# Patient Record
Sex: Female | Born: 1935 | Hispanic: No | Smoking: Never smoker
Health system: Southern US, Community
[De-identification: ages and names within clinical notes are randomized; demographics above are authoritative.]

## PROBLEM LIST (undated history)

## (undated) DIAGNOSIS — G250 Essential tremor: Secondary | ICD-10-CM

## (undated) DIAGNOSIS — E119 Type 2 diabetes mellitus without complications: Secondary | ICD-10-CM

## (undated) DIAGNOSIS — G609 Hereditary and idiopathic neuropathy, unspecified: Secondary | ICD-10-CM

## (undated) DIAGNOSIS — I639 Cerebral infarction, unspecified: Secondary | ICD-10-CM

## (undated) DIAGNOSIS — I10 Essential (primary) hypertension: Secondary | ICD-10-CM

## (undated) DIAGNOSIS — E7800 Pure hypercholesterolemia, unspecified: Secondary | ICD-10-CM

## (undated) DIAGNOSIS — I82409 Acute embolism and thrombosis of unspecified deep veins of unspecified lower extremity: Secondary | ICD-10-CM

## (undated) DIAGNOSIS — E669 Obesity, unspecified: Secondary | ICD-10-CM

## (undated) DIAGNOSIS — F419 Anxiety disorder, unspecified: Secondary | ICD-10-CM

## (undated) DIAGNOSIS — J189 Pneumonia, unspecified organism: Secondary | ICD-10-CM

## (undated) HISTORY — PX: CATARACT EXTRACTION: SUR2

## (undated) HISTORY — DX: Cerebral infarction, unspecified: I63.9

## (undated) HISTORY — DX: Hereditary and idiopathic neuropathy, unspecified: G60.9

## (undated) HISTORY — DX: Obesity, unspecified: E66.9

## (undated) HISTORY — DX: Anxiety disorder, unspecified: F41.9

## (undated) HISTORY — PX: FOOT SURGERY: SHX648

## (undated) HISTORY — DX: Essential tremor: G25.0

## (undated) HISTORY — DX: Type 2 diabetes mellitus without complications: E11.9

---

## 1997-11-25 DIAGNOSIS — E7800 Pure hypercholesterolemia, unspecified: Secondary | ICD-10-CM

## 1997-11-25 HISTORY — DX: Pure hypercholesterolemia, unspecified: E78.00

## 2000-09-24 ENCOUNTER — Encounter: Payer: Self-pay | Admitting: Internal Medicine

## 2000-09-24 ENCOUNTER — Encounter: Admission: RE | Admit: 2000-09-24 | Discharge: 2000-09-24 | Payer: Self-pay | Admitting: Internal Medicine

## 2000-10-02 ENCOUNTER — Other Ambulatory Visit: Admission: RE | Admit: 2000-10-02 | Discharge: 2000-10-02 | Payer: Self-pay | Admitting: Internal Medicine

## 2002-07-02 ENCOUNTER — Encounter: Payer: Self-pay | Admitting: Cardiovascular Disease

## 2002-07-02 ENCOUNTER — Ambulatory Visit (HOSPITAL_COMMUNITY): Admission: RE | Admit: 2002-07-02 | Discharge: 2002-07-02 | Payer: Self-pay | Admitting: Cardiovascular Disease

## 2002-07-05 ENCOUNTER — Encounter: Payer: Self-pay | Admitting: Internal Medicine

## 2002-07-05 ENCOUNTER — Encounter: Admission: RE | Admit: 2002-07-05 | Discharge: 2002-07-05 | Payer: Self-pay | Admitting: Internal Medicine

## 2002-07-14 ENCOUNTER — Encounter: Admission: RE | Admit: 2002-07-14 | Discharge: 2002-07-14 | Payer: Self-pay | Admitting: Internal Medicine

## 2002-07-14 ENCOUNTER — Encounter: Payer: Self-pay | Admitting: Internal Medicine

## 2002-07-16 ENCOUNTER — Ambulatory Visit (HOSPITAL_COMMUNITY): Admission: RE | Admit: 2002-07-16 | Discharge: 2002-07-16 | Payer: Self-pay | Admitting: Cardiovascular Disease

## 2004-03-14 ENCOUNTER — Encounter: Admission: RE | Admit: 2004-03-14 | Discharge: 2004-03-14 | Payer: Self-pay | Admitting: Internal Medicine

## 2004-03-28 ENCOUNTER — Encounter: Admission: RE | Admit: 2004-03-28 | Discharge: 2004-03-28 | Payer: Self-pay | Admitting: Cardiovascular Disease

## 2004-04-18 ENCOUNTER — Inpatient Hospital Stay (HOSPITAL_COMMUNITY): Admission: AD | Admit: 2004-04-18 | Discharge: 2004-04-19 | Payer: Self-pay | Admitting: Cardiovascular Disease

## 2004-05-09 ENCOUNTER — Encounter: Admission: RE | Admit: 2004-05-09 | Discharge: 2004-05-09 | Payer: Self-pay | Admitting: Internal Medicine

## 2005-10-07 ENCOUNTER — Encounter: Admission: RE | Admit: 2005-10-07 | Discharge: 2005-10-07 | Payer: Self-pay | Admitting: Internal Medicine

## 2005-12-01 ENCOUNTER — Emergency Department (HOSPITAL_COMMUNITY): Admission: EM | Admit: 2005-12-01 | Discharge: 2005-12-02 | Payer: Self-pay | Admitting: Emergency Medicine

## 2007-08-26 ENCOUNTER — Emergency Department (HOSPITAL_COMMUNITY): Admission: EM | Admit: 2007-08-26 | Discharge: 2007-08-27 | Payer: Self-pay | Admitting: Emergency Medicine

## 2007-10-06 ENCOUNTER — Encounter: Admission: RE | Admit: 2007-10-06 | Discharge: 2007-10-06 | Payer: Self-pay | Admitting: Internal Medicine

## 2008-07-05 ENCOUNTER — Encounter: Admission: RE | Admit: 2008-07-05 | Discharge: 2008-07-05 | Payer: Self-pay | Admitting: Cardiovascular Disease

## 2008-07-06 ENCOUNTER — Emergency Department (HOSPITAL_COMMUNITY): Admission: EM | Admit: 2008-07-06 | Discharge: 2008-07-06 | Payer: Self-pay | Admitting: Emergency Medicine

## 2008-10-11 ENCOUNTER — Encounter: Admission: RE | Admit: 2008-10-11 | Discharge: 2008-10-11 | Payer: Self-pay | Admitting: Internal Medicine

## 2010-12-08 ENCOUNTER — Encounter (INDEPENDENT_AMBULATORY_CARE_PROVIDER_SITE_OTHER): Payer: Self-pay | Admitting: Emergency Medicine

## 2010-12-08 ENCOUNTER — Inpatient Hospital Stay (HOSPITAL_COMMUNITY)
Admission: EM | Admit: 2010-12-08 | Discharge: 2010-12-10 | Payer: Self-pay | Source: Home / Self Care | Attending: Internal Medicine | Admitting: Internal Medicine

## 2010-12-10 LAB — CBC
HCT: 45.1 % (ref 36.0–46.0)
HCT: 43 % (ref 36.0–46.0)
Hemoglobin: 15.2 g/dL — ABNORMAL HIGH (ref 12.0–15.0)
Hemoglobin: 14.3 g/dL (ref 12.0–15.0)
MCH: 30.8 pg (ref 26.0–34.0)
MCH: 30.2 pg (ref 26.0–34.0)
MCHC: 33.7 g/dL (ref 30.0–36.0)
MCHC: 33.3 g/dL (ref 30.0–36.0)
MCV: 91.5 fL (ref 78.0–100.0)
MCV: 90.9 fL (ref 78.0–100.0)
Platelets: 127 10*3/uL — ABNORMAL LOW (ref 150–400)
Platelets: 123 10*3/uL — ABNORMAL LOW (ref 150–400)
RBC: 4.93 MIL/uL (ref 3.87–5.11)
RBC: 4.73 MIL/uL (ref 3.87–5.11)
RDW: 13.9 % (ref 11.5–15.5)
RDW: 13.8 % (ref 11.5–15.5)
WBC: 7.8 10*3/uL (ref 4.0–10.5)
WBC: 7.2 10*3/uL (ref 4.0–10.5)

## 2010-12-10 LAB — BASIC METABOLIC PANEL
BUN: 23 mg/dL (ref 6–23)
BUN: 20 mg/dL (ref 6–23)
CO2: 29 mEq/L (ref 19–32)
CO2: 29 mEq/L (ref 19–32)
Calcium: 9.3 mg/dL (ref 8.4–10.5)
Calcium: 8.8 mg/dL (ref 8.4–10.5)
Chloride: 103 mEq/L (ref 96–112)
Chloride: 104 mEq/L (ref 96–112)
Creatinine, Ser: 0.86 mg/dL (ref 0.4–1.2)
Creatinine, Ser: 0.88 mg/dL (ref 0.4–1.2)
GFR calc Af Amer: >60 mL/min (ref >60)
GFR calc Af Amer: >60 mL/min (ref >60)
GFR calc non Af Amer: >60 mL/min (ref >60)
GFR calc non Af Amer: >60 mL/min (ref >60)
Glucose, Bld: 107 mg/dL — ABNORMAL HIGH (ref 70–99)
Glucose, Bld: 98 mg/dL (ref 70–99)
Potassium: 4.3 mEq/L (ref 3.5–5.1)
Potassium: 4 mEq/L (ref 3.5–5.1)
Sodium: 140 mEq/L (ref 135–145)
Sodium: 140 mEq/L (ref 135–145)

## 2010-12-10 LAB — POCT I-STAT, CHEM 8
BUN: 26 mg/dL — ABNORMAL HIGH (ref 6–23)
Calcium, Ion: 1.14 mmol/L (ref 1.12–1.32)
Chloride: 104 mEq/L (ref 96–112)
Creatinine, Ser: 1.1 mg/dL (ref 0.4–1.2)
Glucose, Bld: 103 mg/dL — ABNORMAL HIGH (ref 70–99)
HCT: 46 % (ref 36.0–46.0)
Hemoglobin: 15.6 g/dL — ABNORMAL HIGH (ref 12.0–15.0)
Potassium: 4.2 mEq/L (ref 3.5–5.1)
Sodium: 140 mEq/L (ref 135–145)
TCO2: 32 mmol/L (ref 0–100)

## 2010-12-10 LAB — APTT: aPTT: 28 seconds (ref 24–37)

## 2010-12-10 LAB — PROTIME-INR
INR: 1.05 (ref 0.00–1.49)
INR: 1.12 (ref 0.00–1.49)
Prothrombin Time: 13.9 seconds (ref 11.6–15.2)
Prothrombin Time: 14.6 seconds (ref 11.6–15.2)

## 2010-12-10 LAB — DIFFERENTIAL
Basophils Absolute: 0.1 10*3/uL (ref 0.0–0.1)
Basophils Relative: 1 % (ref 0–1)
Eosinophils Absolute: 0.1 10*3/uL (ref 0.0–0.7)
Eosinophils Relative: 2 % (ref 0–5)
Lymphocytes Relative: 37 % (ref 12–46)
Lymphs Abs: 2.8 10*3/uL (ref 0.7–4.0)
Monocytes Absolute: 0.6 10*3/uL (ref 0.1–1.0)
Monocytes Relative: 8 % (ref 3–12)
Neutro Abs: 4.1 10*3/uL (ref 1.7–7.7)
Neutrophils Relative %: 53 % (ref 43–77)

## 2010-12-10 LAB — HOMOCYSTEINE: Homocysteine: 11.8 umol/L (ref 4.0–15.4)

## 2010-12-12 LAB — PROTEIN C ACTIVITY: Protein C Activity: 136 % — ABNORMAL HIGH (ref 75–133)

## 2010-12-12 LAB — LUPUS ANTICOAGULANT PANEL
DRVVT: 40.1 secs (ref 36.2–44.3)
Lupus Anticoagulant: NOT DETECTED
PTT Lupus Anticoagulant: 33.3 secs (ref 30.0–45.6)

## 2010-12-12 LAB — BETA-2-GLYCOPROTEIN I ABS, IGG/M/A
Beta-2 Glyco I IgG: 0 G Units (ref <20)
Beta-2-Glycoprotein I IgA: 9 A Units (ref <20)
Beta-2-Glycoprotein I IgM: 6 M Units (ref <20)

## 2010-12-12 LAB — CARDIOLIPIN ANTIBODIES, IGG, IGM, IGA
Anticardiolipin IgA: 3 APL U/mL — ABNORMAL LOW (ref <22)
Anticardiolipin IgG: 2 GPL U/mL — ABNORMAL LOW (ref <23)
Anticardiolipin IgM: 4 MPL U/mL — ABNORMAL LOW (ref <11)

## 2010-12-12 LAB — PROTIME-INR
INR: 1.17 (ref 0.00–1.49)
Prothrombin Time: 15.1 seconds (ref 11.6–15.2)

## 2010-12-12 LAB — ANTITHROMBIN III: AntiThromb III Func: 93 % (ref 76–126)

## 2010-12-12 LAB — PROTEIN S ACTIVITY: Protein S Activity: 72 % (ref 69–129)

## 2010-12-14 ENCOUNTER — Encounter
Admission: RE | Admit: 2010-12-14 | Discharge: 2010-12-14 | Payer: Self-pay | Source: Home / Self Care | Attending: Family Medicine | Admitting: Family Medicine

## 2010-12-17 LAB — FACTOR 5 LEIDEN

## 2011-03-08 ENCOUNTER — Encounter (HOSPITAL_COMMUNITY): Payer: Medicare Other | Attending: Rheumatology

## 2011-03-08 DIAGNOSIS — M810 Age-related osteoporosis without current pathological fracture: Secondary | ICD-10-CM | POA: Insufficient documentation

## 2011-04-12 NOTE — Cardiovascular Report (Signed)
   [name] [name], [name] [name]                        ACCOUNT NO.:  [id number]   MEDICAL RECORD NO.:  [id number]                   PATIENT TYPE:  OIB   LOCATION:  2899                                 FACILITY:  [facility]   PHYSICIAN:  Ricki Rodriguez, M.D.               DATE OF BIRTH:  1930/02/24   DATE OF PROCEDURE:  07/16/2002  DATE OF DISCHARGE:  07/16/2002                              CARDIAC CATHETERIZATION   PROCEDURE PERFORMED:  1. Left heart catheterization.  2. Selective coronary angiography.  3. Left ventriculography.  4. Descending renal aortography.   INDICATIONS FOR PROCEDURE:  A 77 year old middle [place] female with chest  pain and an abnormal nuclear stress test.   APPROACH:  Right femoral artery with a 6 French femoral diagnostic  catheters.   COMPLICATIONS:  None; however, SMART needle was used.   HEMODYNAMIC DATA:  1. The left ventricular pressure was 144/13 mmHg.  2. The aortic pressure 142 /59 mmHg.   CORONARY ANGIOGRAPHY:  1. The main coronary artery was short and unremarkable.  2. The left anterior descending coronary artery had limited irregularities     and had a small diagonal vessel with ostial 50% disease.  3. The left circumflex coronary artery was dominant and had luminal     irregularities.  The obtuse marginal branch one had ostial 60% stenosis;     however, it was less than 1 mm in diameter.  The obtuse marginal branch     two had ostial and proximal 20-30% stenosis.  4. The posterior descending coronary artery was unremarkable.  5. The right coronary artery was small with luminal irregularities.   LEFT VENTRICULOGRAPHY:  The left ventriculogram showed normal left  ventricular systolic function.  The ascending aorta looked somewhat ectatic.   RENAL AORTOGRAPHY:  The aortogram showed a normal renal arteries with  minimal distal aortic disease and proximal common iliac with 30% narrowing.   IMPRESSION:  1. Multivessel coronary artery disease  (mild).  2.     Normal left ventricular systolic function.  3. Normal renal arteries.   RECOMMENDATIONS:  This patient will be treated medically with Ricki Rodriguez, M.D.                                                Ricki Rodriguez, M.D.    ASK/MEDQ  D:  07/16/2002  T:  07/19/2002  Job:  [id number]   cc:   Cardiac Cath Lab

## 2011-04-12 NOTE — Discharge Summary (Signed)
[name] [name], [name] [name]                        ACCOUNT NO.:  [id number]   MEDICAL RECORD NO.:  [id number]                   PATIENT TYPE:  INP   LOCATION:  3706                                 FACILITY:  [facility]   PHYSICIAN:  Ricki Rodriguez, M.D.               DATE OF BIRTH:  Oct 26, 1930   DATE OF ADMISSION:  04/18/2004  DATE OF DISCHARGE:  04/19/2004                                 DISCHARGE SUMMARY   PRINCIPAL/FINAL DIAGNOSES:  1. Coronary atherosclerosis of native vessel.  2. Angina pectoris.  3. Hypertension.  4. Obesity.   PRINCIPAL PROCEDURE:  Left heart catheterization, selective coronary  angiography, left ventricular __________ done by  Dr. Orpah Cobb on Apr 19, 2004.   DISCHARGE MEDICATIONS:  The patient is to continue:  1. Aspirin 81 mg one p.o. daily.  2. Actonel 35 mg one p.o. daily.  3. Xanax 0.25 mg one twice daily.  4. Sonata 10 mg one daily.  5. Lipitor 20 mg one p.o. daily.  6. Tenormin 50 mg in the morning and 25 mg in the evening.  7. Vicodin 5/500 one twice daily.  8. Heating pad to lower extremity.   PAIN MANAGEMENT:  Use Vicodin as directed.   ACTIVITY:  As tolerated but sedentary for 2 days, then increase activity  gradually.   DIET:  Low fat, low salt diet.   WOUND CARE:  The patient to notify for right groin pain, swelling, or  discharge.   FOLLOW UP:  Follow up by Dr. Orpah Cobb in 2 weeks. The patient is to call  [phone] for appointment.   HISTORY:  This 77 year old [place] female had an episode chest pain  radiating to left arm along with shortness of breath and sweating spell.  Chest x-ray lasted for a few minutes. She had a past medical history of  hypertension, elevated cholesterol.   PHYSICAL EXAMINATION:  VITAL SIGNS:  Temperature 98.6, pulse 86,  respirations 20, blood pressure 130/65, height 5 feet 1 inch, weight 184  pounds.  GENERAL:  The patient was alert and oriented x3.  HEENT:  Head:  Normocephalic, atraumatic. Eyes:   Manson Passey. Pupils are equal,  round, and reactive to light. Extraocular movements intact. Conjunctivae  pink. Sclerae white. Ears, nose, and throat:  Mucous membranes are pink and  moist.  NECK:  No JVD. No carotid bruit.  LUNGS:  Clear bilaterally.  HEART:  Normal S1 and S2.  ABDOMEN:  Soft and nontender.  EXTREMITIES:  No edema, cyanosis, or clubbing.  CENTRAL NERVOUS SYSTEM:  Bilateral equal grips. The patient was right handed  and moved all four extremities.   LABORATORY DATA:  Hemoglobin 15.5, hematocrit 45.5, normal white blood cell  count and platelet count. Normal PT/PTT. Normal electrolytes, BUN and  creatinine. Sugar elevated at 151. EKG:  Normal sinus rhythm. Cardiac  catheterization showed mild multivessel coronary artery disease with a  normal left systolic function.  HOSPITAL COURSE:  The patient was admitted to telemetry unit. Myocardial  infarction was ruled out. She underwent cardiac catheterization that showed  mild multivessel coronary artery disease with normal left systolic function.  Hence, she was discharged home on Apr 19, 2004 in satisfactory condition  with follow up by me in 2 weeks.                                                Ricki Rodriguez, M.D.    ASK/MEDQ  D:  05/31/2004  T:  06/01/2004  Job:  [id number]

## 2011-04-12 NOTE — Cardiovascular Report (Signed)
[name], [name]                        ACCOUNT NO.:  [id number]   MEDICAL RECORD NO.:  [id number]                   PATIENT TYPE:  INP   LOCATION:  3706                                 FACILITY:  [facility]   PHYSICIAN:  Ricki Rodriguez, M.D.               DATE OF BIRTH:  17-Jan-1930   DATE OF PROCEDURE:  04/19/2004  DATE OF DISCHARGE:                              CARDIAC CATHETERIZATION   PROCEDURES PERFORMED:  1. Left heart catheterization.  2. Selective coronary angiography.  3. Left ventricular function study.   CARDIOLOGIST:  Ricki Rodriguez, M.D.   ACCESS:  Approach; right femoral artery using a 5 French sheath with no  complications and a Smart needle was used for arterial access.  No hematoma  postprocedure.   CONTRAST MATERIAL:  Less than 70 mL of dye used.   INDICATIONS:  This is a 77 year old [place] female with known coronary  artery disease who had recurrent chest pain.   HEMODYNAMIC DATA:  The left ventricular pressure was 130/50 and the aortic  pressure was 133/70.   VENTRICULOGRAPHIC DATA:  Left Ventriculogram:  The left ventriculogram  showed normal left ventricular systolic function with an ejection fraction  of 65%.   ANGIOGRAPHIC DATA:  Coronary Anatomy  Left Main Coronary Artery:  The left main coronary artery was short and  unremarkable.   Left Anterior Descending Coronary Artery:  The left anterior descending  coronary artery had mild proximal-to-mid 20-30% disease.  The rest of the  vessel was unremarkable.  It wrapped around the physical examination of the  heart.  The diagonal vessels were small vessels.   Left Circumflex Coronary Artery:  The left circumflex coronary artery was  also essentially unremarkable. Its ramus and obtuse marginal branch-1 and 3  were small vessels; and, the obtuse marginal branch-2 was unremarkable.  The  posterior descending coronary artery was relatively short and unremarkable.   Right Coronary Artery:  The right  coronary artery was nondominant and had  minimal diffuse disease in the right coronary artery as well as the marginal  branch.   IMPRESSION:  1. Mild multivessel native vessel coronary artery disease.  2. Normal left ventricular systolic function.   RECOMMENDATIONS:  The patient will be treated medically for noncardiac chest  pain.                                               Ricki Rodriguez, M.D.    ASK/MEDQ  D:  04/19/2004  T:  04/20/2004  Job:  [id number]

## 2011-08-23 LAB — POCT I-STAT, CHEM 8
Creatinine, Ser: 1
Glucose, Bld: 96
Hemoglobin: 15.6 — ABNORMAL HIGH
TCO2: 26

## 2011-08-23 LAB — DIFFERENTIAL
Eosinophils Absolute: 0.1
Eosinophils Relative: 1
Lymphs Abs: 2.6
Monocytes Absolute: 0.6
Monocytes Relative: 8
Neutrophils Relative %: 52

## 2011-08-23 LAB — URINALYSIS, ROUTINE W REFLEX MICROSCOPIC
Specific Gravity, Urine: 1.025
Urobilinogen, UA: 0.2
pH: 5.5

## 2011-08-23 LAB — CBC
Hemoglobin: 14.9
Platelets: 124 — ABNORMAL LOW
RBC: 5.04
RDW: 14.3

## 2011-08-23 LAB — URINE CULTURE: Colony Count: 45000

## 2011-09-05 LAB — CULTURE, BLOOD (ROUTINE X 2): Culture: NO GROWTH

## 2011-09-05 LAB — URINE MICROSCOPIC-ADD ON

## 2011-09-05 LAB — DIFFERENTIAL
Basophils Absolute: 0
Basophils Relative: 1
Lymphocytes Relative: 31
Neutro Abs: 4.8
Neutrophils Relative %: 60

## 2011-09-05 LAB — BASIC METABOLIC PANEL
BUN: 11
Calcium: 8.8
Creatinine, Ser: 0.74
GFR calc non Af Amer: >60
Glucose, Bld: 88
Sodium: 135

## 2011-09-05 LAB — URINALYSIS, ROUTINE W REFLEX MICROSCOPIC: Nitrite: NEGATIVE

## 2011-09-05 LAB — CBC
Platelets: 180
RDW: 12.8

## 2012-03-14 ENCOUNTER — Emergency Department (HOSPITAL_COMMUNITY)
Admission: EM | Admit: 2012-03-14 | Discharge: 2012-03-14 | Disposition: A | Discharge status: Home / Self Care | Payer: Medicare Other | Attending: Emergency Medicine | Admitting: Emergency Medicine

## 2012-03-14 DIAGNOSIS — F410 Panic disorder [episodic paroxysmal anxiety] without agoraphobia: Secondary | ICD-10-CM | POA: Insufficient documentation

## 2012-03-14 MED ORDER — LORAZEPAM 2 MG/ML IJ SOLN
1.0000 mg | Freq: Once | INTRAMUSCULAR | Status: AC
  Administered 2012-03-14: 1 mg via INTRAVENOUS
  Filled 2012-03-14: qty 1

## 2012-03-14 MED ORDER — LORAZEPAM 0.5 MG PO TABS: 0.5000 mg | ORAL_TABLET | Freq: Three times a day (TID) | ORAL | Status: AC | PRN

## 2012-03-14 NOTE — ED Notes (Signed)
Patient brought in via [facility] she is brought in with complaints of anxiety and a panic attack after seeing her son bleeding this morning. EMS personal advises that her son was drug by a dog this morning and was bleeding from abrasion and when she saw her son she had a panic attack. Alert and oriented she is currently having tremors.

## 2012-03-14 NOTE — ED Notes (Signed)
Discharge with her son with instructions discussed. NAD noted at the time of discharge. VS stable.

## 2012-03-14 NOTE — Discharge Instructions (Signed)
Anxiety and Panic Attacks Your caregiver has informed you that you are having an anxiety or panic attack. There may be many forms of this. Most of the time these attacks come suddenly and without warning. They come at any time of day, including periods of sleep, and at any time of life. They may be strong and unexplained. Although panic attacks are very scary, they are physically harmless. Sometimes the cause of your anxiety is not known. Anxiety is a protective mechanism of the body in its fight or flight mechanism. Most of these perceived danger situations are actually nonphysical situations (such as anxiety over losing a job). CAUSES  The causes of an anxiety or panic attack are many. Panic attacks may occur in otherwise healthy people given a certain set of circumstances. There may be a genetic cause for panic attacks. Some medications may also have anxiety as a side effect. SYMPTOMS  Some of the most common feelings are:  Intense terror.   Dizziness, feeling faint.   Hot and cold flashes.   Fear of going crazy.   Feelings that nothing is real.   Sweating.   Shaking.   Chest pain or a fast heartbeat (palpitations).   Smothering, choking sensations.   Feelings of impending doom and that death is near.   Tingling of extremities, this may be from over-breathing.   Altered reality (derealization).   Being detached from yourself (depersonalization).  Several symptoms can be present to make up anxiety or panic attacks. DIAGNOSIS  The evaluation by your caregiver will depend on the type of symptoms you are experiencing. The diagnosis of anxiety or panic attack is made when no physical illness can be determined to be a cause of the symptoms. TREATMENT  Treatment to prevent anxiety and panic attacks may include:  Avoidance of circumstances that cause anxiety.   Reassurance and relaxation.   Regular exercise.   Relaxation therapies, such as yoga.   Psychotherapy with a  psychiatrist or therapist.   Avoidance of caffeine, alcohol and illegal drugs.   Prescribed medication.  SEEK IMMEDIATE MEDICAL CARE IF:   You experience panic attack symptoms that are different than your usual symptoms.   You have any worsening or concerning symptoms.  Document Released: 11/11/2005 Document Revised: 10/31/2011 Document Reviewed: 03/15/2010 [employer] Patient Information 2012 [employer]  Lorazepam tablets What is this medicine? LORAZEPAM (lor A ze pam) is a benzodiazepine. It is used to treat anxiety. This medicine may be used for other purposes; ask your health care provider or pharmacist if you have questions. What should I tell my health care provider before I take this medicine? They need to know if you have any of these conditions: -alcohol or drug abuse problem -bipolar disorder, depression, psychosis or other mental health condition -glaucoma -kidney or liver disease -lung disease or breathing difficulties -myasthenia gravis -Parkinson's disease -seizures or a history of seizures -suicidal thoughts -an unusual or allergic reaction to lorazepam, other benzodiazepines, foods, dyes, or preservatives -pregnant or trying to get pregnant -breast-feeding How should I use this medicine? Take this medicine by mouth with a glass of water. Follow the directions on the prescription label. If it upsets your stomach, take it with food or milk. Take your medicine at regular intervals. Do not take it more often than directed. Do not stop taking except on the advice of your doctor or health care professional. Talk to your pediatrician regarding the use of this medicine in children. Special care may be needed. Overdosage: If you   think you have taken too much of this medicine contact a poison control center or emergency room at once. NOTE: This medicine is only for you. Do not share this medicine with others. What if I miss a dose? If you miss a dose, take it as soon as  you can. If it is almost time for your next dose, take only that dose. Do not take double or extra doses. What may interact with this medicine? -barbiturate medicines for inducing sleep or treating seizures, like phenobarbital -clozapine -medicines for depression, mental problems or psychiatric disturbances -medicines for sleep -phenytoin -probenecid -theophylline -valproic acid This list may not describe all possible interactions. Give your health care provider a list of all the medicines, herbs, non-prescription drugs, or dietary supplements you use. Also tell them if you smoke, drink alcohol, or use illegal drugs. Some items may interact with your medicine. What should I watch for while using this medicine? Visit your doctor or health care professional for regular checks on your progress. Your body may become dependent on this medicine, ask your doctor or health care professional if you still need to take it. However, if you have been taking this medicine regularly for some time, do not suddenly stop taking it. You must gradually reduce the dose or you may get severe side effects. Ask your doctor or health care professional for advice before increasing or decreasing the dose. Even after you stop taking this medicine it can still affect your body for several days. You may get drowsy or dizzy. Do not drive, use machinery, or do anything that needs mental alertness until you know how this medicine affects you. To reduce the risk of dizzy and fainting spells, do not stand or sit up quickly, especially if you are an older patient. Alcohol may increase dizziness and drowsiness. Avoid alcoholic drinks. Do not treat yourself for coughs, colds or allergies without asking your doctor or health care professional for advice. Some ingredients can increase possible side effects. What side effects may I notice from receiving this medicine? Side effects that you should report to your doctor or health care  professional as soon as possible: -changes in vision -confusion -depression -mood changes, excitability or aggressive behavior -movement difficulty, staggering or jerky movements -muscle cramps -restlessness -weakness or tiredness Side effects that usually do not require medical attention (report to your doctor or health care professional if they continue or are bothersome): -constipation or diarrhea -difficulty sleeping, nightmares -dizziness, drowsiness -headache -nausea, vomiting This list may not describe all possible side effects. Call your doctor for medical advice about side effects. You may report side effects to FDA at 1-800-FDA-1088. Where should I keep my medicine? Keep out of the reach of children. This medicine can be abused. Keep your medicine in a safe place to protect it from theft. Do not share this medicine with anyone. Selling or giving away this medicine is dangerous and against the law. Store at room temperature between 20 and 25 degrees C (68 and 77 degrees F). Protect from light. Keep container tightly closed. Throw away any unused medicine after the expiration date. NOTE: This sheet is a summary. It may not cover all possible information. If you have questions about this medicine, talk to your doctor, pharmacist, or health care provider.  2012, [employer]. (05/13/2008 2:58:20 PM) 

## 2012-03-14 NOTE — ED Provider Notes (Signed)
History     CSN: [id number]  Arrival date & time 03/14/12  0820   First MD Initiated Contact with Patient 03/14/12 [phone]      Chief Complaint  Patient presents with  . Panic Attack    (Consider location/radiation/quality/duration/timing/severity/associated sxs/prior treatment) The history is provided by the patient and the EMS personnel. The history is limited by the condition of the patient (anxiety).   78 year old female was brought in by ambulance after becoming very upset when she saw her son and drug by a dog and bleeding from abrasions. She is only able to speak in one or 2 words sentences. She is having difficulty answering even simple questions making further history very difficult to obtain. She denies chest pain and dyspnea.  No past medical history on file.  No past surgical history on file.  No family history on file.  History  Substance Use Topics  . Smoking status: Not on file  . Smokeless tobacco: Not on file  . Alcohol Use: Not on file    OB History    No data available      Review of Systems  Unable to perform ROS: Other    Allergies  Review of patient's allergies indicates no known allergies.  Home Medications   Current Outpatient Rx  Name Route Sig Dispense Refill  . ALPRAZOLAM 0.25 MG PO TABS Oral Take 0.25 mg by mouth 2 (two) times daily.    . ASPIRIN 81 MG PO TABS Oral Take 81 mg by mouth daily.    . ATENOLOL 50 MG PO TABS Oral Take 25 mg by mouth 2 (two) times daily.    . ATORVASTATIN CALCIUM 20 MG PO TABS Oral Take 20 mg by mouth daily.    [name] DILTIAZEM HCL 30 MG PO TABS Oral Take 30 mg by mouth 2 (two) times daily.    . FUROSEMIDE 20 MG PO TABS Oral Take 20 mg by mouth daily.    [name] GABAPENTIN 100 MG PO CAPS Oral Take 100 mg by mouth every evening.    [name] POTASSIUM CHLORIDE CRYS ER 10 MEQ PO TBCR Oral Take 10 mEq by mouth daily.    [name] PRIMIDONE 250 MG PO TABS Oral Take 125 mg by mouth at bedtime.    . TRAMADOL HCL 50 MG PO TABS Oral Take 50 mg  by mouth at bedtime.    . WARFARIN SODIUM 5 MG PO TABS Oral Take 5 mg by mouth daily.      BP 148/61  Pulse 73  Temp(Src) 97.8 F (36.6 C) (Oral)  Resp 18  SpO2 100%  Physical Exam  Nursing note and vitals reviewed.  78 year old female who appears for anxious but in no acute distress. She is having intermittent tremors which are worse on the right side. Vital signs are normal except for mild hypertension with blood pressure 148/61. Oxygen saturation is 98% which is normal. Head is normocephalic and atraumatic. PERRLA, EOMI. Oropharynx is clear. Neck is nontender and supple. Back is nontender. Lungs are clear without rales, wheezes, or rhonchi. Heart has regular rate and rhythm without murmur. Abdomen is soft, flat, nontender without masses or hepatosplenomegaly. Extremities have no cyanosis or edema, full range of motion is present. Skin is warm and dry without rash. Neurologic: Cranial nerves are intact, there no focal motor or sensory deficits. Intermittent tremor is present which is worse on the right. Psychiatric is significant for anxiety.  ED Course  Procedures (including critical care time)  She was given a  dose of lorazepam and felt much better although she noted that she was sleepy. He'll be discharged with a prescription for lorazepam but at a lower dose than what she was given in the emergency department.  1. Panic attack       MDM  Anxiety/panic attack. She will be given dose of Ativan and observed.        Dione Booze, MD 03/14/12 1031

## 2012-03-18 ENCOUNTER — Other Ambulatory Visit (HOSPITAL_COMMUNITY): Payer: Self-pay | Admitting: *Deleted

## 2012-03-19 ENCOUNTER — Encounter (HOSPITAL_COMMUNITY)
Admission: RE | Admit: 2012-03-19 | Discharge: 2012-03-19 | Disposition: A | Discharge status: Home / Self Care | Payer: Medicare Other | Source: Ambulatory Visit | Attending: Rheumatology | Admitting: Rheumatology

## 2012-03-19 DIAGNOSIS — M810 Age-related osteoporosis without current pathological fracture: Secondary | ICD-10-CM | POA: Insufficient documentation

## 2012-03-19 MED ORDER — ZOLEDRONIC ACID 5 MG/100ML IV SOLN
5.0000 mg | Freq: Once | INTRAVENOUS | Status: AC
  Administered 2012-03-19: 5 mg via INTRAVENOUS
  Filled 2012-03-19: qty 100

## 2012-03-20 ENCOUNTER — Encounter (HOSPITAL_COMMUNITY): Payer: Medicare Other

## 2012-03-23 ENCOUNTER — Emergency Department (HOSPITAL_COMMUNITY)
Admission: EM | Admit: 2012-03-23 | Discharge: 2012-03-24 | Disposition: A | Discharge status: Home / Self Care | Payer: Medicare Other | Attending: Emergency Medicine | Admitting: Emergency Medicine

## 2012-03-23 ENCOUNTER — Encounter (HOSPITAL_COMMUNITY): Payer: Self-pay | Admitting: Emergency Medicine

## 2012-03-23 DIAGNOSIS — M546 Pain in thoracic spine: Secondary | ICD-10-CM | POA: Insufficient documentation

## 2012-03-23 DIAGNOSIS — Z7982 Long term (current) use of aspirin: Secondary | ICD-10-CM | POA: Insufficient documentation

## 2012-03-23 DIAGNOSIS — J189 Pneumonia, unspecified organism: Secondary | ICD-10-CM | POA: Insufficient documentation

## 2012-03-23 DIAGNOSIS — M545 Low back pain, unspecified: Secondary | ICD-10-CM | POA: Insufficient documentation

## 2012-03-23 DIAGNOSIS — R0602 Shortness of breath: Secondary | ICD-10-CM | POA: Insufficient documentation

## 2012-03-23 DIAGNOSIS — R05 Cough: Secondary | ICD-10-CM | POA: Insufficient documentation

## 2012-03-23 DIAGNOSIS — I10 Essential (primary) hypertension: Secondary | ICD-10-CM | POA: Insufficient documentation

## 2012-03-23 DIAGNOSIS — R059 Cough, unspecified: Secondary | ICD-10-CM | POA: Insufficient documentation

## 2012-03-23 DIAGNOSIS — R6883 Chills (without fever): Secondary | ICD-10-CM | POA: Insufficient documentation

## 2012-03-23 DIAGNOSIS — E789 Disorder of lipoprotein metabolism, unspecified: Secondary | ICD-10-CM | POA: Insufficient documentation

## 2012-03-23 DIAGNOSIS — M549 Dorsalgia, unspecified: Secondary | ICD-10-CM

## 2012-03-23 DIAGNOSIS — Z86718 Personal history of other venous thrombosis and embolism: Secondary | ICD-10-CM | POA: Insufficient documentation

## 2012-03-23 DIAGNOSIS — R079 Chest pain, unspecified: Secondary | ICD-10-CM | POA: Insufficient documentation

## 2012-03-23 DIAGNOSIS — Z79899 Other long term (current) drug therapy: Secondary | ICD-10-CM | POA: Insufficient documentation

## 2012-03-23 DIAGNOSIS — Z7901 Long term (current) use of anticoagulants: Secondary | ICD-10-CM | POA: Insufficient documentation

## 2012-03-23 DIAGNOSIS — R109 Unspecified abdominal pain: Secondary | ICD-10-CM | POA: Insufficient documentation

## 2012-03-23 HISTORY — DX: Pure hypercholesterolemia, unspecified: E78.00

## 2012-03-23 HISTORY — DX: Pneumonia, unspecified organism: J18.9

## 2012-03-23 HISTORY — DX: Essential (primary) hypertension: I10

## 2012-03-23 HISTORY — DX: Acute embolism and thrombosis of unspecified deep veins of unspecified lower extremity: I82.409

## 2012-03-23 NOTE — ED Notes (Signed)
Family states for the past couple days she has been shaky like she has a cold or something and she has been c/o pain in her left flank area  Pt states when she wakes up the pain is sharp in nature  Pt states nothing makes the pain better or worse

## 2012-03-24 ENCOUNTER — Emergency Department (HOSPITAL_COMMUNITY): Payer: Medicare Other

## 2012-03-24 LAB — PROTIME-INR: INR: 2.35 — ABNORMAL HIGH (ref 0.00–1.49)

## 2012-03-24 LAB — CBC
HCT: 45.9 % (ref 36.0–46.0)
Hemoglobin: 15.3 g/dL — ABNORMAL HIGH (ref 12.0–15.0)
MCH: 29.9 pg (ref 26.0–34.0)
MCHC: 33.3 g/dL (ref 30.0–36.0)
RBC: 5.12 MIL/uL — ABNORMAL HIGH (ref 3.87–5.11)

## 2012-03-24 LAB — BASIC METABOLIC PANEL
BUN: 13 mg/dL (ref 6–23)
CO2: 28 mEq/L (ref 19–32)
Calcium: 8.6 mg/dL (ref 8.4–10.5)
GFR calc non Af Amer: 64 mL/min — ABNORMAL LOW (ref >90)
Glucose, Bld: 183 mg/dL — ABNORMAL HIGH (ref 70–99)
Potassium: 3.9 mEq/L (ref 3.5–5.1)

## 2012-03-24 LAB — TROPONIN I: Troponin I: <0.3 ng/mL (ref <0.30)

## 2012-03-24 MED ORDER — MOXIFLOXACIN HCL 400 MG PO TABS
400.0000 mg | ORAL_TABLET | Freq: Once | ORAL | Status: AC
  Administered 2012-03-24: 400 mg via ORAL
  Filled 2012-03-24: qty 1

## 2012-03-24 MED ORDER — OXYCODONE-ACETAMINOPHEN 5-325 MG PO TABS: 1.0000 | ORAL_TABLET | ORAL | Status: AC | PRN

## 2012-03-24 MED ORDER — MORPHINE SULFATE 4 MG/ML IJ SOLN
6.0000 mg | Freq: Once | INTRAMUSCULAR | Status: AC
  Administered 2012-03-24: 6 mg via INTRAVENOUS
  Filled 2012-03-24: qty 2

## 2012-03-24 MED ORDER — MOXIFLOXACIN HCL 400 MG PO TABS: 400.0000 mg | ORAL_TABLET | Freq: Every day | ORAL | Status: AC

## 2012-03-24 MED ORDER — OXYCODONE-ACETAMINOPHEN 5-325 MG PO TABS
1.0000 | ORAL_TABLET | Freq: Once | ORAL | Status: AC
  Administered 2012-03-24: 1 via ORAL
  Filled 2012-03-24: qty 1

## 2012-03-24 NOTE — ED Provider Notes (Signed)
History     CSN: [id number]  Arrival date & time 03/23/12  2158   First MD Initiated Contact with Patient 03/24/12 0028      Chief Complaint  Patient presents with  . Flank Pain    (Consider location/radiation/quality/duration/timing/severity/associated sxs/prior treatment) The history is provided by the patient.   the patient reports pain and discomfort in her left upper back.  She also reports cough and chills over the past several days.  She has a shortness of breath at this time.  She denies new rash.  She denies unilateral leg swelling.  She has a history of DVT and is currently on Coumadin.  There is no pleuritic component to her pain.  Her symptoms are worsened by movement and palpation.  Nothing improves her pain.  She has not tried any pain medication at home as she is afraid to try medications given her Coumadin use.  Her pain is mild to moderate at this time  Past Medical History  Diagnosis Date  . Hypertension   . High cholesterol   . DVT (deep venous thrombosis)   . Pneumonia     Past Surgical History  Procedure Date  . Foot surgery     Family History  Problem Relation Age of Onset  . Coronary artery disease Other     History  Substance Use Topics  . Smoking status: Never Smoker   . Smokeless tobacco: Not on file  . Alcohol Use: No    OB History    Grav Para Term Preterm Abortions TAB SAB Ect Mult Living                  Review of Systems  All other systems reviewed and are negative.    Allergies  Review of patient's allergies indicates no known allergies.  Home Medications   Current Outpatient Rx  Name Route Sig Dispense Refill  . ALPRAZOLAM 0.25 MG PO TABS Oral Take 0.25 mg by mouth 2 (two) times daily.    . ASPIRIN 81 MG PO TABS Oral Take 81 mg by mouth daily.    . ATENOLOL 50 MG PO TABS Oral Take 25 mg by mouth 2 (two) times daily.    . ATORVASTATIN CALCIUM 20 MG PO TABS Oral Take 20 mg by mouth daily.    [name] DILTIAZEM HCL 30 MG PO TABS  Oral Take 30 mg by mouth 2 (two) times daily.    . FUROSEMIDE 20 MG PO TABS Oral Take 20 mg by mouth daily.    [name] GABAPENTIN 100 MG PO CAPS Oral Take 100 mg by mouth every evening.    [name] LORAZEPAM 0.5 MG PO TABS Oral Take 1 tablet (0.5 mg total) by mouth 3 (three) times daily as needed for anxiety. 15 tablet 0  . POTASSIUM CHLORIDE CRYS ER 10 MEQ PO TBCR Oral Take 10 mEq by mouth daily.    [name] PRIMIDONE 250 MG PO TABS Oral Take 125 mg by mouth at bedtime.    . TRAMADOL HCL 50 MG PO TABS Oral Take 50 mg by mouth at bedtime.    . WARFARIN SODIUM 5 MG PO TABS Oral Take 5 mg by mouth daily.      BP 125/52  Pulse 73  Temp(Src) 97.6 F (36.4 C) (Oral)  Resp 16  SpO2 97%  Physical Exam  Nursing note and vitals reviewed. Constitutional: She is oriented to person, place, and time. She appears well-developed and well-nourished. No distress.  HENT:  Head: Normocephalic and  atraumatic.  Eyes: EOM are normal.  Neck: Normal range of motion.  Cardiovascular: Normal rate, regular rhythm and normal heart sounds.   Pulmonary/Chest: Effort normal and breath sounds normal.  Abdominal: Soft. She exhibits no distension. There is no tenderness.  Musculoskeletal: Normal range of motion.       Mild tenderness of left parathoracic and paralumbar regions.  No evidence of zoster.  No erythema.  No ecchymosis.  Neurological: She is alert and oriented to person, place, and time.  Skin: Skin is warm and dry.  Psychiatric: She has a normal mood and affect. Judgment normal.    ED Course  Procedures (including critical care time)  Labs Reviewed  CBC - Abnormal; Notable for the following:    RBC 5.12 (*)    Hemoglobin 15.3 (*)    Platelets 132 (*)    All other components within normal limits  BASIC METABOLIC PANEL - Abnormal; Notable for the following:    Glucose, Bld 183 (*)    GFR calc non Af Amer 64 (*)    GFR calc Af Amer 75 (*)    All other components within normal limits  PROTIME-INR - Abnormal;  Notable for the following:    Prothrombin Time 26.1 (*)    INR 2.35 (*)    All other components within normal limits  TROPONIN I  URINALYSIS, WITH MICROSCOPIC   Dg Chest 2 View  03/24/2012  *RADIOLOGY REPORT*  Clinical Data: Upper back pain radiating to the chest.  CHEST - 2 VIEW  Comparison: 12/14/2010  Findings: Cardiomegaly.  Central vascular congestion.  Aortic arch atherosclerosis.  Mild retrocardiac opacities.  Mild interstitial prominence.  No pneumothorax.  No pleural effusion.  Multilevel degenerative changes.  No acute osseous abnormality identified.  IMPRESSION: Cardiomegaly with mild interstitial prominence.  Mild retrocardiac opacity; atelectasis versus infiltrate.  Original Report Authenticated By: Waneta Martins, M.D.   Dg Lumbar Spine Complete  03/24/2012  *RADIOLOGY REPORT*  Clinical Data: Upper back pain, radiating to the chest.  LUMBAR SPINE - COMPLETE 4+ VIEW  Comparison: 08/12 1009 CT  Findings: Multilevel degenerative changes.  Most pronounced at T11- 12 and L5-S1.  There is mild height loss at T11 and T12, similar to prior.  Multilevel vacuum disc phenomenon.  Advanced atherosclerotic calcification.  No acute fracture or dislocation. No aggressive osseous abnormality. SI joints intact.  IMPRESSION: Multilevel degenerative changes.  No acute osseous abnormality identified.  Original Report Authenticated By: Waneta Martins, M.D.     1. CAP (community acquired pneumonia)   2. Back pain       MDM  The patient's symptoms include chills cough and left-sided back pain with evidence of retrocardiac opacity on chest x-ray.  I suspect this is pneumonia with associated pleurisy.  Avelox was given.  The patient at this time is not hypoxic.  She is well appearing.  I think she is safe for discharge home from the emergency department.  She will follow up closely with her doctor.  She and her husband have been advised to keep a close eye on her Coumadin INR as she will be  started on Avelox.  They were told the importance of following up at the Coumadin clinic.  They understand to return the emergency department for new or worsening symptoms        Lyanne Co, MD 03/24/12 [phone]

## 2012-03-24 NOTE — Discharge Instructions (Signed)
All medications can alter your Coumadin level.  Please follow up closely with your Coumadin clinic as you will be on antibiotics.

## 2012-03-24 NOTE — Progress Notes (Signed)
ANTICOAGULATION CONSULT NOTE - Initial Consult  Pharmacy Consult for Warfarin Indication: Hx of  DVT/PE  No Known Allergies  Patient Measurements:  Ht:  152 cm Wt:  73 kg   Vital Signs: Temp: 97.6 F (36.4 C) (04/29 2219) Temp src: Oral (04/29 2219) BP: 125/52 mmHg (04/29 2219) Pulse Rate: 73  (04/29 2219)  Labs:  Basename 03/24/12 0130 03/23/12 2327  HGB -- 15.3*  HCT -- 45.9  PLT -- 132*  APTT -- --  LABPROT 26.1* --  INR 2.35* --  HEPARINUNFRC -- --  CREATININE -- 0.84  CKTOTAL -- --  CKMB -- --  TROPONINI <0.30 --   The CrCl is unknown because both a height and weight (above a minimum accepted value) are required for this calculation.  Medical History: Past Medical History  Diagnosis Date  . Hypertension   . High cholesterol   . DVT (deep venous thrombosis)   . Pneumonia     Medications:  Scheduled:    .  morphine injection  6 mg Intravenous Once  . moxifloxacin  400 mg Oral Once   Infusions:    Assessment: 78 yo admitted with pain in left flank area, on chronic coumadin for hx of DVT and PE  (1/12)  Goal of Therapy:  INR 2-3   Plan:   Daily PT/INR.  Education.  Susanne Greenhouse R 03/24/2012,2:38 AM

## 2012-03-24 NOTE — ED Notes (Signed)
Pt states that she cannot urinate

## 2012-04-09 ENCOUNTER — Other Ambulatory Visit: Payer: Self-pay | Admitting: Nurse Practitioner

## 2012-04-09 ENCOUNTER — Ambulatory Visit
Admission: RE | Admit: 2012-04-09 | Discharge: 2012-04-09 | Disposition: A | Discharge status: Home / Self Care | Payer: Medicare Other | Source: Ambulatory Visit | Attending: Nurse Practitioner | Admitting: Nurse Practitioner

## 2012-04-09 DIAGNOSIS — R768 Other specified abnormal immunological findings in serum: Secondary | ICD-10-CM

## 2012-04-21 ENCOUNTER — Other Ambulatory Visit: Payer: Self-pay | Admitting: Nurse Practitioner

## 2012-04-21 DIAGNOSIS — M79605 Pain in left leg: Secondary | ICD-10-CM

## 2012-04-30 ENCOUNTER — Ambulatory Visit
Admission: RE | Admit: 2012-04-30 | Discharge: 2012-04-30 | Disposition: A | Discharge status: Home / Self Care | Payer: Medicare Other | Source: Ambulatory Visit | Attending: Nurse Practitioner | Admitting: Nurse Practitioner

## 2012-04-30 DIAGNOSIS — M79605 Pain in left leg: Secondary | ICD-10-CM

## 2012-05-22 ENCOUNTER — Emergency Department (HOSPITAL_COMMUNITY)
Admission: EM | Admit: 2012-05-22 | Discharge: 2012-05-23 | Disposition: A | Discharge status: Home / Self Care | Payer: Medicare Other | Attending: Emergency Medicine | Admitting: Emergency Medicine

## 2012-05-22 ENCOUNTER — Encounter (HOSPITAL_COMMUNITY): Payer: Self-pay | Admitting: Family Medicine

## 2012-05-22 DIAGNOSIS — Z8701 Personal history of pneumonia (recurrent): Secondary | ICD-10-CM | POA: Insufficient documentation

## 2012-05-22 DIAGNOSIS — M549 Dorsalgia, unspecified: Secondary | ICD-10-CM

## 2012-05-22 DIAGNOSIS — E7800 Pure hypercholesterolemia, unspecified: Secondary | ICD-10-CM | POA: Insufficient documentation

## 2012-05-22 DIAGNOSIS — Z7901 Long term (current) use of anticoagulants: Secondary | ICD-10-CM | POA: Insufficient documentation

## 2012-05-22 DIAGNOSIS — Z8249 Family history of ischemic heart disease and other diseases of the circulatory system: Secondary | ICD-10-CM | POA: Insufficient documentation

## 2012-05-22 DIAGNOSIS — M545 Low back pain, unspecified: Secondary | ICD-10-CM | POA: Insufficient documentation

## 2012-05-22 DIAGNOSIS — I10 Essential (primary) hypertension: Secondary | ICD-10-CM | POA: Insufficient documentation

## 2012-05-22 DIAGNOSIS — Z86718 Personal history of other venous thrombosis and embolism: Secondary | ICD-10-CM | POA: Insufficient documentation

## 2012-05-22 MED ORDER — DIAZEPAM 5 MG PO TABS
2.5000 mg | ORAL_TABLET | Freq: Once | ORAL | Status: AC
  Administered 2012-05-22: 2.5 mg via ORAL
  Filled 2012-05-22: qty 1

## 2012-05-22 MED ORDER — HYDROMORPHONE HCL PF 1 MG/ML IJ SOLN
1.0000 mg | Freq: Once | INTRAMUSCULAR | Status: AC
  Administered 2012-05-22: 1 mg via INTRAMUSCULAR
  Filled 2012-05-22: qty 1

## 2012-05-22 MED ORDER — IBUPROFEN 200 MG PO TABS
400.0000 mg | ORAL_TABLET | Freq: Once | ORAL | Status: AC
  Administered 2012-05-22: 400 mg via ORAL
  Filled 2012-05-22: qty 2

## 2012-05-22 MED ORDER — DEXAMETHASONE 4 MG PO TABS
10.0000 mg | ORAL_TABLET | Freq: Once | ORAL | Status: AC
  Administered 2012-05-22: 10 mg via ORAL
  Filled 2012-05-22: qty 2.5

## 2012-05-22 NOTE — ED Notes (Signed)
Reports lower right back pain and right leg pain x1 week. Denies injury; denies urinary problems. Reports pain is worse with sitting and lying down.

## 2012-05-22 NOTE — ED Provider Notes (Signed)
History    81yF with back pain. Gradual onset almost a week ago and progressively worse. Ache at rest and worse with movement, particularly when sitting and bending forward. Denies trauma. No hx of similar. No numbness, tingling or loss of strength. No urinary complaints. No fever or chills. Denies hx of back surgery, malignancy or drug use. Is on coumadin for hx of dvt.   CSN: [id number]  Arrival date & time 05/22/12  1805   First MD Initiated Contact with Patient 05/22/12 2137      Chief Complaint  Patient presents with  . Back Pain    (Consider location/radiation/quality/duration/timing/severity/associated sxs/prior treatment) HPI  Past Medical History  Diagnosis Date  . Hypertension   . High cholesterol   . DVT (deep venous thrombosis)   . Pneumonia     Past Surgical History  Procedure Date  . Foot surgery     Family History  Problem Relation Age of Onset  . Coronary artery disease Other     History  Substance Use Topics  . Smoking status: Never Smoker   . Smokeless tobacco: Not on file  . Alcohol Use: No    OB History    Grav Para Term Preterm Abortions TAB SAB Ect Mult Living                  Review of Systems   Review of symptoms negative unless otherwise noted in HPI.   Allergies  Review of patient's allergies indicates no known allergies.  Home Medications   Current Outpatient Rx  Name Route Sig Dispense Refill  . ALPRAZOLAM 0.25 MG PO TABS Oral Take 0.25 mg by mouth 2 (two) times daily.    . ASPIRIN 81 MG PO TABS Oral Take 81 mg by mouth daily.    . ATENOLOL 50 MG PO TABS Oral Take 25 mg by mouth 2 (two) times daily.    . ATORVASTATIN CALCIUM 20 MG PO TABS Oral Take 20 mg by mouth daily.    [name] DILTIAZEM HCL 30 MG PO TABS Oral Take 30 mg by mouth 2 (two) times daily.    . FUROSEMIDE 20 MG PO TABS Oral Take 20 mg by mouth daily.    [name] GABAPENTIN 100 MG PO CAPS Oral Take 100 mg by mouth every evening.    [name] POTASSIUM CHLORIDE CRYS ER 10 MEQ PO  TBCR Oral Take 10 mEq by mouth daily.    [name] PRIMIDONE 250 MG PO TABS Oral Take 125 mg by mouth at bedtime.    . TRAMADOL HCL 50 MG PO TABS Oral Take 50 mg by mouth at bedtime.    . WARFARIN SODIUM 5 MG PO TABS Oral Take 5-7.5 mg by mouth daily.       BP 131/46  Pulse 53  Temp 98.5 F (36.9 C) (Oral)  Resp 16  SpO2 95%  Physical Exam  Nursing note and vitals reviewed. Constitutional: She appears well-developed and well-nourished.       Laying in bed. Mildly uncomfortable appearing but not toxic.  HENT:  Head: Normocephalic and atraumatic.  Eyes: Conjunctivae are normal. Right eye exhibits no discharge. Left eye exhibits no discharge.  Neck: Neck supple.  Cardiovascular: Normal rate, regular rhythm and normal heart sounds.  Exam reveals no gallop and no friction rub.   No murmur heard. Pulmonary/Chest: Effort normal and breath sounds normal. No respiratory distress.  Abdominal: Soft. She exhibits no distension. There is no tenderness.  Genitourinary:       No  cva tenderness.  Musculoskeletal: She exhibits no edema and no tenderness.       Back:       Arms:      Legs:      Tenderness R lower back on buttock. No overlying skin changes. Positive straight leg test. NO tenderness on L as depicted in graphic, but cannot figure out how to change.  Neurological: She is alert.       Strength 5/5 b/l le. Sensation intact to light touch. Patellar reflexes 1+.  Skin: Skin is warm and dry.  Psychiatric: She has a normal mood and affect. Her behavior is normal. Thought content normal.    ED Course  Procedures (including critical care time)  Labs Reviewed - No data to display No results found.   1. Back pain       MDM  81yF w/ back pain. Suspect sciatica. Gradual onset. There is no history of acute trauma. Pt denies hx of CA to suggest possible mets or pathologic fracture. Patient is afebrile. Denies hx of IV drug use. Doubt spinal epidural abscess or vertebral  osteomyelitis/discitis. There is no clinical evidence of spinal cord impingement. She has no acute neurological complaints and has a nonfocal neurological examination. No urinary symptoms to suggest UTI. Doubt ureteral colic. NO hx of back surgery. Pt is on coumadin for a history of dvt. Consider spinal epidural hematoma but doubt.  Doubt AAA or dissection. No overlying skin changes to suggest shingles. Do not feel imaging is indicated at this time. Pt feeling better after meds and no new complaints prior to discharge. Plan continued symptomatic tx. Return precautions discussed. Outpt fu.             Raeford Razor, MD 05/23/12 [phone]

## 2012-05-23 MED ORDER — DEXAMETHASONE 4 MG PO TABS: 4.0000 mg | ORAL_TABLET | Freq: Two times a day (BID) | ORAL | Status: AC

## 2012-05-23 MED ORDER — DIAZEPAM 5 MG PO TABS: 5.0000 mg | ORAL_TABLET | Freq: Three times a day (TID) | ORAL | Status: AC | PRN

## 2012-05-23 MED ORDER — OXYCODONE-ACETAMINOPHEN 5-325 MG PO TABS: 1.0000 | ORAL_TABLET | ORAL | Status: AC | PRN

## 2012-05-23 MED ORDER — HYDROMORPHONE HCL PF 1 MG/ML IJ SOLN
0.5000 mg | Freq: Once | INTRAMUSCULAR | Status: AC
  Administered 2012-05-23: 0.5 mg via INTRAMUSCULAR
  Filled 2012-05-23: qty 1

## 2012-05-27 ENCOUNTER — Other Ambulatory Visit: Payer: Self-pay | Admitting: Internal Medicine

## 2012-05-27 DIAGNOSIS — Z86711 Personal history of pulmonary embolism: Secondary | ICD-10-CM

## 2012-06-01 ENCOUNTER — Ambulatory Visit
Admission: RE | Admit: 2012-06-01 | Discharge: 2012-06-01 | Disposition: A | Discharge status: Home / Self Care | Payer: Medicare Other | Source: Ambulatory Visit | Attending: Internal Medicine | Admitting: Internal Medicine

## 2012-06-01 DIAGNOSIS — Z86711 Personal history of pulmonary embolism: Secondary | ICD-10-CM

## 2012-06-01 MED ORDER — IOHEXOL 350 MG/ML SOLN: 125.0000 mL | Freq: Once | INTRAVENOUS | Status: AC | PRN

## 2013-01-05 ENCOUNTER — Encounter (HOSPITAL_COMMUNITY)
Admission: RE | Admit: 2013-01-05 | Discharge: 2013-01-05 | Disposition: A | Discharge status: Home / Self Care | Payer: Medicare Other | Source: Ambulatory Visit | Attending: Rheumatology | Admitting: Rheumatology

## 2013-01-05 DIAGNOSIS — M810 Age-related osteoporosis without current pathological fracture: Secondary | ICD-10-CM | POA: Insufficient documentation

## 2013-01-05 MED ORDER — ZOLEDRONIC ACID 5 MG/100ML IV SOLN
INTRAVENOUS | Status: AC
  Administered 2013-01-05: 5 mg via INTRAVENOUS
  Filled 2013-01-05: qty 100

## 2013-01-05 MED ORDER — ZOLEDRONIC ACID 5 MG/100ML IV SOLN
5.0000 mg | Freq: Once | INTRAVENOUS | Status: AC
  Administered 2013-01-05: 5 mg via INTRAVENOUS

## 2013-02-02 ENCOUNTER — Other Ambulatory Visit: Payer: Self-pay | Admitting: Cardiovascular Disease

## 2013-02-02 ENCOUNTER — Ambulatory Visit
Admission: RE | Admit: 2013-02-02 | Discharge: 2013-02-02 | Disposition: A | Discharge status: Home / Self Care | Payer: Medicare Other | Source: Ambulatory Visit | Attending: Cardiovascular Disease | Admitting: Cardiovascular Disease

## 2013-02-02 DIAGNOSIS — R200 Anesthesia of skin: Secondary | ICD-10-CM

## 2013-03-09 ENCOUNTER — Other Ambulatory Visit: Payer: Self-pay | Admitting: Cardiovascular Disease

## 2013-03-09 DIAGNOSIS — M5090 Cervical disc disorder, unspecified, unspecified cervical region: Secondary | ICD-10-CM

## 2013-03-15 ENCOUNTER — Other Ambulatory Visit: Payer: Medicare Other

## 2013-03-16 ENCOUNTER — Ambulatory Visit
Admission: RE | Admit: 2013-03-16 | Discharge: 2013-03-16 | Disposition: A | Discharge status: Home / Self Care | Payer: Medicare Other | Source: Ambulatory Visit | Attending: Cardiovascular Disease | Admitting: Cardiovascular Disease

## 2013-03-16 DIAGNOSIS — M5090 Cervical disc disorder, unspecified, unspecified cervical region: Secondary | ICD-10-CM

## 2013-03-17 ENCOUNTER — Emergency Department (HOSPITAL_COMMUNITY)
Admission: EM | Admit: 2013-03-17 | Discharge: 2013-03-17 | Disposition: A | Discharge status: Home / Self Care | Payer: Medicare Other | Attending: Emergency Medicine | Admitting: Emergency Medicine

## 2013-03-17 ENCOUNTER — Emergency Department (HOSPITAL_COMMUNITY): Payer: Medicare Other

## 2013-03-17 ENCOUNTER — Encounter (HOSPITAL_COMMUNITY): Payer: Self-pay

## 2013-03-17 DIAGNOSIS — Z7982 Long term (current) use of aspirin: Secondary | ICD-10-CM | POA: Insufficient documentation

## 2013-03-17 DIAGNOSIS — Z8701 Personal history of pneumonia (recurrent): Secondary | ICD-10-CM | POA: Insufficient documentation

## 2013-03-17 DIAGNOSIS — Z86718 Personal history of other venous thrombosis and embolism: Secondary | ICD-10-CM | POA: Insufficient documentation

## 2013-03-17 DIAGNOSIS — N390 Urinary tract infection, site not specified: Secondary | ICD-10-CM

## 2013-03-17 DIAGNOSIS — R109 Unspecified abdominal pain: Secondary | ICD-10-CM | POA: Insufficient documentation

## 2013-03-17 DIAGNOSIS — I10 Essential (primary) hypertension: Secondary | ICD-10-CM | POA: Insufficient documentation

## 2013-03-17 DIAGNOSIS — K5900 Constipation, unspecified: Secondary | ICD-10-CM

## 2013-03-17 DIAGNOSIS — E7800 Pure hypercholesterolemia, unspecified: Secondary | ICD-10-CM | POA: Insufficient documentation

## 2013-03-17 DIAGNOSIS — R6883 Chills (without fever): Secondary | ICD-10-CM | POA: Insufficient documentation

## 2013-03-17 DIAGNOSIS — Z79899 Other long term (current) drug therapy: Secondary | ICD-10-CM | POA: Insufficient documentation

## 2013-03-17 LAB — URINALYSIS, ROUTINE W REFLEX MICROSCOPIC
Bilirubin Urine: NEGATIVE
Nitrite: NEGATIVE
Specific Gravity, Urine: 1.024 (ref 1.005–1.030)
Urobilinogen, UA: 1 mg/dL (ref 0.0–1.0)
pH: 6.5 (ref 5.0–8.0)

## 2013-03-17 LAB — URINE MICROSCOPIC-ADD ON

## 2013-03-17 LAB — CBC WITH DIFFERENTIAL/PLATELET
Basophils Absolute: 0 10*3/uL (ref 0.0–0.1)
Basophils Relative: 1 % (ref 0–1)
Eosinophils Absolute: 0.1 10*3/uL (ref 0.0–0.7)
HCT: 42.9 % (ref 36.0–46.0)
Hemoglobin: 14.6 g/dL (ref 12.0–15.0)
MCH: 30.1 pg (ref 26.0–34.0)
MCHC: 34 g/dL (ref 30.0–36.0)
Monocytes Absolute: 0.7 10*3/uL (ref 0.1–1.0)
Monocytes Relative: 11 % (ref 3–12)
Neutro Abs: 2.9 10*3/uL (ref 1.7–7.7)
RDW: 13.7 % (ref 11.5–15.5)

## 2013-03-17 LAB — COMPREHENSIVE METABOLIC PANEL
AST: 28 U/L (ref 0–37)
Albumin: 3.3 g/dL — ABNORMAL LOW (ref 3.5–5.2)
BUN: 19 mg/dL (ref 6–23)
Calcium: 9.2 mg/dL (ref 8.4–10.5)
Creatinine, Ser: 0.9 mg/dL (ref 0.50–1.10)
Total Protein: 7.2 g/dL (ref 6.0–8.3)

## 2013-03-17 MED ORDER — DOCUSATE SODIUM 100 MG PO CAPS: 100.0000 mg | ORAL_CAPSULE | Freq: Two times a day (BID) | ORAL | Status: DC

## 2013-03-17 MED ORDER — MORPHINE SULFATE 4 MG/ML IJ SOLN
1.0000 mg | Freq: Once | INTRAMUSCULAR | Status: AC
  Administered 2013-03-17: 1 mg via INTRAVENOUS
  Filled 2013-03-17: qty 1

## 2013-03-17 MED ORDER — SULFAMETHOXAZOLE-TRIMETHOPRIM 800-160 MG PO TABS: 1.0000 | ORAL_TABLET | Freq: Two times a day (BID) | ORAL | Status: DC

## 2013-03-17 NOTE — ED Provider Notes (Signed)
Medical screening examination/treatment/procedure(s) were conducted as a shared visit with non-physician practitioner(s) and myself.  I personally evaluated the patient during the encounter.  The patient presents with pain in the left flank for the past 5 days.  She denies any injury or trauma.  No urinary complaints.  She had pneumonia in the past with similar symptoms.    On exam, the vitals are stable and the patient is afebrile.  The heart is regular rate and rhythm and the lungs are clear and equal.  There is mild left sided cva ttp noted.  There is no abdominal ttp.    Due to the nature of the symptoms, a ct scan was obtained to rule out renal calculus and other intra-abdominal pathology.  This was negative with the exception of apparent constipation and the ua shows a uti.  Will treat with laxatives, antibiotics and follow up prn.  Geoffery Lyons, MD 03/17/13 [phone]

## 2013-03-17 NOTE — ED Notes (Signed)
Pt to xray

## 2013-03-17 NOTE — ED Notes (Signed)
Pt alert and oriented x4. Respirations even and unlabored, bilateral symmetrical rise and fall of chest. Skin warm and dry. In no acute distress. Denies needs.   

## 2013-03-17 NOTE — ED Notes (Signed)
Pt back from xray and CT

## 2013-03-17 NOTE — ED Provider Notes (Signed)
History     CSN: [id number]  Arrival date & time 03/17/13  1721   First MD Initiated Contact with Patient 03/17/13 1724      Chief Complaint  Patient presents with  . Flank Pain    (Consider location/radiation/quality/duration/timing/severity/associated sxs/prior treatment) HPI  Patient is a 79 year old female past medical history significant for DVT presenting to ED for sharp constant left flank pain with radiation to the abdomen that began 5 days ago. Pain is 10 out of 10. No alleviating factors. Pain is worse with movement. Has some associated chills Patient states she's had similar symptoms of pain of this last year and was diagnosed with pneumonia. Patient denies any urinary symptoms such as frequency, urgency, dysuria, hematuria. Denies any fevers, nausea, vomiting, diarrhea.   Past Medical History  Diagnosis Date  . Hypertension   . High cholesterol   . DVT (deep venous thrombosis)   . Pneumonia     Past Surgical History  Procedure Laterality Date  . Foot surgery      Family History  Problem Relation Age of Onset  . Coronary artery disease Other     History  Substance Use Topics  . Smoking status: Never Smoker   . Smokeless tobacco: Not on file  . Alcohol Use: No    OB History   Grav Para Term Preterm Abortions TAB SAB Ect Mult Living                  Review of Systems  Constitutional: Positive for chills. Negative for fever.  HENT: Negative.   Eyes: Negative.   Respiratory: Negative for shortness of breath.   Cardiovascular: Negative for chest pain.  Gastrointestinal: Positive for abdominal pain. Negative for nausea, vomiting and diarrhea.  Genitourinary: Positive for flank pain. Negative for dysuria, hematuria, vaginal bleeding, vaginal discharge and vaginal pain.  Musculoskeletal: Negative.   Skin: Negative.   Neurological: Negative.     Allergies  Review of patient's allergies indicates no known allergies.  Home Medications   Current  Outpatient Rx  Name  Route  Sig  Dispense  Refill  . ALPRAZolam (XANAX) 0.25 MG tablet   Oral   Take 0.25 mg by mouth 2 (two) times daily.         [name] aspirin 81 MG tablet   Oral   Take 81 mg by mouth daily.         [name] atenolol (TENORMIN) 50 MG tablet   Oral   Take 25 mg by mouth 2 (two) times daily.         [name] atorvastatin (LIPITOR) 20 MG tablet   Oral   Take 20 mg by mouth daily.         [name] diltiazem (CARDIZEM) 30 MG tablet   Oral   Take 30 mg by mouth 2 (two) times daily.         . furosemide (LASIX) 20 MG tablet   Oral   Take 20 mg by mouth daily.         [name] gabapentin (NEURONTIN) 100 MG capsule   Oral   Take 100 mg by mouth every evening.         . potassium chloride (K-DUR,KLOR-CON) 10 MEQ tablet   Oral   Take 10 mEq by mouth daily.         . primidone (MYSOLINE) 250 MG tablet   Oral   Take 125 mg by mouth at bedtime.         . traMADol Janean Sark)  50 MG tablet   Oral   Take 50 mg by mouth at bedtime.         . docusate sodium (COLACE) 100 MG capsule   Oral   Take 1 capsule (100 mg total) by mouth 2 (two) times daily.   10 capsule   0   . sulfamethoxazole-trimethoprim (SEPTRA DS) 800-160 MG per tablet   Oral   Take 1 tablet by mouth 2 (two) times daily.   6 tablet   0     BP 111/54  Pulse 98  Temp(Src) 98.1 F (36.7 C) (Oral)  Resp 18  SpO2 94%  Physical Exam  Constitutional: She is oriented to person, place, and time. She appears well-developed and well-nourished. No distress.  HENT:  Head: Normocephalic and atraumatic.  Mouth/Throat: Oropharynx is clear and moist. No oropharyngeal exudate.  Eyes: EOM are normal. Pupils are equal, round, and reactive to light.  Neck: Neck supple.  Cardiovascular: Normal rate, regular rhythm and normal heart sounds.   Pulmonary/Chest: Effort normal and breath sounds normal. No respiratory distress.  Abdominal: Soft. Bowel sounds are normal. There is tenderness.  Musculoskeletal: Normal range of  motion.  Neurological: She is alert and oriented to person, place, and time.  Skin: Skin is warm and dry. No rash noted. She is not diaphoretic.  Psychiatric: She has a normal mood and affect.    ED Course  Procedures (including critical care time)  Medications  morphine 4 MG/ML injection 1 mg (1 mg Intravenous Given 03/17/13 1811)     Labs Reviewed  URINALYSIS, ROUTINE W REFLEX MICROSCOPIC - Abnormal; Notable for the following:    APPearance CLOUDY (*)    Hgb urine dipstick TRACE (*)    Leukocytes, UA LARGE (*)    All other components within normal limits  CBC WITH DIFFERENTIAL - Abnormal; Notable for the following:    Platelets 128 (*)    All other components within normal limits  COMPREHENSIVE METABOLIC PANEL - Abnormal; Notable for the following:    Glucose, Bld 115 (*)    Albumin 3.3 (*)    Total Bilirubin 0.2 (*)    GFR calc non Af Amer 59 (*)    GFR calc Af Amer 68 (*)    All other components within normal limits  URINE MICROSCOPIC-ADD ON - Abnormal; Notable for the following:    Bacteria, UA FEW (*)    All other components within normal limits  URINE CULTURE   Ct Abdomen Pelvis Wo Contrast  03/17/2013  *RADIOLOGY REPORT*  Clinical Data:  Left-sided flank pain for 5 days.  CT ABDOMEN AND PELVIS WITHOUT CONTRAST (CT UROGRAM)  Technique: Contiguous axial images of the abdomen and pelvis without oral or intravenous contrast were obtained.  Comparison: 07/06/2008  Findings:  Exam is limited for evaluation of entities other than urinary tract calculi due to lack of oral or intravenous contrast.   Lung bases:  Minimal motion degradation.  Mild cardiomegaly with coronary artery atherosclerosis.  Abdomen/pelvis:  Mild motion degradation continuing into the upper abdomen.  Grossly normal uninfused appearance of the liver, spleen, stomach, pancreas, gallbladder, biliary tract, adrenal glands.  Bilateral renal atrophy, mild. No renal calculi or hydronephrosis. Sub centimeter low  density lower pole left renal lesion which is likely a cyst.  Renal vascular calcifications are identified bilaterally.  No hydroureter.  No convincing evidence of ureteric stone.  Extensive vascular calcifications are identified throughout the pelvis as well.  No retroperitoneal or retrocrural adenopathy.  Normal colon and  terminal ileum.  Appendix not visualized.  There is suggestion of solid formed stool like material within the pelvic small bowel loops.  Example image 60/series 2.  There is no dilatation to suggest obstruction.  This could represent a component of stasis or constipation.  No pelvic adenopathy.  Normal urinary bladder and uterus, without adnexal mass or significant free pelvic fluid. Fat containing ventral abdominal wall hernia.  Bones/Musculoskeletal:  No acute osseous abnormality.  IMPRESSION:  1. No urinary tract calculi or hydronephrosis. 2.  Solid formed like material in the distal small bowel.  This suggests a component of constipation and/or bowel stasis. 3.  Fat containing ventral abdominal wall hernia, without acute complication. 4.  Mild motion degradation.   Original Report Authenticated By: Jeronimo Greaves, M.D.    Dg Chest 2 View  03/17/2013  *RADIOLOGY REPORT*  Clinical Data: Shortness of breath.  Weakness.  CHEST - 2 VIEW  Comparison: CT chest with contrast 06/01/2012.  Two-view chest x- ray 04/09/2012.  Findings: Low lung volumes exaggerate the heart size.  There is no significant edema or effusions to suggest failure.  Mild bibasilar airspace disease likely reflects atelectasis.  Endplate degenerative changes and exaggerated kyphosis are stable.  IMPRESSION:  1.  Borderline cardiomegaly without failure. 2.  Decreased lung volumes with mild bibasilar airspace disease likely reflecting atelectasis.   Original Report Authenticated By: Marin Roberts, M.D.    Mr Cervical Spine Wo Contrast  03/16/2013  *RADIOLOGY REPORT*  Clinical Data: 79 year old female with cervical disc  disease. Right upper extremity numbness and tingling.  MRI CERVICAL SPINE WITHOUT CONTRAST  Technique:  Multiplanar and multiecho pulse sequences of the cervical spine, to include the craniocervical junction and cervicothoracic junction, were obtained according to standard protocol without intravenous contrast.  Comparison: Cervical radiographs 02/02/2013.  Findings: Mild anterolisthesis of C4 on C5 measures 2 mm.  Stable cervical vertebral height and alignment. No marrow edema or evidence of acute osseous abnormality.  Cervicomedullary junction is within normal limits.  No spinal cord signal abnormality identified. Visualized paraspinal soft tissues are within normal limits.  C2-C3:  Small central disc protrusion.  Mild ligament flavum hypertrophy.  Mild facet hypertrophy on the left.  No significant stenosis.  C3-C4:  Mild disc bulge.  Mild to moderate bilateral facet hypertrophy.  Mild bilateral C4 foraminal stenosis.  C4-C5:  Moderate ligament flavum hypertrophy greater on the left. Moderate to severe facet hypertrophy greater on the left. Uncovertebral hypertrophy also appears greater on the left.  Mild disc/pseudo disc broad-based bulge.  Mild spinal stenosis without spinal cord mass effect.  Severe left and mild to moderate right C5 foraminal stenosis.  C5-C6:  Severe disc space loss.  Right eccentric circumferential disc osteophyte complex.  Mild facet hypertrophy.  Mild spinal stenosis.  Minimal if any spinal cord mass effect.  Moderate bilateral C6 foraminal stenosis.  C6-C7:  Moderate ligament flavum hypertrophy.  Mild disc bulge.  No significant stenosis.  C7-T1:  Negative.  Visible upper thoracic levels remarkable for mild disc bulges/small central protrusion.  No upper thoracic spinal stenosis.  IMPRESSION: 1.  Degenerative mild spondylolisthesis at C4-C5 associated with disc, ligamentous and facet degeneration.  Mild subsequent spinal stenosis and severe left and mild to moderate right C5 foraminal  stenosis. 2. Chronic C5-C6 disc and endplate degeneration with mild spinal stenosis and moderate bilateral C6 foraminal stenosis. 3.  Age congruent cervical findings elsewhere.   Original Report Authenticated By: Erskine Speed, M.D.      1. UTI (urinary  tract infection)   2. Flank pain   3. Constipation       MDM  Pt has been diagnosed with a UTI. Pt is afebrile, no CVA tenderness, normotensive, and denies N/V. Pt to be dc home with antibiotics and instructions to follow up with PCP if symptoms persist. Patient is nontoxic, nonseptic appearing, in no apparent distress.  Patient's pain and other symptoms adequately managed in emergency department.  Fluid bolus given.  Labs, imaging and vitals reviewed.  Patient does not meet the SIRS or Sepsis criteria.  On repeat exam patient does not have a surgical abdomen and there are nor peritoneal signs.  No indication of appendicitis, bowel obstruction, bowel perforation, cholecystitis, diverticulitis, PID or ectopic pregnancy.  Patient discharged home with symptomatic treatment and given strict instructions for follow-up with their primary care physician.  I have also discussed reasons to return immediately to the ER.  Patient expresses understanding and agrees with plan. Patient d/w with Dr. Judd Lien, agrees with plan. Patient is stable at time of discharge              Jeannetta Ellis, PA-C 03/17/13 2023

## 2013-03-17 NOTE — ED Notes (Signed)
Pt c/o L flank pain radiating to L abdomen x 5 days.  Pain score 10/10.  Denies pain or buring with urination.

## 2013-03-18 LAB — URINE CULTURE

## 2013-04-20 ENCOUNTER — Ambulatory Visit: Payer: Self-pay | Admitting: Neurology

## 2013-07-08 ENCOUNTER — Ambulatory Visit
Admission: RE | Admit: 2013-07-08 | Discharge: 2013-07-08 | Disposition: A | Discharge status: Home / Self Care | Payer: Medicare Other | Source: Ambulatory Visit | Attending: Cardiovascular Disease | Admitting: Cardiovascular Disease

## 2013-07-08 ENCOUNTER — Other Ambulatory Visit: Payer: Self-pay | Admitting: Cardiovascular Disease

## 2013-07-08 DIAGNOSIS — M25571 Pain in right ankle and joints of right foot: Secondary | ICD-10-CM

## 2013-07-10 ENCOUNTER — Other Ambulatory Visit: Payer: Self-pay | Admitting: Neurology

## 2013-08-05 ENCOUNTER — Ambulatory Visit: Payer: Self-pay | Admitting: Neurology

## 2013-08-11 ENCOUNTER — Other Ambulatory Visit: Payer: Self-pay | Admitting: Internal Medicine

## 2013-08-11 DIAGNOSIS — R209 Unspecified disturbances of skin sensation: Secondary | ICD-10-CM

## 2013-08-12 ENCOUNTER — Encounter: Payer: Self-pay | Admitting: Neurology

## 2013-08-16 ENCOUNTER — Other Ambulatory Visit: Payer: Self-pay | Admitting: Neurology

## 2013-08-17 ENCOUNTER — Other Ambulatory Visit: Payer: Medicare Other

## 2013-08-19 ENCOUNTER — Ambulatory Visit
Admission: RE | Admit: 2013-08-19 | Discharge: 2013-08-19 | Disposition: A | Discharge status: Home / Self Care | Payer: Medicare Other | Source: Ambulatory Visit | Attending: Internal Medicine | Admitting: Internal Medicine

## 2013-08-19 DIAGNOSIS — R209 Unspecified disturbances of skin sensation: Secondary | ICD-10-CM

## 2013-09-27 ENCOUNTER — Ambulatory Visit: Payer: Self-pay | Admitting: Neurology

## 2013-10-14 ENCOUNTER — Ambulatory Visit: Payer: Self-pay | Admitting: Neurology

## 2013-10-27 ENCOUNTER — Encounter: Payer: Self-pay | Admitting: Neurology

## 2013-10-27 ENCOUNTER — Ambulatory Visit (INDEPENDENT_AMBULATORY_CARE_PROVIDER_SITE_OTHER): Payer: Medicare Other | Admitting: Neurology

## 2013-10-27 VITALS — BP 133/56 | HR 48 | Temp 97.2°F | Ht <= 58 in | Wt 160.0 lb

## 2013-10-27 DIAGNOSIS — E119 Type 2 diabetes mellitus without complications: Secondary | ICD-10-CM | POA: Insufficient documentation

## 2013-10-27 DIAGNOSIS — G609 Hereditary and idiopathic neuropathy, unspecified: Secondary | ICD-10-CM

## 2013-10-27 DIAGNOSIS — Z8669 Personal history of other diseases of the nervous system and sense organs: Secondary | ICD-10-CM | POA: Insufficient documentation

## 2013-10-27 DIAGNOSIS — E669 Obesity, unspecified: Secondary | ICD-10-CM

## 2013-10-27 DIAGNOSIS — E663 Overweight: Secondary | ICD-10-CM | POA: Insufficient documentation

## 2013-10-27 DIAGNOSIS — G250 Essential tremor: Secondary | ICD-10-CM

## 2013-10-27 HISTORY — DX: Type 2 diabetes mellitus without complications: E11.9

## 2013-10-27 HISTORY — DX: Hereditary and idiopathic neuropathy, unspecified: G60.9

## 2013-10-27 HISTORY — DX: Obesity, unspecified: E66.9

## 2013-10-27 HISTORY — DX: Essential tremor: G25.0

## 2013-10-27 MED ORDER — GABAPENTIN 100 MG PO CAPS: 300.0000 mg | ORAL_CAPSULE | Freq: Every day | ORAL | Status: DC

## 2013-10-27 MED ORDER — PRIMIDONE 250 MG PO TABS: 250.0000 mg | ORAL_TABLET | Freq: Two times a day (BID) | ORAL | Status: DC

## 2013-10-27 NOTE — Progress Notes (Signed)
Subjective:    Patient ID: [name] [name] is a 79 y.o. female.  HPI   Interim history:   [name] [name] is a very pleasant 79 year old [place] lady, who presents for followup consultation of her tremors. She is accompanied by her son, again today as well as an interpreter, [name] [name]. I first met her on 01/19/2013, at which time I increased her Mysoline 250 mg strength 1-1/2 pills to 2 pills daily. I did advise her of the potential increase risk of side effects. She previously followed with Dr. Avie Echevaria. She has a billateral UE and head tremor for over 25 years. She was tried on primidone while she was in [place] with improvement in tremor. It was discontinued when she came to the [place] because she was on Coumadin. Her tremor affects her ability to feed herself, write, and put on her makeup and is worse with anxiety. Her daughter has a tremor as well. She was restarted on primidone by Dr. Sandria Manly in 3/12. She is no longer on coumadin. She was on coumadin for a history of DVT in her left leg following left foot injury. She has had numbness in her legs without pain. EMG/NCV 02/27/11 was normal except for some denervation in the left lumbosacral paraspinal muscles. B12 level was normal. She is on gabapentin 100 mg 2 at night with relief of symptoms. Her major complaint is that of ongoing tremor, which is worse in the morning. She has not had side effects such as grogginess or difficulty walking with the primidone. She no longer cooks. Her tremor involves her writing and abilities to feed herself. She also continues to take gabapentin 100mg  2 qHS for her neuropathy. She has not fallen per son. She was supposed to come back in 3 months but missed several appointments. She has been diagnosed with DM and has been started on metforim. She feels, her numbness and tingling is worse in her feet and she has now has started noticing burning sensation in her feet.   Her Past Medical History Is Significant For: Past  Medical History  Diagnosis Date  . Hypertension   . High cholesterol   . DVT (deep venous thrombosis)   . Pneumonia   . Anxiety   . Essential tremor 10/27/2013  . Obesity, unspecified 10/27/2013  . Newly diagnosed diabetes 10/27/2013  . Unspecified hereditary and idiopathic peripheral neuropathy 10/27/2013    Her Past Surgical History Is Significant For: Past Surgical History  Procedure Laterality Date  . Foot surgery    . Cataract extraction Bilateral     Her Family History Is Significant For: Family History  Problem Relation Age of Onset  . Coronary artery disease Other     Her Social History Is Significant For: History   Social History  . Marital Status: Widowed    Spouse Name: N/A    Number of Children: N/A  . Years of Education: N/A   Social History Main Topics  . Smoking status: Never Smoker   . Smokeless tobacco: None  . Alcohol Use: No  . Drug Use: No  . Sexual Activity: No   Other Topics Concern  . None   Social History Narrative  . None    Her Allergies Are:  No Known Allergies:   Her Current Medications Are:  Outpatient Encounter Prescriptions as of 10/27/2013  Medication Sig  . ALPRAZolam (XANAX) 0.25 MG tablet Take 0.25 mg by mouth 2 (two) times daily.  [name] aspirin 81 MG tablet Take 81 mg  by mouth daily.  [name] atenolol (TENORMIN) 50 MG tablet Take 25 mg by mouth 2 (two) times daily.  [name] atorvastatin (LIPITOR) 20 MG tablet Take 20 mg by mouth daily.  [name] diltiazem (CARDIZEM) 30 MG tablet Take 30 mg by mouth 2 (two) times daily.  [name] docusate sodium (COLACE) 100 MG capsule Take 1 capsule (100 mg total) by mouth 2 (two) times daily.  . furosemide (LASIX) 20 MG tablet Take 20 mg by mouth daily.  [name] gabapentin (NEURONTIN) 100 MG capsule Take 3 capsules (300 mg total) by mouth at bedtime.  . metFORMIN (GLUCOPHAGE) 500 MG tablet Take 250 mg by mouth 2 (two) times daily with a meal.  . potassium chloride (K-DUR,KLOR-CON) 10 MEQ tablet Take 10 mEq by mouth daily.   . primidone (MYSOLINE) 250 MG tablet Take 1 tablet (250 mg total) by mouth 2 (two) times daily.  [name] sulfamethoxazole-trimethoprim (SEPTRA DS) 800-160 MG per tablet Take 1 tablet by mouth 2 (two) times daily.  . traMADol (ULTRAM) 50 MG tablet Take 50 mg by mouth at bedtime.  . [DISCONTINUED] gabapentin (NEURONTIN) 100 MG capsule TAKE 2 CAPSULES AT BEDTIME  . [DISCONTINUED] primidone (MYSOLINE) 250 MG tablet TAKE 1 TABLET TWICE A DAY  :  Review of Systems:  Out of a complete 14 point review of systems, all are reviewed and negative with the exception of these symptoms as listed below:   Review of Systems  Constitutional: Negative.   HENT: Negative.   Eyes: Negative.   Respiratory: Negative.   Cardiovascular: Negative.   Gastrointestinal: Negative.   Genitourinary: Negative.   Musculoskeletal: Negative.   Skin: Negative.   Allergic/Immunologic: Negative.   Neurological: Positive for tremors.       Restless leg  Hematological: Negative.   Psychiatric/Behavioral: Negative.     Objective:  Neurologic Exam  Physical Exam Physical Examination:   Filed Vitals:   10/27/13 1157  BP: 133/56  Pulse: 48  Temp: 97.2 F (36.2 C)    General Examination: The patient is a very pleasant 79 y.o. female in no acute distress. She appears well-developed and well-nourished and well groomed. She is obese.   HEENT: Normocephalic, atraumatic, pupils are equal, round and reactive to light and accommodation. Hearing is intact. Extraocular tracking is good without nystagmus. She has a moderate degree of head tremor, no-no type. She has a slight degree of voice tremor. She is not dysarthric. Oropharynx is clear. She has mild airway crowding. Neck is otherwise supple with full range of motion.   Chest: Clear to auscultation without wheezing, rhonchi or crackles noted.  Heart: S1+S2+0, regular and normal without murmurs, rubs or gallops noted.   Abdomen: Soft, non-tender and non-distended with normal  bowel sounds appreciated on auscultation.  Extremities: There is no pitting edema in the distal lower extremities bilaterally. Pedal pulses are intact.  Skin: Warm and dry without trophic changes noted. There are no varicose veins.  Musculoskeletal: exam reveals no obvious joint deformities, tenderness or joint swelling or erythema.   Neurologically:  Mental status: The patient is awake, alert and oriented in all 4 spheres. His memory, attention, language and knowledge are appropriate. There is no aphasia, agnosia, apraxia or anomia. Speech is clear with normal prosody and enunciation. Thought process is linear. Mood is congruent and affect is normal.  Cranial nerves are as described above under HEENT exam. In addition, shoulder shrug is normal with equal shoulder height noted. Motor exam: Normal bulk, strength and tone is noted. There is no drift,  no resting tremor. She has a moderate degree of bilateral upper extremity postural and action tremor. There is no rebound. Romberg is negative. Reflexes are 2+ throughout. Toes are downgoing bilaterally. Fine motor skills are mildly impaired bilaterally in the upper and lower extremities. Cerebellar testing shows no dysmetria or intention tremor on finger to nose testing. Heel to shin is unremarkable bilaterally. There is no truncal or gait ataxia.  Sensory exam: intact to light touch but decreased to pinprick, temperature and vibration sense in the distal lower extremities bilaterally up to mid calf.   is intact to light touch, pinprick, vibration, temperature sense and proprioception in the upper and lower extremities.  Gait, station and balance: She stands up slowly and has a slightly wide-based stance. She walks cautiously but has preserved balance and preserved arm swing. She does walk a little slowly. Her initial steps appear to be painful but overall she does not have an antalgic gait or a limp.   Assessment and Plan:   In summary, [name] [name] is a very pleasant 79 y.o.-year old female with a history of obesity, previous DVT, recent diagnosis of diabetes, who presents for followup consultation of her essential tremor which affects her both upper extremities and her head. She has remained fairly stable in that regard. Her neuropathy is most likely diabetic in etiology. While her sensor stable she does report more pins and needles sensation and burning in her feet. She is advised to continue with good blood sugar control and weight management. For her tremors I have asked her to continue with Mysoline at the same strength. I would worry about side effects and given her advanced age I would not want to increase the dose I explained that to them. They were in agreement. As far as the gabapentin is concerned I suggested that we increase it to 3 pills at night which will be a total dose of 300 mg each bedtime. This can help her paresthesias and burning sensation in her feet. I did explain to them that neuropathy is usually not reversible and prevention is key. I would like to see her back in 6 months from now, sooner if the need arises and encouraged them to call with any interim questions, concerns, problems or update. They were in agreement. I renewed both prescriptions, Mysoline and gabapentin for 90 day prescriptions with 3 refills.  Most of my 25 minute visit today was spent in counseling and coordination of care, reviewing of prior test results and reviewing medications.

## 2013-10-27 NOTE — Patient Instructions (Signed)
I think overall you are doing fairly well and are stable at this point.   I do have some generic suggestions for you today:   Please make sure that you drink plenty of fluids. I would like for you to exercise daily for example in the form of walking 20-30 minutes every day, if you can. Please keep a regular sleep-wake schedule, keep regular meal times, do not skip any meals, eat  healthy snacks in between meals, such as fruit or nuts. Try to eat protein with every meal.   As far as your medications are concerned, I would like to suggest: increasing gabapentin 100 mg strength: 3 capsules at night. Continue with primidone 250 mg twice daily.    As far as diagnostic testing, I recommend: no new test needed.   I think you're stable enough that I can see you back in 6 months, sooner if we need to. Please call us if you have any interim questions, concerns, or problems or updates to need to discuss.  Please also call us for any test results so we can go over those with you on the phone. Brett Canales is my clinical assistant and will answer any of your questions and relay your messages to me and will give you my messages.   Our phone number is [phone]. We also have an after hours call service for urgent matters and there is a physician on-call for urgent questions. For any emergencies you know to call 911 or go to the nearest emergency room.

## 2014-01-13 ENCOUNTER — Emergency Department (INDEPENDENT_AMBULATORY_CARE_PROVIDER_SITE_OTHER)
Admission: EM | Admit: 2014-01-13 | Discharge: 2014-01-13 | Disposition: A | Discharge status: Home / Self Care | Payer: Medicare Other | Source: Home / Self Care | Attending: Emergency Medicine | Admitting: Emergency Medicine

## 2014-01-13 ENCOUNTER — Encounter (HOSPITAL_COMMUNITY): Payer: Self-pay | Admitting: Emergency Medicine

## 2014-01-13 DIAGNOSIS — A088 Other specified intestinal infections: Secondary | ICD-10-CM

## 2014-01-13 DIAGNOSIS — A084 Viral intestinal infection, unspecified: Secondary | ICD-10-CM

## 2014-01-13 NOTE — ED Provider Notes (Signed)
CSN: [id number]     Arrival date & time 01/13/14  1410 History   First MD Initiated Contact with Patient 01/13/14 1422     Chief Complaint  Patient presents with  . Generalized Body Aches     (Consider location/radiation/quality/duration/timing/severity/associated sxs/prior Treatment) HPI Comments: Per interpreter this 80 year old female presents with the onset of diarrhea this morning. She has had 2 loose stools. She has an occasional to rare cough as well. Denies vomiting or nausea. She also states that her bones hurt all over. Denies upper respiratory congestion, nasal congestion, sore throat, shortness of breath or chest pain.   Past Medical History  Diagnosis Date  . Hypertension   . High cholesterol   . DVT (deep venous thrombosis)   . Pneumonia   . Anxiety   . Essential tremor 10/27/2013  . Obesity, unspecified 10/27/2013  . Newly diagnosed diabetes 10/27/2013  . Unspecified hereditary and idiopathic peripheral neuropathy 10/27/2013   Past Surgical History  Procedure Laterality Date  . Foot surgery    . Cataract extraction Bilateral    Family History  Problem Relation Age of Onset  . Coronary artery disease Other    History  Substance Use Topics  . Smoking status: Never Smoker   . Smokeless tobacco: Not on file  . Alcohol Use: No   OB History   Grav Para Term Preterm Abortions TAB SAB Ect Mult Living                 Review of Systems  Constitutional: Negative for fever, diaphoresis, activity change and fatigue.  HENT: Negative.   Respiratory: Positive for cough. Negative for shortness of breath.   Cardiovascular: Negative.   Gastrointestinal: Positive for diarrhea. Negative for vomiting.       Mild, occasional generalized abdominal discomfort but no pain. No pain currently.  Genitourinary: Negative.   Musculoskeletal: Positive for myalgias.  Skin: Negative.   Neurological: Negative.       Allergies  Review of patient's allergies indicates no known  allergies.  Home Medications   Current Outpatient Rx  Name  Route  Sig  Dispense  Refill  . ALPRAZolam (XANAX) 0.25 MG tablet   Oral   Take 0.25 mg by mouth 2 (two) times daily.         [name] aspirin 81 MG tablet   Oral   Take 81 mg by mouth daily.         [name] atenolol (TENORMIN) 50 MG tablet   Oral   Take 25 mg by mouth 2 (two) times daily.         [name] atorvastatin (LIPITOR) 20 MG tablet   Oral   Take 20 mg by mouth daily.         [name] diltiazem (CARDIZEM) 30 MG tablet   Oral   Take 30 mg by mouth 2 (two) times daily.         . furosemide (LASIX) 20 MG tablet   Oral   Take 20 mg by mouth daily.         [name] gabapentin (NEURONTIN) 100 MG capsule   Oral   Take 3 capsules (300 mg total) by mouth at bedtime.   270 capsule   3   . metFORMIN (GLUCOPHAGE) 500 MG tablet   Oral   Take 250 mg by mouth 2 (two) times daily with a meal.         . potassium chloride (K-DUR,KLOR-CON) 10 MEQ tablet   Oral   Take 10 mEq  by mouth daily.         [name]. docusate sodium (COLACE) 100 MG capsule   Oral   Take 1 capsule (100 mg total) by mouth 2 (two) times daily.   10 capsule   0   . primidone (MYSOLINE) 250 MG tablet   Oral   Take 1 tablet (250 mg total) by mouth 2 (two) times daily.   180 tablet   3   . sulfamethoxazole-trimethoprim (SEPTRA DS) 800-160 MG per tablet   Oral   Take 1 tablet by mouth 2 (two) times daily.   6 tablet   0   . traMADol (ULTRAM) 50 MG tablet   Oral   Take 50 mg by mouth at bedtime.          BP 127/59  Pulse 61  Temp(Src) 97.9 F (36.6 C) (Oral)  Resp 16  SpO2 99% Physical Exam  Nursing note and vitals reviewed. Constitutional: She is oriented to person, place, and time. She appears well-developed and well-nourished. No distress.  Alert, awake, aware, cooperative, smiling, does not appear sick, lasting and conversing well although with very little [place].  HENT:  Mouth/Throat: Oropharynx is clear and moist. No oropharyngeal exudate.   Eyes: Conjunctivae and EOM are normal.  Neck: Normal range of motion. Neck supple.  Cardiovascular: Normal rate, regular rhythm and normal heart sounds.   Pulmonary/Chest: Effort normal and breath sounds normal. No respiratory distress. She has no wheezes.  Abdominal: Soft. She exhibits no distension. There is no tenderness.  Musculoskeletal: Normal range of motion. She exhibits no edema and no tenderness.  Neurological: She is alert and oriented to person, place, and time. She exhibits normal muscle tone.  Skin: Skin is warm and dry. No rash noted.    ED Course  Procedures (including critical care time) Labs Review Labs Reviewed - No data to display Imaging Review No results found.    MDM   Final diagnoses:  Viral gastroenteritis      Probably a mild case of viral gastroenteritis. Drink plenty of clear liquids and stay well hydrated. Has had 2 diarrhea stools to be a will light and and and is 1 and a fall and a half and a chief and day he may take Imodium right ear. For muscle aches or pains ibuprofen or Tylenol. Return if worsening symptoms or problems D/C in good condition, stable. No Immodium AD unless having >3 watery stools q d Plenty of liquids, water.  Hayden Rasmussenavid Shakeila Pfarr, NP 01/13/14 [phone]

## 2014-01-13 NOTE — ED Notes (Signed)
Pt c/o generalized BA onset 2 days Sxs also include diarrhea, cough Denies f/v... Taking advil w/temp relief Alert w/no sign of acute distress.

## 2014-01-13 NOTE — Discharge Instructions (Signed)
Diet for Diarrhea, Adult °Frequent, runny stools (diarrhea) may be caused or worsened by food or drink. Diarrhea may be relieved by changing your diet. Since diarrhea can last up to 7 days, it is easy for you to lose too much fluid from the body and become dehydrated. Fluids that are lost need to be replaced. Along with a modified diet, make sure you drink enough fluids to keep your urine clear or pale yellow. °DIET INSTRUCTIONS °· Ensure adequate fluid intake (hydration): have 1 cup (8 oz) of fluid for each diarrhea episode. Avoid fluids that contain simple sugars or sports drinks, fruit juices, whole milk products, and sodas. Your urine should be clear or pale yellow if you are drinking enough fluids. Hydrate with an oral rehydration solution that you can purchase at pharmacies, retail stores, and online. You can prepare an oral rehydration solution at home by mixing the following ingredients together: °·   tsp table salt. °· ¾ tsp baking soda. °·  tsp salt substitute containing potassium chloride. °· 1  tablespoons sugar. °· 1 L (34 oz) of water. °· Certain foods and beverages may increase the speed at which food moves through the gastrointestinal (GI) tract. These foods and beverages should be avoided and include: °· Caffeinated and alcoholic beverages. °· High-fiber foods, such as raw fruits and vegetables, nuts, seeds, and whole grain breads and cereals. °· Foods and beverages sweetened with sugar alcohols, such as xylitol, sorbitol, and mannitol. °· Some foods may be well tolerated and may help thicken stool including: °· Starchy foods, such as rice, toast, pasta, low-sugar cereal, oatmeal, grits, baked potatoes, crackers, and bagels.   °· Bananas.   °· Applesauce. °· Add probiotic-rich foods to help increase healthy bacteria in the GI tract, such as yogurt and fermented milk products. °RECOMMENDED FOODS AND BEVERAGES °Starches °Choose foods with less than 2 g of fiber per serving. °· Recommended:  Raimer,  French, and pita breads, plain rolls, buns, bagels. Plain muffins, matzo. Soda, saltine, or graham crackers. Pretzels, melba toast, zwieback. Cooked cereals made with water: cornmeal, farina, cream cereals. Dry cereals: refined corn, wheat, rice. Potatoes prepared any way without skins, refined macaroni, spaghetti, noodles, refined rice. °· Avoid:  Bread, rolls, or crackers made with whole wheat, multi-grains, rye, bran seeds, nuts, or coconut. Corn tortillas or taco shells. Cereals containing whole grains, multi-grains, bran, coconut, nuts, raisins. Cooked or dry oatmeal. Coarse wheat cereals, granola. Cereals advertised as "high-fiber." Potato skins. Whole grain pasta, wild or brown rice. Popcorn. Sweet potatoes, yams. Sweet rolls, doughnuts, waffles, pancakes, sweet breads. °Vegetables °· Recommended: Strained tomato and vegetable juices. Most well-cooked and canned vegetables without seeds. Fresh: Tender lettuce, cucumber without the skin, cabbage, spinach, bean sprouts. °· Avoid: Fresh, cooked, or canned: Artichokes, baked beans, beet greens, broccoli, Brussels sprouts, corn, kale, legumes, peas, sweet potatoes. Cooked: Green or red cabbage, spinach. Avoid large servings of any vegetables because vegetables shrink when cooked, and they contain more fiber per serving than fresh vegetables. °Fruit °· Recommended: Cooked or canned: Apricots, applesauce, cantaloupe, cherries, fruit cocktail, grapefruit, grapes, kiwi, mandarin oranges, peaches, pears, plums, watermelon. Fresh: Apples without skin, ripe banana, grapes, cantaloupe, cherries, grapefruit, peaches, oranges, plums. Keep servings limited to ½ cup or 1 piece. °· Avoid: Fresh: Apples with skin, apricots, mangoes, pears, raspberries, strawberries. Prune juice, stewed or dried prunes. Dried fruits, raisins, dates. Large servings of all fresh fruits. °Protein °· Recommended: Ground or well-cooked tender beef, ham, veal, lamb, pork, or poultry. Eggs. Fish,  oysters, shrimp,   lobster, other seafoods. Liver, organ meats. °· Avoid: Tough, fibrous meats with gristle. Peanut butter, smooth or chunky. Cheese, nuts, seeds, legumes, dried peas, beans, lentils. °Dairy °· Recommended: Yogurt, lactose-free milk, kefir, drinkable yogurt, buttermilk, soy milk, or plain hard cheese. °· Avoid: Milk, chocolate milk, beverages made with milk, such as milkshakes. °Soups °· Recommended: Bouillon, broth, or soups made from allowed foods. Any strained soup. °· Avoid: Soups made from vegetables that are not allowed, cream or milk-based soups. °Desserts and Sweets °· Recommended: Sugar-free gelatin, sugar-free frozen ice pops made without sugar alcohol. °· Avoid: Plain cakes and cookies, pie made with fruit, pudding, custard, cream pie. Gelatin, fruit, ice, sherbet, frozen ice pops. Ice cream, ice milk without nuts. Plain hard candy, honey, jelly, molasses, syrup, sugar, chocolate syrup, gumdrops, marshmallows. °Fats and Oils °· Recommended: Limit fats to less than 8 tsp per day. °· Avoid: Seeds, nuts, olives, avocados. Margarine, butter, cream, mayonnaise, salad oils, plain salad dressings. Plain gravy, crisp bacon without rind. °Beverages °· Recommended: Water, decaffeinated teas, oral rehydration solutions, sugar-free beverages not sweetened with sugar alcohols. °· Avoid: Fruit juices, caffeinated beverages (coffee, tea, soda), alcohol, sports drinks, or lemon-lime soda. °Condiments °· Recommended: Ketchup, mustard, horseradish, vinegar, cocoa powder. Spices in moderation: allspice, basil, bay leaves, celery powder or leaves, cinnamon, cumin powder, curry powder, ginger, mace, marjoram, onion or garlic powder, oregano, paprika, parsley flakes, ground pepper, rosemary, sage, savory, tarragon, thyme, turmeric. °· Avoid: Coconut, honey. °Document Released: 02/01/2004 Document Revised: 08/05/2012 Document Reviewed: 03/27/2012 °ExitCare® Patient Information ©2014 [employer]. ° °Viral  Gastroenteritis °Viral gastroenteritis is also known as stomach flu. This condition affects the stomach and intestinal tract. It can cause sudden diarrhea and vomiting. The illness typically lasts 3 to 8 days. Most people develop an immune response that eventually gets rid of the virus. While this natural response develops, the virus can make you quite ill. °CAUSES  °Many different viruses can cause gastroenteritis, such as rotavirus or noroviruses. You can catch one of these viruses by consuming contaminated food or water. You may also catch a virus by sharing utensils or other personal items with an infected person or by touching a contaminated surface. °SYMPTOMS  °The most common symptoms are diarrhea and vomiting. These problems can cause a severe loss of body fluids (dehydration) and a body salt (electrolyte) imbalance. Other symptoms may include: °· Fever. °· Headache. °· Fatigue. °· Abdominal pain. °DIAGNOSIS  °Your caregiver can usually diagnose viral gastroenteritis based on your symptoms and a physical exam. A stool sample may also be taken to test for the presence of viruses or other infections. °TREATMENT  °This illness typically goes away on its own. Treatments are aimed at rehydration. The most serious cases of viral gastroenteritis involve vomiting so severely that you are not able to keep fluids down. In these cases, fluids must be given through an intravenous line (IV). °HOME CARE INSTRUCTIONS  °· Drink enough fluids to keep your urine clear or pale yellow. Drink small amounts of fluids frequently and increase the amounts as tolerated. °· Ask your caregiver for specific rehydration instructions. °· Avoid: °· Foods high in sugar. °· Alcohol. °· Carbonated drinks. °· Tobacco. °· Juice. °· Caffeine drinks. °· Extremely hot or cold fluids. °· Fatty, greasy foods. °· Too much intake of anything at one time. °· Dairy products until 24 to 48 hours after diarrhea stops. °· You may consume probiotics.  Probiotics are active cultures of beneficial bacteria. They may lessen the amount and number   of diarrheal stools in adults. Probiotics can be found in yogurt with active cultures and in supplements.  Wash your hands well to avoid spreading the virus.  Only take over-the-counter or prescription medicines for pain, discomfort, or fever as directed by your caregiver. Do not give aspirin to children. Antidiarrheal medicines are not recommended.  Ask your caregiver if you should continue to take your regular prescribed and over-the-counter medicines.  Keep all follow-up appointments as directed by your caregiver. SEEK IMMEDIATE MEDICAL CARE IF:   You are unable to keep fluids down.  You do not urinate at least once every 6 to 8 hours.  You develop shortness of breath.  You notice blood in your stool or vomit. This may look like coffee grounds.  You have abdominal pain that increases or is concentrated in one small area (localized).  You have persistent vomiting or diarrhea.  You have a fever.  The patient is a child younger than 3 months, and he or she has a fever.  The patient is a child older than 3 months, and he or she has a fever and persistent symptoms.  The patient is a child older than 3 months, and he or she has a fever and symptoms suddenly get worse.  The patient is a baby, and he or she has no tears when crying. MAKE SURE YOU:   Understand these instructions.  Will watch your condition.  Will get help right away if you are not doing well or get worse. Document Released: 11/11/2005 Document Revised: 02/03/2012 Document Reviewed: 08/28/2011 [facility] Patient Information 2014 [place]ExitCare, [place].  Viral Infections A virus is a type of germ. Viruses can cause:  Minor sore throats.  Aches and pains.  Headaches.  Runny nose.  Rashes.  Watery eyes.  Tiredness.  Coughs.  Loss of appetite.  Feeling sick to your stomach (nausea).  Throwing up  (vomiting).  Watery poop (diarrhea). HOME CARE   Only take medicines as told by your doctor.  Drink enough water and fluids to keep your pee (urine) clear or pale yellow. Sports drinks are a good choice.  Get plenty of rest and eat healthy. Soups and broths with crackers or rice are fine. GET HELP RIGHT AWAY IF:   You have a very bad headache.  You have shortness of breath.  You have chest pain or neck pain.  You have an unusual rash.  You cannot stop throwing up.  You have watery poop that does not stop.  You cannot keep fluids down.  You or your child has a temperature by mouth above 102 F (38.9 C), not controlled by medicine.  Your baby is older than 3 months with a rectal temperature of 102 F (38.9 C) or higher.  Your baby is 733 months old or younger with a rectal temperature of 100.4 F (38 C) or higher. MAKE SURE YOU:   Understand these instructions.  Will watch this condition.  Will get help right away if you are not doing well or get worse. Document Released: 10/24/2008 Document Revised: 02/03/2012 Document Reviewed: 03/19/2011 [facility] Patient Information 2014 [place]Care, [place]LL[place].

## 2014-01-15 NOTE — ED Provider Notes (Signed)
Medical screening examination/treatment/procedure(s) were performed by non-physician practitioner and as supervising physician I was immediately available for consultation/collaboration.  Tiasia Weberg, M.D.  Rhealyn Cullen C Json Koelzer, MD 01/15/14 0900 

## 2014-02-28 ENCOUNTER — Other Ambulatory Visit (HOSPITAL_COMMUNITY): Payer: Self-pay | Admitting: *Deleted

## 2014-03-01 ENCOUNTER — Encounter (HOSPITAL_COMMUNITY)
Admission: RE | Admit: 2014-03-01 | Discharge: 2014-03-01 | Disposition: A | Discharge status: Home / Self Care | Payer: Medicare Other | Source: Ambulatory Visit | Attending: Rheumatology | Admitting: Rheumatology

## 2014-03-01 DIAGNOSIS — E119 Type 2 diabetes mellitus without complications: Secondary | ICD-10-CM | POA: Insufficient documentation

## 2014-03-01 DIAGNOSIS — G252 Other specified forms of tremor: Secondary | ICD-10-CM

## 2014-03-01 DIAGNOSIS — Z5181 Encounter for therapeutic drug level monitoring: Secondary | ICD-10-CM | POA: Diagnosis present

## 2014-03-01 DIAGNOSIS — G250 Essential tremor: Secondary | ICD-10-CM | POA: Diagnosis not present

## 2014-03-01 MED ORDER — ZOLEDRONIC ACID 5 MG/100ML IV SOLN
5.0000 mg | Freq: Once | INTRAVENOUS | Status: AC
  Administered 2014-03-01: 5 mg via INTRAVENOUS

## 2014-03-01 MED ORDER — ZOLEDRONIC ACID 5 MG/100ML IV SOLN
INTRAVENOUS | Status: AC
  Filled 2014-03-01: qty 100

## 2014-03-02 ENCOUNTER — Encounter (HOSPITAL_COMMUNITY): Payer: Medicare Other

## 2014-04-08 ENCOUNTER — Encounter: Payer: Self-pay | Admitting: Neurology

## 2014-04-28 ENCOUNTER — Ambulatory Visit: Payer: Medicare Other | Admitting: Neurology

## 2014-07-18 ENCOUNTER — Inpatient Hospital Stay (HOSPITAL_COMMUNITY)
Admission: RE | Admit: 2014-07-18 | Discharge: 2014-07-18 | Disposition: A | Discharge status: Home / Self Care | Payer: Medicare Other | Source: Other Acute Inpatient Hospital | Attending: Internal Medicine | Admitting: Internal Medicine

## 2014-07-18 ENCOUNTER — Inpatient Hospital Stay (HOSPITAL_COMMUNITY): Payer: Medicare Other

## 2014-07-18 ENCOUNTER — Inpatient Hospital Stay (HOSPITAL_COMMUNITY)
Admission: AD | Admit: 2014-07-18 | Discharge: 2014-07-21 | DRG: 065 | Disposition: A | Discharge status: Rehabilitation Facility | Payer: Medicare Other | Source: Other Acute Inpatient Hospital | Attending: Internal Medicine | Admitting: Internal Medicine

## 2014-07-18 ENCOUNTER — Encounter (HOSPITAL_COMMUNITY): Payer: Self-pay | Admitting: *Deleted

## 2014-07-18 DIAGNOSIS — G250 Essential tremor: Secondary | ICD-10-CM | POA: Diagnosis present

## 2014-07-18 DIAGNOSIS — E1149 Type 2 diabetes mellitus with other diabetic neurological complication: Secondary | ICD-10-CM | POA: Diagnosis present

## 2014-07-18 DIAGNOSIS — Z6832 Body mass index (BMI) 32.0-32.9, adult: Secondary | ICD-10-CM | POA: Diagnosis not present

## 2014-07-18 DIAGNOSIS — R0989 Other specified symptoms and signs involving the circulatory and respiratory systems: Secondary | ICD-10-CM | POA: Diagnosis not present

## 2014-07-18 DIAGNOSIS — E7800 Pure hypercholesterolemia, unspecified: Secondary | ICD-10-CM | POA: Diagnosis present

## 2014-07-18 DIAGNOSIS — R0609 Other forms of dyspnea: Secondary | ICD-10-CM | POA: Diagnosis not present

## 2014-07-18 DIAGNOSIS — Z7982 Long term (current) use of aspirin: Secondary | ICD-10-CM | POA: Diagnosis not present

## 2014-07-18 DIAGNOSIS — G8190 Hemiplegia, unspecified affecting unspecified side: Secondary | ICD-10-CM | POA: Diagnosis present

## 2014-07-18 DIAGNOSIS — E669 Obesity, unspecified: Secondary | ICD-10-CM | POA: Diagnosis present

## 2014-07-18 DIAGNOSIS — F411 Generalized anxiety disorder: Secondary | ICD-10-CM | POA: Diagnosis present

## 2014-07-18 DIAGNOSIS — Z8249 Family history of ischemic heart disease and other diseases of the circulatory system: Secondary | ICD-10-CM | POA: Diagnosis not present

## 2014-07-18 DIAGNOSIS — R471 Dysarthria and anarthria: Secondary | ICD-10-CM | POA: Diagnosis present

## 2014-07-18 DIAGNOSIS — E1142 Type 2 diabetes mellitus with diabetic polyneuropathy: Secondary | ICD-10-CM | POA: Diagnosis present

## 2014-07-18 DIAGNOSIS — I6350 Cerebral infarction due to unspecified occlusion or stenosis of unspecified cerebral artery: Principal | ICD-10-CM | POA: Diagnosis present

## 2014-07-18 DIAGNOSIS — D696 Thrombocytopenia, unspecified: Secondary | ICD-10-CM | POA: Diagnosis present

## 2014-07-18 DIAGNOSIS — E1140 Type 2 diabetes mellitus with diabetic neuropathy, unspecified: Secondary | ICD-10-CM

## 2014-07-18 DIAGNOSIS — E119 Type 2 diabetes mellitus without complications: Secondary | ICD-10-CM | POA: Diagnosis present

## 2014-07-18 DIAGNOSIS — Z86718 Personal history of other venous thrombosis and embolism: Secondary | ICD-10-CM | POA: Diagnosis not present

## 2014-07-18 DIAGNOSIS — I359 Nonrheumatic aortic valve disorder, unspecified: Secondary | ICD-10-CM

## 2014-07-18 DIAGNOSIS — M79605 Pain in left leg: Secondary | ICD-10-CM | POA: Diagnosis not present

## 2014-07-18 DIAGNOSIS — I10 Essential (primary) hypertension: Secondary | ICD-10-CM | POA: Diagnosis present

## 2014-07-18 DIAGNOSIS — I639 Cerebral infarction, unspecified: Secondary | ICD-10-CM

## 2014-07-18 DIAGNOSIS — Z8669 Personal history of other diseases of the nervous system and sense organs: Secondary | ICD-10-CM | POA: Diagnosis present

## 2014-07-18 DIAGNOSIS — E785 Hyperlipidemia, unspecified: Secondary | ICD-10-CM | POA: Diagnosis present

## 2014-07-18 DIAGNOSIS — G252 Other specified forms of tremor: Secondary | ICD-10-CM

## 2014-07-18 DIAGNOSIS — M79609 Pain in unspecified limb: Secondary | ICD-10-CM | POA: Diagnosis not present

## 2014-07-18 DIAGNOSIS — I6330 Cerebral infarction due to thrombosis of unspecified cerebral artery: Secondary | ICD-10-CM | POA: Diagnosis not present

## 2014-07-18 HISTORY — DX: Cerebral infarction, unspecified: I63.9

## 2014-07-18 LAB — GLUCOSE, CAPILLARY
GLUCOSE-CAPILLARY: 105 mg/dL — AB (ref 70–99)
Glucose-Capillary: 92 mg/dL (ref 70–99)
Glucose-Capillary: 99 mg/dL (ref 70–99)

## 2014-07-18 LAB — CBC
HCT: 41.7 % (ref 36.0–46.0)
Hemoglobin: 13.9 g/dL (ref 12.0–15.0)
MCH: 30.1 pg (ref 26.0–34.0)
MCHC: 33.3 g/dL (ref 30.0–36.0)
MCV: 90.3 fL (ref 78.0–100.0)
PLATELETS: 104 10*3/uL — AB (ref 150–400)
RBC: 4.62 MIL/uL (ref 3.87–5.11)
RDW: 13.7 % (ref 11.5–15.5)
WBC: 6 10*3/uL (ref 4.0–10.5)

## 2014-07-18 LAB — LIPID PANEL
CHOL/HDL RATIO: 2 ratio
Cholesterol: 153 mg/dL (ref 0–200)
HDL: 75 mg/dL (ref >39)
LDL CALC: 62 mg/dL (ref 0–99)
Triglycerides: 78 mg/dL (ref <150)
VLDL: 16 mg/dL (ref 0–40)

## 2014-07-18 LAB — VITAMIN B12: VITAMIN B 12: 1082 pg/mL — AB (ref 211–911)

## 2014-07-18 LAB — HEMOGLOBIN A1C
Hgb A1c MFr Bld: 6 % — ABNORMAL HIGH (ref <5.7)
Mean Plasma Glucose: 126 mg/dL — ABNORMAL HIGH (ref <117)

## 2014-07-18 MED ORDER — STROKE: EARLY STAGES OF RECOVERY BOOK
Freq: Once | Status: AC
  Administered 2014-07-18: 1
  Filled 2014-07-18: qty 1

## 2014-07-18 MED ORDER — ASPIRIN EC 325 MG PO TBEC
325.0000 mg | DELAYED_RELEASE_TABLET | Freq: Every day | ORAL | Status: DC
  Administered 2014-07-19: 325 mg via ORAL
  Filled 2014-07-18 (×3): qty 1

## 2014-07-18 MED ORDER — ASPIRIN 325 MG PO TABS
ORAL_TABLET | ORAL | Status: AC
  Filled 2014-07-18: qty 1

## 2014-07-18 MED ORDER — ATORVASTATIN CALCIUM 10 MG PO TABS
20.0000 mg | ORAL_TABLET | Freq: Every day | ORAL | Status: DC
  Administered 2014-07-19 – 2014-07-20 (×2): 20 mg via ORAL
  Filled 2014-07-18 (×2): qty 2

## 2014-07-18 MED ORDER — INSULIN ASPART 100 UNIT/ML ~~LOC~~ SOLN
0.0000 [IU] | Freq: Three times a day (TID) | SUBCUTANEOUS | Status: DC
Start: 2014-07-18 — End: 2014-07-21
  Administered 2014-07-20: 1 [IU] via SUBCUTANEOUS

## 2014-07-18 MED ORDER — DOCUSATE SODIUM 100 MG PO CAPS
100.0000 mg | ORAL_CAPSULE | Freq: Two times a day (BID) | ORAL | Status: DC
  Administered 2014-07-18 – 2014-07-21 (×8): 100 mg via ORAL
  Filled 2014-07-18 (×7): qty 1

## 2014-07-18 MED ORDER — PRIMIDONE 250 MG PO TABS
250.0000 mg | ORAL_TABLET | Freq: Two times a day (BID) | ORAL | Status: DC
  Administered 2014-07-18 – 2014-07-21 (×6): 250 mg via ORAL
  Filled 2014-07-18 (×13): qty 1

## 2014-07-18 MED ORDER — ALPRAZOLAM 0.25 MG PO TABS
0.2500 mg | ORAL_TABLET | Freq: Two times a day (BID) | ORAL | Status: DC
  Administered 2014-07-18 – 2014-07-21 (×8): 0.25 mg via ORAL
  Filled 2014-07-18 (×8): qty 1

## 2014-07-18 MED ORDER — INSULIN ASPART 100 UNIT/ML ~~LOC~~ SOLN: 0.0000 [IU] | Freq: Every day | SUBCUTANEOUS | Status: DC

## 2014-07-18 MED ORDER — GABAPENTIN 300 MG PO CAPS
300.0000 mg | ORAL_CAPSULE | Freq: Every day | ORAL | Status: DC
  Administered 2014-07-18 – 2014-07-20 (×3): 300 mg via ORAL
  Filled 2014-07-18 (×3): qty 1

## 2014-07-18 MED ORDER — METFORMIN HCL 500 MG PO TABS
250.0000 mg | ORAL_TABLET | Freq: Two times a day (BID) | ORAL | Status: DC
  Filled 2014-07-18: qty 1

## 2014-07-18 MED ORDER — TRAMADOL HCL 50 MG PO TABS
50.0000 mg | ORAL_TABLET | Freq: Every day | ORAL | Status: DC
  Administered 2014-07-18 – 2014-07-20 (×3): 50 mg via ORAL
  Filled 2014-07-18 (×3): qty 1

## 2014-07-18 NOTE — Progress Notes (Signed)
Late entry: Report received via phone from [facility] [place] at 10:30 pm  Concerning pending admission of patient. Patint arrived at [facility] room 4N 23 at aproximatly 02:00:am  Md paged and admiting paged to make aware. Writer spoke to patients son who was en ropute to Hospital. Patient speaks very little english but follows commands oriented to room and safety measures put in place.

## 2014-07-18 NOTE — Consult Note (Signed)
Neurology Consultation Reason for Consult: Right-sided weakness Referring Physician: Laban Emperor.  CC: Right-sided weakness  History is obtained from: Patient, son  HPI: [name] [name] is a 80 y.o. female who was in [place], [place] visiting some family when she developed sudden onset right-sided weakness. [facility] There have been planned to give her IV TPA, however her platelets returned at 90,000 and therefore she did not receive IV TPA.  She did have some improvement with NIH improving from 7 to 3. Family she requests, she was transferred here.  Of note, the patient only speaks a little English and translation was done predominantly by the son  LKW:  08/23 4 p.m. tpa given?: no, improving symptoms, thrombocytopenia    ROS: A 14 point ROS was performed and is negative except as noted in the HPI.   Past Medical History  Diagnosis Date  . Hypertension   . High cholesterol   . DVT (deep venous thrombosis)   . Pneumonia   . Anxiety   . Essential tremor 10/27/2013  . Obesity, unspecified 10/27/2013  . Newly diagnosed diabetes 10/27/2013  . Unspecified hereditary and idiopathic peripheral neuropathy 10/27/2013    Family History: Heart disease  Social History: Tob: Denies  Exam: Current vital signs: There were no vitals filed for this visit. Vital signs in last 24 hours:    General: In bed, NAD CV: Regular rate and rhythm Mental Status: Patient is awake, alert, oriented to person, place, month, year, and situation. Immediate and remote memory are intact. Patient is able to give a clear and coherent history. No signs of neglect.  Cranial Nerves: II: Visual Fields are full. Pupils are equal, round, and reactive to light.  Discs are difficult to visualize. III,IV, VI: EOMI without ptosis or diploplia.  V: Facial sensation is symmetric to temperature VII: Facial movement is symmetric.  VIII: hearing is intact to voice X: Uvula  elevates symmetrically XI: Shoulder shrug is symmetric. XII: tongue is midline without atrophy or fasciculations.  Motor: Tone is normal. Bulk is normal. 5/5 strength was present on the left side, on the right she has 4/5 which is worse distally than proximally in the upper extremity and 4+/5 strength in the right lower extremity. She has marked impairment of fine motor movements of the right hand Sensory: Sensation is decreased in the right arm greater than leg Deep Tendon Reflexes: 2+ and symmetric in the biceps and patellae.  Plantars: Toes are downgoing bilaterally.  Cerebellar: She has a marked postural and intentional tremor on her left arm, her right arm is difficult to test due to weakness Gait: Not tested due to patient safety concerns        Labs:  Platelets 90,000  I have reviewed the images obtained: CT head-unremarkable  Impression: 80 year old female with signs and symptoms of acute ischemic infarct. She is being admitted for workup and therapy.  Recommendations: 1. HgbA1c, fasting lipid panel 2. MRI, MRA  of the brain without contrast 3. Frequent neuro checks 4. Echocardiogram 5. Carotid dopplers 6. Prophylactic therapy-Antiplatelet med: Aspirin - dose  PO or  PR 7. Risk factor modification 8. Telemetry monitoring 9. PT consult, OT consult, Speech consult 10. Continue home primidone   Ritta Slot, MD [facility] [phone]  If 7pm- 7am, please page neurology on call as listed in AMION.

## 2014-07-18 NOTE — Progress Notes (Signed)
PROGRESS NOTE  NYA BALAM BMW:[id number] DOB: 08/27/1930 DOA: 07/18/2014 PCP: Gwynneth Aliment, MD  Interim summary 80 year old female with a history of hypertension, essential tremor, diabetes mellitus, hyperlipidemia, DVT presented to the hospital in [place], [place] she experienced sudden onset of right-sided weakness, facial droop, slurred speech. The patient was not given IV TPA due to her thrombocytopenia and improving symptoms. She wanted to be transferred to [facility] for further workup. Neurology was consulted to see the patient. Assessment/Plan: Right hemiparesis/dysarthria -Concerned about stroke -Appreciate neurology consult -MRI brain/MRA brain -Echocardiogram -Carotid duplex -Hemoglobin A1c -LDL 62 -continue aspirin 325 mg daily -PT/OT Thrombocytopenia -This appears to be chronic dating back to 2009 -serum B12, RBC folate -No active bleeding presently Diabetes mellitus type 2 -Discontinue metformin -I do not plan to restart metformin as the patient's estimated creatinine clearance is approx 50 -NovoLog sliding scale Dyspnea -Chest x-ray -07/17/2014 EKG shows sinus rhythm without any ST changes Hyperlipidemia -Continue statin Hypertension -Atenolol and diltiazem have been held -blood pressure remains stable off of antihypertensive medications -Continue to monitor    Family Communication:   Pt at beside Disposition Plan:   Home when medically stable     Procedures/Studies:  No results found.      Subjective: Patient complains of some shortness of breath but denies any fevers, chills, chest pain, nausea, vomiting, abdominal pain.  Objective: Filed Vitals:   07/18/14 0400 07/18/14 0600 07/18/14 0807 07/18/14 0954  BP: 117/53 125/54 128/60 128/52  Pulse: 59 62 62 64  Temp: 98.4 F (36.9 C) 98.1 F (36.7 C) 98.1 F (36.7 C) 98.2 F (36.8 C)  TempSrc: Oral Oral Oral Oral  Resp: 16 18 20 18   Height:      Weight:      SpO2:  98% 98% 97% 93%    Intake/Output Summary (Last 24 hours) at 07/18/14 1212 Last data filed at 07/18/14 0843  Gross per 24 hour  Intake    240 ml  Output      0 ml  Net    240 ml   Weight change:  Exam:   General:  Pt is alert, follows commands appropriately, not in acute distress  HEENT: No icterus, No thrush, [place]/AT  Cardiovascular: RRR, S1/S2, no rubs, no gallops  Respiratory: CTA bilaterally, no wheezing, no crackles, no rhonchi  Abdomen: Soft/+BS, non tender, non distended, no guarding  Extremities: No edema, No lymphangitis, No petechiae, No rashes, no synovitis  Data Reviewed: Basic Metabolic Panel: No results found for this basename: NA, K, CL, CO2, GLUCOSE, BUN, CREATININE, CALCIUM, MG, PHOS,  in the last 168 hours Liver Function Tests: No results found for this basename: AST, ALT, ALKPHOS, BILITOT, PROT, ALBUMIN,  in the last 168 hours No results found for this basename: LIPASE, AMYLASE,  in the last 168 hours No results found for this basename: AMMONIA,  in the last 168 hours CBC:  Recent Labs Lab 07/18/14 0422  WBC 6.0  HGB 13.9  HCT 41.7  MCV 90.3  PLT 104*   Cardiac Enzymes: No results found for this basename: CKTOTAL, CKMB, CKMBINDEX, TROPONINI,  in the last 168 hours BNP: No components found with this basename: POCBNP,  CBG:  Recent Labs Lab 07/18/14 0650  GLUCAP 92    No results found for this or any previous visit (from the past 240 hour(s)).   Scheduled Meds: . ALPRAZolam  0.25 mg Oral BID  . aspirin EC  325 mg  Oral Daily  . atorvastatin  20 mg Oral q1800  . docusate sodium  100 mg Oral BID  . gabapentin  300 mg Oral QHS  . primidone  250 mg Oral BID WC  . traMADol  50 mg Oral QHS   Continuous Infusions:    Rannie Craney, DO  [facility] Pager [phone]  If 7PM-7AM, please contact night-coverage [email or web address] Password [email or web address] 07/18/2014, 12:12 PM   LOS: 0 days

## 2014-07-18 NOTE — H&P (Signed)
[facility] History and Physical  KALLE BERNATH WGN:[id number] DOB: 05-31-1930 DOA: 07/18/2014  Referring physician: EDP PCP: Gwynneth Aliment, MD   Chief Complaint: Stroke   HPI: [name] [name] is a 80 y.o. female who was in [place]  today visiting family when at ~6pm or so she suffered onset of R sided weakness, facial droop, slurred speech.  Family immediately recognized signs and symptoms of stroke and called 911.  Patient brought to hospital in [place] where she was a code stroke.  CT head negative for bleed, but she was not given TPA secondary to a platelet count of only 90 on her lab work.  Her slurred speech and R sided weakness has improved somewhat since her initial presentation according to her son, but she has persistent deficits.  Review of Systems: Systems reviewed.  As above, otherwise negative  Past Medical History  Diagnosis Date  . Hypertension   . High cholesterol   . DVT (deep venous thrombosis)   . Pneumonia   . Anxiety   . Essential tremor 10/27/2013  . Obesity, unspecified 10/27/2013  . Newly diagnosed diabetes 10/27/2013  . Unspecified hereditary and idiopathic peripheral neuropathy 10/27/2013   Past Surgical History  Procedure Laterality Date  . Foot surgery    . Cataract extraction Bilateral    Social History:  reports that she has never smoked. She does not have any smokeless tobacco history on file. She reports that she does not drink alcohol or use illicit drugs.  No Known Allergies  Family History  Problem Relation Age of Onset  . Coronary artery disease Other      Prior to Admission medications   Medication Sig Start Date End Date Taking? Authorizing Provider  ALPRAZolam (XANAX) 0.25 MG tablet Take 0.25 mg by mouth 2 (two) times daily.    Historical Provider, MD  aspirin 81 MG tablet Take 81 mg by mouth daily.    Historical Provider, MD  atenolol (TENORMIN) 50 MG tablet Take 25 mg by mouth 2 (two) times daily.    Historical  Provider, MD  atorvastatin (LIPITOR) 20 MG tablet Take 20 mg by mouth daily.    Historical Provider, MD  diltiazem (CARDIZEM) 30 MG tablet Take 30 mg by mouth 2 (two) times daily.    Historical Provider, MD  docusate sodium (COLACE) 100 MG capsule Take 1 capsule (100 mg total) by mouth 2 (two) times daily. 03/17/13   Jennifer L Piepenbrink, PA-C  furosemide (LASIX) 20 MG tablet Take 20 mg by mouth daily.    Historical Provider, MD  gabapentin (NEURONTIN) 100 MG capsule Take 3 capsules (300 mg total) by mouth at bedtime. 10/27/13   Huston Foley, MD  metFORMIN (GLUCOPHAGE) 500 MG tablet Take 250 mg by mouth 2 (two) times daily with a meal.    Historical Provider, MD  potassium chloride (K-DUR,KLOR-CON) 10 MEQ tablet Take 10 mEq by mouth daily.    Historical Provider, MD  primidone (MYSOLINE) 250 MG tablet Take 1 tablet (250 mg total) by mouth 2 (two) times daily. 10/27/13   Huston Foley, MD  sulfamethoxazole-trimethoprim (SEPTRA DS) 800-160 MG per tablet Take 1 tablet by mouth 2 (two) times daily. 03/17/13   Jennifer L Piepenbrink, PA-C  traMADol (ULTRAM) 50 MG tablet Take 50 mg by mouth at bedtime.    Historical Provider, MD   Physical Exam: There were no vitals filed for this visit.  There were no vitals taken for this visit.  General Appearance:    Alert, oriented, no  distress, appears stated age  Head:    Normocephalic, atraumatic  Eyes:    PERRL, EOMI, sclera non-icteric        Nose:   Nares without drainage or epistaxis. Mucosa, turbinates normal  Throat:   Moist mucous membranes. Oropharynx without erythema or exudate.  Neck:   Supple. No carotid bruits.  No thyromegaly.  No lymphadenopathy.   Back:     No CVA tenderness, no spinal tenderness  Lungs:     Clear to auscultation bilaterally, without wheezes, rhonchi or rales  Chest wall:    No tenderness to palpitation  Heart:    Regular rate and rhythm without murmurs, gallops, rubs  Abdomen:     Soft, non-tender, nondistended, normal bowel  sounds, no organomegaly  Genitalia:    deferred  Rectal:    deferred  Extremities:   No clubbing, cyanosis or edema.  Pulses:   2+ and symmetric all extremities  Skin:   Skin color, texture, turgor normal, no rashes or lesions  Lymph nodes:   Cervical, supraclavicular, and axillary nodes normal  Neurologic:   CNII-XII intact. Speech with some slurring, distal > proximal RUE and RLE weakness with loss of coordination in the hand persists.    Labs on Admission:  Basic Metabolic Panel: No results found for this basename: NA, K, CL, CO2, GLUCOSE, BUN, CREATININE, CALCIUM, MG, PHOS,  in the last 168 hours Liver Function Tests: No results found for this basename: AST, ALT, ALKPHOS, BILITOT, PROT, ALBUMIN,  in the last 168 hours No results found for this basename: LIPASE, AMYLASE,  in the last 168 hours No results found for this basename: AMMONIA,  in the last 168 hours CBC: No results found for this basename: WBC, NEUTROABS, HGB, HCT, MCV, PLT,  in the last 168 hours Cardiac Enzymes: No results found for this basename: CKTOTAL, CKMB, CKMBINDEX, TROPONINI,  in the last 168 hours  BNP (last 3 results) No results found for this basename: PROBNP,  in the last 8760 hours CBG: No results found for this basename: GLUCAP,  in the last 168 hours  Radiological Exams on Admission: No results found.  EKG: Independently reviewed.  Assessment/Plan Principal Problem:   Acute ischemic stroke Active Problems:   Essential tremor   DM2 (diabetes mellitus, type 2)   Unspecified hereditary and idiopathic peripheral neuropathy   HTN (hypertension)   HLD (hyperlipidemia)   Thrombocytopenia, unspecified   1. Acute ischemic stroke -  1. Stroke pathway 2. MRI/MRA brain, carotid dopplers, and 2d echo 3. A1C and lipid panel 4. ASA 325 daily in acute stroke setting (do this despite mild thrombocytopenia) 5. PT/OT/SLP 6. Permissive HTN 2. Thrombocytopenia - very mild with platelet count of 90, recheck  CBC in AM, but review of prior labs suggest she has a chronic mild thrombocytopenia. 3. DM2 - continue home low dose metformin, CBG checks AC/HS 4. HTN - holding beta blocker, CCB, and lasix and allowing permissive HTN in setting of acute ischemic stroke 5. HLD - continue statin 6. Essential tremor - continue mysoline 7. Diabetic neuropathy - continue neurontin  Dr. Amada Jupiter has seen patient in consult  Code Status: Full  Family Communication: Son at bedside Disposition Plan: Admit to inpatient   Time spent: 70 min  GARDNER, JARED M. [facility] Pager [phone]  If 7AM-7PM, please contact the day team taking care of the patient [email or web address] Password [email or web address] 07/18/2014, 2:34 AM

## 2014-07-18 NOTE — Progress Notes (Signed)
Echo Lab  2D Echocardiogram completed.  Jaidyn Usery L Monta Police, RDCS 07/18/2014 12:01 PM

## 2014-07-18 NOTE — Progress Notes (Signed)
VASCULAR LAB PRELIMINARY  PRELIMINARY  PRELIMINARY  PRELIMINARY  Carotid Dopplers completed.    Preliminary report:  1-39% ICA stenosis.  Vertebral artery flow is antegrade.  Jeancarlo Leffler, RVT 07/18/2014, 2:46 PM

## 2014-07-19 ENCOUNTER — Inpatient Hospital Stay (HOSPITAL_COMMUNITY): Payer: Medicare Other

## 2014-07-19 DIAGNOSIS — I639 Cerebral infarction, unspecified: Secondary | ICD-10-CM | POA: Diagnosis present

## 2014-07-19 LAB — BASIC METABOLIC PANEL
ANION GAP: 11 (ref 5–15)
BUN: 14 mg/dL (ref 6–23)
CALCIUM: 8.6 mg/dL (ref 8.4–10.5)
CO2: 25 mEq/L (ref 19–32)
CREATININE: 0.72 mg/dL (ref 0.50–1.10)
Chloride: 104 mEq/L (ref 96–112)
GFR calc non Af Amer: 78 mL/min — ABNORMAL LOW (ref >90)
Glucose, Bld: 86 mg/dL (ref 70–99)
Potassium: 3.8 mEq/L (ref 3.7–5.3)
Sodium: 140 mEq/L (ref 137–147)

## 2014-07-19 LAB — FOLATE RBC: RBC Folate: 716 ng/mL — ABNORMAL HIGH (ref >280)

## 2014-07-19 LAB — GLUCOSE, CAPILLARY
GLUCOSE-CAPILLARY: 96 mg/dL (ref 70–99)
Glucose-Capillary: 90 mg/dL (ref 70–99)
Glucose-Capillary: 106 mg/dL — ABNORMAL HIGH (ref 70–99)
Glucose-Capillary: 90 mg/dL (ref 70–99)
Glucose-Capillary: 86 mg/dL (ref 70–99)

## 2014-07-19 LAB — CBC
HCT: 41.8 % (ref 36.0–46.0)
Hemoglobin: 14 g/dL (ref 12.0–15.0)
MCH: 30.2 pg (ref 26.0–34.0)
MCHC: 33.5 g/dL (ref 30.0–36.0)
MCV: 90.3 fL (ref 78.0–100.0)
Platelets: 98 10*3/uL — ABNORMAL LOW (ref 150–400)
RBC: 4.63 MIL/uL (ref 3.87–5.11)
RDW: 13.8 % (ref 11.5–15.5)
WBC: 4.1 10*3/uL (ref 4.0–10.5)

## 2014-07-19 LAB — HEPATITIS C ANTIBODY: HCV AB: NEGATIVE

## 2014-07-19 LAB — HEPATITIS B SURFACE ANTIGEN: Hepatitis B Surface Ag: NEGATIVE

## 2014-07-19 NOTE — Progress Notes (Signed)
STROKE TEAM PROGRESS NOTE   HISTORY [name] [name] is a 80 y.o. female who was in [place], [place] visiting some family when she developed sudden onset right-sided weakness. She presented to [facility]. They planned to give her IV TPA, however her platelets were 90,000 and therefore she did not receive IV TPA. She did have some improvement with NIH improving from 7 to 3. At family's request, she was transferred to [facility]. Of note, the patient only speaks a little English and translation was done predominantly by the son.    SUBJECTIVE (INTERVAL HISTORY) No family is at the bedside.  Overall she feels her condition is unchanged. She still have some left sided weakness.   OBJECTIVE Temp:  [97.7 F (36.5 C)-98.9 F (37.2 C)] 97.7 F (36.5 C) (08/25 0641) Pulse Rate:  [61-69] 61 (08/25 0641) Cardiac Rhythm:  [-] Normal sinus rhythm (08/24 2100) Resp:  [16-18] 18 (08/25 0641) BP: (120-138)/(47-61) 131/61 mmHg (08/25 0641) SpO2:  [95 %-99 %] 97 % (08/25 0641)   Recent Labs Lab 07/18/14 0650 07/18/14 1212 07/18/14 1710 07/18/14 1955 07/19/14 0642  GLUCAP 92 99 105* 96 90    Recent Labs Lab 07/19/14 0545  NA 140  K 3.8  CL 104  CO2 25  GLUCOSE 86  BUN 14  CREATININE 0.72  CALCIUM 8.6    Recent Labs Lab 07/18/14 0422 07/19/14 0545  WBC 6.0 4.1  HGB 13.9 14.0  HCT 41.7 41.8  MCV 90.3 90.3  PLT 104* 98*       Component Value Date/Time   CHOL 153 07/18/2014 0422   TRIG 78 07/18/2014 0422   HDL 75 07/18/2014 0422   CHOLHDL 2.0 07/18/2014 0422   VLDL 16 07/18/2014 0422   LDLCALC 62 07/18/2014 0422   Lab Results  Component Value Date   HGBA1C 6.0* 07/18/2014     Dg Chest 2 View  07/18/2014   CLINICAL DATA:  Dyspnea.  EXAM: CHEST  2 VIEW  COMPARISON:  March 17, 2013.  FINDINGS: Stable cardiomediastinal silhouette. No pneumothorax or pleural effusion is noted. Degenerative disc disease is noted in the lower thoracic spine. No acute pulmonary disease  is noted.  IMPRESSION: No acute cardiopulmonary abnormality seen.   Electronically Signed   By: Roque Lias M.D.   On: 07/18/2014 14:10   Mr Brain Wo Contrast  07/19/2014   ADDENDUM REPORT: 07/19/2014 13:19  ADDENDUM: Study discussed by telephone with Dr. Onalee Hua Tat on 07/19/2014 at 1254 hrs.   Electronically Signed   By: Augusto Gamble M.D.   On: 07/19/2014 13:19   07/19/2014   CLINICAL DATA:  80 year old female with acute onset right side weakness facial droop and slurred speech. Code stroke. Improved symptoms, patient was not a candidate for tPA. Initial encounter.  EXAM: MRI HEAD WITHOUT CONTRAST  MRA HEAD WITHOUT CONTRAST  TECHNIQUE: Multiplanar, multiecho pulse sequences of the brain and surrounding structures were obtained without intravenous contrast. Angiographic images of the head were obtained using MRA technique without contrast.  COMPARISON:  Cervical spine MRI 03/16/2013.  FINDINGS: MRI HEAD FINDINGS  Oval 14 mm area of restricted diffusion in the lateral left thalamus abutting the posterior limb of the left internal capsule. T2 and FLAIR hyperintensity. No hemorrhage or mass effect. No other restricted diffusion identified. Major intracranial vascular flow voids are preserved. No midline shift, mass effect, evidence of mass lesion, ventriculomegaly, extra-axial collection or acute intracranial hemorrhage. Cervicomedullary junction and pituitary are within normal limits. Negative for age visualized  cervical spine. Outside of the acute findings, normal for age gray and white matter signal throughout the brain. Visible internal auditory structures appear normal. Mastoids are clear. Minor paranasal sinus mucosal thickening. Postoperative changes to the globes. Visualized scalp soft tissues are within normal limits. Normal bone marrow signal.  MRA HEAD FINDINGS  Antegrade flow in the posterior circulation with codominant distal vertebral arteries. Normal vertebrobasilar junction. Normal right PICA origin.  Left AICA may be dominant. Mildly tortuous basilar artery, no stenosis. SCA and PCA origins are within normal limits.  The left PCAs occluded in the P2 segment, with no distal left PCA flow (series 605, image 18). Posterior communicating arteries are diminutive or absent. Right PCA branches are within normal limits.  Antegrade flow in both ICA siphons. Mild tortuosity but no siphon stenosis identified. Ophthalmic artery origins are within normal limits. Carotid termini are patent. MCA and ACA origins are within normal limits. Anterior communicating artery is normal. Median artery of the corpus callosum is present. Visualized bilateral ACA branches are within normal limits. Visualized bilateral MCA branches are within normal limits.  IMPRESSION: 1. Acute lacunar infarct (14 mm) in the lateral left thalamus near the posterior limb of the left internal capsule. No mass effect or hemorrhage. 2. The left PCA is occluded in the P2 segment, near the expected location of the left thalamostriate artery origins. 3. Otherwise negative intracranial MRA, and otherwise normal for age non contrast MRI appearance of the brain.  Electronically Signed: By: Augusto Gamble M.D. On: 07/19/2014 12:22   Mr Maxine Glenn Head/brain Wo Cm  07/19/2014   ADDENDUM REPORT: 07/19/2014 13:19  ADDENDUM: Study discussed by telephone with Dr. Onalee Hua Tat on 07/19/2014 at 1254 hrs.   Electronically Signed   By: Augusto Gamble M.D.   On: 07/19/2014 13:19   07/19/2014   CLINICAL DATA:  80 year old female with acute onset right side weakness facial droop and slurred speech. Code stroke. Improved symptoms, patient was not a candidate for tPA. Initial encounter.  EXAM: MRI HEAD WITHOUT CONTRAST  MRA HEAD WITHOUT CONTRAST  TECHNIQUE: Multiplanar, multiecho pulse sequences of the brain and surrounding structures were obtained without intravenous contrast. Angiographic images of the head were obtained using MRA technique without contrast.  COMPARISON:  Cervical spine MRI  03/16/2013.  FINDINGS: MRI HEAD FINDINGS  Oval 14 mm area of restricted diffusion in the lateral left thalamus abutting the posterior limb of the left internal capsule. T2 and FLAIR hyperintensity. No hemorrhage or mass effect. No other restricted diffusion identified. Major intracranial vascular flow voids are preserved. No midline shift, mass effect, evidence of mass lesion, ventriculomegaly, extra-axial collection or acute intracranial hemorrhage. Cervicomedullary junction and pituitary are within normal limits. Negative for age visualized cervical spine. Outside of the acute findings, normal for age gray and white matter signal throughout the brain. Visible internal auditory structures appear normal. Mastoids are clear. Minor paranasal sinus mucosal thickening. Postoperative changes to the globes. Visualized scalp soft tissues are within normal limits. Normal bone marrow signal.  MRA HEAD FINDINGS  Antegrade flow in the posterior circulation with codominant distal vertebral arteries. Normal vertebrobasilar junction. Normal right PICA origin. Left AICA may be dominant. Mildly tortuous basilar artery, no stenosis. SCA and PCA origins are within normal limits.  The left PCAs occluded in the P2 segment, with no distal left PCA flow (series 605, image 18). Posterior communicating arteries are diminutive or absent. Right PCA branches are within normal limits.  Antegrade flow in both ICA siphons. Mild tortuosity but  no siphon stenosis identified. Ophthalmic artery origins are within normal limits. Carotid termini are patent. MCA and ACA origins are within normal limits. Anterior communicating artery is normal. Median artery of the corpus callosum is present. Visualized bilateral ACA branches are within normal limits. Visualized bilateral MCA branches are within normal limits.  IMPRESSION: 1. Acute lacunar infarct (14 mm) in the lateral left thalamus near the posterior limb of the left internal capsule. No mass effect  or hemorrhage. 2. The left PCA is occluded in the P2 segment, near the expected location of the left thalamostriate artery origins. 3. Otherwise negative intracranial MRA, and otherwise normal for age non contrast MRI appearance of the brain.  Electronically Signed: By: Augusto Gamble M.D. On: 07/19/2014 12:22    PHYSICAL EXAM Elderly [place] origin lady mildly orthopnoic in bed. Has mild tremor and asterixis right greater than left hand.Awake alert. Afebrile. Head is nontraumatic. Neck is supple without bruit. Hearing is normal. Cardiac exam no murmur or gallop. Lungs are clear to auscultation. Distal pulses are well felt. Neurological Exam : Awake alert oriented x3. Slightly diminished attention registration and recall. Extraocular movements are full range without nystagmus. Speech is normal without dysarthria or aphasia. Fundi were not visualized. Vision acuity and fields appear normal. Face is symmetric without weakness. Tongue is midline. Motor system exam revealed no drift but mild weakness of the right grip and intrinsic hand muscles and right hip flexors and ankle dorsiflexors 4/5. Orbits left-to-right upper extremity. Diminished touch pinprick sensation on the right hemibody and lower face. Position sense and vibration sense preserved. Slightly impaired finger-to-nose coordination on the right. Gait was not tested. ASSESSMENT/PLAN  Ms. AALIYAN BRINKMEIER is a 80 y.o. female transferred to [facility] from [facility] where she presented with right hemiparesis. She did not receive IV t-PA there due to low platelets. Imaging confirms a left lateral thalamus/PLIC infarct etiology likely small vessel disease. Stroke work up underway.  Stroke:  left lateral thalamus/PLIC infarct   aspirin 81 mg orally every day prior to admission, now on aspirin 325 mg orally every day  MRI left lateral thalamus/PLIC infarct  MRA left PCA occlusion P2 segment  2D Echo  No source of embolus   Carotid no  significant stensosis  SCDs for VTE prophylaxis    heart healthy/carb modified liquids.   OOB with assistance  Therapy needs:  CIR  Risk factor management/education  Disposition:  CIR consult in place  Hypertension   Home meds:  Tenormin, cardizem, lasix. Not resumed in hospital  BP 120-138/47-59 past 24h  Stable off meds  Hyperlipidemia  LDL 62   Patient on lipitor 20 at home, resumed in hospital  At LDL goal <70 for diabetics  Diabetes  HgbA1c 6.0   Controlled  At Goal < 7.0  Other Stroke Risk Factors Advanced age   Obesity, Body mass index is 32.9 kg/(m^2).   Other Active Problems  Dyspnea, cxr neg, ekg ok  Other Pertinent History  Thrombocytopenia, chronic dating back to 2009  Hospital day # 1  Annie Main, MSN, RN, ANVP-BC, ANP-BC, [facility] Pager: [phone] 07/19/2014 5:38 PM  I have personally examined this patient, reviewed notes, independently viewed imaging studies, participated in medical decision making and plan of care. I have made any additions or clarifications directly to the above note. Agree with note above.  She is at risk of neurological worsening and recurrent stroke and needs  Risk stratification workup.  Delia Heady, MD Medical Director  [facility] Pager: [phone] 07/19/2014 6:16 PM    To contact Stroke Continuity provider, please refer to [email or web address]. After hours, contact General Neurology

## 2014-07-19 NOTE — Evaluation (Signed)
Physical Therapy Evaluation Patient Details Name: [name] [name] MRN: [id number] DOB: July 19, 1930 Today's Date: 07/19/2014   History of Present Illness  80 y.o. female admitted to [facility] on 07/18/14 with R sided weakness, facial droop.  She was transported from [facility] to [facility] per pt and family request.  Stroke workup in progress.  MRI revealed, "Acute lacunar infarct (14 mm) in the lateral left thalamus near the posterior limb of the left internal capsule.The left PCA is occluded."  Pt with significant PMHx of HTN, DVT, anxiety, essential tremor, DM, peripheral neuropathy, and foot surgery.   Clinical Impression  Pt is mobilizing with a buckling/weak leg on the right.  Pt would benefit from extensive inpatient rehab before returning home (with family's assist?).  Try RW next session.  PT to follow acutely for deficits listed below.       Follow Up Recommendations CIR    Equipment Recommendations  Rolling walker with 5" wheels    Recommendations for Other Services Rehab consult     Precautions / Restrictions Precautions Precautions: Fall Precaution Comments: pt very unsteady on her feet.  Restrictions Weight Bearing Restrictions: No      Mobility  Bed Mobility Overal bed mobility: Needs Assistance Bed Mobility: Supine to Sit     Supine to sit: Min assist     General bed mobility comments: Min assist to support her trunk when transitioning to sitting.  Pt is using her bil arms to pull up against the bed rail on her bed for the transitio.   Transfers Overall transfer level: Needs assistance Equipment used: 1 person hand held assist;2 person hand held assist Transfers: Sit to/from Stand Sit to Stand: Min assist         General transfer comment: Min assist to stand from both the bed and the toilet.  Assist needed to help pt power up over her legs and for balance as pt has a tendancy for posterior LOB.   Ambulation/Gait Ambulation/Gait assistance: +2 physical  assistance;Mod assist Ambulation Distance (Feet): 15 Feet Assistive device: 2 person hand held assist Gait Pattern/deviations: Decreased step length - right;Decreased stance time - right Gait velocity: decreased   General Gait Details: Pt with buckling when WB on right leg during gait requiring two person mod assist for gait to the bathroom and back to her chair.  Pt reaching, when able, with bil upper extremities to support surfaces for balance and stability.       Modified Rankin (Stroke Patients Only) Modified Rankin (Stroke Patients Only) Pre-Morbid Rankin Score:  (unknown) Modified Rankin: Moderately severe disability     Balance Overall balance assessment: Needs assistance Sitting-balance support: Feet supported;No upper extremity supported Sitting balance-Leahy Scale: Good     Standing balance support: Bilateral upper extremity supported;Single extremity supported Standing balance-Leahy Scale: Poor                               Pertinent Vitals/Pain Pain Assessment: No/denies pain    Home Living Family/patient expects to be discharged to:: Private residence                 Additional Comments: Pt does not speak English well.  No family present to confirm living situation.     Prior Function           Comments: No family available to report.         Extremity/Trunk Assessment   Upper Extremity Assessment: Defer to  OT evaluation           Lower Extremity Assessment: RLE deficits/detail (difficult to assess due to language barrier.  ) RLE Deficits / Details: right leg is generally weak and gives away during gait.      Cervical / Trunk Assessment: Normal  Communication   Communication: Prefers language other than English  Cognition Arousal/Alertness: Awake/alert Behavior During Therapy: WFL for tasks assessed/performed Overall Cognitive Status: No family/caregiver present to determine baseline cognitive functioning                                Assessment/Plan    PT Assessment Patient needs continued PT services  PT Diagnosis Difficulty walking;Abnormality of gait;Generalized weakness;Hemiplegia dominant side   PT Problem List Decreased strength;Decreased activity tolerance;Decreased balance;Decreased mobility;Decreased knowledge of use of DME  PT Treatment Interventions DME instruction;Gait training;Stair training;Functional mobility training;Therapeutic activities;Therapeutic exercise;Neuromuscular re-education;Balance training;Patient/family education   PT Goals (Current goals can be found in the Care Plan section) Acute Rehab PT Goals Patient Stated Goal: none stated PT Goal Formulation: Patient unable to participate in goal setting Time For Goal Achievement: 08/02/14 Potential to Achieve Goals: Good    Frequency Min 4X/week    End of Session Equipment Utilized During Treatment: Gait belt Activity Tolerance: Patient tolerated treatment well Patient left: in chair;with call bell/phone within reach;with chair alarm set           Time: 1135-1153 PT Time Calculation (min): 18 min   Charges:   PT Evaluation $Initial PT Evaluation Tier I: 1 Procedure PT Treatments $Gait Training: 8-22 mins        Atira Borello B. Ana Woodroof, PT, DPT [phone]   07/19/2014, 2:51 PM

## 2014-07-19 NOTE — Progress Notes (Signed)
Rehab Admissions Coordinator Note:  Patient was screened by [name] for appropriateness for an Inpatient Acute Rehab Consult per PT recommendation.  At this time, we are recommending Inpatient Rehab consult. I will contact Dr. Arbutus Leas for order.  [name] 07/19/2014, 3:53 PM  I can be reached at [phone].

## 2014-07-19 NOTE — Progress Notes (Signed)
Utilization Review Completed.[name] T8/25/2015  

## 2014-07-19 NOTE — Progress Notes (Addendum)
PROGRESS NOTE  [name] [name] ZOX:[id number] DOB: 1930-09-14 DOA: 07/18/2014 PCP: Gwynneth Aliment, MD  Interim summary  80 year old female with a history of hypertension, essential tremor, diabetes mellitus, hyperlipidemia, DVT presented to the hospital in [place], [place] she experienced sudden onset of right-sided weakness, facial droop, slurred speech. The patient was not given IV TPA due to her thrombocytopenia and improving symptoms. She wanted to be transferred to [facility] for further workup. Neurology was consulted to see the patient.  MRI of brain reveals a left thalamic stroke consistent with the patient's symptoms Assessment/Plan:  Acute thalamic stroke -Appreciate neurology followup -MRI brain-acute left thalamic lacunar infarct -MRA brain--left PCA occlusion, out of proportion with small size of infarct -Echocardiogram--EF 60-65%, grade 1 diastolic dysfunction, no WMA  -Carotid duplex--negative for hemodynamically significant stenosis -Hemoglobin A1c--6.0  -LDL 62  -continue aspirin 811 mg daily  -PT/OT  Thrombocytopenia  -This appears to be chronic dating back to 2009  -serum B12--1082 -RBC folate--pending  -No active bleeding presently  -hep B and C serology neg Diabetes mellitus type 2  -Discontinue metformin  -I do not plan to restart metformin as the patient's estimated creatinine clearance is approx 50  -in addition, given the patient's age, hemoglobin A1c 6.0-I would allow for more liberal glycemic control -NovoLog sliding scale  Dyspnea  -Chest x-ray--neg  -07/17/2014 EKG shows sinus rhythm without any ST changes  Hyperlipidemia  -Continue statin  Hypertension  -Atenolol and diltiazem have been held  -blood pressure remains stable off of antihypertensive medications  -Continue to monitor   Family Communication:   Son updated at beside Disposition Plan:   Home when medically stable       Procedures/Studies: Dg Chest 2  View  07/18/2014   CLINICAL DATA:  Dyspnea.  EXAM: CHEST  2 VIEW  COMPARISON:  March 17, 2013.  FINDINGS: Stable cardiomediastinal silhouette. No pneumothorax or pleural effusion is noted. Degenerative disc disease is noted in the lower thoracic spine. No acute pulmonary disease is noted.  IMPRESSION: No acute cardiopulmonary abnormality seen.   Electronically Signed   By: Roque Lias M.D.   On: 07/18/2014 14:10   Mr Brain Wo Contrast  07/19/2014   CLINICAL DATA:  80 year old female with acute onset right side weakness facial droop and slurred speech. Code stroke. Improved symptoms, patient was not a candidate for tPA. Initial encounter.  EXAM: MRI HEAD WITHOUT CONTRAST  MRA HEAD WITHOUT CONTRAST  TECHNIQUE: Multiplanar, multiecho pulse sequences of the brain and surrounding structures were obtained without intravenous contrast. Angiographic images of the head were obtained using MRA technique without contrast.  COMPARISON:  Cervical spine MRI 03/16/2013.  FINDINGS: MRI HEAD FINDINGS  Oval 14 mm area of restricted diffusion in the lateral left thalamus abutting the posterior limb of the left internal capsule. T2 and FLAIR hyperintensity. No hemorrhage or mass effect. No other restricted diffusion identified. Major intracranial vascular flow voids are preserved. No midline shift, mass effect, evidence of mass lesion, ventriculomegaly, extra-axial collection or acute intracranial hemorrhage. Cervicomedullary junction and pituitary are within normal limits. Negative for age visualized cervical spine. Outside of the acute findings, normal for age gray and white matter signal throughout the brain. Visible internal auditory structures appear normal. Mastoids are clear. Minor paranasal sinus mucosal thickening. Postoperative changes to the globes. Visualized scalp soft tissues are within normal limits. Normal bone marrow signal.  MRA HEAD FINDINGS  Antegrade flow in the posterior circulation with codominant distal  vertebral arteries. Normal vertebrobasilar junction. Normal right PICA origin. Left AICA may be dominant. Mildly tortuous basilar artery, no stenosis. SCA and PCA origins are within normal limits.  The left PCAs occluded in the P2 segment, with no distal left PCA flow (series 605, image 18). Posterior communicating arteries are diminutive or absent. Right PCA branches are within normal limits.  Antegrade flow in both ICA siphons. Mild tortuosity but no siphon stenosis identified. Ophthalmic artery origins are within normal limits. Carotid termini are patent. MCA and ACA origins are within normal limits. Anterior communicating artery is normal. Median artery of the corpus callosum is present. Visualized bilateral ACA branches are within normal limits. Visualized bilateral MCA branches are within normal limits.  IMPRESSION: 1. Acute lacunar infarct (14 mm) in the lateral left thalamus near the posterior limb of the left internal capsule. No mass effect or hemorrhage. 2. The left PCA is occluded in the P2 segment, near the expected location of the left thalamostriate artery origins. 3. Otherwise negative intracranial MRA, and otherwise normal for age non contrast MRI appearance of the brain.   Electronically Signed   By: Augusto Gamble M.D.   On: 07/19/2014 12:22   Mr Maxine Glenn Head/brain Wo Cm  07/19/2014   CLINICAL DATA:  80 year old female with acute onset right side weakness facial droop and slurred speech. Code stroke. Improved symptoms, patient was not a candidate for tPA. Initial encounter.  EXAM: MRI HEAD WITHOUT CONTRAST  MRA HEAD WITHOUT CONTRAST  TECHNIQUE: Multiplanar, multiecho pulse sequences of the brain and surrounding structures were obtained without intravenous contrast. Angiographic images of the head were obtained using MRA technique without contrast.  COMPARISON:  Cervical spine MRI 03/16/2013.  FINDINGS: MRI HEAD FINDINGS  Oval 14 mm area of restricted diffusion in the lateral left thalamus abutting the  posterior limb of the left internal capsule. T2 and FLAIR hyperintensity. No hemorrhage or mass effect. No other restricted diffusion identified. Major intracranial vascular flow voids are preserved. No midline shift, mass effect, evidence of mass lesion, ventriculomegaly, extra-axial collection or acute intracranial hemorrhage. Cervicomedullary junction and pituitary are within normal limits. Negative for age visualized cervical spine. Outside of the acute findings, normal for age gray and white matter signal throughout the brain. Visible internal auditory structures appear normal. Mastoids are clear. Minor paranasal sinus mucosal thickening. Postoperative changes to the globes. Visualized scalp soft tissues are within normal limits. Normal bone marrow signal.  MRA HEAD FINDINGS  Antegrade flow in the posterior circulation with codominant distal vertebral arteries. Normal vertebrobasilar junction. Normal right PICA origin. Left AICA may be dominant. Mildly tortuous basilar artery, no stenosis. SCA and PCA origins are within normal limits.  The left PCAs occluded in the P2 segment, with no distal left PCA flow (series 605, image 18). Posterior communicating arteries are diminutive or absent. Right PCA branches are within normal limits.  Antegrade flow in both ICA siphons. Mild tortuosity but no siphon stenosis identified. Ophthalmic artery origins are within normal limits. Carotid termini are patent. MCA and ACA origins are within normal limits. Anterior communicating artery is normal. Median artery of the corpus callosum is present. Visualized bilateral ACA branches are within normal limits. Visualized bilateral MCA branches are within normal limits.  IMPRESSION: 1. Acute lacunar infarct (14 mm) in the lateral left thalamus near the posterior limb of the left internal capsule. No mass effect or hemorrhage. 2. The left PCA is occluded in the P2 segment, near the expected location of the left thalamostriate artery  origins. 3. Otherwise negative intracranial MRA, and otherwise normal for age non contrast MRI appearance of the brain.   Electronically Signed   By: Augusto Gamble M.D.   On: 07/19/2014 12:22         Subjective: Patient is feeling well. There is no further dysarthria. Her right arm and right leg will feel stronger but not back to baseline. She is wondering when physical therapy is to come around.denies fevers, chills, chest discomfort, shortness breath, nausea, vomiting, abdominal pain, diarrhea     Objective: Filed Vitals:   07/18/14 1836 07/18/14 2154 07/19/14 0234 07/19/14 0641  BP: 128/56 122/59 127/61 131/61  Pulse: 68 68 64 61  Temp: 98.9 F (37.2 C) 98.1 F (36.7 C) 98.4 F (36.9 C) 97.7 F (36.5 C)  TempSrc: Oral Oral Oral Oral  Resp: 18 18 16 18   Height:      Weight:      SpO2: 96% 95% 99% 97%   No intake or output data in the 24 hours ending 07/19/14 1304 Weight change:  Exam:   General:  Pt is alert, follows commands appropriately, not in acute distress  HEENT: No icterus, No thrush, [place]/AT  Cardiovascular: RRR, S1/S2, no rubs, no gallops  Respiratory: CTA bilaterally, no wheezing, no crackles, no rhonchi  Abdomen: Soft/+BS, non tender, non distended, no guarding  Extremities: No edema, No lymphangitis, No petechiae, No rashes, no synovitis  Data Reviewed: Basic Metabolic Panel:  Recent Labs Lab 07/19/14 0545  NA 140  K 3.8  CL 104  CO2 25  GLUCOSE 86  BUN 14  CREATININE 0.72  CALCIUM 8.6   Liver Function Tests: No results found for this basename: AST, ALT, ALKPHOS, BILITOT, PROT, ALBUMIN,  in the last 168 hours No results found for this basename: LIPASE, AMYLASE,  in the last 168 hours No results found for this basename: AMMONIA,  in the last 168 hours CBC:  Recent Labs Lab 07/18/14 0422 07/19/14 0545  WBC 6.0 4.1  HGB 13.9 14.0  HCT 41.7 41.8  MCV 90.3 90.3  PLT 104* 98*   Cardiac Enzymes: No results found for this basename: CKTOTAL,  CKMB, CKMBINDEX, TROPONINI,  in the last 168 hours BNP: No components found with this basename: POCBNP,  CBG:  Recent Labs Lab 07/18/14 1212 07/18/14 1710 07/18/14 1955 07/19/14 0642 07/19/14 1127  GLUCAP 99 105* 96 90 106*    No results found for this or any previous visit (from the past 240 hour(s)).   Scheduled Meds: . ALPRAZolam  0.25 mg Oral BID  . aspirin EC  325 mg Oral Daily  . atorvastatin  20 mg Oral q1800  . docusate sodium  100 mg Oral BID  . gabapentin  300 mg Oral QHS  . insulin aspart  0-5 Units Subcutaneous QHS  . insulin aspart  0-9 Units Subcutaneous TID WC  . primidone  250 mg Oral BID WC  . traMADol  50 mg Oral QHS   Continuous Infusions:    Azlan Hanway, DO  [facility] Pager [phone]  If 7PM-7AM, please contact night-coverage [email or web address] Password [email or web address] 07/19/2014, 1:04 PM   LOS: 1 day

## 2014-07-20 ENCOUNTER — Inpatient Hospital Stay (HOSPITAL_COMMUNITY): Payer: Medicare Other

## 2014-07-20 DIAGNOSIS — M79605 Pain in left leg: Secondary | ICD-10-CM | POA: Diagnosis not present

## 2014-07-20 DIAGNOSIS — M79609 Pain in unspecified limb: Secondary | ICD-10-CM

## 2014-07-20 DIAGNOSIS — I6330 Cerebral infarction due to thrombosis of unspecified cerebral artery: Secondary | ICD-10-CM

## 2014-07-20 LAB — GLUCOSE, CAPILLARY
GLUCOSE-CAPILLARY: 93 mg/dL (ref 70–99)
GLUCOSE-CAPILLARY: 106 mg/dL — AB (ref 70–99)
GLUCOSE-CAPILLARY: 129 mg/dL — AB (ref 70–99)
Glucose-Capillary: 130 mg/dL — ABNORMAL HIGH (ref 70–99)

## 2014-07-20 MED ORDER — POLYETHYLENE GLYCOL 3350 17 G PO PACK
17.0000 g | PACK | Freq: Two times a day (BID) | ORAL | Status: DC
  Administered 2014-07-20 – 2014-07-21 (×3): 17 g via ORAL
  Filled 2014-07-20 (×3): qty 1

## 2014-07-20 MED ORDER — ASPIRIN EC 81 MG PO TBEC
81.0000 mg | DELAYED_RELEASE_TABLET | Freq: Every day | ORAL | Status: DC
  Administered 2014-07-21: 81 mg via ORAL
  Filled 2014-07-20: qty 1

## 2014-07-20 MED ORDER — ASPIRIN-DIPYRIDAMOLE ER 25-200 MG PO CP12
1.0000 | ORAL_CAPSULE | Freq: Every day | ORAL | Status: DC
  Administered 2014-07-20: 1 via ORAL
  Filled 2014-07-20: qty 1

## 2014-07-20 MED ORDER — IBUPROFEN 400 MG PO TABS
400.0000 mg | ORAL_TABLET | Freq: Four times a day (QID) | ORAL | Status: DC | PRN
  Administered 2014-07-21: 400 mg via ORAL
  Filled 2014-07-20 (×2): qty 1

## 2014-07-20 MED ORDER — ACETAMINOPHEN 325 MG PO TABS
650.0000 mg | ORAL_TABLET | Freq: Every day | ORAL | Status: DC
  Administered 2014-07-20: 650 mg via ORAL
  Filled 2014-07-20: qty 2

## 2014-07-20 MED ORDER — ASPIRIN-DIPYRIDAMOLE ER 25-200 MG PO CP12: 1.0000 | ORAL_CAPSULE | Freq: Two times a day (BID) | ORAL | Status: DC

## 2014-07-20 NOTE — Progress Notes (Signed)
*  PRELIMINARY RESULTS* Vascular Ultrasound Lower extremity venous duplex has been completed.  Preliminary findings: no evidence of DVT  Farrel Demark, RDMS, RVT  07/20/2014, 4:52 PM

## 2014-07-20 NOTE — Evaluation (Signed)
Speech Language Pathology Evaluation Patient Details Name: [name] [name] MRN: [id number] DOB: [birth year] Today's Date: 07/20/2014 Time: 0940-1000 SLP Time Calculation (min): 20 min  Problem List:  Patient Active Problem List   Diagnosis Date Noted  . Left leg pain 07/20/2014  . Stroke 07/19/2014  . HTN (hypertension) 07/18/2014  . HLD (hyperlipidemia) 07/18/2014  . Acute ischemic stroke 07/18/2014  . Thrombocytopenia, unspecified 07/18/2014  . Essential tremor 10/27/2013  . Obesity, unspecified 10/27/2013  . DM2 (diabetes mellitus, type 2) 10/27/2013  . Unspecified hereditary and idiopathic peripheral neuropathy 10/27/2013   Past Medical History:  Past Medical History  Diagnosis Date  . Hypertension   . High cholesterol   . DVT (deep venous thrombosis)   . Pneumonia   . Anxiety   . Essential tremor 10/27/2013  . Obesity, unspecified 10/27/2013  . Newly diagnosed diabetes 10/27/2013  . Unspecified hereditary and idiopathic peripheral neuropathy 10/27/2013   Past Surgical History:  Past Surgical History  Procedure Laterality Date  . Foot surgery    . Cataract extraction Bilateral    HPI:  Ms. [name] [name] is a 80 y.o. female transferred to [facility] from [facility] where she presented with right hemiparesis. She did not receive IV t-PA there due to low platelets. Imaging confirms a left lateral thalamus/PLIC infarct etiology likely small vessel disease.    Assessment / Plan / Recommendation Clinical Impression  Pt evaluated with phone line interpreter and then with son present. Pt able to utilize Farsi accurately and fluently without noticable error per son and interpreter. Pt able to name items and participate in conversation without error (though she does struggle with words in English due to limited capacity in non native language). No dysarthria apparent. Son reports that she is 99% recovered from 1-2 days of language impairment. Suspect pt may have some  residual cognitive impairment that could impact level of independence at home (medications etc). Recommend pt f/u with SLP at next level of care for in depth evalaution of functional capacity to determine need for services at that venue.     SLP Assessment  All further Speech Lanaguage Pathology  needs can be addressed in the next venue of care    Follow Up Recommendations    CIR   Frequency and Duration        Pertinent Vitals/Pain Pain Assessment: No/denies pain   SLP Goals     SLP Evaluation Prior Functioning  Cognitive/Linguistic Baseline: Baseline deficits Baseline deficit details: mild memory deficits related to peoples names Type of Home: House  Lives With: Family Available Help at Discharge: Available PRN/intermittently   Cognition  Overall Cognitive Status: Within Functional Limits for tasks assessed    Comprehension  Auditory Comprehension Overall Auditory Comprehension: Appears within functional limits for tasks assessed    Expression Verbal Expression Overall Verbal Expression: Appears within functional limits for tasks assessed   Oral / Motor Oral Motor/Sensory Function Overall Oral Motor/Sensory Function: Appears within functional limits for tasks assessed Motor Speech Overall Motor Speech: Appears within functional limits for tasks assessed   GO    Harlon Ditty, MA CCC-SLP [phone]  Claudine Mouton 07/20/2014, 11:28 AM

## 2014-07-20 NOTE — Progress Notes (Signed)
STROKE TEAM PROGRESS NOTE   HISTORY [name] [name] is a 80 y.o. female who was in [place], [place] visiting some family when she developed sudden onset right-sided weakness. She presented to [facility]. They planned to give her IV TPA, however her platelets were 90,000 and therefore she did not receive IV TPA. She did have some improvement with NIH improving from 7 to 3. At family's request, she was transferred to [facility]. Of note, the patient only speaks a little English and translation was done predominantly by the son.    SUBJECTIVE (INTERVAL HISTORY) Husband at bedside. Daughter is a MD and son in law in a Careers adviser. When she was admitted to [facility], they recommended Aggrenox. Husband wants to know if that is better for her.   OBJECTIVE Temp:  [97.6 F (36.4 C)-98.5 F (36.9 C)] 97.7 F (36.5 C) (08/26 1008) Pulse Rate:  [62-81] 81 (08/26 1008) Cardiac Rhythm:  [-] Normal sinus rhythm;Heart block (08/25 2000) Resp:  [16-18] 18 (08/26 1008) BP: (112-130)/(36-63) 125/47 mmHg (08/26 1008) SpO2:  [96 %-99 %] 97 % (08/26 1008)   Recent Labs Lab 07/19/14 1127 07/19/14 1638 07/19/14 2244 07/20/14 0643 07/20/14 1134  GLUCAP 106* 90 86 93 106*    Recent Labs Lab 07/19/14 0545  NA 140  K 3.8  CL 104  CO2 25  GLUCOSE 86  BUN 14  CREATININE 0.72  CALCIUM 8.6    Recent Labs Lab 07/18/14 0422 07/19/14 0545  WBC 6.0 4.1  HGB 13.9 14.0  HCT 41.7 41.8  MCV 90.3 90.3  PLT 104* 98*       Component Value Date/Time   CHOL 153 07/18/2014 0422   TRIG 78 07/18/2014 0422   HDL 75 07/18/2014 0422   CHOLHDL 2.0 07/18/2014 0422   VLDL 16 07/18/2014 0422   LDLCALC 62 07/18/2014 0422   Lab Results  Component Value Date   HGBA1C 6.0* 07/18/2014     Dg Chest 2 View  07/18/2014   CLINICAL DATA:  Dyspnea.  EXAM: CHEST  2 VIEW  COMPARISON:  March 17, 2013.  FINDINGS: Stable cardiomediastinal silhouette. No pneumothorax or pleural effusion is noted. Degenerative disc  disease is noted in the lower thoracic spine. No acute pulmonary disease is noted.  IMPRESSION: No acute cardiopulmonary abnormality seen.   Electronically Signed   By: Roque Lias M.D.   On: 07/18/2014 14:10   Mr Brain Wo Contrast  07/19/2014   ADDENDUM REPORT: 07/19/2014 13:19  ADDENDUM: Study discussed by telephone with Dr. Onalee Hua Tat on 07/19/2014 at 1254 hrs.   Electronically Signed   By: Augusto Gamble M.D.   On: 07/19/2014 13:19   07/19/2014   CLINICAL DATA:  80 year old female with acute onset right side weakness facial droop and slurred speech. Code stroke. Improved symptoms, patient was not a candidate for tPA. Initial encounter.  EXAM: MRI HEAD WITHOUT CONTRAST  MRA HEAD WITHOUT CONTRAST  TECHNIQUE: Multiplanar, multiecho pulse sequences of the brain and surrounding structures were obtained without intravenous contrast. Angiographic images of the head were obtained using MRA technique without contrast.  COMPARISON:  Cervical spine MRI 03/16/2013.  FINDINGS: MRI HEAD FINDINGS  Oval 14 mm area of restricted diffusion in the lateral left thalamus abutting the posterior limb of the left internal capsule. T2 and FLAIR hyperintensity. No hemorrhage or mass effect. No other restricted diffusion identified. Major intracranial vascular flow voids are preserved. No midline shift, mass effect, evidence of mass lesion, ventriculomegaly, extra-axial collection or acute intracranial  hemorrhage. Cervicomedullary junction and pituitary are within normal limits. Negative for age visualized cervical spine. Outside of the acute findings, normal for age gray and white matter signal throughout the brain. Visible internal auditory structures appear normal. Mastoids are clear. Minor paranasal sinus mucosal thickening. Postoperative changes to the globes. Visualized scalp soft tissues are within normal limits. Normal bone marrow signal.  MRA HEAD FINDINGS  Antegrade flow in the posterior circulation with codominant distal  vertebral arteries. Normal vertebrobasilar junction. Normal right PICA origin. Left AICA may be dominant. Mildly tortuous basilar artery, no stenosis. SCA and PCA origins are within normal limits.  The left PCAs occluded in the P2 segment, with no distal left PCA flow (series 605, image 18). Posterior communicating arteries are diminutive or absent. Right PCA branches are within normal limits.  Antegrade flow in both ICA siphons. Mild tortuosity but no siphon stenosis identified. Ophthalmic artery origins are within normal limits. Carotid termini are patent. MCA and ACA origins are within normal limits. Anterior communicating artery is normal. Median artery of the corpus callosum is present. Visualized bilateral ACA branches are within normal limits. Visualized bilateral MCA branches are within normal limits.  IMPRESSION: 1. Acute lacunar infarct (14 mm) in the lateral left thalamus near the posterior limb of the left internal capsule. No mass effect or hemorrhage. 2. The left PCA is occluded in the P2 segment, near the expected location of the left thalamostriate artery origins. 3. Otherwise negative intracranial MRA, and otherwise normal for age non contrast MRI appearance of the brain.  Electronically Signed: By: Augusto Gamble M.D. On: 07/19/2014 12:22   Mr Maxine Glenn Head/brain Wo Cm  07/19/2014   ADDENDUM REPORT: 07/19/2014 13:19  ADDENDUM: Study discussed by telephone with Dr. Onalee Hua Tat on 07/19/2014 at 1254 hrs.   Electronically Signed   By: Augusto Gamble M.D.   On: 07/19/2014 13:19   07/19/2014   CLINICAL DATA:  80 year old female with acute onset right side weakness facial droop and slurred speech. Code stroke. Improved symptoms, patient was not a candidate for tPA. Initial encounter.  EXAM: MRI HEAD WITHOUT CONTRAST  MRA HEAD WITHOUT CONTRAST  TECHNIQUE: Multiplanar, multiecho pulse sequences of the brain and surrounding structures were obtained without intravenous contrast. Angiographic images of the head were  obtained using MRA technique without contrast.  COMPARISON:  Cervical spine MRI 03/16/2013.  FINDINGS: MRI HEAD FINDINGS  Oval 14 mm area of restricted diffusion in the lateral left thalamus abutting the posterior limb of the left internal capsule. T2 and FLAIR hyperintensity. No hemorrhage or mass effect. No other restricted diffusion identified. Major intracranial vascular flow voids are preserved. No midline shift, mass effect, evidence of mass lesion, ventriculomegaly, extra-axial collection or acute intracranial hemorrhage. Cervicomedullary junction and pituitary are within normal limits. Negative for age visualized cervical spine. Outside of the acute findings, normal for age gray and white matter signal throughout the brain. Visible internal auditory structures appear normal. Mastoids are clear. Minor paranasal sinus mucosal thickening. Postoperative changes to the globes. Visualized scalp soft tissues are within normal limits. Normal bone marrow signal.  MRA HEAD FINDINGS  Antegrade flow in the posterior circulation with codominant distal vertebral arteries. Normal vertebrobasilar junction. Normal right PICA origin. Left AICA may be dominant. Mildly tortuous basilar artery, no stenosis. SCA and PCA origins are within normal limits.  The left PCAs occluded in the P2 segment, with no distal left PCA flow (series 605, image 18). Posterior communicating arteries are diminutive or absent. Right PCA branches are  within normal limits.  Antegrade flow in both ICA siphons. Mild tortuosity but no siphon stenosis identified. Ophthalmic artery origins are within normal limits. Carotid termini are patent. MCA and ACA origins are within normal limits. Anterior communicating artery is normal. Median artery of the corpus callosum is present. Visualized bilateral ACA branches are within normal limits. Visualized bilateral MCA branches are within normal limits.  IMPRESSION: 1. Acute lacunar infarct (14 mm) in the lateral left  thalamus near the posterior limb of the left internal capsule. No mass effect or hemorrhage. 2. The left PCA is occluded in the P2 segment, near the expected location of the left thalamostriate artery origins. 3. Otherwise negative intracranial MRA, and otherwise normal for age non contrast MRI appearance of the brain.  Electronically Signed: By: Augusto Gamble M.D. On: 07/19/2014 12:22    PHYSICAL EXAM Elderly [place] origin lady mildly orthopnoic in bed. Has mild tremor and asterixis right greater than left hand.Awake alert. Afebrile. Head is nontraumatic. Neck is supple without bruit. Hearing is normal. Cardiac exam no murmur or gallop. Lungs are clear to auscultation. Distal pulses are well felt. Neurological Exam : Awake alert oriented x3. Slightly diminished attention registration and recall. Extraocular movements are full range without nystagmus. Speech is normal without dysarthria or aphasia. Fundi were not visualized. Vision acuity and fields appear normal. Face is symmetric without weakness. Tongue is midline. Motor system exam revealed no drift but mild weakness of the right grip and intrinsic hand muscles and right hip flexors and ankle dorsiflexors 4/5. Orbits left-to-right upper extremity. Diminished touch pinprick sensation on the right hemibody and lower face. Position sense and vibration sense preserved. Slightly impaired finger-to-nose coordination on the right. Gait was not tested.   ASSESSMENT/PLAN Ms. CHAYNA SURRATT is a 80 y.o. female transferred to [facility] from [facility] where she presented with right hemiparesis. She did not receive IV t-PA there due to low platelets. Imaging confirms a left lateral thalamus/PLIC infarct etiology likely small vessel disease. Stroke work up completed.  Stroke:  left lateral thalamus/PLIC infarct   aspirin 81 mg orally every day prior to admission, now on aspirin 325 mg orally every day. After discussion with husband, will change aspirin to   dipyridamole SR 250 mg/aspirin 25 mg orally twice a day for secondary stroke prevention. To prevent headache, most common side effect of Aggrenox, will start Aggrenox q hs x 2 weeks then increase Aggrenox to bid.  Until then, aspirin 81 mg q am x 2 weeks, then discontinue. May take Tylenol 650 mg 1 hr prior to Aggrenox for the first week, then discontinue.   MRI left lateral thalamus/PLIC infarct  MRA left PCA occlusion P2 segment  2D Echo  No source of embolus   Carotid no significant stensosis  SCDs for VTE prophylaxis    heart healthy/carb modified liquids.   OOB with assistance  Therapy needs:  CIR  Risk factor management/education  Disposition:  CIR today  Hypertension   Home meds:  Tenormin, cardizem, lasix. Not resumed in hospital  Stable off meds  Hyperlipidemia  LDL 62   Patient on lipitor 20 at home, resumed in hospital  At LDL goal <70 for diabetics  Diabetes  HgbA1c 6.0   Controlled  At Goal < 7.0  Other Stroke Risk Factors Advanced age   Obesity, Body mass index is 32.9 kg/(m^2).   Other Active Problems  Dyspnea, cxr neg, ekg ok  Left leg pain. Seems muscular. No swelling. LE dopplers pending.  Other Pertinent History  Thrombocytopenia, chronic dating back to 2009. Stable at 98k  No further stroke workup indicated.  Patient has a 10-15% risk of having another stroke over the next year, the highest risk is within 2 weeks of the most recent stroke/TIA (risk of having a stroke following a stroke or TIA is the same).  Ongoing risk factor control by Primary Care Physician  Stroke Service will sign off. Please call should any needs arise.  Follow up with Dr. Frances Furbish, appt already scheduled in September  Hospital day # 2  Annie Main, MSN, RN, ANVP-BC, ANP-BC, [facility] Pager: [phone] 07/20/2014 11:44 AM  I have personally examined this patient, reviewed notes, independently viewed imaging studies, participated  in medical decision making and plan of care. I have made any additions or clarifications directly to the above note. Agree with note above.   Delia Heady, MD Medical Director [facility] Pager: [phone] 07/22/2014 11:37 PM   To contact Stroke Continuity provider, please refer to [email or web address]. After hours, contact General Neurology

## 2014-07-20 NOTE — PMR Pre-admission (Signed)
PMR Admission Coordinator Pre-Admission Assessment  Patient: JAMIN HUMPHRIES is an 80 y.o., female MRN: [id number] DOB: 06-Aug-1930 Height:  (144.8 cm) Weight: 68.992 kg (152 lb 1.6 oz)              Insurance Information HMO:     PPO:      PCP:      IPA:      80/20:      OTHER:  PRIMARY:  Medicare A and B      Policy#: [id number]      Subscriber:  self Benefits:  Phone #:      Name:  Eff. Date:  Part A 11/25/00; Part B 07/26/00     Deduct:  $1260      Out of Pocket Max:  no      Life Max:  no CIR:  100%      SNF:  100% firs 20 days Outpatient:  80%     Co-Pay:  20% Home Health:  100% per Medicare      Co-Pay:  DME:  80%      Co-Pay:  20% Providers:  Pt. choice SECONDARY:  Medicaid [place] Access      Policy#:  [id number]      Subscriber:  self   Emergency Contact Information Contact Information   Name Relation Home Work Mobile   [place] Daughter [phone]     Blake Divine [phone]      Current Medical History  Patient Admitting Diagnosis: left thalamus, PLIC infarct  History of Present Illness: ROYALE LENNARTZ is a 80 y.o. right-handed female limited English speaking who was visiting family in [place] and she experienced acute onset right-sided weakness with history of thrombocytopenia, hypertension and newly diagnosed diabetes mellitus. Patient independent prior to admission. She was transferred to [facility] at family's request.   MRI of the brain showed acute lacunar infarct in the lateral left thalamus near the posterior limb of the left internal capsule. MRA of the head showed left PCA to be occluded. Echocardiogram with ejection fraction of 65% grade 1 diastolic dysfunction. Carotid Dopplers with no ICA stenosis. Patient did not receive TPA due to low platelets.  Neurology services consulted placed on aspirin and Aggrenox for CVA prophylaxis and discontinue aspirin once Aggrenox increased to twice a day after 14  days. Complaints of left leg pain denying any recent trauma with x-ray series of left lower extremity negative for fracture. BLE dopplers negative also.  Tolerating a regular consistency diet.  Aspirin 81 mg orally every day prior to admission, now on aspirin 325 mg orally every day. After discussion with husband, will change aspirin to dipyridamole SR 250 mg/aspirin 25 mg orally twice a day for secondary stroke prevention. To prevent headache, most common side effect of Aggrenox, will start Aggrenox q hs x 2 weeks then increase Aggrenox to bid. Until then, aspirin 81 mg q am x 2 weeks, then discontinue. May take Tylenol 650 mg 1 hr prior to Aggrenox for the first week, then discontinue.    Total: 0 NIH    Past Medical History  Past Medical History  Diagnosis Date  . Hypertension   . High cholesterol   . DVT (deep venous thrombosis)   . Pneumonia   . Anxiety   . Essential tremor 10/27/2013  . Obesity, unspecified 10/27/2013  . Newly diagnosed diabetes 10/27/2013  . Unspecified hereditary and idiopathic peripheral neuropathy 10/27/2013    Family History  family history includes Coronary  artery disease in her other.  Prior Rehab/Hospitalizations: no IP Rehab history; OP PT for foot fracture in the past  Current Medications  Current facility-administered medications:acetaminophen (TYLENOL) tablet 650 mg, 650 mg, Oral, QHS, Layne Benton, NP, 650 mg at 07/20/14 2206;  ALPRAZolam Prudy Feeler) tablet 0.25 mg, 0.25 mg, Oral, BID, Jared M Gardner, DO, 0.25 mg at 07/21/14 1034;  aspirin EC tablet 81 mg, 81 mg, Oral, Daily, Layne Benton, NP, 81 mg at 07/21/14 1034;  atorvastatin (LIPITOR) tablet 20 mg, 20 mg, Oral, q1800, Hillary Bow, DO, 20 mg at 07/20/14 1735 dipyridamole-aspirin (AGGRENOX) 200-25 MG per 12 hr capsule 1 capsule, 1 capsule, Oral, QHS, Layne Benton, NP, 1 capsule at 07/20/14 2206;  [START ON 08/03/2014] dipyridamole-aspirin (AGGRENOX) 200-25 MG per 12 hr capsule 1 capsule, 1 capsule,  Oral, BID, Layne Benton, NP;  docusate sodium (COLACE) capsule 100 mg, 100 mg, Oral, BID, Jared M Gardner, DO, 100 mg at 07/21/14 1034 gabapentin (NEURONTIN) capsule 300 mg, 300 mg, Oral, QHS, Jared M Gardner, DO, 300 mg at 07/20/14 2206;  ibuprofen (ADVIL,MOTRIN) tablet 400 mg, 400 mg, Oral, Q6H PRN, Ramiro Harvest V, MD, 400 mg at 07/21/14 1034;  insulin aspart (novoLOG) injection 0-5 Units, 0-5 Units, Subcutaneous, QHS, David Tat, MD;  insulin aspart (novoLOG) injection 0-9 Units, 0-9 Units, Subcutaneous, TID WC, Catarina Hartshorn, MD, 1 Units at 07/20/14 1735 polyethylene glycol (MIRALAX / GLYCOLAX) packet 17 g, 17 g, Oral, BID, Rodolph Bong, MD, 17 g at 07/21/14 1034;  primidone (MYSOLINE) tablet 250 mg, 250 mg, Oral, BID WC, Hillary Bow, DO, 250 mg at 07/21/14 1308;  traMADol (ULTRAM) tablet 50 mg, 50 mg, Oral, QHS, Jared M Gardner, DO, 50 mg at 07/20/14 2205  Patients Current Diet: heart healthy/carb modified  Precautions / Restrictions Precautions Precautions: Fall Precaution Comments: pt very unsteady on her feet.  Restrictions Weight Bearing Restrictions: No   Prior Activity Level Community (5-7x/wk): Pt. goes out for dinner, out to the mall and [employer] with her son 3-4 times per week according to the son.  Home Assistive Devices / Equipment Home Assistive Devices/Equipment: None  Prior Functional Level Prior Function Level of Independence: Independent Comments: son reports pt recently traveled > 15 hours in airplane traveling `  Current Functional Level Cognition  Overall Cognitive Status: Within Functional Limits for tasks assessed Orientation Level: Oriented X4    Extremity Assessment (includes Sensation/Coordination)          ADLs  Overall ADL's : Needs assistance/impaired Grooming: Min guard;Wash/dry face;Sitting Lower Body Bathing: Maximal assistance;Sit to/from stand Toilet Transfer: Moderate assistance Toileting- Clothing Manipulation and Hygiene: Maximal  assistance Functional mobility during ADLs: +2 for physical assistance;Moderate assistance General ADL Comments: Pt required (A) to ambulate ~8-10 ft with hand held (A). pt demonstrates Lt LE deficits c/o pain. Pt limp and narrowed base of support. pt with scissored gait    Mobility  Overal bed mobility: Needs Assistance Bed Mobility: Supine to Sit Supine to sit: Min assist General bed mobility comments: Min assist to support trunk to get to sitting EOB.  Pt relying heavily on upper extremities to support her on the bed rail as she moves to sitting EOB.     Transfers  Overall transfer level: Needs assistance Equipment used: Rolling walker (2 wheeled) Transfers: Sit to/from Stand Sit to Stand: Min assist General transfer comment: Min assist from elevated bed and toilet.  Pt is so short that she is practically standing once she gets her feet  on the ground.  Pt relying, again, heavily on her arms for transitions to sit and stand.     Ambulation / Gait / Stairs / Wheelchair Mobility  Ambulation/Gait Ambulation/Gait assistance: Mod assist Ambulation Distance (Feet): 20 Feet Assistive device: Rolling walker (2 wheeled) Gait Pattern/deviations: Step-through pattern;Antalgic Gait velocity: decreased Gait velocity interpretation: Below normal speed for age/gender General Gait Details: Pt with very antalgic gait pattern favoring her left leg today.  She has improved gait pattern with increased gait distance (as if she is working out some stiffness), but the antalgic pattern never really goes away.  Pt needs max verbal, and tactile cues on how to safely use RW.  Mod assist to support trunk and help steer RW during gait.     Posture / Balance      Special needs/care consideration   Bowel mgmt: continent Bladder mgmt: continent with use of BSC Diabetic mgmt Hgb A1 C 6 DM type 2   Previous Home Environment Living Arrangements: Children;Other relatives  Lives With: Family Available Help at  Discharge: Available 24 hours/day;Other (Comment) (pt's daughter to come from [place] next week) Type of Home: House Home Layout: One level Home Access: Stairs to enter Entrance Stairs-Rails: None Entrance Stairs-Number of Steps: 2 Bathroom Shower/Tub: Engineer, manufacturing systems: Standard Bathroom Accessibility: Yes How Accessible: Accessible via walker Home Care Services: No Additional Comments: Pt does not speak English well.  No family present to confirm living situation.   Discharge Living Setting Plans for Discharge Living Setting: House;Lives with (comment) (son) Type of Home at Discharge: House Discharge Home Layout: One level Discharge Home Access: Stairs to enter Entrance Stairs-Rails: None Entrance Stairs-Number of Steps: 2 Discharge Bathroom Shower/Tub: Tub/shower unit Discharge Bathroom Toilet: Standard Discharge Bathroom Accessibility: Yes How Accessible: Accessible via walker Does the patient have any problems obtaining your medications?: No  Social/Family/Support Systems Patient Roles: Partner Anticipated Caregiver: son, Odis Hollingshead (teaches intermittently at [employer]); daughter who is coming from [place] next week to stay with pt. as long as need, Mandana Zargham Anticipated Caregiver's Contact Information: Odis Hollingshead [phone] Ability/Limitations of Caregiver: son teaches classes at [employer] intermittently, daughter wil be available 24 hours when she arrives next week from [place] Caregiver Availability: 24/7 Discharge Plan Discussed with Primary Caregiver: Yes Is Caregiver In Agreement with Plan?: Yes Does Caregiver/Family have Issues with Lodging/Transportation while Pt is in Rehab?: No  Goals/Additional Needs Patient/Family Goal for Rehab: mod (I) for PT and OT Expected length of stay: 10-12 days Cultural Considerations: Muslim, does not eat pork Equipment Needs: TBD Special Service Needs: Pt. speaks little [place], needs interpretive services;  speaks [place] [name] .   Pt/Family Agrees to Admission and willing to participate: Yes Program Orientation Provided & Reviewed with Pt/Caregiver Including Roles  & Responsibilities: Yes  Decrease burden of Care through IP rehab admission: no   Possible need for SNF placement upon discharge:  Not anticipated  Patient Condition: This patient's medical and functional status has changed since the consult dated: 07/19/2014 in which the Rehabilitation Physician determined and documented that the patient's condition is appropriate for intensive rehabilitative care in an inpatient rehabilitation facility. See "History of Present Illness" (above) for medical update. Functional changes are: overall min to mod assist. Patient's medical and functional status update has been discussed with the Rehabilitation physician and patient remains appropriate for inpatient rehabilitation. Will admit to inpatient rehab today.  Preadmission Screen Completed By:  [name], 07/21/2014 11:28 AM ______________________________________________________________________   Discussed status with Dr. Wynn Banker on  07/21/2014 at  1128 and received telephone approval for admission today.  Admission Coordinator:  [name], time 1610 Date 07/21/2014

## 2014-07-20 NOTE — Progress Notes (Signed)
Physical Therapy Treatment Patient Details Name: [name] [name] MRN: [id number] DOB: [birth year] Today's Date: 07/20/2014    History of Present Illness 80 y.o. female admitted to [facility] on 07/18/14 with R sided weakness, facial droop.  She was transported from [facility] to [facility] per pt and family request.  Stroke workup in progress.  MRI revealed, "Acute lacunar infarct (14 mm) in the lateral left thalamus near the posterior limb of the left internal capsule.The left PCA is occluded."  Pt with significant PMHx of HTN, DVT, anxiety, essential tremor, DM, peripheral neuropathy, and foot surgery.     PT Comments    Pt is progressing well with her mobility.  She does better with bil upper extremities supported on the RW, but it is a new device and she needs quite a bit of cueing to use it safely.  More of an antalgic gait pattern today (left leg hurts), but it improved with increased gait distance.  I continue to recommend CIR level therapies for her at discharge. PT to continue to follow acutely.   Follow Up Recommendations  CIR     Equipment Recommendations  Rolling walker with 5" wheels    Recommendations for Other Services Rehab consult     Precautions / Restrictions Precautions Precautions: Fall Precaution Comments: pt very unsteady on her feet.     Mobility  Bed Mobility Overal bed mobility: Needs Assistance Bed Mobility: Supine to Sit     Supine to sit: Min assist     General bed mobility comments: Min assist to support trunk to get to sitting EOB.  Pt relying heavily on upper extremities to support her on the bed rail as she moves to sitting EOB.   Transfers Overall transfer level: Needs assistance Equipment used: Rolling walker (2 wheeled) Transfers: Sit to/from Stand Sit to Stand: Min assist         General transfer comment: Min assist from elevated bed and toilet.  Pt is so short that she is practically standing once she gets her feet on the ground.  Pt relying,  again, heavily on her arms for transitions to sit and stand.   Ambulation/Gait Ambulation/Gait assistance: Mod assist Ambulation Distance (Feet): 20 Feet Assistive device: Rolling walker (2 wheeled) Gait Pattern/deviations: Step-through pattern;Antalgic Gait velocity: decreased Gait velocity interpretation: Below normal speed for age/gender General Gait Details: Pt with very antalgic gait pattern favoring her left leg today.  She has improved gait pattern with increased gait distance (as if she is working out some stiffness), but the antalgic pattern never really goes away.  Pt needs max verbal, and tactile cues on how to safely use RW.  Mod assist to support trunk and help steer RW during gait.           Balance Overall balance assessment: Needs assistance Sitting-balance support: Feet supported;Bilateral upper extremity supported Sitting balance-Leahy Scale: Fair     Standing balance support: Bilateral upper extremity supported Standing balance-Leahy Scale: Poor                      Cognition Arousal/Alertness: Awake/alert Behavior During Therapy: WFL for tasks assessed/performed Overall Cognitive Status: Within Functional Limits for tasks assessed                             Pertinent Vitals/Pain Pain Assessment: Faces Faces Pain Scale: Hurts even more Pain Location: left leg Pain Descriptors / Indicators: Sharp Pain Intervention(s): Limited activity within  patient's tolerance;Monitored during session;Repositioned    Home Living Family/patient expects to be discharged to:: Inpatient rehab Living Arrangements: Children;Other relatives Available Help at Discharge: Available 24 hours/day;Other (Comment) (pt's daughter to come from [place] next week) Type of Home: House Home Access: Stairs to enter Entrance Stairs-Rails: None Home Layout: One level        Prior Function Level of Independence: Independent      Comments: son reports pt recently  traveled > 15 hours in airplane traveling `   PT Goals (current goals can now be found in the care plan section) Acute Rehab PT Goals Patient Stated Goal: none stated Progress towards PT goals: Progressing toward goals    Frequency  Min 4X/week    PT Plan Current plan remains appropriate       End of Session Equipment Utilized During Treatment: Gait belt Activity Tolerance: Patient limited by pain Patient left: in chair;with call bell/phone within reach     Time: 1610-9604 PT Time Calculation (min): 18 min  Charges:  $Gait Training: 8-22 mins                     [name] [name], PT, DPT [phone]   07/20/2014, 4:34 PM

## 2014-07-20 NOTE — Progress Notes (Signed)
Inpatient Rehabilitation  I met with patient and her son at the bedside to discuss her post acute rehab options.  I answered their questions and  provided informational booklets.  They indicated they would like for pt. to come to IP rehab for her rehab recovery.  I have discussed with Dr. Grandville Silos and Burnetta Sabin and await complete medical clearance.  If cleared medically, I will arrange to admit today.  Please call if questions.  [facility] Admissions Coordinator Cell [phone] Office [phone]

## 2014-07-20 NOTE — Progress Notes (Signed)
Inpatient Rehabilitation  Pt. has had her xrays of L LE but dopplers are still pending. My co-worker Ottie Glazier [phone]) will follow up with patient tomorrow in my absence. Discussed  Plan with pt's RN Paulette.   Please call if questions.    Weldon Picking PT Inpatient Rehab Admissions Coordinator Cell [phone] Office [phone]

## 2014-07-20 NOTE — Evaluation (Signed)
Occupational Therapy Evaluation Patient Details Name: [name] [name] MRN: [id number] DOB: Sep 10, 1930 Today's Date: 07/20/2014    History of Present Illness 80 y.o. female admitted to [facility] on 07/18/14 with R sided weakness, facial droop.  She was transported from [facility] to [facility] per pt and family request.  Stroke workup in progress.  MRI revealed, "Acute lacunar infarct (14 mm) in the lateral left thalamus near the posterior limb of the left internal capsule.The left PCA is occluded."  Pt with significant PMHx of HTN, DVT, anxiety, essential tremor, DM, peripheral neuropathy, and foot surgery.    Clinical Impression   PT admitted with Rt side weakness with MRI (+) Lt thalamus infarct. Pt currently with functional limitiations due to the deficits listed below (see OT problem list). PTA independent Pt will benefit from skilled OT to increase their independence and safety with adls and balance to allow discharge CIR.     Follow Up Recommendations  CIR    Equipment Recommendations  Other (comment) (defer)    Recommendations for Other Services Rehab consult     Precautions / Restrictions Precautions Precautions: Fall Restrictions Weight Bearing Restrictions: No      Mobility Bed Mobility Overal bed mobility: Needs Assistance Bed Mobility: Supine to Sit     Supine to sit: Mod assist     General bed mobility comments: Pt pulling at therapist and needed hadn over hand to redirect to bed rail. Pt needed (A) to scoot to eob. pt with difficulty shifting weight  Transfers Overall transfer level: Needs assistance Equipment used: 2 person hand held assist Transfers: Sit to/from Stand Sit to Stand: Mod assist         General transfer comment: pt required incr (A) with attempts to ambulate/ take steps. pt sit<>stand initiallly only mod (A)    Balance Overall balance assessment: Needs assistance Sitting-balance support: Bilateral upper extremity supported;Feet  supported Sitting balance-Leahy Scale: Fair     Standing balance support: Bilateral upper extremity supported;During functional activity Standing balance-Leahy Scale: Poor                              ADL Overall ADL's : Needs assistance/impaired     Grooming: Min guard;Wash/dry face;Sitting       Lower Body Bathing: Maximal assistance;Sit to/from stand           Toilet Transfer: Moderate assistance   Toileting- Clothing Manipulation and Hygiene: Maximal assistance       Functional mobility during ADLs: +2 for physical assistance;Moderate assistance General ADL Comments: Pt required (A) to ambulate ~8-10 ft with hand held (A). pt demonstrates Lt LE deficits c/o pain. Pt limp and narrowed base of support. pt with scissored gait     Vision                     Perception     Praxis      Pertinent Vitals/Pain Pain Assessment: Faces Faces Pain Scale: Hurts even more Pain Location: Lt leg Pain Intervention(s): Repositioned     Hand Dominance Right   Extremity/Trunk Assessment Upper Extremity Assessment Upper Extremity Assessment: Generalized weakness   Lower Extremity Assessment Lower Extremity Assessment: Defer to PT evaluation   Cervical / Trunk Assessment Cervical / Trunk Assessment: Normal   Communication Communication Communication: Prefers language other than English   Cognition Arousal/Alertness: Awake/alert Behavior During Therapy: [facility] for tasks assessed/performed  General Comments       Exercises       Shoulder Instructions      Home Living Family/patient expects to be discharged to:: Inpatient rehab Living Arrangements: Children;Other relatives Available Help at Discharge: Available PRN/intermittently Type of Home: House                              Lives With: Family    Prior Functioning/Environment Level of Independence: Independent        Comments: son reports pt  recently traveled > 15 hours in airplane traveling `    OT Diagnosis: Generalized weakness;Acute pain   OT Problem List: Decreased strength;Decreased activity tolerance;Impaired balance (sitting and/or standing);Decreased safety awareness;Decreased knowledge of use of DME or AE;Decreased knowledge of precautions;Pain   OT Treatment/Interventions: Self-care/ADL training;Therapeutic exercise;DME and/or AE instruction;Therapeutic activities;Patient/family education;Balance training    OT Goals(Current goals can be found in the care plan section) Acute Rehab OT Goals Patient Stated Goal: none stated OT Goal Formulation: With patient Time For Goal Achievement: 08/03/14 Potential to Achieve Goals: Good  OT Frequency: Min 2X/week   Barriers to D/C:            Co-evaluation              End of Session Equipment Utilized During Treatment: Gait belt Nurse Communication: Mobility status;Precautions  Activity Tolerance: Patient tolerated treatment well Patient left: in chair;with chair alarm set;with family/visitor present   Time: 1610-9604 OT Time Calculation (min): 12 min Charges:  OT General Charges $OT Visit: 1 Procedure OT Evaluation $Initial OT Evaluation Tier I: 1 Procedure OT Treatments $Self Care/Home Management : 8-22 mins G-Codes:    Harolyn Rutherford 07-Aug-2014, 3:32 PM Pager: [phone]

## 2014-07-20 NOTE — Consult Note (Signed)
Physical Medicine and Rehabilitation Consult Reason for Consult: CVA Referring Physician: [facility]   HPI: [name] [name] is a 80 y.o. right-handed female from [place] who was visiting family in the area with history of thrombocytopenia, hypertension and newly diagnosed diabetes mellitus. Patient independent prior to admission. Presented 07/18/2014 with acute onset of right-sided weakness. MRI of the brain showed acute lacunar infarct in the lateral left thalamus near the posterior limb of the left internal capsule. MRA of the head showed left PCA to be occluded. Echocardiogram with ejection fraction of 65% grade 1 diastolic dysfunction. Carotid Dopplers with no ICA stenosis. Patient did not receive TPA. Neurology services consulted placed on aspirin for CVA prophylaxis. Tolerating a regular consistency diet. Physical therapy evaluation completed a 25 2015 with recommendations of physical medicine rehabilitation consult.  Review of Systems  Gastrointestinal: Positive for constipation.  Neurological:       Essential tremor  Psychiatric/Behavioral:       Anxiety  All other systems reviewed and are negative.  Past Medical History  Diagnosis Date  . Hypertension   . High cholesterol   . DVT (deep venous thrombosis)   . Pneumonia   . Anxiety   . Essential tremor 10/27/2013  . Obesity, unspecified 10/27/2013  . Newly diagnosed diabetes 10/27/2013  . Unspecified hereditary and idiopathic peripheral neuropathy 10/27/2013   Past Surgical History  Procedure Laterality Date  . Foot surgery    . Cataract extraction Bilateral    Family History  Problem Relation Age of Onset  . Coronary artery disease Other    Social History:  reports that she has never smoked. She does not have any smokeless tobacco history on file. She reports that she does not drink alcohol or use illicit drugs. Allergies: No Known Allergies Medications Prior to Admission  Medication Sig Dispense Refill  .  ALPRAZolam (XANAX) 0.25 MG tablet Take 0.25 mg by mouth 2 (two) times daily.      [name] aspirin 81 MG tablet Take 81 mg by mouth daily.      [name] atenolol (TENORMIN) 50 MG tablet Take 25 mg by mouth daily.       [name] atorvastatin (LIPITOR) 20 MG tablet Take 20 mg by mouth daily.      [name] diltiazem (CARDIZEM) 30 MG tablet Take 15 mg by mouth every morning.       . docusate sodium (COLACE) 100 MG capsule Take 1 capsule (100 mg total) by mouth 2 (two) times daily.  10 capsule  0  . furosemide (LASIX) 20 MG tablet Take 20 mg by mouth daily.      [name] gabapentin (NEURONTIN) 100 MG capsule Take 100 mg by mouth 2 (two) times daily.      . metFORMIN (GLUCOPHAGE) 500 MG tablet Take 250 mg by mouth daily with breakfast.       . potassium chloride (K-DUR,KLOR-CON) 10 MEQ tablet Take 10 mEq by mouth daily.      . primidone (MYSOLINE) 250 MG tablet Take 1 tablet (250 mg total) by mouth 2 (two) times daily.  180 tablet  3  . [DISCONTINUED] gabapentin (NEURONTIN) 100 MG capsule Take 3 capsules (300 mg total) by mouth at bedtime.  270 capsule  3  . traMADol (ULTRAM) 50 MG tablet Take 50 mg by mouth at bedtime.      . [DISCONTINUED] sulfamethoxazole-trimethoprim (SEPTRA DS) 800-160 MG per tablet Take 1 tablet by mouth 2 (two) times daily.  6 tablet  0  Home: Home Living Family/patient expects to be discharged to:: Private residence Living Arrangements: Children;Other relatives Additional Comments: Pt does not speak English well.  No family present to confirm living situation.   Functional History: Prior Function Comments: No family available to report.  Functional Status:  Mobility: Bed Mobility Overal bed mobility: Needs Assistance Bed Mobility: Supine to Sit Supine to sit: Min assist General bed mobility comments: Min assist to support her trunk when transitioning to sitting.  Pt is using her bil arms to pull up against the bed rail on her bed for the transitio.  Transfers Overall transfer level: Needs  assistance Equipment used: 1 person hand held assist;2 person hand held assist Transfers: Sit to/from Stand Sit to Stand: Min assist General transfer comment: Min assist to stand from both the bed and the toilet.  Assist needed to help pt power up over her legs and for balance as pt has a tendancy for posterior LOB.  Ambulation/Gait Ambulation/Gait assistance: +2 physical assistance;Mod assist Ambulation Distance (Feet): 15 Feet Assistive device: 2 person hand held assist Gait Pattern/deviations: Decreased step length - right;Decreased stance time - right Gait velocity: decreased General Gait Details: Pt with buckling when WB on right leg during gait requiring two person mod assist for gait to the bathroom and back to her chair.  Pt reaching, when able, with bil upper extremities to support surfaces for balance and stability.     ADL:    Cognition: Cognition Overall Cognitive Status: No family/caregiver present to determine baseline cognitive functioning Orientation Level: Oriented X4;Other (comment) (Pt doesn't speak English; answered questions appropriately) Cognition Arousal/Alertness: Awake/alert Behavior During Therapy: WFL for tasks assessed/performed Overall Cognitive Status: No family/caregiver present to determine baseline cognitive functioning  Blood pressure 120/63, pulse 67, temperature 98.2 F (36.8 C), temperature source Oral, resp. rate 16, height  (1.448 m), weight 68.992 kg (152 lb 1.6 oz), SpO2 99.00%. Physical Exam  Constitutional: She appears well-developed.  HENT:  Head: Normocephalic.  Eyes: EOM are normal.  Neck: Normal range of motion. Neck supple. No thyromegaly present.  Cardiovascular: Normal rate and regular rhythm.   Respiratory: Effort normal and breath sounds normal. No respiratory distress.  GI: Soft. Bowel sounds are normal. She exhibits no distension.  Neurological: She is alert.  Patient limited English speaking. She does make good eye  contact with examiner. She was able to communicate her name, age and follows simple commands. Mild right upper extremity weakness---4/5 prox to distal. RLE: HF 2/5, KE 3/5. ADF/APF 3+/5. Subtle difference in LT/PP on right (vs left). Seems to have fair insight and awareness (despite cultural/language barrier).   Skin: Skin is warm and dry.    Results for orders placed during the hospital encounter of 07/18/14 (from the past 24 hour(s))  GLUCOSE, CAPILLARY     Status: None   Collection Time    07/19/14  6:42 AM      Result Value Ref Range   Glucose-Capillary 90  70 - 99 mg/dL   Comment 1 Notify RN     Comment 2 Documented in Chart    GLUCOSE, CAPILLARY     Status: Abnormal   Collection Time    07/19/14 11:27 AM      Result Value Ref Range   Glucose-Capillary 106 (*) 70 - 99 mg/dL  GLUCOSE, CAPILLARY     Status: None   Collection Time    07/19/14  4:38 PM      Result Value Ref Range   Glucose-Capillary 90  70 - 99 mg/dL  GLUCOSE, CAPILLARY     Status: None   Collection Time    07/19/14 10:44 PM      Result Value Ref Range   Glucose-Capillary 86  70 - 99 mg/dL   Dg Chest 2 View  4/54/0981   CLINICAL DATA:  Dyspnea.  EXAM: CHEST  2 VIEW  COMPARISON:  March 17, 2013.  FINDINGS: Stable cardiomediastinal silhouette. No pneumothorax or pleural effusion is noted. Degenerative disc disease is noted in the lower thoracic spine. No acute pulmonary disease is noted.  IMPRESSION: No acute cardiopulmonary abnormality seen.   Electronically Signed   By: Roque Lias M.D.   On: 07/18/2014 14:10   Mr Brain Wo Contrast  07/19/2014   ADDENDUM REPORT: 07/19/2014 13:19  ADDENDUM: Study discussed by telephone with Dr. Onalee Hua Tat on 07/19/2014 at 1254 hrs.   Electronically Signed   By: Augusto Gamble M.D.   On: 07/19/2014 13:19   07/19/2014   CLINICAL DATA:  80 year old female with acute onset right side weakness facial droop and slurred speech. Code stroke. Improved symptoms, patient was not a candidate for tPA.  Initial encounter.  EXAM: MRI HEAD WITHOUT CONTRAST  MRA HEAD WITHOUT CONTRAST  TECHNIQUE: Multiplanar, multiecho pulse sequences of the brain and surrounding structures were obtained without intravenous contrast. Angiographic images of the head were obtained using MRA technique without contrast.  COMPARISON:  Cervical spine MRI 03/16/2013.  FINDINGS: MRI HEAD FINDINGS  Oval 14 mm area of restricted diffusion in the lateral left thalamus abutting the posterior limb of the left internal capsule. T2 and FLAIR hyperintensity. No hemorrhage or mass effect. No other restricted diffusion identified. Major intracranial vascular flow voids are preserved. No midline shift, mass effect, evidence of mass lesion, ventriculomegaly, extra-axial collection or acute intracranial hemorrhage. Cervicomedullary junction and pituitary are within normal limits. Negative for age visualized cervical spine. Outside of the acute findings, normal for age gray and white matter signal throughout the brain. Visible internal auditory structures appear normal. Mastoids are clear. Minor paranasal sinus mucosal thickening. Postoperative changes to the globes. Visualized scalp soft tissues are within normal limits. Normal bone marrow signal.  MRA HEAD FINDINGS  Antegrade flow in the posterior circulation with codominant distal vertebral arteries. Normal vertebrobasilar junction. Normal right PICA origin. Left AICA may be dominant. Mildly tortuous basilar artery, no stenosis. SCA and PCA origins are within normal limits.  The left PCAs occluded in the P2 segment, with no distal left PCA flow (series 605, image 18). Posterior communicating arteries are diminutive or absent. Right PCA branches are within normal limits.  Antegrade flow in both ICA siphons. Mild tortuosity but no siphon stenosis identified. Ophthalmic artery origins are within normal limits. Carotid termini are patent. MCA and ACA origins are within normal limits. Anterior communicating  artery is normal. Median artery of the corpus callosum is present. Visualized bilateral ACA branches are within normal limits. Visualized bilateral MCA branches are within normal limits.  IMPRESSION: 1. Acute lacunar infarct (14 mm) in the lateral left thalamus near the posterior limb of the left internal capsule. No mass effect or hemorrhage. 2. The left PCA is occluded in the P2 segment, near the expected location of the left thalamostriate artery origins. 3. Otherwise negative intracranial MRA, and otherwise normal for age non contrast MRI appearance of the brain.  Electronically Signed: By: Augusto Gamble M.D. On: 07/19/2014 12:22   Mr Maxine Glenn Head/brain Wo Cm  07/19/2014   ADDENDUM REPORT: 07/19/2014 13:19  ADDENDUM:  Study discussed by telephone with Dr. Onalee Hua Tat on 07/19/2014 at 1254 hrs.   Electronically Signed   By: Augusto Gamble M.D.   On: 07/19/2014 13:19   07/19/2014   CLINICAL DATA:  80 year old female with acute onset right side weakness facial droop and slurred speech. Code stroke. Improved symptoms, patient was not a candidate for tPA. Initial encounter.  EXAM: MRI HEAD WITHOUT CONTRAST  MRA HEAD WITHOUT CONTRAST  TECHNIQUE: Multiplanar, multiecho pulse sequences of the brain and surrounding structures were obtained without intravenous contrast. Angiographic images of the head were obtained using MRA technique without contrast.  COMPARISON:  Cervical spine MRI 03/16/2013.  FINDINGS: MRI HEAD FINDINGS  Oval 14 mm area of restricted diffusion in the lateral left thalamus abutting the posterior limb of the left internal capsule. T2 and FLAIR hyperintensity. No hemorrhage or mass effect. No other restricted diffusion identified. Major intracranial vascular flow voids are preserved. No midline shift, mass effect, evidence of mass lesion, ventriculomegaly, extra-axial collection or acute intracranial hemorrhage. Cervicomedullary junction and pituitary are within normal limits. Negative for age visualized cervical  spine. Outside of the acute findings, normal for age gray and white matter signal throughout the brain. Visible internal auditory structures appear normal. Mastoids are clear. Minor paranasal sinus mucosal thickening. Postoperative changes to the globes. Visualized scalp soft tissues are within normal limits. Normal bone marrow signal.  MRA HEAD FINDINGS  Antegrade flow in the posterior circulation with codominant distal vertebral arteries. Normal vertebrobasilar junction. Normal right PICA origin. Left AICA may be dominant. Mildly tortuous basilar artery, no stenosis. SCA and PCA origins are within normal limits.  The left PCAs occluded in the P2 segment, with no distal left PCA flow (series 605, image 18). Posterior communicating arteries are diminutive or absent. Right PCA branches are within normal limits.  Antegrade flow in both ICA siphons. Mild tortuosity but no siphon stenosis identified. Ophthalmic artery origins are within normal limits. Carotid termini are patent. MCA and ACA origins are within normal limits. Anterior communicating artery is normal. Median artery of the corpus callosum is present. Visualized bilateral ACA branches are within normal limits. Visualized bilateral MCA branches are within normal limits.  IMPRESSION: 1. Acute lacunar infarct (14 mm) in the lateral left thalamus near the posterior limb of the left internal capsule. No mass effect or hemorrhage. 2. The left PCA is occluded in the P2 segment, near the expected location of the left thalamostriate artery origins. 3. Otherwise negative intracranial MRA, and otherwise normal for age non contrast MRI appearance of the brain.  Electronically Signed: By: Augusto Gamble M.D. On: 07/19/2014 12:22    Assessment/Plan: Diagnosis: left thalamus, PLIC infarct 1. Does the need for close, 24 hr/day medical supervision in concert with the patient's rehab needs make it unreasonable for this patient to be served in a less intensive setting?  Yes 2. Co-Morbidities requiring supervision/potential complications: dm, htn,  3. Due to bladder management, bowel management, safety, skin/wound care, disease management, medication administration, pain management and patient education, does the patient require 24 hr/day rehab nursing? Yes 4. Does the patient require coordinated care of a physician, rehab nurse, PT (1-2 hrs/day, 5 days/week) and OT (1-2 hrs/day, 5 days/week) to address physical and functional deficits in the context of the above medical diagnosis(es)? Yes Addressing deficits in the following areas: balance, endurance, locomotion, strength, transferring, bowel/bladder control, bathing, dressing, feeding, grooming, toileting and psychosocial support 5. Can the patient actively participate in an intensive therapy program of at least 3  hrs of therapy per day at least 5 days per week? Yes 6. The potential for patient to make measurable gains while on inpatient rehab is excellent 7. Anticipated functional outcomes upon discharge from inpatient rehab are modified independent  with PT, modified independent and supervision with OT, n/a with SLP. 8. Estimated rehab length of stay to reach the above functional goals is: 10-12 days 9. Does the patient have adequate social supports to accommodate these discharge functional goals? Potentially 10. Anticipated D/C setting: Home 11. Anticipated post D/C treatments: HH therapy 12. Overall Rehab/Functional Prognosis: excellent  RECOMMENDATIONS: This patient's condition is appropriate for continued rehabilitative care in the following setting: CIR Patient has agreed to participate in recommended program. Yes Note that insurance prior authorization may be required for reimbursement for recommended care.  Comment: Need to follow up on social supports. Son apparently works, but I believe she may be able to reach Mod I goals.   Ranelle Oyster, MD, [facility] Physical Medicine &  Rehabilitation     07/20/2014

## 2014-07-20 NOTE — Progress Notes (Signed)
PROGRESS NOTE  [name] [name] ZOX:[id number] DOB: 04-16-1930 DOA: 07/18/2014 PCP: Gwynneth Aliment, MD  Interim summary  80 year old female with a history of hypertension, essential tremor, diabetes mellitus, hyperlipidemia, DVT presented to the hospital in [place], [place] she experienced sudden onset of right-sided weakness, facial droop, slurred speech. The patient was not given IV TPA due to her thrombocytopenia and improving symptoms. She wanted to be transferred to [facility] for further workup. Neurology was consulted to see the patient.  MRI of brain reveals a left thalamic stroke consistent with the patient's symptoms Assessment/Plan:  Acute thalamic stroke -Appreciate neurology followup -MRI brain-acute left thalamic lacunar infarct -MRA brain--left PCA occlusion, out of proportion with small size of infarct -Echocardiogram--EF 60-65%, grade 1 diastolic dysfunction, no WMA  -Carotid duplex--negative for hemodynamically significant stenosis -Hemoglobin A1c--6.0  -LDL 62  -continue aspirin 811 mg daily  -PT/OT  Thrombocytopenia  -This appears to be chronic dating back to 2009  -serum B12--1082 -RBC folate--pending  -No active bleeding presently  -hep B and C serology neg Diabetes mellitus type 2  -Discontinued metformin  -Would not restart metformin as the patient's estimated creatinine clearance is approx 50  -in addition, given the patient's age, hemoglobin A1c 6.0-I would allow for more liberal glycemic control -NovoLog sliding scale  Dyspnea  -Chest x-ray--neg  -07/17/2014 EKG shows sinus rhythm without any ST changes  Hyperlipidemia  -Continue statin  Hypertension  -Atenolol and diltiazem have been held  -blood pressure remains stable off of antihypertensive medications  -Continue to monitor Left leg pain Patient's son stating that patient has had recent long travel no prior history of DVT. Patient denies any falls or trauma. Will get plain films of  the left lower extremity. The Dopplers of the left lower extremity to rule out DVT. NSAIDS as needed.   Family Communication:   Son updated at beside Disposition Plan: CIR hopefully tomorrow       Procedures/Studies: Dg Chest 2 View  07/18/2014   CLINICAL DATA:  Dyspnea.  EXAM: CHEST  2 VIEW  COMPARISON:  March 17, 2013.  FINDINGS: Stable cardiomediastinal silhouette. No pneumothorax or pleural effusion is noted. Degenerative disc disease is noted in the lower thoracic spine. No acute pulmonary disease is noted.  IMPRESSION: No acute cardiopulmonary abnormality seen.   Electronically Signed   By: Roque Lias M.D.   On: 07/18/2014 14:10   Mr Brain Wo Contrast  07/19/2014   CLINICAL DATA:  80 year old female with acute onset right side weakness facial droop and slurred speech. Code stroke. Improved symptoms, patient was not a candidate for tPA. Initial encounter.  EXAM: MRI HEAD WITHOUT CONTRAST  MRA HEAD WITHOUT CONTRAST  TECHNIQUE: Multiplanar, multiecho pulse sequences of the brain and surrounding structures were obtained without intravenous contrast. Angiographic images of the head were obtained using MRA technique without contrast.  COMPARISON:  Cervical spine MRI 03/16/2013.  FINDINGS: MRI HEAD FINDINGS  Oval 14 mm area of restricted diffusion in the lateral left thalamus abutting the posterior limb of the left internal capsule. T2 and FLAIR hyperintensity. No hemorrhage or mass effect. No other restricted diffusion identified. Major intracranial vascular flow voids are preserved. No midline shift, mass effect, evidence of mass lesion, ventriculomegaly, extra-axial collection or acute intracranial hemorrhage. Cervicomedullary junction and pituitary are within normal limits. Negative for age visualized cervical spine. Outside of the acute findings, normal for age gray and white matter signal throughout the brain. Visible internal auditory structures  appear normal. Mastoids are clear. Minor  paranasal sinus mucosal thickening. Postoperative changes to the globes. Visualized scalp soft tissues are within normal limits. Normal bone marrow signal.  MRA HEAD FINDINGS  Antegrade flow in the posterior circulation with codominant distal vertebral arteries. Normal vertebrobasilar junction. Normal right PICA origin. Left AICA may be dominant. Mildly tortuous basilar artery, no stenosis. SCA and PCA origins are within normal limits.  The left PCAs occluded in the P2 segment, with no distal left PCA flow (series 605, image 18). Posterior communicating arteries are diminutive or absent. Right PCA branches are within normal limits.  Antegrade flow in both ICA siphons. Mild tortuosity but no siphon stenosis identified. Ophthalmic artery origins are within normal limits. Carotid termini are patent. MCA and ACA origins are within normal limits. Anterior communicating artery is normal. Median artery of the corpus callosum is present. Visualized bilateral ACA branches are within normal limits. Visualized bilateral MCA branches are within normal limits.  IMPRESSION: 1. Acute lacunar infarct (14 mm) in the lateral left thalamus near the posterior limb of the left internal capsule. No mass effect or hemorrhage. 2. The left PCA is occluded in the P2 segment, near the expected location of the left thalamostriate artery origins. 3. Otherwise negative intracranial MRA, and otherwise normal for age non contrast MRI appearance of the brain.   Electronically Signed   By: Augusto Gamble M.D.   On: 07/19/2014 12:22   Mr Maxine Glenn Head/brain Wo Cm  07/19/2014   CLINICAL DATA:  80 year old female with acute onset right side weakness facial droop and slurred speech. Code stroke. Improved symptoms, patient was not a candidate for tPA. Initial encounter.  EXAM: MRI HEAD WITHOUT CONTRAST  MRA HEAD WITHOUT CONTRAST  TECHNIQUE: Multiplanar, multiecho pulse sequences of the brain and surrounding structures were obtained without intravenous contrast.  Angiographic images of the head were obtained using MRA technique without contrast.  COMPARISON:  Cervical spine MRI 03/16/2013.  FINDINGS: MRI HEAD FINDINGS  Oval 14 mm area of restricted diffusion in the lateral left thalamus abutting the posterior limb of the left internal capsule. T2 and FLAIR hyperintensity. No hemorrhage or mass effect. No other restricted diffusion identified. Major intracranial vascular flow voids are preserved. No midline shift, mass effect, evidence of mass lesion, ventriculomegaly, extra-axial collection or acute intracranial hemorrhage. Cervicomedullary junction and pituitary are within normal limits. Negative for age visualized cervical spine. Outside of the acute findings, normal for age gray and white matter signal throughout the brain. Visible internal auditory structures appear normal. Mastoids are clear. Minor paranasal sinus mucosal thickening. Postoperative changes to the globes. Visualized scalp soft tissues are within normal limits. Normal bone marrow signal.  MRA HEAD FINDINGS  Antegrade flow in the posterior circulation with codominant distal vertebral arteries. Normal vertebrobasilar junction. Normal right PICA origin. Left AICA may be dominant. Mildly tortuous basilar artery, no stenosis. SCA and PCA origins are within normal limits.  The left PCAs occluded in the P2 segment, with no distal left PCA flow (series 605, image 18). Posterior communicating arteries are diminutive or absent. Right PCA branches are within normal limits.  Antegrade flow in both ICA siphons. Mild tortuosity but no siphon stenosis identified. Ophthalmic artery origins are within normal limits. Carotid termini are patent. MCA and ACA origins are within normal limits. Anterior communicating artery is normal. Median artery of the corpus callosum is present. Visualized bilateral ACA branches are within normal limits. Visualized bilateral MCA branches are within normal limits.  IMPRESSION: 1. Acute  lacunar infarct (14 mm) in the lateral left thalamus near the posterior limb of the left internal capsule. No mass effect or hemorrhage. 2. The left PCA is occluded in the P2 segment, near the expected location of the left thalamostriate artery origins. 3. Otherwise negative intracranial MRA, and otherwise normal for age non contrast MRI appearance of the brain.   Electronically Signed   By: Augusto Gamble M.D.   On: 07/19/2014 12:22         Subjective: Patient is feeling well. There is no further dysarthria. Her right arm and right leg will feel stronger but not back to baseline. Patient complaining of left leg pain.  Objective: Filed Vitals:   07/20/14 0600 07/20/14 1008 07/20/14 1345 07/20/14 1741  BP: 112/36 125/47 124/49 137/67  Pulse: 71 81 83 93  Temp: 97.9 F (36.6 C) 97.7 F (36.5 C) 98 F (36.7 C) 98.6 F (37 C)  TempSrc: Oral Oral Oral Oral  Resp:  Height:      Weight:      SpO2: 96% 97% 100% 97%    Intake/Output Summary (Last 24 hours) at 07/20/14 1915 Last data filed at 07/20/14 1700  Gross per 24 hour  Intake    720 ml  Output      0 ml  Net    720 ml   Weight change:  Exam:   General:  Pt is alert, follows commands appropriately, not in acute distress  HEENT: No icterus, No thrush, [place]/AT  Cardiovascular: RRR, S1/S2, no rubs, no gallops  Respiratory: CTA bilaterally, no wheezing, no crackles, no rhonchi  Abdomen: Soft/+BS, non tender, non distended, no guarding  Extremities: No edema, No lymphangitis, No petechiae, No rashes, no synovitis  Data Reviewed: Basic Metabolic Panel:  Recent Labs Lab 07/19/14 0545  NA 140  K 3.8  CL 104  CO2 25  GLUCOSE 86  BUN 14  CREATININE 0.72  CALCIUM 8.6   Liver Function Tests: No results found for this basename: AST, ALT, ALKPHOS, BILITOT, PROT, ALBUMIN,  in the last 168 hours No results found for this basename: LIPASE, AMYLASE,  in the last 168 hours No results found for this basename: AMMONIA,   in the last 168 hours CBC:  Recent Labs Lab 07/18/14 0422 07/19/14 0545  WBC 6.0 4.1  HGB 13.9 14.0  HCT 41.7 41.8  MCV 90.3 90.3  PLT 104* 98*   Cardiac Enzymes: No results found for this basename: CKTOTAL, CKMB, CKMBINDEX, TROPONINI,  in the last 168 hours BNP: No components found with this basename: POCBNP,  CBG:  Recent Labs Lab 07/19/14 1638 07/19/14 2244 07/20/14 0643 07/20/14 1134 07/20/14 1723  GLUCAP 90 86 93 106* 129*    No results found for this or any previous visit (from the past 240 hour(s)).   Scheduled Meds: . acetaminophen  650 mg Oral QHS  . ALPRAZolam  0.25 mg Oral BID  . [START ON 07/21/2014] aspirin EC  81 mg Oral Daily  . atorvastatin  20 mg Oral q1800  . dipyridamole-aspirin  1 capsule Oral QHS   Followed by  . [START ON 08/03/2014] dipyridamole-aspirin  1 capsule Oral BID  . docusate sodium  100 mg Oral BID  . gabapentin  300 mg Oral QHS  . insulin aspart  0-5 Units Subcutaneous QHS  . insulin aspart  0-9 Units Subcutaneous TID WC  . polyethylene glycol  17 g Oral BID  . primidone  250 mg Oral BID WC  .  traMADol  50 mg Oral QHS   Continuous Infusions:    THOMPSON,DANIEL, MD  [facility] Pager [phone]  If 7PM-7AM, please contact night-coverage [email or web address] Password [email or web address] 07/20/2014, 7:15 PM   LOS: 2 days

## 2014-07-21 ENCOUNTER — Inpatient Hospital Stay (HOSPITAL_COMMUNITY): Payer: Medicare Other | Admitting: Speech Pathology

## 2014-07-21 ENCOUNTER — Inpatient Hospital Stay (HOSPITAL_COMMUNITY): Payer: Medicare Other | Admitting: Occupational Therapy

## 2014-07-21 ENCOUNTER — Inpatient Hospital Stay (HOSPITAL_COMMUNITY): Payer: Medicare Other

## 2014-07-21 ENCOUNTER — Inpatient Hospital Stay (HOSPITAL_COMMUNITY)
Admission: RE | Admit: 2014-07-21 | Discharge: 2014-07-30 | DRG: 945 | Disposition: A | Discharge status: Home Health | Payer: Medicare Other | Source: Intra-hospital | Attending: Physical Medicine & Rehabilitation | Admitting: Physical Medicine & Rehabilitation

## 2014-07-21 DIAGNOSIS — I6350 Cerebral infarction due to unspecified occlusion or stenosis of unspecified cerebral artery: Secondary | ICD-10-CM | POA: Diagnosis not present

## 2014-07-21 DIAGNOSIS — E1149 Type 2 diabetes mellitus with other diabetic neurological complication: Secondary | ICD-10-CM

## 2014-07-21 DIAGNOSIS — I10 Essential (primary) hypertension: Secondary | ICD-10-CM | POA: Diagnosis not present

## 2014-07-21 DIAGNOSIS — G250 Essential tremor: Secondary | ICD-10-CM | POA: Diagnosis not present

## 2014-07-21 DIAGNOSIS — E1142 Type 2 diabetes mellitus with diabetic polyneuropathy: Secondary | ICD-10-CM | POA: Diagnosis not present

## 2014-07-21 DIAGNOSIS — E785 Hyperlipidemia, unspecified: Secondary | ICD-10-CM | POA: Diagnosis not present

## 2014-07-21 DIAGNOSIS — R29898 Other symptoms and signs involving the musculoskeletal system: Secondary | ICD-10-CM | POA: Diagnosis not present

## 2014-07-21 DIAGNOSIS — F411 Generalized anxiety disorder: Secondary | ICD-10-CM | POA: Diagnosis not present

## 2014-07-21 DIAGNOSIS — Z5189 Encounter for other specified aftercare: Principal | ICD-10-CM

## 2014-07-21 DIAGNOSIS — Z79899 Other long term (current) drug therapy: Secondary | ICD-10-CM

## 2014-07-21 DIAGNOSIS — E118 Type 2 diabetes mellitus with unspecified complications: Secondary | ICD-10-CM

## 2014-07-21 DIAGNOSIS — G252 Other specified forms of tremor: Secondary | ICD-10-CM

## 2014-07-21 DIAGNOSIS — D696 Thrombocytopenia, unspecified: Secondary | ICD-10-CM | POA: Diagnosis not present

## 2014-07-21 DIAGNOSIS — I69993 Ataxia following unspecified cerebrovascular disease: Secondary | ICD-10-CM

## 2014-07-21 DIAGNOSIS — I639 Cerebral infarction, unspecified: Secondary | ICD-10-CM | POA: Diagnosis present

## 2014-07-21 DIAGNOSIS — Z7982 Long term (current) use of aspirin: Secondary | ICD-10-CM

## 2014-07-21 DIAGNOSIS — E119 Type 2 diabetes mellitus without complications: Secondary | ICD-10-CM

## 2014-07-21 LAB — GLUCOSE, CAPILLARY
GLUCOSE-CAPILLARY: 73 mg/dL (ref 70–99)
Glucose-Capillary: 99 mg/dL (ref 70–99)
Glucose-Capillary: 119 mg/dL — ABNORMAL HIGH (ref 70–99)
Glucose-Capillary: 103 mg/dL — ABNORMAL HIGH (ref 70–99)

## 2014-07-21 MED ORDER — INSULIN ASPART 100 UNIT/ML ~~LOC~~ SOLN: 0.0000 [IU] | Freq: Every day | SUBCUTANEOUS | Status: DC

## 2014-07-21 MED ORDER — POLYETHYLENE GLYCOL 3350 17 G PO PACK
17.0000 g | PACK | Freq: Two times a day (BID) | ORAL | Status: DC
  Administered 2014-07-21 – 2014-07-30 (×12): 17 g via ORAL
  Filled 2014-07-21 (×20): qty 1

## 2014-07-21 MED ORDER — ALPRAZOLAM 0.25 MG PO TABS
0.2500 mg | ORAL_TABLET | Freq: Two times a day (BID) | ORAL | Status: DC
  Administered 2014-07-21 – 2014-07-29 (×16): 0.25 mg via ORAL
  Filled 2014-07-21 (×16): qty 1

## 2014-07-21 MED ORDER — GABAPENTIN 300 MG PO CAPS
300.0000 mg | ORAL_CAPSULE | Freq: Every day | ORAL | Status: DC
  Administered 2014-07-21 – 2014-07-29 (×9): 300 mg via ORAL
  Filled 2014-07-21 (×10): qty 1

## 2014-07-21 MED ORDER — DOCUSATE SODIUM 100 MG PO CAPS
100.0000 mg | ORAL_CAPSULE | Freq: Two times a day (BID) | ORAL | Status: DC
  Administered 2014-07-21 – 2014-07-30 (×14): 100 mg via ORAL
  Filled 2014-07-21 (×20): qty 1

## 2014-07-21 MED ORDER — ONDANSETRON HCL 4 MG PO TABS: 4.0000 mg | ORAL_TABLET | Freq: Four times a day (QID) | ORAL | Status: DC | PRN

## 2014-07-21 MED ORDER — PRIMIDONE 250 MG PO TABS
250.0000 mg | ORAL_TABLET | Freq: Two times a day (BID) | ORAL | Status: DC
  Administered 2014-07-21 – 2014-07-30 (×18): 250 mg via ORAL
  Filled 2014-07-21 (×20): qty 1

## 2014-07-21 MED ORDER — ASPIRIN EC 81 MG PO TBEC
81.0000 mg | DELAYED_RELEASE_TABLET | Freq: Every day | ORAL | Status: DC
  Administered 2014-07-22 – 2014-07-30 (×9): 81 mg via ORAL
  Filled 2014-07-21 (×10): qty 1

## 2014-07-21 MED ORDER — TRAMADOL HCL 50 MG PO TABS
50.0000 mg | ORAL_TABLET | Freq: Every day | ORAL | Status: DC
  Administered 2014-07-21 – 2014-07-29 (×9): 50 mg via ORAL
  Filled 2014-07-21 (×9): qty 1

## 2014-07-21 MED ORDER — SORBITOL 70 % SOLN: 30.0000 mL | Freq: Every day | Status: DC | PRN

## 2014-07-21 MED ORDER — ATORVASTATIN CALCIUM 20 MG PO TABS
20.0000 mg | ORAL_TABLET | Freq: Every day | ORAL | Status: DC
  Administered 2014-07-21 – 2014-07-29 (×9): 20 mg via ORAL
  Filled 2014-07-21 (×10): qty 1

## 2014-07-21 MED ORDER — ONDANSETRON HCL 4 MG/2ML IJ SOLN: 4.0000 mg | Freq: Four times a day (QID) | INTRAMUSCULAR | Status: DC | PRN

## 2014-07-21 MED ORDER — ASPIRIN-DIPYRIDAMOLE ER 25-200 MG PO CP12
1.0000 | ORAL_CAPSULE | Freq: Every day | ORAL | Status: DC
  Administered 2014-07-21 – 2014-07-29 (×9): 1 via ORAL
  Filled 2014-07-21 (×10): qty 1

## 2014-07-21 MED ORDER — ACETAMINOPHEN 325 MG PO TABS
325.0000 mg | ORAL_TABLET | ORAL | Status: DC | PRN
  Administered 2014-07-21 – 2014-07-29 (×4): 650 mg via ORAL
  Filled 2014-07-21 (×5): qty 2
  Filled 2014-07-21 (×2): qty 1

## 2014-07-21 MED ORDER — ASPIRIN-DIPYRIDAMOLE ER 25-200 MG PO CP12: 1.0000 | ORAL_CAPSULE | Freq: Two times a day (BID) | ORAL | Status: DC

## 2014-07-21 NOTE — Progress Notes (Signed)
Physical Medicine and Rehabilitation Consult Reason for Consult: CVA Referring Physician: [facility]     HPI: [name] [name] is a 80 y.o. right-handed female from [place] who was visiting family in the area with history of thrombocytopenia, hypertension and newly diagnosed diabetes mellitus. Patient independent prior to admission. Presented 07/18/2014 with acute onset of right-sided weakness. MRI of the brain showed acute lacunar infarct in the lateral left thalamus near the posterior limb of the left internal capsule. MRA of the head showed left PCA to be occluded. Echocardiogram with ejection fraction of 65% grade 1 diastolic dysfunction. Carotid Dopplers with no ICA stenosis. Patient did not receive TPA. Neurology services consulted placed on aspirin for CVA prophylaxis. Tolerating a regular consistency diet. Physical therapy evaluation completed a 25 2015 with recommendations of physical medicine rehabilitation consult.   Review of Systems  Gastrointestinal: Positive for constipation.  Neurological:        Essential tremor  Psychiatric/Behavioral:        Anxiety  All other systems reviewed and are negative. Past Medical History   Diagnosis  Date   .  Hypertension     .  High cholesterol     .  DVT (deep venous thrombosis)     .  Pneumonia     .  Anxiety     .  Essential tremor  10/27/2013   .  Obesity, unspecified  10/27/2013   .  Newly diagnosed diabetes  10/27/2013   .  Unspecified hereditary and idiopathic peripheral neuropathy  10/27/2013    Past Surgical History   Procedure  Laterality  Date   .  Foot surgery       .  Cataract extraction  Bilateral      Family History   Problem  Relation  Age of Onset   .  Coronary artery disease  Other      Social History: reports that she has never smoked. She does not have any smokeless tobacco history on file. She reports that she does not drink alcohol or use illicit drugs. Allergies: No Known Allergies Medications Prior to  Admission   Medication  Sig  Dispense  Refill   .  ALPRAZolam (XANAX) 0.25 MG tablet  Take 0.25 mg by mouth 2 (two) times daily.         [name]  aspirin 81 MG tablet  Take 81 mg by mouth daily.         [name]  atenolol (TENORMIN) 50 MG tablet  Take 25 mg by mouth daily.          [name]  atorvastatin (LIPITOR) 20 MG tablet  Take 20 mg by mouth daily.         [name]  diltiazem (CARDIZEM) 30 MG tablet  Take 15 mg by mouth every morning.          .  docusate sodium (COLACE) 100 MG capsule  Take 1 capsule (100 mg total) by mouth 2 (two) times daily.   10 capsule   0   .  furosemide (LASIX) 20 MG tablet  Take 20 mg by mouth daily.         [name]  gabapentin (NEURONTIN) 100 MG capsule  Take 100 mg by mouth 2 (two) times daily.         .  metFORMIN (GLUCOPHAGE) 500 MG tablet  Take 250 mg by mouth daily with breakfast.          .  potassium chloride (K-DUR,KLOR-CON) 10 MEQ tablet  Take  10 mEq by mouth daily.         .  primidone (MYSOLINE) 250 MG tablet  Take 1 tablet (250 mg total) by mouth 2 (two) times daily.   180 tablet   3   .  [DISCONTINUED] gabapentin (NEURONTIN) 100 MG capsule  Take 3 capsules (300 mg total) by mouth at bedtime.   270 capsule   3   .  traMADol (ULTRAM) 50 MG tablet  Take 50 mg by mouth at bedtime.         .  [DISCONTINUED] sulfamethoxazole-trimethoprim (SEPTRA DS) 800-160 MG per tablet  Take 1 tablet by mouth 2 (two) times daily.   6 tablet   0      Home: Home Living Family/patient expects to be discharged to:: Private residence Living Arrangements: Children;Other relatives Additional Comments: Pt does not speak English well.  No family present to confirm living situation.   Functional History: Prior Function Comments: No family available to report.  Functional Status:   Mobility: Bed Mobility Overal bed mobility: Needs Assistance Bed Mobility: Supine to Sit Supine to sit: Min assist General bed mobility comments: Min assist to support her trunk when transitioning to sitting.  Pt is using  her bil arms to pull up against the bed rail on her bed for the transitio.  Transfers Overall transfer level: Needs assistance Equipment used: 1 person hand held assist;2 person hand held assist Transfers: Sit to/from Stand Sit to Stand: Min assist General transfer comment: Min assist to stand from both the bed and the toilet.  Assist needed to help pt power up over her legs and for balance as pt has a tendancy for posterior LOB.  Ambulation/Gait Ambulation/Gait assistance: +2 physical assistance;Mod assist Ambulation Distance (Feet): 15 Feet Assistive device: 2 person hand held assist Gait Pattern/deviations: Decreased step length - right;Decreased stance time - right Gait velocity: decreased General Gait Details: Pt with buckling when WB on right leg during gait requiring two person mod assist for gait to the bathroom and back to her chair.  Pt reaching, when able, with bil upper extremities to support surfaces for balance and stability.    ADL:   Cognition: Cognition Overall Cognitive Status: No family/caregiver present to determine baseline cognitive functioning Orientation Level: Oriented X4;Other (comment) (Pt doesn't speak English; answered questions appropriately) Cognition Arousal/Alertness: Awake/alert Behavior During Therapy: WFL for tasks assessed/performed Overall Cognitive Status: No family/caregiver present to determine baseline cognitive functioning   Blood pressure 120/63, pulse 67, temperature 98.2 F (36.8 C), temperature source Oral, resp. rate 16, height  (1.448 m), weight 68.992 kg (152 lb 1.6 oz), SpO2 99.00%. Physical Exam  Constitutional: She appears well-developed.  HENT:   Head: Normocephalic.  Eyes: EOM are normal.  Neck: Normal range of motion. Neck supple. No thyromegaly present.  Cardiovascular: Normal rate and regular rhythm.   Respiratory: Effort normal and breath sounds normal. No respiratory distress.  GI: Soft. Bowel sounds are normal.  She exhibits no distension.  Neurological: She is alert.  Patient limited English speaking. She does make good eye contact with examiner. She was able to communicate her name, age and follows simple commands. Mild right upper extremity weakness---4/5 prox to distal. RLE: HF 2/5, KE 3/5. ADF/APF 3+/5. Subtle difference in LT/PP on right (vs left). Seems to have fair insight and awareness (despite cultural/language barrier).   Skin: Skin is warm and dry.     Results for orders placed during the hospital encounter of 07/18/14 (from the past 24  hour(s))   GLUCOSE, CAPILLARY     Status: None     Collection Time      07/19/14  6:42 AM       Result  Value  Ref Range     Glucose-Capillary  90   70 - 99 mg/dL     Comment 1  Notify RN        Comment 2  Documented in Chart      GLUCOSE, CAPILLARY     Status: Abnormal     Collection Time      07/19/14 11:27 AM       Result  Value  Ref Range     Glucose-Capillary  106 (*)  70 - 99 mg/dL   GLUCOSE, CAPILLARY     Status: None     Collection Time      07/19/14  4:38 PM       Result  Value  Ref Range     Glucose-Capillary  90   70 - 99 mg/dL   GLUCOSE, CAPILLARY     Status: None     Collection Time      07/19/14 10:44 PM       Result  Value  Ref Range     Glucose-Capillary  86   70 - 99 mg/dL    Dg Chest 2 View   0/86/5784   CLINICAL DATA:  Dyspnea.  EXAM: CHEST  2 VIEW  COMPARISON:  March 17, 2013.  FINDINGS: Stable cardiomediastinal silhouette. No pneumothorax or pleural effusion is noted. Degenerative disc disease is noted in the lower thoracic spine. No acute pulmonary disease is noted.  IMPRESSION: No acute cardiopulmonary abnormality seen.   Electronically Signed   By: Roque Lias M.D.   On: 07/18/2014 14:10    Mr Brain Wo Contrast   07/19/2014   ADDENDUM REPORT: 07/19/2014 13:19  ADDENDUM: Study discussed by telephone with Dr. Onalee Hua Tat on 07/19/2014 at 1254 hrs.   Electronically Signed   By: Augusto Gamble M.D.   On: 07/19/2014 13:19     07/19/2014   CLINICAL DATA:  80 year old female with acute onset right side weakness facial droop and slurred speech. Code stroke. Improved symptoms, patient was not a candidate for tPA. Initial encounter.  EXAM: MRI HEAD WITHOUT CONTRAST  MRA HEAD WITHOUT CONTRAST  TECHNIQUE: Multiplanar, multiecho pulse sequences of the brain and surrounding structures were obtained without intravenous contrast. Angiographic images of the head were obtained using MRA technique without contrast.  COMPARISON:  Cervical spine MRI 03/16/2013.  FINDINGS: MRI HEAD FINDINGS  Oval 14 mm area of restricted diffusion in the lateral left thalamus abutting the posterior limb of the left internal capsule. T2 and FLAIR hyperintensity. No hemorrhage or mass effect. No other restricted diffusion identified. Major intracranial vascular flow voids are preserved. No midline shift, mass effect, evidence of mass lesion, ventriculomegaly, extra-axial collection or acute intracranial hemorrhage. Cervicomedullary junction and pituitary are within normal limits. Negative for age visualized cervical spine. Outside of the acute findings, normal for age gray and white matter signal throughout the brain. Visible internal auditory structures appear normal. Mastoids are clear. Minor paranasal sinus mucosal thickening. Postoperative changes to the globes. Visualized scalp soft tissues are within normal limits. Normal bone marrow signal.  MRA HEAD FINDINGS  Antegrade flow in the posterior circulation with codominant distal vertebral arteries. Normal vertebrobasilar junction. Normal right PICA origin. Left AICA may be dominant. Mildly tortuous basilar artery, no stenosis. SCA and PCA origins are within normal limits.  The left PCAs occluded in the P2 segment, with no distal left PCA flow (series 605, image 18). Posterior communicating arteries are diminutive or absent. Right PCA branches are within normal limits.  Antegrade flow in both ICA siphons. Mild  tortuosity but no siphon stenosis identified. Ophthalmic artery origins are within normal limits. Carotid termini are patent. MCA and ACA origins are within normal limits. Anterior communicating artery is normal. Median artery of the corpus callosum is present. Visualized bilateral ACA branches are within normal limits. Visualized bilateral MCA branches are within normal limits.  IMPRESSION: 1. Acute lacunar infarct (14 mm) in the lateral left thalamus near the posterior limb of the left internal capsule. No mass effect or hemorrhage. 2. The left PCA is occluded in the P2 segment, near the expected location of the left thalamostriate artery origins. 3. Otherwise negative intracranial MRA, and otherwise normal for age non contrast MRI appearance of the brain.  Electronically Signed: By: Augusto Gamble M.D. On: 07/19/2014 12:22    Mr Maxine Glenn Head/brain Wo Cm   07/19/2014   ADDENDUM REPORT: 07/19/2014 13:19  ADDENDUM: Study discussed by telephone with Dr. Onalee Hua Tat on 07/19/2014 at 1254 hrs.   Electronically Signed   By: Augusto Gamble M.D.   On: 07/19/2014 13:19    07/19/2014   CLINICAL DATA:  80 year old female with acute onset right side weakness facial droop and slurred speech. Code stroke. Improved symptoms, patient was not a candidate for tPA. Initial encounter.  EXAM: MRI HEAD WITHOUT CONTRAST  MRA HEAD WITHOUT CONTRAST  TECHNIQUE: Multiplanar, multiecho pulse sequences of the brain and surrounding structures were obtained without intravenous contrast. Angiographic images of the head were obtained using MRA technique without contrast.  COMPARISON:  Cervical spine MRI 03/16/2013.  FINDINGS: MRI HEAD FINDINGS  Oval 14 mm area of restricted diffusion in the lateral left thalamus abutting the posterior limb of the left internal capsule. T2 and FLAIR hyperintensity. No hemorrhage or mass effect. No other restricted diffusion identified. Major intracranial vascular flow voids are preserved. No midline shift, mass effect,  evidence of mass lesion, ventriculomegaly, extra-axial collection or acute intracranial hemorrhage. Cervicomedullary junction and pituitary are within normal limits. Negative for age visualized cervical spine. Outside of the acute findings, normal for age gray and white matter signal throughout the brain. Visible internal auditory structures appear normal. Mastoids are clear. Minor paranasal sinus mucosal thickening. Postoperative changes to the globes. Visualized scalp soft tissues are within normal limits. Normal bone marrow signal.  MRA HEAD FINDINGS  Antegrade flow in the posterior circulation with codominant distal vertebral arteries. Normal vertebrobasilar junction. Normal right PICA origin. Left AICA may be dominant. Mildly tortuous basilar artery, no stenosis. SCA and PCA origins are within normal limits.  The left PCAs occluded in the P2 segment, with no distal left PCA flow (series 605, image 18). Posterior communicating arteries are diminutive or absent. Right PCA branches are within normal limits.  Antegrade flow in both ICA siphons. Mild tortuosity but no siphon stenosis identified. Ophthalmic artery origins are within normal limits. Carotid termini are patent. MCA and ACA origins are within normal limits. Anterior communicating artery is normal. Median artery of the corpus callosum is present. Visualized bilateral ACA branches are within normal limits. Visualized bilateral MCA branches are within normal limits.  IMPRESSION: 1. Acute lacunar infarct (14 mm) in the lateral left thalamus near the posterior limb of the left internal capsule. No mass effect or hemorrhage. 2. The left PCA is occluded in the P2 segment,  near the expected location of the left thalamostriate artery origins. 3. Otherwise negative intracranial MRA, and otherwise normal for age non contrast MRI appearance of the brain.  Electronically Signed: By: Augusto Gamble M.D. On: 07/19/2014 12:22     Assessment/Plan: Diagnosis: left  thalamus, PLIC infarct Does the need for close, 24 hr/day medical supervision in concert with the patient's rehab needs make it unreasonable for this patient to be served in a less intensive setting? Yes Co-Morbidities requiring supervision/potential complications: dm, htn,   Due to bladder management, bowel management, safety, skin/wound care, disease management, medication administration, pain management and patient education, does the patient require 24 hr/day rehab nursing? Yes Does the patient require coordinated care of a physician, rehab nurse, PT (1-2 hrs/day, 5 days/week) and OT (1-2 hrs/day, 5 days/week) to address physical and functional deficits in the context of the above medical diagnosis(es)? Yes Addressing deficits in the following areas: balance, endurance, locomotion, strength, transferring, bowel/bladder control, bathing, dressing, feeding, grooming, toileting and psychosocial support Can the patient actively participate in an intensive therapy program of at least 3 hrs of therapy per day at least 5 days per week? Yes The potential for patient to make measurable gains while on inpatient rehab is excellent Anticipated functional outcomes upon discharge from inpatient rehab are modified independent  with PT, modified independent and supervision with OT, n/a with SLP. Estimated rehab length of stay to reach the above functional goals is: 10-12 days Does the patient have adequate social supports to accommodate these discharge functional goals? Potentially Anticipated D/C setting: Home Anticipated post D/C treatments: HH therapy Overall Rehab/Functional Prognosis: excellent   RECOMMENDATIONS: This patient's condition is appropriate for continued rehabilitative care in the following setting: CIR Patient has agreed to participate in recommended program. Yes Note that insurance prior authorization may be required for reimbursement for recommended care.   Comment: Need to follow up on  social supports. Son apparently works, but I believe she may be able to reach Mod I goals.    Ranelle Oyster, MD, [facility] Physical Medicine & Rehabilitation         07/20/2014    Revision History...     Date/Time User Action   07/20/2014 9:25 AM Ranelle Oyster, MD Sign   07/20/2014 6:50 AM Charlton Amor, PA-C Pend  View Details Report   Routing History...     Date/Time From To Method   07/20/2014 9:25 AM Ranelle Oyster, MD Ranelle Oyster, MD In Basket   07/20/2014 9:25 AM Ranelle Oyster, MD Dorothyann Peng, MD Fax

## 2014-07-21 NOTE — Progress Notes (Signed)
Received pt. As a transfer,pt. Oriented to the unit.Safety video was showed to pt.Pt. Skin is intact.Keep monitoring pt. Closely and assessing her needs.

## 2014-07-21 NOTE — Discharge Summary (Signed)
Physician Discharge Summary  SYBEL KOSTIUK UJW:[id number] DOB: [birth year] DOA: 07/18/2014  PCP: Gwynneth Aliment, MD  Admit date: 07/18/2014 Discharge date: 07/21/2014  Time spent: 65 minutes  Recommendations for Outpatient Follow-up:  1. Patient be discharged to inpatient rehabilitation for further treatment. 2. Followup with PCP as outpatient post discharge from inpatient rehabilitation.  Discharge Diagnoses:  Principal Problem:   Acute ischemic stroke Active Problems:   Essential tremor   DM2 (diabetes mellitus, type 2)   Unspecified hereditary and idiopathic peripheral neuropathy   HTN (hypertension)   HLD (hyperlipidemia)   Thrombocytopenia, unspecified   Stroke   Left leg pain   Discharge Condition: Stable and improved  Diet recommendation: Heart healthy  Filed Weights   07/18/14 0130  Weight: 68.992 kg (152 lb 1.6 oz)    History of present illness:   [name] [name] is a 80 y.o. female who was in [place] on day of admission, visiting family when at ~6pm or so she suffered onset of R sided weakness, facial droop, slurred speech. Family immediately recognized signs and symptoms of stroke and called 911. Patient brought to hospital in [place] where she was a code stroke. CT head negative for bleed, but she was not given TPA secondary to a platelet count of only 90 on her lab work. Her slurred speech and R sided weakness improved somewhat since her initial presentation according to her son, but she had persistent deficits  Hospital Course:  #1 acute thalamic stroke Patient had presented with persistent symptoms of slurred speech and right sided weakness after being seen at outside hospital in [facility]. CT of the head which was done there was negative follow acute hemorrhage. Patient was not given TPA at that time secondary to thrombocytopenia with platelet count of 90. MRI/MRA of the head was done which was consistent with a left thalamic stroke.  Neurology was consulted and the stroke team followed the patient throughout the hospitalization. MRA which was done and showed a left PCA occlusion out of proportion with the small size of the infarct. 2-D echo which was done at the EF of 60-65% with no wall motion abnormalities and no source of emboli. Carotid Dopplers which were done were negative for any significant ICA stenosis. Hemoglobin A1c which was done came back at 6.0. Fasting lipid panel which was done at the LDL of 62. Patient was initially placed on aspirin for secondary stroke prevention. Stroke team had discussions with the patient's husband and was recommended to change patient's aspirin to Aggrenox orally twice a day for secondary stroke prevention. Noted to prevent headache patient was started on Aggrenox at bedtime with baby aspirin for 2 weeks and then to Aggrenox twice daily with baby aspirin been discontinued at that time. Patient improved clinically and patient was subsequently transferred to the inpatient rehabilitation team for further therapy. Patient was discharged in stable condition.  #2 thrombocytopenia It was noted on admission the patient did have a thrombocytopenia which appeared to be chronic dating back to 2009. B12 levels and to check him back at 1082. Patient had no active bleeding. Hepatitis B and hepatitis C serology which was done was negative. Patient's platelets remained stable. Outpatient followup.  #3 type 2 diabetes During the hospitalization patient's metformin was discontinued on was recommended not to be resumed at discharge secondary to it creatinine clearance of approximately 50. Patient's hemoglobin A1c was noted to be at 6. Patient was maintained on a sliding scale insulin throughout the hospitalization. Outpatient followup.  #  4 dyspnea Patient was noted to have some episodes of dyspnea. Chest x-ray which was done was negative. EKG which was done showed no significant ST-T wave abnormalities. 2-D echo  which was done at the EF of 60-65% with no wall motion abnormalities. Lower extremity Dopplers also done were negative.  #5 hyperlipidemia Fasting lipid panel which was done had LDL of 62. Patient was maintained on a home regimen of statin.  #6 hypertension On admission patient's BP meds were held. BP remained stable and maybe resumed as BP dictates.  #7 left leg pain Likely musculoskeletal in nature. Patient denied any falls or trauma. Plain x-rays of the lower extremity which were obtained were negative for any fracture. Lower extremity Dopplers which were done were negative for DVT. Patient was placed on NSAIDs as needed with improvement in the left leg pain.  Procedures:  Lower extremity Dopplers 07/20/2014  MRI/MRA head  2-D echo  Carotid Dopplers  Consultations:  Neurology: Dr. Amada Jupiter 07/18/2014  Physical medicine and rehabilitation: Dr. Riley Kill 07/20/2014  Discharge Exam: Filed Vitals:   07/21/14 0947  BP: 105/56  Pulse: 90  Temp: 97.7 F (36.5 C)  Resp: 18    General: NAD Cardiovascular: RRR Respiratory: CTAB  Discharge Instructions You were cared for by a hospitalist during your hospital stay. If you have any questions about your discharge medications or the care you received while you were in the hospital after you are discharged, you can call the unit and asked to speak with the hospitalist on call if the hospitalist that took care of you is not available. Once you are discharged, your primary care physician will handle any further medical issues. Please note that NO REFILLS for any discharge medications will be authorized once you are discharged, as it is imperative that you return to your primary care physician (or establish a relationship with a primary care physician if you do not have one) for your aftercare needs so that they can reassess your need for medications and monitor your lab values.     Medication List    STOP taking these medications        sulfamethoxazole-trimethoprim 800-160 MG per tablet  Commonly known as:  SEPTRA DS      TAKE these medications       ALPRAZolam 0.25 MG tablet  Commonly known as:  XANAX  Take 0.25 mg by mouth 2 (two) times daily.     aspirin 81 MG tablet  Take 81 mg by mouth daily.     atenolol 50 MG tablet  Commonly known as:  TENORMIN  Take 25 mg by mouth daily.     atorvastatin 20 MG tablet  Commonly known as:  LIPITOR  Take 20 mg by mouth daily.     diltiazem 30 MG tablet  Commonly known as:  CARDIZEM  Take 15 mg by mouth every morning.     docusate sodium 100 MG capsule  Commonly known as:  COLACE  Take 1 capsule (100 mg total) by mouth 2 (two) times daily.     furosemide 20 MG tablet  Commonly known as:  LASIX  Take 20 mg by mouth daily.     gabapentin 100 MG capsule  Commonly known as:  NEURONTIN  Take 100 mg by mouth 2 (two) times daily.     metFORMIN 500 MG tablet  Commonly known as:  GLUCOPHAGE  Take 250 mg by mouth daily with breakfast.     potassium chloride 10 MEQ tablet  Commonly known  as:  K-DUR,KLOR-CON  Take 10 mEq by mouth daily.     primidone 250 MG tablet  Commonly known as:  MYSOLINE  Take 1 tablet (250 mg total) by mouth 2 (two) times daily.     traMADol 50 MG tablet  Commonly known as:  ULTRAM  Take 50 mg by mouth at bedtime.       No Known Allergies     Follow-up Information   Follow up with Huston Foley, MD. (F/U AS SCHEDULED IN [place])    Specialties:  Neurology, Radiology   Contact information:   [address] [place] [address] [phone]       Follow up with Gwynneth Aliment, MD. Schedule an appointment as soon as possible for a visit in 1 week. (F/U 1 WEEK POST DISCHARGE FROM CIR)    Specialty:  Internal Medicine   Contact information:   [address] [place] [address] [phone]        The results of significant diagnostics from this hospitalization (including imaging,  microbiology, ancillary and laboratory) are listed below for reference.    Significant Diagnostic Studies: Dg Chest 2 View  07/18/2014   CLINICAL DATA:  Dyspnea.  EXAM: CHEST  2 VIEW  COMPARISON:  March 17, 2013.  FINDINGS: Stable cardiomediastinal silhouette. No pneumothorax or pleural effusion is noted. Degenerative disc disease is noted in the lower thoracic spine. No acute pulmonary disease is noted.  IMPRESSION: No acute cardiopulmonary abnormality seen.   Electronically Signed   By: Roque Lias M.D.   On: 07/18/2014 14:10   Dg Hip Complete Left  07/20/2014   CLINICAL DATA:  Left leg pain  EXAM: LEFT HIP - COMPLETE 2+ VIEW  COMPARISON:  None.  FINDINGS: There is no evidence of hip fracture or dislocation. There is mild narrow bilateral hip joint spaces. Osteitis pubis is noted.  IMPRESSION: No acute fracture or dislocation.   Electronically Signed   By: Sherian Rein M.D.   On: 07/20/2014 13:07   Dg Femur Left  07/20/2014   CLINICAL DATA:  Left leg pain, no known injury.  EXAM: LEFT FEMUR - 2 VIEW  COMPARISON:  None.  FINDINGS: There is no evidence of fracture or dislocation. Soft tissues are unremarkable.  IMPRESSION: No acute fracture or dislocation of the left femur.   Electronically Signed   By: Sherian Rein M.D.   On: 07/20/2014 13:08   Dg Tibia/fibula Left  07/20/2014   CLINICAL DATA:  Left leg pain, no known injury.  EXAM: LEFT TIBIA AND FIBULA - 2 VIEW  COMPARISON:  None.  FINDINGS: There is no evidence of fracture or dislocation. Soft tissues are unremarkable.  IMPRESSION: No acute fracture or dislocation.   Electronically Signed   By: Sherian Rein M.D.   On: 07/20/2014 13:09   Mr Brain Wo Contrast  07/19/2014   ADDENDUM REPORT: 07/19/2014 13:19  ADDENDUM: Study discussed by telephone with Dr. Onalee Hua Tat on 07/19/2014 at 1254 hrs.   Electronically Signed   By: Augusto Gamble M.D.   On: 07/19/2014 13:19   07/19/2014   CLINICAL DATA:  80 year old female with acute onset right side weakness  facial droop and slurred speech. Code stroke. Improved symptoms, patient was not a candidate for tPA. Initial encounter.  EXAM: MRI HEAD WITHOUT CONTRAST  MRA HEAD WITHOUT CONTRAST  TECHNIQUE: Multiplanar, multiecho pulse sequences of the brain and surrounding structures were obtained without intravenous contrast. Angiographic images of the head were obtained using MRA  technique without contrast.  COMPARISON:  Cervical spine MRI 03/16/2013.  FINDINGS: MRI HEAD FINDINGS  Oval 14 mm area of restricted diffusion in the lateral left thalamus abutting the posterior limb of the left internal capsule. T2 and FLAIR hyperintensity. No hemorrhage or mass effect. No other restricted diffusion identified. Major intracranial vascular flow voids are preserved. No midline shift, mass effect, evidence of mass lesion, ventriculomegaly, extra-axial collection or acute intracranial hemorrhage. Cervicomedullary junction and pituitary are within normal limits. Negative for age visualized cervical spine. Outside of the acute findings, normal for age gray and white matter signal throughout the brain. Visible internal auditory structures appear normal. Mastoids are clear. Minor paranasal sinus mucosal thickening. Postoperative changes to the globes. Visualized scalp soft tissues are within normal limits. Normal bone marrow signal.  MRA HEAD FINDINGS  Antegrade flow in the posterior circulation with codominant distal vertebral arteries. Normal vertebrobasilar junction. Normal right PICA origin. Left AICA may be dominant. Mildly tortuous basilar artery, no stenosis. SCA and PCA origins are within normal limits.  The left PCAs occluded in the P2 segment, with no distal left PCA flow (series 605, image 18). Posterior communicating arteries are diminutive or absent. Right PCA branches are within normal limits.  Antegrade flow in both ICA siphons. Mild tortuosity but no siphon stenosis identified. Ophthalmic artery origins are within normal  limits. Carotid termini are patent. MCA and ACA origins are within normal limits. Anterior communicating artery is normal. Median artery of the corpus callosum is present. Visualized bilateral ACA branches are within normal limits. Visualized bilateral MCA branches are within normal limits.  IMPRESSION: 1. Acute lacunar infarct (14 mm) in the lateral left thalamus near the posterior limb of the left internal capsule. No mass effect or hemorrhage. 2. The left PCA is occluded in the P2 segment, near the expected location of the left thalamostriate artery origins. 3. Otherwise negative intracranial MRA, and otherwise normal for age non contrast MRI appearance of the brain.  Electronically Signed: By: Augusto Gamble M.D. On: 07/19/2014 12:22   Dg Foot Complete Left  07/20/2014   CLINICAL DATA:  Left leg pain  EXAM: LEFT FOOT - COMPLETE 3+ VIEW  COMPARISON:  None.  FINDINGS: There is no evidence of fracture or dislocation. There is calcification at the insertion of Achilles tendon to the calcaneus. Soft tissues are unremarkable.  IMPRESSION: No acute fracture or dislocation.   Electronically Signed   By: Sherian Rein M.D.   On: 07/20/2014 13:11   Mr Maxine Glenn Head/brain Wo Cm  07/19/2014   ADDENDUM REPORT: 07/19/2014 13:19  ADDENDUM: Study discussed by telephone with Dr. Onalee Hua Tat on 07/19/2014 at 1254 hrs.   Electronically Signed   By: Augusto Gamble M.D.   On: 07/19/2014 13:19   07/19/2014   CLINICAL DATA:  80 year old female with acute onset right side weakness facial droop and slurred speech. Code stroke. Improved symptoms, patient was not a candidate for tPA. Initial encounter.  EXAM: MRI HEAD WITHOUT CONTRAST  MRA HEAD WITHOUT CONTRAST  TECHNIQUE: Multiplanar, multiecho pulse sequences of the brain and surrounding structures were obtained without intravenous contrast. Angiographic images of the head were obtained using MRA technique without contrast.  COMPARISON:  Cervical spine MRI 03/16/2013.  FINDINGS: MRI HEAD FINDINGS   Oval 14 mm area of restricted diffusion in the lateral left thalamus abutting the posterior limb of the left internal capsule. T2 and FLAIR hyperintensity. No hemorrhage or mass effect. No other restricted diffusion identified. Major intracranial vascular flow voids are  preserved. No midline shift, mass effect, evidence of mass lesion, ventriculomegaly, extra-axial collection or acute intracranial hemorrhage. Cervicomedullary junction and pituitary are within normal limits. Negative for age visualized cervical spine. Outside of the acute findings, normal for age gray and white matter signal throughout the brain. Visible internal auditory structures appear normal. Mastoids are clear. Minor paranasal sinus mucosal thickening. Postoperative changes to the globes. Visualized scalp soft tissues are within normal limits. Normal bone marrow signal.  MRA HEAD FINDINGS  Antegrade flow in the posterior circulation with codominant distal vertebral arteries. Normal vertebrobasilar junction. Normal right PICA origin. Left AICA may be dominant. Mildly tortuous basilar artery, no stenosis. SCA and PCA origins are within normal limits.  The left PCAs occluded in the P2 segment, with no distal left PCA flow (series 605, image 18). Posterior communicating arteries are diminutive or absent. Right PCA branches are within normal limits.  Antegrade flow in both ICA siphons. Mild tortuosity but no siphon stenosis identified. Ophthalmic artery origins are within normal limits. Carotid termini are patent. MCA and ACA origins are within normal limits. Anterior communicating artery is normal. Median artery of the corpus callosum is present. Visualized bilateral ACA branches are within normal limits. Visualized bilateral MCA branches are within normal limits.  IMPRESSION: 1. Acute lacunar infarct (14 mm) in the lateral left thalamus near the posterior limb of the left internal capsule. No mass effect or hemorrhage. 2. The left PCA is occluded  in the P2 segment, near the expected location of the left thalamostriate artery origins. 3. Otherwise negative intracranial MRA, and otherwise normal for age non contrast MRI appearance of the brain.  Electronically Signed: By: Augusto Gamble M.D. On: 07/19/2014 12:22    Microbiology: No results found for this or any previous visit (from the past 240 hour(s)).   Labs: Basic Metabolic Panel:  Recent Labs Lab 07/19/14 0545  NA 140  K 3.8  CL 104  CO2 25  GLUCOSE 86  BUN 14  CREATININE 0.72  CALCIUM 8.6   Liver Function Tests: No results found for this basename: AST, ALT, ALKPHOS, BILITOT, PROT, ALBUMIN,  in the last 168 hours No results found for this basename: LIPASE, AMYLASE,  in the last 168 hours No results found for this basename: AMMONIA,  in the last 168 hours CBC:  Recent Labs Lab 07/18/14 0422 07/19/14 0545  WBC 6.0 4.1  HGB 13.9 14.0  HCT 41.7 41.8  MCV 90.3 90.3  PLT 104* 98*   Cardiac Enzymes: No results found for this basename: CKTOTAL, CKMB, CKMBINDEX, TROPONINI,  in the last 168 hours BNP: BNP (last 3 results) No results found for this basename: PROBNP,  in the last 8760 hours CBG:  Recent Labs Lab 07/20/14 1134 07/20/14 1723 07/20/14 2105 07/21/14 0649 07/21/14 1143  GLUCAP 106* 129* 130* 99 73       Signed:  Giavonni Fonder MD [facility] 07/21/2014, 2:07 PM

## 2014-07-21 NOTE — Progress Notes (Signed)
PMR Admission Coordinator Pre-Admission Assessment   Patient: [name] [name] is an 80 y.o., female MRN: [id number] DOB: Jul 02, 1930 Height:  (144.8 cm) Weight: 68.992 kg (152 lb 1.6 oz)                                                                                                                                                  Insurance Information HMO:     PPO:      PCP:      IPA:      80/20:      OTHER:   PRIMARY:  Medicare A and B      Policy#: [id number]      Subscriber:  self Benefits:  Phone #:      Name:   Eff. Date:  Part A 11/25/00; Part B 07/26/00     Deduct:  $1260      Out of Pocket Max:  no     Life Max:  no CIR:  100%      SNF:  100% firs 20 days Outpatient:  80%     Co-Pay:  20% Home Health:  100% per Medicare      Co-Pay:   DME:  80%      Co-Pay:  20% Providers:  Pt. choice SECONDARY:  Medicaid [name] [name]      Policy#:  [id number]      Subscriber:  self     Emergency Contact Information Contact Information     Name  Relation  Home  Work  Mobile     [place]  Daughter  [phone]         [name] [name]  [phone]  [phone]         Current Medical History  Patient Admitting Diagnosis: left thalamus, PLIC infarct   History of Present Illness: [name] [name] is a 80 y.o. right-handed female limited English speaking who was visiting family in [place] and she experienced acute onset right-sided weakness with history of thrombocytopenia, hypertension and newly diagnosed diabetes mellitus. Patient independent prior to admission. She was transferred to [facility] at family's request.    MRI of the brain showed acute lacunar infarct in the lateral left thalamus near the posterior limb of the left internal capsule. MRA of the head showed left PCA to be occluded. Echocardiogram with ejection fraction of 65% grade 1 diastolic dysfunction. Carotid Dopplers with no ICA stenosis. Patient did not receive TPA due to low  platelets.  Neurology services consulted placed on aspirin and Aggrenox for CVA prophylaxis and discontinue aspirin once Aggrenox increased to twice a day after 14 days. Complaints of left leg pain denying any recent trauma with x-ray series of left lower extremity negative for fracture. BLE dopplers negative also.   Tolerating a regular consistency diet.   Aspirin 81 mg orally every day  prior to admission, now on aspirin 325 mg orally every day. After discussion with husband, will change aspirin to dipyridamole SR 250 mg/aspirin 25 mg orally twice a day for secondary stroke prevention. To prevent headache, most common side effect of Aggrenox, will start Aggrenox q hs x 2 weeks then increase Aggrenox to bid. Until then, aspirin 81 mg q am x 2 weeks, then discontinue. May take Tylenol 650 mg 1 hr prior to Aggrenox for the first week, then discontinue.     Total: 0 NIH   Past Medical History  Past Medical History   Diagnosis  Date   .  Hypertension     .  High cholesterol     .  DVT (deep venous thrombosis)     .  Pneumonia     .  Anxiety     .  Essential tremor  10/27/2013   .  Obesity, unspecified  10/27/2013   .  Newly diagnosed diabetes  10/27/2013   .  Unspecified hereditary and idiopathic peripheral neuropathy  10/27/2013      Family History  family history includes Coronary artery disease in her other.   Prior Rehab/Hospitalizations: no IP Rehab history; OP PT for foot fracture in the past   Current Medications  Current facility-administered medications:acetaminophen (TYLENOL) tablet 650 mg, 650 mg, Oral, QHS, Layne Benton, NP, 650 mg at 07/20/14 2206;  ALPRAZolam Prudy Feeler) tablet 0.25 mg, 0.25 mg, Oral, BID, Jared M Gardner, DO, 0.25 mg at 07/21/14 1034;  aspirin EC tablet 81 mg, 81 mg, Oral, Daily, Layne Benton, NP, 81 mg at 07/21/14 1034;  atorvastatin (LIPITOR) tablet 20 mg, 20 mg, Oral, q1800, Hillary Bow, DO, 20 mg at 07/20/14 1735 dipyridamole-aspirin (AGGRENOX) 200-25  MG per 12 hr capsule 1 capsule, 1 capsule, Oral, QHS, Layne Benton, NP, 1 capsule at 07/20/14 2206;  [START ON 08/03/2014] dipyridamole-aspirin (AGGRENOX) 200-25 MG per 12 hr capsule 1 capsule, 1 capsule, Oral, BID, Layne Benton, NP;  docusate sodium (COLACE) capsule 100 mg, 100 mg, Oral, BID, Jared M Gardner, DO, 100 mg at 07/21/14 1034 gabapentin (NEURONTIN) capsule 300 mg, 300 mg, Oral, QHS, Jared M Gardner, DO, 300 mg at 07/20/14 2206;  ibuprofen (ADVIL,MOTRIN) tablet 400 mg, 400 mg, Oral, Q6H PRN, Ramiro Harvest V, MD, 400 mg at 07/21/14 1034;  insulin aspart (novoLOG) injection 0-5 Units, 0-5 Units, Subcutaneous, QHS, David Tat, MD;  insulin aspart (novoLOG) injection 0-9 Units, 0-9 Units, Subcutaneous, TID WC, Catarina Hartshorn, MD, 1 Units at 07/20/14 1735 polyethylene glycol (MIRALAX / GLYCOLAX) packet 17 g, 17 g, Oral, BID, Rodolph Bong, MD, 17 g at 07/21/14 1034;  primidone (MYSOLINE) tablet 250 mg, 250 mg, Oral, BID WC, Hillary Bow, DO, 250 mg at 07/21/14 1191;  traMADol (ULTRAM) tablet 50 mg, 50 mg, Oral, QHS, Jared M Gardner, DO, 50 mg at 07/20/14 2205   Patients Current Diet: heart healthy/carb modified   Precautions / Restrictions Precautions Precautions: Fall Precaution Comments: pt very unsteady on her feet.   Restrictions Weight Bearing Restrictions: No    Prior Activity Level Community (5-7x/wk): Pt. goes out for dinner, out to the mall and [employer] with her son 3-4 times per week according to the son.   Home Assistive Devices / Equipment Home Assistive Devices/Equipment: None   Prior Functional Level Prior Function Level of Independence: Independent Comments: son reports pt recently traveled > 15 hours in airplane traveling `   Current Functional Level Cognition   Overall Cognitive Status:  Within Functional Limits for tasks assessed Orientation Level: Oriented X4     Extremity Assessment (includes Sensation/Coordination)          ADLs   Overall ADL's :  Needs assistance/impaired Grooming: Min guard;Wash/dry face;Sitting Lower Body Bathing: Maximal assistance;Sit to/from stand Toilet Transfer: Moderate assistance Toileting- Clothing Manipulation and Hygiene: Maximal assistance Functional mobility during ADLs: +2 for physical assistance;Moderate assistance General ADL Comments: Pt required (A) to ambulate ~8-10 ft with hand held (A). pt demonstrates Lt LE deficits c/o pain. Pt limp and narrowed base of support. pt with scissored gait      Mobility   Overal bed mobility: Needs Assistance Bed Mobility: Supine to Sit Supine to sit: Min assist General bed mobility comments: Min assist to support trunk to get to sitting EOB.  Pt relying heavily on upper extremities to support her on the bed rail as she moves to sitting EOB.      Transfers   Overall transfer level: Needs assistance Equipment used: Rolling walker (2 wheeled) Transfers: Sit to/from Stand Sit to Stand: Min assist General transfer comment: Min assist from elevated bed and toilet.  Pt is so short that she is practically standing once she gets her feet on the ground.  Pt relying, again, heavily on her arms for transitions to sit and stand.      Ambulation / Gait / Stairs / Wheelchair Mobility   Ambulation/Gait Ambulation/Gait assistance: Mod assist Ambulation Distance (Feet): 20 Feet Assistive device: Rolling walker (2 wheeled) Gait Pattern/deviations: Step-through pattern;Antalgic Gait velocity: decreased Gait velocity interpretation: Below normal speed for age/gender General Gait Details: Pt with very antalgic gait pattern favoring her left leg today.  She has improved gait pattern with increased gait distance (as if she is working out some stiffness), but the antalgic pattern never really goes away.  Pt needs max verbal, and tactile cues on how to safely use RW.  Mod assist to support trunk and help steer RW during gait.      Posture / Balance       Special needs/care  consideration      Bowel mgmt: continent Bladder mgmt: continent with use of BSC Diabetic mgmt Hgb A1 C 6 DM type 2      Previous Home Environment Living Arrangements: Children;Other relatives  Lives With: Family Available Help at Discharge: Available 24 hours/day;Other (Comment) (pt's daughter to come from [place] next week) Type of Home: House Home Layout: One level Home [name]: Stairs to enter Entrance Stairs-Rails: None Entrance Stairs-Number of Steps: 2 Bathroom Shower/Tub: Engineer, manufacturing systems: Standard Bathroom Accessibility: Yes How Accessible: Accessible via walker Home Care Services: No Additional Comments: Pt does not speak English well.  No family present to confirm living situation.    Discharge Living Setting Plans for Discharge Living Setting: House;Lives with (comment) (son) Type of Home at Discharge: House Discharge Home Layout: One level Discharge Home [name]: Stairs to enter Entrance Stairs-Rails: None Entrance Stairs-Number of Steps: 2 Discharge Bathroom Shower/Tub: Tub/shower unit Discharge Bathroom Toilet: Standard Discharge Bathroom Accessibility: Yes How Accessible: Accessible via walker Does the patient have any problems obtaining your medications?: No   Social/Family/Support Systems Patient Roles: Partner Anticipated Caregiver: son, [name] [name] (teaches intermittently at [employer]); daughter who is coming from [place] next week to stay with pt. as long as need, [name] [name] Anticipated Caregiver's Contact Information: [name] [name] [phone] Ability/Limitations of Caregiver: son teaches classes at [employer] intermittently, daughter wil be available 24 hours when she arrives next week from [place] Caregiver  Availability: 24/7 Discharge Plan Discussed with Primary Caregiver: Yes Is Caregiver In Agreement with Plan?: Yes Does Caregiver/Family have Issues with Lodging/Transportation while Pt is in Rehab?: No   Goals/Additional  Needs Patient/Family Goal for Rehab: mod (I) for PT and OT Expected length of stay: 10-12 days Cultural Considerations: Muslim, does not eat pork Equipment Needs: TBD Special Service Needs: Pt. speaks little [place], needs interpretive services; speaks [name] .    Pt/Family Agrees to Admission and willing to participate: Yes Program Orientation Provided & Reviewed with Pt/Caregiver Including Roles  & Responsibilities: Yes   Decrease burden of Care through IP rehab admission: no     Possible need for SNF placement upon discharge:  Not anticipated   Patient Condition: This patient's medical and functional status has changed since the consult dated: 07/19/2014 in which the Rehabilitation Physician determined and documented that the patient's condition is appropriate for intensive rehabilitative care in an inpatient rehabilitation facility. See "History of Present Illness" (above) for medical update. Functional changes are: overall min to mod assist. Patient's medical and functional status update has been discussed with the Rehabilitation physician and patient remains appropriate for inpatient rehabilitation. Will admit to inpatient rehab today.   Preadmission Screen Completed By:  [name], 07/21/2014 11:28 AM ______________________________________________________________________    Discussed status with Dr. Wynn Banker on 07/21/2014 at  1128 and received telephone approval for admission today.   Admission Coordinator:  [name], time 4540 Date 07/21/2014          Cosigned by: Erick Colace, MD    [07/21/2014 11:31 AM]  Revision History...     Date/Time User Action   07/21/2014 11:31 AM Erick Colace, MD Cosign   07/21/2014 11:29 AM [name], RN Sign   07/20/2014 4:07 PM Weldon Picking [facility]  View Details Report

## 2014-07-21 NOTE — Progress Notes (Signed)
Discussed with Dr. Janee Morn. I will arrange admission to inpt rehab for today. I have left a message for pt's son to contact me to admit. I will arrange for interpreter for evaluations for inpt rehab. [phone]

## 2014-07-21 NOTE — Progress Notes (Signed)
Physical Therapy Treatment Patient Details Name: [name] [name] MRN: [id number] DOB: [birth year] Today's Date: 07/21/2014    History of Present Illness 80 y.o. female admitted to [facility] on 07/18/14 with R sided weakness, facial droop.  She was transported from [place] to [facility] per pt and family request.  Stroke workup in progress.  MRI revealed, "Acute lacunar infarct (14 mm) in the lateral left thalamus near the posterior limb of the left internal capsule.The left PCA is occluded."  Pt with significant PMHx of HTN, DVT, anxiety, essential tremor, DM, peripheral neuropathy, and foot surgery.     PT Comments    Pt seems less painful in her left knee during gait today.  Still needs manual cues and assist to support trunk and use RW correctly. She continues to be appropriate for CIR level therapies before going home.  PT will follow acutely.    Follow Up Recommendations  CIR     Equipment Recommendations  Rolling walker with 5" wheels    Recommendations for Other Services   NA     Precautions / Restrictions Precautions Precautions: Fall Precaution Comments: pt very unsteady on her feet.     Mobility  Bed Mobility Overal bed mobility: Needs Assistance Bed Mobility: Supine to Sit     Supine to sit: Min assist     General bed mobility comments: Min assist to support trunk to get to sitting.  Pt pulling with bil upper extremities on the bed rail.   Transfers Overall transfer level: Needs assistance Equipment used: Rolling walker (2 wheeled) Transfers: Sit to/from Stand Sit to Stand: Min assist         General transfer comment: Min assist from both bed and toilet, which due to her short stature would both be considered elevated surfaces.  Pt relying heavily on hands during these transitions.   Ambulation/Gait Ambulation/Gait assistance: Mod assist Ambulation Distance (Feet): 25 Feet Assistive device: Rolling walker (2 wheeled) Gait Pattern/deviations: Step-to  pattern;Antalgic;Trunk flexed Gait velocity: decreased Gait velocity interpretation: Below normal speed for age/gender General Gait Details: Pt continues to have an antalgic gait pattern, but it is better today than yesterday.  Her left knee is swollen on the medial joint line, but is not warm.  Support needed at trunk.  Verbal and manual cues to stay inside of the RW and to stand up tall.     Modified Rankin (Stroke Patients Only) Pre-Morbid Rankin Score:  (unknown.) Modified Rankin: Moderately severe disability     Balance Overall balance assessment: Needs assistance Sitting-balance support: Feet supported;Bilateral upper extremity supported Sitting balance-Leahy Scale: Fair     Standing balance support: Bilateral upper extremity supported Standing balance-Leahy Scale: Poor                      Cognition Arousal/Alertness: Awake/alert Behavior During Therapy: WFL for tasks assessed/performed Overall Cognitive Status: Within Functional Limits for tasks assessed                      Exercises General Exercises - Lower Extremity Ankle Circles/Pumps: AROM;Both;10 reps;Seated Long Arc Quad: AROM;Both;10 reps;Seated Hip Flexion/Marching: AROM;Both;10 reps;Seated        Pertinent Vitals/Pain Pain Assessment: Faces Faces Pain Scale: Hurts little more Pain Location: left knee, painful in WB on left knee.  Pain Descriptors / Indicators: Sharp Pain Intervention(s): Limited activity within patient's tolerance;Monitored during session;Repositioned           PT Goals (current goals can now be found  in the care plan section) Acute Rehab PT Goals Patient Stated Goal: none stated Progress towards PT goals: Progressing toward goals       PT Plan Current plan remains appropriate       End of Session Equipment Utilized During Treatment: Gait belt Activity Tolerance: Patient limited by pain Patient left: in chair;with call bell/phone within reach     Time:  3818-2993 PT Time Calculation (min): 16 min  Charges:  $Gait Training: 8-22 mins            [name] [name] B. [name] [name], PT, DPT [phone]   07/21/2014, 4:15 PM

## 2014-07-21 NOTE — H&P (Signed)
Physical Medicine and Rehabilitation Admission H&P  No chief complaint on file.  :  Chief complaint: Weakness  HPI: [name] [name] is a 80 y.o. right-handed female ([name]) limited English speaking who was visiting family in [place] and she experienced acute onset right-sided weakness with history of thrombocytopenia, hypertension and newly diagnosed diabetes mellitus. Patient independent prior to admission. She was transferred to [facility] at family's request. MRI of the brain showed acute lacunar infarct in the lateral left thalamus near the posterior limb of the left internal capsule. MRA of the head showed left PCA to be occluded. Echocardiogram with ejection fraction of 67% grade 1 diastolic dysfunction. Carotid Dopplers with no ICA stenosis. Patient did not receive TPA. Neurology services consulted placed on aspirin and Aggrenox for CVA prophylaxis and discontinue aspirin once Aggrenox increased to twice a day after 14 days. Complaints of left leg pain denying any recent trauma with x-ray series of left lower extremity negative for fracture.  Tolerating a regular consistency diet. Physical therapy evaluation completed 07/19/2014 with recommendations of physical medicine rehabilitation consult. Patient was admitted for comprehensive rehabilitation program    ROS Review of Systems  Gastrointestinal: Positive for constipation.  Neurological:  Essential tremor  Psychiatric/Behavioral:  Anxiety  All other systems reviewed and are negative  Past Medical History   Diagnosis  Date   .  Hypertension    .  High cholesterol    .  DVT (deep venous thrombosis)    .  Pneumonia    .  Anxiety    .  Essential tremor  10/27/2013   .  Obesity, unspecified  10/27/2013   .  Newly diagnosed diabetes  10/27/2013   .  Unspecified hereditary and idiopathic peripheral neuropathy  10/27/2013    Past Surgical History   Procedure  Laterality  Date   .  Foot surgery     .  Cataract  extraction  Bilateral     Family History   Problem  Relation  Age of Onset   .  Coronary artery disease  Other     Social History: reports that she has never smoked. She does not have any smokeless tobacco history on file. She reports that she does not drink alcohol or use illicit drugs.  Allergies: No Known Allergies  Medications Prior to Admission   Medication  Sig  Dispense  Refill   .  ALPRAZolam (XANAX) 0.25 MG tablet  Take 0.25 mg by mouth 2 (two) times daily.     [name]  aspirin 81 MG tablet  Take 81 mg by mouth daily.     [name]  atenolol (TENORMIN) 50 MG tablet  Take 25 mg by mouth daily.     [name]  atorvastatin (LIPITOR) 20 MG tablet  Take 20 mg by mouth daily.     [name]  diltiazem (CARDIZEM) 30 MG tablet  Take 15 mg by mouth every morning.     .  docusate sodium (COLACE) 100 MG capsule  Take 1 capsule (100 mg total) by mouth 2 (two) times daily.  10 capsule  0   .  furosemide (LASIX) 20 MG tablet  Take 20 mg by mouth daily.     [name]  gabapentin (NEURONTIN) 100 MG capsule  Take 100 mg by mouth 2 (two) times daily.     .  metFORMIN (GLUCOPHAGE) 500 MG tablet  Take 250 mg by mouth daily with breakfast.     .  potassium chloride (K-DUR,KLOR-CON) 10 MEQ tablet  Take 10 mEq by mouth daily.     .  primidone (MYSOLINE) 250 MG tablet  Take 1 tablet (250 mg total) by mouth 2 (two) times daily.  180 tablet  3   .  [DISCONTINUED] gabapentin (NEURONTIN) 100 MG capsule  Take 3 capsules (300 mg total) by mouth at bedtime.  270 capsule  3   .  traMADol (ULTRAM) 50 MG tablet  Take 50 mg by mouth at bedtime.     .  [DISCONTINUED] sulfamethoxazole-trimethoprim (SEPTRA DS) 800-160 MG per tablet  Take 1 tablet by mouth 2 (two) times daily.  6 tablet  0    Home:  Home Living  Family/patient expects to be discharged to:: Private residence  Living Arrangements: Children;Other relatives  Additional Comments: Pt does not speak English well. No family present to confirm living situation.  Functional History:  Prior  Function  Comments: No family available to report.  Functional Status:  Mobility:  Bed Mobility  Overal bed mobility: Needs Assistance  Bed Mobility: Supine to Sit  Supine to sit: Min assist  General bed mobility comments: Min assist to support her trunk when transitioning to sitting. Pt is using her bil arms to pull up against the bed rail on her bed for the transitio.  Transfers  Overall transfer level: Needs assistance  Equipment used: 1 person hand held assist;2 person hand held assist  Transfers: Sit to/from Stand  Sit to Stand: Min assist  General transfer comment: Min assist to stand from both the bed and the toilet. Assist needed to help pt power up over her legs and for balance as pt has a tendancy for posterior LOB.  Ambulation/Gait  Ambulation/Gait assistance: +2 physical assistance;Mod assist  Ambulation Distance (Feet): 15 Feet  Assistive device: 2 person hand held assist  Gait Pattern/deviations: Decreased step length - right;Decreased stance time - right  Gait velocity: decreased  General Gait Details: Pt with buckling when WB on right leg during gait requiring two person mod assist for gait to the bathroom and back to her chair. Pt reaching, when able, with bil upper extremities to support surfaces for balance and stability.   ADL:   Cognition:  Cognition  Overall Cognitive Status: No family/caregiver present to determine baseline cognitive functioning  Orientation Level: Oriented X4;Other (comment) (Pt doesn't speak English; answered questions appropriately)  Cognition  Arousal/Alertness: Awake/alert  Behavior During Therapy: WFL for tasks assessed/performed  Overall Cognitive Status: No family/caregiver present to determine baseline cognitive functioning  Physical Exam:  Blood pressure 112/36, pulse 71, temperature 97.9 F (36.6 C), temperature source Oral, resp. rate 16, height 4' 9"  (1.448 m), weight 68.992 kg (152 lb 1.6 oz), SpO2 96.00%.  Physical Exam   Constitutional: She appears well-developed.  HENT:  Head: Normocephalic.  Eyes: EOM are normal.  Neck: Normal range of motion. Neck supple. No thyromegaly present.  Cardiovascular: Normal rate and regular rhythm.  Respiratory: Effort normal and breath sounds normal. No respiratory distress.  GI: Soft. Bowel sounds are normal. She exhibits no distension.  Neurological: She is alert.  Patient limited English speaking. She does make good eye contact with examiner. She was able to communicate her name, age and follows simple commands. Mild right upper extremity weakness---4/5 prox to distal. RLE: HF 3+/5, KE 3+/5. ADF/APF 3+/5. Sensory testing limited by communication. Normal strength on the left Musculoskeletal-left groin pain with range of motion, decreased left hip range of motion with flexion abduction and adduction Skin: Skin is warm and dry  Results  for orders placed during the hospital encounter of 07/18/14 (from the past 48 hour(s))   GLUCOSE, CAPILLARY Status: None    Collection Time    07/18/14 12:12 PM   Result  Value  Ref Range    Glucose-Capillary  99  70 - 99 mg/dL   VITAMIN B12 Status: Abnormal    Collection Time    07/18/14 1:37 PM   Result  Value  Ref Range    Vitamin B-12  1082 (*)  211 - 911 pg/mL    Comment:  Performed at [facility] RBC Status: Abnormal    Collection Time    07/18/14 1:37 PM   Result  Value  Ref Range    RBC Folate  716 (*)  >280 ng/mL    Comment:  Reference range not established for pediatric patients.     Performed at [facility] Status: None    Collection Time    07/18/14 1:37 PM   Result  Value  Ref Range    Hepatitis B Surface Ag  NEGATIVE  NEGATIVE    Comment:  Performed at [facility] Status: None    Collection Time    07/18/14 1:37 PM   Result  Value  Ref Range    HCV Ab  NEGATIVE  NEGATIVE    Comment:  Performed at [facility], CAPILLARY Status: Abnormal    Collection Time    07/18/14 5:10 PM   Result  Value  Ref Range    Glucose-Capillary  105 (*)  70 - 99 mg/dL   GLUCOSE, CAPILLARY Status: None    Collection Time    07/18/14 7:55 PM   Result  Value  Ref Range    Glucose-Capillary  96  70 - 99 mg/dL   BASIC METABOLIC PANEL Status: Abnormal    Collection Time    07/19/14 5:45 AM   Result  Value  Ref Range    Sodium  140  137 - 147 mEq/L    Potassium  3.8  3.7 - 5.3 mEq/L    Chloride  104  96 - 112 mEq/L    CO2  25  19 - 32 mEq/L    Glucose, Bld  86  70 - 99 mg/dL    BUN  14  6 - 23 mg/dL    Creatinine, Ser  0.72  0.50 - 1.10 mg/dL    Calcium  8.6  8.4 - 10.5 mg/dL    GFR calc non Af Amer  78 (*)  >90 mL/min    GFR calc Af Amer  >90  >90 mL/min    Comment:  (NOTE)     The eGFR has been calculated using the CKD EPI equation.     This calculation has not been validated in all clinical situations.     eGFR's persistently <90 mL/min signify possible Chronic Kidney     Disease.    Anion gap  11  5 - 15   CBC Status: Abnormal    Collection Time    07/19/14 5:45 AM   Result  Value  Ref Range    WBC  4.1  4.0 - 10.5 K/uL    Comment:  WHITE COUNT CONFIRMED ON SMEAR    RBC  4.63  3.87 - 5.11 MIL/uL    Hemoglobin  14.0  12.0 - 15.0 g/dL    HCT  41.8  36.0 - 46.0 %    MCV  90.3  78.0 - 100.0 fL    MCH  30.2  26.0 - 34.0 pg    MCHC  33.5  30.0 - 36.0 g/dL    RDW  13.8  11.5 - 15.5 %    Platelets  98 (*)  150 - 400 K/uL    Comment:  PLATELET COUNT CONFIRMED BY SMEAR   GLUCOSE, CAPILLARY Status: None    Collection Time    07/19/14 6:42 AM   Result  Value  Ref Range    Glucose-Capillary  90  70 - 99 mg/dL    Comment 1  Notify RN     Comment 2  Documented in Chart    GLUCOSE, CAPILLARY Status: Abnormal    Collection Time    07/19/14 11:27 AM   Result  Value  Ref Range    Glucose-Capillary  106 (*)  70 - 99 mg/dL   GLUCOSE, CAPILLARY Status: None    Collection Time    07/19/14 4:38 PM    Result  Value  Ref Range    Glucose-Capillary  90  70 - 99 mg/dL   GLUCOSE, CAPILLARY Status: None    Collection Time    07/19/14 10:44 PM   Result  Value  Ref Range    Glucose-Capillary  86  70 - 99 mg/dL   GLUCOSE, CAPILLARY Status: None    Collection Time    07/20/14 6:43 AM   Result  Value  Ref Range    Glucose-Capillary  93  70 - 99 mg/dL    Dg Chest 2 View  07/18/2014 CLINICAL DATA: Dyspnea. EXAM: CHEST 2 VIEW COMPARISON: March 17, 2013. FINDINGS: Stable cardiomediastinal silhouette. No pneumothorax or pleural effusion is noted. Degenerative disc disease is noted in the lower thoracic spine. No acute pulmonary disease is noted. IMPRESSION: No acute cardiopulmonary abnormality seen. Electronically Signed By: Sabino Dick M.D. On: 07/18/2014 14:10  Mr Brain Wo Contrast  07/19/2014 ADDENDUM REPORT: 07/19/2014 13:19 ADDENDUM: Study discussed by telephone with Dr. Shanon Brow Tat on 07/19/2014 at 1254 hrs. Electronically Signed By: Lars Pinks M.D. On: 07/19/2014 13:19  07/19/2014 CLINICAL DATA: 80 year old female with acute onset right side weakness facial droop and slurred speech. Code stroke. Improved symptoms, patient was not a candidate for tPA. Initial encounter. EXAM: MRI HEAD WITHOUT CONTRAST MRA HEAD WITHOUT CONTRAST TECHNIQUE: Multiplanar, multiecho pulse sequences of the brain and surrounding structures were obtained without intravenous contrast. Angiographic images of the head were obtained using MRA technique without contrast. COMPARISON: Cervical spine MRI 03/16/2013. FINDINGS: MRI HEAD FINDINGS Oval 14 mm area of restricted diffusion in the lateral left thalamus abutting the posterior limb of the left internal capsule. T2 and FLAIR hyperintensity. No hemorrhage or mass effect. No other restricted diffusion identified. Major intracranial vascular flow voids are preserved. No midline shift, mass effect, evidence of mass lesion, ventriculomegaly, extra-axial collection or acute intracranial  hemorrhage. Cervicomedullary junction and pituitary are within normal limits. Negative for age visualized cervical spine. Outside of the acute findings, normal for age gray and white matter signal throughout the brain. Visible internal auditory structures appear normal. Mastoids are clear. Minor paranasal sinus mucosal thickening. Postoperative changes to the globes. Visualized scalp soft tissues are within normal limits. Normal bone marrow signal. MRA HEAD FINDINGS Antegrade flow in the posterior circulation with codominant distal vertebral arteries. Normal vertebrobasilar junction. Normal right PICA origin. Left AICA may be dominant. Mildly tortuous basilar artery, no stenosis. SCA and PCA origins are  within normal limits. The left PCAs occluded in the P2 segment, with no distal left PCA flow (series 605, image 18). Posterior communicating arteries are diminutive or absent. Right PCA branches are within normal limits. Antegrade flow in both ICA siphons. Mild tortuosity but no siphon stenosis identified. Ophthalmic artery origins are within normal limits. Carotid termini are patent. MCA and ACA origins are within normal limits. Anterior communicating artery is normal. Median artery of the corpus callosum is present. Visualized bilateral ACA branches are within normal limits. Visualized bilateral MCA branches are within normal limits. IMPRESSION: 1. Acute lacunar infarct (14 mm) in the lateral left thalamus near the posterior limb of the left internal capsule. No mass effect or hemorrhage. 2. The left PCA is occluded in the P2 segment, near the expected location of the left thalamostriate artery origins. 3. Otherwise negative intracranial MRA, and otherwise normal for age non contrast MRI appearance of the brain. Electronically Signed: By: Lars Pinks M.D. On: 07/19/2014 12:22  Mr Jodene Nam Head/brain Wo Cm  07/19/2014 ADDENDUM REPORT: 07/19/2014 13:19 ADDENDUM: Study discussed by telephone with Dr. Shanon Brow Tat on 07/19/2014  at 1254 hrs. Electronically Signed By: Lars Pinks M.D. On: 07/19/2014 13:19  07/19/2014 CLINICAL DATA: 80 year old female with acute onset right side weakness facial droop and slurred speech. Code stroke. Improved symptoms, patient was not a candidate for tPA. Initial encounter. EXAM: MRI HEAD WITHOUT CONTRAST MRA HEAD WITHOUT CONTRAST TECHNIQUE: Multiplanar, multiecho pulse sequences of the brain and surrounding structures were obtained without intravenous contrast. Angiographic images of the head were obtained using MRA technique without contrast. COMPARISON: Cervical spine MRI 03/16/2013. FINDINGS: MRI HEAD FINDINGS Oval 14 mm area of restricted diffusion in the lateral left thalamus abutting the posterior limb of the left internal capsule. T2 and FLAIR hyperintensity. No hemorrhage or mass effect. No other restricted diffusion identified. Major intracranial vascular flow voids are preserved. No midline shift, mass effect, evidence of mass lesion, ventriculomegaly, extra-axial collection or acute intracranial hemorrhage. Cervicomedullary junction and pituitary are within normal limits. Negative for age visualized cervical spine. Outside of the acute findings, normal for age gray and white matter signal throughout the brain. Visible internal auditory structures appear normal. Mastoids are clear. Minor paranasal sinus mucosal thickening. Postoperative changes to the globes. Visualized scalp soft tissues are within normal limits. Normal bone marrow signal. MRA HEAD FINDINGS Antegrade flow in the posterior circulation with codominant distal vertebral arteries. Normal vertebrobasilar junction. Normal right PICA origin. Left AICA may be dominant. Mildly tortuous basilar artery, no stenosis. SCA and PCA origins are within normal limits. The left PCAs occluded in the P2 segment, with no distal left PCA flow (series 605, image 18). Posterior communicating arteries are diminutive or absent. Right PCA branches are within  normal limits. Antegrade flow in both ICA siphons. Mild tortuosity but no siphon stenosis identified. Ophthalmic artery origins are within normal limits. Carotid termini are patent. MCA and ACA origins are within normal limits. Anterior communicating artery is normal. Median artery of the corpus callosum is present. Visualized bilateral ACA branches are within normal limits. Visualized bilateral MCA branches are within normal limits. IMPRESSION: 1. Acute lacunar infarct (14 mm) in the lateral left thalamus near the posterior limb of the left internal capsule. No mass effect or hemorrhage. 2. The left PCA is occluded in the P2 segment, near the expected location of the left thalamostriate artery origins. 3. Otherwise negative intracranial MRA, and otherwise normal for age non contrast MRI appearance of the brain. Electronically Signed: By: Truman Hayward  [place] Crane M.D. On: 07/19/2014 12:22   Medical Problem List and Plan:  1. Functional deficits secondary to left thalamus, PLIC. infarct  2. DVT Prophylaxis/Anticoagulation: SCDs. Monitor for any signs of DVT. Venous Doppler studies 07/20/2014 negative  3. Pain Management: Neurontin 300 mg each bedtime, Ultram 50 mg each bedtime . Monitor with increased mobility  4. Mood/anxiety: Xanax 0.25 mg twice a day. Provide emotional support  5. Neuropsych: This patient is capable of making decisions on her own behalf (with trranslation).  6. Skin/Wound Care: Routine skin checks  7. History of thrombocytopenia. Latest platelet count 98,000. Monitor for any bleeding episodes  8. Diabetes mellitus with peripheral neuropathy. Hemoglobin A1c 6.0. Check blood sugars a.c. and at bedtime. Patient on Glucophage 250 mg daily prior to admission. Resume as tolerated  9. Hypertension. No current antihypertensive medication. Patient on atenolol 25 mg daily and Cardizem 15 mg daily, Lasix 20 mg daily prior to admission. Allowing permissive hypertension. Resume as needed monitor with increased  mobility  10. Hyperlipidemia. Lipitor  11. Benign essential tremor. Continue Mysoline 250 mg twice a day as prior to admission  Post Admission Physician Evaluation:  1. Functional deficits secondary to Left thalamic and PLIC infarcts. 2. Patient is admitted to receive collaborative, interdisciplinary care between the physiatrist, rehab nursing staff, and therapy team. 3. Patient's level of medical complexity and substantial therapy needs in context of that medical necessity cannot be provided at a lesser intensity of care such as a SNF. 4. Patient has experienced substantial functional loss from his/her baseline which was documented above under the "Functional History" and "Functional Status" headings. Judging by the patient's diagnosis, physical exam, and functional history, the patient has potential for functional progress which will result in measurable gains while on inpatient rehab. These gains will be of substantial and practical use upon discharge in facilitating mobility and self-care at the household level. 5. Physiatrist will provide 24 hour management of medical needs as well as oversight of the therapy plan/treatment and provide guidance as appropriate regarding the interaction of the two. 6. 24 hour rehab nursing will assist with bladder management, bowel management, safety, skin/wound care, disease management, medication administration, pain management and patient education and help integrate therapy concepts, techniques,education, etc. 7. PT will assess and treat for/with: pre gait, gait training, endurance , safety, equipment, neuromuscular re education, balance. Goals are: Sup. 8. OT will assess and treat for/with: ADLs, Cognitive perceptual skills, Neuromuscular re education, safety, endurance, equipment. Goals are: Sup. 9. SLP will assess and treat for/with: NA. Goals are: NA. 10. Case Management and Social Worker will assess and treat for psychological issues and discharge  planning. 11. Team conference will be held weekly to assess progress toward goals and to determine barriers to discharge. 12. Patient will receive at least 3 hours of therapy per day at least 5 days per week. 13. ELOS: 7-10d  14. Prognosis: excellent  Charlett Blake M.D. [facility] FAAPM&R (Sports Med, Neuromuscular Med) Diplomate Am Board of Electrodiagnostic Med   07/20/2014

## 2014-07-21 NOTE — Progress Notes (Signed)
Patient report given to CIR.  IV removed and discharge paperwork done.  Lance Bosch, RN

## 2014-07-22 ENCOUNTER — Inpatient Hospital Stay (HOSPITAL_COMMUNITY): Payer: Medicare Other | Admitting: Occupational Therapy

## 2014-07-22 ENCOUNTER — Inpatient Hospital Stay (HOSPITAL_COMMUNITY): Payer: Medicare Other | Admitting: Physical Therapy

## 2014-07-22 ENCOUNTER — Inpatient Hospital Stay (HOSPITAL_COMMUNITY): Payer: Medicare Other | Admitting: Speech Pathology

## 2014-07-22 LAB — COMPREHENSIVE METABOLIC PANEL
ALBUMIN: 3.1 g/dL — AB (ref 3.5–5.2)
ALT: 26 U/L (ref 0–35)
ANION GAP: 10 (ref 5–15)
AST: 24 U/L (ref 0–37)
Alkaline Phosphatase: 71 U/L (ref 39–117)
BILIRUBIN TOTAL: 0.3 mg/dL (ref 0.3–1.2)
BUN: 14 mg/dL (ref 6–23)
CHLORIDE: 102 meq/L (ref 96–112)
CO2: 27 mEq/L (ref 19–32)
Calcium: 8.6 mg/dL (ref 8.4–10.5)
Creatinine, Ser: 0.74 mg/dL (ref 0.50–1.10)
GFR calc non Af Amer: 78 mL/min — ABNORMAL LOW (ref >90)
GLUCOSE: 143 mg/dL — AB (ref 70–99)
Potassium: 4.1 mEq/L (ref 3.7–5.3)
Sodium: 139 mEq/L (ref 137–147)
Total Protein: 7 g/dL (ref 6.0–8.3)

## 2014-07-22 LAB — GLUCOSE, CAPILLARY
Glucose-Capillary: 93 mg/dL (ref 70–99)
Glucose-Capillary: 75 mg/dL (ref 70–99)
Glucose-Capillary: 117 mg/dL — ABNORMAL HIGH (ref 70–99)
Glucose-Capillary: 99 mg/dL (ref 70–99)

## 2014-07-22 LAB — CBC WITH DIFFERENTIAL/PLATELET
Basophils Absolute: 0 10*3/uL (ref 0.0–0.1)
Basophils Relative: 0 % (ref 0–1)
Eosinophils Absolute: 0.1 10*3/uL (ref 0.0–0.7)
Eosinophils Relative: 2 % (ref 0–5)
HEMATOCRIT: 44.5 % (ref 36.0–46.0)
HEMOGLOBIN: 14.8 g/dL (ref 12.0–15.0)
Lymphocytes Relative: 32 % (ref 12–46)
Lymphs Abs: 1.6 10*3/uL (ref 0.7–4.0)
MCH: 30.7 pg (ref 26.0–34.0)
MCHC: 33.3 g/dL (ref 30.0–36.0)
MCV: 92.3 fL (ref 78.0–100.0)
MONO ABS: 0.4 10*3/uL (ref 0.1–1.0)
MONOS PCT: 9 % (ref 3–12)
NEUTROS ABS: 2.8 10*3/uL (ref 1.7–7.7)
Neutrophils Relative %: 58 % (ref 43–77)
Platelets: 105 10*3/uL — ABNORMAL LOW (ref 150–400)
RBC: 4.82 MIL/uL (ref 3.87–5.11)
RDW: 13.9 % (ref 11.5–15.5)
WBC: 4.9 10*3/uL (ref 4.0–10.5)

## 2014-07-22 MED ORDER — METFORMIN HCL 500 MG PO TABS: 250.0000 mg | ORAL_TABLET | Freq: Every day | ORAL | Status: DC

## 2014-07-22 MED ORDER — ATENOLOL 25 MG PO TABS
25.0000 mg | ORAL_TABLET | Freq: Every day | ORAL | Status: DC
  Administered 2014-07-22 – 2014-07-25 (×4): 25 mg via ORAL
  Filled 2014-07-22 (×6): qty 1

## 2014-07-22 NOTE — Evaluation (Signed)
Speech Language Pathology Assessment and Plan  Patient Details  Name: [name] [name] MRN: [id number] Date of Birth: 03-27-1930  SLP Diagnosis: Other (comment) (n/a)   Today's Date: 07/22/2014 SLP Individual Time: 4098-1191 SLP Individual Time Calculation (min): 62 min   Problem List:  Patient Active Problem List   Diagnosis Date Noted  . CVA (cerebral infarction) 07/21/2014  . Left leg pain 07/20/2014  . Stroke 07/19/2014  . HTN (hypertension) 07/18/2014  . HLD (hyperlipidemia) 07/18/2014  . Acute ischemic stroke 07/18/2014  . Thrombocytopenia, unspecified 07/18/2014  . Essential tremor 10/27/2013  . Obesity, unspecified 10/27/2013  . DM2 (diabetes mellitus, type 2) 10/27/2013  . Unspecified hereditary and idiopathic peripheral neuropathy 10/27/2013   Past Medical History:  Past Medical History  Diagnosis Date  . Hypertension   . High cholesterol   . DVT (deep venous thrombosis)   . Pneumonia   . Anxiety   . Essential tremor 10/27/2013  . Obesity, unspecified 10/27/2013  . Newly diagnosed diabetes 10/27/2013  . Unspecified hereditary and idiopathic peripheral neuropathy 10/27/2013   Past Surgical History:  Past Surgical History  Procedure Laterality Date  . Foot surgery    . Cataract extraction Bilateral     Assessment / Plan / Recommendation Clinical Impression   [name] [name] is a 80 y.o. right-handed female [name] limited English speaking who was visiting family in [place] and she experienced acute onset right-sided weakness with history of thrombocytopenia, hypertension and newly diagnosed diabetes mellitus. Patient independent prior to admission. She was transferred to [facility] at family's request. MRI of the brain showed acute lacunar infarct in the lateral left thalamus near the posterior limb of the left internal capsule. MRA of the head showed left PCA to be occluded. Echocardiogram with ejection fraction of 65% grade 1  diastolic dysfunction. Carotid Dopplers with no ICA stenosis. Patient did not receive TPA. Neurology services consulted placed on aspirin and Aggrenox for CVA prophylaxis and discontinue aspirin once Aggrenox increased to twice a day after 14 days. Complaints of left leg pain denying any recent trauma with x-ray series of left lower extremity negative for fracture.  Tolerating a regular consistency diet. Physical therapy evaluation completed 07/19/2014 with recommendations of physical medicine rehabilitation consult. Patient was admitted for comprehensive rehabilitation program 07/21/2014.  SLP cognitive-linguistic evaluation completed 07/22/2014 with the following results: Pt presents with minimal-mild cognitive impairments for recall of new information.  Per pt and pt's family, pt has at this point returned to baseline level of cognitive functioning.  Pt was independent for medication management prior to admission and exhibited decreased recall of new medications; therefore, SLP recommended that family assist pt with medication management at discharge and demonstrated effective cuing strategies during a trial medication management task with list of pt's currently prescribed medications and pill box.   Pt's family was agreeable to provide recommended level assistance and will be available to provide 24/7 supervision at discharge if necessary.  No further SLP services are indicated at this time.     Skilled Therapeutic Interventions          Cognitive-linguistic evaluation completed with results and recommendations reviewed with family.     SLP Assessment  Patient does not need any further Speech Lanaguage Pathology Services             Pain Pain Assessment Pain Assessment: No/denies pain Prior Functioning Cognitive/Linguistic Baseline: Baseline deficits Baseline deficit details: mild memory deficits  Type of Home: House  Lives With: Son Available  Help at Discharge: Family;Available 24  hours/day Vocation: Retired    See FIM for current functional status  Recommendations for other services: None  Discharge Criteria: Patient will be discharged from SLP if patient refuses treatment 3 consecutive times without medical reason, if treatment goals not met, if there is a change in medical status, if patient makes no progress towards goals or if patient is discharged from hospital.  The above assessment, treatment plan, treatment alternatives and goals were discussed and mutually agreed upon: by patient and by family  Jackalyn Lombard, M.A. CCC-SLP  [name] [name], [name] 07/22/2014, 3:38 PM

## 2014-07-22 NOTE — Progress Notes (Signed)
Patient information reviewed and entered into eRehab system by Emory Leaver, RN, CRRN, PPS Coordinator.  Information including medical coding and functional independence measure will be reviewed and updated through discharge.     Per nursing patient was given "Data Collection Information Summary for Patients in Inpatient Rehabilitation Facilities with attached "Privacy Act Statement-Health Care Records" upon admission.  

## 2014-07-22 NOTE — Progress Notes (Signed)
Occupational Therapy Assessment and Plan  Patient Details  Name: [name] [name] MRN: [id number] Date of Birth: 1930/06/04  OT Diagnosis: hemiplegia affecting dominant side and muscle weakness (generalized) Rehab Potential: Rehab Potential: Excellent (for stated goals) ELOS: 7-9 days   Today's Date: 07/22/2014 OT Individual Time: 0900-1000       Problem List:  Patient Active Problem List   Diagnosis Date Noted  . CVA (cerebral infarction) 07/21/2014  . Left leg pain 07/20/2014  . Stroke 07/19/2014  . HTN (hypertension) 07/18/2014  . HLD (hyperlipidemia) 07/18/2014  . Acute ischemic stroke 07/18/2014  . Thrombocytopenia, unspecified 07/18/2014  . Essential tremor 10/27/2013  . Obesity, unspecified 10/27/2013  . DM2 (diabetes mellitus, type 2) 10/27/2013  . Unspecified hereditary and idiopathic peripheral neuropathy 10/27/2013    Past Medical History:  Past Medical History  Diagnosis Date  . Hypertension   . High cholesterol   . DVT (deep venous thrombosis)   . Pneumonia   . Anxiety   . Essential tremor 10/27/2013  . Obesity, unspecified 10/27/2013  . Newly diagnosed diabetes 10/27/2013  . Unspecified hereditary and idiopathic peripheral neuropathy 10/27/2013   Past Surgical History:  Past Surgical History  Procedure Laterality Date  . Foot surgery    . Cataract extraction Bilateral     Assessment & Plan Clinical Impression: Patient is a 80 y.o. year old female ([name]) limited English speaking who was visiting family in [place] and she experienced acute onset right-sided weakness with history of thrombocytopenia, hypertension and newly diagnosed diabetes mellitus. Patient independent prior to admission. She was transferred to [facility] at family's request. MRI of the brain showed acute lacunar infarct in the lateral left thalamus near the posterior limb of the left internal capsule. MRA of the head showed left PCA to be occluded.  Echocardiogram with ejection fraction of 39% grade 1 diastolic dysfunction. Complaints of left leg pain denying any recent trauma with x-ray series of left lower extremity negative for fracture.  Patient transferred to CIR on 07/21/2014 .    Patient currently requires min with basic self-care skills secondary to muscle weakness, decreased cardiorespiratoy endurance and decreased standing balance, decreased postural control, hemiplegia and decreased balance strategies.  Prior to hospitalization, patient could complete ADLs with independent .  Patient will benefit from skilled intervention to increase independence with basic self-care skills prior to discharge home with care partner.  Anticipate patient will require 24 hour supervision and possible outpatient OT follow up.      Skilled Therapeutic Intervention OT evaluation initiated and completed. Interpreter present this session. OT educated pt on OT purpose, POC, and goals with pt in agreement. Pt performed bathing seated in chair steady assist for dynamic standing balance while pulling up underwear and skirt. Pt with decreased [place] and strength noted in R UE and hand. OT provided green squeeze block. Pt demonstrated the ability to perform 5 snaps on shirt with increased time.   OT Evaluation Precautions/Restrictions  Precautions Precautions: Fall Restrictions Weight Bearing Restrictions: No General Chart Reviewed: Yes Pain Pain Assessment Pain Assessment: No/denies pain Pain Score: 0-No pain Home Living/Prior Functioning Home Living Family/patient expects to be discharged to:: Private residence Living Arrangements: Children;Other relatives Available Help at Discharge:  (Pt reports no one to provide supervision during day time hours) Type of Home: House Home Access: Stairs to enter Entrance Stairs-Number of Steps: Pt reports 5 Entrance Stairs-Rails: Can reach both Home Layout: One level Additional Comments: Pt speaks limited english.  Interpreter present for  evaluation.   Lives With: Son (lives with son who works during the day) IADL History Homemaking Responsibilities: Yes Meal Prep Responsibility:  (she shares responsibility with son) Development worker, international aid:  (shares responsibility with son) Research scientist (life sciences):  (shares responsibility with son) Vision/Perception  Vision- History Baseline Vision/History: Wears glasses Wears Glasses: Distance only (per pt report) Patient Visual Report: No change from baseline Vision- Assessment Vision Assessment?: No apparent visual deficits  Cognition Overall Cognitive Status: Within Functional Limits for tasks assessed Arousal/Alertness: Awake/alert Orientation Level: Oriented X4 Sensation Sensation Light Touch: Appears Intact Hot/Cold: Appears Intact Coordination Gross Motor Movements are Fluid and Coordinated: Yes Fine Motor Movements are Fluid and Coordinated: No (decreased speed for [facility] tasks with R hand. L hand appearing to have prior hx of tremor.) Motor  Motor Motor: Hemiplegia;Abnormal postural alignment and control Motor - Skilled Clinical Observations: Mild R UE Mobility  Bed Mobility Bed Mobility: Supine to Sit;Sit to Supine Supine to Sit: 5: Supervision Sit to Supine: 5: Supervision  Trunk/Postural Assessment  Cervical Assessment Cervical Assessment: Exceptions to [facility] (head slightly forward) Thoracic Assessment Thoracic Assessment: Exceptions to [facility] Lumbar Assessment Lumbar Assessment: Exceptions to [facility] (kyphotic posture) Postural Control Postural Control: Deficits on evaluation  Balance Dynamic Standing Balance Dynamic Standing - Balance Support: During functional activity;Left upper extremity supported (Min A while standing to pull up pants and underwear) Extremity/Trunk Assessment RUE Assessment RUE Assessment: Exceptions to [facility] RUE Strength RUE Overall Strength: Within Functional Limits for tasks performed RUE Overall Strength Comments:  AROM and strength WFLs. However, R UE strength and grip is 3+/5 and less than non dominant side.  LUE Assessment LUE Assessment: Within Functional Limits  FIM:  FIM - Eating Eating Activity: 6: Assistive device: dentures FIM - Grooming Grooming Steps: Wash, rinse, dry face;Wash, rinse, dry hands;Oral care, brush teeth, clean dentures;Brush, comb hair (standing at sink side) Grooming: 4: Steadying assist  or patient completes 3 of 4 or 4 of 5 steps FIM - Bathing Bathing Steps Patient Completed: Chest;Right Arm;Left Arm;Abdomen;Front perineal area;Buttocks;Right upper leg;Left upper leg;Right lower leg (including foot);Left lower leg (including foot) Bathing: 4: Steadying assist FIM - Upper Body Dressing/Undressing Upper body dressing/undressing steps patient completed: Thread/unthread right sleeve of pullover shirt/dresss;Thread/unthread left sleeve of pullover shirt/dress;Put head through opening of pull over shirt/dress;Pull shirt over trunk;Button/unbutton shirt Upper body dressing/undressing: 5: Set-up assist to: Obtain clothing/put away FIM - Lower Body Dressing/Undressing Lower body dressing/undressing steps patient completed: Thread/unthread right underwear leg;Thread/unthread left underwear leg;Pull underwear up/down;Thread/unthread right pants leg;Thread/unthread left pants leg;Pull pants up/down;Don/Doff right shoe;Don/Doff left shoe Lower body dressing/undressing: 4: Min-Patient completed 75 plus % of tasks FIM - Toileting Toileting: 0: Activity did not occur FIM - Air cabin crew Transfers: 0-Activity did not occur FIM - Camera operator Transfers: 0-Activity did not occur or was simulated   Refer to Care Plan for Long Term Goals  Recommendations for other services: None  Discharge Criteria: Patient will be discharged from OT if patient refuses treatment 3 consecutive times without medical reason, if treatment goals not met, if there is a change in  medical status, if patient makes no progress towards goals or if patient is discharged from hospital.  The above assessment, treatment plan, treatment alternatives and goals were discussed and mutually agreed upon: by patient  Phineas Semen 07/22/2014, 10:55 AM

## 2014-07-22 NOTE — IPOC Note (Signed)
Overall Plan of Care [facility]) Patient Details Name: [name] [name] MRN: [id number] DOB: [birth year]  Admitting Diagnosis: CVA  Hospital Problems: Active Problems:   CVA (cerebral infarction)     Functional Problem List: Nursing Bladder;Bowel;Pain;Safety;Skin Integrity  PT Balance;Endurance;Motor;Pain;Safety;Sensory;Skin Integrity  OT Balance;Endurance;Motor;Safety  SLP    TR         Basic ADL's: OT Grooming;Bathing;Dressing;Toileting     Advanced  ADL's: OT Simple Meal Preparation     Transfers: PT Bed Mobility;Bed to Chair;Car;Furniture  OT Toilet;Tub/Shower     Locomotion: PT Ambulation;Wheelchair Mobility;Stairs     Additional Impairments: OT    SLP        TR      Anticipated Outcomes Item Anticipated Outcome  Self Feeding    Swallowing      Basic self-care  Mod I - supervision  Toileting  Supervision   Bathroom Transfers supervision  Bowel/Bladder  Patient will continue continent of bladder and bowel.  Transfers  mod I  Locomotion  supervision  Communication     Cognition     Pain  pain level <3 on scale 1 to 10  Safety/Judgment  Pt. will be free from falls during her stay in rehab   Therapy Plan: PT Intensity: Minimum of 1-2 x/day ,45 to 90 minutes PT Frequency: 5 out of 7 days PT Duration Estimated Length of Stay: 5-7 days OT Intensity: Minimum of 1-2 x/day, 45 to 90 minutes OT Frequency: 5 out of 7 days OT Duration/Estimated Length of Stay: 7-9 days         Team Interventions: Nursing Interventions Patient/Family Education;Bladder Management;Bowel Management;Pain Management;Skin Care/Wound Management  PT interventions Ambulation/gait training;Balance/vestibular training;Discharge planning;DME/adaptive equipment instruction;Functional mobility training;Neuromuscular re-education;Pain management;Patient/family education;Stair training;Therapeutic Activities;Therapeutic Exercise;UE/LE Strength taining/ROM;UE/LE Coordination  activities;Wheelchair propulsion/positioning  OT Interventions Balance/vestibular training;Discharge planning;Functional mobility training;Pain management;Therapeutic Activities;UE/LE Coordination activities;Therapeutic Exercise;UE/LE Strength taining/ROM;Patient/family education;Self Care/advanced ADL retraining  SLP Interventions    TR Interventions    SW/CM Interventions Discharge Planning;Psychosocial Support;Patient/Family Education    Team Discharge Planning: Destination: PT-Home ,OT- Home , SLP-  Projected Follow-up: PT-Home health PT, OT-  24 hour supervision/assistance, SLP-  Projected Equipment Needs: PT-To be determined, OT- Tub/shower bench, SLP-  Equipment Details: PT- , OT-  Patient/family involved in discharge planning: PT- Patient;Family member/caregiver,  OT-Patient, SLP-Patient;Family member/caregiver  MD ELOS: 7 days Medical Rehab Prognosis:  Excellent Assessment: The patient has been admitted for CIR therapies with the diagnosis of CVA. The team will be addressing functional mobility, strength, stamina, balance, safety, adaptive techniques and equipment, self-care, bowel and bladder mgt, patient and caregiver education, NMR, egosupport, leisure awareness, community reintegration, stroke educatin. Goals have been set at supervision to mod I for most mobility and self-care tasks.    Ranelle Oyster, MD, FAAPMR      See Team Conference Notes for weekly updates to the plan of care

## 2014-07-22 NOTE — Plan of Care (Signed)
Problem: RH KNOWLEDGE DEFICIT Goal: RH STG INCREASE KNOWLEDGE OF DIABETES Patient/family will be able to identify the s/sx of hypo/hyperglycemia, its management and treatment, as well as demonstration of proper blood glucose monitoring and administration techniques.  Goal: RH STG INCREASE KNOWLEDGE OF HYPERTENSION Patient/family will verbalize understanding of the disease process; will be able to identify the s/sx of hypo/hypertension, its management and treatment.[name] KitchenMarland KitchenMarland [name]

## 2014-07-22 NOTE — Care Management Note (Signed)
Inpatient Rehabilitation Center Individual Statement of Services  Patient Name:  [name] [name]  Date:  07/22/2014  Welcome to the Inpatient Rehabilitation Center.  Our goal is to provide you with an individualized program based on your diagnosis and situation, designed to meet your specific needs.  With this comprehensive rehabilitation program, you will be expected to participate in at least 3 hours of rehabilitation therapies Monday-Friday, with modified therapy programming on the weekends.  Your rehabilitation program will include the following services:  Physical Therapy (PT), Occupational Therapy (OT), Speech Therapy (ST), 24 hour per day rehabilitation nursing, Therapeutic Recreaction (TR), Case Management (Social Worker), Rehabilitation Medicine, Nutrition Services and Pharmacy Services  Weekly team conferences will be held on Wednesday to discuss your progress.  Your Social Worker will talk with you frequently to get your input and to update you on team discussions.  Team conferences with you and your family in attendance may also be held.  Expected length of stay: 5-7 days  Overall anticipated outcome: mod/i-supervision level  Depending on your progress and recovery, your program may change. Your Social Worker will coordinate services and will keep you informed of any changes. Your Social Worker's name and contact numbers are listed  below.  The following services may also be recommended but are not provided by the Inpatient Rehabilitation Center:   Home Health Rehabiltiation Services  Outpatient Rehabilitation Services   Arrangements will be made to provide these services after discharge if needed.  Arrangements include referral to agencies that provide these services.  Your insurance has been verified to be:  Medicare & Medicaid Your primary doctor is:  Dorothyann Peng  Pertinent information will be shared with your doctor and your insurance company.  Social Worker:  Dossie Der, SW [phone] or ([phone]  Information discussed with and copy given to patient by: Lucy Chris, 07/22/2014, 1:18 PM

## 2014-07-22 NOTE — Evaluation (Signed)
Physical Therapy Assessment and Plan  Patient Details  Name: [name] [name] MRN: [id number] Date of Birth: 28-Jan-1930  PT Diagnosis: Abnormal posture, Abnormality of gait, Coordination disorder, Hemiplegia dominant, Impaired sensation and Muscle weakness Rehab Potential: Good ELOS: 5-7 days   Today's Date: 07/22/2014 PT Individual Time: 1107-1205 PT Individual Time Calculation (min): 58 min    Problem List:  Patient Active Problem List   Diagnosis Date Noted  . CVA (cerebral infarction) 07/21/2014  . Left leg pain 07/20/2014  . Stroke 07/19/2014  . HTN (hypertension) 07/18/2014  . HLD (hyperlipidemia) 07/18/2014  . Acute ischemic stroke 07/18/2014  . Thrombocytopenia, unspecified 07/18/2014  . Essential tremor 10/27/2013  . Obesity, unspecified 10/27/2013  . DM2 (diabetes mellitus, type 2) 10/27/2013  . Unspecified hereditary and idiopathic peripheral neuropathy 10/27/2013    Past Medical History:  Past Medical History  Diagnosis Date  . Hypertension   . High cholesterol   . DVT (deep venous thrombosis)   . Pneumonia   . Anxiety   . Essential tremor 10/27/2013  . Obesity, unspecified 10/27/2013  . Newly diagnosed diabetes 10/27/2013  . Unspecified hereditary and idiopathic peripheral neuropathy 10/27/2013   Past Surgical History:  Past Surgical History  Procedure Laterality Date  . Foot surgery    . Cataract extraction Bilateral     Assessment & Plan Clinical Impression: [name] [name] is a 80 y.o. right-handed female ([name]) limited English speaking who was visiting family in [place] and she experienced acute onset right-sided weakness with history of thrombocytopenia, hypertension and newly diagnosed diabetes mellitus. Patient independent prior to admission. She was transferred to [facility] at family's request. MRI of the brain showed acute lacunar infarct in the lateral left thalamus near the posterior limb of the left internal  capsule. MRA of the head showed left PCA to be occluded. Echocardiogram with ejection fraction of 94% grade 1 diastolic dysfunction. Carotid Dopplers with no ICA stenosis. Patient did not receive TPA. Neurology services consulted placed on aspirin and Aggrenox for CVA prophylaxis and discontinue aspirin once Aggrenox increased to twice a day after 14 days. Complaints of left leg pain denying any recent trauma with x-ray series of left lower extremity negative for fracture. Tolerating a regular consistency diet. Patient transferred to CIR on 07/21/2014 .   Patient currently requires min with mobility secondary to muscle weakness, decreased cardiorespiratoy endurance and impaired timing and sequencing, unbalanced muscle activation and decreased coordination.  Prior to hospitalization, patient was independent  with mobility and lived with Son (lives with son who works during the day) in a House home.  Home access is 1 in back, 5 in frontStairs to enter.  Patient will benefit from skilled PT intervention to maximize safe functional mobility, minimize fall risk and decrease caregiver burden for planned discharge home with 24 hour supervision.  Anticipate patient will benefit from follow up [place] at discharge.  PT - End of Session Activity Tolerance: Tolerates 30+ min activity with multiple rests;Decreased this session Endurance Deficit: Yes Endurance Deficit Description: SOB and prolonged seated rest breaks with activity PT Assessment Rehab Potential: Good Barriers to Discharge: Inaccessible home environment PT Patient demonstrates impairments in the following area(s): Balance;Endurance;Motor;Pain;Safety;Sensory;Skin Integrity PT Transfers Functional Problem(s): Bed Mobility;Bed to Chair;Car;Furniture PT Locomotion Functional Problem(s): Ambulation;Wheelchair Mobility;Stairs PT Plan PT Intensity: Minimum of 1-2 x/day ,45 to 90 minutes PT Frequency: 5 out of 7 days PT Duration Estimated Length of Stay: 5-7  days PT Treatment/Interventions: Ambulation/gait training;Balance/vestibular training;Discharge planning;DME/adaptive equipment instruction;Functional mobility  training;Neuromuscular re-education;Pain management;Patient/family education;Stair training;Therapeutic Activities;Therapeutic Exercise;UE/LE Strength taining/ROM;UE/LE Coordination activities;Wheelchair propulsion/positioning PT Transfers Anticipated Outcome(s): mod I PT Locomotion Anticipated Outcome(s): supervision PT Recommendation Follow Up Recommendations: Home health PT Patient destination: Home Equipment Recommended: To be determined  Skilled Therapeutic Intervention Skilled therapeutic intervention initiated after completion of evaluation. Discussed with patient falls and family risk, safety within room, and focus of therapy during stay. Discussed possible LOS, goals, and f/u therapy. Patient and family verbalized understanding.  PT Evaluation Precautions/Restrictions Precautions Precautions: Fall Restrictions Weight Bearing Restrictions: No General Chart Reviewed: Yes Family/Caregiver Present: Yes Vital Signs  Pain Pain Assessment Pain Assessment: No/denies pain Pain Score: 5  Pain Type: Acute pain Pain Location: Leg Pain Orientation: Right;Left Pain Descriptors / Indicators: Aching Pain Onset: On-going Home Living/Prior Functioning Home Living Available Help at Discharge: Family;Available 24 hours/day Type of Home: House Home Access: Stairs to enter [employer] of Steps: 1 in back, 5 in front Entrance Stairs-Rails: Can reach both Home Layout: One level Additional Comments: Pt speaks limited english. Interpreter present for evaluation.   Lives With: Son (lives with son who works during the day) Prior Function Level of Independence: Independent with gait;Independent with transfers  Able to Take Stairs?: Yes Driving: No Leisure: Hobbies-yes (Comment) Comments: active, likes to exercise, cook,  knitting Vision/Perception   No changes from baseline  Cognition Overall Cognitive Status: Within Functional Limits for tasks assessed Arousal/Alertness: Awake/alert Orientation Level: Oriented X4 Sensation Sensation Light Touch: Impaired Detail Light Touch Impaired Details: Impaired RUE;Impaired LLE Stereognosis: Appears Intact Hot/Cold: Appears Intact Proprioception: Appears Intact Additional Comments: reports numbness and decreased sensation to LT RUE/LLE Coordination Gross Motor Movements are Fluid and Coordinated: Yes Fine Motor Movements are Fluid and Coordinated: No Heel Shin Test: RLE impaired  Motor  Motor Motor: Hemiplegia;Abnormal postural alignment and control Motor - Skilled Clinical Observations: mild R UE > LE hemiplegia  Mobility Bed Mobility Bed Mobility: Supine to Sit;Sit to Supine Supine to Sit: 5: Supervision Sit to Supine: 5: Supervision Transfers Transfers: Yes Stand Pivot Transfers: 4: Min assist;With armrests Stand Pivot Transfer Details: Verbal cues for sequencing;Verbal cues for technique;Manual facilitation for weight shifting Stand Pivot Transfer Details (indicate cue type and reason): supervision sit <> stand, min A for transfer, cues for hand placement Locomotion  Ambulation Ambulation: Yes Ambulation/Gait Assistance: 3: Mod assist;4: Min assist Ambulation Distance (Feet): 60 Feet Assistive device: Rolling walker;1 person hand held assist (60 ft with RW min A, 60 ft with HHA and mod A) Gait Gait: Yes Gait Pattern: Impaired Gait Pattern: Step-to pattern;Step-through pattern;Decreased step length - left;Antalgic;Wide base of support;Lateral trunk lean to left;Lateral trunk lean to right;Decreased dorsiflexion - left Gait velocity: decreased Stairs / Additional Locomotion Stairs: Yes Stairs Assistance: 4: Min guard;4: Min assist Stair Management Technique: Two rails;Step to pattern;Forwards Number of Stairs: 5 Height of Stairs: 6 Engineer, manufacturing: Yes Wheelchair Assistance: 4: Advertising account executive Details: Verbal cues for Marketing executive: Both upper extremities Wheelchair Parts Management: Needs assistance Distance: 45  Trunk/Postural Assessment  Cervical Assessment Cervical Assessment: Exceptions to [facility] (forward head) Thoracic Assessment Thoracic Assessment: Exceptions to [facility] (kyphotic) Lumbar Assessment Lumbar Assessment: Exceptions to WFL (kyphotic) Postural Control Postural Control: Deficits on evaluation Protective Responses: impaired, reaches out with hands for objects instead of use of balance reaction strategies  Balance Balance Balance Assessed: Yes Static Standing Balance Static Standing - Balance Support: During functional activity;Left upper extremity supported Static Standing - Level of Assistance: 5: Stand by assistance Dynamic Standing  Balance Dynamic Standing - Balance Support: During functional activity;Left upper extremity supported (mod A) Extremity Assessment   RLE Assessment RLE Assessment: Within Functional Limits (grossly 4 to 4+/5 throughout) LLE Assessment LLE Assessment: Exceptions to [facility] LLE Strength LLE Overall Strength: Deficits;Due to pain LLE Overall Strength Comments: 3- to 4/5 throughout due to LLE pain of unknown cause  FIM:  FIM - Bed/Chair Transfer Bed/Chair Transfer Assistive Devices: Arm rests;Walker Bed/Chair Transfer: 5: Supine > Sit: Supervision (verbal cues/safety issues);5: Sit > Supine: Supervision (verbal cues/safety issues);4: Bed > Chair or W/C: Min A (steadying Pt. > 75%);4: Chair or W/C > Bed: Min A (steadying Pt. > 75%) FIM - Locomotion: Wheelchair Distance: 45 Locomotion: Wheelchair: 1: Travels less than 50 ft with minimal assistance (Pt.>75%) FIM - Locomotion: Ambulation Locomotion: Ambulation Assistive Devices: Walker - Rolling;Other (comment) (no AD) Ambulation/Gait Assistance: 3: Mod assist;4: Min  assist Locomotion: Ambulation: 2: Travels 50 - 149 ft with moderate assistance (Pt: 50 - 74%) FIM - Locomotion: Stairs Locomotion: Scientist, physiological: Hand rail - 2 Locomotion: Stairs: 2: Up and Down 4 - 11 stairs with minimal assistance (Pt.>75%)   Refer to Care Plan for Long Term Goals  Recommendations for other services: None  Discharge Criteria: Patient will be discharged from PT if patient refuses treatment 3 consecutive times without medical reason, if treatment goals not met, if there is a change in medical status, if patient makes no progress towards goals or if patient is discharged from hospital.  The above assessment, treatment plan, treatment alternatives and goals were discussed and mutually agreed upon: by patient and by family  Laretta Alstrom 07/22/2014, 12:38 PM

## 2014-07-22 NOTE — Progress Notes (Signed)
Social Work Assessment and Plan Social Work Assessment and Plan  Patient Details  Name: [name] [name] MRN: [id number] Date of Birth: 1930-01-28  Today's Date: 07/22/2014  Problem List:  Patient Active Problem List   Diagnosis Date Noted  . CVA (cerebral infarction) 07/21/2014  . Left leg pain 07/20/2014  . Stroke 07/19/2014  . HTN (hypertension) 07/18/2014  . HLD (hyperlipidemia) 07/18/2014  . Acute ischemic stroke 07/18/2014  . Thrombocytopenia, unspecified 07/18/2014  . Essential tremor 10/27/2013  . Obesity, unspecified 10/27/2013  . DM2 (diabetes mellitus, type 2) 10/27/2013  . Unspecified hereditary and idiopathic peripheral neuropathy 10/27/2013   Past Medical History:  Past Medical History  Diagnosis Date  . Hypertension   . High cholesterol   . DVT (deep venous thrombosis)   . Pneumonia   . Anxiety   . Essential tremor 10/27/2013  . Obesity, unspecified 10/27/2013  . Newly diagnosed diabetes 10/27/2013  . Unspecified hereditary and idiopathic peripheral neuropathy 10/27/2013   Past Surgical History:  Past Surgical History  Procedure Laterality Date  . Foot surgery    . Cataract extraction Bilateral    Social History:  reports that she has never smoked. She does not have any smokeless tobacco history on file. She reports that she does not drink alcohol or use illicit drugs.  Family / Support Systems Marital Status: Widow/Widower Patient Roles: Parent Children: [place]-son  [phone]  [phone] Other Supports: Public relations account executive Caregiver: Daughter here from [place] and plans to stay to assist with her care Ability/Limitations of Caregiver: Daughter to be here until not needed by pt. Caregiver Availability: 24/7 Family Dynamics: Very close knit family fours son's and two daughter's.  All are involved and supportive.  Son reports: " We will do whatever is needed."  Daughter's to provide assist and personal care, in their culture the daughter's are the  caregivers.  Social History Preferred language: Farsi; [name] Religion: Muslim Cultural Background: Persian Education: High School Read: Yes (Native language only) Write: Yes (native language only) Employment Status: Retired Fish farm manager Issues: No issues Guardian/Conservator: None-according to MD pt is capable of making her own decisions while here, but son plans to be kept abreast of all decisions while here.   Abuse/Neglect Physical Abuse: Denies Verbal Abuse: Denies Sexual Abuse: Denies Exploitation of patient/patient's resources: Denies Self-Neglect: Denies  Emotional Status Pt's affect, behavior adn adjustment status: Pt is bright and smiling and motivated to improve while here.  Son reports she is doing so much better than even on Tuesday it's like she had a turn around.  All very pleased with how well she is doing in her reocvery from this stroke. Recent Psychosocial Issues: Other medical issues-new diabetic Pyschiatric History: History of anxiety takes meds for and feels they help.  Deferred depression screen due to wanting to eat lunch and feels she is doing well, son agrees.  Will intervene if needed and will monitor through out her stay Substance Abuse History: No issues  Patient / Family Perceptions, Expectations & Goals Pt/Family understanding of illness & functional limitations: Pt and son have a good understanding of her condition.  They have several MD's in their fmaily and are keeping abreast of her medicines and treatment plan.  Son feels she is doing well and is hopeful with her daily progress. Premorbid pt/family roles/activities: Mother, grandmother, retiree, etc Anticipated changes in roles/activities/participation: resume Pt/family expectations/goals: Pt states: " I feel well."  Son states: " She had a turn around and is doing remarkably well in the  last few days, moving well."  [employer]: None Premorbid Home  Care/DME Agencies: Other (Comment) (Had OP in the past) Transportation available at discharge: family Resource referrals recommended: Support group (specify) (CVA SUpport group)  Discharge Planning Living Arrangements: Children Support Systems: Children;Other relatives;Friends/neighbors;Church/faith community Type of Residence: Private residence Financial Resources: Family Support Financial Screen Referred: No Living Expenses: Lives with family Money Management: Family Does the patient have any problems obtaining your medications?: No Home Management: Family does home management Patient/Family Preliminary Plans: Return to son's home with her daughter who is here from [place] to provide assistance as long as mother needs it.  A family member will be here at all times during her stay, this is the way their family is.  They can interpret but it is good to have an interpreter for pt since she does not speak [place], although she does understand more than she can speak, according to son.  Aware evaluations being completed and will discuss goals and length of stay when completed. Social Work Anticipated Follow Up Needs: HH/OP;Support Group  Clinical Impression Very pleasant patient with supportive involved family.  Having an interpreter is helpful since she speaks no [place].  Daughter here form [place] and will be involved in her care when discharged. Son is the spokesperson so will make sure to keep him updated regarding goals and ELOS.  Pt is doing well and will probably be a short length of stay.  Work on a safe discharge plan. Caregiver in place and participating in her care already.  Lucy Chris 07/22/2014, 1:59 PM

## 2014-07-22 NOTE — Progress Notes (Signed)
Subjective/Complaints: 80 y.o. right-handed female [name] limited English speaking who was visiting family in [place] and she experienced acute onset right-sided weakness with history of thrombocytopenia, hypertension and newly diagnosed diabetes mellitus. Patient independent prior to admission. She was transferred to [facility] at family's request. MRI of the brain showed acute lacunar infarct in the lateral left thalamus near the posterior limb of the left internal capsule. MRA of the head showed left PCA to be occluded. Echocardiogram with ejection fraction of 65% grade 1 diastolic dysfunction. Carotid Dopplers with no ICA stenosis. Patient did not receive TPA. Neurology services consulted placed on aspirin and Aggrenox for CVA prophylaxis and discontinue aspirin once Aggrenox increased to twice a day after 14 days. Complaints of left leg pain denying any recent trauma with x-ray series of left lower extremity negative for fracture.   Per RN had good night  No pain c/os this am With gestures denied breathing problems or belly pain  Ros:  Limited by language  Objective: Vital Signs: Blood pressure 125/62, pulse 72, temperature 98.6 F (37 C), temperature source Oral, resp. rate 18, weight 63.8 kg (140 lb 10.5 oz), SpO2 97.00%. Dg Hip Complete Left  07/20/2014   CLINICAL DATA:  Left leg pain  EXAM: LEFT HIP - COMPLETE 2+ VIEW  COMPARISON:  None.  FINDINGS: There is no evidence of hip fracture or dislocation. There is mild narrow bilateral hip joint spaces. Osteitis pubis is noted.  IMPRESSION: No acute fracture or dislocation.   Electronically Signed   By: Sherian Rein M.D.   On: 07/20/2014 13:07   Dg Femur Left  07/20/2014   CLINICAL DATA:  Left leg pain, no known injury.  EXAM: LEFT FEMUR - 2 VIEW  COMPARISON:  None.  FINDINGS: There is no evidence of fracture or dislocation. Soft tissues are unremarkable.  IMPRESSION: No acute fracture or dislocation of the left  femur.   Electronically Signed   By: Sherian Rein M.D.   On: 07/20/2014 13:08   Dg Tibia/fibula Left  07/20/2014   CLINICAL DATA:  Left leg pain, no known injury.  EXAM: LEFT TIBIA AND FIBULA - 2 VIEW  COMPARISON:  None.  FINDINGS: There is no evidence of fracture or dislocation. Soft tissues are unremarkable.  IMPRESSION: No acute fracture or dislocation.   Electronically Signed   By: Sherian Rein M.D.   On: 07/20/2014 13:09   Dg Foot Complete Left  07/20/2014   CLINICAL DATA:  Left leg pain  EXAM: LEFT FOOT - COMPLETE 3+ VIEW  COMPARISON:  None.  FINDINGS: There is no evidence of fracture or dislocation. There is calcification at the insertion of Achilles tendon to the calcaneus. Soft tissues are unremarkable.  IMPRESSION: No acute fracture or dislocation.   Electronically Signed   By: Sherian Rein M.D.   On: 07/20/2014 13:11   Results for orders placed during the hospital encounter of 07/21/14 (from the past 72 hour(s))  GLUCOSE, CAPILLARY     Status: Abnormal   Collection Time    07/21/14  4:33 PM      Result Value Ref Range   Glucose-Capillary 119 (*) 70 - 99 mg/dL   Comment 1 Notify RN    GLUCOSE, CAPILLARY     Status: Abnormal   Collection Time    07/21/14  8:25 PM      Result Value Ref Range   Glucose-Capillary 103 (*) 70 - 99 mg/dL      Physical Exam:  Physical Exam  Constitutional: She appears well-developed.  HENT:  Head: Normocephalic.  Eyes: EOM are normal.  Neck: Normal range of motion. Neck supple. No thyromegaly present.  Cardiovascular: Normal rate and regular rhythm.  Respiratory: Effort normal and breath sounds normal. No respiratory distress.  GI: Soft. Bowel sounds are normal. She exhibits no distension.  Neurological: She is alert.  Patient limited English speaking. She does make good eye contact with examiner. She was able to communicate her name, age and follows simple commands. Mild right upper extremity weakness---4/5 prox to distal. RLE: HF 3+/5,  KE4-/5. ADF/APF 4-/5. Sensory testing limited by communication. Normal strength on the left Musculoskeletal-left groin pain with range of motion, decreased left hip range of motion with flexion abduction and adduction no pain with active hip flexion Skin: Skin is warm and dry   Assessment/Plan: 1. Functional deficits secondary to Left thalamic and PLIC infarcts which require 3+ hours per day of interdisciplinary therapy in a comprehensive inpatient rehab setting. Physiatrist is providing close team supervision and 24 hour management of active medical problems listed below. Physiatrist and rehab team continue to assess barriers to discharge/monitor patient progress toward functional and medical goals. FIM:                                  Medical Problem List and Plan:  1. Functional deficits secondary to left thalamus, PLIC. infarct  2. DVT Prophylaxis/Anticoagulation: SCDs. Monitor for any signs of DVT. Venous Doppler studies 07/20/2014 negative  3. Pain Management: Neurontin 300 mg each bedtime, Ultram 50 mg each bedtime . Monitor with increased mobility  4. Mood/anxiety: Xanax 0.25 mg twice a day. Provide emotional support  5. Neuropsych: This patient is capable of making decisions on her own behalf (with trranslation).  6. Skin/Wound Care: Routine skin checks  7. History of thrombocytopenia. Latest platelet count 98,000. Monitor for any bleeding episodes  8. Diabetes mellitus with peripheral neuropathy. Hemoglobin A1c 6.0. Check blood sugars a.c. and at bedtime. Patient on Glucophage 250 mg daily prior to admission. Resume as tolerated  9. Hypertension. No current antihypertensive medication. Patient on atenolol 25 mg daily and Cardizem 15 mg daily, Lasix 20 mg daily prior to admission. Allowing permissive hypertension. Resume as needed monitor with increased mobility  10. Hyperlipidemia. Lipitor  11. Benign essential tremor. Continue Mysoline 250 mg twice a day as prior  to admission    LOS (Days) 1 A FACE TO FACE EVALUATION WAS PERFORMED  KIRSTEINS,ANDREW E 07/22/2014, 6:36 AM

## 2014-07-23 ENCOUNTER — Encounter (HOSPITAL_COMMUNITY): Payer: Self-pay | Admitting: Occupational Therapy

## 2014-07-23 ENCOUNTER — Inpatient Hospital Stay (HOSPITAL_COMMUNITY): Payer: Self-pay | Admitting: Occupational Therapy

## 2014-07-23 ENCOUNTER — Inpatient Hospital Stay (HOSPITAL_COMMUNITY): Payer: Medicare Other | Admitting: *Deleted

## 2014-07-23 DIAGNOSIS — I10 Essential (primary) hypertension: Secondary | ICD-10-CM

## 2014-07-23 DIAGNOSIS — I6350 Cerebral infarction due to unspecified occlusion or stenosis of unspecified cerebral artery: Secondary | ICD-10-CM

## 2014-07-23 DIAGNOSIS — E119 Type 2 diabetes mellitus without complications: Secondary | ICD-10-CM

## 2014-07-23 DIAGNOSIS — D696 Thrombocytopenia, unspecified: Secondary | ICD-10-CM

## 2014-07-23 LAB — GLUCOSE, CAPILLARY
GLUCOSE-CAPILLARY: 88 mg/dL (ref 70–99)
GLUCOSE-CAPILLARY: 98 mg/dL (ref 70–99)
GLUCOSE-CAPILLARY: 120 mg/dL — AB (ref 70–99)
Glucose-Capillary: 92 mg/dL (ref 70–99)

## 2014-07-23 NOTE — Progress Notes (Signed)
Physical Therapy Session Note  Patient Details  Name: [name] [name] MRN: [id number] Date of Birth: [birth year]  Today's Date: 07/23/2014 Skilled Therapeutic Interventions/Progress Updates:  Session I 1000-1100 (60 min) Patient in recliner, translator present.Patient agrees to therapy.  Gait traing 3 x 50 feet and 1 x 100 feet with min A and TC for posture, RW as AD,VC for sequencing and placing RW. Stairs 1 x 3 and 1 x 10 with min A and use of B Rails, decreased velocity and step to pattern.  Activities in standing to increase balance: Step ups, around cones rotations while standing on One LE with mod A, Standing on faoam 3 x 30 seconds with max A, Obstacle course with maneuvering around obstacles and stepping over canes, VC for AD position.  At the end of the session patient requested to use a bathroom-min A needed. Patient was able to stand by the sink to wash her hands and brush her teeth with min A.  Patient left in a room ,in a recliner all needs within reach, daughter present in room. No complains of pain during this session.  Session II 1300-1330 (30 min)  Patient in bed , Stand by assistance for bed mobility and transfer to sit.  Gait training to gym with RW with min A and 1 x LOB,patient able to recover on her own.  NuStep 2 x 5 min on level 3 to increase strength and facilitate reciprocal movement. Standing and performing reaching activities with crossing midline in order to increase balance and coordination as well as facilitate weight shifts with turning R and L.  Patient was able to perform gait back to the room with RW, use Bathroom with min A. At the end of the session patient was transferred for OT. No complains of pain.     Therapy Documentation Precautions:  Precautions Precautions: Fall Restrictions Weight Bearing Restrictions: No Vital Signs: Therapy Vitals Temp: 97.6 F (36.4 C) Temp src: Oral Pulse Rate: 64 Resp: 18 BP: 128/48 mmHg Patient Position  (if appropriate): Sitting Oxygen Therapy SpO2: 97 % O2 Device: None (Room air)  See FIM for current functional status  Therapy/Group: Individual Therapy  Dorna Mai 07/23/2014, 10:52 AM

## 2014-07-23 NOTE — Progress Notes (Signed)
[name] [name] is a 80 y.o. female 08-14-1930 [id number]  Subjective: No new complaints. No new problems. Slept well. Feeling OK.  Objective: Vital signs in last 24 hours: Temp:  [97.6 F (36.4 C)-98.2 F (36.8 C)] 97.6 F (36.4 C) (08/29 0700) Pulse Rate:  [63-82] 64 (08/29 0700) Resp:  [17-18] 18 (08/29 0700) BP: (123-130)/(48-76) 128/48 mmHg (08/29 0700) SpO2:  [97 %-98 %] 97 % (08/29 0700) Weight change:  Last BM Date: 07/21/14  Intake/Output from previous day: 08/28 0701 - 08/29 0700 In: 240 [P.O.:240] Out: -  Last cbgs: CBG (last 3)   Recent Labs  07/22/14 1635 07/22/14 2109 07/23/14 0703  GLUCAP 117* 99 88     Physical Exam General: No apparent distress   HEENT: not dry Lungs: Normal effort. Lungs clear to auscultation, no crackles or wheezes. Cardiovascular: Regular rate and rhythm, no edema Abdomen: S/NT/ND; BS(+) Musculoskeletal:  unchanged Neurological: No new neurological deficits Wounds: N/A    Skin: clear  Aging changes Mental state: Alert, oriented, cooperative    Lab Results: BMET    Component Value Date/Time   NA 139 07/22/2014 0801   K 4.1 07/22/2014 0801   CL 102 07/22/2014 0801   CO2 27 07/22/2014 0801   GLUCOSE 143* 07/22/2014 0801   BUN 14 07/22/2014 0801   CREATININE 0.74 07/22/2014 0801   CALCIUM 8.6 07/22/2014 0801   GFRNONAA 78* 07/22/2014 0801   GFRAA >90 07/22/2014 0801   CBC    Component Value Date/Time   WBC 4.9 07/22/2014 0801   RBC 4.82 07/22/2014 0801   HGB 14.8 07/22/2014 0801   HCT 44.5 07/22/2014 0801   PLT 105* 07/22/2014 0801   MCV 92.3 07/22/2014 0801   MCH 30.7 07/22/2014 0801   MCHC 33.3 07/22/2014 0801   RDW 13.9 07/22/2014 0801   LYMPHSABS 1.6 07/22/2014 0801   MONOABS 0.4 07/22/2014 0801   EOSABS 0.1 07/22/2014 0801   BASOSABS 0.0 07/22/2014 0801    Studies/Results: No results found.  Medications: I have reviewed the patient's current medications.  Assessment/Plan:   1. 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 infarct  2. DVT Prophylaxis/Anticoagulation: SCDs. Monitor for any signs of DVT. Venous Doppler studies 07/20/2014 negative  3. Pain Management: Neurontin 300 mg each bedtime, Ultram 50 mg each bedtime . Monitor with increased mobility  4. Mood/anxiety: Xanax 0.25 mg twice a day. Provide emotional support  5. Neuropsych: This patient is capable of making decisions on her own behalf (with trranslation).  6. Skin/Wound Care: Routine skin checks  7. History of thrombocytopenia. Latest platelet count 98,000. Monitor for any bleeding episodes  8. Diabetes mellitus with peripheral neuropathy. Hemoglobin A1c 6.0. Check blood sugars a.c. and at bedtime. Patient on Glucophage 250 mg daily prior to admission. Resume as tolerated  9. Hypertension. No current antihypertensive medication. Patient on atenolol 25 mg daily and Cardizem 15 mg daily, Lasix 20 mg daily prior to admission. Allowing permissive hypertension. Resume as needed monitor with increased mobility  10. Hyperlipidemia. Lipitor  11. Benign essential tremor. Continue Mysoline 250 mg twice a day as prior to admission  Cont Rx   Length of stay, days: 2  Sonda Primes , MD 07/23/2014, 8:41 AM

## 2014-07-23 NOTE — Progress Notes (Signed)
Occupational Therapy Session Note  Patient Details  Name: [name] [name] MRN: [id number] Date of Birth: [birth year]  Today's Date: 07/23/2014 OT Individual Time: [phone] and 1330 -1400 OT Individual Time Calculation (min): 60 min and 30   Short Term Goals: Week 1:  OT Short Term Goal 1 (Week 1): short term goals = LTG secondary to short ELOS  Skilled Therapeutic Interventions/Progress Updates:  Session 1 (4782-9562)- Pt with no c/o pain this session. Pt seated in wheelchair awaiting therapist with interpreter present in the room. Pt propelled wheelchair with B UEs to ADL apartment for ADL tasks. OT educated and demonstrated tub bench transfer within shower/tub combination. Pt returned demonstration with steady assist for safety. Pt performed bathing seated on bench with supervision for safety. Verbal cues given for lateral leans on bench to wash buttocks. Pt performed dressing seated on edge of tub bench with set up A for UB dressing and steady assist for dynamic standing balance during LB dressing. Pt encouraged to perform pursed lip breathing as pt began to fatigue. Pt ambulated to room with RW and steady assist for safety. Pt requiring rest break for fatigue. Patient's family present in room when entering and OT demonstrated and educated tub transfer and DME use for her daughter.   Session 2 (1330-1400)- Pt finishing session with PT and transitioning nicely to OT intervention. Pt continues to have no c/o pain this session. Her daughter present in room to assist with interpretations. Pt performed 3 sets of 10 lat pull downs, chest pulls, and bicep curls with green, level 3 theraband for  B UE strengthening. Pt performed 3 sets of 10 chair push ups in order to increase B UE strength for functional transfers. Upon exiting the room, pt supine with call bell and all needed items within reach. Daughter still present in room.   Therapy Documentation Precautions:  Precautions Precautions:  Fall Restrictions Weight Bearing Restrictions: No  See FIM for current functional status  Therapy/Group: Individual Therapy  Lowella Grip 07/23/2014, 12:37 PM

## 2014-07-24 ENCOUNTER — Encounter (HOSPITAL_COMMUNITY): Payer: Self-pay | Admitting: Occupational Therapy

## 2014-07-24 DIAGNOSIS — E118 Type 2 diabetes mellitus with unspecified complications: Secondary | ICD-10-CM

## 2014-07-24 LAB — GLUCOSE, CAPILLARY
GLUCOSE-CAPILLARY: 99 mg/dL (ref 70–99)
GLUCOSE-CAPILLARY: 97 mg/dL (ref 70–99)
Glucose-Capillary: 82 mg/dL (ref 70–99)
Glucose-Capillary: 91 mg/dL (ref 70–99)

## 2014-07-24 NOTE — Progress Notes (Signed)
Occupational Therapy Session Note  Patient Details  Name: [name] [name] MRN: [id number] Date of Birth: 1929/11/28  Today's Date: 07/24/2014 OT Individual Time: 4098-1191 OT Individual Time Calculation (min): 56 min    Short Term Goals: Week 1:  OT Short Term Goal 1 (Week 1): short term goals = LTG secondary to short ELOS  Skilled Therapeutic Interventions/Progress Updates:    Patient seen this am for OT intervention to address activity tolerance (endurance), balance, and coordination during basic self care activities.  Patient indicated need to void, and ambulated to bathroom with rolling walker and minimal assistance.  Patient able to transition sit to stand with close supervision, and cueing to push up (versus pull up) with UE's to transition to standing.  Patient able to walk with minimal assistance  Partly to/from gym with one minor loss of balance from which she needed assistance to recover.  Patient with tremors (long standing per patient) in left UE > right UE.  Tremors limit fine motor coordination in both hands.  9 Hole peg test:  RUE:  1 min 12 sec, Left UE 1 min 5 sec.  Worked on right UE coordination, pinch, grasp, release, in-hand manipulation of objects.  Patient attentive to right side throughout this session.    Therapy Documentation Precautions:  Precautions Precautions: Fall Restrictions Weight Bearing Restrictions: No   Pain: Pain Assessment Pain Score: 0-No pain   See FIM for current functional status  Therapy/Group: Individual Therapy  Collier Salina 07/24/2014, 10:00 AM

## 2014-07-24 NOTE — Progress Notes (Signed)
Called to room by family. Family reported, patient can't see as clear out of OD-like [name] "curtain" over eye. And complained of increased weakness to right hand & RUE. Spoke with rapid response nurse, paged Dr. Lesia Hausen. Instructed to monitor and call with changes. Vitals given and in computer. Family at bedside. [name] [name]

## 2014-07-24 NOTE — Progress Notes (Signed)
Coughing noted when drinking with a straw. Did some better after all straws removed. Family reports observing coughing with liquids. Refused miralax. Complained of right shoulder pain, managed with scheduled pain med. [name] [name] A

## 2014-07-24 NOTE — Progress Notes (Signed)
[name] [name] is a 80 y.o. female 1930/02/14 [id number]  Subjective: No new complaints. No new problems. Slept well. Feeling the same.  Objective: Vital signs in last 24 hours: Temp:  [97.7 F (36.5 C)-98 F (36.7 C)] 97.7 F (36.5 C) (08/30 0550) Pulse Rate:  [64-72] 64 (08/30 0728) Resp:  [18] 18 (08/30 0550) BP: (104-125)/(49-72) 125/56 mmHg (08/30 0728) SpO2:  [97 %-98 %] 98 % (08/30 0550) Weight change:  Last BM Date: 07/23/14  Intake/Output from previous day: 08/29 0701 - 08/30 0700 In: 1800 [P.O.:1800] Out: -  Last cbgs: CBG (last 3)   Recent Labs  07/23/14 1632 07/23/14 2108 07/24/14 0712  GLUCAP 98 120* 99     Physical Exam General: No apparent distress   HEENT: not dry Lungs: Normal effort. Lungs clear to auscultation, no crackles or wheezes. Cardiovascular: Regular rate and rhythm, no edema Abdomen: S/NT/ND; BS(+) Musculoskeletal:  unchanged Neurological: No new neurological deficits Wounds: N/A    Skin: clear  Aging changes Mental state: Alert, oriented, cooperative    Lab Results: BMET    Component Value Date/Time   NA 139 07/22/2014 0801   K 4.1 07/22/2014 0801   CL 102 07/22/2014 0801   CO2 27 07/22/2014 0801   GLUCOSE 143* 07/22/2014 0801   BUN 14 07/22/2014 0801   CREATININE 0.74 07/22/2014 0801   CALCIUM 8.6 07/22/2014 0801   GFRNONAA 78* 07/22/2014 0801   GFRAA >90 07/22/2014 0801   CBC    Component Value Date/Time   WBC 4.9 07/22/2014 0801   RBC 4.82 07/22/2014 0801   HGB 14.8 07/22/2014 0801   HCT 44.5 07/22/2014 0801   PLT 105* 07/22/2014 0801   MCV 92.3 07/22/2014 0801   MCH 30.7 07/22/2014 0801   MCHC 33.3 07/22/2014 0801   RDW 13.9 07/22/2014 0801   LYMPHSABS 1.6 07/22/2014 0801   MONOABS 0.4 07/22/2014 0801   EOSABS 0.1 07/22/2014 0801   BASOSABS 0.0 07/22/2014 0801    Studies/Results: No results found.  Medications: I have reviewed the patient's current medications.  Assessment/Plan:   1. 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 infarct  2. DVT Prophylaxis/Anticoagulation: SCDs. Monitor for any signs of DVT. Venous Doppler studies 07/20/2014 negative  3. Pain Management: Neurontin 300 mg each bedtime, Ultram 50 mg each bedtime . Monitor with increased mobility  4. Mood/anxiety: Xanax 0.25 mg twice a day. Provide emotional support  5. Neuropsych: This patient is capable of making decisions on her own behalf (with trranslation).  6. Skin/Wound Care: Routine skin checks  7. History of thrombocytopenia. Latest platelet count 98,000. Monitor for any bleeding episodes  8. Diabetes mellitus with peripheral neuropathy. Hemoglobin A1c 6.0. Check blood sugars a.c. and at bedtime. Patient on Glucophage 250 mg daily prior to admission. Resume as tolerated  9. Hypertension. No current antihypertensive medication. Patient on atenolol 25 mg daily and Cardizem 15 mg daily, Lasix 20 mg daily prior to admission. Allowing permissive hypertension. Resume as needed monitor with increased mobility  10. Hyperlipidemia. Lipitor  11. Benign essential tremor. Continue Mysoline 250 mg twice a day as prior to admission  Cont current Rx   Length of stay, days: 3  Sonda Primes , MD 07/24/2014, 8:03 AM

## 2014-07-25 ENCOUNTER — Inpatient Hospital Stay (HOSPITAL_COMMUNITY): Payer: Medicare Other | Admitting: Physical Therapy

## 2014-07-25 ENCOUNTER — Inpatient Hospital Stay (HOSPITAL_COMMUNITY): Payer: Self-pay | Admitting: Physical Therapy

## 2014-07-25 ENCOUNTER — Inpatient Hospital Stay (HOSPITAL_COMMUNITY): Payer: Medicare Other | Admitting: Occupational Therapy

## 2014-07-25 DIAGNOSIS — I69993 Ataxia following unspecified cerebrovascular disease: Secondary | ICD-10-CM

## 2014-07-25 DIAGNOSIS — Z5189 Encounter for other specified aftercare: Secondary | ICD-10-CM

## 2014-07-25 LAB — GLUCOSE, CAPILLARY
GLUCOSE-CAPILLARY: 101 mg/dL — AB (ref 70–99)
GLUCOSE-CAPILLARY: 101 mg/dL — AB (ref 70–99)
Glucose-Capillary: 97 mg/dL (ref 70–99)
Glucose-Capillary: 99 mg/dL (ref 70–99)

## 2014-07-25 NOTE — Plan of Care (Signed)
Problem: RH PAIN MANAGEMENT Goal: RH STG PAIN MANAGED AT OR BELOW PT'S PAIN GOAL <3 Outcome: Progressing No c/o pain     

## 2014-07-25 NOTE — Progress Notes (Signed)
Physical Therapy Session Note  Patient Details  Name: [name] [name] MRN: [id number] Date of Birth: 1929/12/24  Today's Date: 07/25/2014 PT Individual Time: [phone] and 3086-5784 PT Individual Time Calculation (min): 60 min and 60 min  Short Term Goals: Week 1:  PT Short Term Goal 1 (Week 1): = LTGs due to anticipated LOS  Skilled Therapeutic Interventions/Progress Updates:   AM Session: Focus on balance, activity tolerance, gait training with different ADs. Pt received semi reclined in bed, daughter present for session. Interpreter present for second half of session. Pt had episode of RUE weakness and new onset difficulty with R eye vision over weekend but reports improvement this date. Pt appears with very mild strength deficits in R versus L grip strength. With HOB flat, pt transferred supine > sit with supervision and ambulated to/from bathroom and sink using RW with min guard. Gait training using RW 2 x 150 ft, min guard progressed to close supervision. Pt demo decreased gait speed but improved R foot clearance, requires verbal cues for upright posture and forward gaze and keeping safe distance between self and RW (tends to stay too close to front of walker). Berg Balance Scale administered with score of 32/56 (details below). Pt and daughter educated score interpretation that patient remains at high risk of falls, verbalized understanding. Pt requires frequent seated rest breaks due to SOB with gait and BBS, verbal cues needed for pursed lip breathing. Pt ambulated with use of NBQC at baseline. Due to progression with use of RW to supervision, initiated gait training with use of NBQC and mod A, max verbal/visual cues for sequencing 3-step gait pattern. Pt with narrow BOS, increased L step length, and step-to pattern with use of quad cane. Continue gait training with RW at this time due to decreased safety and impaired balance with use of cane. Pt returned to room and left sitting in recliner  with daughter, interpreter, and RN present. Patient and family educated not to use straws due to episodes of coughing with straw over weekend and on evaluation, verbalized understanding.   PM Session: Focus on activity tolerance, standing balance, R NMR, gait, and transfers. Pt received supine in bed, daughter and son present for session. Pt requesting to use toilet and ambulated to/from bathroom and sink to wash hands with supervision. Gait training room > ortho gym and ortho gym > neuro gym using RW with supervision, cues for upright posture and forward gaze, noted decreased speed compared to this AM. Pt performed car transfer using RW with supervision and verbal cues for sequencing. Pt negotiated up/down 4 stairs using 2 rails with overall supervision, step-to pattern. Pt participated in reaching task using RUE to pick clothespins up from bucket on floor and place high on vertical rod, min guard. Pt participated in game of horseshoes with multidirectional reaching outside BOS and across midline using RUE to retrieve horseshoes before tossing. For overall strengthening, endurance, and R NMR, NuStep using BUE/LE level 5 x 10 min total with 1 rest break. Pt returned to room using RW x 150 ft, supervision and left seated in recliner with family present.   Therapy Documentation Precautions:  Precautions Precautions: Fall Restrictions Weight Bearing Restrictions: No Pain:  Denies pain Locomotion : Ambulation Ambulation/Gait Assistance: 4: Min guard;5: Supervision  Balance: Balance Balance Assessed: Yes Standardized Balance Assessment Standardized Balance Assessment: Berg Balance Test Berg Balance Test Sit to Stand: Able to stand  independently using hands Standing Unsupported: Able to stand 30 seconds unsupported Sitting with Back Unsupported  but Feet Supported on Floor or Stool: Able to sit safely and securely 2 minutes Stand to Sit: Controls descent by using hands Transfers: Able to transfer  safely, definite need of hands Standing Unsupported with Eyes Closed: Able to stand 10 seconds with supervision Standing Ubsupported with Feet Together: Able to place feet together independently and stand for 1 minute with supervision From Standing, Reach Forward with Outstretched Arm: Can reach forward >12 cm safely (5") From Standing Position, Pick up Object from Floor: Able to pick up shoe, needs supervision From Standing Position, Turn to Look Behind Over each Shoulder: Needs assist to keep from losing balance and falling Turn 360 Degrees: Needs close supervision or verbal cueing Standing Unsupported, Alternately Place Feet on Step/Stool: Able to complete 4 steps without aid or supervision Standing Unsupported, One Foot in Front: Needs help to step but can hold 15 seconds Standing on One Leg: Tries to lift leg/unable to hold 3 seconds but remains standing independently Total Score: 32/56  See FIM for current functional status  Therapy/Group: Individual Therapy  [name] [name] 07/25/2014, 9:39 AM

## 2014-07-25 NOTE — Progress Notes (Signed)
Occupational Therapy Session Note  Patient Details  Name: [name] [name] MRN: [id number] Date of Birth: 11/13/1930  Today's Date: 07/25/2014 OT Individual Time: 1003-1103 OT Individual Time Calculation (min): 60 min   Short Term Goals: Week 1:  OT Short Term Goal 1 (Week 1): short term goals = LTG secondary to short ELOS  Skilled Therapeutic Interventions/Progress Updates:  Patient resting in recliner with family and interpreter in room.  Engaged in self care retraining to include shower, dress and groom tasks.  Focused session on functional mobility, activity tolerance, standing tolerance, UE coordination and forced use of dominant RUE.  Patient ambulated with RW in room to gather clothes, to and from toilet and shower, stand at sink and RUE coordination exercises.  Patient's family members stepped out during most of the session and the interpreter stayed the entire session.  Interpreter often doing too much for patient and providing VCs that may not be necessary.  Provided feedback to interpreter regarding the kind of assistance she was providing and she verbalized understanding.  Therapy Documentation Precautions:  Precautions Precautions: Fall Restrictions Weight Bearing Restrictions: No Pain: RLE feeling numb yet no report of pain. ADL: See FIM for current functional status  Therapy/Group: Individual Therapy  Hemi Chacko 07/25/2014, 8:28 AM

## 2014-07-25 NOTE — Progress Notes (Signed)
Ambulated to bathroom, reporting previous symptoms have resolved. [name] [name]

## 2014-07-25 NOTE — Plan of Care (Signed)
Problem: RH SAFETY Goal: RH STG ADHERE TO SAFETY PRECAUTIONS W/ASSISTANCE/DEVICE STG Adhere to Safety Precautions With Assistance/Device. Supervision  Outcome: Progressing No unsafe behavior noted     

## 2014-07-25 NOTE — Progress Notes (Signed)
Updated Dr. Lesia Hausen about continued symptoms. And family member feels that OD drooping more and speech more slurred. No new orders at this time, monitor. No obvious change to RN in relation to increase in eye drooping. Bilateral hand grips checked with right slightly weaker than left. Left hand tremors, not new. Family member remains at bedside. [name]

## 2014-07-25 NOTE — Plan of Care (Signed)
Problem: RH SKIN INTEGRITY Goal: RH STG SKIN FREE OF INFECTION/BREAKDOWN Skin to remain free from infection/breakdown while on rehab With min assisst.  Outcome: Progressing No skin breakdown noted

## 2014-07-25 NOTE — Progress Notes (Signed)
Subjective/Complaints: 80 y.o. right-handed female ([name]) limited English speaking who was visiting family in [place] and she experienced acute onset right-sided weakness with history of thrombocytopenia, hypertension and newly diagnosed diabetes mellitus. Patient independent prior to admission. She was transferred to [facility] at family's request. MRI of the brain showed acute lacunar infarct in the lateral left thalamus near the posterior limb of the left internal capsule. MRA of the head showed left PCA to be occluded. Echocardiogram with ejection fraction of 27% grade 1 diastolic dysfunction. Carotid Dopplers with no ICA stenosis. Patient did not receive TPA. Neurology services consulted placed on aspirin and Aggrenox for CVA prophylaxis and discontinue aspirin once Aggrenox increased to twice a day after 14 days. Complaints of left leg pain denying any recent trauma with x-ray series of left lower extremity negative for fracture.   Right eye felt blurry, right hand felt weaker yesterday pm, was harder to feed herself, feels more normal today.  Felt tired yesterday  Daughter in room with her this am Ros:  Limited by language  Objective: Vital Signs: Blood pressure 106/50, pulse 62, temperature 98 F (36.7 C), temperature source Oral, resp. rate 18, weight 63.8 kg (140 lb 10.5 oz), SpO2 97.00%. No results found. Results for orders placed during the hospital encounter of 07/21/14 (from the past 72 hour(s))  CBC WITH DIFFERENTIAL     Status: Abnormal   Collection Time    07/22/14  8:01 AM      Result Value Ref Range   WBC 4.9  4.0 - 10.5 K/uL   RBC 4.82  3.87 - 5.11 MIL/uL   Hemoglobin 14.8  12.0 - 15.0 g/dL   HCT 44.5  36.0 - 46.0 %   MCV 92.3  78.0 - 100.0 fL   MCH 30.7  26.0 - 34.0 pg   MCHC 33.3  30.0 - 36.0 g/dL   RDW 13.9  11.5 - 15.5 %   Platelets 105 (*) 150 - 400 K/uL   Comment: CONSISTENT WITH PREVIOUS RESULT   Neutrophils Relative % 58  43 - 77  %   Neutro Abs 2.8  1.7 - 7.7 K/uL   Lymphocytes Relative 32  12 - 46 %   Lymphs Abs 1.6  0.7 - 4.0 K/uL   Monocytes Relative 9  3 - 12 %   Monocytes Absolute 0.4  0.1 - 1.0 K/uL   Eosinophils Relative 2  0 - 5 %   Eosinophils Absolute 0.1  0.0 - 0.7 K/uL   Basophils Relative 0  0 - 1 %   Basophils Absolute 0.0  0.0 - 0.1 K/uL  COMPREHENSIVE METABOLIC PANEL     Status: Abnormal   Collection Time    07/22/14  8:01 AM      Result Value Ref Range   Sodium 139  137 - 147 mEq/L   Potassium 4.1  3.7 - 5.3 mEq/L   Chloride 102  96 - 112 mEq/L   CO2 27  19 - 32 mEq/L   Glucose, Bld 143 (*) 70 - 99 mg/dL   BUN 14  6 - 23 mg/dL   Creatinine, Ser 0.74  0.50 - 1.10 mg/dL   Calcium 8.6  8.4 - 10.5 mg/dL   Total Protein 7.0  6.0 - 8.3 g/dL   Albumin 3.1 (*) 3.5 - 5.2 g/dL   AST 24  0 - 37 U/L   ALT 26  0 - 35 U/L   Alkaline Phosphatase 71  39 - 117  U/L   Total Bilirubin 0.3  0.3 - 1.2 mg/dL   GFR calc non Af Amer 78 (*) >90 mL/min   GFR calc Af Amer >90  >90 mL/min   Comment: (NOTE)     The eGFR has been calculated using the CKD EPI equation.     This calculation has not been validated in all clinical situations.     eGFR's persistently <90 mL/min signify possible Chronic Kidney     Disease.   Anion gap 10  5 - 15  GLUCOSE, CAPILLARY     Status: None   Collection Time    07/22/14 12:02 PM      Result Value Ref Range   Glucose-Capillary 75  70 - 99 mg/dL  GLUCOSE, CAPILLARY     Status: Abnormal   Collection Time    07/22/14  4:35 PM      Result Value Ref Range   Glucose-Capillary 117 (*) 70 - 99 mg/dL  GLUCOSE, CAPILLARY     Status: None   Collection Time    07/22/14  9:09 PM      Result Value Ref Range   Glucose-Capillary 99  70 - 99 mg/dL  GLUCOSE, CAPILLARY     Status: None   Collection Time    07/23/14  7:03 AM      Result Value Ref Range   Glucose-Capillary 88  70 - 99 mg/dL  GLUCOSE, CAPILLARY     Status: None   Collection Time    07/23/14 11:22 AM      Result Value  Ref Range   Glucose-Capillary 92  70 - 99 mg/dL  GLUCOSE, CAPILLARY     Status: None   Collection Time    07/23/14  4:32 PM      Result Value Ref Range   Glucose-Capillary 98  70 - 99 mg/dL   Comment 1 Notify RN    GLUCOSE, CAPILLARY     Status: Abnormal   Collection Time    07/23/14  9:08 PM      Result Value Ref Range   Glucose-Capillary 120 (*) 70 - 99 mg/dL  GLUCOSE, CAPILLARY     Status: None   Collection Time    07/24/14  7:12 AM      Result Value Ref Range   Glucose-Capillary 99  70 - 99 mg/dL  GLUCOSE, CAPILLARY     Status: None   Collection Time    07/24/14 11:34 AM      Result Value Ref Range   Glucose-Capillary 82  70 - 99 mg/dL   Comment 1 Notify RN    GLUCOSE, CAPILLARY     Status: None   Collection Time    07/24/14  4:25 PM      Result Value Ref Range   Glucose-Capillary 97  70 - 99 mg/dL  GLUCOSE, CAPILLARY     Status: None   Collection Time    07/24/14  8:57 PM      Result Value Ref Range   Glucose-Capillary 91  70 - 99 mg/dL  GLUCOSE, CAPILLARY     Status: None   Collection Time    07/25/14  6:48 AM      Result Value Ref Range   Glucose-Capillary 97  70 - 99 mg/dL      Physical Exam:    Physical Exam  Constitutional: She appears well-developed.  HENT:  Head: Normocephalic.  Eyes: EOM are normal. Not injected no drainage Neck: Normal range of motion. Neck supple. No thyromegaly  present.  Cardiovascular: Normal rate and regular rhythm.  Respiratory: Effort normal and breath sounds normal. No respiratory distress.  GI: Soft. Bowel sounds are normal. She exhibits no distension.  Neurological: She is alert.  Patient limited English speaking. She does make good eye contact with examiner. She was able to communicate her name, age and follows simple commands. Mild right upper extremity weakness---4/5 prox to distal. RLE: HF 3+/5, KE4-/5. ADF/APF 4-/5. Sensory testing limited by communication. Normal strength on the left , visual fields intact to  confrontation testing , EOMI, PERRLA, Musculoskeletal- Skin: Skin is warm and dry   Assessment/Plan: 1. Functional deficits secondary to Left thalamic and PLIC infarcts which require 3+ hours per day of interdisciplinary therapy in a comprehensive inpatient rehab setting. Physiatrist is providing close team supervision and 24 hour management of active medical problems listed below. Physiatrist and rehab team continue to assess barriers to discharge/monitor patient progress toward functional and medical goals. Episode of increased weakness RUE now resolved already on Aggrenox and ASA with plan for BID ASA in another week.  Monitor for recurrent symptoms, no ICA occlusion on MRA , did have L PCA occlsuion FIM: FIM - Bathing Bathing Steps Patient Completed: Chest;Right Arm;Left Arm;Abdomen;Front perineal area;Buttocks;Right upper leg;Left upper leg;Right lower leg (including foot);Left lower leg (including foot) Bathing: 5: Supervision: Safety issues/verbal cues  FIM - Upper Body Dressing/Undressing Upper body dressing/undressing steps patient completed: Thread/unthread right sleeve of pullover shirt/dresss;Thread/unthread left sleeve of pullover shirt/dress;Put head through opening of pull over shirt/dress;Pull shirt over trunk;Button/unbutton shirt Upper body dressing/undressing: 5: Set-up assist to: Obtain clothing/put away FIM - Lower Body Dressing/Undressing Lower body dressing/undressing steps patient completed: Thread/unthread right underwear leg;Thread/unthread left underwear leg;Pull underwear up/down;Pull pants up/down;Don/Doff right sock;Don/Doff left sock Lower body dressing/undressing: 4: Steadying Assist  FIM - Toileting Toileting steps completed by patient: Adjust clothing prior to toileting;Performs perineal hygiene;Adjust clothing after toileting Toileting Assistive Devices: Grab bar or rail for support Toileting: 5: Supervision: Safety issues/verbal cues  FIM - Glass blower/designer Devices: Insurance account manager Transfers: 4-To toilet/BSC: Min A (steadying Pt. > 75%);4-From toilet/BSC: Min A (steadying Pt. > 75%)  FIM - Bed/Chair Transfer Bed/Chair Transfer Assistive Devices: Arm rests;Walker Bed/Chair Transfer: 5: Supine > Sit: Supervision (verbal cues/safety issues);5: Sit > Supine: Supervision (verbal cues/safety issues);4: Bed > Chair or W/C: Min A (steadying Pt. > 75%);4: Chair or W/C > Bed: Min A (steadying Pt. > 75%)  FIM - Locomotion: Wheelchair Distance: 45 Locomotion: Wheelchair: 1: Travels less than 50 ft with minimal assistance (Pt.>75%) FIM - Locomotion: Ambulation Locomotion: Ambulation Assistive Devices: Walker - Rolling;Other (comment) (no AD) Ambulation/Gait Assistance: 3: Mod assist;4: Min assist Locomotion: Ambulation: 2: Travels 50 - 149 ft with moderate assistance (Pt: 50 - 74%)  Comprehension Comprehension Mode: Auditory Comprehension: 6-Follows complex conversation/direction: With extra time/assistive device  Expression Expression Mode: Verbal Expression: 6-Expresses complex ideas: With extra time/assistive device  Social Interaction Social Interaction: 6-Interacts appropriately with others with medication or extra time (anti-anxiety, antidepressant).  Problem Solving Problem Solving: 7-Solves complex problems: Recognizes & self-corrects  Memory Memory: 6-More than reasonable amt of time  Medical Problem List and Plan:  1. Functional deficits secondary to left thalamus, PLIC. infarct  2. DVT Prophylaxis/Anticoagulation: SCDs. Monitor for any signs of DVT. Venous Doppler studies 07/20/2014 negative  3. Pain Management: Neurontin 300 mg each bedtime, Ultram 50 mg each bedtime . Monitor with increased mobility  4. Mood/anxiety: Xanax 0.25 mg twice a day. Provide emotional support  5.  Neuropsych: This patient is capable of making decisions on her own behalf (with trranslation).  6. Skin/Wound Care: Routine  skin checks  7. History of thrombocytopenia. Latest platelet count 98,000. Monitor for any bleeding episodes  8. Diabetes mellitus with peripheral neuropathy. Hemoglobin A1c 6.0. Check blood sugars a.c. and at bedtime. Patient on Glucophage 250 mg daily prior to admission. Resume as tolerated  9. Hypertension. No current antihypertensive medication. Patient on atenolol 25 mg daily and Cardizem 15 mg daily, Lasix 20 mg daily prior to admission. Allowing permissive hypertension. Resume as needed monitor with increased mobility  10. Hyperlipidemia. Lipitor  11. Benign essential tremor. Continue Mysoline 250 mg twice a day as prior to admission    LOS (Days) 4 A FACE TO FACE EVALUATION WAS PERFORMED  Ian Cavey E 07/25/2014, 7:16 AM

## 2014-07-26 ENCOUNTER — Inpatient Hospital Stay (HOSPITAL_COMMUNITY): Payer: Medicare Other | Admitting: Occupational Therapy

## 2014-07-26 ENCOUNTER — Inpatient Hospital Stay (HOSPITAL_COMMUNITY): Payer: Self-pay | Admitting: Rehabilitation

## 2014-07-26 DIAGNOSIS — I69993 Ataxia following unspecified cerebrovascular disease: Secondary | ICD-10-CM

## 2014-07-26 DIAGNOSIS — Z5189 Encounter for other specified aftercare: Secondary | ICD-10-CM

## 2014-07-26 LAB — GLUCOSE, CAPILLARY
GLUCOSE-CAPILLARY: 89 mg/dL (ref 70–99)
GLUCOSE-CAPILLARY: 115 mg/dL — AB (ref 70–99)
Glucose-Capillary: 94 mg/dL (ref 70–99)
Glucose-Capillary: 104 mg/dL — ABNORMAL HIGH (ref 70–99)

## 2014-07-26 MED ORDER — ATENOLOL 12.5 MG HALF TABLET
12.5000 mg | ORAL_TABLET | Freq: Every day | ORAL | Status: DC
  Administered 2014-07-26 – 2014-07-28 (×3): 12.5 mg via ORAL
  Filled 2014-07-26 (×5): qty 1

## 2014-07-26 NOTE — Progress Notes (Signed)
Physical Therapy Session Note  Patient Details  Name: [name] [name] MRN: [id number] Date of Birth: [birth year]  Today's Date: 07/26/2014 PT Individual Time: 1000-1100 PT Individual Time Calculation (min): 60 min   Short Term Goals: Week 1:  PT Short Term Goal 1 (Week 1): = LTGs due to anticipated LOS  Skilled Therapeutic Interventions/Progress Updates:   Pt received sitting in arm chair in room, having just finished with OT session.  Pt working on coordination of UEs with rhythm of phone song.  Interpretor present during session to translate as needed.  Skilled session focused on activity tolerance with gait and exercise, dynamic standing balance and NMR through RUE/LE.  Pt ambulated to/from therapy gym, therapy gym>ortho gym>ADL apt with use of RW at min/guard level with cues for maintaining position inside of RW, upright posture and increased step length on RLE.  Note that more cues provided, more assist needed, therefore attempted to decrease verbal cues and provide light facilitation for weight shifting.  Worked on dynamic balance with ball toss and ball kick at min A level with cues for alternating LEs to work on RLE strength and increased weight shift and WB through RLE.  Transitioned to horse shoe toss with use of RUE to work on coordination and strength.  Also had pt retrieve horse shoes one at a time for increased quad and glute strength with balance.  Performed car transfer w/ min A and max verbal cues for keeping RW with her when turning.  Also performed 3, 6" steps in ortho gym with use of R handrail to simulate back entrance at min A level with therapist providing HHA on LUE for support.  Ended session with performing bed mobility in ADL apt to simulate home.  Pt with difficulty elevating RLE into bed and maintaining trunk flex and tends to end of diagonal on bed, but did well getting back to EOB.  Ambulated back to room without use of RW to further challenge balance at min/mod A level  with facilitation for increased weight shift, cues for upright posture and increased stride length, as well as increased glute/quad activation on RLE during R stance.  Pt left in recliner in room with all needs in reach and daughter now in room.    FYI:  Note family now stating that they cannot provide 24/7 S at time of D/C.  This PT let primary PT know.    Therapy Documentation Precautions:  Precautions Precautions: Fall Restrictions Weight Bearing Restrictions: No   Vital Signs: Therapy Vitals Pulse Rate: 66 BP: 121/48 mmHg Pain: Pt with pain in R shoulder, RN offered pain meds, however pt declined.    See FIM for current functional status  Therapy/Group: Individual Therapy  Vista Deck 07/26/2014, 10:30 AM

## 2014-07-26 NOTE — Progress Notes (Signed)
Physical Therapy Session Note  Patient Details  Name: [name] [name] MRN: [id number] Date of Birth: [birth year]  Today's Date: 07/26/2014 PT Individual Time: 1302-1402 PT Individual Time Calculation (min): 60 min   Short Term Goals: Week 1:  PT Short Term Goal 1 (Week 1): = LTGs due to anticipated LOS  Skilled Therapeutic Interventions/Progress Updates:   Pt received sitting in recliner with daughter present, agreeable to therapy. Focus on discharge planning, gait training, coordination, balance, and activity tolerance. Gait training x 150 ft using RW, supervision with vc's for forward gaze and increased R step length due to RLE shuffling, and 1 LOB requiring mod A to regain balance. Pt performed RLE and LLE taps to cone with initial HHA progressed to close supervision-min guard for greater balance challenge. Pt requires frequent seated rest breaks throughout session due to decreased endurance. Gait training throughout rehab unit in controlled and home environments with L HHA, mod A progressed to min A, multiple trials up to 150 ft. Initiated hands-on family training with patient and daughter, ambulating 90 ft using RW with min guard and 40 ft using HHA with mod A. Daughter declining to be cleared to assist patient to bathroom in room due to fear of patient falling despite demonstrating safety with assisting mother. Pt requesting to use bathroom upon returning to room. Pt ambulated to bathroom and sink with RW and supervision, then transferred supine > sit with supervision and cues for technique. Discussed d/c planning as daughter plans to leave this Friday and patient will no longer have 24/7 supervision at discharge due to son working during day. Pt left supine in bed with all needs within reach, daughter present.   Therapy Documentation Precautions:  Precautions Precautions: Fall Restrictions Weight Bearing Restrictions: No Pain: Pain Assessment Pain Assessment: No/denies pain  See FIM  for current functional status  Therapy/Group: Individual Therapy  Kerney Elbe 07/26/2014, 2:14 PM

## 2014-07-26 NOTE — Plan of Care (Signed)
Problem: RH PAIN MANAGEMENT Goal: RH STG PAIN MANAGED AT OR BELOW PT'S PAIN GOAL <3 Outcome: Progressing No c/o pain     

## 2014-07-26 NOTE — Progress Notes (Signed)
Subjective/Complaints: 80 y.o. right-handed female [name] limited English speaking who was visiting family in [place] and she experienced acute onset right-sided weakness with history of thrombocytopenia, hypertension and newly diagnosed diabetes mellitus. Patient independent prior to admission. She was transferred to [facility] at family's request. MRI of the brain showed acute lacunar infarct in the lateral left thalamus near the posterior limb of the left internal capsule. MRA of the head showed left PCA to be occluded. Echocardiogram with ejection fraction of 65% grade 1 diastolic dysfunction. Carotid Dopplers with no ICA stenosis. Patient did not receive TPA. Neurology services consulted placed on aspirin and Aggrenox for CVA prophylaxis and discontinue aspirin once Aggrenox increased to twice a day after 14 days. Complaints of left leg pain denying any recent trauma with x-ray series of left lower extremity negative for fracture.   Right eye felt blurry, right hand felt weaker yesterday pm, was harder to feed herself, feels more normal today.  Felt tired yesterday  Daughter in room with her this am Ros:  Limited by language  Objective: Vital Signs: Blood pressure 111/47, pulse 59, temperature 98.4 F (36.9 C), temperature source Oral, resp. rate 17, weight 63.8 kg (140 lb 10.5 oz), SpO2 98.00%. No results found. Results for orders placed during the hospital encounter of 07/21/14 (from the past 72 hour(s))  GLUCOSE, CAPILLARY     Status: None   Collection Time    07/23/14 11:22 AM      Result Value Ref Range   Glucose-Capillary 92  70 - 99 mg/dL  GLUCOSE, CAPILLARY     Status: None   Collection Time    07/23/14  4:32 PM      Result Value Ref Range   Glucose-Capillary 98  70 - 99 mg/dL   Comment 1 Notify RN    GLUCOSE, CAPILLARY     Status: Abnormal   Collection Time    07/23/14  9:08 PM      Result Value Ref Range   Glucose-Capillary 120 (*) 70 - 99  mg/dL  GLUCOSE, CAPILLARY     Status: None   Collection Time    07/24/14  7:12 AM      Result Value Ref Range   Glucose-Capillary 99  70 - 99 mg/dL  GLUCOSE, CAPILLARY     Status: None   Collection Time    07/24/14 11:34 AM      Result Value Ref Range   Glucose-Capillary 82  70 - 99 mg/dL   Comment 1 Notify RN    GLUCOSE, CAPILLARY     Status: None   Collection Time    07/24/14  4:25 PM      Result Value Ref Range   Glucose-Capillary 97  70 - 99 mg/dL  GLUCOSE, CAPILLARY     Status: None   Collection Time    07/24/14  8:57 PM      Result Value Ref Range   Glucose-Capillary 91  70 - 99 mg/dL  GLUCOSE, CAPILLARY     Status: None   Collection Time    07/25/14  6:48 AM      Result Value Ref Range   Glucose-Capillary 97  70 - 99 mg/dL  GLUCOSE, CAPILLARY     Status: Abnormal   Collection Time    07/25/14 11:39 AM      Result Value Ref Range   Glucose-Capillary 101 (*) 70 - 99 mg/dL  GLUCOSE, CAPILLARY     Status: Abnormal   Collection Time  07/25/14  5:01 PM      Result Value Ref Range   Glucose-Capillary 101 (*) 70 - 99 mg/dL  GLUCOSE, CAPILLARY     Status: None   Collection Time    07/25/14  9:12 PM      Result Value Ref Range   Glucose-Capillary 99  70 - 99 mg/dL  GLUCOSE, CAPILLARY     Status: None   Collection Time    07/26/14  6:56 AM      Result Value Ref Range   Glucose-Capillary 89  70 - 99 mg/dL      Physical Exam:    Physical Exam  Constitutional: She appears well-developed.  HENT:  Head: Normocephalic.  Eyes: EOM are normal. Not injected no drainage Neck: Normal range of motion. Neck supple. No thyromegaly present.  Cardiovascular: Normal rate and regular rhythm.  Respiratory: Effort normal and breath sounds normal. No respiratory distress.  GI: Soft. Bowel sounds are normal. She exhibits no distension.  Neurological: She is alert.  Patient limited English speaking. She does make good eye contact with examiner. She was able to communicate her  name, age and follows simple commands. Mild right upper extremity weakness---4/5 prox to distal. RLE: HF 3+/5, KE4-/5. ADF/APF 4-/5. Sensory testing reduced LT Right side . Normal strength on the left , visual fields intact to confrontation testing , EOMI, PERRLA, Musculoskeletal- Skin: Skin is warm and dry   Assessment/Plan: 1. Functional deficits secondary to Left thalamic and PLIC infarcts which require 3+ hours per day of interdisciplinary therapy in a comprehensive inpatient rehab setting. Physiatrist is providing close team supervision and 24 hour management of active medical problems listed below. Physiatrist and rehab team continue to assess barriers to discharge/monitor patient progress toward functional and medical goals. Episode of increased weakness RUE now resolved already on Aggrenox and ASA with plan for BID ASA in another week.  Monitor for recurrent symptoms, no ICA occlusion on MRA , did have L PCA occlsuion FIM: FIM - Bathing Bathing Steps Patient Completed: Chest;Right Arm;Left Arm;Abdomen;Front perineal area;Buttocks;Right upper leg;Left upper leg;Right lower leg (including foot);Left lower leg (including foot) Bathing: 5: Supervision: Safety issues/verbal cues  FIM - Upper Body Dressing/Undressing Upper body dressing/undressing steps patient completed: Thread/unthread right sleeve of pullover shirt/dresss;Thread/unthread left sleeve of pullover shirt/dress;Put head through opening of pull over shirt/dress;Pull shirt over trunk;Button/unbutton shirt Upper body dressing/undressing: 5: Set-up assist to: Obtain clothing/put away FIM - Lower Body Dressing/Undressing Lower body dressing/undressing steps patient completed: Thread/unthread right underwear leg;Thread/unthread left underwear leg;Pull underwear up/down;Don/Doff right sock;Don/Doff left sock;Thread/unthread right pants leg;Thread/unthread left pants leg Lower body dressing/undressing: 4: Min-Patient completed 75 plus % of  tasks  FIM - Toileting Toileting steps completed by patient: Adjust clothing prior to toileting;Performs perineal hygiene;Adjust clothing after toileting Toileting Assistive Devices: Grab bar or rail for support Toileting: 5: Supervision: Safety issues/verbal cues  FIM - Diplomatic Services operational officer Devices: Art gallery manager Transfers: 4-To toilet/BSC: Min A (steadying Pt. > 75%);4-From toilet/BSC: Min A (steadying Pt. > 75%)  FIM - Bed/Chair Transfer Bed/Chair Transfer Assistive Devices: Arm rests;Walker Bed/Chair Transfer: 4: Bed > Chair or W/C: Min A (steadying Pt. > 75%);4: Chair or W/C > Bed: Min A (steadying Pt. > 75%)  FIM - Locomotion: Wheelchair Distance: 45 Locomotion: Wheelchair: 0: Activity did not occur FIM - Locomotion: Ambulation Locomotion: Ambulation Assistive Devices: Designer, industrial/product Ambulation/Gait Assistance: 4: Min guard;5: Supervision Locomotion: Ambulation: 4: Travels 150 ft or more with minimal assistance (Pt.>75%)  Comprehension Comprehension Mode:  Auditory Comprehension: 5-Follows basic conversation/direction: With no assist  Expression Expression Mode: Verbal Expression: 5-Expresses basic needs/ideas: With no assist  Social Interaction Social Interaction: 6-Interacts appropriately with others with medication or extra time (anti-anxiety, antidepressant).  Problem Solving Problem Solving: 5-Solves basic problems: With no assist  Memory Memory: 6-More than reasonable amt of time  Medical Problem List and Plan:  1. Functional deficits secondary to left thalamus, PLIC. infarct  2. DVT Prophylaxis/Anticoagulation: SCDs. Monitor for any signs of DVT. Venous Doppler studies 07/20/2014 negative  3. Pain Management: Neurontin 300 mg each bedtime, Ultram 50 mg each bedtime . Monitor with increased mobility  4. Mood/anxiety: Xanax 0.25 mg twice a day. Provide emotional support  5. Neuropsych: This patient is capable of making decisions on her  own behalf (with trranslation).  6. Skin/Wound Care: Routine skin checks  7. History of thrombocytopenia. Latest platelet count 98,000. Monitor for any bleeding episodes  8. Diabetes mellitus with peripheral neuropathy. Hemoglobin A1c 6.0. Check blood sugars a.c. and at bedtime. Patient on Glucophage 250 mg daily prior to admission. Resume as tolerated  9. Hypertension. No current antihypertensive medication. Patient on atenolol 25 mg daily and Cardizem 15 mg daily, Lasix 20 mg daily prior to admission. BPs and HRs running low, reduce atenolol to 12.5mg . Resume as needed monitor with increased mobility  10. Hyperlipidemia. Lipitor  11. Benign essential tremor. Continue Mysoline 250 mg twice a day as prior to admission    LOS (Days) 5 A FACE TO FACE EVALUATION WAS PERFORMED  [name] [name] 07/26/2014, 7:15 AM

## 2014-07-26 NOTE — Progress Notes (Signed)
Occupational Therapy Session Note  Patient Details  Name: [name] [name] MRN: [id number] Date of Birth: 1930-09-06  Today's Date: 07/26/2014 OT Individual Time: 0902-1002 OT Individual Time Calculation (min): 60 min   Short Term Goals: Week 1:  OT Short Term Goal 1 (Week 1): short term goals = LTG secondary to short ELOS  Skilled Therapeutic Interventions/Progress Updates:  Patient resting in bed upon arrival with family and interpreter present.  Reviewed with family and patient that the therapy goals are set for overall supervision which means that initially patient will require someone to be with her 24/7.  Family continues to report that they do not have anyone who can provide that supervision.  Engaged in self care retraining to include bed mobility, gather clothes from drawers using RW, toilet transfer and toileting, shower, dress and groom.  Focused session on activity tolerance, RUE coordination, forced use of RUE, walker safety, standing tolerance, dynamic balance.  Therapy Documentation Precautions:  Precautions Precautions: Fall Restrictions Weight Bearing Restrictions: No Pain: Muscle soreness in right shoulder (upper trap area), no report of pain ADL: See FIM for current functional status  Therapy/Group: Individual Therapy  Zakery Normington 07/26/2014, 8:15 AM

## 2014-07-27 ENCOUNTER — Encounter (HOSPITAL_COMMUNITY): Payer: Self-pay | Admitting: Occupational Therapy

## 2014-07-27 ENCOUNTER — Inpatient Hospital Stay (HOSPITAL_COMMUNITY): Payer: Self-pay | Admitting: Occupational Therapy

## 2014-07-27 ENCOUNTER — Inpatient Hospital Stay (HOSPITAL_COMMUNITY): Payer: Self-pay | Admitting: Rehabilitation

## 2014-07-27 LAB — GLUCOSE, CAPILLARY
GLUCOSE-CAPILLARY: 131 mg/dL — AB (ref 70–99)
Glucose-Capillary: 97 mg/dL (ref 70–99)
Glucose-Capillary: 113 mg/dL — ABNORMAL HIGH (ref 70–99)
Glucose-Capillary: 99 mg/dL (ref 70–99)

## 2014-07-27 NOTE — Progress Notes (Signed)
Physical Therapy Session Note  Patient Details  Name: [name] [name] MRN: [id number] Date of Birth: [birth year]  Today's Date: 07/27/2014 PT Individual Time: 4098-1191 PT Individual Time Calculation (min): 31 min   Short Term Goals: Week 1:  PT Short Term Goal 1 (Week 1): = LTGs due to anticipated LOS  Skilled Therapeutic Interventions/Progress Updates:   Pt received sitting in recliner in room, daughter, son and interpretor present during session for family education and translation as needed.  Skilled session focused on gait, car transfer and stair negotiation to increase pts safety at time of D/C.  Pt ambulated to/from ortho gym at S level with RW with daughter providing close guarding throughout.  Pt still requires mod to max verbal cues from therapist and family to keep RW with her when sitting.  She had LOB when going back to room when turning to chair, therefore good use of "planned failure."  Performed car transfer at S level with cues to family and pt on safe technique.  Ended session with stair negotiation up/down 3, 6" steps without handrails to simulate home (they have 2 STE).  Educated pt and family on how to assist and with which LE to ascend/descend with.  Pt requires mod A without use of handrails, therefore feel she could do 6 steps in front of house with single handrail safer than the back of the house.  Family somewhat resistant to this idea, will continue to have them practice stairs with her this afternoon.  Pt ambulated back to room and left in recliner with all needs in reach.  Son voicing concerns about D/C date, her dizziness and adjusting BP medications.  Will discuss in conference.    Therapy Documentation Precautions:  Precautions Precautions: Fall Restrictions Weight Bearing Restrictions: No   Vital Signs: Therapy Vitals Pulse Rate: 72 BP: 111/53 mmHg Pain: Pt with no pain during session.   See FIM for current functional status  Therapy/Group: Individual  Therapy  Vista Deck 07/27/2014, 12:15 PM

## 2014-07-27 NOTE — Care Management Note (Addendum)
  Page 1 of 1   07/27/2014     10:18:17 AM CARE MANAGEMENT NOTE 07/27/2014  Patient:  [name] [name]   Account Number:  [id number]  Date Initiated:  07/21/2014  Documentation initiated by:  Adalida Garver  Subjective/Objective Assessment:   Patient was admitted with CVA.     Action/Plan:   Will follow for discharge needs pending PT/OT evals and physician orders.   Anticipated DC Date:  07/21/2014   Anticipated DC Plan:  IP REHAB FACILITY         Choice offered to / List presented to:             Status of service:  Completed, signed off Medicare Important Message given?  YES (If response is "NO", the following Medicare IM given date fields will be blank) Date Medicare IM given:  07/21/2014 Medicare IM given by:  [name] [name] Date Additional Medicare IM given:   Additional Medicare IM given by:    Discharge Disposition:  IP REHAB FACILITY  Per UR Regulation:  Reviewed for med. necessity/level of care/duration of stay  If discussed at Long Length of Stay Meetings, dates discussed:    Comments:  07/21/14 [name] Bales RN, MSN, CM- Medicare IM letter left at bedside.  Patient is non [place] speaking and no English speaking family is available at bedside.  Patient's son is currently unavailable via phone, as he teaches college courses.

## 2014-07-27 NOTE — Progress Notes (Signed)
Social Work Patient ID: [name] [name], female   DOB: 09/25/1930, 80 y.o.   MRN: [id number] Met with pt and daughter and spoke with son via telephone to inform of team conference goals-supervision level goals and discharge 9/5.  Son expressed concerns regarding her blood pressure and Medicines.  He voiced MD was to change her medicines and has not yet.  He wants to appeal this date and have more time here on rehab.  Daughter informed worker she is flying out on Friday and will be Gone a week then return.  Discussed hiring assistance for pt at home, but son does not feel she is ready to go home.  Will ask MD to speak with son and have left a note for him to contact son. Will work toward discharge and await MD input.

## 2014-07-27 NOTE — Patient Care Conference (Signed)
Inpatient RehabilitationTeam Conference and Plan of Care Update Date: 07/27/2014   Time: 11:30 AM    Patient Name: ARVILLA HOMSEY      Medical Record Number: [id number]  Date of Birth: 06-16-1930 Sex: Female         Room/Bed: 4M05C/4M05C-01 Payor Info: Payor: MEDICARE / Plan: MEDICARE PART A AND B / Product Type: *No Product type* /    Admitting Diagnosis: CVA  Admit Date/Time:  07/21/2014  3:31 PM Admission Comments: No comment available   Primary Diagnosis:  <principal problem not specified> Principal Problem: <principal problem not specified>  Patient Active Problem List   Diagnosis Date Noted  . CVA (cerebral infarction) 07/21/2014  . Left leg pain 07/20/2014  . Stroke 07/19/2014  . HTN (hypertension) 07/18/2014  . HLD (hyperlipidemia) 07/18/2014  . Acute ischemic stroke 07/18/2014  . Thrombocytopenia, unspecified 07/18/2014  . Essential tremor 10/27/2013  . Obesity, unspecified 10/27/2013  . DM2 (diabetes mellitus, type 2) 10/27/2013  . Unspecified hereditary and idiopathic peripheral neuropathy 10/27/2013    Expected Discharge Date: Expected Discharge Date: 07/30/14  Team Members Present: Physician leading conference: Dr. Claudette Laws Social Worker Present: Dossie Der, LCSW Nurse Present: Carmie End, RN PT Present: Edman Circle, PT;Emily Parcell, PT;Kazuto Sevey Evelina Bucy, PT OT Present: Perrin Maltese, Domenic Schwab, OT SLP Present: Jackalyn Lombard, SLP PPS Coordinator present : Tora Duck, RN, CRRN     Current Status/Progress Goal Weekly Team Focus  Medical   low BP  adjust meds  D/C planning   Bowel/Bladder   Continen tof bladder and bowel, LBM 07/24/14  To be remain continent of baldder and bowel  Offer toileting prn   Swallow/Nutrition/ Hydration     WFL        ADL's   close supervision with self care, Min A for tub transfers, S for grooming standing at sink side, decreased endurance  overall supervision with use of RW for occupational performance  family  education, functional transfers, R UE strengthening and FMC, and energy conservation education   Mobility   supervision with RW and min-mod A with HHA x1 for ambulation, min A stairs, supervision bed mobility and transfers  overall supervision with RW and mod I for bed mobility and transfers  activity tolerance, gait and transfer training, balance, R NMR, family education   Communication     communicates through interpreter and family        Safety/Cognition/ Behavioral Observations    no unsafe behaviors        Pain   No c/o pain  Pain <3  Assess for and treat prn with prn pain meds, Sch. Tramadol 50mg  at bedtime   Skin   CDI  No new skin breakdown   Assess skin q shift      *See Care Plan and progress notes for long and short-term goals.  Barriers to Discharge: family unrealistic with hospital stay    Possible Resolutions to Barriers:  family training    Discharge Planning/Teaching Needs:  Home to son's home with daughter who  is here to assist Mom once goes home.  She is here daily and participating in her care.      Team Discussion:  Pt making good progress in therapies but will still require 24 hr supervision.daughter flying back home on Friday unsure who will be there with pt while son working. BP issues from the weekend resolved.  Revisions to Treatment Plan:  Unsure of plan due to no caregiver after Friday   Continued Need for  Acute Rehabilitation Level of Care: The patient requires daily medical management by a physician with specialized training in physical medicine and rehabilitation for the following conditions: Daily direction of a multidisciplinary physical rehabilitation program to ensure safe treatment while eliciting the highest outcome that is of practical value to the patient.: Yes Daily medical management of patient stability for increased activity during participation in an intensive rehabilitation regime.: Yes Daily analysis of laboratory values and/or  radiology reports with any subsequent need for medication adjustment of medical intervention for : Neurological problems;Other  Luqman Perrelli, Lemar Livings 07/27/2014, 1:45 PM

## 2014-07-27 NOTE — Progress Notes (Signed)
Subjective/Complaints: 80 y.o. right-handed female ([name]) limited English speaking who was visiting family in [place] and she experienced acute onset right-sided weakness with history of thrombocytopenia, hypertension and newly diagnosed diabetes mellitus. Patient independent prior to admission. She was transferred to [facility] at family's request. MRI of the brain showed acute lacunar infarct in the lateral left thalamus near the posterior limb of the left internal capsule. MRA of the head showed left PCA to be occluded. Echocardiogram with ejection fraction of 32% grade 1 diastolic dysfunction. Carotid Dopplers with no ICA stenosis. Patient did not receive TPA. Neurology services consulted placed on aspirin and Aggrenox for CVA prophylaxis and discontinue aspirin once Aggrenox increased to twice a day after 14 days. Complaints of left leg pain denying any recent trauma with x-ray series of left lower extremity negative for fracture.   R leg "heavy" No pain c/os Ros:  Limited by language  Objective: Vital Signs: Blood pressure 113/48, pulse 64, temperature 98.6 F (37 C), temperature source Oral, resp. rate 18, weight 63.8 kg (140 lb 10.5 oz), SpO2 96.00%. No results found. Results for orders placed during the hospital encounter of 07/21/14 (from the past 72 hour(s))  GLUCOSE, CAPILLARY     Status: None   Collection Time    07/24/14  7:12 AM      Result Value Ref Range   Glucose-Capillary 99  70 - 99 mg/dL  GLUCOSE, CAPILLARY     Status: None   Collection Time    07/24/14 11:34 AM      Result Value Ref Range   Glucose-Capillary 82  70 - 99 mg/dL   Comment 1 Notify RN    GLUCOSE, CAPILLARY     Status: None   Collection Time    07/24/14  4:25 PM      Result Value Ref Range   Glucose-Capillary 97  70 - 99 mg/dL  GLUCOSE, CAPILLARY     Status: None   Collection Time    07/24/14  8:57 PM      Result Value Ref Range   Glucose-Capillary 91  70 - 99 mg/dL   GLUCOSE, CAPILLARY     Status: None   Collection Time    07/25/14  6:48 AM      Result Value Ref Range   Glucose-Capillary 97  70 - 99 mg/dL  GLUCOSE, CAPILLARY     Status: Abnormal   Collection Time    07/25/14 11:39 AM      Result Value Ref Range   Glucose-Capillary 101 (*) 70 - 99 mg/dL  GLUCOSE, CAPILLARY     Status: Abnormal   Collection Time    07/25/14  5:01 PM      Result Value Ref Range   Glucose-Capillary 101 (*) 70 - 99 mg/dL  GLUCOSE, CAPILLARY     Status: None   Collection Time    07/25/14  9:12 PM      Result Value Ref Range   Glucose-Capillary 99  70 - 99 mg/dL  GLUCOSE, CAPILLARY     Status: None   Collection Time    07/26/14  6:56 AM      Result Value Ref Range   Glucose-Capillary 89  70 - 99 mg/dL  GLUCOSE, CAPILLARY     Status: None   Collection Time    07/26/14 11:45 AM      Result Value Ref Range   Glucose-Capillary 94  70 - 99 mg/dL  GLUCOSE, CAPILLARY     Status: Abnormal  Collection Time    07/26/14  4:23 PM      Result Value Ref Range   Glucose-Capillary 115 (*) 70 - 99 mg/dL  GLUCOSE, CAPILLARY     Status: Abnormal   Collection Time    07/26/14  9:22 PM      Result Value Ref Range   Glucose-Capillary 104 (*) 70 - 99 mg/dL  GLUCOSE, CAPILLARY     Status: None   Collection Time    07/27/14  6:42 AM      Result Value Ref Range   Glucose-Capillary 97  70 - 99 mg/dL      Physical Exam:    Physical Exam  Constitutional: She appears well-developed.  HENT:  Head: Normocephalic.  Eyes: EOM are normal. Not injected no drainage Neck: Normal range of motion. Neck supple. No thyromegaly present.  Cardiovascular: Normal rate and regular rhythm.  Respiratory: Effort normal and breath sounds normal. No respiratory distress.  GI: Soft. Bowel sounds are normal. She exhibits no distension.  Neurological: She is alert.  Patient limited English speaking. She does make good eye contact with examiner. She was able to communicate her name, age and  follows simple commands. Mild right upper extremity weakness---4/5 prox to distal. RLE: HF 3+/5, KE4-/5. ADF/APF 4-/5. Sensory testing reduced LT Right side . Normal strength on the left , visual fields intact to confrontation testing , EOMI, PERRLA, Musculoskeletal- Skin: Skin is warm and dry   Assessment/Plan: 1. Functional deficits secondary to Left thalamic and PLIC infarcts which require 3+ hours per day of interdisciplinary therapy in a comprehensive inpatient rehab setting. Physiatrist is providing close team supervision and 24 hour management of active medical problems listed below. Physiatrist and rehab team continue to assess barriers to discharge/monitor patient progress toward functional and medical goals. Team conference today please see physician documentation under team conference tab, met with team face-to-face to discuss problems,progress, and goals. Formulized individual treatment plan based on medical history, underlying problem and comorbidities. FIM: FIM - Bathing Bathing Steps Patient Completed: Chest;Right Arm;Left Arm;Abdomen;Front perineal area;Buttocks;Right upper leg;Left upper leg;Right lower leg (including foot);Left lower leg (including foot) Bathing: 5: Supervision: Safety issues/verbal cues  FIM - Upper Body Dressing/Undressing Upper body dressing/undressing steps patient completed: Thread/unthread right sleeve of pullover shirt/dresss;Thread/unthread left sleeve of pullover shirt/dress;Put head through opening of pull over shirt/dress;Pull shirt over trunk;Button/unbutton shirt (pullover dress) Upper body dressing/undressing: 5: Set-up assist to: Obtain clothing/put away FIM - Lower Body Dressing/Undressing Lower body dressing/undressing steps patient completed: Thread/unthread right underwear leg;Thread/unthread left underwear leg;Pull underwear up/down;Don/Doff right sock;Don/Doff left sock;Don/Doff right shoe;Don/Doff left shoe;Fasten/unfasten right  shoe;Fasten/unfasten left shoe Lower body dressing/undressing: 5: Supervision: Safety issues/verbal cues  FIM - Toileting Toileting steps completed by patient: Adjust clothing prior to toileting;Performs perineal hygiene;Adjust clothing after toileting Toileting Assistive Devices: Grab bar or rail for support Toileting: 5: Supervision: Safety issues/verbal cues  FIM - Radio producer Devices: Insurance account manager Transfers: 5-To toilet/BSC: Supervision (verbal cues/safety issues);5-From toilet/BSC: Supervision (verbal cues/safety issues)  FIM - Engineer, site Assistive Devices: Arm rests;Walker Bed/Chair Transfer: 5: Chair or W/C > Bed: Supervision (verbal cues/safety issues);5: Sit > Supine: Supervision (verbal cues/safety issues);5: Bed > Chair or W/C: Supervision (verbal cues/safety issues)  FIM - Locomotion: Wheelchair Distance: 45 Locomotion: Wheelchair: 0: Activity did not occur FIM - Locomotion: Ambulation Locomotion: Ambulation Assistive Devices: Walker - Rolling;Other (comment) (L HHA) Ambulation/Gait Assistance: 5: Supervision;4: Min assist Locomotion: Ambulation: 4: Travels 150 ft or more with minimal assistance (Pt.>75%)  Comprehension Comprehension Mode: Auditory Comprehension: 5-Follows basic conversation/direction: With no assist  Expression Expression Mode: Verbal Expression: 5-Expresses basic needs/ideas: With extra time/assistive device  Social Interaction Social Interaction: 6-Interacts appropriately with others with medication or extra time (anti-anxiety, antidepressant).  Problem Solving Problem Solving: 5-Solves complex 90% of the time/cues < 10% of the time  Memory Memory: 6-More than reasonable amt of time  Medical Problem List and Plan:  1. Functional deficits secondary to left thalamus, PLIC. Infarct- start Aggrenox BID next wk 2. DVT Prophylaxis/Anticoagulation: SCDs. Monitor for any signs of DVT. Venous  Doppler studies 07/20/2014 negative  3. Pain Management: Neurontin 300 mg each bedtime, Ultram 50 mg each bedtime . Monitor with increased mobility  4. Mood/anxiety: Xanax 0.25 mg twice a day. Provide emotional support  5. Neuropsych: This patient is capable of making decisions on her own behalf (with trranslation).  6. Skin/Wound Care: Routine skin checks  7. History of thrombocytopenia. Latest platelet count 98,000. Monitor for any bleeding episodes  8. Diabetes mellitus with peripheral neuropathy. Hemoglobin A1c 6.0. Check blood sugars a.c. and at bedtime. Patient on Glucophage 250 mg daily prior to admission. Resume as tolerated  9. Hypertension. No current antihypertensive medication. Patient on atenolol 25 mg daily and Cardizem 15 mg daily, Lasix 20 mg daily prior to admission. BPs and HRs running low, reduce atenolol to 12.27m. Resume as needed monitor with increased mobility  10. Hyperlipidemia. Lipitor  11. Benign essential tremor. Continue Mysoline 250 mg twice a day as prior to admission    LOS (Days) 6 A FACE TO FACE EVALUATION WAS PERFORMED  Shalin Linders E 07/27/2014, 7:10 AM

## 2014-07-27 NOTE — Progress Notes (Signed)
Social Work Lucy Chris, LCSW Social Worker Signed  Patient Care Conference Service date: 07/27/2014 1:44 PM  Inpatient RehabilitationTeam Conference and Plan of Care Update Date: 07/27/2014   Time: 11:30 AM     Patient Name: [name] [name]       Medical Record Number: [id number]   Date of Birth: October 06, 1930 Sex: Female         Room/Bed: 4M05C/4M05C-01 Payor Info: Payor: MEDICARE / Plan: MEDICARE PART A AND B / Product Type: *No Product type* /   Admitting Diagnosis: CVA   Admit Date/Time:  07/21/2014  3:31 PM Admission Comments: No comment available   Primary Diagnosis:  <principal problem not specified> Principal Problem: <principal problem not specified>    Patient Active Problem List     Diagnosis  Date Noted   .  CVA (cerebral infarction)  07/21/2014   .  Left leg pain  07/20/2014   .  Stroke  07/19/2014   .  HTN (hypertension)  07/18/2014   .  HLD (hyperlipidemia)  07/18/2014   .  Acute ischemic stroke  07/18/2014   .  Thrombocytopenia, unspecified  07/18/2014   .  Essential tremor  10/27/2013   .  Obesity, unspecified  10/27/2013   .  DM2 (diabetes mellitus, type 2)  10/27/2013   .  Unspecified hereditary and idiopathic peripheral neuropathy  10/27/2013     Expected Discharge Date: Expected Discharge Date: 07/30/14  Team Members Present: Physician leading conference: Dr. Claudette Laws Social Worker Present: Dossie Der, LCSW Nurse Present: Carmie End, RN PT Present: Edman Circle, PT;Emily Parcell, PT;Kayd Launer Evelina Bucy, PT OT Present: Perrin Maltese, Domenic Schwab, OT SLP Present: Jackalyn Lombard, SLP PPS Coordinator present : Tora Duck, RN, CRRN        Current Status/Progress  Goal  Weekly Team Focus   Medical     low BP  adjust meds  D/C planning   Bowel/Bladder     Continen tof bladder and bowel, LBM 07/24/14  To be remain continent of baldder and bowel  Offer toileting prn   Swallow/Nutrition/ Hydration     WFL       ADL's     close supervision with  self care, Min A for tub transfers, S for grooming standing at sink side, decreased endurance  overall supervision with use of RW for occupational performance  family education, functional transfers, R UE strengthening and FMC, and energy conservation education   Mobility     supervision with RW and min-mod A with HHA x1 for ambulation, min A stairs, supervision bed mobility and transfers  overall supervision with RW and mod I for bed mobility and transfers  activity tolerance, gait and transfer training, balance, R NMR, family education   Communication     communicates through interpreter and family       Safety/Cognition/ Behavioral Observations    no unsafe behaviors       Pain     No c/o pain  Pain <3  Assess for and treat prn with prn pain meds, Sch. Tramadol 50mg  at bedtime   Skin     CDI  No new skin breakdown   Assess skin q shift     *See Care Plan and progress notes for long and short-term goals.    Barriers to Discharge:  family unrealistic with hospital stay      Possible Resolutions to Barriers:    family training      Discharge Planning/Teaching Needs:    Home to  son's home with daughter who  is here to assist Mom once goes home.  She is here daily and participating in her care.      Team Discussion:    Pt making good progress in therapies but will still require 24 hr supervision.daughter flying back home on Friday unsure who will be there with pt while son working. BP issues from the weekend resolved.   Revisions to Treatment Plan:    Unsure of plan due to no caregiver after Friday    Continued Need for Acute Rehabilitation Level of Care: The patient requires daily medical management by a physician with specialized training in physical medicine and rehabilitation for the following conditions: Daily direction of a multidisciplinary physical rehabilitation program to ensure safe treatment while eliciting the highest outcome that is of practical value to the patient.:  Yes Daily medical management of patient stability for increased activity during participation in an intensive rehabilitation regime.: Yes Daily analysis of laboratory values and/or radiology reports with any subsequent need for medication adjustment of medical intervention for : Neurological problems;Other  Lucy Chris 07/27/2014, 1:45 PM          Patient ID: [name] [name], female   DOB: [birth year], 80 y.o.   MRN: [id number]

## 2014-07-27 NOTE — Progress Notes (Signed)
Occupational Therapy Session Note  Patient Details  Name: [name] [name] MRN: [id number] Date of Birth: Dec 16, 1929  Today's Date: 07/27/2014 OT Individual Time: 7829-5621 and 1325-1430 OT Individual Time Calculation (min): 45 min and 65 min   Short Term Goals: Week 1:  OT Short Term Goal 1 (Week 1): short term goals = LTG secondary to short ELOS  Skilled Therapeutic Interventions/Progress Updates:    Session 1 [phone]): Pt with no c/o pain this session but pt does have increased fatigue and reports per interpreter, "My right arm and leg just feel heavier. I am tired." interpreter present during entire session as well as son and daughter present at times this session. Pt utilized RW to obtain clothing and needed items for ADL session with close supervision for safety. Pt performed bathing seated on shower chair with supervision. Pt requiring frequent rest breaks secondary to fatigue. Pt observed to be switching washcloth back and forth from R to L  hand secondary to fatigue. Pt performed toilet transfer with standard toilet and RW with close supervision. Pt requiring verbal cues for safety with ambulation such as ," Stay within the walker." Pt standing at sink for grooming task ~ 3 minutes to brush teeth and comb hair. OT encouraged pursed lip breathing and resting for fatigue. Pt seated in recliner chair with tray table in front of her, call bell , and all needed items with in reach. Patients son and daughter continue to be present in the room upon exiting.   Session 2 [phone]) : Pt with no c/o pain this session. Pt continued to report feeling tired this session. Daughter present during session. Pt engaged in buttoning/unbuttoning shirt with 8 buttons and increased time while utilizing B hands for tasks. Pt demonstrated the ability to tie/untie shoe laces with B hands in order to increase [facility] for functional tasks. Pt engaged in 2 sets of 10 [facility] exercises of rapid opposition, finger isolation,  fists, and finger abduction/adduction with R hand. OT encouraged pt to engage in crocheting, materials present in room, as leisure activity in order to increase [facility] on R hand. Pt seated in recliner with call bell and all needed items within reach upon exiting the room.   Therapy Documentation Precautions:  Precautions Precautions: Fall Restrictions Weight Bearing Restrictions: No  See FIM for current functional status  Therapy/Group: Individual Therapy  Lowella Grip 07/27/2014, 10:41 AM

## 2014-07-27 NOTE — Progress Notes (Signed)
Physical Therapy Session Note  Patient Details  Name: [name] [name] MRN: [id number] Date of Birth: 21-Dec-1929  Today's Date: 07/27/2014 PT Individual Time: 1430-1515 PT Individual Time Calculation (min): 45 min   Short Term Goals: Week 1:  PT Short Term Goal 1 (Week 1): = LTGs due to anticipated LOS  Skilled Therapeutic Interventions/Progress Updates:   Pt received sitting in recliner in room, daughter present for family training and observation.  Skilled session focused on gait with daughter, stair negotiation with daughter, HEP for strengthening and balance and discussion for safety at home.  Performed gait x 150' x 2 reps throughout session with RW at close S level with daughter guarding pt for safety.  Min cues for upright posture throughout.  Performed stairs x 2, 6" steps without use of rails to simulate back entrance at home.  Daughter performed with daughter at mod A level with continued cues for safe handling and communication between pt and daughter throughout.  Continue to feel that she would be safer doing stairs in front if they have ability to pull close to stairs.  Daughter verbalized understanding, however may need to go over again with interpreter.  Performed seated LAQ's x 10 reps, standing knee flex x 10 reps BLE, hip abd x 10 reps BLE, and heel raises x 10 reps with seated rest breaks in between.  During rest breaks discussed use of bedside commode for safety, removal of throw rugs and need to perform all transitional movements slow to adjust for BP.  Pt and daughter verbalized understanding.  Pt ambulated back to room and pt stating she needs to use restroom.  Had daughter assist pt into restroom at close S level.  Encouraged her to continue doing this and informed nurse tech of this.  Pt left in bed with all needs in reach.   Therapy Documentation Precautions:  Precautions Precautions: Fall Restrictions Weight Bearing Restrictions: No   Vital Signs: Therapy  Vitals Temp: 97.8 F (36.6 C) Temp src: Oral Pulse Rate: 67 Resp: 18 BP: 127/51 mmHg Patient Position (if appropriate): Sitting Oxygen Therapy SpO2: 96 % O2 Device: None (Room air) Pain: No stated pain or dizziness during session.      See FIM for current functional status  Therapy/Group: Individual Therapy  Vista Deck 07/27/2014, 3:15 PM

## 2014-07-27 NOTE — Progress Notes (Signed)
Social Work Patient ID: [name] [name], female   DOB: 08-Mar-1930, 80 y.o.   MRN: [id number] Spoke with son who wants to talk with MD and proceed from there for pt's discharge.  He feels there are some medical tests she needs before discharging. MD aware of son wanting to speak with him.

## 2014-07-28 ENCOUNTER — Inpatient Hospital Stay (HOSPITAL_COMMUNITY): Payer: Self-pay | Admitting: Rehabilitation

## 2014-07-28 ENCOUNTER — Encounter (HOSPITAL_COMMUNITY): Payer: Self-pay | Admitting: Occupational Therapy

## 2014-07-28 DIAGNOSIS — Z5189 Encounter for other specified aftercare: Secondary | ICD-10-CM

## 2014-07-28 DIAGNOSIS — I69993 Ataxia following unspecified cerebrovascular disease: Secondary | ICD-10-CM

## 2014-07-28 LAB — GLUCOSE, CAPILLARY
GLUCOSE-CAPILLARY: 114 mg/dL — AB (ref 70–99)
Glucose-Capillary: 93 mg/dL (ref 70–99)
Glucose-Capillary: 107 mg/dL — ABNORMAL HIGH (ref 70–99)
Glucose-Capillary: 110 mg/dL — ABNORMAL HIGH (ref 70–99)

## 2014-07-28 LAB — CBC
HEMATOCRIT: 44.1 % (ref 36.0–46.0)
HEMOGLOBIN: 14.5 g/dL (ref 12.0–15.0)
MCH: 30.6 pg (ref 26.0–34.0)
MCHC: 32.9 g/dL (ref 30.0–36.0)
MCV: 93 fL (ref 78.0–100.0)
Platelets: 161 10*3/uL (ref 150–400)
RBC: 4.74 MIL/uL (ref 3.87–5.11)
RDW: 13.6 % (ref 11.5–15.5)
WBC: 4.3 10*3/uL (ref 4.0–10.5)

## 2014-07-28 MED ORDER — TRIAMCINOLONE 0.1 % CREAM:EUCERIN CREAM 1:1
TOPICAL_CREAM | Freq: Three times a day (TID) | CUTANEOUS | Status: DC
  Administered 2014-07-28 – 2014-07-29 (×4): via TOPICAL
  Administered 2014-07-30: 1 via TOPICAL
  Filled 2014-07-28: qty 1

## 2014-07-28 NOTE — Progress Notes (Signed)
Physical Therapy Session Note  Patient Details  Name: [name] [name] MRN: [id number] Date of Birth: Jul 11, 1930  Today's Date: 07/28/2014 PT Individual Time: 1100-1200 PT Individual Time Calculation (min): 60 min  and 1330-1430 (60 mins)  Short Term Goals: Week 1:  PT Short Term Goal 1 (Week 1): = LTGs due to anticipated LOS  Skilled Therapeutic Interventions/Progress Updates:   AM session: Pt received sitting in recliner in room, daughter present during session for family education and also for observation of HEP. Pt ambulated to therapy gym with RW at S level with daughter providing guarding for safety.  Performed negotiation up/down 6, 6" steps with single handrail to simulate home environment.  Requires min A (HHA) when going up and had pt turn sideways to descend with min A at hips for steadying assist.  Pt prefers to go down sideways for increased support.  Performed once with therapist and then also with daughter.  Daughter return demo very well.  Performed seated nustep x 8 mins at level 4 resistance with BUE/LEs for increased overall strengthening and endurance.  Pt tolerated well with only one rest break.  Performed OTAGO HEP for fall prevention, see pts handouts for details.  Daughter able to read [place] and assist pt.  Educated to use countertop or table for UE support.  Ended session with high level gait/balance with forwards gait, backwards gait and side stepping.  Pt requires min/mod A for task and several rest breaks due to increased anxiety with task.  Pt ambulated forwards without AD back to room (min A HHA) and left with daughter to ambulate to/from restroom at S level. Left in recliner with all needs in reach.   PM session: Pt received sitting in recliner in room, daughter present during session.  Skilled session focused on community level ambulation to challenge gait quality, balance and attention in busy areas and tight spaces.  Prior to ambulation downstairs, pts daughter  wanting this therapist to speak with pts granddaughter in order to discuss concerns with D/C date.  Discussed that she is at goal level at this point, and that even with another week of therapy, would continue to recommend S and therefore insurance would not cover.  Also discussed that upon initial evaluation, family was told LOS would be one week and that goals would be S level, and that family agreed.  Performed all ambulation downstairs at S level. Also performed furniture transfers, ambulation in gift shop, around [place], etc, all at S level with min cues for wider steps and upright posture.  Returned to unit and performed seated nustep x 8 mins with BLEs/LEs to increase overall strengthening, endurance and NMR for RUE/LE.  Pt tolerated well without rest breaks this session.  Pt did require lengthy leg rest following task.  While resting, discussed removal of throw rugs at home.  Daughter states that she plans to do so tonight.  Ambulated back to room with HHA without device at min/guard A with cues for upright posture and increased stride length.  Pt ambulated to restroom, managed all clothing/peri care at S level.  Returned to bed and left with all needs in reach.    Therapy Documentation Precautions:  Precautions Precautions: Fall Restrictions Weight Bearing Restrictions: No   Pain: No reports of pain during session.    See FIM for current functional status  Therapy/Group: Individual Therapy  Vista Deck 07/28/2014, 10:55 AM

## 2014-07-28 NOTE — Progress Notes (Signed)
Spoke with the patient's son regarding family's concern about Saturday, 07/30/2014 discharge date. He communicated that family was wondering about TEE prior to discharge. Patient has had negative transthoracic echo. Discussed with neurology Dr Pearlean Brownie, states patient is not an anticoagulation candidate at the current time secondary to fall risk. Once mobility improves these can be reconsidered. He plans to see the patient as an outpatient in one month. I do not anticipate fall risk improving in the near future and it is possible that the patient will never regain normal balance.  Attempted to call family member who was in the medical field left 2 messages.

## 2014-07-28 NOTE — Progress Notes (Signed)
Occupational Therapy Session Note  Patient Details  Name: [name] [name] MRN: [id number] Date of Birth: [birth year]  Today's Date: 07/28/2014 OT Individual Time: 8295-6213 OT Individual Time Calculation (min): 60 min    Short Term Goals: Week 1:  OT Short Term Goal 1 (Week 1): short term goals = LTG secondary to short ELOS  Skilled Therapeutic Interventions/Progress Updates:  Pt with no c/o pain this session. Interpreter present as well as her daughter towards later part of session. Pt reports, "I am tired." OT session with focus on pt education, energy conservation, R UE use for functional tasks, and R FMC exercises. Pt performed bathing and dressing session with close supervision for safety. Pt demonstrating her ability to obtain clothing with use of RW and use of R hand to open dresser and obtain clothing from within. Pt observed to be switching wash clothing back and forth between R and L hand secondary to fatigue per pt report. Pt performed grooming while standing at sink side with supervision for safety. Once seated, OT continued to discuss functional/routine [facility] tasks. Pt demonstrated the ability to button/unbutton small shirt buttons and tie bilateral shoes. OT also continued to discuss her use of crotchet as [facility] tasks for R hand. OT demonstrated ability to engage in activity with increased time.   Therapy Documentation Precautions:  Precautions Precautions: Fall Restrictions Weight Bearing Restrictions: No  See FIM for current functional status  Therapy/Group: Individual Therapy  Lowella Grip 07/28/2014, 4:02 PM

## 2014-07-28 NOTE — Progress Notes (Signed)
Social Work Patient ID: [name] [name], female   DOB: [birth year], 80 y.o.   MRN: [id number] Son aware to contact medicare appeals if he feels this is warranted.  Received contact from [employer] appeals to inform son had contacted to set the appeal process in motion. Will fax medical records to [facility].  Pt set for discharge on Sat 9/5.  She will require 24 hr supervision.

## 2014-07-28 NOTE — Progress Notes (Signed)
Subjective/Complaints: 80 y.o. right-handed female [name] limited English speaking who was visiting family in [place] and she experienced acute onset right-sided weakness with history of thrombocytopenia, hypertension and newly diagnosed diabetes mellitus. Patient independent prior to admission. She was transferred to [facility] at family's request. MRI of the brain showed acute lacunar infarct in the lateral left thalamus near the posterior limb of the left internal capsule. MRA of the head showed left PCA to be occluded. Echocardiogram with ejection fraction of 65% grade 1 diastolic dysfunction. Carotid Dopplers with no ICA stenosis. Patient did not receive TPA. Neurology services consulted placed on aspirin and Aggrenox for CVA prophylaxis and discontinue aspirin once Aggrenox increased to twice a day after 14 days. Complaints of left leg pain denying any recent trauma with x-ray series of left lower extremity negative for fracture.   R leg weak No pain c/os Ros:  Limited by language  Objective: Vital Signs: Blood pressure 124/64, pulse 58, temperature 97.9 F (36.6 C), temperature source Oral, resp. rate 18, weight 69.4 kg (153 lb), SpO2 95.00%. No results found. Results for orders placed during the hospital encounter of 07/21/14 (from the past 72 hour(s))  GLUCOSE, CAPILLARY     Status: Abnormal   Collection Time    07/25/14 11:39 AM      Result Value Ref Range   Glucose-Capillary 101 (*) 70 - 99 mg/dL  GLUCOSE, CAPILLARY     Status: Abnormal   Collection Time    07/25/14  5:01 PM      Result Value Ref Range   Glucose-Capillary 101 (*) 70 - 99 mg/dL  GLUCOSE, CAPILLARY     Status: None   Collection Time    07/25/14  9:12 PM      Result Value Ref Range   Glucose-Capillary 99  70 - 99 mg/dL  GLUCOSE, CAPILLARY     Status: None   Collection Time    07/26/14  6:56 AM      Result Value Ref Range   Glucose-Capillary 89  70 - 99 mg/dL  GLUCOSE, CAPILLARY      Status: None   Collection Time    07/26/14 11:45 AM      Result Value Ref Range   Glucose-Capillary 94  70 - 99 mg/dL  GLUCOSE, CAPILLARY     Status: Abnormal   Collection Time    07/26/14  4:23 PM      Result Value Ref Range   Glucose-Capillary 115 (*) 70 - 99 mg/dL  GLUCOSE, CAPILLARY     Status: Abnormal   Collection Time    07/26/14  9:22 PM      Result Value Ref Range   Glucose-Capillary 104 (*) 70 - 99 mg/dL  GLUCOSE, CAPILLARY     Status: None   Collection Time    07/27/14  6:42 AM      Result Value Ref Range   Glucose-Capillary 97  70 - 99 mg/dL  GLUCOSE, CAPILLARY     Status: Abnormal   Collection Time    07/27/14 11:40 AM      Result Value Ref Range   Glucose-Capillary 131 (*) 70 - 99 mg/dL  GLUCOSE, CAPILLARY     Status: Abnormal   Collection Time    07/27/14  5:05 PM      Result Value Ref Range   Glucose-Capillary 113 (*) 70 - 99 mg/dL  GLUCOSE, CAPILLARY     Status: None   Collection Time    07/27/14 10:06 PM  Result Value Ref Range   Glucose-Capillary 99  70 - 99 mg/dL  GLUCOSE, CAPILLARY     Status: None   Collection Time    07/28/14  6:54 AM      Result Value Ref Range   Glucose-Capillary 93  70 - 99 mg/dL      Physical Exam:    Physical Exam  Constitutional: She appears well-developed.  HENT:  Head: Normocephalic.  Eyes: EOM are normal. Not injected no drainage Neck: Normal range of motion. Neck supple. No thyromegaly present.  Cardiovascular: Normal rate and regular rhythm.  Respiratory: Effort normal and breath sounds normal. No respiratory distress.  GI: Soft. Bowel sounds are normal. She exhibits no distension.  Neurological: She is alert.  Patient limited English speaking. She does make good eye contact with examiner. She was able to communicate her name, age and follows simple commands. Mild right upper extremity weakness---4/5 prox to distal. RLE: HF 3+/5, KE4-/5. ADF/APF 4-/5. Sensory testing reduced LT Right side . Normal  strength on the left ,  , EOMI, PERRLA, Musculoskeletal-no pain with AROM R hsoulder and RLE Skin: Skin is warm and dry   Assessment/Plan: 1. Functional deficits secondary to Left thalamic and PLIC infarcts which require 3+ hours per day of interdisciplinary therapy in a comprehensive inpatient rehab setting. Physiatrist is providing close team supervision and 24 hour management of active medical problems listed below. Physiatrist and rehab team continue to assess barriers to discharge/monitor patient progress toward functional and medical goals. Medically stable at sup level for therapy.  Family concerned about Saturday discharge, they feel it is "too early" FIM: FIM - Bathing Bathing Steps Patient Completed: Chest;Right Arm;Left Arm;Abdomen;Front perineal area;Buttocks;Right upper leg;Left upper leg;Right lower leg (including foot);Left lower leg (including foot) Bathing: 5: Supervision: Safety issues/verbal cues  FIM - Upper Body Dressing/Undressing Upper body dressing/undressing steps patient completed: Thread/unthread right sleeve of pullover shirt/dresss;Thread/unthread left sleeve of pullover shirt/dress;Put head through opening of pull over shirt/dress;Pull shirt over trunk;Button/unbutton shirt Upper body dressing/undressing: 5: Supervision: Safety issues/verbal cues FIM - Lower Body Dressing/Undressing Lower body dressing/undressing steps patient completed: Thread/unthread right underwear leg;Thread/unthread left underwear leg;Pull underwear up/down;Don/Doff right sock;Don/Doff left sock;Don/Doff right shoe;Don/Doff left shoe;Fasten/unfasten right shoe;Fasten/unfasten left shoe (wearing dress with shoes,socks, and underwear) Lower body dressing/undressing: 5: Supervision: Safety issues/verbal cues  FIM - Toileting Toileting steps completed by patient: Adjust clothing prior to toileting;Performs perineal hygiene;Adjust clothing after toileting Toileting Assistive Devices: Grab bar or  rail for support Toileting: 4: Steadying assist  FIM - Diplomatic Services operational officer Devices: Art gallery manager Transfers: 5-To toilet/BSC: Supervision (verbal cues/safety issues);5-From toilet/BSC: Supervision (verbal cues/safety issues)  FIM - Press photographer Assistive Devices: Arm rests;Walker Bed/Chair Transfer: 5: Chair or W/C > Bed: Supervision (verbal cues/safety issues);5: Sit > Supine: Supervision (verbal cues/safety issues);5: Bed > Chair or W/C: Supervision (verbal cues/safety issues)  FIM - Locomotion: Wheelchair Distance: 45 Locomotion: Wheelchair: 0: Activity did not occur FIM - Locomotion: Ambulation Locomotion: Ambulation Assistive Devices: Walker - Rolling;Other (comment) (L HHA) Ambulation/Gait Assistance: 5: Supervision;4: Min assist Locomotion: Ambulation: 4: Travels 150 ft or more with minimal assistance (Pt.>75%)  Comprehension Comprehension Mode: Auditory Comprehension: 5-Follows basic conversation/direction: With no assist  Expression Expression Mode: Verbal Expression: 5-Expresses basic needs/ideas: With extra time/assistive device  Social Interaction Social Interaction: 6-Interacts appropriately with others with medication or extra time (anti-anxiety, antidepressant).  Problem Solving Problem Solving: 5-Solves complex 90% of the time/cues < 10% of the time  Memory Memory: 6-More than reasonable  amt of time  Medical Problem List and Plan:  1. Functional deficits secondary to left thalamus, PLIC. Infarct- start Aggrenox BID next wk 2. DVT Prophylaxis/Anticoagulation: SCDs. Monitor for any signs of DVT. Venous Doppler studies 07/20/2014 negative  3. Pain Management: Neurontin 300 mg each bedtime, Ultram 50 mg each bedtime . Monitor with increased mobility  4. Mood/anxiety: Xanax 0.25 mg twice a day. Provide emotional support  5. Neuropsych: This patient is capable of making decisions on her own behalf (with trranslation).   6. Skin/Wound Care: Routine skin checks  7. History of thrombocytopenia. Latest platelet count 98,000. no bleeding episodes will recheck CBC 8. Diabetes mellitus with peripheral neuropathy. Hemoglobin A1c 6.0. Check blood sugars a.c. and at bedtime. Patient on Glucophage 250 mg daily prior to admission. Resume as tolerated  9. Hypertension. No current antihypertensive medication. Patient on atenolol 25 mg daily and Cardizem 15 mg daily, Lasix 20 mg daily prior to admission. BPs and HRs in acceptable range, reduce atenolol to 12.5mg . Resume as needed monitor with increased mobility  10. Hyperlipidemia. Lipitor  11. Benign essential tremor. Continue Mysoline 250 mg twice a day as prior to admission    LOS (Days) 7 A FACE TO FACE EVALUATION WAS PERFORMED  KIRSTEINS,ANDREW E 07/28/2014, 7:17 AM

## 2014-07-29 ENCOUNTER — Inpatient Hospital Stay (HOSPITAL_COMMUNITY): Payer: Self-pay | Admitting: Rehabilitation

## 2014-07-29 ENCOUNTER — Inpatient Hospital Stay (HOSPITAL_COMMUNITY): Payer: Self-pay | Admitting: Occupational Therapy

## 2014-07-29 ENCOUNTER — Encounter (HOSPITAL_COMMUNITY): Payer: Self-pay | Admitting: Occupational Therapy

## 2014-07-29 LAB — GLUCOSE, CAPILLARY
GLUCOSE-CAPILLARY: 100 mg/dL — AB (ref 70–99)
GLUCOSE-CAPILLARY: 93 mg/dL (ref 70–99)
Glucose-Capillary: 165 mg/dL — ABNORMAL HIGH (ref 70–99)
Glucose-Capillary: 99 mg/dL (ref 70–99)

## 2014-07-29 MED ORDER — ASPIRIN 81 MG PO TABS: 81.0000 mg | ORAL_TABLET | Freq: Every day | ORAL | Status: DC

## 2014-07-29 MED ORDER — ASPIRIN-DIPYRIDAMOLE ER 25-200 MG PO CP12: 1.0000 | ORAL_CAPSULE | Freq: Every day | ORAL | Status: DC

## 2014-07-29 MED ORDER — ALPRAZOLAM 0.25 MG PO TABS
0.2500 mg | ORAL_TABLET | Freq: Every day | ORAL | Status: DC
  Administered 2014-07-30: 0.25 mg via ORAL
  Filled 2014-07-29: qty 1

## 2014-07-29 MED ORDER — TRIAMCINOLONE 0.1 % CREAM:EUCERIN CREAM 1:1: 1.0000 "application " | TOPICAL_CREAM | Freq: Three times a day (TID) | CUTANEOUS | Status: DC | PRN

## 2014-07-29 MED ORDER — GABAPENTIN 100 MG PO CAPS: 300.0000 mg | ORAL_CAPSULE | Freq: Two times a day (BID) | ORAL | Status: DC

## 2014-07-29 MED ORDER — ALPRAZOLAM 0.25 MG PO TABS: 0.2500 mg | ORAL_TABLET | Freq: Every day | ORAL | Status: DC

## 2014-07-29 MED ORDER — POLYETHYLENE GLYCOL 3350 17 G PO PACK: 17.0000 g | PACK | Freq: Two times a day (BID) | ORAL | Status: DC

## 2014-07-29 MED ORDER — TRAMADOL HCL 50 MG PO TABS: 50.0000 mg | ORAL_TABLET | Freq: Every evening | ORAL | Status: DC | PRN

## 2014-07-29 NOTE — Plan of Care (Signed)
Problem: RH Stairs Goal: LTG Patient will ambulate up and down stairs w/assist (PT) LTG: Patient will ambulate up and down # of stairs with assistance (PT)  Outcome: Completed/Met Date Met:  07/29/14 Downgraded on 9/3 due to safety concerns when descending stairs.

## 2014-07-29 NOTE — Discharge Instructions (Signed)
Inpatient Rehab Discharge Instructions  ODETH BRY Discharge date and time:  07/30/14  Activities/Precautions/ Functional Status: Activity: activity as tolerated Diet: diabetic diet. Low fat Low cholesterol  Wound Care: none needed  Functional status:  ___ No restrictions     ___ Walk up steps independently _X__ 24/7 supervision/assistance   ___ Walk up steps with assistance ___ Intermittent supervision/assistance  ___ Bathe/dress independently ___ Walk with walker and supervision.   _X__ Bathe/dress with assistance ___ Walk Independently    ___ Shower independently __ Walk with assistance    ___ Shower with assistance __X_ No alcohol     ___ Return to work/school ________    STROKE/TIA DISCHARGE INSTRUCTIONS SMOKING Cigarette smoking nearly doubles your risk of having a stroke & is the single most alterable risk factor  If you smoke or have smoked in the last 12 months, you are advised to quit smoking for your health.  Most of the excess cardiovascular risk related to smoking disappears within a year of stopping.  Ask you doctor about anti-smoking medications  [place] Quit Line: 1-800-QUIT NOW  Free Smoking Cessation Classes [phone]  CHOLESTEROL Know your levels; limit fat & cholesterol in your diet  Lipid Panel     Component Value Date/Time   CHOL 153 07/18/2014 0422   TRIG 78 07/18/2014 0422   HDL 75 07/18/2014 0422   CHOLHDL 2.0 07/18/2014 0422   VLDL 16 07/18/2014 0422   LDLCALC 62 07/18/2014 0422      Many patients benefit from treatment even if their cholesterol is at goal.  Goal: Total Cholesterol (CHOL) less than 160  Goal:  Triglycerides (TRIG) less than 150  Goal:  HDL greater than 40  Goal:  LDL (LDLCALC) less than 100   BLOOD PRESSURE American Stroke Association blood pressure target is less that 120/80 mm/Hg  Your discharge blood pressure is:  BP: 125/62 mmHg  Monitor your blood pressure  Limit your salt and alcohol intake  Many individuals  will require more than one medication for high blood pressure  DIABETES (A1c is a blood sugar average for last 3 months) Goal HGBA1c is under 7% (HBGA1c is blood sugar average for last 3 months)  Diabetes:   Lab Results  Component Value Date   HGBA1C 6.0* 07/18/2014     Your HGBA1c can be lowered with medications, healthy diet, and exercise.  Check your blood sugar as directed by your physician  Call your physician if you experience unexplained or low blood sugars.  PHYSICAL ACTIVITY/REHABILITATION Goal is 30 minutes at least 4 days per week  Activity: Increase activity slowly, Therapies: Physical Therapy: Home Health Return to work: N/A  Activity decreases your risk of heart attack and stroke and makes your heart stronger.  It helps control your weight and blood pressure; helps you relax and can improve your mood.  Participate in a regular exercise program.  Talk with your doctor about the best form of exercise for you (dancing, walking, swimming, cycling).  DIET/WEIGHT Goal is to maintain a healthy weight  Your discharge diet is: Heart healthy  liquids Your height is:  4'9" Your current weight is: Weight: 63.8 kg (140 lb 10.5 oz) Your Body Mass Index (BMI) is:  33  Following the type of diet specifically designed for you will help prevent another stroke.  Your goal weight  is:  115 lbs  Your goal Body Mass Index (BMI) is 19-24.  Healthy food habits can help reduce 3 risk factors for stroke:  High cholesterol, hypertension, and excess weight.  RESOURCES Stroke/Support Group:  Call [phone]   STROKE EDUCATION PROVIDED/REVIEWED AND GIVEN TO PATIENT Stroke warning signs and symptoms How to activate emergency medical system (call 911). Medications prescribed at discharge. Need for follow-up after discharge. Personal risk factors for stroke. Pneumonia vaccine given:  Flu vaccine given:  My questions have been answered, the writing is legible, and I understand these  instructions.  I will adhere to these goals & educational materials that have been provided to me after my discharge from the hospital.   Special Instructions: 1. Note Aggrenox dose:  Take one pill daily at bedtime and a Baby aspirin in morning till 08/03/14. Starting 08/04/14--stop taking aspirin and increase Aggrenox to one pill twice a day.    COMMUNITY REFERRALS UPON DISCHARGE:    Home Health:   PT, OT, RN   Agency:[facility] Phone:[phone] Date of last service:  Medical Equipment/Items Ordered:YOUTH Levan [place]st & TUB BENCH  Agency/Supplier:[facility]    [phone] Other:PRIVATE DUTY LIST  GENERAL COMMUNITY RESOURCES FOR PATIENT/FAMILY: Support Groups:[facility] SUPPORT GROUP    My questions have been answered and I understand these instructions. I will adhere to these goals and the provided educational materials after my discharge from the hospital.  Patient/Caregiver Signature _______________________________ Date __________  Clinician Signature _______________________________________ Date __________  Please bring this form and your medication list with you to all your follow-up doctor's appointments.

## 2014-07-29 NOTE — Progress Notes (Signed)
Nutrition Brief Note  Patient identified on the Malnutrition Screening Tool (MST) Report  Wt Readings from Last 15 Encounters:  07/27/14 153 lb (69.4 kg)  07/18/14 152 lb 1.6 oz (68.992 kg)  07/18/14 152 lb 1.6 oz (68.992 kg)  03/01/14 160 lb (72.576 kg)  10/27/13 160 lb (72.576 kg)  01/19/13 163 lb (73.936 kg)  01/05/13 175 lb (79.379 kg)  03/19/12 161 lb (73.029 kg)    Body mass index is 33.1 kg/(m^2). Patient meets criteria for class I obesity based on current BMI. Pt reports her usual body weight of 150 lbs.  Current diet order is heart healthy/carbohydrate modified, patient is consuming approximately 100% of meals at this time. Labs and medications reviewed.   No nutrition interventions warranted at this time. If nutrition issues arise, please consult RD.   Marijean Niemann, MS, Provisional LDN Pager # [phone] After hours/ weekend pager # [phone]

## 2014-07-29 NOTE — Progress Notes (Signed)
Physical Therapy Discharge Summary  Patient Details  Name: [name] [name] MRN: [id number] Date of Birth: Jul 21, 1930  Today's Date: 07/29/2014 PT Individual Time: 1100-1203 PT Individual Time Calculation (min): 63 min    Patient has met 8 of 8 long term goals due to improved activity tolerance, improved balance, improved postural control, ability to compensate for deficits, improved awareness and improved coordination.  Patient to discharge at an ambulatory level Supervision.   Patient's care partner is independent to provide the necessary cognitive assistance at discharge.  Reasons goals not met: n/a, pt continues to require min A for stair negotiation, esp when descending for safety concerns and decreased balance.   Recommendation:  Patient will benefit from ongoing skilled PT services in home health setting to continue to advance safe functional mobility, address ongoing impairments in decreased balance, decreased endurance, decreased overall strength, and minimize fall risk.  Equipment: junior RW  Reasons for discharge: treatment goals met  Patient/family agrees with progress made and goals achieved: Yes  PT Treatment/Intervention:   Pt received sitting in recliner in room, agreeable to therapy session.  Daughter and son present during session to observe and perform stairs with pt.  Discussed that pt would be discharged from therapy today as she has met her goals from therapy standpoint.  Family verbalized understanding, but aware that Medicare may allow her to stay longer than Saturday, but she would not get therapy.  Performed >150' gait in controlled and ADL apt in order to simulate home environment at S level with RW.  Min cues for upright posture and safety when crossing thresholds.  Performed bed mobility and bed<>chair transfers at mod I level with use of RW.  Performed furniture transfers in ADL apt at S level.  Performed MMT, sensation, proprioception, etc testing, see full  details below.  Also performed BERG balance test with only slight improvement noted from 32 to now 35/56, still placing pt at 100% risk for having fall, therefore continue to encourage use of RW and continued HHPT to improve balance.  Performed 6, 6" steps with single handrail at S level when ascending and min/guard level when descending for safety concerns.  Pts daughter return demonstration with pt at same level.  Ambulated back to room and left pt in recliner with all needs in reach.   PT Discharge Precautions/Restrictions Precautions Precautions: Fall Precaution Comments: premorbid tremor in LUE Restrictions Weight Bearing Restrictions: No Vital Signs Therapy Vitals Pulse Rate: 67 BP: 119/45 mmHg Patient Position (if appropriate): Sitting (with legs and feet up) Pain Pain Assessment Pain Assessment: No/denies pain Pain Score: 0-No pain Pain Type: Acute pain Pain Location: Head Pain Orientation: Right Pain Descriptors / Indicators: Aching Pain Frequency: Intermittent Pain Onset: Gradual Pain Intervention(s): Medication (See eMAR) Vision/Perception   No vision deficits, see OT note for details.  Cognition Overall Cognitive Status: Within Functional Limits for tasks assessed Arousal/Alertness: Awake/alert Orientation Level: Oriented X4 Attention: Alternating Alternating Attention: Appears intact Memory: Impaired Memory Impairment: Decreased recall of new information Awareness: Impaired Awareness Impairment: Anticipatory impairment Safety/Judgment: Appears intact Comments: Pt seems very aware of her decreased balance during sessions.  Sensation Sensation Light Touch: Impaired Detail Light Touch Impaired Details: Impaired RLE Stereognosis: Not tested Hot/Cold: Not tested Proprioception: Appears Intact Coordination Gross Motor Movements are Fluid and Coordinated: Yes Fine Motor Movements are Fluid and Coordinated: No Heel Shin Test: Decreased fluidity in RLE Motor   Motor Motor: Hemiplegia;Abnormal postural alignment and control Motor - Discharge Observations: Continues to have mild weakness in  RLE vs LLE, however grossly WFL during functional tasks.   Mobility Bed Mobility Bed Mobility: Supine to Sit;Sit to Supine Supine to Sit: 6: Modified independent (Device/Increase time) Sit to Supine: 6: Modified independent (Device/Increase time) Transfers Transfers: Yes Sit to Stand: 6: Modified independent (Device/Increase time) Stand to Sit: 6: Modified independent (Device/Increase time) Stand Pivot Transfers: 6: Modified independent (Device/Increase time) Locomotion  Ambulation Ambulation: Yes Ambulation/Gait Assistance: 5: Supervision Ambulation Distance (Feet): 160 Feet Assistive device: Rolling walker Ambulation/Gait Assistance Details: Verbal cues for sequencing;Verbal cues for technique;Verbal cues for precautions/safety Gait Gait: Yes Gait Pattern: Impaired Gait Pattern: Step-through pattern;Decreased step length - left;Antalgic;Wide base of support;Decreased stride length;Trendelenburg Stairs / Additional Locomotion Stairs: Yes Stairs Assistance: 4: Min guard Stairs Assistance Details: Verbal cues for sequencing;Verbal cues for technique;Verbal cues for precautions/safety;Verbal cues for gait pattern Stair Management Technique: One rail Right;Step to pattern;Forwards;Sideways Number of Stairs: 6 Height of Stairs: 6 Wheelchair Mobility Wheelchair Mobility: No (pt ambulatory at D/C) Distance:  (pt ambulatory at D/C)  Trunk/Postural Assessment  Cervical Assessment Cervical Assessment: Exceptions to [facility] (forward head posture) Thoracic Assessment Thoracic Assessment: Exceptions to [facility] (forward/rounded shoulders) Lumbar Assessment Lumbar Assessment: Exceptions to [facility] (sits with posterior pelvic tilt) Postural Control Postural Control: Deficits on evaluation Protective Responses: Continues to compensate for decreased balance with use of  hands rather than balance strategies.  Did note good use of ankle strategy during BERG testing.   Balance Balance Balance Assessed: Yes Standardized Balance Assessment Standardized Balance Assessment: Berg Balance Test Berg Balance Test Sit to Stand: Able to stand  independently using hands Standing Unsupported: Able to stand 2 minutes with supervision Sitting with Back Unsupported but Feet Supported on Floor or Stool: Able to sit safely and securely 2 minutes Stand to Sit: Controls descent by using hands Transfers: Able to transfer safely, definite need of hands Standing Unsupported with Eyes Closed: Able to stand 10 seconds with supervision Standing Ubsupported with Feet Together: Able to place feet together independently and stand for 1 minute with supervision From Standing, Reach Forward with Outstretched Arm: Can reach forward >12 cm safely (5") From Standing Position, Pick up Object from Floor: Able to pick up shoe, needs supervision From Standing Position, Turn to Look Behind Over each Shoulder: Needs assist to keep from losing balance and falling Turn 360 Degrees: Needs close supervision or verbal cueing Standing Unsupported, Alternately Place Feet on Step/Stool: Able to complete 4 steps without aid or supervision Standing Unsupported, One Foot in Front: Able to plae foot ahead of the other independently and hold 30 seconds Standing on One Leg: Tries to lift leg/unable to hold 3 seconds but remains standing independently Total Score: 35 Static Standing Balance Static Standing - Balance Support: During functional activity;Left upper extremity supported Static Standing - Level of Assistance: 6: Modified independent (Device/Increase time) Dynamic Standing Balance Dynamic Standing - Balance Support: No upper extremity supported;During functional activity Dynamic Standing - Level of Assistance: 6: Modified independent (Device/Increase time) (able to stand at sink without UE support and  reach for soap, paper towels) Extremity Assessment      RLE Assessment RLE Assessment: Within Functional Limits (hip flex 3+/5, pain with knee flex/ext testing) LLE Assessment LLE Assessment: Within Functional Limits  See FIM for current functional status  Denice Bors 07/29/2014, 12:18 PM

## 2014-07-29 NOTE — Progress Notes (Signed)
Social Work Patient ID: [name] [name], female   DOB: 1930/10/16, 80 y.o.   MRN: [id number] Spoke with Jennye Moccasin regarding medicare appeal, they agree with Dr Wynn Banker and feel discharge is appropriate for tomorrow. Have left a message for son on his cell phone.  Discharge arrangements have been made, family will need to provide someone to stay With her at home.  Have updated team and MD.

## 2014-07-29 NOTE — Progress Notes (Signed)
Social Work Patient ID: [name] [name], female   DOB: 09-12-1930, 80 y.o.   MRN: [id number] Son in agreement with ordering youth rolling walker and tub bench along with follow up home health therapies.  Prepare for discharge once decision made.

## 2014-07-29 NOTE — Plan of Care (Signed)
Problem: RH PAIN MANAGEMENT Goal: RH STG PAIN MANAGED AT OR BELOW PT'S PAIN GOAL <3  Outcome: Progressing Occasional headache. Rates pain as 3

## 2014-07-29 NOTE — Progress Notes (Signed)
Social Work Discharge Note Discharge Note  The overall goal for the admission was met for:   Discharge location: [place] FOR 24 HR SUPERVISION  Length of Stay: Yes-9 DAYS  Discharge activity level: Yes-SUPERVISION LEVEL  Home/community participation: Yes  Services provided included: MD, RD, PT, OT, SLP, RN, CM, Pharmacy and SW  Financial Services: Medicare and Medicaid  Follow-up services arranged: Home Health: [facility]-PT,OT,RN, DME: [place] and Patient/Family has no preference for HH/DME agencies  Comments (or additional information):SON APPEALED MEDICARE DUE TO [place], BUT WAS DENIED AND THEY SIDED WITH MD REGARDING APPROPRIATE DISCHARGE ON SAT. AWARE OF 24 HR SUPERVISION RECOMMENDATION AND OPTIONS AVAILABLE TO THEM IE HIRED ASSIST OR NHP.  DECLINED BOTH OF THEM.  PLAN TO TAKE PT HOME.  Patient/Family verbalized understanding of follow-up arrangements: Yes  Individual responsible for coordination of the follow-up plan: SON-BARHAM  Confirmed correct DME delivered: Elease Hashimoto 07/29/2014    Elease Hashimoto

## 2014-07-29 NOTE — Progress Notes (Signed)
Social Work Patient ID: [name] [name], female   DOB: [birth year], 80 y.o.   MRN: [id number] Have spoken with granddaughter who wants to speak with Neurologist since she understands better than they.  Discussed appeal decision with her also.  She reports they have three days not covered.  Has private duty list to hire assist.  Made clear to her the recommendation is 24 hr supervision and if they leave her alone the liability falls on them. She plans to work on hiring assist for the days not covered by the family.

## 2014-07-29 NOTE — Progress Notes (Signed)
Subjective/Complaints: 80 y.o. right-handed female ([name] [name]) limited English speaking who was visiting family in [place] and she experienced acute onset right-sided weakness with history of thrombocytopenia, hypertension and newly diagnosed diabetes mellitus. Patient independent prior to admission. She was transferred to [facility] at family's request. MRI of the brain showed acute lacunar infarct in the lateral left thalamus near the posterior limb of the left internal capsule. MRA of the head showed left PCA to be occluded. Echocardiogram with ejection fraction of 65% grade 1 diastolic dysfunction. Carotid Dopplers with no ICA stenosis. Patient did not receive TPA. Neurology services consulted placed on aspirin and Aggrenox for CVA prophylaxis and discontinue aspirin once Aggrenox increased to twice a day after 14 days. Complaints of left leg pain denying any recent trauma with x-ray series of left lower extremity negative for fracture.   Pt states she gets tired when she walks [place]:  Limited by language  Objective: Vital Signs: Blood pressure 101/45, pulse 62, temperature 98.1 F (36.7 C), temperature source Oral, resp. rate 18, weight 69.4 kg (153 lb), SpO2 97.00%. No results found. Results for orders placed during the hospital encounter of 07/21/14 (from the past 72 hour(s))  GLUCOSE, CAPILLARY     Status: None   Collection Time    07/26/14 11:45 AM      Result Value Ref Range   Glucose-Capillary 94  70 - 99 mg/dL  GLUCOSE, CAPILLARY     Status: Abnormal   Collection Time    07/26/14  4:23 PM      Result Value Ref Range   Glucose-Capillary 115 (*) 70 - 99 mg/dL  GLUCOSE, CAPILLARY     Status: Abnormal   Collection Time    07/26/14  9:22 PM      Result Value Ref Range   Glucose-Capillary 104 (*) 70 - 99 mg/dL  GLUCOSE, CAPILLARY     Status: None   Collection Time    07/27/14  6:42 AM      Result Value Ref Range   Glucose-Capillary 97  70 - 99 mg/dL   GLUCOSE, CAPILLARY     Status: Abnormal   Collection Time    07/27/14 11:40 AM      Result Value Ref Range   Glucose-Capillary 131 (*) 70 - 99 mg/dL  GLUCOSE, CAPILLARY     Status: Abnormal   Collection Time    07/27/14  5:05 PM      Result Value Ref Range   Glucose-Capillary 113 (*) 70 - 99 mg/dL  GLUCOSE, CAPILLARY     Status: None   Collection Time    07/27/14 10:06 PM      Result Value Ref Range   Glucose-Capillary 99  70 - 99 mg/dL  GLUCOSE, CAPILLARY     Status: None   Collection Time    07/28/14  6:54 AM      Result Value Ref Range   Glucose-Capillary 93  70 - 99 mg/dL  CBC     Status: None   Collection Time    07/28/14  8:00 AM      Result Value Ref Range   WBC 4.3  4.0 - 10.5 K/uL   RBC 4.74  3.87 - 5.11 MIL/uL   Hemoglobin 14.5  12.0 - 15.0 g/dL   HCT 16.1  09.6 - 04.5 %   MCV 93.0  78.0 - 100.0 fL   MCH 30.6  26.0 - 34.0 pg   MCHC 32.9  30.0 - 36.0 g/dL  RDW 13.6  11.5 - 15.5 %   Platelets 161  150 - 400 K/uL  GLUCOSE, CAPILLARY     Status: Abnormal   Collection Time    07/28/14 12:00 PM      Result Value Ref Range   Glucose-Capillary 107 (*) 70 - 99 mg/dL  GLUCOSE, CAPILLARY     Status: Abnormal   Collection Time    07/28/14  4:32 PM      Result Value Ref Range   Glucose-Capillary 114 (*) 70 - 99 mg/dL  GLUCOSE, CAPILLARY     Status: Abnormal   Collection Time    07/28/14 10:24 PM      Result Value Ref Range   Glucose-Capillary 110 (*) 70 - 99 mg/dL  GLUCOSE, CAPILLARY     Status: Abnormal   Collection Time    07/29/14  6:31 AM      Result Value Ref Range   Glucose-Capillary 100 (*) 70 - 99 mg/dL      Physical Exam:    Physical Exam  Constitutional: She appears well-developed.  HENT:  Head: Normocephalic.  Eyes: EOM are normal. Not injected no drainage Neck: Normal range of motion. Neck supple. No thyromegaly present.  Cardiovascular: Normal rate and regular rhythm.  Respiratory: Effort normal and breath sounds normal. No respiratory  distress.  GI: Soft. Bowel sounds are normal. She exhibits no distension.  Neurological: She is alert.  Patient limited English speaking. She was able to communicate her name, age and follows simple commands. Mild right upper extremity weakness---4/5 prox to distal. RLE: HF 3+/5, KE4-/5. ADF/APF 4-/5. Sensory testing reduced LT Right side . Normal strength on the left ,  , EOMI, PERRLA, Musculoskeletal-no pain with AROM R hsoulder and RLE Skin: Skin is warm and dry   Assessment/Plan: 1. Functional deficits secondary to Left thalamic and PLIC infarcts which require 3+ hours per day of interdisciplinary therapy in a comprehensive inpatient rehab setting. Physiatrist is providing close team supervision and 24 hour management of active medical problems listed below. Physiatrist and rehab team continue to assess barriers to discharge/monitor patient progress toward functional and medical goals. Medically stable at sup level for therapy.  Family concerned about Saturday discharge, they feel it is "too early" As noted in note yesterday, not a warfarin candidate so no need for TEE at this time per neuro FIM: FIM - Bathing Bathing Steps Patient Completed: Chest;Right Arm;Left Arm;Abdomen;Front perineal area;Buttocks;Right upper leg;Left upper leg;Right lower leg (including foot);Left lower leg (including foot) Bathing: 5: Supervision: Safety issues/verbal cues  FIM - Upper Body Dressing/Undressing Upper body dressing/undressing steps patient completed: Thread/unthread right sleeve of pullover shirt/dresss;Thread/unthread left sleeve of pullover shirt/dress;Put head through opening of pull over shirt/dress;Pull shirt over trunk;Button/unbutton shirt Upper body dressing/undressing: 5: Supervision: Safety issues/verbal cues FIM - Lower Body Dressing/Undressing Lower body dressing/undressing steps patient completed: Thread/unthread right underwear leg;Thread/unthread left underwear leg;Pull underwear  up/down;Don/Doff right sock;Don/Doff left sock;Don/Doff right shoe;Don/Doff left shoe;Fasten/unfasten right shoe;Fasten/unfasten left shoe Lower body dressing/undressing: 5: Supervision: Safety issues/verbal cues  FIM - Toileting Toileting steps completed by patient: Adjust clothing prior to toileting;Performs perineal hygiene;Adjust clothing after toileting Toileting Assistive Devices: Grab bar or rail for support Toileting: 5: Supervision: Safety issues/verbal cues  FIM - Diplomatic Services operational officer Devices: Art gallery manager Transfers: 5-To toilet/BSC: Supervision (verbal cues/safety issues);5-From toilet/BSC: Supervision (verbal cues/safety issues)  FIM - Press photographer Assistive Devices: Arm rests;Walker Bed/Chair Transfer: 5: Bed > Chair or W/C: Supervision (verbal cues/safety issues);5: Chair or W/C >  Bed: Supervision (verbal cues/safety issues)  FIM - Locomotion: Wheelchair Distance: 45 Locomotion: Wheelchair: 0: Activity did not occur FIM - Locomotion: Ambulation Locomotion: Ambulation Assistive Devices: Designer, industrial/product Ambulation/Gait Assistance: 5: Supervision Locomotion: Ambulation: 5: Travels 150 ft or more with supervision/safety issues  Comprehension Comprehension Mode: Auditory Comprehension: 5-Follows basic conversation/direction: With no assist  Expression Expression Mode: Verbal Expression: 5-Expresses basic needs/ideas: With no assist  Social Interaction Social Interaction: 6-Interacts appropriately with others with medication or extra time (anti-anxiety, antidepressant).  Problem Solving Problem Solving: 5-Solves complex 90% of the time/cues < 10% of the time  Memory Memory: 6-More than reasonable amt of time  Medical Problem List and Plan:  1. Functional deficits secondary to left thalamus, PLIC. Infarct- start Aggrenox BID next wk 2. DVT Prophylaxis/Anticoagulation: SCDs. Monitor for any signs of DVT. Venous Doppler  studies 07/20/2014 negative  3. Pain Management: Neurontin 300 mg each bedtime, Ultram 50 mg each bedtime . Monitor with increased mobility  4. Mood/anxiety: Xanax 0.25 mg twice a day. Provide emotional support  5. Neuropsych: This patient is capable of making decisions on her own behalf (with trranslation).  6. Skin/Wound Care: Routine skin checks  7. History of thrombocytopenia. Latest platelet count normal at 161,000. no bleeding episodes 8. Diabetes mellitus with peripheral neuropathy. Hemoglobin A1c 6.0. Check blood sugars a.c. and at bedtime. Patient on Glucophage 250 mg daily prior to admission. Resume as tolerated  9. Hypertension. No current antihypertensive medication. Patient on atenolol 12.5 mg daily and Cardizem , Lasix 20 mg daily prior to admission. BPs and HRs in acceptable range but on low side, check orthostatics , hold tenormin Resume as needed monitor with increased mobility  10. Hyperlipidemia. Lipitor  11. Benign essential tremor. Continue Mysoline 250 mg twice a day as prior to admission    LOS (Days) 8 A FACE TO FACE EVALUATION WAS PERFORMED  [name] [name] 07/29/2014, 7:07 AM

## 2014-07-29 NOTE — Progress Notes (Signed)
Occupational Therapy Discharge Summary  Patient Details  Name: [name] [name] MRN: [id number] Date of Birth: 1930/07/01  Today's Date: 07/29/2014 OT Individual Time: 2542-7062 and 3762-8315 OT Individual Time Calculation (min): 60 min and 60 min   Patient has met 10 of 10 long term goals due to improved activity tolerance, improved balance and improved coordination.  Patient to discharge at overall Supervision level.  Patient's care partner is independent to provide the necessary supervision assistance at discharge.    Recommendation:  Patient will benefit from ongoing skilled OT services in home health setting to continue to advance functional skills in the area of BADL and iADL.  Equipment: Tub transfer bench   Reasons for discharge: treatment goals met  Patient/family agrees with progress made and goals achieved: Yes  OT intervention: Session 1 ([id number]): Pt with no c/o pain this session. Pt does continue to report dizziness with supine to sit and sit to stand but it subsides in less than 30 seconds. Interpreter present towards end of session for patient education regarding goals, discharge, and follow up therapy discussion. Pt verbalized understanding.  Pt obtained all items needed for bathing with supervision and use of RW. Pt ambulated to tub room and back this session ~ 300 feet with use of RW and supervision. Pt did require frequent rest breaks secondary to fatigue. Pt did not sit but would stop walking and stand in place for a moment. OT encouraged pursed lip breathing while resting. Pt performed tub transfer in shower/tub combination onto bench with use of RW and supervision this session. Bathing with supervision as well as dressing while seated in chair with supervision. Grooming performed in room while standing at sink side with supervision.   Session 2 [phone]): Pt with 5/10 pain described as headache. RN gave medication prior to session. Daughter present during session.  Pt ambulated to ADL kitchen with RW and supervision. Pt stood at [employer] and reached out of base of support to obtain items in kitchen cabinets with supervision. Pt performed hand hygiene independently prior to food prep. Pt made peanut butter sandwich and was able to open packaged with R hand. Pt ambulated to refrigerator and picked up a water pitcher , half full, with R hand and poured glass of water for self with supervision. Pt able to gather all dishes and wash in sink with supervision. Pt ambulated back to room with supervision and use of RW. OT discussed therapeutic exercises to continue at home for B UE strengthening and R FMC. Pt performed 3 sets of 10 chest pulls with green theraband exercises. Pt with increased fatigue with therapeutic exercises and required frequent rest breaks in between sets. Pt and family with no further concerns this session. Pt discharged from OT services with all goals met.   OT Discharge Precautions/Restrictions  Precautions Precautions: Fall Precaution Comments: prior medical hx of tremor in L UE Restrictions Weight Bearing Restrictions: No Pain Pain Assessment Pain Assessment: No/denies pain Pain Score: 5 No pain Pain Type: Acute pain Pain Location: Head Pain Orientation:  ("all over ") Pain Descriptors / Indicators: Headache Pain Frequency: Occasional Pain Onset: Gradual Patients Stated Pain Goal: 2 Pain Intervention(s): Medication (See eMAR) (tyleol 650 mg po) Vision/Perception  Vision- History Baseline Vision/History: Wears glasses Wears Glasses: Distance only Patient Visual Report: No change from baseline Vision- Assessment Vision Assessment?: No apparent visual deficits  Cognition Overall Cognitive Status: Within Functional Limits for tasks assessed Arousal/Alertness: Awake/alert Orientation Level: Oriented X4 Attention: Alternating Alternating Attention: Appears intact  Memory: Impaired Memory Impairment: Decreased recall of new  information Awareness: Impaired Awareness Impairment: Anticipatory impairment Safety/Judgment: Appears intact Comments: Pt appears to be aware of decreased strenght in R upper and lower extremity as well as decreased balance.  Sensation Sensation Light Touch: Impaired Detail Light Touch Impaired Details: Impaired RLE;Impaired RUE Stereognosis: Not tested Hot/Cold: Appears Intact Proprioception: Appears Intact Motor  Motor Motor: Hemiplegia;Abnormal postural alignment and control Motor - Discharge Observations: continues to have decreased weakness in R UE as well as decreased [place]. WFLs for tasks but tires quickly. Mobility  Bed Mobility Bed Mobility: Supine to Sit;Sit to Supine Supine to Sit: 6: Modified independent (Device/Increase time) Sit to Supine: 6: Modified independent (Device/Increase time) Transfers Sit to Stand: 6: Modified independent (Device/Increase time) Stand to Sit: 6: Modified independent (Device/Increase time)  Trunk/Postural Assessment  Cervical Assessment Cervical Assessment: Exceptions to [facility] (forward head) Thoracic Assessment Thoracic Assessment: Exceptions to [facility] (rounded shoulders) Lumbar Assessment Lumbar Assessment: Exceptions to [facility] (posterior pelvic tilt) Postural Control Postural Control: Deficits on evaluation  Balance Dynamic Standing Balance Dynamic Standing - Balance Support: No upper extremity supported;During functional activity Dynamic Standing - Level of Assistance: 5: Stand by assistance Extremity/Trunk Assessment RUE Assessment RUE Assessment: Exceptions to [facility] RUE Strength RUE Overall Strength: Within Functional Limits for tasks performed RUE Overall Strength Comments: mild weakness in R UE and FMC but WFLs for tasks. LUE Assessment LUE Assessment: Within Functional Limits  See FIM for current functional status  [name] [name] 07/29/2014, 9:59 AM

## 2014-07-29 NOTE — Progress Notes (Signed)
Social Work Patient ID: [name] [name], female   DOB: 02-08-1930, 80 y.o.   MRN: [id number] Contacted [employer] appeals they have received the fax this worker sent and are reviewing.  Will contact once decision is made.

## 2014-07-29 NOTE — Progress Notes (Signed)
Chart and note reviewed. Agree with note.  Adriana Lina, RD, LDN, CNSC Pager [phone] After Hours Pager [phone]   

## 2014-07-29 NOTE — Progress Notes (Signed)
Social Work Patient ID: [name] [name], female   DOB: [birth year], 80 y.o.   MRN: [id number] Met with pt, son, daughter and interpreter to discuss Medicare appeal and once decision is made contact will be made and pt will be discharged on that day, whether it is Sat or Sun. Son was hoping to get until Wed when another family member will be coming in to assist pt.  Discussed she has reached her goals of supervision level and according to MD is medically stable. Discussed options of hiring assistance or short term NHP. Son declined both of these.  Daughter is flying back home today.  Team to discharge pt from therapies after today due to reached her goals. Asked son what is the plan if appeal decision given over the weekend, he states: " Then there is no choice I will take her home."  Granddaughter is coming into town this weekend and going back Monday. Will await Medicare appeal decision.

## 2014-07-29 NOTE — Progress Notes (Signed)
Social Work Patient ID: [name] [name], female   DOB: 03-07-1930, 80 y.o.   MRN: [id number] Have spoken with son to discuss discharge plans now appeal decision made now for discharge tomorrow.  He reports he will take his Mom home tomorrow. He expressed no other concern, aware of the team's recommendation of 24 hr supervision.  Family training is complete with daughter and son.

## 2014-07-30 DIAGNOSIS — I69993 Ataxia following unspecified cerebrovascular disease: Secondary | ICD-10-CM

## 2014-07-30 DIAGNOSIS — Z5189 Encounter for other specified aftercare: Secondary | ICD-10-CM

## 2014-07-30 LAB — GLUCOSE, CAPILLARY: GLUCOSE-CAPILLARY: 91 mg/dL (ref 70–99)

## 2014-07-30 NOTE — Progress Notes (Signed)
Subjective/Complaints: 80 y.o. right-handed female [name] limited English speaking who was visiting family in [place] and she experienced acute onset right-sided weakness with history of thrombocytopenia, hypertension and newly diagnosed diabetes mellitus. Patient independent prior to admission. She was transferred to [facility] at family's request. MRI of the brain showed acute lacunar infarct in the lateral left thalamus near the posterior limb of the left internal capsule. MRA of the head showed left PCA to be occluded. Echocardiogram with ejection fraction of 65% grade 1 diastolic dysfunction. Carotid Dopplers with no ICA stenosis. Patient did not receive TPA. Neurology services consulted placed on aspirin and Aggrenox for CVA prophylaxis and discontinue aspirin once Aggrenox increased to twice a day after 14 days. Complaints of left leg pain denying any recent trauma with x-ray series of left lower extremity negative for fracture.   Right leg feels heavy. Still has headache. grandaughter in the room Ros:  Limited by language  Objective: Vital Signs: Blood pressure 121/57, pulse 71, temperature 98.4 F (36.9 C), temperature source Oral, resp. rate 19, weight 69.4 kg (153 lb), SpO2 98.00%. No results found. Results for orders placed during the hospital encounter of 07/21/14 (from the past 72 hour(s))  GLUCOSE, CAPILLARY     Status: Abnormal   Collection Time    07/27/14 11:40 AM      Result Value Ref Range   Glucose-Capillary 131 (*) 70 - 99 mg/dL  GLUCOSE, CAPILLARY     Status: Abnormal   Collection Time    07/27/14  5:05 PM      Result Value Ref Range   Glucose-Capillary 113 (*) 70 - 99 mg/dL  GLUCOSE, CAPILLARY     Status: None   Collection Time    07/27/14 10:06 PM      Result Value Ref Range   Glucose-Capillary 99  70 - 99 mg/dL  GLUCOSE, CAPILLARY     Status: None   Collection Time    07/28/14  6:54 AM      Result Value Ref Range    Glucose-Capillary 93  70 - 99 mg/dL  CBC     Status: None   Collection Time    07/28/14  8:00 AM      Result Value Ref Range   WBC 4.3  4.0 - 10.5 K/uL   RBC 4.74  3.87 - 5.11 MIL/uL   Hemoglobin 14.5  12.0 - 15.0 g/dL   HCT 16.1  09.6 - 04.5 %   MCV 93.0  78.0 - 100.0 fL   MCH 30.6  26.0 - 34.0 pg   MCHC 32.9  30.0 - 36.0 g/dL   RDW 40.9  81.1 - 91.4 %   Platelets 161  150 - 400 K/uL  GLUCOSE, CAPILLARY     Status: Abnormal   Collection Time    07/28/14 12:00 PM      Result Value Ref Range   Glucose-Capillary 107 (*) 70 - 99 mg/dL  GLUCOSE, CAPILLARY     Status: Abnormal   Collection Time    07/28/14  4:32 PM      Result Value Ref Range   Glucose-Capillary 114 (*) 70 - 99 mg/dL  GLUCOSE, CAPILLARY     Status: Abnormal   Collection Time    07/28/14 10:24 PM      Result Value Ref Range   Glucose-Capillary 110 (*) 70 - 99 mg/dL  GLUCOSE, CAPILLARY     Status: Abnormal   Collection Time    07/29/14  6:31 AM  Result Value Ref Range   Glucose-Capillary 100 (*) 70 - 99 mg/dL  GLUCOSE, CAPILLARY     Status: Abnormal   Collection Time    07/29/14 12:33 PM      Result Value Ref Range   Glucose-Capillary 165 (*) 70 - 99 mg/dL   Comment 1 Notify RN    GLUCOSE, CAPILLARY     Status: None   Collection Time    07/29/14  4:35 PM      Result Value Ref Range   Glucose-Capillary 99  70 - 99 mg/dL  GLUCOSE, CAPILLARY     Status: None   Collection Time    07/29/14  9:14 PM      Result Value Ref Range   Glucose-Capillary 93  70 - 99 mg/dL   Comment 1 Documented in Chart     Comment 2 Notify RN    GLUCOSE, CAPILLARY     Status: None   Collection Time    07/30/14  6:54 AM      Result Value Ref Range   Glucose-Capillary 91  70 - 99 mg/dL      Physical Exam:    Physical Exam  Constitutional: She appears well-developed.  HENT:  Head: Normocephalic.  Eyes: EOM are normal. Not injected no drainage Neck: Normal range of motion. Neck supple. No thyromegaly present.   Cardiovascular: Normal rate and regular rhythm.  Respiratory: Effort normal and breath sounds normal. No respiratory distress.  GI: Soft. Bowel sounds are normal. She exhibits no distension.  Neurological: She is alert.  Patient limited English speaking. She was able to communicate her name, age and follows simple commands. Mild right upper extremity weakness---4/5 prox to distal. RLE: HF 3+/5, KE4-/5. ADF/APF 4-/5. Sensory testing reduced LT Right side . Normal strength on the left ,  , EOMI, PERRLA, Musculoskeletal-no pain with AROM R hsoulder and RLE Skin: Skin is warm and dry   Assessment/Plan: 1. Functional deficits secondary to Left thalamic and PLIC infarcts which require 3+ hours per day of interdisciplinary therapy in a comprehensive inpatient rehab setting. Physiatrist is providing close team supervision and 24 hour management of active medical problems listed below. Physiatrist and rehab team continue to assess barriers to discharge/monitor patient progress toward functional and medical goals. Medically stable at sup level for therapy.  Family concerned about Saturday discharge, they feel it is "too early"  Spent extensive time with patient and grandaughter today regarding dx, prognosis, plan. Discussed TEE. Pt not a coumadin candidate and stroke appears to be clearly posterior circulation and/or lacunar in origin.    FIM: FIM - Bathing Bathing Steps Patient Completed: Chest;Right Arm;Left Arm;Abdomen;Front perineal area;Buttocks;Right upper leg;Left upper leg;Right lower leg (including foot);Left lower leg (including foot) Bathing: 5: Supervision: Safety issues/verbal cues  FIM - Upper Body Dressing/Undressing Upper body dressing/undressing steps patient completed: Thread/unthread right sleeve of pullover shirt/dresss;Thread/unthread left sleeve of pullover shirt/dress;Put head through opening of pull over shirt/dress;Pull shirt over trunk;Button/unbutton shirt Upper body  dressing/undressing: 5: Supervision: Safety issues/verbal cues FIM - Lower Body Dressing/Undressing Lower body dressing/undressing steps patient completed: Thread/unthread right underwear leg;Thread/unthread left underwear leg;Pull underwear up/down;Pull pants up/down;Fasten/unfasten pants;Don/Doff right sock;Don/Doff left sock;Don/Doff right shoe;Don/Doff left shoe;Fasten/unfasten right shoe;Fasten/unfasten left shoe (wearing skirt) Lower body dressing/undressing: 5: Supervision: Safety issues/verbal cues  FIM - Toileting Toileting steps completed by patient: Adjust clothing prior to toileting;Performs perineal hygiene;Adjust clothing after toileting Toileting Assistive Devices: Grab bar or rail for support Toileting: 5: Supervision: Safety issues/verbal cues  FIM - Toilet Transfers  Toilet Transfers Assistive Devices: Art gallery manager Transfers: 5-To toilet/BSC: Supervision (verbal cues/safety issues);5-From toilet/BSC: Supervision (verbal cues/safety issues)  FIM - Press photographer Assistive Devices: Arm rests;Walker Bed/Chair Transfer: 6: Supine > Sit: No assist;6: Sit > Supine: No assist;6: Bed > Chair or W/C: No assist;6: Chair or W/C > Bed: No assist  FIM - Locomotion: Wheelchair Distance:  (pt ambulatory at D/C) Locomotion: Wheelchair: 0: Activity did not occur FIM - Locomotion: Ambulation Locomotion: Ambulation Assistive Devices: Designer, industrial/product Ambulation/Gait Assistance: 5: Supervision Locomotion: Ambulation: 5: Travels 150 ft or more with supervision/safety issues  Comprehension Comprehension Mode: Auditory Comprehension: 5-Follows basic conversation/direction: With no assist  Expression Expression Mode: Verbal Expression: 5-Expresses basic needs/ideas: With no assist  Social Interaction Social Interaction: 6-Interacts appropriately with others with medication or extra time (anti-anxiety, antidepressant).  Problem Solving Problem Solving: 5-Solves  complex 90% of the time/cues < 10% of the time  Memory Memory: 6-More than reasonable amt of time  Medical Problem List and Plan:  1. Functional deficits secondary to left thalamus, PLIC. Infarct-  Aggrenox BID   -headaches should improve 2. DVT Prophylaxis/Anticoagulation: SCDs. Monitor for any signs of DVT. Venous Doppler studies 07/20/2014 negative  3. Pain Management: Neurontin 300 mg each bedtime, Ultram 50 mg each bedtime . Monitor with increased mobility  4. Mood/anxiety: Xanax 0.25 mg twice a day. Provide emotional support  5. Neuropsych: This patient is capable of making decisions on her own behalf (with trranslation).  6. Skin/Wound Care: Routine skin checks  7. History of thrombocytopenia. Latest platelet count normal at 161,000. no bleeding episodes 8. Diabetes mellitus with peripheral neuropathy. Hemoglobin A1c 6.0. Check blood sugars a.c. and at bedtime. Patient on Glucophage 250 mg daily prior to admission. Resume as tolerated  9. Hypertension. No current antihypertensive medication. Patient on atenolol 12.5 mg daily and Cardizem , Lasix 20 mg daily prior to admission. BPs and HRs in acceptable range but on low side, check orthostatics , hold tenormin Resume as needed monitor with increased mobility  10. Hyperlipidemia. Lipitor  11. Benign essential tremor. Continue Mysoline 250 mg twice a day as prior to admission    LOS (Days) 9 A FACE TO FACE EVALUATION WAS PERFORMED  Shreeya Recendiz T 07/30/2014, 8:12 AM

## 2014-07-30 NOTE — Progress Notes (Signed)
Patient and family received discharge instructions from Marissa Nestle, PA-C 07/29/14 with verbal understanding. Patient discharged to home with family and belongings.

## 2014-08-02 NOTE — Discharge Summary (Signed)
Discharge summary job [id number]

## 2014-08-02 NOTE — Discharge Summary (Signed)
[name], [name]              ACCOUNT NO.:  [id number]  MEDICAL RECORD NO.:  [id number]  LOCATION:  4M05C                        FACILITY:  [facility]  PHYSICIAN:  Mariam Dollar, P.A.  DATE OF BIRTH:  1930-09-07  DATE OF ADMISSION:  07/21/2014 DATE OF DISCHARGE:  07/30/2014                              DISCHARGE SUMMARY   DISCHARGE DIAGNOSES: 1. Functional deficits secondary to left thalamus posterior limb of     the internal capsule infarct. 2. Sequential compression devices for deep venous thrombosis     prophylaxis with negative venous Doppler studies. 3. Pain management. 4. Anxiety. 5. History of thrombocytopenia. 6. Diabetes mellitus with peripheral neuropathy. 7. Hypertension. 8. Hyperlipidemia. 9. Benign essential tremor.  HISTORY OF PRESENT ILLNESS:  This is an 80 year old right-handed female, limited English-speaking who was visiting family in [place], [place], experienced acute onset of right-sided weakness with history of thrombocytopenia, hypertension.  Independent prior to admission.  She was transferred to [facility] family's request.  MRI of the brain showed acute lacunar infarct in the left lateral thalamus near the posterior limb of the left internal capsule.  MRA of the head showed left PCA to be occluded.  Echocardiogram with ejection fraction 65% grade 1 diastolic dysfunction.  Carotid Dopplers no ICA stenosis.  The patient did not receive tPA.  Neurology Services consulted, placed on aspirin and later with the addition of Aggrenox therapy.  Discontinue aspirin once Aggrenox was increased to twice daily after 14 days. Complaints of left leg pain.  Denying any recent trauma.  X-ray series of left lower extremity negative for fracture.  Physical and occupational therapy ongoing.  The patient was admitted for comprehensive rehab program.  PAST MEDICAL HISTORY:  See discharge diagnoses.  SOCIAL HISTORY:  Lives alone independent prior to  admission.  Functional status upon admission to rehab services was +2 physical assist, moderate assist, ambulate 15 feet to person handheld assistance.  Min assist general transfers, min to mod assist, activities of daily living.  PHYSICAL EXAMINATION:  VITAL SIGNS:  Blood pressure 112/36, pulse 71, temperature 97.9, respirations 16. GENERAL:  This was an alert, non-English-speaking female.  She could provide some simple [place] words.  She made good eye contact with examiner.  She was able to communicate her name and age. HEENT:  Pupils round and reactive to light. LUNGS:  Clear to auscultation. CARDIAC:  Regular rate and rhythm. ABDOMEN:  Soft, nontender.  Good bowel sounds.  REHABILITATION HOSPITAL COURSE:  The patient was admitted to inpatient rehab services with therapies initiated on a 3-hour daily basis consisting of physical therapy, occupational therapy, and rehabilitation nursing.  The following issues were addressed during the patient's rehabilitation stay.  Pertaining to [name] functional deficits secondary to left thalamus PLIC infarct remained stable.  She was now on Aggrenox therapy.  She would follow up outpatient Neurology Services. There was some question of possible TEE as an outpatient which would not change her overall CVA prophylaxis course at this time.  She remained on sequential compression devices for DVT prophylaxis.  Venous Doppler studies negative.  Pain management with the use of Neurontin 300 mg b.i.d. as well as Ultram as needed.  She did  have a documented history of diabetes mellitus, peripheral neuropathy.  Hemoglobin A1c of 6. Blood sugars overall well controlled.  She would follow up with her primary MD.  Blood pressures monitored with no orthostatic changes.  She had been on atenolol, Cardizem in the past.  Again monitored closely and would follow PCP.  Lipitor ongoing for hyperlipidemia.  She had a history of thrombocytopenia.  No bleeding  episodes noted.  Latest platelet count of a 161,000.  The patient received weekly collaborative interdisciplinary team conferences to discuss estimated length of stay, family teaching, any barriers to discharge.  Activity tolerance focused on improved balance, improved postural control, ability to compensate for any deficits.  She was discharged ambulatory level of supervision. Activities of daily living.  Discharge again overall supervision level. There was ongoing talks with family of her progress while on [employer].  They did request further length of stay to be maintained. However, she was supervision level at the time of discharge.  This was discussed at length with family.  Overall, her strength and endurance continued to improve.  She was ambulating in excess of 300 feet with the use of rolling walker and supervision.  She did require some rest brace again for her fatigue factors.  Ongoing therapies would be dictated as per rehab services.  DISCHARGE MEDICATIONS:  Xanax 0.25 mg p.o. daily, aspirin 81 mg daily until August 03, 2014, Lipitor 20 mg p.o. daily, Aggrenox 1 capsule at bedtime increased to twice daily August 04, 2014, Colace 100 mg b.i.d., Neurontin 300 mg p.o. b.i.d., MiraLAX daily hold for loose stool, Mysoline 250 mg p.o. b.i.d., Ultram 1 tablet at bedtime as needed for pain, triamcinolone cream 3 times daily as needed.  DIET:  Regular.  SPECIAL INSTRUCTIONS:  She would follow Dr. Claudette Laws at the outpatient rehab center September 02, 2014.  Dr. Dorothyann Peng, medical management appointment to be made.  Dr. Frances Furbish, ongoing care September 05, 2014.     Mariam Dollar, P.A.     DA/MEDQ  D:  08/02/2014  T:  08/02/2014  Job:  [id number]  cc:   Erick Colace, M.D. Candyce Churn. Allyne Gee, M.D. Pramod P. Pearlean Brownie, MD Huston Foley, MD

## 2014-08-03 ENCOUNTER — Telehealth: Payer: Self-pay | Admitting: Neurology

## 2014-08-03 NOTE — Telephone Encounter (Signed)
Called granddaughter and scheduled patient for sooner appt/confirmed

## 2014-08-03 NOTE — Telephone Encounter (Signed)
Patient's granddaughter, Chaya Jan, calling for an earlier apt with Dr. Frances Furbish. Patient's first appointment was rescheduled from May to October.  She states patient had a stroke 2 weeks ago and she is not doing well and really needs to be seen. Best call back number is [phone]. It is okay to leave a detailed message and okay to reschedule with first available.

## 2014-08-04 ENCOUNTER — Encounter (HOSPITAL_COMMUNITY): Payer: Self-pay | Admitting: Emergency Medicine

## 2014-08-04 ENCOUNTER — Inpatient Hospital Stay (HOSPITAL_COMMUNITY)
Admission: EM | Admit: 2014-08-04 | Discharge: 2014-08-11 | DRG: 057 | Disposition: A | Discharge status: Home Health | Payer: Medicare Other | Attending: Internal Medicine | Admitting: Internal Medicine

## 2014-08-04 DIAGNOSIS — E669 Obesity, unspecified: Secondary | ICD-10-CM | POA: Diagnosis present

## 2014-08-04 DIAGNOSIS — M79605 Pain in left leg: Secondary | ICD-10-CM

## 2014-08-04 DIAGNOSIS — R259 Unspecified abnormal involuntary movements: Secondary | ICD-10-CM | POA: Diagnosis not present

## 2014-08-04 DIAGNOSIS — Z8673 Personal history of transient ischemic attack (TIA), and cerebral infarction without residual deficits: Secondary | ICD-10-CM | POA: Diagnosis present

## 2014-08-04 DIAGNOSIS — G609 Hereditary and idiopathic neuropathy, unspecified: Secondary | ICD-10-CM

## 2014-08-04 DIAGNOSIS — F411 Generalized anxiety disorder: Secondary | ICD-10-CM | POA: Diagnosis present

## 2014-08-04 DIAGNOSIS — Z9849 Cataract extraction status, unspecified eye: Secondary | ICD-10-CM

## 2014-08-04 DIAGNOSIS — I10 Essential (primary) hypertension: Secondary | ICD-10-CM | POA: Diagnosis present

## 2014-08-04 DIAGNOSIS — G250 Essential tremor: Secondary | ICD-10-CM | POA: Diagnosis present

## 2014-08-04 DIAGNOSIS — E119 Type 2 diabetes mellitus without complications: Secondary | ICD-10-CM | POA: Diagnosis present

## 2014-08-04 DIAGNOSIS — G8190 Hemiplegia, unspecified affecting unspecified side: Secondary | ICD-10-CM | POA: Diagnosis present

## 2014-08-04 DIAGNOSIS — I69998 Other sequelae following unspecified cerebrovascular disease: Secondary | ICD-10-CM | POA: Diagnosis not present

## 2014-08-04 DIAGNOSIS — Z8249 Family history of ischemic heart disease and other diseases of the circulatory system: Secondary | ICD-10-CM

## 2014-08-04 DIAGNOSIS — D696 Thrombocytopenia, unspecified: Secondary | ICD-10-CM

## 2014-08-04 DIAGNOSIS — G252 Other specified forms of tremor: Secondary | ICD-10-CM

## 2014-08-04 DIAGNOSIS — E1149 Type 2 diabetes mellitus with other diabetic neurological complication: Secondary | ICD-10-CM | POA: Diagnosis present

## 2014-08-04 DIAGNOSIS — R4182 Altered mental status, unspecified: Secondary | ICD-10-CM | POA: Diagnosis present

## 2014-08-04 DIAGNOSIS — I739 Peripheral vascular disease, unspecified: Secondary | ICD-10-CM | POA: Diagnosis present

## 2014-08-04 DIAGNOSIS — G608 Other hereditary and idiopathic neuropathies: Secondary | ICD-10-CM | POA: Diagnosis present

## 2014-08-04 DIAGNOSIS — I63312 Cerebral infarction due to thrombosis of left middle cerebral artery: Secondary | ICD-10-CM

## 2014-08-04 DIAGNOSIS — E785 Hyperlipidemia, unspecified: Secondary | ICD-10-CM | POA: Diagnosis present

## 2014-08-04 DIAGNOSIS — I639 Cerebral infarction, unspecified: Secondary | ICD-10-CM

## 2014-08-04 DIAGNOSIS — R569 Unspecified convulsions: Secondary | ICD-10-CM | POA: Diagnosis present

## 2014-08-04 DIAGNOSIS — Z86718 Personal history of other venous thrombosis and embolism: Secondary | ICD-10-CM

## 2014-08-04 DIAGNOSIS — R531 Weakness: Secondary | ICD-10-CM | POA: Diagnosis present

## 2014-08-04 DIAGNOSIS — E7800 Pure hypercholesterolemia, unspecified: Secondary | ICD-10-CM | POA: Diagnosis present

## 2014-08-04 NOTE — ED Notes (Signed)
Dr. Campos at bedside   

## 2014-08-04 NOTE — ED Notes (Addendum)
PER EMS: pt from home, reports new onset of tremors to arms bilaterally and head, hx of stroke 3 weeks ago, no other neuro deficits noted at this time. Language barrier present but pt oriented to name and age, does not know time, eyes closed but responds to pain and voice. Unknown baseline mentation at this time.

## 2014-08-05 ENCOUNTER — Ambulatory Visit: Payer: Self-pay | Admitting: Neurology

## 2014-08-05 ENCOUNTER — Telehealth: Payer: Self-pay | Admitting: Neurology

## 2014-08-05 ENCOUNTER — Inpatient Hospital Stay (HOSPITAL_COMMUNITY): Payer: Medicare Other

## 2014-08-05 ENCOUNTER — Encounter (HOSPITAL_COMMUNITY): Payer: Self-pay

## 2014-08-05 ENCOUNTER — Emergency Department (HOSPITAL_COMMUNITY): Payer: Medicare Other

## 2014-08-05 DIAGNOSIS — E785 Hyperlipidemia, unspecified: Secondary | ICD-10-CM

## 2014-08-05 DIAGNOSIS — G608 Other hereditary and idiopathic neuropathies: Secondary | ICD-10-CM | POA: Diagnosis present

## 2014-08-05 DIAGNOSIS — R531 Weakness: Secondary | ICD-10-CM | POA: Diagnosis present

## 2014-08-05 DIAGNOSIS — I6350 Cerebral infarction due to unspecified occlusion or stenosis of unspecified cerebral artery: Secondary | ICD-10-CM

## 2014-08-05 DIAGNOSIS — Z9849 Cataract extraction status, unspecified eye: Secondary | ICD-10-CM | POA: Diagnosis not present

## 2014-08-05 DIAGNOSIS — Z86718 Personal history of other venous thrombosis and embolism: Secondary | ICD-10-CM | POA: Diagnosis not present

## 2014-08-05 DIAGNOSIS — E7800 Pure hypercholesterolemia, unspecified: Secondary | ICD-10-CM | POA: Diagnosis present

## 2014-08-05 DIAGNOSIS — G252 Other specified forms of tremor: Secondary | ICD-10-CM | POA: Diagnosis present

## 2014-08-05 DIAGNOSIS — E1149 Type 2 diabetes mellitus with other diabetic neurological complication: Secondary | ICD-10-CM | POA: Diagnosis present

## 2014-08-05 DIAGNOSIS — G8190 Hemiplegia, unspecified affecting unspecified side: Secondary | ICD-10-CM | POA: Diagnosis present

## 2014-08-05 DIAGNOSIS — R569 Unspecified convulsions: Secondary | ICD-10-CM | POA: Diagnosis present

## 2014-08-05 DIAGNOSIS — I739 Peripheral vascular disease, unspecified: Secondary | ICD-10-CM | POA: Diagnosis present

## 2014-08-05 DIAGNOSIS — R4182 Altered mental status, unspecified: Secondary | ICD-10-CM | POA: Diagnosis present

## 2014-08-05 DIAGNOSIS — F411 Generalized anxiety disorder: Secondary | ICD-10-CM | POA: Diagnosis present

## 2014-08-05 DIAGNOSIS — R259 Unspecified abnormal involuntary movements: Secondary | ICD-10-CM | POA: Insufficient documentation

## 2014-08-05 DIAGNOSIS — E669 Obesity, unspecified: Secondary | ICD-10-CM | POA: Diagnosis present

## 2014-08-05 DIAGNOSIS — Z8249 Family history of ischemic heart disease and other diseases of the circulatory system: Secondary | ICD-10-CM | POA: Diagnosis not present

## 2014-08-05 DIAGNOSIS — I10 Essential (primary) hypertension: Secondary | ICD-10-CM | POA: Diagnosis present

## 2014-08-05 DIAGNOSIS — I69998 Other sequelae following unspecified cerebrovascular disease: Secondary | ICD-10-CM | POA: Diagnosis not present

## 2014-08-05 DIAGNOSIS — Z8673 Personal history of transient ischemic attack (TIA), and cerebral infarction without residual deficits: Secondary | ICD-10-CM | POA: Insufficient documentation

## 2014-08-05 DIAGNOSIS — G250 Essential tremor: Secondary | ICD-10-CM | POA: Diagnosis present

## 2014-08-05 LAB — DIFFERENTIAL
Basophils Absolute: 0 10*3/uL (ref 0.0–0.1)
Basophils Relative: 0 % (ref 0–1)
EOS PCT: 1 % (ref 0–5)
Eosinophils Absolute: 0.1 10*3/uL (ref 0.0–0.7)
Lymphocytes Relative: 47 % — ABNORMAL HIGH (ref 12–46)
Lymphs Abs: 2.8 10*3/uL (ref 0.7–4.0)
MONO ABS: 0.5 10*3/uL (ref 0.1–1.0)
Monocytes Relative: 8 % (ref 3–12)
Neutro Abs: 2.6 10*3/uL (ref 1.7–7.7)
Neutrophils Relative %: 44 % (ref 43–77)

## 2014-08-05 LAB — GLUCOSE, CAPILLARY
GLUCOSE-CAPILLARY: 97 mg/dL (ref 70–99)
Glucose-Capillary: 92 mg/dL (ref 70–99)
Glucose-Capillary: 101 mg/dL — ABNORMAL HIGH (ref 70–99)

## 2014-08-05 LAB — APTT: APTT: 27 s (ref 24–37)

## 2014-08-05 LAB — CBC
HCT: 42.6 % (ref 36.0–46.0)
HEMOGLOBIN: 14.2 g/dL (ref 12.0–15.0)
MCH: 30.6 pg (ref 26.0–34.0)
MCHC: 33.3 g/dL (ref 30.0–36.0)
MCV: 91.8 fL (ref 78.0–100.0)
Platelets: 145 10*3/uL — ABNORMAL LOW (ref 150–400)
RBC: 4.64 MIL/uL (ref 3.87–5.11)
RDW: 13.6 % (ref 11.5–15.5)
WBC: 6 10*3/uL (ref 4.0–10.5)

## 2014-08-05 LAB — COMPREHENSIVE METABOLIC PANEL
ALBUMIN: 3.3 g/dL — AB (ref 3.5–5.2)
ALK PHOS: 65 U/L (ref 39–117)
ALT: 35 U/L (ref 0–35)
ANION GAP: 12 (ref 5–15)
AST: 29 U/L (ref 0–37)
BUN: 21 mg/dL (ref 6–23)
CALCIUM: 8.8 mg/dL (ref 8.4–10.5)
CO2: 26 mEq/L (ref 19–32)
Chloride: 101 mEq/L (ref 96–112)
Creatinine, Ser: 0.77 mg/dL (ref 0.50–1.10)
GFR calc Af Amer: 89 mL/min — ABNORMAL LOW (ref >90)
GFR calc non Af Amer: 77 mL/min — ABNORMAL LOW (ref >90)
GLUCOSE: 94 mg/dL (ref 70–99)
POTASSIUM: 4.6 meq/L (ref 3.7–5.3)
SODIUM: 139 meq/L (ref 137–147)
TOTAL PROTEIN: 6.8 g/dL (ref 6.0–8.3)
Total Bilirubin: <0.2 mg/dL — ABNORMAL LOW (ref 0.3–1.2)

## 2014-08-05 LAB — ETHANOL: Alcohol, Ethyl (B): <11 mg/dL (ref 0–11)

## 2014-08-05 LAB — PROTIME-INR
INR: 1.1 (ref 0.00–1.49)
PROTHROMBIN TIME: 14.2 s (ref 11.6–15.2)

## 2014-08-05 MED ORDER — ASPIRIN-DIPYRIDAMOLE ER 25-200 MG PO CP12
1.0000 | ORAL_CAPSULE | Freq: Two times a day (BID) | ORAL | Status: DC
  Administered 2014-08-05 – 2014-08-06 (×3): 1 via ORAL
  Filled 2014-08-05 (×4): qty 1

## 2014-08-05 MED ORDER — ACETAMINOPHEN 325 MG PO TABS
650.0000 mg | ORAL_TABLET | ORAL | Status: DC | PRN
  Administered 2014-08-05 – 2014-08-08 (×5): 650 mg via ORAL
  Filled 2014-08-05 (×5): qty 2

## 2014-08-05 MED ORDER — ATORVASTATIN CALCIUM 20 MG PO TABS
20.0000 mg | ORAL_TABLET | Freq: Every evening | ORAL | Status: DC
  Administered 2014-08-05 – 2014-08-10 (×6): 20 mg via ORAL
  Filled 2014-08-05 (×7): qty 1

## 2014-08-05 MED ORDER — TRAMADOL HCL 50 MG PO TABS
50.0000 mg | ORAL_TABLET | Freq: Every evening | ORAL | Status: DC | PRN
  Administered 2014-08-05: 50 mg via ORAL
  Filled 2014-08-05 (×2): qty 1

## 2014-08-05 MED ORDER — DOCUSATE SODIUM 100 MG PO CAPS
100.0000 mg | ORAL_CAPSULE | Freq: Two times a day (BID) | ORAL | Status: DC
  Administered 2014-08-05 – 2014-08-11 (×13): 100 mg via ORAL
  Filled 2014-08-05 (×14): qty 1

## 2014-08-05 MED ORDER — HEPARIN SODIUM (PORCINE) 5000 UNIT/ML IJ SOLN
5000.0000 [IU] | Freq: Three times a day (TID) | INTRAMUSCULAR | Status: DC
  Administered 2014-08-05 – 2014-08-11 (×17): 5000 [IU] via SUBCUTANEOUS
  Filled 2014-08-05 (×21): qty 1

## 2014-08-05 MED ORDER — GABAPENTIN 300 MG PO CAPS
300.0000 mg | ORAL_CAPSULE | Freq: Two times a day (BID) | ORAL | Status: DC
  Administered 2014-08-05 – 2014-08-08 (×7): 300 mg via ORAL
  Filled 2014-08-05 (×8): qty 1

## 2014-08-05 MED ORDER — POLYETHYLENE GLYCOL 3350 17 G PO PACK
17.0000 g | PACK | Freq: Every day | ORAL | Status: DC | PRN
  Filled 2014-08-05: qty 1

## 2014-08-05 MED ORDER — SENNOSIDES-DOCUSATE SODIUM 8.6-50 MG PO TABS
1.0000 | ORAL_TABLET | Freq: Every evening | ORAL | Status: DC | PRN
Start: 2014-08-05 — End: 2014-08-11
  Filled 2014-08-05: qty 1

## 2014-08-05 MED ORDER — STROKE: EARLY STAGES OF RECOVERY BOOK
Freq: Once | Status: DC
Start: 2014-08-05 — End: 2014-08-11
  Filled 2014-08-05: qty 1

## 2014-08-05 MED ORDER — INSULIN ASPART 100 UNIT/ML ~~LOC~~ SOLN
0.0000 [IU] | Freq: Three times a day (TID) | SUBCUTANEOUS | Status: DC
  Administered 2014-08-07: 1 [IU] via SUBCUTANEOUS

## 2014-08-05 MED ORDER — TRIAMCINOLONE 0.1 % CREAM:EUCERIN CREAM 1:1: 1.0000 "application " | TOPICAL_CREAM | Freq: Three times a day (TID) | CUTANEOUS | Status: DC | PRN

## 2014-08-05 MED ORDER — INSULIN ASPART 100 UNIT/ML ~~LOC~~ SOLN: 0.0000 [IU] | Freq: Every day | SUBCUTANEOUS | Status: DC

## 2014-08-05 MED ORDER — ALPRAZOLAM 0.25 MG PO TABS: 0.2500 mg | ORAL_TABLET | Freq: Every day | ORAL | Status: DC | PRN

## 2014-08-05 MED ORDER — PRIMIDONE 250 MG PO TABS
250.0000 mg | ORAL_TABLET | Freq: Two times a day (BID) | ORAL | Status: DC
  Administered 2014-08-05 – 2014-08-11 (×13): 250 mg via ORAL
  Filled 2014-08-05 (×14): qty 1

## 2014-08-05 NOTE — ED Provider Notes (Signed)
CSN: [id number]     Arrival date & time 08/04/14  2314 History   First MD Initiated Contact with Patient 08/04/14 2330     Chief Complaint  Patient presents with  . Tremors   Level V caveat: Altered mental status  HPI Patient has recently discharged from the hospital after a left lacunar stroke.  Patient developed headache and altered mental status acutely this evening.  Patient feels that she is more weak on the right side currently.  She is able to speak.  She has her eyes closed.  She follows simple commands.  No reports of vomiting.  Patient is currently on Aggrenox per family.  No other history is obtainable at this time   Past Medical History  Diagnosis Date  . Hypertension   . High cholesterol   . DVT (deep venous thrombosis)   . Pneumonia   . Anxiety   . Essential tremor 10/27/2013  . Obesity, unspecified 10/27/2013  . Newly diagnosed diabetes 10/27/2013  . Unspecified hereditary and idiopathic peripheral neuropathy 10/27/2013   Past Surgical History  Procedure Laterality Date  . Foot surgery    . Cataract extraction Bilateral    Family History  Problem Relation Age of Onset  . Coronary artery disease Other    History  Substance Use Topics  . Smoking status: Never Smoker   . Smokeless tobacco: Not on file  . Alcohol Use: No   OB History   Grav Para Term Preterm Abortions TAB SAB Ect Mult Living                 Review of Systems  Unable to perform ROS: Mental status change      Allergies  Tape  Home Medications   Prior to Admission medications   Medication Sig Start Date End Date Taking? Authorizing Provider  ALPRAZolam Prudy Feeler) 0.25 MG tablet Take 0.25 mg by mouth daily as needed for anxiety.   Yes Historical Provider, MD  atorvastatin (LIPITOR) 20 MG tablet Take 20 mg by mouth every evening.    Yes Historical Provider, MD  dipyridamole-aspirin (AGGRENOX) 200-25 MG per 12 hr capsule Take 1 capsule by mouth 2 (two) times daily.   Yes Historical Provider, MD   docusate sodium (COLACE) 100 MG capsule Take 1 capsule (100 mg total) by mouth 2 (two) times daily. 03/17/13  Yes [name] L Piepenbrink, PA-C  gabapentin (NEURONTIN) 100 MG capsule Take 3 capsules (300 mg total) by mouth 2 (two) times daily. 07/29/14  Yes [name] Kanner Love, PA-C  polyethylene glycol (MIRALAX / GLYCOLAX) packet Take 17 g by mouth daily as needed for mild constipation.   Yes Historical Provider, MD  primidone (MYSOLINE) 250 MG tablet Take 1 tablet (250 mg total) by mouth 2 (two) times daily. 10/27/13  Yes [name] Foley, MD  traMADol (ULTRAM) 50 MG tablet Take 50 mg by mouth at bedtime as needed for moderate pain. 07/29/14  Yes [name] Kanner Love, PA-C  Triamcinolone Acetonide (TRIAMCINOLONE 0.1 % CREAM : EUCERIN) CREA Apply 1 application topically 3 (three) times daily as needed for rash. 07/29/14   [name] Kanner Love, PA-C   BP 119/49  Pulse 53  Temp(Src) 98.1 F (36.7 C) (Oral)  Resp 15  SpO2 99% Physical Exam  Nursing note and vitals reviewed. Constitutional: She appears well-developed and well-nourished. No distress.  HENT:  Head: Normocephalic and atraumatic.  Eyes: EOM are normal.  Neck: Normal range of motion.  Cardiovascular: Normal rate, regular rhythm and normal heart sounds.  Pulmonary/Chest: Effort normal and breath sounds normal.  Abdominal: Soft. She exhibits no distension. There is no tenderness.  Musculoskeletal: Normal range of motion.  Neurological:  Follow simple commands.  Is able to speak.  Unable to assess for true dysarthria given her limited speech.  Right-sided weakness of both right arm and right leg as compared to the left side.  Family reports baseline since her stroke  Skin: Skin is warm and dry.  Psychiatric: She has a normal mood and affect. Judgment normal.    ED Course  Procedures (including critical care time) Labs Review Labs Reviewed  CBC - Abnormal; Notable for the following:    Platelets 145 (*)    All other components within normal limits   COMPREHENSIVE METABOLIC PANEL - Abnormal; Notable for the following:    Albumin 3.3 (*)    Total Bilirubin <0.2 (*)    GFR calc non Af Amer 77 (*)    GFR calc Af Amer 89 (*)    All other components within normal limits  DIFFERENTIAL - Abnormal; Notable for the following:    Lymphocytes Relative 47 (*)    All other components within normal limits  ETHANOL  PROTIME-INR  APTT    Imaging Review Ct Head Wo Contrast  08/05/2014   CLINICAL DATA:  Altered mental status, decreased level of consciousness, recent stroke  EXAM: CT HEAD WITHOUT CONTRAST  TECHNIQUE: Contiguous axial images were obtained from the base of the skull through the vertex without intravenous contrast.  COMPARISON:  MRI brain dated 07/19/2014  FINDINGS: No evidence of parenchymal hemorrhage or extra-axial fluid collection. No mass lesion, mass effect, or midline shift.  No CT evidence of acute infarction. Old left thalamic lacunar infarct.  Subcortical white matter and periventricular small vessel ischemic changes. Intracranial atherosclerosis.  Mild age related atrophy.  No ventriculomegaly.  Visualized paranasal sinuses are essentially clear. Mastoid air cells are unopacified.  No evidence of calvarial fracture.  IMPRESSION: No evidence of acute intracranial abnormality.  Old left thalamic lacunar infarct.  Mild atrophy with small vessel ischemic changes.   Electronically Signed   By: Charline Bills M.D.   On: 08/05/2014 00:47     EKG Interpretation   Date/Time:  Thursday August 04 2014 23:24:07 EDT Ventricular Rate:  75 PR Interval:  173 QRS Duration: 83 QT Interval:  385 QTC Calculation: 430 R Axis:   6 Text Interpretation:  Sinus rhythm Baseline wander in lead(s) II III aVF  No significant change was found Confirmed by Zakyla Tonche  MD, Dolph Tavano ([id number]) on  08/05/2014 3:17:45 AM      MDM   Final diagnoses:  Altered mental status, unspecified altered mental status type    I suspect the patient has bled into her  recent stroke.  She will undergo stat head CT at this time. No labs pending.seizure activity noted.  3:18 AM Seen by neurology.  Neurology is concerned this could represent new stroke on the left as they state that compared to her prior examination she seems to be more weak on the right side of her body.  They're recommending admission to the hospital, MRI in the a.m., EEG.  Please see neuro consultation note for complete details.  Admit to [facility] hospitalist   Lyanne Co, MD 08/05/14 [phone]

## 2014-08-05 NOTE — H&P (Signed)
[facility] History and Physical  [name] [name]:[id number] DOB: [birth year] DOA: 08/04/2014  Referring physician: ED physician PCP: Gwynneth Aliment, MD  Specialists:   Chief Complaint: Left-sided weakness  HPI: [name] [name] is a 80 y.o. female  past medical history of hypertension, diabetes, recently treated thalamic stroke, who presents with weakness on the right side of her body.  Patient was recently admitted (07/18/2014) and treated for left thalamic infarct. She had negative workup for echocardiogram and carotid doppler. The patient was discharged home on Aggrenox.  She went home last Saturday. She completed physical therapy. Her condition has been improving per her son. Patient has very minimal weakness on the right side from the previous stroke. This morning patient was noticed to have worsening weakness on the right side of her body. She also has headache. No slurred speech or hearing loss. No loss of control of bladder or bowel moments. Patient does not have other symptoms, such as fever, chills, chest pain, shortness of breath, abdominal pain and diarrhea. Patient was also noticed to have intermittent shaking episodes, involving both arms and legs. Patient was brought to the emergency room by her son.   Review of Systems: As presented in the history of presenting illness, rest negative.  Where does patient live?  Lives with her son in [place].  Can patient participate in ADLs? barely  Allergy:  Allergies  Allergen Reactions  . Tape Rash    Please do not use Plastic Tape    Past Medical History  Diagnosis Date  . Hypertension   . High cholesterol   . DVT (deep venous thrombosis)   . Pneumonia   . Anxiety   . Essential tremor 10/27/2013  . Obesity, unspecified 10/27/2013  . Newly diagnosed diabetes 10/27/2013  . Unspecified hereditary and idiopathic peripheral neuropathy 10/27/2013    Past Surgical History  Procedure Laterality Date  . Foot surgery    .  Cataract extraction Bilateral     Social History:  reports that she has never smoked. She does not have any smokeless tobacco history on file. She reports that she does not drink alcohol or use illicit drugs.  Family History:  Family History  Problem Relation Age of Onset  . Coronary artery disease Other      Prior to Admission medications   Medication Sig Start Date End Date Taking? Authorizing Provider  ALPRAZolam Prudy Feeler) 0.25 MG tablet Take 0.25 mg by mouth daily as needed for anxiety.   Yes Historical Provider, MD  atorvastatin (LIPITOR) 20 MG tablet Take 20 mg by mouth every evening.    Yes Historical Provider, MD  dipyridamole-aspirin (AGGRENOX) 200-25 MG per 12 hr capsule Take 1 capsule by mouth 2 (two) times daily.   Yes Historical Provider, MD  docusate sodium (COLACE) 100 MG capsule Take 1 capsule (100 mg total) by mouth 2 (two) times daily. 03/17/13  Yes Jennifer L Piepenbrink, PA-C  gabapentin (NEURONTIN) 100 MG capsule Take 3 capsules (300 mg total) by mouth 2 (two) times daily. 07/29/14  Yes Evlyn Kanner Love, PA-C  polyethylene glycol (MIRALAX / GLYCOLAX) packet Take 17 g by mouth daily as needed for mild constipation.   Yes Historical Provider, MD  primidone (MYSOLINE) 250 MG tablet Take 1 tablet (250 mg total) by mouth 2 (two) times daily. 10/27/13  Yes Huston Foley, MD  traMADol (ULTRAM) 50 MG tablet Take 50 mg by mouth at bedtime as needed for moderate pain. 07/29/14  Yes Evlyn Kanner Love, PA-C  Triamcinolone Acetonide (  TRIAMCINOLONE 0.1 % CREAM : EUCERIN) CREA Apply 1 application topically 3 (three) times daily as needed for rash. 07/29/14   Jacquelynn Cree, PA-C    Physical Exam: Filed Vitals:   08/05/14 0245 08/05/14 0315 08/05/14 0400 08/05/14 0447  BP: 114/53 120/57 111/45 125/50  Pulse: 56 62  56  Temp:    97.2 F (36.2 C)  TempSrc:    Oral  Resp: Height:     (1.575 m)  Weight:    69.9 kg (154 lb 1.6 oz)  SpO2: 98% 100% 100% 100%   General: Not in acute  distress HEENT:       Eyes: PERRL, EOMI, no scleral icterus       ENT: No discharge from the ears and nose, no pharynx injection, no tonsillar enlargement.        Neck: No JVD, no bruit, no mass felt. Cardiac: S1/S2, RRR, No murmurs, gallops or rubs Pulm: Good air movement bilaterally. Clear to auscultation bilaterally. No rales, wheezing, rhonchi or rubs. Abd: Soft, nondistended, nontender, no rebound pain, no organomegaly, BS present Ext: No edema. 2+DP/PT pulse bilaterally Musculoskeletal: No joint deformities, erythema, or stiffness, ROM full Skin: No rashes.  Neuro: Alert and oriented X3, cranial nerves II-XII grossly intact. Slightly week in muscle strength on the right side (4/5), has decreased sensation to light touch on right side, including right face. Brachial reflex 2+ bilaterally. Knee reflex 1+ bilaterally. Negative Babinski's sign. Psych: Patient is not psychotic, no suicidal or hemocidal ideation.  Labs on Admission:  Basic Metabolic Panel:  Recent Labs Lab 08/05/14 0020  NA 139  K 4.6  CL 101  CO2 26  GLUCOSE 94  BUN 21  CREATININE 0.77  CALCIUM 8.8   Liver Function Tests:  Recent Labs Lab 08/05/14 0020  AST 29  ALT 35  ALKPHOS 65  BILITOT <0.2*  PROT 6.8  ALBUMIN 3.3*   No results found for this basename: LIPASE, AMYLASE,  in the last 168 hours No results found for this basename: AMMONIA,  in the last 168 hours CBC:  Recent Labs Lab 08/05/14 0020  WBC 6.0  NEUTROABS 2.6  HGB 14.2  HCT 42.6  MCV 91.8  PLT 145*   Cardiac Enzymes: No results found for this basename: CKTOTAL, CKMB, CKMBINDEX, TROPONINI,  in the last 168 hours  BNP (last 3 results) No results found for this basename: PROBNP,  in the last 8760 hours CBG:  Recent Labs Lab 07/29/14 0631 07/29/14 1233 07/29/14 1635 07/29/14 2114 07/30/14 0654  GLUCAP 100* 165* 99 93 91    Radiological Exams on Admission: Ct Head Wo Contrast  08/05/2014   CLINICAL DATA:  Altered mental  status, decreased level of consciousness, recent stroke  EXAM: CT HEAD WITHOUT CONTRAST  TECHNIQUE: Contiguous axial images were obtained from the base of the skull through the vertex without intravenous contrast.  COMPARISON:  MRI brain dated 07/19/2014  FINDINGS: No evidence of parenchymal hemorrhage or extra-axial fluid collection. No mass lesion, mass effect, or midline shift.  No CT evidence of acute infarction. Old left thalamic lacunar infarct.  Subcortical white matter and periventricular small vessel ischemic changes. Intracranial atherosclerosis.  Mild age related atrophy.  No ventriculomegaly.  Visualized paranasal sinuses are essentially clear. Mastoid air cells are unopacified.  No evidence of calvarial fracture.  IMPRESSION: No evidence of acute intracranial abnormality.  Old left thalamic lacunar infarct.  Mild atrophy with small vessel ischemic changes.   Electronically Signed  By: Charline Bills M.D.   On: 08/05/2014 00:47   Assessment/Plan Principal Problem:   Ischemic stroke Active Problems:   DM2 (diabetes mellitus, type 2)   HTN (hypertension)   HLD (hyperlipidemia)   Stroke   Hemiplegia, unspecified, affecting dominant side  1. Ischemic stroke: Patient was diagnosed with acute ischemic stroke on August, currently on Aggrenox. She developed new symptoms with right sided weakness and episodes of shaking, concerning for another new stroke. Head CT shows no acute changes but does reveal the recent left thalamic infarct. Neurology was consulted, Dr. Thad Ranger evaluated the patient and recommend MRI and EEG for further evaluation  - Will admit to tele bed and follow up Dr. Westley Hummer recommendations - MRI of the brain without contrast - PT consult, OT consult, Speech consult - Prophylactic therapy-Continue Aggrenox at current dose, if patient not pass SLP, give ASA per rectal.  - Telemetry monitoring - Frequent neuro checks - EEG  2. type 2 diabetes: Well controlled, used to  be on metformin which was a discontinued 2 weeks ago. Blood sugar level is normal on admission - Monitor CBG as needed  3. Hypertension: Blood pressure normal on admission, not on medications at home -monitor Bp closely  4. hyperlipidemia: LDL was 62 on 07/18/14. On Lipitor at home. -Will continue Lipitor  DVT ppx: SQ Heparin   Code Status: Full code Family Communication: Yes, patient's son at bed side Disposition Plan: Admit to inpatient  Lorretta Harp [facility] Pager [phone]  If 7PM-7AM, please contact night-coverage [email or web address] Password [facility] 08/05/2014, 6:05 AM

## 2014-08-05 NOTE — Progress Notes (Signed)
Stroke Team Progress Note  HISTORY [name] [name] is an 80 y.o. female recently admitted (07/18/2014) and worked up for a left thalamic infarct. Work up was unremarkable and included an echocardiogram and carotid doppler. Patient was started on Aggrenox. Has been home with family and they report that she was walking with a walker with assistance and had enough strength on the right to feed herself. This evening patient began to get a headache. Headache was holocranial. She then became weaker on the right. Patient was noted afterward to have intermittent shaking episodes. Both arms and legs would shake. Patient was aware during these episodes. Although strength has improved on the right since presentation she remains worse that prior to this event. She also continues to have the headache.  Patient was not administerd TPA secondary to recent infarct. Admitted to 3W for further workup.  SUBJECTIVE No acute events  OBJECTIVE Most recent Vital Signs: Filed Vitals:   08/05/14 0447 08/05/14 0659 08/05/14 0800 08/05/14 1000  BP: 125/50 108/49 115/63 115/42  Pulse: 56 60 77 60  Temp: 97.2 F (36.2 C)     TempSrc: Oral     Resp: Height:  (1.575 m)     Weight: 154 lb 1.6 oz (69.9 kg)     SpO2: 100% 100% 100%    CBG (last 3)  No results found for this basename: GLUCAP,  in the last 72 hours  IV Fluid Intake:     MEDICATIONS  .  stroke: mapping our early stages of recovery book   Does not apply Once  . atorvastatin  20 mg Oral QPM  . dipyridamole-aspirin  1 capsule Oral BID  . docusate sodium  100 mg Oral BID  . gabapentin  300 mg Oral BID  . heparin  5,000 Units Subcutaneous 3 times per day  . insulin aspart  0-5 Units Subcutaneous QHS  . insulin aspart  0-9 Units Subcutaneous TID WC  . primidone  250 mg Oral BID   PRN:  acetaminophen, ALPRAZolam, polyethylene glycol, senna-docusate, traMADol, triamcinolone 0.1 % cream : eucerin  Diet:  Cardiac  Activity:  Bedrest DVT  Prophylaxis:  heparin  CLINICALLY SIGNIFICANT STUDIES Basic Metabolic Panel:  Recent Labs Lab 08/05/14 0020  NA 139  K 4.6  CL 101  CO2 26  GLUCOSE 94  BUN 21  CREATININE 0.77  CALCIUM 8.8   Liver Function Tests:  Recent Labs Lab 08/05/14 0020  AST 29  ALT 35  ALKPHOS 65  BILITOT <0.2*  PROT 6.8  ALBUMIN 3.3*   CBC:  Recent Labs Lab 08/05/14 0020  WBC 6.0  NEUTROABS 2.6  HGB 14.2  HCT 42.6  MCV 91.8  PLT 145*   Coagulation:  Recent Labs Lab 08/05/14 0020  LABPROT 14.2  INR 1.10   Cardiac Enzymes: No results found for this basename: CKTOTAL, CKMB, CKMBINDEX, TROPONINI,  in the last 168 hours Urinalysis: No results found for this basename: COLORURINE, APPERANCEUR, LABSPEC, PHURINE, GLUCOSEU, HGBUR, BILIRUBINUR, KETONESUR, PROTEINUR, UROBILINOGEN, NITRITE, LEUKOCYTESUR,  in the last 168 hours Lipid Panel    Component Value Date/Time   CHOL 153 07/18/2014 0422   TRIG 78 07/18/2014 0422   HDL 75 07/18/2014 0422   CHOLHDL 2.0 07/18/2014 0422   VLDL 16 07/18/2014 0422   LDLCALC 62 07/18/2014 0422   HgbA1C  Lab Results  Component Value Date   HGBA1C 6.0* 07/18/2014    Urine Drug Screen:   No results found for this basename:  labopia, cocainscrnur, labbenz, amphetmu, thcu, labbarb    Alcohol Level:  Recent Labs Lab 08/05/14 0020  ETH <11    Ct Head Wo Contrast  08/05/2014   CLINICAL DATA:  Altered mental status, decreased level of consciousness, recent stroke  EXAM: CT HEAD WITHOUT CONTRAST  TECHNIQUE: Contiguous axial images were obtained from the base of the skull through the vertex without intravenous contrast.  COMPARISON:  MRI brain dated 07/19/2014  FINDINGS: No evidence of parenchymal hemorrhage or extra-axial fluid collection. No mass lesion, mass effect, or midline shift.  No CT evidence of acute infarction. Old left thalamic lacunar infarct.  Subcortical white matter and periventricular small vessel ischemic changes. Intracranial atherosclerosis.   Mild age related atrophy.  No ventriculomegaly.  Visualized paranasal sinuses are essentially clear. Mastoid air cells are unopacified.  No evidence of calvarial fracture.  IMPRESSION: No evidence of acute intracranial abnormality.  Old left thalamic lacunar infarct.  Mild atrophy with small vessel ischemic changes.   Electronically Signed   By: Charline Bills M.D.   On: 08/05/2014 00:47    CT of the brain    MRI of the brain    MRA of the brain    2D Echocardiogram    Carotid Doppler    CXR     Therapy Recommendations pending  Physical Exam   Mental Status:  Alert, responsive Cranial Nerves:  II: Visual fields grossly normal, pupils equal, round, reactive to light  III,IV, VI: ptosis not present, extra-ocular motions intact bilaterally  V,VII: mild right facial droop, facial light touch sensation decreased on the right   Motor:  Right : Upper extremity 4/5 Left: Upper extremity 5/5  Lower extremity 4/5 Lower extremity 5/5  Tone and bulk:normal tone throughout; no atrophy noted  Sensory: light touch decreased on the right upper and right lower extremity  Deep Tendon Reflexes: 2+ and symmetric with absent AJ's bilaterally  Plantars:  Right: downgoing Left: downgoing  Cerebellar:  normal finger-to-nose, normal rapid alternating movements and normal heel-to-shin test   ASSESSMENT [name] [name] is a 80 y.o. female presenting with headache and right sided weakness. No tPA as she had recent infarct.Differential is new CVA vs seizure/Todd's paralysis. On aggrenox prior to admission. Now on aggrenox for secondary stroke prevention. Patient with resultant right sided weakness. Stroke work up underway.   Prior CVA  DM  HTN  HLD   Hospital day # 1  TREATMENT/PLAN  MRI brain pending  Rehab evaluation  EEG  Telemetry monitoring  Continue aggrenox   Atorvastatin 20  Would hold on repeating 2D echo and carotid doppler as recently completed   I have  personally obtained a history, examined the patient, evaluated imaging results, and formulated the assessment and plan of care. I agree with the above.  Elspeth Cho, DO [facility] Pager: [phone]   To contact Stroke Continuity provider, please refer to [email or web address]. After hours, contact General Neurology

## 2014-08-05 NOTE — ED Notes (Signed)
Pt able to lift body to get onto bedpan.

## 2014-08-05 NOTE — Consult Note (Addendum)
Referring Physician: Patria Mane    Chief Complaint: Right sided weakness and shaking  HPI: [name] [name] is an 80 y.o. female recently admitted (07/18/2014) and worked up for a left thalamic infarct.  Work up was unremarkable and included an echocardiogram and carotid doppler.  Patient was started on Aggrenox.  Has been home with family and they report that she was walking with a walker with assistance and had enough strength on the right to feed herself.  This evening patient began to get a headache.  Headache was holocranial.  She then became weaker on the right.  Patient was noted afterward to have intermittent shaking episodes.  Both arms and legs would shake.  Patient was aware during these episodes.  Although strength has improved on the right since presentation she remains worse that prior to this event.  She also continues to have the headache.    Date last known well: Date: 08/04/2014 Time last known well: Time: 22:00 tPA Given: No: Recent infarct  Past Medical History  Diagnosis Date  . Hypertension   . High cholesterol   . DVT (deep venous thrombosis)   . Pneumonia   . Anxiety   . Essential tremor 10/27/2013  . Obesity, unspecified 10/27/2013  . Newly diagnosed diabetes 10/27/2013  . Unspecified hereditary and idiopathic peripheral neuropathy 10/27/2013    Past Surgical History  Procedure Laterality Date  . Foot surgery    . Cataract extraction Bilateral     Family History  Problem Relation Age of Onset  . Coronary artery disease Other    Social History:  reports that she has never smoked. She does not have any smokeless tobacco history on file. She reports that she does not drink alcohol or use illicit drugs.  Allergies:  Allergies  Allergen Reactions  . Tape Rash    Please do not use Plastic Tape    Medications: I have reviewed the patient's current medications. Prior to Admission:  Current outpatient prescriptions: ALPRAZolam (XANAX) 0.25 MG tablet, Take 0.25 mg  by mouth daily as needed for anxiety., Disp: , Rfl: ;   atorvastatin (LIPITOR) 20 MG tablet, Take 20 mg by mouth every evening. , Disp: , Rfl: ;   dipyridamole-aspirin (AGGRENOX) 200-25 MG per 12 hr capsule, Take 1 capsule by mouth 2 (two) times daily., Disp: , Rfl:  docusate sodium (COLACE) 100 MG capsule, Take 1 capsule (100 mg total) by mouth 2 (two) times daily., Disp: 10 capsule, Rfl: 0;   gabapentin (NEURONTIN) 100 MG capsule, Take 3 capsules (300 mg total) by mouth 2 (two) times daily., Disp: 90 capsule, Rfl: 1;   polyethylene glycol (MIRALAX / GLYCOLAX) packet, Take 17 g by mouth daily as needed for mild constipation., Disp: , Rfl:  primidone (MYSOLINE) 250 MG tablet, Take 1 tablet (250 mg total) by mouth 2 (two) times daily., Disp: 180 tablet, Rfl: 3;   traMADol (ULTRAM) 50 MG tablet, Take 50 mg by mouth at bedtime as needed for moderate pain., Disp: , Rfl: ;   Triamcinolone Acetonide (TRIAMCINOLONE 0.1 % CREAM : EUCERIN) CREA, Apply 1 application topically 3 (three) times daily as needed for rash., Disp: , Rfl:   ROS: History obtained from the patient  General ROS: generalized weakness Psychological ROS: negative for - behavioral disorder, hallucinations, memory difficulties, mood swings or suicidal ideation Ophthalmic ROS: negative for - blurry vision, double vision, eye pain or loss of vision ENT ROS: negative for - epistaxis, nasal discharge, oral lesions, sore throat, tinnitus or vertigo Allergy  and Immunology ROS: negative for - hives or itchy/watery eyes Hematological and Lymphatic ROS: negative for - bleeding problems, bruising or swollen lymph nodes Endocrine ROS: negative for - galactorrhea, hair pattern changes, polydipsia/polyuria or temperature intolerance Respiratory ROS: negative for - cough, hemoptysis, shortness of breath or wheezing Cardiovascular ROS: negative for - chest pain, dyspnea on exertion, edema or irregular heartbeat Gastrointestinal ROS: negative for -  abdominal pain, diarrhea, hematemesis, nausea/vomiting or stool incontinence Genito-Urinary ROS: negative for - dysuria, hematuria, incontinence or urinary frequency/urgency Musculoskeletal ROS: negative for - joint swelling or muscular weakness Neurological ROS: as noted in HPI Dermatological ROS: negative for rash and skin lesion changes  Physical Examination: Blood pressure 114/53, pulse 56, temperature 98.1 F (36.7 C), temperature source Oral, resp. rate 14, SpO2 98.00%.  Neurologic Examination: Mental Status: Alert, oriented, thought content appropriate.  Speech fluent without evidence of aphasia but patient is speaking through an interpreter.  Able to follow 3 step commands without difficulty. Cranial Nerves: II: Discs flat bilaterally; Visual fields grossly normal, pupils equal, round, reactive to light and accommodation III,IV, VI: ptosis not present, extra-ocular motions intact bilaterally V,VII: right facial droop, facial light touch sensation decreased on the right VIII: hearing normal bilaterally IX,X: gag reflex present XI: bilateral shoulder shrug XII: midline tongue extension Motor: Right : Upper extremity   4-/5    Left:     Upper extremity   5/5  Lower extremity   4-/5     Lower extremity   5/5 Tone and bulk:normal tone throughout; no atrophy noted Sensory: Pinprick and light touch decreased on the right upper and right lower extremity Deep Tendon Reflexes: 2+ and symmetric with absent AJ's bilaterally Plantars: Right: downgoing   Left: downgoing Cerebellar: normal finger-to-nose, normal rapid alternating movements and normal heel-to-shin test Gait: Unable to test safely CV: pulses palpable throughout     Laboratory Studies:  Basic Metabolic Panel:  Recent Labs Lab 08/05/14 0020  NA 139  K 4.6  CL 101  CO2 26  GLUCOSE 94  BUN 21  CREATININE 0.77  CALCIUM 8.8    Liver Function Tests:  Recent Labs Lab 08/05/14 0020  AST 29  ALT 35  ALKPHOS 65   BILITOT <0.2*  PROT 6.8  ALBUMIN 3.3*   No results found for this basename: LIPASE, AMYLASE,  in the last 168 hours No results found for this basename: AMMONIA,  in the last 168 hours  CBC:  Recent Labs Lab 08/05/14 0020  WBC 6.0  NEUTROABS 2.6  HGB 14.2  HCT 42.6  MCV 91.8  PLT 145*    Cardiac Enzymes: No results found for this basename: CKTOTAL, CKMB, CKMBINDEX, TROPONINI,  in the last 168 hours  BNP: No components found with this basename: POCBNP,   CBG:  Recent Labs Lab 07/29/14 0631 07/29/14 1233 07/29/14 1635 07/29/14 2114 07/30/14 0654  GLUCAP 100* 165* 99 93 91    Microbiology: Results for orders placed during the hospital encounter of 03/17/13  URINE CULTURE     Status: None   Collection Time    03/17/13  5:56 PM      Result Value Ref Range Status   Specimen Description URINE, RANDOM   Final   Special Requests NONE   Final   Culture  Setup Time 03/18/2013 00:37   Final   Colony Count >=100,000 COLONIES/ML   Final   Culture     Final   Value: Multiple bacterial morphotypes present, none predominant. Suggest appropriate recollection if clinically  indicated.   Report Status 03/18/2013 FINAL   Final    Coagulation Studies:  Recent Labs  08/05/14 0020  LABPROT 14.2  INR 1.10    Urinalysis: No results found for this basename: COLORURINE, APPERANCEUR, LABSPEC, PHURINE, GLUCOSEU, HGBUR, BILIRUBINUR, KETONESUR, PROTEINUR, UROBILINOGEN, NITRITE, LEUKOCYTESUR,  in the last 168 hours  Lipid Panel:    Component Value Date/Time   CHOL 153 07/18/2014 0422   TRIG 78 07/18/2014 0422   HDL 75 07/18/2014 0422   CHOLHDL 2.0 07/18/2014 0422   VLDL 16 07/18/2014 0422   LDLCALC 62 07/18/2014 0422    HgbA1C:  Lab Results  Component Value Date   HGBA1C 6.0* 07/18/2014    Urine Drug Screen:   No results found for this basename: labopia, cocainscrnur, labbenz, amphetmu, thcu, labbarb    Alcohol Level:  Recent Labs Lab 08/05/14 0020  ETH <11    Other  results: EKG: sinus rhythm at 75 bpm.  Imaging: Ct Head Wo Contrast  08/05/2014   CLINICAL DATA:  Altered mental status, decreased level of consciousness, recent stroke  EXAM: CT HEAD WITHOUT CONTRAST  TECHNIQUE: Contiguous axial images were obtained from the base of the skull through the vertex without intravenous contrast.  COMPARISON:  MRI brain dated 07/19/2014  FINDINGS: No evidence of parenchymal hemorrhage or extra-axial fluid collection. No mass lesion, mass effect, or midline shift.  No CT evidence of acute infarction. Old left thalamic lacunar infarct.  Subcortical white matter and periventricular small vessel ischemic changes. Intracranial atherosclerosis.  Mild age related atrophy.  No ventriculomegaly.  Visualized paranasal sinuses are essentially clear. Mastoid air cells are unopacified.  No evidence of calvarial fracture.  IMPRESSION: No evidence of acute intracranial abnormality.  Old left thalamic lacunar infarct.  Mild atrophy with small vessel ischemic changes.   Electronically Signed   By: Charline Bills M.D.   On: 08/05/2014 00:47    Assessment: 80 y.o. female s/p acute infarct in August of 2015 who now presents with worsening right sided weakness and episodes of shaking.  Unclear if patient has had another acute ischemic event.  Seizure less likely since shaking was bilateral and patient maintained consciousness but can not rule out the possibility of a Todd's paralysis.   Patient on Aggrenox.  Head CT reviewed and shows no acute changes but does reveal the recent left thalamic infarct.  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  Further work up recommended.    Stroke Risk Factors - diabetes mellitus, hyperlipidemia and hypertension  Plan: 1. MRI of the brain without contrast 2. PT consult, OT consult, Speech consult 3. Would not repeat stroke work up at this time 4. Prophylactic therapy-Continue Aggrenox at current dose 5. Telemetry monitoring 6. Frequent neuro  checks 7. EEG  Case discussed with Dr. Constance Goltz, MD [facility] [phone] 08/05/2014, 3:17 AM

## 2014-08-05 NOTE — Care Management Note (Addendum)
Page 1 of 2   08/11/2014     12:40:40 PM CARE MANAGEMENT NOTE 08/11/2014  Patient:  [name] [name]   Account Number:  [id number]  Date Initiated:  08/05/2014  Documentation initiated by:  GRAVES-BIGELOW,Nery Frappier  Subjective/Objective Assessment:   Pt admitted for right sided weakness. Previous pt at [facility] due to stroke.     Action/Plan:   CM will continue to monitor for disposition needs.   Anticipated DC Date:  08/08/2014   Anticipated DC Plan:  IP REHAB FACILITY      DC Planning Services  CM consult      Choice offered to / List presented to:  C-1 Patient        HH arranged  HH-1 RN  HH-10 DISEASE MANAGEMENT  HH-2 PT  HH-4 NURSE'S AIDE  HH-3 OT      HH agency  [facility]   Status of service:  In process, will continue to follow Medicare Important Message given?  YES (If response is "NO", the following Medicare IM given date fields will be blank) Date Medicare IM given:  08/05/2014 Medicare IM given by:  GRAVES-BIGELOW,Negin Hegg Date Additional Medicare IM given:  08/08/2014 Additional Medicare IM given by:  Kyley Solow GRAVES-BIGELOW  Discharge Disposition:  HOME W HOME HEALTH SERVICES  Per UR Regulation:  Reviewed for med. necessity/level of care/duration of stay  If discussed at Long Length of Stay Meetings, dates discussed:   08/09/2014  08/11/2014    Comments:  08-11-14 [address] [place] [phone] Pt was discussed at [facility] meeting today. CM did receive vm stating that [employer] had made determination that Medicare will not pay for continued stay at hospital for pt. Call was received on 08-10-14 at 5:45 per message.  MD is aware. CM did call son [name] this am to make him aware that paper work needs to be signed. [facility] form [facility] Issued Notice of Non-Coverage.) CM was able to leave voice mail. Per pt son would be back to hospital at 1:30 today. CM will then get him to sign paperwork. Pt is planned for d/c home today with [facility] services. RN,  PT, OT and Aide. Medicare IM issued to pt today as well. No further needs from CM at this time.   08-10-14 1449 Tomi Bamberger, RN,BSN [phone] CM spoke to son and all are agreeable for [facility] services to be resumed wit [facility] for RN, PT, OT and aide. CM did call [facility] and MD please write orders for [facility] orders listed above. No further needs from CM at this time. CM did receive call from [employer] in ref to d/c plan however no decision was given for determination of appeal.   08-10-14 [address] Tomi Bamberger, RN,BSN [phone] Appeals Process underway with Medicare. Awaiting decision from Medicare before pt can be d/c. Detailed Notice of dischare given to pt. CM did have to call [name] son and explain to him what the Detailed Notice of Discharge was for and he was able to explain to brother that was here to sign the forms. Forms sent to [facility].  CM did send an email to the [facility]  Liaison [facility] [place] to see if pt is eligible for services. CM also to reached out to [facility] for [facility] to see if pt is eligible for services. Pt and family leaning more towards outpatient Physical Therapy sessions rather than [facility] services. Once family makes a decision, CM will make a referral to the [facility]  on [address]  08-08-14 1454 Tomi Bamberger, RN,BSN  [phone] Pt was to be d/c to CIR today, However CIR was not able to admit pt due to stating will reevaluate 08-09-14 to see if pt at baseline. MD wanted to d/c pt 08-08-14 to CIR. Family has not started the appeal process. CM will continue to monitor.

## 2014-08-05 NOTE — Progress Notes (Signed)
PT Cancellation Note  Patient Details Name: [name] [name] MRN: [id number] DOB: December 05, 1929   Cancelled Treatment:    Reason Eval/Treat Not Completed: Other (comment) PT orders to start tomorrow.  Will complete evaluation at that time.   Chai Verdejo,CYNDI 08/05/2014, 12:16 PM

## 2014-08-05 NOTE — Telephone Encounter (Signed)
Patient no showed for an appointment today, 08/05/2014, at 1100, d/t being admitted to the hospital on 08/04/14 for AMS, worsening R sided weakness and shaking spells.

## 2014-08-05 NOTE — Progress Notes (Signed)
[facility] Progress Note   HELLON LUAN UYQ:[id number] DOB: August 18, 1930 DOA: 08/04/2014 PCP: Gwynneth Aliment, MD  Brief narrative: [name] [name] is a 80 y.o. female with a past medical history of hypertension, diabetes, recently treated thalamic stroke with symptoms of slurred speech and right sided weakness (07/18/14) who presents with increasing weakness on the right side of her body.  Subjective: Feels that her weakness is even worse now than when she presented to the ER yesterday- no other symptoms  Assessment/Plan: Principal Problem:   Right sided weakness - work up in process- initial head CT negative - was placed on Aggrenox after initial CVA- will continue- further recommendations to be made by stroke MD   Active Problems:   DM2 (diabetes mellitus, type 2) - has been overall controlled and Metformin was discontinued - check CBGs while in hospital     HLD (hyperlipidemia) - Lipitor  Code Status: Full code Family Communication: with daughter at bedside who interpreted for me Disposition Plan: to be determined DVT prophylaxis: Heparin  Consultants: Neuro  Procedures: none  Antibiotics: Anti-infectives   None     Objective: Filed Weights   08/05/14 0447  Weight: 69.9 kg (154 lb 1.6 oz)    Intake/Output Summary (Last 24 hours) at 08/05/14 0943 Last data filed at 08/05/14 0700  Gross per 24 hour  Intake      0 ml  Output    350 ml  Net   -350 ml     Vitals Filed Vitals:   08/05/14 0400 08/05/14 0447 08/05/14 0659 08/05/14 0800  BP: 111/45 125/50 108/49 115/63  Pulse:  56 60 77  Temp:  97.2 F (36.2 C)    TempSrc:  Oral    Resp: 19 20 18 18   Height:  5\' 2"  (1.575 m)    Weight:  69.9 kg (154 lb 1.6 oz)    SpO2: 100% 100% 100% 100%    Exam: General: No acute respiratory distress Lungs: Clear to auscultation bilaterally without wheezes or crackles Cardiovascular: Regular rate and rhythm without murmur gallop or rub normal S1 and  S2 Abdomen: Nontender, nondistended, soft, bowel sounds positive, no rebound, no ascites, no appreciable mass Extremities: No significant cyanosis, clubbing, or edema bilateral lower extremities Neuro: 4/5 weakness in right arm and leg- CN 2-12 intact  Data Reviewed: Basic Metabolic Panel:  Recent Labs Lab 08/05/14 0020  NA 139  K 4.6  CL 101  CO2 26  GLUCOSE 94  BUN 21  CREATININE 0.77  CALCIUM 8.8   Liver Function Tests:  Recent Labs Lab 08/05/14 0020  AST 29  ALT 35  ALKPHOS 65  BILITOT <0.2*  PROT 6.8  ALBUMIN 3.3*   No results found for this basename: LIPASE, AMYLASE,  in the last 168 hours No results found for this basename: AMMONIA,  in the last 168 hours CBC:  Recent Labs Lab 08/05/14 0020  WBC 6.0  NEUTROABS 2.6  HGB 14.2  HCT 42.6  MCV 91.8  PLT 145*   Cardiac Enzymes: No results found for this basename: CKTOTAL, CKMB, CKMBINDEX, TROPONINI,  in the last 168 hours BNP (last 3 results) No results found for this basename: PROBNP,  in the last 8760 hours CBG:  Recent Labs Lab 07/29/14 1233 07/29/14 1635 07/29/14 2114 07/30/14 0654  GLUCAP 165* 99 93 91    No results found for this or any previous visit (from the past 240 hour(s)).   Studies:  Recent x-ray studies have been reviewed in detail  by the Attending Physician  Scheduled Meds:  Scheduled Meds: .  stroke: mapping our early stages of recovery book   Does not apply Once  . atorvastatin  20 mg Oral QPM  . dipyridamole-aspirin  1 capsule Oral BID  . docusate sodium  100 mg Oral BID  . gabapentin  300 mg Oral BID  . heparin  5,000 Units Subcutaneous 3 times per day  . insulin aspart  0-5 Units Subcutaneous QHS  . insulin aspart  0-9 Units Subcutaneous TID WC  . primidone  250 mg Oral BID   Continuous Infusions:   Time spent on care of this patient: 35 min   Shanera Meske, MD 08/05/2014, 9:43 AM  LOS: 1 day   [facility] Office  [phone] Pager - Text Page per  [email or web address]  If 7PM-7AM, please contact night-coverage [email or web address]

## 2014-08-05 NOTE — Telephone Encounter (Signed)
Patient's granddaughter called (not listed as an Emergency contact) only relative that speaks [place].  Has questions regarding her illness.  Please call anytime and may leave detailed message.

## 2014-08-05 NOTE — Progress Notes (Signed)
EEG Completed; Results Pending  

## 2014-08-06 DIAGNOSIS — M6281 Muscle weakness (generalized): Secondary | ICD-10-CM

## 2014-08-06 DIAGNOSIS — R4182 Altered mental status, unspecified: Secondary | ICD-10-CM

## 2014-08-06 DIAGNOSIS — I6330 Cerebral infarction due to thrombosis of unspecified cerebral artery: Secondary | ICD-10-CM

## 2014-08-06 DIAGNOSIS — R569 Unspecified convulsions: Secondary | ICD-10-CM

## 2014-08-06 LAB — GLUCOSE, CAPILLARY
GLUCOSE-CAPILLARY: 96 mg/dL (ref 70–99)
Glucose-Capillary: 87 mg/dL (ref 70–99)
Glucose-Capillary: 106 mg/dL — ABNORMAL HIGH (ref 70–99)
Glucose-Capillary: 125 mg/dL — ABNORMAL HIGH (ref 70–99)

## 2014-08-06 MED ORDER — TRAMADOL HCL 50 MG PO TABS
50.0000 mg | ORAL_TABLET | Freq: Four times a day (QID) | ORAL | Status: DC | PRN
  Administered 2014-08-06: 50 mg via ORAL

## 2014-08-06 MED ORDER — LEVETIRACETAM 500 MG PO TABS
500.0000 mg | ORAL_TABLET | Freq: Two times a day (BID) | ORAL | Status: DC
Start: 2014-08-06 — End: 2014-08-11
  Administered 2014-08-06 – 2014-08-11 (×11): 500 mg via ORAL
  Filled 2014-08-06 (×12): qty 1

## 2014-08-06 MED ORDER — CLOPIDOGREL BISULFATE 75 MG PO TABS
75.0000 mg | ORAL_TABLET | Freq: Every day | ORAL | Status: DC
  Administered 2014-08-06 – 2014-08-11 (×6): 75 mg via ORAL
  Filled 2014-08-06 (×6): qty 1

## 2014-08-06 NOTE — Progress Notes (Signed)
Occupational Therapy Evaluation Patient Details Name: BEATRIX WICKLIFF MRN: [id number] DOB: August 07, 1930 Today's Date: 08/06/2014    History of Present Illness 80 y.o. female admitted to [facility] on 07/18/14 with R sided weakness, facial droop.  She was transported from [place] to [facility] per pt and family request.  Stroke workup in progress.  MRI revealed, "Acute lacunar infarct (14 mm) in the lateral left thalamus near the posterior limb of the left internal capsule.The left PCA is occluded."  Pt with significant PMHx of HTN, DVT, anxiety, essential tremor, DM, peripheral neuropathy, and foot surgery.    Clinical Impression   PTA, pt living at home with family and had recently finished rehab at [facility]. Pt was ambulating with S to minguard A and completing ADL with min to mod a and feeding self after set up. Pt demonstrates a significant decline in functional status and will greatly benefit from CIR to return to PLOF. Very supportive family who can provide 24/7 care. Friend interpreted during eval. Pt will benefit from skilled OT services to facilitate D/C to CIR due to below deficits.     Follow Up Recommendations  CIR    Equipment Recommendations  3 in 1 bedside comode    Recommendations for Other Services Speech consult     Precautions / Restrictions Precautions Precautions: Fall      Mobility Bed Mobility Overal bed mobility: Needs Assistance Bed Mobility: Supine to Sit     Supine to sit: Max assist        Transfers Overall transfer level: Needs assistance   Transfers: Sit to/from Stand;Stand Pivot Transfers Sit to Stand: Mod assist Stand pivot transfers: Max assist       General transfer comment: rec nursing staff use +2 assist    Balance Overall balance assessment: Needs assistance Sitting-balance support: Feet supported;Bilateral upper extremity supported Sitting balance-Leahy Scale: Fair     Standing balance support: Bilateral upper extremity supported;During  functional activity Standing balance-Leahy Scale: Poor                              ADL Overall ADL's : Needs assistance/impaired Eating/Feeding: Maximal assistance   Grooming: Maximal assistance;Sitting   Upper Body Bathing: Maximal assistance;Sitting   Lower Body Bathing: Maximal assistance;Sit to/from stand   Upper Body Dressing : Maximal assistance;Sitting   Lower Body Dressing: Total assistance;Sit to/from stand   Toilet Transfer: Maximal assistance   Toileting- Clothing Manipulation and Hygiene: Maximal assistance       Functional mobility during ADLs: +2 for physical assistance;Moderate assistance General ADL Comments: functional decline since D/C from CIR     Vision                     Perception     Praxis      Pertinent Vitals/Pain Pain Assessment: Faces Faces Pain Scale: Hurts little more Pain Location: "all over" Pain Descriptors / Indicators: Aching Pain Intervention(s): Limited activity within patient's tolerance Vitals stable     Hand Dominance Right   Extremity/Trunk Assessment Upper Extremity Assessment Upper Extremity Assessment: RUE deficits/detail RUE Deficits / Details: "tremors" presetn. ataxic type movement. shoulder @ 2/5. elbow @ 2/5. hand 2+/5. reports much weaker now. unable to touch hand to mouth RUE Coordination: decreased fine motor;decreased gross motor   Lower Extremity Assessment Lower Extremity Assessment: Defer to PT evaluation   Cervical / Trunk Assessment Cervical / Trunk Assessment: Normal   Communication Communication Communication: Prefers  language other than English   Cognition Arousal/Alertness: Awake/alert Behavior During Therapy: WFL for tasks assessed/performed Overall Cognitive Status: Within Functional Limits for tasks assessed                     General Comments       Exercises Exercises: Other exercises Other Exercises Other Exercises: encouraged RUE self ROM, general  AROM BUE, [place] AROM BLE   Shoulder Instructions      Home Living Family/patient expects to be discharged to:: Private residence Living Arrangements: Other relatives Available Help at Discharge: Family;Available 24 hours/day Type of Home: House Home Access: Stairs to enter [employer] of Steps: 1 in back, 5 in front Entrance Stairs-Rails: Can reach both Home Layout: One level     Bathroom Shower/Tub: IT trainer: Standard Bathroom Accessibility: Yes How Accessible: Accessible via walker Home Equipment: Walker - 2 wheels;Tub bench          Prior Functioning/Environment Level of Independence: Needs assistance    ADL's / Homemaking Assistance Needed: family assisted @ 50% with dressing. able to feed and bath self after set up Communication / Swallowing Assistance Needed: no straw use Comments: active, likes to exercise, cook, knitting    OT Diagnosis: Generalized weakness;Hemiplegia dominant side;Ataxia   OT Problem List: Decreased strength;Decreased range of motion;Decreased activity tolerance;Impaired balance (sitting and/or standing);Decreased coordination;Decreased knowledge of use of DME or AE;Decreased knowledge of precautions;Impaired tone;Obesity;Impaired UE functional use;Pain   OT Treatment/Interventions: Self-care/ADL training;Therapeutic exercise;Neuromuscular education;DME and/or AE instruction;Therapeutic activities;Patient/family education;Balance training    OT Goals(Current goals can be found in the care plan section) Acute Rehab OT Goals Patient Stated Goal: to get stronger OT Goal Formulation: With patient/family Time For Goal Achievement: 08/20/14 Potential to Achieve Goals: Good  OT Frequency: Min 2X/week   Barriers to D/C:            Co-evaluation              End of Session Equipment Utilized During Treatment: Gait belt Nurse Communication: Mobility status;Other (comment) (D/C rec)  Activity  Tolerance: Patient tolerated treatment well Patient left: in chair;with call bell/phone within reach;with family/visitor present   Time: 1235-1315 OT Time Calculation (min): 40 min Charges:  OT General Charges $OT Visit: 1 Procedure OT Evaluation $Initial OT Evaluation Tier I: 1 Procedure OT Treatments $Self Care/Home Management : 23-37 mins G-Codes:    Adja Ruff,HILLARY 08/08/2014, 3:02 PM   [facility], OTR/L  [phone] August 08, 2014

## 2014-08-06 NOTE — Procedures (Signed)
EEG report.  Brief clinical history: Ms. [name] [name] is a 80 y.o. female presenting with headache and right sided weakness. No tPA as she had recent infarct.Differential is new CVA vs seizure/Todd's paralysis  Technique: this is a 17 channel routine scalp EEG performed at the bedside with bipolar and monopolar montages arranged in accordance to the international 10/20 system of electrode placement. One channel was dedicated to EKG recording.  The study was performed during wakefulness, drowsiness, and stage 2 sleep. No activating procedures performed.  Description:In the wakeful state, the best background consisted of a medium amplitude, posterior dominant, well sustained, symmetric and reactive 9 Hz rhythm. Drowsiness demonstrated dropout of the alpha rhythm. Stage 2 sleep showed symmetric and synchronous sleep spindles without intermixed epileptiform discharges. No focal or generalized epileptiform discharges noted.  No pathologic areas of slowing seen.  EKG showed sinus rhythm.  Impression: this is a normal awake and asleep EEG. Please, be aware that a normal EEG does not exclude the possibility of epilepsy.  Clinical correlation is advised.   Wyatt Portela, MD

## 2014-08-06 NOTE — Progress Notes (Signed)
[facility] Progress Note   JAYLI GUTTERMAN ZOX:[id number] DOB: 29-May-1930 DOA: 08/04/2014 PCP: Gwynneth Aliment, MD  Brief narrative: DAILY PROVINS is a 80 y.o. female with a past medical history of hypertension, diabetes, recently treated thalamic stroke with symptoms of slurred speech and right sided weakness (07/18/14) who presents with increasing weakness on the right side of her body.  Subjective: Weakness in right arm and leg continues  Assessment/Plan: Principal Problem:   Right sided weakness - work up does not reveal a recurrent infarct- pt will need further PT- awaiting PT eval - was placed on Aggrenox after initial CVA- will continue-  Active Problems:   DM2 (diabetes mellitus, type 2) - has been overall controlled and Metformin was discontinued - check CBGs while in hospital     HLD (hyperlipidemia) - Lipitor  Code Status: Full code Family Communication: with daughter and husband at bedside who interpreted for me Disposition Plan: to be determined DVT prophylaxis: Heparin  Consultants: Neuro  Procedures: none  Antibiotics: Anti-infectives   None     Objective: Filed Weights   08/05/14 0447  Weight: 69.9 kg (154 lb 1.6 oz)    Intake/Output Summary (Last 24 hours) at 08/06/14 1218 Last data filed at 08/06/14 0820  Gross per 24 hour  Intake    240 ml  Output    650 ml  Net   -410 ml     Vitals Filed Vitals:   08/06/14 0431 08/06/14 0743 08/06/14 0843 08/06/14 1157  BP: 103/49 120/60 108/60 103/65  Pulse: 63 62 74 65  Temp: 98.3 F (36.8 C) 97.7 F (36.5 C) 97.8 F (36.6 C) 98 F (36.7 C)  TempSrc: Oral Oral Oral Oral  Resp: 18 20 22 21   Height:      Weight:      SpO2: 99% 96% 97% 99%    Exam: General: No acute respiratory distress Lungs: Clear to auscultation bilaterally without wheezes or crackles Cardiovascular: Regular rate and rhythm without murmur gallop or rub normal S1 and S2 Abdomen: Nontender, nondistended, soft,  bowel sounds positive, no rebound, no ascites, no appreciable mass Extremities: No significant cyanosis, clubbing, or edema bilateral lower extremities Neuro: 4/5 weakness in right arm and leg- CN 2-12 intact  Data Reviewed: Basic Metabolic Panel:  Recent Labs Lab 08/05/14 0020  NA 139  K 4.6  CL 101  CO2 26  GLUCOSE 94  BUN 21  CREATININE 0.77  CALCIUM 8.8   Liver Function Tests:  Recent Labs Lab 08/05/14 0020  AST 29  ALT 35  ALKPHOS 65  BILITOT <0.2*  PROT 6.8  ALBUMIN 3.3*   No results found for this basename: LIPASE, AMYLASE,  in the last 168 hours No results found for this basename: AMMONIA,  in the last 168 hours CBC:  Recent Labs Lab 08/05/14 0020  WBC 6.0  NEUTROABS 2.6  HGB 14.2  HCT 42.6  MCV 91.8  PLT 145*   Cardiac Enzymes: No results found for this basename: CKTOTAL, CKMB, CKMBINDEX, TROPONINI,  in the last 168 hours BNP (last 3 results) No results found for this basename: PROBNP,  in the last 8760 hours CBG:  Recent Labs Lab 08/05/14 1141 08/05/14 1647 08/05/14 2127 08/06/14 0733 08/06/14 1157  GLUCAP 97 92 101* 87 106*    No results found for this or any previous visit (from the past 240 hour(s)).   Studies:  Recent x-ray studies have been reviewed in detail by the Attending Physician  Scheduled Meds:  Scheduled  Meds: .  stroke: mapping our early stages of recovery book   Does not apply Once  . atorvastatin  20 mg Oral QPM  . clopidogrel  75 mg Oral Daily  . docusate sodium  100 mg Oral BID  . gabapentin  300 mg Oral BID  . heparin  5,000 Units Subcutaneous 3 times per day  . insulin aspart  0-5 Units Subcutaneous QHS  . insulin aspart  0-9 Units Subcutaneous TID WC  . levETIRAcetam  500 mg Oral BID  . primidone  250 mg Oral BID   Continuous Infusions:   Time spent on care of this patient: 35 min   Kyrillos Adams, MD 08/06/2014, 12:18 PM  LOS: 2 days   [facility] Office  [phone] Pager - Text Page per  [email or web address]  If 7PM-7AM, please contact night-coverage [email or web address]

## 2014-08-06 NOTE — Progress Notes (Addendum)
Stroke Team Progress Note  HISTORY [name] [name] is an 80 y.o. female recently admitted (07/18/2014) and worked up for a left thalamic infarct. Work up was unremarkable and included an echocardiogram and carotid doppler. Patient was started on Aggrenox. Has been home with family and they report that she was walking with a walker with assistance and had enough strength on the right to feed herself. This evening patient began to get a headache. Headache was holocranial. She then became weaker on the right. Patient was noted afterward to have intermittent shaking episodes. Both arms and legs would shake. Patient was aware during these episodes. Although strength has improved on the right since presentation she remains worse that prior to this event. She also continues to have the headache.  Patient was not administerd TPA secondary to recent infarct. Admitted to [facility]W for further workup.  SUBJECTIVE No acute events. Husband and daughter are at bedside. Also talked with pt's granddaughter over the phone, she is an intern now and going for neurology soon. She stated that she was facetiming with pt when pt symptoms starts. She noticed that pt had right shoulder and arm jerking shaking lasted about 30sec. The episodes happened 3-4 time before 911 called. She suspect pt had partial seizure at that time.   Pt today a little lethargic but able to follow commands and participate in physical exam. Has been best rest, not doing any PT/OT yet. Stated that she did not have headache at that time but it always happens in the afternoon. Seems correlating with increased aggrenox dose to bid from once a day at home.    OBJECTIVE Most recent Vital Signs: Filed Vitals:   08/06/14 0743 08/06/14 0843 08/06/14 1157 08/06/14 1707  BP: 120/60 108/60 103/65 105/41  Pulse: 62 74 65 63  Temp: 97.7 F (36.5 C) 97.8 F (36.6 C) 98 F (36.7 C)   TempSrc: Oral Oral Oral   Resp: Height:      Weight:      SpO2: 96%  97% 99% 96%   CBG (last 3)   Recent Labs  08/06/14 0733 08/06/14 1157 08/06/14 1706  GLUCAP 87 106* 96    IV Fluid Intake:     MEDICATIONS  .  stroke: mapping our early stages of recovery book   Does not apply Once  . atorvastatin  20 mg Oral QPM  . clopidogrel  75 mg Oral Daily  . docusate sodium  100 mg Oral BID  . gabapentin  300 mg Oral BID  . heparin  5,000 Units Subcutaneous 3 times per day  . insulin aspart  0-5 Units Subcutaneous QHS  . insulin aspart  0-9 Units Subcutaneous TID WC  . levETIRAcetam  500 mg Oral BID  . primidone  250 mg Oral BID   PRN:  acetaminophen, polyethylene glycol, senna-docusate, traMADol, triamcinolone 0.1 % cream : eucerin  Diet:  Cardiac  Activity:  Bedrest DVT Prophylaxis:  heparin  CLINICALLY SIGNIFICANT STUDIES Basic Metabolic Panel:   Recent Labs Lab 08/05/14 0020  NA 139  K 4.6  CL 101  CO2 26  GLUCOSE 94  BUN 21  CREATININE 0.77  CALCIUM 8.8   Liver Function Tests:   Recent Labs Lab 08/05/14 0020  AST 29  ALT 35  ALKPHOS 65  BILITOT <0.2*  PROT 6.8  ALBUMIN 3.3*   CBC:   Recent Labs Lab 08/05/14 0020  WBC 6.0  NEUTROABS 2.6  HGB 14.2  HCT 42.6  MCV 91.8  PLT 145*   Coagulation:   Recent Labs Lab 08/05/14 0020  LABPROT 14.2  INR 1.10   Cardiac Enzymes: No results found for this basename: CKTOTAL, CKMB, CKMBINDEX, TROPONINI,  in the last 168 hours Urinalysis: No results found for this basename: COLORURINE, APPERANCEUR, LABSPEC, PHURINE, GLUCOSEU, HGBUR, BILIRUBINUR, KETONESUR, PROTEINUR, UROBILINOGEN, NITRITE, LEUKOCYTESUR,  in the last 168 hours Lipid Panel    Component Value Date/Time   CHOL 153 07/18/2014 0422   TRIG 78 07/18/2014 0422   HDL 75 07/18/2014 0422   CHOLHDL 2.0 07/18/2014 0422   VLDL 16 07/18/2014 0422   LDLCALC 62 07/18/2014 0422   HgbA1C  Lab Results  Component Value Date   HGBA1C 6.0* 07/18/2014    Urine Drug Screen:   No results found for this basename: labopia,   cocainscrnur,  labbenz,  amphetmu,  thcu,  labbarb    Alcohol Level:   Recent Labs Lab 08/05/14 0020  ETH <11    Ct Head Wo Contrast  08/05/2014    IMPRESSION: No evidence of acute intracranial abnormality.  Old left thalamic lacunar infarct.  Mild atrophy with small vessel ischemic changes.     Mr Brain Wo Contrast  08/05/2014   IMPRESSION: 1. Persistent 1.1 cm focus of linear restricted diffusion within the lateral left thalamus near the posterior limb of the left internal capsule. Finding is favored to reflect resolving infarct from the 07/19/2014 study given the relative decreased signal intensity and size from that prior exam, although possible acute re-infarction of this area is not entirely excluded. 2. No other acute intracranial abnormality.    07/19/14  IMPRESSION: 1. Acute lacunar infarct (14 mm) in the lateral left thalamus near  the posterior limb of the left internal capsule. No mass effect or  hemorrhage. 2. The left PCA is occluded in the P2 segment, near the expected  location of the left thalamostriate artery origins. 3. Otherwise negative intracranial MRA, and otherwise normal for age  non contrast MRI appearance of the brain.  2D Echocardiogram  07/18/14 - Left ventricle: E/e&'>14.2 suggestive if elevated LV filling pressures. The cavity size was normal. Systolic function was normal. The estimated ejection fraction was in the range of 60% to 65%. Wall motion was normal; there were no regional wall motion abnormalities. There was an increased relative contribution of atrial contraction to ventricular filling. Doppler parameters are consistent with abnormal left ventricular relaxation (grade 1 diastolic dysfunction). - Aortic valve: There was mild regurgitation. - Mitral valve: There was mild regurgitation.  Carotid Doppler  07/18/14 mild calcific plaque origin ICA. 1-39% ICA stenosis. Vertebral artery flow is antegrade.  Venous doppler 07/20/14  - No evidence of  deep vein thrombosis involving the visualized veins of the right lower extremity. - No evidence of deep vein thrombosis involving the visualized veins of the left lower extremity. - No evidence of Baker&'s cyst on the right or left.  Therapy Recommendations CIR  Physical Exam   Mental Status: mild lethargy but Alert, responsive, follows simple commands. Language fluent, naming and repeating intact. Cranial Nerves:  II: Visual fields grossly normal, pupils equal, round, reactive to light  III,IV, VI: ptosis not present, extra-ocular motions intact bilaterally  V,VII: mild right facial droop, facial light touch sensation decreased on the right   Motor:  Right : Upper extremity 4/5 Left: Upper extremity 5/5  Lower extremity 4/5 Lower extremity 5/5  Tone and bulk:normal tone throughout; no atrophy noted  Sensory: light touch decreased on the right  upper and right lower extremity  Deep Tendon Reflexes: 2+ and symmetric with absent AJ's bilaterally  Plantars:  Right: downgoing Left: downgoing  Cerebellar:  normal finger-to-nose, normal rapid alternating movements and normal heel-to-shin test   ASSESSMENT [name] [name] is a 80 y.o. female presenting with headache and right sided weakness. No tPA as she had recent infarct. MRI showed no new stroke but the recent subacute stroke. Consider recrudescence of old stroke in the setting of stroke and possible seizure. The headache may related to aggrenox especially with increasing aggrenox from daily to bid, will d/c aggrenox and change to plavix.  Concerns for simple partial seizure after talking with her granddaughter. EEG neg and pt on primidone for ET. But will add keppra for seizure control and prevention.  CIR consult for inpt rehab. D/c bedrest and allow PT/OT work with her.   Subacute stroke:  Left thalamic felt to be thrombotic secondary to small vessel disease     dipyridamole SR 250 mg/aspirin 25 mg orally twice a day prior to  admission, now on clopidogrel 75 mg orally every day  MRI  showed no new stroke but the recent subacute stroke. Consider recrudescence of old stroke in the setting of stroke and possible seizure.   LDL 62, continue home meds statin   HgbA1c 6.0 WNL  Heparin for VTE prophylaxis  Cardiac diet.   Activity as tolerated  Resultant mild right hemiparesis  Therapy needs:  CIR  Ongoing aggressive risk factor management  Risk factor education  Patient counseled to be compliant with her antithrombotic medications  Disposition:  CIR consult  Seizure - possible partial seizure since pt had thalamic stroke and according to descriptions by family - EEG negative - on primidone for ET - will add keppra for seizure prevention - According to [place] law, you can not drive until seizure free for 6 months and under physician's care.  - Please maintain seizure precautions.  Hyperlipidemia  Home meds:  lipitor. Resumed in hospital   LDL 62   LDL goal < 100  Other Stroke Risk Factors Advanced age  Other Active Problems  ET - on primidone - continue home dose  Hospital day # 2  Neurology will sign off now. Please call with further questions. Thank you for the consult.   Marvel Plan, MD PhD Stroke Neurology 08/06/2014 6:14 PM   To contact Stroke Continuity provider, please refer to [email or web address]. After hours, contact General Neurology

## 2014-08-06 NOTE — Evaluation (Signed)
Physical Therapy Evaluation Patient Details Name: [name] [name] MRN: [id number] DOB: 08/27/1930 Today's Date: 08/06/2014   History of Present Illness  80 y.o. female admitted to [facility] on 07/18/14 with R sided weakness, facial droop.  She was transported from [place] to [facility] per pt and family request.  Stroke workup in progress.  MRI revealed, "Acute lacunar infarct (14 mm) in the lateral left thalamus near the posterior limb of the left internal capsule.The left PCA is occluded."  Pt with significant PMHx of HTN, DVT, anxiety, essential tremor, DM, peripheral neuropathy, and foot surgery.    Clinical Impression  Patient presents with functional limitations due to deficits listed in PT problem list (see below). Pt with right hemiparesis and balance deficits limiting safe mobility. Pt requires hands on assist for transfers and gait training due to deficits in RLE.Very motivated and cooperative. After leaving CIR last week, pt ambulating at S level and doing well from a mobility stand point. Very support family. Pt would benefit from acute PT and follow up at CIR to improve transfers, gait and overall safe mobility so pt can maximize independence, ease burden of care and return to PLOF. Family agreeable to CIR if appropriate.    Follow Up Recommendations CIR    Equipment Recommendations  Other (comment) (defer to CIR)    Recommendations for Other Services Rehab consult     Precautions / Restrictions Precautions Precautions: Fall Restrictions Weight Bearing Restrictions: No      Mobility  Bed Mobility               General bed mobility comments: Sitting in chair upon PT arrival.   Transfers Overall transfer level: Needs assistance Equipment used: Rolling walker (2 wheeled) Transfers: Sit to/from Stand Sit to Stand: Max assist;Mod assist         General transfer comment: Stood from low recliner chair Max A with VC for hand placement and foot positioning; stood x4 with  multiple attempts without assist and only able to clear bottom.   Ambulation/Gait Ambulation/Gait assistance: Mod assist Ambulation Distance (Feet): 8 Feet Assistive device: Rolling walker (2 wheeled) Gait Pattern/deviations: Step-to pattern;Trunk flexed Gait velocity: decreased Gait velocity interpretation: Below normal speed for age/gender General Gait Details: Pt with difficulty advancing RLE requiring assist for weight shift and support for trunk, 1 instance of RLE buckling prior to ambulation resulting in descent into chair. Close chair follow from family. Attempted second ambulation bout, however pt visibly dizzy and reported HA, returned to chair. Gait training performed on RA. Sa02 remained >93%.  Stairs            Wheelchair Mobility    Modified Rankin (Stroke Patients Only)       Balance Overall balance assessment: Needs assistance Sitting-balance support: Feet supported Sitting balance-Leahy Scale: Fair       Standing balance-Leahy Scale: Poor Standing balance comment: Requires BUE support on RW to maintain balance and upright with Min A.                             Pertinent Vitals/Pain Pain Assessment: Faces Faces Pain Scale: Hurts little more Pain Location: headache Pain Descriptors / Indicators: Headache Pain Intervention(s): Monitored during session;Repositioned;Premedicated before session    Home Living Family/patient expects to be discharged to:: Inpatient rehab Living Arrangements: Other relatives Available Help at Discharge: Family;Available 24 hours/day Type of Home: House Home Access: Stairs to enter Entrance Stairs-Rails: Can reach both Entrance Stairs-Number  of Steps: 1 in back, 5 in front Home Layout: One level Home Equipment: Walker - 2 wheels;Tub bench Additional Comments: Pt speaks limited english. Family present and interpreted for evaluation.     Prior Function Level of Independence: Needs assistance   Gait / Transfers  Assistance Needed: Ambulate with RW and supervision level PTA. Recently d/ced from CIR last Saturday 9/5 per family.  ADL's / Homemaking Assistance Needed: family assisted @ 50% with dressing. able to feed and bath self after set up        Hand Dominance        Extremity/Trunk Assessment   Upper Extremity Assessment: Defer to OT evaluation           Lower Extremity Assessment: RLE deficits/detail;Generalized weakness RLE Deficits / Details: Grossly ~2+/5 hip flexion, 2+/5 knee extension, 3/5 knee flexion, 2+/5 DF. RLE generally weak and gives away during standing x1.    Cervical / Trunk Assessment: Normal  Communication   Communication: Prefers language other than English  Cognition Arousal/Alertness: Awake/alert Behavior During Therapy: WFL for tasks assessed/performed Overall Cognitive Status: Within Functional Limits for tasks assessed                      General Comments      Exercises General Exercises - Lower Extremity Long Arc Quad: Both;10 reps;Seated      Assessment/Plan    PT Assessment Patient needs continued PT services  PT Diagnosis Abnormality of gait;Hemiplegia dominant side;Generalized weakness;Difficulty walking   PT Problem List Decreased strength;Decreased range of motion;Impaired sensation;Decreased activity tolerance;Decreased balance;Decreased mobility;Decreased safety awareness  PT Treatment Interventions DME instruction;Balance training;Neuromuscular re-education;Gait training;Patient/family education;Functional mobility training;Therapeutic activities;Therapeutic exercise   PT Goals (Current goals can be found in the Care Plan section) Acute Rehab PT Goals Patient Stated Goal: to walk PT Goal Formulation: With patient/family Time For Goal Achievement: 08/20/14 Potential to Achieve Goals: Good    Frequency Min 4X/week   Barriers to discharge        Co-evaluation               End of Session Equipment Utilized During  Treatment: Gait belt Activity Tolerance: Patient limited by fatigue Patient left: in chair;with call bell/phone within reach;with family/visitor present Nurse Communication: Mobility status         Time: 1545-1610 PT Time Calculation (min): 25 min   Charges:   PT Evaluation $Initial PT Evaluation Tier I: 1 Procedure PT Treatments $Gait Training: 8-22 mins   PT G CodesAlvie Heidelberg A 08/06/2014, 4:22 PM Alvie Heidelberg, PT, DPT [phone]

## 2014-08-07 DIAGNOSIS — R569 Unspecified convulsions: Secondary | ICD-10-CM

## 2014-08-07 LAB — GLUCOSE, CAPILLARY
GLUCOSE-CAPILLARY: 87 mg/dL (ref 70–99)
GLUCOSE-CAPILLARY: 149 mg/dL — AB (ref 70–99)
GLUCOSE-CAPILLARY: 116 mg/dL — AB (ref 70–99)
GLUCOSE-CAPILLARY: 147 mg/dL — AB (ref 70–99)
GLUCOSE-CAPILLARY: 105 mg/dL — AB (ref 70–99)
Glucose-Capillary: 44 mg/dL — CL (ref 70–99)

## 2014-08-07 NOTE — Discharge Summary (Signed)
Physician Discharge Summary  ZAYDA ANGELL WUJ:[id number] DOB: 23-Feb-1930 DOA: 08/04/2014  PCP: Gwynneth Aliment, MD  Admit date: 08/04/2014 Discharge date: 08/07/2014  Time spent: >45 minutes  This is a preliminary discharge summary and will be updated on the date that the patient is discharged from the hospital.  Recommendations for Outpatient Follow-up:  1. F/u on partial seizures- started on Keppra 2. Followup on complaints of dizziness  Discharge Condition: stable Diet recommendation: heart healthy, carb modified  Discharge Diagnoses:  Principal Problem:   Right sided weakness Active Problems:   DM2 (diabetes mellitus, type 2)   HTN (hypertension)   HLD (hyperlipidemia)   Hemiplegia, unspecified, affecting dominant side   Seizures   History of present illness:  This is an 80 year old female with a history of diabetes mellitus hypertension hyperlipidemia who was recently admitted for a left thalamic infarct with right arm and right leg weakness. She was discharged on Aggrenox and underwent physical therapy at home. Apparently she was doing much better and was able to walk with a walker however she suddenly became quite weak and at the same time developed a severe headache. She was admitted to the hospital for further workup. She was not a TPA candidate.  Hospital Course:  Principal Problem:  Right sided weakness  - work up does not reveal a recurrent infarct- pt will need further PT-- CIR has declined to take the patient, patient does not want to go to SNF - suspect she will go home with either home health PT or outpatient PT-  - was placed on Aggrenox after initial CVA- due to complaints of headaches, it has been switched to Plavix -Monitor for improvement as outpatient  Active Problems:   Seizure  -Per history obtained from family patient had and episode of right arm and shoulder jerking lasting about 30 seconds.  -Per neurology, possible partial seizure since pt had  thalamic stroke and according to descriptions by family  - EEG negative  - on primidone for ET  - keppra added by Neurology for seizure prevention   Headaches -these usually start late morning and progress as the day goes on-  -As mentioned above Aggrenox has been discontinued and the patient has been switched to Plavix but headaches continue every day - at this point, suspect tension headaches cont Tylenol as needed   Dizziness - OT attempting to treat for vestibular issues  DM2 (diabetes mellitus, type 2)  - has been overall controlled and Metformin was discontinued  -Sugars being checked regularly during the hospital stay and have been well controlled  HLD (hyperlipidemia)  - Lipitor   Procedures: EEG 9/13-- Rhis is a normal awake and asleep EEG. Please, be aware that a normal EEG does not exclude the possibility of epilepsy.  Clinical correlation is advised.    Consultations:  Neurology  Discharge Exam: Filed Weights   08/05/14 0447 08/07/14 0400  Weight: 69.9 kg (154 lb 1.6 oz) 70.3 kg (154 lb 15.7 oz)   Filed Vitals:   08/07/14 0750  BP: 115/69  Pulse: 68  Temp: 97.4 F (36.3 C)  Resp: 20    General: AAO x 3, no distress Cardiovascular: RRR, no murmurs  Respiratory: clear to auscultation bilaterally GI: soft, non-tender, non-distended, bowel sound positive Neurology: Right arm and leg 4/5 weakness, cranial nerves II through XII are intact,  Discharge Instructions You were cared for by a hospitalist during your hospital stay. If you have any questions about your discharge medications or the care you received  while you were in the hospital after you are discharged, you can call the unit and asked to speak with the hospitalist on call if the hospitalist that took care of you is not available. Once you are discharged, your primary care physician will handle any further medical issues. Please note that NO REFILLS for any discharge medications will be authorized  once you are discharged, as it is imperative that you return to your primary care physician (or establish a relationship with a primary care physician if you do not have one) for your aftercare needs so that they can reassess your need for medications and monitor your lab values.     Medication List    ASK your doctor about these medications       ALPRAZolam 0.25 MG tablet  Commonly known as:  XANAX  Take 0.25 mg by mouth daily as needed for anxiety.     atorvastatin 20 MG tablet  Commonly known as:  LIPITOR  Take 20 mg by mouth every evening.     dipyridamole-aspirin 200-25 MG per 12 hr capsule  Commonly known as:  AGGRENOX  Take 1 capsule by mouth 2 (two) times daily.     docusate sodium 100 MG capsule  Commonly known as:  COLACE  Take 1 capsule (100 mg total) by mouth 2 (two) times daily.     gabapentin 100 MG capsule  Commonly known as:  NEURONTIN  Take 3 capsules (300 mg total) by mouth 2 (two) times daily.     polyethylene glycol packet  Commonly known as:  MIRALAX / GLYCOLAX  Take 17 g by mouth daily as needed for mild constipation.     primidone 250 MG tablet  Commonly known as:  MYSOLINE  Take 1 tablet (250 mg total) by mouth 2 (two) times daily.     traMADol 50 MG tablet  Commonly known as:  ULTRAM  Take 50 mg by mouth at bedtime as needed for moderate pain.     triamcinolone 0.1 % cream : eucerin Crea  Apply 1 application topically 3 (three) times daily as needed for rash.       Allergies  Allergen Reactions  . Tape Rash    Please do not use Plastic Tape       Follow-up Information   Follow up with Xu,Jindong, MD In 2 months. (stroke clinic)    Specialty:  Neurology   Contact information:   [address] [place] [address] [phone]        The results of significant diagnostics from this hospitalization (including imaging, microbiology, ancillary and laboratory) are listed below for reference.    Significant Diagnostic  Studies: Dg Chest 2 View  07/18/2014   CLINICAL DATA:  Dyspnea.  EXAM: CHEST  2 VIEW  COMPARISON:  March 17, 2013.  FINDINGS: Stable cardiomediastinal silhouette. No pneumothorax or pleural effusion is noted. Degenerative disc disease is noted in the lower thoracic spine. No acute pulmonary disease is noted.  IMPRESSION: No acute cardiopulmonary abnormality seen.   Electronically Signed   By: Roque Lias M.D.   On: 07/18/2014 14:10   Dg Hip Complete Left  07/20/2014   CLINICAL DATA:  Left leg pain  EXAM: LEFT HIP - COMPLETE 2+ VIEW  COMPARISON:  None.  FINDINGS: There is no evidence of hip fracture or dislocation. There is mild narrow bilateral hip joint spaces. Osteitis pubis is noted.  IMPRESSION: No acute fracture or dislocation.   Electronically Signed   By: Scherry Ran  Juel Burrow M.D.   On: 07/20/2014 13:07   Dg Femur Left  07/20/2014   CLINICAL DATA:  Left leg pain, no known injury.  EXAM: LEFT FEMUR - 2 VIEW  COMPARISON:  None.  FINDINGS: There is no evidence of fracture or dislocation. Soft tissues are unremarkable.  IMPRESSION: No acute fracture or dislocation of the left femur.   Electronically Signed   By: Sherian Rein M.D.   On: 07/20/2014 13:08   Dg Tibia/fibula Left  07/20/2014   CLINICAL DATA:  Left leg pain, no known injury.  EXAM: LEFT TIBIA AND FIBULA - 2 VIEW  COMPARISON:  None.  FINDINGS: There is no evidence of fracture or dislocation. Soft tissues are unremarkable.  IMPRESSION: No acute fracture or dislocation.   Electronically Signed   By: Sherian Rein M.D.   On: 07/20/2014 13:09   Ct Head Wo Contrast  08/05/2014   CLINICAL DATA:  Altered mental status, decreased level of consciousness, recent stroke  EXAM: CT HEAD WITHOUT CONTRAST  TECHNIQUE: Contiguous axial images were obtained from the base of the skull through the vertex without intravenous contrast.  COMPARISON:  MRI brain dated 07/19/2014  FINDINGS: No evidence of parenchymal hemorrhage or extra-axial fluid collection. No mass  lesion, mass effect, or midline shift.  No CT evidence of acute infarction. Old left thalamic lacunar infarct.  Subcortical white matter and periventricular small vessel ischemic changes. Intracranial atherosclerosis.  Mild age related atrophy.  No ventriculomegaly.  Visualized paranasal sinuses are essentially clear. Mastoid air cells are unopacified.  No evidence of calvarial fracture.  IMPRESSION: No evidence of acute intracranial abnormality.  Old left thalamic lacunar infarct.  Mild atrophy with small vessel ischemic changes.   Electronically Signed   By: Charline Bills M.D.   On: 08/05/2014 00:47   Mr Brain Wo Contrast  08/05/2014   CLINICAL DATA:  Weakness on right side of body.  EXAM: MRI HEAD WITHOUT CONTRAST  TECHNIQUE: Multiplanar, multiecho pulse sequences of the brain and surrounding structures were obtained without intravenous contrast.  COMPARISON:  None.  FINDINGS: There is a in 1.1 cm linear focus of restricted diffusion within the lateral aspect of the left thalamus in region of previously identified stroke seen on 07/19/2014. Overall, the diffusion-weighted signal abnormality is less intense and decreased in size relative to prior study (most evident on coronal DWI sequence). Finding may reflect resolving ischemic infarct as diffusion-weighted signal abnormality can persist for up to 10 base (and longer). Possible re-infarction is not entirely excluded. No significant mass effect. Minimal petechial hemorrhage seen within this region on gradient echo sequence.  No other foci of abnormal restricted diffusion are identified. Gray-white matter differentiation maintained. Normal flow voids seen within the intracranial vasculature.  Few additional patchy T2/FLAIR hyperintensities again noted within the periventricular and supratentorial white matter, unchanged. Cerebral volume within normal limits for patient age.  No mass lesion or midline shift. No hydrocephalus. There is no extra-axial fluid  collection.  Calvarium and scalp soft tissues are unremarkable.  No acute abnormality seen about either orbit.  Paranasal sinuses and mastoid air cells are clear.  IMPRESSION: 1. Persistent 1.1 cm focus of linear restricted diffusion within the lateral left thalamus near the posterior limb of the left internal capsule. Finding is favored to reflect resolving infarct from the 07/19/2014 study given the relative decreased signal intensity and size from that prior exam, although possible acute re-infarction of this area is not entirely excluded. 2. No other acute intracranial abnormality.   Electronically  Signed   By: Rise Mu M.D.   On: 08/05/2014 23:00   Mr Brain Wo Contrast  07/19/2014   ADDENDUM REPORT: 07/19/2014 13:19  ADDENDUM: Study discussed by telephone with Dr. Onalee Hua Tat on 07/19/2014 at 1254 hrs.   Electronically Signed   By: Augusto Gamble M.D.   On: 07/19/2014 13:19   07/19/2014   CLINICAL DATA:  80 year old female with acute onset right side weakness facial droop and slurred speech. Code stroke. Improved symptoms, patient was not a candidate for tPA. Initial encounter.  EXAM: MRI HEAD WITHOUT CONTRAST  MRA HEAD WITHOUT CONTRAST  TECHNIQUE: Multiplanar, multiecho pulse sequences of the brain and surrounding structures were obtained without intravenous contrast. Angiographic images of the head were obtained using MRA technique without contrast.  COMPARISON:  Cervical spine MRI 03/16/2013.  FINDINGS: MRI HEAD FINDINGS  Oval 14 mm area of restricted diffusion in the lateral left thalamus abutting the posterior limb of the left internal capsule. T2 and FLAIR hyperintensity. No hemorrhage or mass effect. No other restricted diffusion identified. Major intracranial vascular flow voids are preserved. No midline shift, mass effect, evidence of mass lesion, ventriculomegaly, extra-axial collection or acute intracranial hemorrhage. Cervicomedullary junction and pituitary are within normal limits. Negative  for age visualized cervical spine. Outside of the acute findings, normal for age gray and white matter signal throughout the brain. Visible internal auditory structures appear normal. Mastoids are clear. Minor paranasal sinus mucosal thickening. Postoperative changes to the globes. Visualized scalp soft tissues are within normal limits. Normal bone marrow signal.  MRA HEAD FINDINGS  Antegrade flow in the posterior circulation with codominant distal vertebral arteries. Normal vertebrobasilar junction. Normal right PICA origin. Left AICA may be dominant. Mildly tortuous basilar artery, no stenosis. SCA and PCA origins are within normal limits.  The left PCAs occluded in the P2 segment, with no distal left PCA flow (series 605, image 18). Posterior communicating arteries are diminutive or absent. Right PCA branches are within normal limits.  Antegrade flow in both ICA siphons. Mild tortuosity but no siphon stenosis identified. Ophthalmic artery origins are within normal limits. Carotid termini are patent. MCA and ACA origins are within normal limits. Anterior communicating artery is normal. Median artery of the corpus callosum is present. Visualized bilateral ACA branches are within normal limits. Visualized bilateral MCA branches are within normal limits.  IMPRESSION: 1. Acute lacunar infarct (14 mm) in the lateral left thalamus near the posterior limb of the left internal capsule. No mass effect or hemorrhage. 2. The left PCA is occluded in the P2 segment, near the expected location of the left thalamostriate artery origins. 3. Otherwise negative intracranial MRA, and otherwise normal for age non contrast MRI appearance of the brain.  Electronically Signed: By: Augusto Gamble M.D. On: 07/19/2014 12:22   Dg Foot Complete Left  07/20/2014   CLINICAL DATA:  Left leg pain  EXAM: LEFT FOOT - COMPLETE 3+ VIEW  COMPARISON:  None.  FINDINGS: There is no evidence of fracture or dislocation. There is calcification at the  insertion of Achilles tendon to the calcaneus. Soft tissues are unremarkable.  IMPRESSION: No acute fracture or dislocation.   Electronically Signed   By: Sherian Rein M.D.   On: 07/20/2014 13:11   Mr Maxine Glenn Head/brain Wo Cm  07/19/2014   ADDENDUM REPORT: 07/19/2014 13:19  ADDENDUM: Study discussed by telephone with Dr. Onalee Hua Tat on 07/19/2014 at 1254 hrs.   Electronically Signed   By: Augusto Gamble M.D.   On: 07/19/2014  13:19   07/19/2014   CLINICAL DATA:  80 year old female with acute onset right side weakness facial droop and slurred speech. Code stroke. Improved symptoms, patient was not a candidate for tPA. Initial encounter.  EXAM: MRI HEAD WITHOUT CONTRAST  MRA HEAD WITHOUT CONTRAST  TECHNIQUE: Multiplanar, multiecho pulse sequences of the brain and surrounding structures were obtained without intravenous contrast. Angiographic images of the head were obtained using MRA technique without contrast.  COMPARISON:  Cervical spine MRI 03/16/2013.  FINDINGS: MRI HEAD FINDINGS  Oval 14 mm area of restricted diffusion in the lateral left thalamus abutting the posterior limb of the left internal capsule. T2 and FLAIR hyperintensity. No hemorrhage or mass effect. No other restricted diffusion identified. Major intracranial vascular flow voids are preserved. No midline shift, mass effect, evidence of mass lesion, ventriculomegaly, extra-axial collection or acute intracranial hemorrhage. Cervicomedullary junction and pituitary are within normal limits. Negative for age visualized cervical spine. Outside of the acute findings, normal for age gray and white matter signal throughout the brain. Visible internal auditory structures appear normal. Mastoids are clear. Minor paranasal sinus mucosal thickening. Postoperative changes to the globes. Visualized scalp soft tissues are within normal limits. Normal bone marrow signal.  MRA HEAD FINDINGS  Antegrade flow in the posterior circulation with codominant distal vertebral  arteries. Normal vertebrobasilar junction. Normal right PICA origin. Left AICA may be dominant. Mildly tortuous basilar artery, no stenosis. SCA and PCA origins are within normal limits.  The left PCAs occluded in the P2 segment, with no distal left PCA flow (series 605, image 18). Posterior communicating arteries are diminutive or absent. Right PCA branches are within normal limits.  Antegrade flow in both ICA siphons. Mild tortuosity but no siphon stenosis identified. Ophthalmic artery origins are within normal limits. Carotid termini are patent. MCA and ACA origins are within normal limits. Anterior communicating artery is normal. Median artery of the corpus callosum is present. Visualized bilateral ACA branches are within normal limits. Visualized bilateral MCA branches are within normal limits.  IMPRESSION: 1. Acute lacunar infarct (14 mm) in the lateral left thalamus near the posterior limb of the left internal capsule. No mass effect or hemorrhage. 2. The left PCA is occluded in the P2 segment, near the expected location of the left thalamostriate artery origins. 3. Otherwise negative intracranial MRA, and otherwise normal for age non contrast MRI appearance of the brain.  Electronically Signed: By: Augusto Gamble M.D. On: 07/19/2014 12:22    Microbiology: No results found for this or any previous visit (from the past 240 hour(s)).   Labs: Basic Metabolic Panel:  Recent Labs Lab 08/05/14 0020  NA 139  K 4.6  CL 101  CO2 26  GLUCOSE 94  BUN 21  CREATININE 0.77  CALCIUM 8.8   Liver Function Tests:  Recent Labs Lab 08/05/14 0020  AST 29  ALT 35  ALKPHOS 65  BILITOT <0.2*  PROT 6.8  ALBUMIN 3.3*   No results found for this basename: LIPASE, AMYLASE,  in the last 168 hours No results found for this basename: AMMONIA,  in the last 168 hours CBC:  Recent Labs Lab 08/05/14 0020  WBC 6.0  NEUTROABS 2.6  HGB 14.2  HCT 42.6  MCV 91.8  PLT 145*   Cardiac Enzymes: No results  found for this basename: CKTOTAL, CKMB, CKMBINDEX, TROPONINI,  in the last 168 hours BNP: BNP (last 3 results) No results found for this basename: PROBNP,  in the last 8760 hours CBG:  Recent Labs  Lab 08/06/14 0733 08/06/14 1157 08/06/14 1706 08/06/14 2039 08/07/14 0742  GLUCAP 87 106* 96 125* 87       SignedCalvert Cantor, MD [facility] 08/07/2014, 10:39 AM

## 2014-08-07 NOTE — Progress Notes (Addendum)
Hypoglycemic noted when sugar checked at lunchtime. Patient has not received any insulin or any other hypoglycemic at all during this hospital stay. Suspect this was an incorrect reading. She did not have the typical symptoms of hypoglycemia and subsequent sugars have been normal. No further management. She does not need to have her sugars checked as she has been told that her Diabetes has resolved and she was taken off of Metformin but we are checking them upon family's request.   Calvert Cantor, MD

## 2014-08-07 NOTE — Discharge Summary (Addendum)
Physician Discharge Summary  [name] [name] WRU:[id number] DOB: [birth year] DOA: 08/04/2014  PCP: Gwynneth Aliment, MD  Admit date: 08/04/2014 Discharge date: 08/07/2014  Time spent: >45 minutes  This is a preliminary discharge summary and will be updated on the date that the patient is discharged from the hospital.  Recommendations for Outpatient Follow-up:  1. F/u on partial seizures- started on Keppra 2. Followup for resolution of headaches  Discharge Condition: stable Diet recommendation: heart healthy, carb modified  Discharge Diagnoses:  Principal Problem:   Right sided weakness Active Problems:   DM2 (diabetes mellitus, type 2)   HTN (hypertension)   HLD (hyperlipidemia)   Hemiplegia, unspecified, affecting dominant side   Seizures   History of present illness:  This is an 80 year old female with a history of diabetes mellitus hypertension hyperlipidemia who was recently admitted for a left thalamic infarct with right arm and right leg weakness. She was discharged on Aggrenox and underwent physical therapy at home. Apparently she was doing much better and was able to walk with a walker however she suddenly became quite weak and at the same time developed a severe headache. She was admitted to the hospital for further workup. She was not a TPA candidate.  Hospital Course:  Principal Problem:  Right sided weakness  - work up does not reveal a recurrent infarct- pt will need further PT-- CIR has been recommended for the patient - was placed on Aggrenox after initial CVA- due to complaints of headaches, it has been switched to Plavix -Monitor for improvement as outpatient  Active Problems:   Seizure  -Per history obtained from family patient has been having episodes of right arm and shoulder jerking lasting about 30 seconds. She's had multiple spells. -Per neurology, possible partial seizure since pt had thalamic stroke and according to descriptions by family  - EEG  negative  - on primidone for ET  - keppra added for seizure prevention  - According to [place] law, you can not drive until seizure free for 6 months and under physician's care.    Headaches -Her patient these usually start late morning and progress as the day goes on -As mentioned above Aggrenox has been discontinued and the patient has been switched to Plavix   DM2 (diabetes mellitus, type 2)  - has been overall controlled and Metformin was discontinued  -Sugars being checked regularly during the hospital stay and have been well controlled  HLD (hyperlipidemia)  - Lipitor   Procedures: EEG 9/13-- Rhis is a normal awake and asleep EEG. Please, be aware that a normal EEG does not exclude the possibility of epilepsy.  Clinical correlation is advised.    Consultations:  Neurology  Discharge Exam: Filed Weights   08/05/14 0447 08/07/14 0400  Weight: 69.9 kg (154 lb 1.6 oz) 70.3 kg (154 lb 15.7 oz)   Filed Vitals:   08/07/14 0750  BP: 115/69  Pulse: 68  Temp: 97.4 F (36.3 C)  Resp: 20    General: AAO x 3, no distress Cardiovascular: RRR, no murmurs  Respiratory: clear to auscultation bilaterally GI: soft, non-tender, non-distended, bowel sound positive Neurology: Right arm and leg 4/5 weakness, cranial nerves II through XII are intact,  Discharge Instructions You were cared for by a hospitalist during your hospital stay. If you have any questions about your discharge medications or the care you received while you were in the hospital after you are discharged, you can call the unit and asked to speak with the hospitalist on call if  the hospitalist that took care of you is not available. Once you are discharged, your primary care physician will handle any further medical issues. Please note that NO REFILLS for any discharge medications will be authorized once you are discharged, as it is imperative that you return to your primary care physician (or establish a relationship with  a primary care physician if you do not have one) for your aftercare needs so that they can reassess your need for medications and monitor your lab values.     Medication List    ASK your doctor about these medications       ALPRAZolam 0.25 MG tablet  Commonly known as:  XANAX  Take 0.25 mg by mouth daily as needed for anxiety.     atorvastatin 20 MG tablet  Commonly known as:  LIPITOR  Take 20 mg by mouth every evening.     dipyridamole-aspirin 200-25 MG per 12 hr capsule  Commonly known as:  AGGRENOX  Take 1 capsule by mouth 2 (two) times daily.     docusate sodium 100 MG capsule  Commonly known as:  COLACE  Take 1 capsule (100 mg total) by mouth 2 (two) times daily.     gabapentin 100 MG capsule  Commonly known as:  NEURONTIN  Take 3 capsules (300 mg total) by mouth 2 (two) times daily.     polyethylene glycol packet  Commonly known as:  MIRALAX / GLYCOLAX  Take 17 g by mouth daily as needed for mild constipation.     primidone 250 MG tablet  Commonly known as:  MYSOLINE  Take 1 tablet (250 mg total) by mouth 2 (two) times daily.     traMADol 50 MG tablet  Commonly known as:  ULTRAM  Take 50 mg by mouth at bedtime as needed for moderate pain.     triamcinolone 0.1 % cream : eucerin Crea  Apply 1 application topically 3 (three) times daily as needed for rash.       Allergies  Allergen Reactions  . Tape Rash    Please do not use Plastic Tape   Follow-up Information   Follow up with Xu,Jindong, MD In 2 months. (stroke clinic)    Specialty:  Neurology   Contact information:   [address] [place] [address] [phone]        The results of significant diagnostics from this hospitalization (including imaging, microbiology, ancillary and laboratory) are listed below for reference.    Significant Diagnostic Studies: Dg Chest 2 View  07/18/2014   CLINICAL DATA:  Dyspnea.  EXAM: CHEST  2 VIEW  COMPARISON:  March 17, 2013.  FINDINGS:  Stable cardiomediastinal silhouette. No pneumothorax or pleural effusion is noted. Degenerative disc disease is noted in the lower thoracic spine. No acute pulmonary disease is noted.  IMPRESSION: No acute cardiopulmonary abnormality seen.   Electronically Signed   By: Roque Lias M.D.   On: 07/18/2014 14:10   Dg Hip Complete Left  07/20/2014   CLINICAL DATA:  Left leg pain  EXAM: LEFT HIP - COMPLETE 2+ VIEW  COMPARISON:  None.  FINDINGS: There is no evidence of hip fracture or dislocation. There is mild narrow bilateral hip joint spaces. Osteitis pubis is noted.  IMPRESSION: No acute fracture or dislocation.   Electronically Signed   By: Sherian Rein M.D.   On: 07/20/2014 13:07   Dg Femur Left  07/20/2014   CLINICAL DATA:  Left leg pain, no known injury.  EXAM: LEFT  FEMUR - 2 VIEW  COMPARISON:  None.  FINDINGS: There is no evidence of fracture or dislocation. Soft tissues are unremarkable.  IMPRESSION: No acute fracture or dislocation of the left femur.   Electronically Signed   By: Sherian Rein M.D.   On: 07/20/2014 13:08   Dg Tibia/fibula Left  07/20/2014   CLINICAL DATA:  Left leg pain, no known injury.  EXAM: LEFT TIBIA AND FIBULA - 2 VIEW  COMPARISON:  None.  FINDINGS: There is no evidence of fracture or dislocation. Soft tissues are unremarkable.  IMPRESSION: No acute fracture or dislocation.   Electronically Signed   By: Sherian Rein M.D.   On: 07/20/2014 13:09   Ct Head Wo Contrast  08/05/2014   CLINICAL DATA:  Altered mental status, decreased level of consciousness, recent stroke  EXAM: CT HEAD WITHOUT CONTRAST  TECHNIQUE: Contiguous axial images were obtained from the base of the skull through the vertex without intravenous contrast.  COMPARISON:  MRI brain dated 07/19/2014  FINDINGS: No evidence of parenchymal hemorrhage or extra-axial fluid collection. No mass lesion, mass effect, or midline shift.  No CT evidence of acute infarction. Old left thalamic lacunar infarct.  Subcortical white  matter and periventricular small vessel ischemic changes. Intracranial atherosclerosis.  Mild age related atrophy.  No ventriculomegaly.  Visualized paranasal sinuses are essentially clear. Mastoid air cells are unopacified.  No evidence of calvarial fracture.  IMPRESSION: No evidence of acute intracranial abnormality.  Old left thalamic lacunar infarct.  Mild atrophy with small vessel ischemic changes.   Electronically Signed   By: Charline Bills M.D.   On: 08/05/2014 00:47   Mr Brain Wo Contrast  08/05/2014   CLINICAL DATA:  Weakness on right side of body.  EXAM: MRI HEAD WITHOUT CONTRAST  TECHNIQUE: Multiplanar, multiecho pulse sequences of the brain and surrounding structures were obtained without intravenous contrast.  COMPARISON:  None.  FINDINGS: There is a in 1.1 cm linear focus of restricted diffusion within the lateral aspect of the left thalamus in region of previously identified stroke seen on 07/19/2014. Overall, the diffusion-weighted signal abnormality is less intense and decreased in size relative to prior study (most evident on coronal DWI sequence). Finding may reflect resolving ischemic infarct as diffusion-weighted signal abnormality can persist for up to 10 base (and longer). Possible re-infarction is not entirely excluded. No significant mass effect. Minimal petechial hemorrhage seen within this region on gradient echo sequence.  No other foci of abnormal restricted diffusion are identified. Gray-white matter differentiation maintained. Normal flow voids seen within the intracranial vasculature.  Few additional patchy T2/FLAIR hyperintensities again noted within the periventricular and supratentorial white matter, unchanged. Cerebral volume within normal limits for patient age.  No mass lesion or midline shift. No hydrocephalus. There is no extra-axial fluid collection.  Calvarium and scalp soft tissues are unremarkable.  No acute abnormality seen about either orbit.  Paranasal sinuses  and mastoid air cells are clear.  IMPRESSION: 1. Persistent 1.1 cm focus of linear restricted diffusion within the lateral left thalamus near the posterior limb of the left internal capsule. Finding is favored to reflect resolving infarct from the 07/19/2014 study given the relative decreased signal intensity and size from that prior exam, although possible acute re-infarction of this area is not entirely excluded. 2. No other acute intracranial abnormality.   Electronically Signed   By: Rise Mu M.D.   On: 08/05/2014 23:00   Mr Brain Wo Contrast  07/19/2014   ADDENDUM REPORT: 07/19/2014 13:19  ADDENDUM: Study discussed by telephone with Dr. Onalee Hua Tat on 07/19/2014 at 1254 hrs.   Electronically Signed   By: Augusto Gamble M.D.   On: 07/19/2014 13:19   07/19/2014   CLINICAL DATA:  80 year old female with acute onset right side weakness facial droop and slurred speech. Code stroke. Improved symptoms, patient was not a candidate for tPA. Initial encounter.  EXAM: MRI HEAD WITHOUT CONTRAST  MRA HEAD WITHOUT CONTRAST  TECHNIQUE: Multiplanar, multiecho pulse sequences of the brain and surrounding structures were obtained without intravenous contrast. Angiographic images of the head were obtained using MRA technique without contrast.  COMPARISON:  Cervical spine MRI 03/16/2013.  FINDINGS: MRI HEAD FINDINGS  Oval 14 mm area of restricted diffusion in the lateral left thalamus abutting the posterior limb of the left internal capsule. T2 and FLAIR hyperintensity. No hemorrhage or mass effect. No other restricted diffusion identified. Major intracranial vascular flow voids are preserved. No midline shift, mass effect, evidence of mass lesion, ventriculomegaly, extra-axial collection or acute intracranial hemorrhage. Cervicomedullary junction and pituitary are within normal limits. Negative for age visualized cervical spine. Outside of the acute findings, normal for age gray and white matter signal throughout the  brain. Visible internal auditory structures appear normal. Mastoids are clear. Minor paranasal sinus mucosal thickening. Postoperative changes to the globes. Visualized scalp soft tissues are within normal limits. Normal bone marrow signal.  MRA HEAD FINDINGS  Antegrade flow in the posterior circulation with codominant distal vertebral arteries. Normal vertebrobasilar junction. Normal right PICA origin. Left AICA may be dominant. Mildly tortuous basilar artery, no stenosis. SCA and PCA origins are within normal limits.  The left PCAs occluded in the P2 segment, with no distal left PCA flow (series 605, image 18). Posterior communicating arteries are diminutive or absent. Right PCA branches are within normal limits.  Antegrade flow in both ICA siphons. Mild tortuosity but no siphon stenosis identified. Ophthalmic artery origins are within normal limits. Carotid termini are patent. MCA and ACA origins are within normal limits. Anterior communicating artery is normal. Median artery of the corpus callosum is present. Visualized bilateral ACA branches are within normal limits. Visualized bilateral MCA branches are within normal limits.  IMPRESSION: 1. Acute lacunar infarct (14 mm) in the lateral left thalamus near the posterior limb of the left internal capsule. No mass effect or hemorrhage. 2. The left PCA is occluded in the P2 segment, near the expected location of the left thalamostriate artery origins. 3. Otherwise negative intracranial MRA, and otherwise normal for age non contrast MRI appearance of the brain.  Electronically Signed: By: Augusto Gamble M.D. On: 07/19/2014 12:22   Dg Foot Complete Left  07/20/2014   CLINICAL DATA:  Left leg pain  EXAM: LEFT FOOT - COMPLETE 3+ VIEW  COMPARISON:  None.  FINDINGS: There is no evidence of fracture or dislocation. There is calcification at the insertion of Achilles tendon to the calcaneus. Soft tissues are unremarkable.  IMPRESSION: No acute fracture or dislocation.    Electronically Signed   By: Sherian Rein M.D.   On: 07/20/2014 13:11   Mr Maxine Glenn Head/brain Wo Cm  07/19/2014   ADDENDUM REPORT: 07/19/2014 13:19  ADDENDUM: Study discussed by telephone with Dr. Onalee Hua Tat on 07/19/2014 at 1254 hrs.   Electronically Signed   By: Augusto Gamble M.D.   On: 07/19/2014 13:19   07/19/2014   CLINICAL DATA:  80 year old female with acute onset right side weakness facial droop and slurred speech. Code stroke. Improved symptoms, patient was  not a candidate for tPA. Initial encounter.  EXAM: MRI HEAD WITHOUT CONTRAST  MRA HEAD WITHOUT CONTRAST  TECHNIQUE: Multiplanar, multiecho pulse sequences of the brain and surrounding structures were obtained without intravenous contrast. Angiographic images of the head were obtained using MRA technique without contrast.  COMPARISON:  Cervical spine MRI 03/16/2013.  FINDINGS: MRI HEAD FINDINGS  Oval 14 mm area of restricted diffusion in the lateral left thalamus abutting the posterior limb of the left internal capsule. T2 and FLAIR hyperintensity. No hemorrhage or mass effect. No other restricted diffusion identified. Major intracranial vascular flow voids are preserved. No midline shift, mass effect, evidence of mass lesion, ventriculomegaly, extra-axial collection or acute intracranial hemorrhage. Cervicomedullary junction and pituitary are within normal limits. Negative for age visualized cervical spine. Outside of the acute findings, normal for age gray and white matter signal throughout the brain. Visible internal auditory structures appear normal. Mastoids are clear. Minor paranasal sinus mucosal thickening. Postoperative changes to the globes. Visualized scalp soft tissues are within normal limits. Normal bone marrow signal.  MRA HEAD FINDINGS  Antegrade flow in the posterior circulation with codominant distal vertebral arteries. Normal vertebrobasilar junction. Normal right PICA origin. Left AICA may be dominant. Mildly tortuous basilar artery, no  stenosis. SCA and PCA origins are within normal limits.  The left PCAs occluded in the P2 segment, with no distal left PCA flow (series 605, image 18). Posterior communicating arteries are diminutive or absent. Right PCA branches are within normal limits.  Antegrade flow in both ICA siphons. Mild tortuosity but no siphon stenosis identified. Ophthalmic artery origins are within normal limits. Carotid termini are patent. MCA and ACA origins are within normal limits. Anterior communicating artery is normal. Median artery of the corpus callosum is present. Visualized bilateral ACA branches are within normal limits. Visualized bilateral MCA branches are within normal limits.  IMPRESSION: 1. Acute lacunar infarct (14 mm) in the lateral left thalamus near the posterior limb of the left internal capsule. No mass effect or hemorrhage. 2. The left PCA is occluded in the P2 segment, near the expected location of the left thalamostriate artery origins. 3. Otherwise negative intracranial MRA, and otherwise normal for age non contrast MRI appearance of the brain.  Electronically Signed: By: Augusto Gamble M.D. On: 07/19/2014 12:22    Microbiology: No results found for this or any previous visit (from the past 240 hour(s)).   Labs: Basic Metabolic Panel:  Recent Labs Lab 08/05/14 0020  NA 139  K 4.6  CL 101  CO2 26  GLUCOSE 94  BUN 21  CREATININE 0.77  CALCIUM 8.8   Liver Function Tests:  Recent Labs Lab 08/05/14 0020  AST 29  ALT 35  ALKPHOS 65  BILITOT <0.2*  PROT 6.8  ALBUMIN 3.3*   No results found for this basename: LIPASE, AMYLASE,  in the last 168 hours No results found for this basename: AMMONIA,  in the last 168 hours CBC:  Recent Labs Lab 08/05/14 0020  WBC 6.0  NEUTROABS 2.6  HGB 14.2  HCT 42.6  MCV 91.8  PLT 145*   Cardiac Enzymes: No results found for this basename: CKTOTAL, CKMB, CKMBINDEX, TROPONINI,  in the last 168 hours BNP: BNP (last 3 results) No results found for  this basename: PROBNP,  in the last 8760 hours CBG:  Recent Labs Lab 08/07/14 0742 08/07/14 1212 08/07/14 1316 08/07/14 1526 08/07/14 1658  GLUCAP 87 44* 149* 116* 147*       Signed:  Calvert Cantor, MD  [facility] 08/07/2014, 6:04 PM

## 2014-08-07 NOTE — Progress Notes (Signed)
Physical Therapy Treatment Patient Details Name: [name] [name] MRN: [id number] DOB: 1930/07/01 Today's Date: 08/07/2014    History of Present Illness 80 y.o. female admitted to [facility] on 07/18/14 with R sided weakness, facial droop.  She was transported from [facility] per pt and family request.  Stroke workup in progress.  MRI revealed, "Acute lacunar infarct (14 mm) in the lateral left thalamus near the posterior limb of the left internal capsule.The left PCA is occluded."  Pt with significant PMHx of HTN, DVT, anxiety, essential tremor, DM, peripheral neuropathy, and foot surgery.     PT Comments    Eager to participate and get better, though limited by postural hypotension today (RN, MD made aware); See other PT note of this date for serial BPs during session  Follow Up Recommendations  CIR     Equipment Recommendations  Other (comment) (defer to CIR)    Recommendations for Other Services Rehab consult (on chart; Thanks!)     Precautions / Restrictions Precautions Precautions: Fall Precaution Comments: Postural Hypotension as of 9/13 Restrictions Weight Bearing Restrictions: No    Mobility  Bed Mobility               General bed mobility comments: Sitting in chair upon PT arrival.   Transfers Overall transfer level: Needs assistance Equipment used: Rolling walker (2 wheeled) Transfers: Sit to/from Stand Sit to Stand: Mod assist;+2 safety/equipment (+2 helpful)         General transfer comment: Stood from low recliner multiple reps with +2 assist and verbal and tactile cueing for safety, hand placement, to initiate with forward lean, and to push down into floor with bil LEs; tactile cues as well for quad activation RLE for stance stability, and to fully extend hips and knees for upright posture  Ambulation/Gait Ambulation/Gait assistance: +2 physical assistance;Mod assist Ambulation Distance (Feet):  (10 steps total, forward and backward, in 2  different bouts) Assistive device: Rolling walker (2 wheeled) Gait Pattern/deviations: Step-to pattern     General Gait Details: No buckling noted today RLE; continued difficulty advancing RLE, with noted decr dorsiflexion; upright activity and amb limited today, unfortunately, because of decr standing tolerance with postural hypotension   Stairs            Wheelchair Mobility    Modified Rankin (Stroke Patients Only) Modified Rankin (Stroke Patients Only) Pre-Morbid Rankin Score:  (unknown.) Modified Rankin: Moderately severe disability     Balance Overall balance assessment: Needs assistance         Standing balance support: Bilateral upper extremity supported Standing balance-Leahy Scale: Poor Standing balance comment: Continued need for bil UE support                    Cognition Arousal/Alertness: Awake/alert Behavior During Therapy: WFL for tasks assessed/performed Overall Cognitive Status: Within Functional Limits for tasks assessed                      Exercises General Exercises - Lower Extremity Ankle Circles/Pumps: AROM;Both;10 reps;Seated Quad Sets: AROM;Both;10 reps;Seated    General Comments        Pertinent Vitals/Pain Pain Assessment: No/denies pain Pain Descriptors / Indicators: Grimacing (Pt states she is grimacing because of effort, not pain) Pain Intervention(s): Limited activity within patient's tolerance    Home Living                      Prior Function  PT Goals (current goals can now be found in the care plan section) Acute Rehab PT Goals Patient Stated Goal: Eager to work and get better PT Goal Formulation: With patient/family Time For Goal Achievement: 08/20/14 Potential to Achieve Goals: Good Progress towards PT goals: Progressing toward goals (though slowly)    Frequency  Min 4X/week    PT Plan Current plan remains appropriate    Co-evaluation             End of Session  Equipment Utilized During Treatment: Gait belt Activity Tolerance: Other (comment) (limited by postural hypotension) Patient left: in chair;with call bell/phone within reach;with family/visitor present     Time: 4098-1191 PT Time Calculation (min): 47 min  Charges:  $Gait Training: 8-22 mins $Therapeutic Exercise: 8-22 mins $Therapeutic Activity: 8-22 mins                    G Codes:      Olen Pel 08/07/2014, 10:52 AM  Van Clines, PT  Acute Rehabilitation Services Pager [phone] Office [phone]

## 2014-08-07 NOTE — Progress Notes (Signed)
Physical Therapy Treatment Note  Serial BPs as follows:   08/07/14 1035 08/07/14 1036  Orthostatic Sitting  BP- Sitting 136/63 mmHg 120/58 mmHg  Pulse- Sitting 86 81 (Post standing)  Orthostatic Standing at 0 minutes  BP- Standing at 0 minutes 110/73 mmHg --   Pulse- Standing at 0 minutes 109 (Pt symptomatic for dizziness) --     See also full PT note of this date for other details of session; Continue to recommend comprehensive inpatient rehab (CIR) for post-acute therapy needs.   Thanks;  Van Clines, PT  Acute Rehabilitation Services Pager [phone] Office [phone]

## 2014-08-08 DIAGNOSIS — Z5189 Encounter for other specified aftercare: Secondary | ICD-10-CM

## 2014-08-08 DIAGNOSIS — I69993 Ataxia following unspecified cerebrovascular disease: Secondary | ICD-10-CM

## 2014-08-08 LAB — GLUCOSE, CAPILLARY
GLUCOSE-CAPILLARY: 99 mg/dL (ref 70–99)
Glucose-Capillary: 86 mg/dL (ref 70–99)
Glucose-Capillary: 77 mg/dL (ref 70–99)
Glucose-Capillary: 109 mg/dL — ABNORMAL HIGH (ref 70–99)

## 2014-08-08 NOTE — Progress Notes (Signed)
Occupational Therapy Treatment Patient Details Name: [name] [name] MRN: [id number] DOB: 12/03/1929 Today's Date: 08/08/2014    History of present illness 80 y.o. female admitted to [facility] on 07/18/14 with R sided weakness, facial droop.  She was transported from [place]  to [facility] per pt and family request. Pt went to CIR then home with acute increased weakness and shaking on 9/10 with readmission. MRI 9/11 revealed, "Acute lacunar infarct (14 mm) from 8/25 in the lateral left thalamus/posterior internal capsule and that "acute re-infarction in this area can not be excluded".  Pt with significant PMHx of HTN, DVT, anxiety, essential tremor, DM, peripheral neuropathy, and foot surgery.    OT comments  Pt making steady progress. Notable increase in RUE strength and ability to participate with simple ADL as compared to initial eval. Pt c/o "dizziness", which appears to be worse with positional changes. Recommend vestibular eval in am to assess possible BPPV. If + BPPV, recommend reassessing pt after treatment if applicable to determine further need for CIR. Will continue to follow to facilitate appropriate D/C planning and maximize funcitonal levelof independence.   Follow Up Recommendations  CIR    Equipment Recommendations  3 in 1 bedside comode    Recommendations for Other Services      Precautions / Restrictions Precautions Precautions: Fall Precaution Comments: postural dizziness but not hypotensive 9/14       Mobility Bed Mobility                  Transfers Overall transfer level: Needs assistance   Transfers: Sit to/from Stand Sit to Stand: Mod assist Stand pivot transfers: Mod assist       General transfer comment: increased time required. R bias during transfers    Balance Overall balance assessment: Needs assistance   Sitting balance-Leahy Scale: Fair       Standing balance-Leahy Scale: Poor Standing balance comment: R bias                    ADL   Eating/Feeding: Minimal assistance (pt able to bring cup to mouth with )   Grooming: Moderate assistance;Sitting   Upper Body Bathing: Moderate assistance;Sitting   Lower Body Bathing: Moderate assistance;Sit to/from stand   Upper Body Dressing : Moderate assistance;Sitting   Lower Body Dressing: Moderate assistance;Sit to/from stand               Functional mobility during ADLs: Moderate assistance (for transfers only. will need +2 for ambulatioin) General ADL Comments: improving ability to complete hand to mouth and increase in general mobility. However, pt with increased c/o dizziness. Appears to be worse with postural changes      Vision      appears intact               Perception     Praxis      Cognition   Behavior During Therapy: WFL for tasks assessed/performed Overall Cognitive Status: Difficult to assess                       Extremity/Trunk Assessment               Exercises Other Exercises Other Exercises: encouraged RUE self ROM, general AROM BUE, [place] AROM BLE   Shoulder Instructions       General Comments      Pertinent Vitals/ Pain       Pain Assessment: Faces Faces Pain Scale: Hurts little more Pain Location: head  Pain Intervention(s): Limited activity within patient's tolerance;Monitored during session  Home Living                                          Prior Functioning/Environment              Frequency Min 2X/week     Progress Toward Goals  OT Goals(current goals can now be found in the care plan section)  Progress towards OT goals: Progressing toward goals  Acute Rehab OT Goals Patient Stated Goal: Eager to work and get better OT Goal Formulation: With patient Time For Goal Achievement: 08/20/14 Potential to Achieve Goals: Good ADL Goals Pt Will Perform Eating: with min assist;sitting;with adaptive utensils Pt Will Perform Grooming: with min assist;sitting Pt  Will Perform Upper Body Bathing: with min assist;sitting Pt Will Transfer to Toilet: with min assist;bedside commode;stand pivot transfer Additional ADL Goal #1: Use R hand as a functional A during ADL  Plan Discharge plan remains appropriate    Co-evaluation                 End of Session     Activity Tolerance Patient tolerated treatment well   Patient Left in chair;with call bell/phone within reach   Nurse Communication Mobility status        Time: 4098-1191 OT Time Calculation (min): 26 min  Charges: OT General Charges $OT Visit: 1 Procedure OT Treatments $Self Care/Home Management : 23-37 mins  Kadeen Sroka,HILLARY 08/08/2014, 5:21 PM   [facility], OTR/L  [phone] 08/08/2014

## 2014-08-08 NOTE — Progress Notes (Signed)
CSW (Clinical Child psychotherapist) made aware that pt will not be able to dc to CIR. CSW visited pt room and spoke with pt son. Pt son aware of CIR denial and informed CSW they will not accept this. Pt son wanting CIR decision as well as discharge decision to be reviewed and plans to appeal. Pt son informed CSW he has already started these processes. CSW asked what pt son would like to happen at dc it pt can still not dc upstairs. Pt son denied SNF and informed CSW that pt would dc home with him. CSW notified RNCM of conversation. Pt has no further hospital social work needs at this time, CSW signing off.  Britiny Defrain, LCSWA [phone]

## 2014-08-08 NOTE — Consult Note (Signed)
Physical Medicine and Rehabilitation Consult Reason for Consult: Left thalamic infarct Referring Physician: [facility]   HPI: [name] [name] is a 80 y.o. right-handed female with history of hypertension, diabetes mellitus peripheral neuropathy as well as recent left thalamus posterior limb of the internal capsule infarct received inpatient rehabilitation services 07/21/2014-07/30/2014 and was discharged to home on Aggrenox ambulating 300 feet with a rolling walker and supervision. Presented 08/05/2014 with increased right-sided weakness. MRI of the brain showed no new stroke but a recent subacute infarct. No TPA was initiated. Patient with bouts of headache felt to be related to her Aggrenox. There was some concerns of possible simple seizure however EEG was negative. She was maintained on prophylactic. Neurology services consulted with followup. Aggrenox was changed to Plavix for CVA prophylaxis. Subcutaneous heparin for DVT prophylaxis. She is tolerating a regular consistency diet. Physical therapy evaluation completed with recommendations for physical medicine rehabilitation consult.  Patient currently requiring moderate assistance for transfers and min assist x2 for ambulation PT has noted possible vestibular disorder and is utilizing gaze stabilization techniques ROS Past Medical History  Diagnosis Date  . Hypertension   . High cholesterol   . DVT (deep venous thrombosis)   . Pneumonia   . Anxiety   . Essential tremor 10/27/2013  . Obesity, unspecified 10/27/2013  . Newly diagnosed diabetes 10/27/2013  . Unspecified hereditary and idiopathic peripheral neuropathy 10/27/2013   Past Surgical History  Procedure Laterality Date  . Foot surgery    . Cataract extraction Bilateral    Family History  Problem Relation Age of Onset  . Coronary artery disease Other    Social History:  reports that she has never smoked. She does not have any smokeless tobacco history on file. She reports  that she does not drink alcohol or use illicit drugs. Allergies:  Allergies  Allergen Reactions  . Tape Rash    Please do not use Plastic Tape   Medications Prior to Admission  Medication Sig Dispense Refill  . ALPRAZolam (XANAX) 0.25 MG tablet Take 0.25 mg by mouth daily as needed for anxiety.      [name] atorvastatin (LIPITOR) 20 MG tablet Take 20 mg by mouth every evening.       . dipyridamole-aspirin (AGGRENOX) 200-25 MG per 12 hr capsule Take 1 capsule by mouth 2 (two) times daily.      [name] docusate sodium (COLACE) 100 MG capsule Take 1 capsule (100 mg total) by mouth 2 (two) times daily.  10 capsule  0  . gabapentin (NEURONTIN) 100 MG capsule Take 3 capsules (300 mg total) by mouth 2 (two) times daily.  90 capsule  1  . polyethylene glycol (MIRALAX / GLYCOLAX) packet Take 17 g by mouth daily as needed for mild constipation.      . primidone (MYSOLINE) 250 MG tablet Take 1 tablet (250 mg total) by mouth 2 (two) times daily.  180 tablet  3  . traMADol (ULTRAM) 50 MG tablet Take 50 mg by mouth at bedtime as needed for moderate pain.      . Triamcinolone Acetonide (TRIAMCINOLONE 0.1 % CREAM : EUCERIN) CREA Apply 1 application topically 3 (three) times daily as needed for rash.        Home: Home Living Family/patient expects to be discharged to:: Inpatient rehab Living Arrangements: Other relatives Available Help at Discharge: Family;Available 24 hours/day Type of Home: House Home Access: Stairs to enter [employer] of Steps: 1 in back, 5 in front Entrance Stairs-Rails: Can  reach both Home Layout: One level Home Equipment: Walker - 2 wheels;Tub bench Additional Comments: Pt speaks limited english. Family present and interpreted for evaluation.   Functional History: Prior Function Level of Independence: Needs assistance Gait / Transfers Assistance Needed: Ambulate with RW and supervision level PTA. Recently d/ced from CIR last Saturday 9/5 per family. ADL's / Homemaking  Assistance Needed: family assisted @ 50% with dressing. able to feed and bath self after set up Communication / Swallowing Assistance Needed: no straw use Comments: active, likes to exercise, cook, knitting Functional Status:  Mobility: Bed Mobility Overal bed mobility: Needs Assistance Bed Mobility: Supine to Sit Supine to sit: Max assist General bed mobility comments: Sitting in chair upon PT arrival.  Transfers Overall transfer level: Needs assistance Equipment used: Rolling walker (2 wheeled) Transfers: Sit to/from Stand Sit to Stand: Mod assist;+2 safety/equipment (+2 helpful) Stand pivot transfers: Max assist General transfer comment: Stood from low recliner multiple reps with +2 assist and verbal and tactile cueing for safety, hand placement, to initiate with forward lean, and to push down into floor with bil LEs; tactile cues as well for quad activation RLE for stance stability, and to fully extend hips and knees for upright posture Ambulation/Gait Ambulation/Gait assistance: +2 physical assistance;Mod assist Ambulation Distance (Feet):  (10 steps total, forward and backward, in 2 different bouts) Assistive device: Rolling walker (2 wheeled) Gait Pattern/deviations: Step-to pattern Gait velocity: decreased Gait velocity interpretation: Below normal speed for age/gender General Gait Details: No buckling noted today RLE; continued difficulty advancing RLE, with noted decr dorsiflexion; upright activity and amb limited today, unfortunately, because of decr standing tolerance with postural hypotension    ADL: ADL Overall ADL's : Needs assistance/impaired Eating/Feeding: Maximal assistance Grooming: Maximal assistance;Sitting Upper Body Bathing: Maximal assistance;Sitting Lower Body Bathing: Maximal assistance;Sit to/from stand Upper Body Dressing : Maximal assistance;Sitting Lower Body Dressing: Total assistance;Sit to/from stand Toilet Transfer: Maximal assistance Toileting-  Clothing Manipulation and Hygiene: Maximal assistance Functional mobility during ADLs: +2 for physical assistance;Moderate assistance General ADL Comments: functional decline since D/C from CIR  Cognition: Cognition Overall Cognitive Status: Within Functional Limits for tasks assessed Orientation Level: Oriented X4 Cognition Arousal/Alertness: Awake/alert Behavior During Therapy: WFL for tasks assessed/performed Overall Cognitive Status: Within Functional Limits for tasks assessed  Blood pressure 111/51, pulse 76, temperature 98.4 F (36.9 C), temperature source Oral, resp. rate 16, height  (1.575 m), weight 70.3 kg (154 lb 15.7 oz), SpO2 95.00%. Physical Exam  Nursing note and vitals reviewed. Constitutional: She appears well-developed and well-nourished.  HENT:  Head: Normocephalic and atraumatic.  Eyes: Conjunctivae are normal. Pupils are equal, round, and reactive to light.  Neck: Normal range of motion.  Neurological: She is alert. She displays tremor. No cranial nerve deficit or sensory deficit. She exhibits normal muscle tone. Coordination abnormal.  3/5 right deltoid, bicep, tricep, grip 4 minus hip flexor knee extensor ankle dorsiflexor  Bilateral upper extremity tremor  Dysmetria with finger to nose to finger testing  No evidence of nystagmus  Psychiatric: She has a normal mood and affect.    Results for orders placed during the hospital encounter of 08/04/14 (from the past 24 hour(s))  GLUCOSE, CAPILLARY     Status: None   Collection Time    08/07/14  7:42 AM      Result Value Ref Range   Glucose-Capillary 87  70 - 99 mg/dL  GLUCOSE, CAPILLARY     Status: Abnormal   Collection Time    08/07/14 12:12 PM  Result Value Ref Range   Glucose-Capillary 44 (*) 70 - 99 mg/dL  GLUCOSE, CAPILLARY     Status: Abnormal   Collection Time    08/07/14  1:16 PM      Result Value Ref Range   Glucose-Capillary 149 (*) 70 - 99 mg/dL  GLUCOSE, CAPILLARY     Status:  Abnormal   Collection Time    08/07/14  3:26 PM      Result Value Ref Range   Glucose-Capillary 116 (*) 70 - 99 mg/dL  GLUCOSE, CAPILLARY     Status: Abnormal   Collection Time    08/07/14  4:58 PM      Result Value Ref Range   Glucose-Capillary 147 (*) 70 - 99 mg/dL  GLUCOSE, CAPILLARY     Status: Abnormal   Collection Time    08/07/14  8:27 PM      Result Value Ref Range   Glucose-Capillary 105 (*) 70 - 99 mg/dL   No results found.  Assessment/Plan: Diagnosis: Recent left subcortical infarct with decline in function which may relate to Post CVA seizure or vestibular disorder 1. Does the need for close, 24 hr/day medical supervision in concert with the patient's rehab needs make it unreasonable for this patient to be served in a less intensive setting? Potentially 2. Co-Morbidities requiring supervision/potential complications: Hx HTN 3. Due to safety and patient education, does the patient require 24 hr/day rehab nursing? Potentially 4. Does the patient require coordinated care of a physician, rehab nurse, PT (1-2 hrs/day, 5 days/week) and OT (1-2 hrs/day, 5 days/week) to address physical and functional deficits in the context of the above medical diagnosis(es)? Potentially Addressing deficits in the following areas: balance, endurance, locomotion, strength, transferring, bathing, dressing and toileting 5. Can the patient actively participate in an intensive therapy program of at least 3 hrs of therapy per day at least 5 days per week? Yes 6. The potential for patient to make measurable gains while on inpatient rehab is good 7. Anticipated functional outcomes upon discharge from inpatient rehab are supervision  with PT, supervision with OT, n/a with SLP. 8. Estimated rehab length of stay to reach the above functional goals is: 5-7 d 9. Does the patient have adequate social supports to accommodate these discharge functional goals? Yes 10. Anticipated D/C setting: Home 11. Anticipated  post D/C treatments: HH therapy 12. Overall Rehab/Functional Prognosis: good  RECOMMENDATIONS: This patient's condition is appropriate for continued rehabilitative care in the following setting: CIR is not at supervision level by 9/15 Patient has agreed to participate in recommended program. Yes Note that insurance prior authorization may be required for reimbursement for recommended care.  Comment: If patient had a seizure, Todd's paralysis should resolve quickly. Some improvement today in functional status since prior PT notes. If patient is not at baseline tomorrow then CIR recommended    08/08/2014

## 2014-08-08 NOTE — Progress Notes (Signed)
[facility] Progress Note   SHAWNEA VIRGA UJW:[id number] DOB: 06/01/1930 DOA: 08/04/2014 PCP: Gwynneth Aliment, MD  Brief narrative: [name] [name] is a 80 y.o. female with a past medical history of hypertension, diabetes, recently treated thalamic stroke with symptoms of slurred speech and right sided weakness (07/18/14) who presents with increasing weakness on the right side of her body.  Subjective: Weakness in right arm and leg continues-   Assessment/Plan: Principal Problem:   Right sided weakness - work up does not reveal a recurrent infarct- pt will need further PT- awaiting PT eval - was placed on Aggrenox after initial CVA due to ongoing headaches she has been switched to Plavix - CIR is hesitant to take her back, saying she is a minimal assist now. Family is refusing to take her to a SNF or to take her home and are going the appeal the decision made by CIR  Active Problems:   DM2 (diabetes mellitus, type 2) - has been overall controlled and Metformin was discontinued - check CBGs while in hospital - one episode of hypoglycemia yesterday- doubt this was accurate- explained to family in detail  Headaches - improved after switch to Plavix    HLD (hyperlipidemia) - Lipitor  Code Status: Full code Family Communication: with daughter and husband at bedside who interpreted for me Disposition Plan: to be determined DVT prophylaxis: Heparin  Consultants: Neuro  Procedures: none  Antibiotics: Anti-infectives   None     Objective: Filed Weights   08/05/14 0447 08/07/14 0400  Weight: 69.9 kg (154 lb 1.6 oz) 70.3 kg (154 lb 15.7 oz)    Intake/Output Summary (Last 24 hours) at 08/08/14 1247 Last data filed at 08/08/14 0551  Gross per 24 hour  Intake    240 ml  Output   1050 ml  Net   -810 ml     Vitals Filed Vitals:   08/07/14 0750 08/07/14 2000 08/08/14 0000 08/08/14 0400  BP: 115/69 122/58 105/39 111/51  Pulse: 68 73 70 76  Temp: 97.4 F (36.3  C) 97.7 F (36.5 C) 98.1 F (36.7 C) 98.4 F (36.9 C)  TempSrc: Oral Oral Oral   Resp: 20 16 16 16   Height:      Weight:      SpO2: 98% 98% 97% 95%    Exam: General: No acute respiratory distress Lungs: Clear to auscultation bilaterally without wheezes or crackles Cardiovascular: Regular rate and rhythm without murmur gallop or rub normal S1 and S2 Abdomen: Nontender, nondistended, soft, bowel sounds positive, no rebound, no ascites, no appreciable mass Extremities: No significant cyanosis, clubbing, or edema bilateral lower extremities Neuro: 4/5 weakness in right arm and leg- CN 2-12 intact  Data Reviewed: Basic Metabolic Panel:  Recent Labs Lab 08/05/14 0020  NA 139  K 4.6  CL 101  CO2 26  GLUCOSE 94  BUN 21  CREATININE 0.77  CALCIUM 8.8   Liver Function Tests:  Recent Labs Lab 08/05/14 0020  AST 29  ALT 35  ALKPHOS 65  BILITOT <0.2*  PROT 6.8  ALBUMIN 3.3*   No results found for this basename: LIPASE, AMYLASE,  in the last 168 hours No results found for this basename: AMMONIA,  in the last 168 hours CBC:  Recent Labs Lab 08/05/14 0020  WBC 6.0  NEUTROABS 2.6  HGB 14.2  HCT 42.6  MCV 91.8  PLT 145*   Cardiac Enzymes: No results found for this basename: CKTOTAL, CKMB, CKMBINDEX, TROPONINI,  in the last 168 hours  BNP (last 3 results) No results found for this basename: PROBNP,  in the last 8760 hours CBG:  Recent Labs Lab 08/07/14 1526 08/07/14 1658 08/07/14 2027 08/08/14 0717 08/08/14 1153  GLUCAP 116* 147* 105* 86 77    No results found for this or any previous visit (from the past 240 hour(s)).   Studies:  Recent x-ray studies have been reviewed in detail by the Attending Physician  Scheduled Meds:  Scheduled Meds: .  stroke: mapping our early stages of recovery book   Does not apply Once  . atorvastatin  20 mg Oral QPM  . clopidogrel  75 mg Oral Daily  . docusate sodium  100 mg Oral BID  . gabapentin  300 mg Oral BID  .  heparin  5,000 Units Subcutaneous 3 times per day  . levETIRAcetam  500 mg Oral BID  . primidone  250 mg Oral BID   Continuous Infusions:   Time spent on care of this patient: 35 min   Danyka Merlin, MD 08/08/2014, 12:47 PM  LOS: 4 days   [facility] Office  [phone] Pager - Text Page per [email or web address]  If 7PM-7AM, please contact night-coverage [email or web address]

## 2014-08-08 NOTE — Progress Notes (Signed)
Rehab admissions - Pt is known to our rehab team as she was discharged from inpatient rehab on 08-02-14.  I met with pt in follow up to rehab MD consult and she stated, "My head is heavy. I am dizzy." I explained that our rehab MD wants to check her progress today and tomorrow and that if she is not at a supervision level tomorrow, we could consider another inpatient rehab stay. There were no family members in the room. Pt was agreeable to me calling her son.  Per rehab MD on 08-08-14: If patient is not at baseline tomorrow then CIR recommended.  I then called and spoke with pt's son Kathrine Cords and shared this same update. He understood the plan to check her progress tomorrow and proceed from there. Son is anticipating pt's return to inpatient rehab.   I will follow pt's case tomorrow and see how she progresses after therapies on Tuesday. I have been in communication with Hassan Rowan, case management about pt's case.  Please call me with any questions. Thanks.  Nanetta Batty, PT Rehabilitation Admissions Coordinator [phone]

## 2014-08-08 NOTE — Progress Notes (Signed)
Physical Therapy Treatment Patient Details Name: [name] [name] MRN: [id number] DOB: Oct 30, 1930 Today's Date: 08/08/2014    History of Present Illness 80 y.o. female admitted to [facility] on 07/18/14 with R sided weakness, facial droop.  She was transported from [place] to [facility] per pt and family request. MRI revealed, "Acute lacunar infarct (14 mm) in the lateral left thalamus. Pt went to CIR then home with acute increased weakness and shaking on 9/10 with readmission, MRI without further acute events, sz.  Pt with significant PMHx of HTN, DVT, anxiety, essential tremor, DM, peripheral neuropathy, and foot surgery.     PT Comments    Pt moving well and continues to report postural dizziness but unrelated to orthostatic hypotension. Pt may have a vestibular dysfunction post stroke as reported as a vertical spinning sensation with position change. Pt educated for HEP, gait and gaze stabilization and encouraged to continue. Family present throughout to interpret and provide hx. Will continue to follow.   Follow Up Recommendations  CIR     Equipment Recommendations       Recommendations for Other Services       Precautions / Restrictions Precautions Precautions: Fall Precaution Comments: postural dizziness but not hypotensive 9/14    Mobility  Bed Mobility Overal bed mobility: Needs Assistance Bed Mobility: Rolling;Sidelying to Sit Rolling: Min assist Sidelying to sit: Min assist       General bed mobility comments: cues for sequence and assist to fully elevate trunk with initial dizziness with transfer to sitting  Transfers Overall transfer level: Needs assistance     Sit to Stand: Mod assist         General transfer comment: cues for hand placement and sequence to rise from bed  Ambulation/Gait Ambulation/Gait assistance: Min assist;+2 safety/equipment Ambulation Distance (Feet): 53 Feet Assistive device: Rolling walker (2 wheeled) Gait Pattern/deviations:  Step-through pattern;Decreased stride length   Gait velocity interpretation: Below normal speed for age/gender General Gait Details: Pt with cues for posture, position in RW and gaze stabilization. chair followed for safety and fatigue   Stairs            Wheelchair Mobility    Modified Rankin (Stroke Patients Only) Modified Rankin (Stroke Patients Only) Pre-Morbid Rankin Score: Moderate disability Modified Rankin: Moderately severe disability     Balance Overall balance assessment: Needs assistance   Sitting balance-Leahy Scale: Fair       Standing balance-Leahy Scale: Poor                      Cognition Arousal/Alertness: Awake/alert Behavior During Therapy: WFL for tasks assessed/performed Overall Cognitive Status: Difficult to assess                      Exercises General Exercises - Lower Extremity Long Arc Quad: AROM;AAROM;Right;Left;15 reps (AAROM on RLe) Hip Flexion/Marching: AROM;AAROM;Right;Left;15 reps (AAROM on RLe)    General Comments        Pertinent Vitals/Pain Pain Assessment: No/denies pain Orthostatic BPs  Supine 130/54, HR 82  Sitting 138/75 HR 96     Standing 136/66 HR 116          Home Living                      Prior Function            PT Goals (current goals can now be found in the care plan section) Progress towards PT goals: Progressing toward goals  Frequency       PT Plan Current plan remains appropriate    Co-evaluation             End of Session Equipment Utilized During Treatment: Gait belt Activity Tolerance: Patient tolerated treatment well Patient left: in chair;with call bell/phone within reach;with family/visitor present     Time: 4098-1191 PT Time Calculation (min): 28 min  Charges:  $Gait Training: 8-22 mins $Therapeutic Exercise: 8-22 mins                    G Codes:      Delorse Lek 2014-08-09, 8:58 AM Delaney Meigs, PT [phone]

## 2014-08-09 LAB — GLUCOSE, CAPILLARY
GLUCOSE-CAPILLARY: 101 mg/dL — AB (ref 70–99)
Glucose-Capillary: 86 mg/dL (ref 70–99)
Glucose-Capillary: 92 mg/dL (ref 70–99)
Glucose-Capillary: 123 mg/dL — ABNORMAL HIGH (ref 70–99)

## 2014-08-09 NOTE — Progress Notes (Signed)
Rehab admissions - We have been following pt's case and I made note of latest OT note. Rehab team has discussed pt's case and rehab medical director feels that pt's needs would best be met at a skilled nursing facility or home with home health support. I met with pt and 2 sons this am to discuss the recommendation for SNF/home with home health. Questions were answered and it was the family's preference for the pt to return to inpatient rehab.  I explained that case management and social work would follow up with pt/family to help with discharge planning. I updated [place] with social work and left message with Hassan Rowan, Tourist information centre manager.  I will now sign off pt's case. Please call me with any questions. Thanks.  Nanetta Batty, PT Rehabilitation Admissions Coordinator [phone]

## 2014-08-09 NOTE — Progress Notes (Addendum)
Physical Therapy Treatment Patient Details Name: [name] [name] MRN: [id number] DOB: Mar 22, 1930 Today's Date: 08/09/2014    History of Present Illness 80 y.o. female admitted to [facility] on 07/18/14 with R sided weakness, facial droop.  She was transported from [place] to [facility] per pt and family request. MRI revealed, "Acute lacunar infarct (14 mm) in the lateral left thalamus. Pt went to CIR then home with acute increased weakness and shaking on 9/10 with readmission, MRI without further acute events, sz.  Pt with significant PMHx of HTN, DVT, anxiety, essential tremor, DM, peripheral neuropathy, and foot surgery.     PT Comments    Pt pleasant with continued report of spinning with positional changes. Pt reports horizontal spinning right to left today and assessed Dix-Hallpike bil with negative result. Performed supine head roll for horizontal canal with pt reporting positive symptoms to right but no nystagmus noted. Performed Semont maneuver Right to left x 2 with pt reporting improvement in symptoms with bed mobility and sitting. With transfer sitting to standing and ambulation pt noted slight increase in dizziness and 2 second abrupt significant increase with standing to sitting after ambulation. Pt with difficulty discerning spinning sensation and direction with inconsistent responses more prevalent with postural changes as they were prior session without orthostasis. Pt does report improvement in symptoms after tx and will continue to follow and assess pt vestibular dysfunction and mobility. Son present, educated and translating throughout. Pt instructed not to lie flat or perform rapid head movement for next 24hrs. Will continue to follow.   Follow Up Recommendations  CIR      Equipment Recommendations       Recommendations for Other Services Rehab consult     Precautions / Restrictions Precautions Precautions: Fall Precaution Comments: postural dizziness Restrictions Weight  Bearing Restrictions: No    Mobility  Bed Mobility Overal bed mobility: Needs Assistance Bed Mobility: Supine to Sit;Sit to Supine     Supine to sit: Mod assist Sit to supine: Min assist   General bed mobility comments: cues for sequence and assist to elevate trunk and pivot legs fully. Assist for elevating trunk from surface to long sitting with vestibular testing  Transfers Overall transfer level: Needs assistance   Transfers: Sit to/from Stand Sit to Stand: Min assist         General transfer comment: increased time with cues for hand placement and safety  Ambulation/Gait Ambulation/Gait assistance: Min assist Ambulation Distance (Feet): 30 Feet Assistive device: Rolling walker (2 wheeled) Gait Pattern/deviations: Step-through pattern;Decreased stride length   Gait velocity interpretation: Below normal speed for age/gender General Gait Details: Pt with cues for posture, position in RW and gaze stabilization, slow gait   Stairs            Wheelchair Mobility    Modified Rankin (Stroke Patients Only) Modified Rankin (Stroke Patients Only) Pre-Morbid Rankin Score: Moderate disability Modified Rankin: Moderately severe disability     Balance Overall balance assessment: Needs assistance   Sitting balance-Leahy Scale: Fair       Standing balance-Leahy Scale: Poor                      Cognition Arousal/Alertness: Awake/alert Behavior During Therapy: WFL for tasks assessed/performed Overall Cognitive Status: Difficult to assess                      Exercises      General Comments        Pertinent  Vitals/Pain Pain Score: 2  Pain Location: heavy head Pain Descriptors / Indicators: Aching Pain Intervention(s): Repositioned    Home Living                      Prior Function            PT Goals (current goals can now be found in the care plan section) Progress towards PT goals: Progressing toward goals     Frequency       PT Plan Current plan remains appropriate    Co-evaluation             End of Session Equipment Utilized During Treatment: Gait belt Activity Tolerance: Patient tolerated treatment well Patient left: in chair;with call bell/phone within reach;with family/visitor present     Time: 1914-7829 PT Time Calculation (min): 47 min  Charges:  $Therapeutic Activity: 23-37 mins $Canalith Rep Proc: 8-22 mins                    G Codes:      Delorse Lek 08-19-2014, 11:35 AM Delaney Meigs, PT [phone]

## 2014-08-10 ENCOUNTER — Telehealth: Payer: Self-pay

## 2014-08-10 DIAGNOSIS — G250 Essential tremor: Secondary | ICD-10-CM

## 2014-08-10 DIAGNOSIS — G252 Other specified forms of tremor: Secondary | ICD-10-CM

## 2014-08-10 LAB — GLUCOSE, CAPILLARY
Glucose-Capillary: 105 mg/dL — ABNORMAL HIGH (ref 70–99)
Glucose-Capillary: 99 mg/dL (ref 70–99)
Glucose-Capillary: 117 mg/dL — ABNORMAL HIGH (ref 70–99)
Glucose-Capillary: 251 mg/dL — ABNORMAL HIGH (ref 70–99)

## 2014-08-10 MED ORDER — NAPHAZOLINE HCL 0.1 % OP SOLN
1.0000 [drp] | Freq: Two times a day (BID) | OPHTHALMIC | Status: DC
Start: 2014-08-10 — End: 2014-08-11
  Administered 2014-08-10 – 2014-08-11 (×2): 1 [drp] via OPHTHALMIC

## 2014-08-10 MED ORDER — CLOPIDOGREL BISULFATE 75 MG PO TABS: 75.0000 mg | ORAL_TABLET | Freq: Every day | ORAL | Status: DC

## 2014-08-10 MED ORDER — ALPRAZOLAM 0.25 MG PO TABS: 0.2500 mg | ORAL_TABLET | Freq: Every day | ORAL | Status: DC | PRN

## 2014-08-10 MED ORDER — LEVETIRACETAM 500 MG PO TABS: 500.0000 mg | ORAL_TABLET | Freq: Two times a day (BID) | ORAL | Status: DC

## 2014-08-10 MED ORDER — NAPHAZOLINE HCL 0.1 % OP SOLN
1.0000 [drp] | Freq: Four times a day (QID) | OPHTHALMIC | Status: DC | PRN
  Filled 2014-08-10: qty 15

## 2014-08-10 MED ORDER — NAPHAZOLINE HCL 0.1 % OP SOLN: 1.0000 [drp] | Freq: Four times a day (QID) | OPHTHALMIC | Status: DC

## 2014-08-10 NOTE — Progress Notes (Signed)
SLP Cancellation Note  Patient Details Name: DEZIREA MCCOLLISTER MRN: [id number] DOB: 10/30/1930   Cancelled treatment:       Reason Eval/Treat Not Completed: SLP screened, no needs identified for cognition/speech/language.  However, son reports difficulty swallowing thin liquids with intermittent cough.  Pt may benefit from clinical swallow eval prior to D/C.    [name] 08/10/2014, 1:57 PM

## 2014-08-10 NOTE — Progress Notes (Signed)
Physical Therapy Treatment Patient Details Name: [name] [name] MRN: [id number] DOB: 1930-07-23 Today's Date: 08/10/2014    History of Present Illness 80 y.o. female admitted to [facility] on 07/18/14 with R sided weakness, facial droop.  She was transported from [place] to [facility] per pt and family request. MRI revealed, "Acute lacunar infarct (14 mm) in the lateral left thalamus. Pt went to CIR then home with acute increased weakness and shaking on 9/10 with readmission, MRI without further acute events, sz.  Pt with significant PMHx of HTN, DVT, anxiety, essential tremor, DM, peripheral neuropathy, and foot surgery.     PT Comments    Pt's dizziness is better, but she presents with poor gaze stability during continued vestibular assessment today.  X1 exercises initiated and LE strengthening HEP given for the generalized deconditioning.  Pt is still very motivated to get better and stronger and caregiver wants to do what he can to help as well.  PT will continue to follow acutely and since pt did not qualify for CIR, she is appropriate for HHPT at discharge.  PT will continue to follow acutely.   Follow Up Recommendations  Supervision for mobility/OOB;Home health PT     Equipment Recommendations  Other (comment) (family interested in rollator )    Recommendations for Other Services   NA     Precautions / Restrictions Precautions Precautions: Fall Precaution Comments: postural dizziness    Mobility  Bed Mobility Overal bed mobility: Needs Assistance Bed Mobility: Supine to Sit;Sit to Supine Rolling: Modified independent (Device/Increase time)   Supine to sit: Min assist Sit to supine: Min assist   General bed mobility comments: Min assist to support trunk during transitions.  Pt relying heavily on her arms for support.   Transfers Overall transfer level: Needs assistance Equipment used: Rolling walker (2 wheeled) Transfers: Sit to/from Stand Sit to Stand: Min assist         General transfer comment: Min assist to support trunk during transitions.  Verbal cues for safe hand placement and cues to stay standing for ~30 seconds before starting to walk.   Ambulation/Gait Ambulation/Gait assistance: Min assist Ambulation Distance (Feet): 30 Feet (x2 with seated rest break) Assistive device: Rolling walker (2 wheeled) Gait Pattern/deviations: Step-through pattern;Antalgic;Shuffle;Trunk flexed Gait velocity: decreased Gait velocity interpretation: Below normal speed for age/gender General Gait Details: Shuffling and still mildly antalgic gait pattern (favoring her chronically sore left knee).  Verbal cues for upright posture, deep breathing during mobility.  Chair to follow for safety and to encourage increased gait distance (caregiver helped with the chair).         Modified Rankin (Stroke Patients Only) Modified Rankin (Stroke Patients Only) Pre-Morbid Rankin Score: Moderate disability Modified Rankin: Moderately severe disability     Balance Overall balance assessment: Needs assistance Sitting-balance support: Feet supported;Bilateral upper extremity supported;No upper extremity supported Sitting balance-Leahy Scale: Fair     Standing balance support: Bilateral upper extremity supported Standing balance-Leahy Scale: Poor Standing balance comment: needs external assist.                     Cognition Arousal/Alertness: Awake/alert Behavior During Therapy: WFL for tasks assessed/performed Overall Cognitive Status: Within Functional Limits for tasks assessed (caregiver translating)                      Exercises General Exercises - Upper Extremity Shoulder Flexion: AROM;Both;10 reps;Seated Elbow Flexion: AROM;Both;10 reps;Seated General Exercises - Lower Extremity Long Arc Quad: AROM;Both;10 reps;Seated  Hip ABduction/ADduction: AROM;Both;10 reps;Seated Hip Flexion/Marching: AROM;Both;10 reps;Seated Toe Raises: AROM;Both;10  reps;Seated Heel Raises: AROM;Both;10 reps;Seated Other Exercises Other Exercises: HEP given of seated leg exercises Other Exercises: x1 vertical and horizontal exercises given with target "A" due to poor gaze stability testing. Other Exercises:      General Comments General comments (skin integrity, edema, etc.): Further vestibular testing preformed.  Pt is no longer symptomatic for roll test today.  Reports significantly less symptoms (although not completely gone) after treatement yesterday.  The only thing lingering today is some gaze stability impairments with vertical and horizontal head shaking and tracking, so x1 vertical and horizontal exercises given.        Pertinent Vitals/Pain Pain Assessment: No/denies pain           PT Goals (current goals can now be found in the care plan section) Acute Rehab PT Goals Patient Stated Goal: Eager to work and get better Progress towards PT goals: Progressing toward goals    Frequency  Min 4X/week    PT Plan Discharge plan needs to be updated       End of Session   Activity Tolerance: Patient limited by fatigue Patient left: in chair;with call bell/phone within reach;with family/visitor present     Time: 1101-1147 PT Time Calculation (min): 46 min  Charges:  $Gait Training: 8-22 mins $Therapeutic Exercise: 8-22 mins $Therapeutic Activity: 8-22 mins                  Ahan Eisenberger B. Maria Gallicchio, PT, DPT [phone]   08/10/2014, 3:29 PM

## 2014-08-10 NOTE — Telephone Encounter (Signed)
Transitional Care Clinic:  Referral received from Tomi Bamberger regarding patient and possible need for Transitional Care Clinic follow-up after discharge. She indicated patient's son, Odis Hollingshead ([phone]), is the best contact for patient. Placed call to patient's son, [name], to discuss the Transitional Care Clinic. Patient's son indicates he was at work and about to teach a class. Patient's son requests return call tomorrow.  Transitional Care RN will need to follow-up with patient's son at a later time.

## 2014-08-10 NOTE — Discharge Summary (Addendum)
Physician Discharge Summary  [name] [name] WUJ:[id number] DOB: 02/29/1930 DOA: 08/04/2014  PCP: Gwynneth Aliment, MD  Admit date: 08/04/2014 Discharge date: 08/10/2014  Time spent: 45 minutes  Recommendations for Outpatient Follow-up:  F/u on partial seizures- started on Keppra Followup for resolution of headaches   Discharge Diagnoses:  Principal Problem:   Right sided weakness Active Problems:   DM2 (diabetes mellitus, type 2)   HTN (hypertension)   HLD (hyperlipidemia)   Hemiplegia, unspecified, affecting dominant side   Seizures   Discharge Condition: stable  Diet recommendation: heart healthy  Filed Weights   08/05/14 0447 08/07/14 0400 08/09/14 0443  Weight: 69.9 kg (154 lb 1.6 oz) 70.3 kg (154 lb 15.7 oz) 69.809 kg (153 lb 14.4 oz)    History of present illness:  This is an 80 year old female with a history of diabetes mellitus hypertension hyperlipidemia who was recently admitted for a left thalamic infarct with right arm and right leg weakness. She was discharged on Aggrenox and underwent physical therapy at home. Apparently she was doing much better and was able to walk with a walker however she suddenly became quite weak and at the same time developed a severe headache. She was admitted to the hospital for further workup. She was not a TPA candidate   Hospital Course:  Right sided weakness due to Seizure: - Per history obtained from family patient has been having episodes of right arm and shoulder jerking lasting about 30 seconds. She's had multiple spells.  - Per neurology, possible partial seizure since pt had thalamic stroke and according to descriptions by family  - Possible todd's paralysis. - Work up does not reveal a recurrent infarct- pt will need further PT who recommended SNF. - Change from Aggrenox to Plavix due to side effects. - MBS Pt's swallow function is WNL - she has an extremely sensitive cough response, which is triggered with the slightest  amount of penetration (material reaching superior underlying margin of epiglottis elicits a cough.) - EEG negative  - on primidone for ET  - keppra added for seizure prevention  - No  driving until seizure free for 6 months and under physician's care.  - d/c tramadol as can lower seizure threshold.  Headaches: -Her patient these usually start late morning and progress as the day goes on  -As mentioned above Aggrenox has been discontinued.   DM2 (diabetes mellitus, type 2): - has been overall controlled and Metformin was discontinued  -Sugars being checked regularly during the hospital stay and have been well controlled.  HLD (hyperlipidemia)  - Lipitor   Procedures:  EEG 9/13-- Rhis is a normal awake and asleep EEG. Please, be aware that a normal EEG does not exclude the possibility of epilepsy.   Consultations:  Neurology  Discharge Exam: Filed Vitals:   08/10/14 0755  BP: 123/52  Pulse: 66  Temp: 97.8 F (36.6 C)  Resp: 16    General: A&O x3 Cardiovascular: RRR Respiratory: good air movement CTA B/L  Discharge Instructions You were cared for by a hospitalist during your hospital stay. If you have any questions about your discharge medications or the care you received while you were in the hospital after you are discharged, you can call the unit and asked to speak with the hospitalist on call if the hospitalist that took care of you is not available. Once you are discharged, your primary care physician will handle any further medical issues. Please note that NO REFILLS for any discharge medications will be authorized  once you are discharged, as it is imperative that you return to your primary care physician (or establish a relationship with a primary care physician if you do not have one) for your aftercare needs so that they can reassess your need for medications and monitor your lab values.  Discharge Instructions   Diet - low sodium heart healthy    Complete by:  As  directed      Increase activity slowly    Complete by:  As directed           Current Discharge Medication List    START taking these medications   Details  clopidogrel (PLAVIX) 75 MG tablet Take 1 tablet (75 mg total) by mouth daily. Qty: 30 tablet, Refills: 0    levETIRAcetam (KEPPRA) 500 MG tablet Take 1 tablet (500 mg total) by mouth 2 (two) times daily. Qty: 60 tablet, Refills: 0      CONTINUE these medications which have CHANGED   Details  ALPRAZolam (XANAX) 0.25 MG tablet Take 1 tablet (0.25 mg total) by mouth daily as needed for anxiety. Qty: 30 tablet, Refills: 0      CONTINUE these medications which have NOT CHANGED   Details  atorvastatin (LIPITOR) 20 MG tablet Take 20 mg by mouth every evening.     docusate sodium (COLACE) 100 MG capsule Take 1 capsule (100 mg total) by mouth 2 (two) times daily. Qty: 10 capsule, Refills: 0    gabapentin (NEURONTIN) 100 MG capsule Take 3 capsules (300 mg total) by mouth 2 (two) times daily. Qty: 90 capsule, Refills: 1    polyethylene glycol (MIRALAX / GLYCOLAX) packet Take 17 g by mouth daily as needed for mild constipation.    primidone (MYSOLINE) 250 MG tablet Take 1 tablet (250 mg total) by mouth 2 (two) times daily. Qty: 180 tablet, Refills: 3   Associated Diagnoses: Essential tremor    Triamcinolone Acetonide (TRIAMCINOLONE 0.1 % CREAM : EUCERIN) CREA Apply 1 application topically 3 (three) times daily as needed for rash.      STOP taking these medications     dipyridamole-aspirin (AGGRENOX) 200-25 MG per 12 hr capsule      traMADol (ULTRAM) 50 MG tablet        Allergies  Allergen Reactions  . Tape Rash    Please do not use Plastic Tape   Follow-up Information   Follow up with Xu,Jindong, MD In 2 months. (stroke clinic)    Specialty:  Neurology   Contact information:   [address] [place] [address] [phone]        The results of significant diagnostics from this  hospitalization (including imaging, microbiology, ancillary and laboratory) are listed below for reference.    Significant Diagnostic Studies: Dg Chest 2 View  07/18/2014   CLINICAL DATA:  Dyspnea.  EXAM: CHEST  2 VIEW  COMPARISON:  March 17, 2013.  FINDINGS: Stable cardiomediastinal silhouette. No pneumothorax or pleural effusion is noted. Degenerative disc disease is noted in the lower thoracic spine. No acute pulmonary disease is noted.  IMPRESSION: No acute cardiopulmonary abnormality seen.   Electronically Signed   By: Roque Lias M.D.   On: 07/18/2014 14:10   Dg Hip Complete Left  07/20/2014   CLINICAL DATA:  Left leg pain  EXAM: LEFT HIP - COMPLETE 2+ VIEW  COMPARISON:  None.  FINDINGS: There is no evidence of hip fracture or dislocation. There is mild narrow bilateral hip joint spaces. Osteitis pubis is noted.  IMPRESSION:  No acute fracture or dislocation.   Electronically Signed   By: Sherian Rein M.D.   On: 07/20/2014 13:07   Dg Femur Left  07/20/2014   CLINICAL DATA:  Left leg pain, no known injury.  EXAM: LEFT FEMUR - 2 VIEW  COMPARISON:  None.  FINDINGS: There is no evidence of fracture or dislocation. Soft tissues are unremarkable.  IMPRESSION: No acute fracture or dislocation of the left femur.   Electronically Signed   By: Sherian Rein M.D.   On: 07/20/2014 13:08   Dg Tibia/fibula Left  07/20/2014   CLINICAL DATA:  Left leg pain, no known injury.  EXAM: LEFT TIBIA AND FIBULA - 2 VIEW  COMPARISON:  None.  FINDINGS: There is no evidence of fracture or dislocation. Soft tissues are unremarkable.  IMPRESSION: No acute fracture or dislocation.   Electronically Signed   By: Sherian Rein M.D.   On: 07/20/2014 13:09   Ct Head Wo Contrast  08/05/2014   CLINICAL DATA:  Altered mental status, decreased level of consciousness, recent stroke  EXAM: CT HEAD WITHOUT CONTRAST  TECHNIQUE: Contiguous axial images were obtained from the base of the skull through the vertex without intravenous  contrast.  COMPARISON:  MRI brain dated 07/19/2014  FINDINGS: No evidence of parenchymal hemorrhage or extra-axial fluid collection. No mass lesion, mass effect, or midline shift.  No CT evidence of acute infarction. Old left thalamic lacunar infarct.  Subcortical white matter and periventricular small vessel ischemic changes. Intracranial atherosclerosis.  Mild age related atrophy.  No ventriculomegaly.  Visualized paranasal sinuses are essentially clear. Mastoid air cells are unopacified.  No evidence of calvarial fracture.  IMPRESSION: No evidence of acute intracranial abnormality.  Old left thalamic lacunar infarct.  Mild atrophy with small vessel ischemic changes.   Electronically Signed   By: Charline Bills M.D.   On: 08/05/2014 00:47   Mr Brain Wo Contrast  08/05/2014   CLINICAL DATA:  Weakness on right side of body.  EXAM: MRI HEAD WITHOUT CONTRAST  TECHNIQUE: Multiplanar, multiecho pulse sequences of the brain and surrounding structures were obtained without intravenous contrast.  COMPARISON:  None.  FINDINGS: There is a in 1.1 cm linear focus of restricted diffusion within the lateral aspect of the left thalamus in region of previously identified stroke seen on 07/19/2014. Overall, the diffusion-weighted signal abnormality is less intense and decreased in size relative to prior study (most evident on coronal DWI sequence). Finding may reflect resolving ischemic infarct as diffusion-weighted signal abnormality can persist for up to 10 base (and longer). Possible re-infarction is not entirely excluded. No significant mass effect. Minimal petechial hemorrhage seen within this region on gradient echo sequence.  No other foci of abnormal restricted diffusion are identified. Gray-white matter differentiation maintained. Normal flow voids seen within the intracranial vasculature.  Few additional patchy T2/FLAIR hyperintensities again noted within the periventricular and supratentorial white matter,  unchanged. Cerebral volume within normal limits for patient age.  No mass lesion or midline shift. No hydrocephalus. There is no extra-axial fluid collection.  Calvarium and scalp soft tissues are unremarkable.  No acute abnormality seen about either orbit.  Paranasal sinuses and mastoid air cells are clear.  IMPRESSION: 1. Persistent 1.1 cm focus of linear restricted diffusion within the lateral left thalamus near the posterior limb of the left internal capsule. Finding is favored to reflect resolving infarct from the 07/19/2014 study given the relative decreased signal intensity and size from that prior exam, although possible acute re-infarction of this  area is not entirely excluded. 2. No other acute intracranial abnormality.   Electronically Signed   By: Rise Mu M.D.   On: 08/05/2014 23:00   Mr Brain Wo Contrast  07/19/2014   ADDENDUM REPORT: 07/19/2014 13:19  ADDENDUM: Study discussed by telephone with Dr. Onalee Hua Tat on 07/19/2014 at 1254 hrs.   Electronically Signed   By: Augusto Gamble M.D.   On: 07/19/2014 13:19   07/19/2014   CLINICAL DATA:  80 year old female with acute onset right side weakness facial droop and slurred speech. Code stroke. Improved symptoms, patient was not a candidate for tPA. Initial encounter.  EXAM: MRI HEAD WITHOUT CONTRAST  MRA HEAD WITHOUT CONTRAST  TECHNIQUE: Multiplanar, multiecho pulse sequences of the brain and surrounding structures were obtained without intravenous contrast. Angiographic images of the head were obtained using MRA technique without contrast.  COMPARISON:  Cervical spine MRI 03/16/2013.  FINDINGS: MRI HEAD FINDINGS  Oval 14 mm area of restricted diffusion in the lateral left thalamus abutting the posterior limb of the left internal capsule. T2 and FLAIR hyperintensity. No hemorrhage or mass effect. No other restricted diffusion identified. Major intracranial vascular flow voids are preserved. No midline shift, mass effect, evidence of mass lesion,  ventriculomegaly, extra-axial collection or acute intracranial hemorrhage. Cervicomedullary junction and pituitary are within normal limits. Negative for age visualized cervical spine. Outside of the acute findings, normal for age gray and white matter signal throughout the brain. Visible internal auditory structures appear normal. Mastoids are clear. Minor paranasal sinus mucosal thickening. Postoperative changes to the globes. Visualized scalp soft tissues are within normal limits. Normal bone marrow signal.  MRA HEAD FINDINGS  Antegrade flow in the posterior circulation with codominant distal vertebral arteries. Normal vertebrobasilar junction. Normal right PICA origin. Left AICA may be dominant. Mildly tortuous basilar artery, no stenosis. SCA and PCA origins are within normal limits.  The left PCAs occluded in the P2 segment, with no distal left PCA flow (series 605, image 18). Posterior communicating arteries are diminutive or absent. Right PCA branches are within normal limits.  Antegrade flow in both ICA siphons. Mild tortuosity but no siphon stenosis identified. Ophthalmic artery origins are within normal limits. Carotid termini are patent. MCA and ACA origins are within normal limits. Anterior communicating artery is normal. Median artery of the corpus callosum is present. Visualized bilateral ACA branches are within normal limits. Visualized bilateral MCA branches are within normal limits.  IMPRESSION: 1. Acute lacunar infarct (14 mm) in the lateral left thalamus near the posterior limb of the left internal capsule. No mass effect or hemorrhage. 2. The left PCA is occluded in the P2 segment, near the expected location of the left thalamostriate artery origins. 3. Otherwise negative intracranial MRA, and otherwise normal for age non contrast MRI appearance of the brain.  Electronically Signed: By: Augusto Gamble M.D. On: 07/19/2014 12:22   Dg Foot Complete Left  07/20/2014   CLINICAL DATA:  Left leg pain   EXAM: LEFT FOOT - COMPLETE 3+ VIEW  COMPARISON:  None.  FINDINGS: There is no evidence of fracture or dislocation. There is calcification at the insertion of Achilles tendon to the calcaneus. Soft tissues are unremarkable.  IMPRESSION: No acute fracture or dislocation.   Electronically Signed   By: Sherian Rein M.D.   On: 07/20/2014 13:11   Mr Maxine Glenn Head/brain Wo Cm  07/19/2014   ADDENDUM REPORT: 07/19/2014 13:19  ADDENDUM: Study discussed by telephone with Dr. Onalee Hua Tat on 07/19/2014 at 1254 hrs.  Electronically Signed   By: Augusto Gamble M.D.   On: 07/19/2014 13:19   07/19/2014   CLINICAL DATA:  80 year old female with acute onset right side weakness facial droop and slurred speech. Code stroke. Improved symptoms, patient was not a candidate for tPA. Initial encounter.  EXAM: MRI HEAD WITHOUT CONTRAST  MRA HEAD WITHOUT CONTRAST  TECHNIQUE: Multiplanar, multiecho pulse sequences of the brain and surrounding structures were obtained without intravenous contrast. Angiographic images of the head were obtained using MRA technique without contrast.  COMPARISON:  Cervical spine MRI 03/16/2013.  FINDINGS: MRI HEAD FINDINGS  Oval 14 mm area of restricted diffusion in the lateral left thalamus abutting the posterior limb of the left internal capsule. T2 and FLAIR hyperintensity. No hemorrhage or mass effect. No other restricted diffusion identified. Major intracranial vascular flow voids are preserved. No midline shift, mass effect, evidence of mass lesion, ventriculomegaly, extra-axial collection or acute intracranial hemorrhage. Cervicomedullary junction and pituitary are within normal limits. Negative for age visualized cervical spine. Outside of the acute findings, normal for age gray and white matter signal throughout the brain. Visible internal auditory structures appear normal. Mastoids are clear. Minor paranasal sinus mucosal thickening. Postoperative changes to the globes. Visualized scalp soft tissues are within  normal limits. Normal bone marrow signal.  MRA HEAD FINDINGS  Antegrade flow in the posterior circulation with codominant distal vertebral arteries. Normal vertebrobasilar junction. Normal right PICA origin. Left AICA may be dominant. Mildly tortuous basilar artery, no stenosis. SCA and PCA origins are within normal limits.  The left PCAs occluded in the P2 segment, with no distal left PCA flow (series 605, image 18). Posterior communicating arteries are diminutive or absent. Right PCA branches are within normal limits.  Antegrade flow in both ICA siphons. Mild tortuosity but no siphon stenosis identified. Ophthalmic artery origins are within normal limits. Carotid termini are patent. MCA and ACA origins are within normal limits. Anterior communicating artery is normal. Median artery of the corpus callosum is present. Visualized bilateral ACA branches are within normal limits. Visualized bilateral MCA branches are within normal limits.  IMPRESSION: 1. Acute lacunar infarct (14 mm) in the lateral left thalamus near the posterior limb of the left internal capsule. No mass effect or hemorrhage. 2. The left PCA is occluded in the P2 segment, near the expected location of the left thalamostriate artery origins. 3. Otherwise negative intracranial MRA, and otherwise normal for age non contrast MRI appearance of the brain.  Electronically Signed: By: Augusto Gamble M.D. On: 07/19/2014 12:22    Microbiology: No results found for this or any previous visit (from the past 240 hour(s)).   Labs: Basic Metabolic Panel:  Recent Labs Lab 08/05/14 0020  NA 139  K 4.6  CL 101  CO2 26  GLUCOSE 94  BUN 21  CREATININE 0.77  CALCIUM 8.8   Liver Function Tests:  Recent Labs Lab 08/05/14 0020  AST 29  ALT 35  ALKPHOS 65  BILITOT <0.2*  PROT 6.8  ALBUMIN 3.3*   No results found for this basename: LIPASE, AMYLASE,  in the last 168 hours No results found for this basename: AMMONIA,  in the last 168  hours CBC:  Recent Labs Lab 08/05/14 0020  WBC 6.0  NEUTROABS 2.6  HGB 14.2  HCT 42.6  MCV 91.8  PLT 145*   Cardiac Enzymes: No results found for this basename: CKTOTAL, CKMB, CKMBINDEX, TROPONINI,  in the last 168 hours BNP: BNP (last 3 results) No results found for  this basename: PROBNP,  in the last 8760 hours CBG:  Recent Labs Lab 08/09/14 0745 08/09/14 1120 08/09/14 1659 08/09/14 2133 08/10/14 0746  GLUCAP 101* 86 92 123* 105*       Signed:  FELIZ ORTIZ, ABRAHAM  [facility] 08/10/2014, 9:04 AM

## 2014-08-10 NOTE — Evaluation (Signed)
Clinical/Bedside Swallow Evaluation Patient Details  Name: [name] [name] MRN: [id number] Date of Birth: 31-May-1930  Today's Date: 08/10/2014 Time: 8295-6213 SLP Time Calculation (min): 20 min  Past Medical History:  Past Medical History  Diagnosis Date  . Hypertension   . High cholesterol   . DVT (deep venous thrombosis)   . Pneumonia   . Anxiety   . Essential tremor 10/27/2013  . Obesity, unspecified 10/27/2013  . Newly diagnosed diabetes 10/27/2013  . Unspecified hereditary and idiopathic peripheral neuropathy 10/27/2013   Past Surgical History:  Past Surgical History  Procedure Laterality Date  . Foot surgery    . Cataract extraction Bilateral    HPI:  80 y.o. female admitted on 9/10 with increasing weakness, headache.  Recent admission on 07/18/14 with R sided weakness, facial droop. She was transported from [place] to [facility] per pt and family request. MRI revealed acute lacunar infarct (14 mm) in the lateral left thalamus. Pt went to CIR then home with acute increased weakness and shaking on 9/10 with readmission, MRI without further acute events, sz. Pt with significant PMHx of HTN, DVT, anxiety, essential tremor, DM, peripheral neuropathy, and foot surgery.    Pt with intermittent coughing associated with liquids - apparently this has been occurring since initial CVA per son.     Assessment / Plan / Recommendation Clinical Impression  Pt presents with s/s of dysphagia with minimal deficits PTA but now exacerbated.  Oral motor function is WNL.  Thin and nectar-thick liquids elicit an immediate and consistent cough response, not alleviated by chin tuck or head turn at bedside.   Pt reports, with translation assistance from her son, that she coughs daily with meals, but that this has worsened with new admission.  Given pt's D/C being postponed at least until tomorrow, recommend proceeding with MBS next date in am to determine safest diet and appropriate intervention.        Aspiration Risk  Mild    Diet Recommendation Regular;Thin liquid - continue this diet given no deterioration in lung status while hospitalized.     Liquid Administration via: Cup Medication Administration: Whole meds with puree Supervision: Patient able to self feed Compensations: Slow rate;Small sips/bites Postural Changes and/or Swallow Maneuvers: Seated upright 90 degrees    Other  Recommendations Recommended Consults: MBS Oral Care Recommendations: Oral care BID   Follow Up Recommendations   (tba)    Frequency and Duration        Pertinent Vitals/Pain No pain    SLP Swallow Goals  pending mbs   Swallow Study Prior Functional Status        General Date of Onset: 08/04/14 HPI: 80 y.o. female admitted on 9/10 with increasing weakness, headache.  Recent admission on 07/18/14 with R sided weakness, facial droop. She was transported from [place] to [facility] per pt and family request. MRI revealed, "Acute lacunar infarct (14 mm) in the lateral left thalamus. Pt went to CIR then home with acute increased weakness and shaking on 9/10 with readmission, MRI without further acute events, sz. Pt with significant PMHx of HTN, DVT, anxiety, essential tremor, DM, peripheral neuropathy, and foot surgery.    Pt with intermittent coughing associated with liquids - apparently this has been occurring since initial CVA per son.   Type of Study: Bedside swallow evaluation Diet Prior to this Study: Regular;Thin liquids Temperature Spikes Noted: No Respiratory Status: Room air Behavior/Cognition: Alert;Cooperative;Pleasant mood Oral Cavity - Dentition: Dentures, top;Dentures, bottom Self-Feeding Abilities: Able to feed self  Patient Positioning: Upright in bed Baseline Vocal Quality: Clear Volitional Cough: Strong Volitional Swallow: Able to elicit    Oral/Motor/Sensory Function Overall Oral Motor/Sensory Function: Appears within functional limits for tasks assessed   Ice Chips Ice chips:  Not tested   Thin Liquid Thin Liquid: Impaired Presentation: Cup;Self Fed Pharyngeal  Phase Impairments: Cough - Immediate    Nectar Thick Nectar Thick Liquid: Impaired Presentation: Cup;Self Fed Pharyngeal Phase Impairments: Cough - Immediate   Honey Thick Honey Thick Liquid: Not tested   Puree Puree: Within functional limits Presentation: Self Fed;Spoon   Solid   GO    Solid: Within functional limits Presentation: Self Fed      Mario Voong L. Samson [place], [place] CCC/SLP Pager [phone]  [name] 08/10/2014,5:13 PM

## 2014-08-11 ENCOUNTER — Inpatient Hospital Stay (HOSPITAL_COMMUNITY): Payer: Medicare Other

## 2014-08-11 DIAGNOSIS — D696 Thrombocytopenia, unspecified: Secondary | ICD-10-CM

## 2014-08-11 LAB — GLUCOSE, CAPILLARY
Glucose-Capillary: 103 mg/dL — ABNORMAL HIGH (ref 70–99)
Glucose-Capillary: 103 mg/dL — ABNORMAL HIGH (ref 70–99)

## 2014-08-11 NOTE — Progress Notes (Addendum)
[facility] PROGRESS NOTE   Assessment/Plan:  Right sided weakness:  - Family would like to take her home, they can provide 24 hr care. - Change from Aggrenox to Plavix due to side effects.  - MBS Pt's swallow function is WNL - she has an extremely sensitive cough response, which is triggered with the slightest amount of penetration (material reaching superior underlying margin of epiglottis elicits a cough.)   Seizure:  -Per neurology, possible partial seizure since pt had thalamic stroke and according to descriptions by family  - EEG negative  - on primidone for ET  - keppra added for seizure prevention  - No driving until seizure free for 6 months and under physician's care.   Headaches:  -Her patient these usually start late morning and progress as the day goes on   DM2 (diabetes mellitus, type 2):  - has been overall controlled and Metformin was discontinued  -Sugars being checked regularly during the hospital stay and have been well controlled.   HLD (hyperlipidemia)  - Lipitor   Consultants:  neuro  HPI/Subjective: No compalins  Objective: Filed Vitals:   08/10/14 1730 08/10/14 2024 08/11/14 0010 08/11/14 0431  BP: 125/51 118/53 124/60 103/58  Pulse: 71 73 74 68  Temp: 98 F (36.7 C) 97.9 F (36.6 C) 98.3 F (36.8 C) 98 F (36.7 C)  TempSrc: Axillary Oral Oral Oral  Resp: Height:      Weight:      SpO2: 97% 98% 97% 97%    Intake/Output Summary (Last 24 hours) at 08/11/14 0826 Last data filed at 08/11/14 0500  Gross per 24 hour  Intake    240 ml  Output    900 ml  Net   -660 ml   Filed Weights   08/05/14 0447 08/07/14 0400 08/09/14 0443  Weight: 69.9 kg (154 lb 1.6 oz) 70.3 kg (154 lb 15.7 oz) 69.809 kg (153 lb 14.4 oz)    Exam:  General: Alert, awake, oriented x3, in no acute distress.   Data Reviewed: Basic Metabolic Panel:  Recent Labs Lab 08/05/14 0020  NA 139  K 4.6  CL 101  CO2 26  GLUCOSE 94  BUN 21    CREATININE 0.77  CALCIUM 8.8   Liver Function Tests:  Recent Labs Lab 08/05/14 0020  AST 29  ALT 35  ALKPHOS 65  BILITOT <0.2*  PROT 6.8  ALBUMIN 3.3*   No results found for this basename: LIPASE, AMYLASE,  in the last 168 hours No results found for this basename: AMMONIA,  in the last 168 hours CBC:  Recent Labs Lab 08/05/14 0020  WBC 6.0  NEUTROABS 2.6  HGB 14.2  HCT 42.6  MCV 91.8  PLT 145*   Cardiac Enzymes: No results found for this basename: CKTOTAL, CKMB, CKMBINDEX, TROPONINI,  in the last 168 hours BNP (last 3 results) No results found for this basename: PROBNP,  in the last 8760 hours CBG:  Recent Labs Lab 08/10/14 0746 08/10/14 1155 08/10/14 1653 08/10/14 2124 08/11/14 0739  GLUCAP 105* 99 117* 251* 103*    No results found for this or any previous visit (from the past 240 hour(s)).   Studies: No results found.  Scheduled Meds: .  stroke: mapping our early stages of recovery book   Does not apply Once  . atorvastatin  20 mg Oral QPM  . clopidogrel  75 mg Oral Daily  . docusate sodium  100 mg Oral BID  .  heparin  5,000 Units Subcutaneous 3 times per day  . levETIRAcetam  500 mg Oral BID  . naphazoline  1 drop Both Eyes BID  . primidone  250 mg Oral BID   Continuous Infusions:    Marinda Elk  [facility] Pager [phone]. If 8PM-8AM, please contact night-coverage at [email or web address], password [facility] 08/11/2014, 8:26 AM  LOS: 7 days

## 2014-08-11 NOTE — Progress Notes (Signed)
TRANSITIONAL CARE CLINIC NOTE:  [facility] Coordinator attempted to contact patient's [name]) [name] [name] (ph# [phone]) LVM mail for a return call. Also,attempted to contacted patient's daughter [name] [name] at [phone] no answer. [facility] Coordinator will make 2nd attempt.

## 2014-08-11 NOTE — Plan of Care (Signed)
Problem: Discharge/Transitional Outcomes Goal: Barriers To Progression Addressed/Resolved Outcome: Completed/Met Date Met:  08/11/14 Started on Keppra for partial seizure like activity tremors Goal: Educational Plan Complete Outcome: Completed/Met Date Met:  08/11/14 Stroke book given Goal: Independent mobility/functioning independent or with min Independent mobility/functioning independently or with minimal assistance  Outcome: Adequate for Discharge Requires standby assist will go home with son Goal: Family/Caregiver willing and able to support plan Family/Caregiver willing and able to support plan for self-management after transition home  Outcome: Completed/Met Date Met:  08/11/14 Will also receive [facility] care

## 2014-08-11 NOTE — Progress Notes (Signed)
PT Cancellation Note  Patient Details Name: [name] [name] MRN: [id number] DOB: 05/27/1930   Cancelled Treatment:    Reason Eval/Treat Not Completed: Patient at procedure or test/unavailable.  Pt is leaving her room with transporter to go to swallow assessment.  PT will check back later as time allows.   Thanks,    Rollene Rotunda. Chantea Surace, PT, DPT [phone]   08/11/2014, 9:34 AM

## 2014-08-11 NOTE — Procedures (Signed)
Objective Swallowing Evaluation: Modified Barium Swallowing Study  Patient Details  Name: [name] [name] MRN: [id number] Date of Birth: [birth year]  Today's Date: 08/11/2014 Time: 1000-1030 SLP Time Calculation (min): 30 min  Past Medical History:  Past Medical History  Diagnosis Date  . Hypertension   . High cholesterol   . DVT (deep venous thrombosis)   . Pneumonia   . Anxiety   . Essential tremor 10/27/2013  . Obesity, unspecified 10/27/2013  . Newly diagnosed diabetes 10/27/2013  . Unspecified hereditary and idiopathic peripheral neuropathy 10/27/2013   Past Surgical History:  Past Surgical History  Procedure Laterality Date  . Foot surgery    . Cataract extraction Bilateral    HPI:  80 y.o. female admitted on 9/10 with increasing weakness, headache.  Recent admission on 07/18/14 with R sided weakness, facial droop. She was transported from [place] to [facility] per pt and family request. MRI revealed, "Acute lacunar infarct (14 mm) in the lateral left thalamus. Pt went to CIR then home with acute increased weakness and shaking on 9/10 with readmission, MRI without further acute events, sz. Pt with significant PMHx of HTN, DVT, anxiety, essential tremor, DM, peripheral neuropathy, and foot surgery.    Pt with intermittent coughing associated with liquids - apparently this has been occurring since initial CVA per son.       Assessment / Plan / Recommendation Clinical Impression  Dysphagia Diagnosis: Within Functional Limits Clinical impression: Pt's swallow function is WNL - she has an extremely sensitive cough response, which is triggered with the slightest amount of penetration (material reaching superior underlying margin of epiglottis elicits a cough.)  There was no aspiration nor penetration of material to the vocal folds.  Swallow response was timely and pharyngeal peristalsis was adequate.  Pt/son watched video, discussed results.  No f/u warranted.      Treatment  Recommendation  No treatment recommended at this time    Diet Recommendation Regular;Thin liquid   Liquid Administration via: Straw;Cup Medication Administration: Whole meds with puree Supervision: Patient able to self feed Postural Changes and/or Swallow Maneuvers: Seated upright 90 degrees    Other  Recommendations Oral Care Recommendations: Oral care BID   Follow Up Recommendations  None           SLP Swallow Goals     General Date of Onset: 08/04/14 HPI: 80 y.o. female admitted on 9/10 with increasing weakness, headache.  Recent admission on 07/18/14 with R sided weakness, facial droop. She was transported from [place] to [facility] per pt and family request. MRI revealed, "Acute lacunar infarct (14 mm) in the lateral left thalamus. Pt went to CIR then home with acute increased weakness and shaking on 9/10 with readmission, MRI without further acute events, sz. Pt with significant PMHx of HTN, DVT, anxiety, essential tremor, DM, peripheral neuropathy, and foot surgery.    Pt with intermittent coughing associated with liquids - apparently this has been occurring since initial CVA per son.   Type of Study: Modified Barium Swallowing Study Reason for Referral: Objectively evaluate swallowing function Previous Swallow Assessment: clinical swallow eval Diet Prior to this Study: Regular;Thin liquids Temperature Spikes Noted: No Respiratory Status: Room air History of Recent Intubation: No Behavior/Cognition: Alert;Cooperative;Pleasant mood Oral Cavity - Dentition: Dentures, top;Dentures, bottom Oral Motor / Sensory Function: Within functional limits Self-Feeding Abilities: Able to feed self Patient Positioning: Upright in chair Baseline Vocal Quality: Clear Volitional Cough: Strong Volitional Swallow: Able to elicit Anatomy: Within functional limits Pharyngeal Secretions: Not observed  secondary MBS    Reason for Referral Objectively evaluate swallowing function   Oral Phase Oral  Preparation/Oral Phase Oral Phase: WFL   Pharyngeal Phase Pharyngeal Phase Pharyngeal Phase: Within functional limits  Cervical Esophageal Phase    [name] [name] L. [place], [place] CCC/SLP Pager [phone]    Cervical Esophageal Phase Cervical Esophageal Phase: [name] [name]         [name] [name] [name] [name] 08/11/2014, 11:02 AM

## 2014-08-11 NOTE — Progress Notes (Signed)
Physical Therapy Treatment Patient Details Name: [name] [name] MRN: [id number] DOB: 1930/09/29 Today's Date: 08/11/2014    History of Present Illness 80 y.o. female admitted to [facility] on 07/18/14 with R sided weakness, facial droop.  She was transported from [place] to [facility] per pt and family request. MRI revealed, "Acute lacunar infarct (14 mm) in the lateral left thalamus. Pt went to CIR then home with acute increased weakness and shaking on 9/10 with readmission, MRI without further acute events, sz.  Pt with significant PMHx of HTN, DVT, anxiety, essential tremor, DM, peripheral neuropathy, and foot surgery.     PT Comments    Pt is slowly progressing with gait and mobility.  She was able to preform a whole set of all of her HEP exercises and walk without a chair to follow with assistance today.  She still has a mild sense of dizziness especially when trying to complete x 1 seated exercises both vertically and horizontally.  PT will continue to follow acutely.    Follow Up Recommendations  Home health PT;Supervision for mobility/OOB     Equipment Recommendations  Other (comment) (family interested in a rollator)    Recommendations for Other Services   NA     Precautions / Restrictions Precautions Precautions: Fall Precaution Comments: postural dizziness Restrictions Weight Bearing Restrictions: No    Mobility   Transfers Overall transfer level: Needs assistance Equipment used: Rolling walker (2 wheeled) Transfers: Sit to/from Stand Sit to Stand: Min assist         General transfer comment: Min assist to support trunk during transitions.  Pt is slow to transition over weak legs and needs verbal and manual assist for safe hand placement.   Ambulation/Gait Ambulation/Gait assistance: Min assist Ambulation Distance (Feet): 75 Feet Assistive device: Rolling walker (2 wheeled) Gait Pattern/deviations: Step-through pattern;Antalgic;Trunk flexed Gait velocity:  decreased Gait velocity interpretation: Below normal speed for age/gender General Gait Details: Pt continues to have a shuffling, mildly antalgic gait pattern, verbal and tactile cues for upright posture.  No chair to follow today and pt was able to power through and walk all the way back to her room.        Modified Rankin (Stroke Patients Only) Modified Rankin (Stroke Patients Only) Pre-Morbid Rankin Score: Moderate disability Modified Rankin: Moderately severe disability     Balance Overall balance assessment: Needs assistance Sitting-balance support: Feet supported;No upper extremity supported Sitting balance-Leahy Scale: Good     Standing balance support: Bilateral upper extremity supported Standing balance-Leahy Scale: Poor                      Cognition Arousal/Alertness: Awake/alert Behavior During Therapy: WFL for tasks assessed/performed Overall Cognitive Status: Within Functional Limits for tasks assessed (no translator today)                      Exercises General Exercises - Upper Extremity Shoulder Flexion: AROM;Both;10 reps;Seated Elbow Flexion: AROM;Both;10 reps;Seated General Exercises - Lower Extremity Long Arc Quad: AROM;Both;10 reps;Seated Hip ABduction/ADduction: AROM;Both;10 reps;Seated Hip Flexion/Marching: AROM;Both;10 reps;Seated Toe Raises: AROM;Both;10 reps;Seated Heel Raises: AROM;Both;10 reps;Seated Other Exercises Other Exercises: reviewed x1 vertical and horizontal exercises.  Pt moving slowly and with much effort to preform these exercises.          Pertinent Vitals/Pain Pain Assessment: No/denies pain           PT Goals (current goals can now be found in the care plan section) Acute Rehab PT Goals  Patient Stated Goal: Eager to work and get better Progress towards PT goals: Progressing toward goals    Frequency  Min 4X/week    PT Plan Current plan remains appropriate       End of Session   Activity  Tolerance: Patient limited by fatigue Patient left: in chair;with call bell/phone within reach;with family/visitor present     Time: 6578-4696 PT Time Calculation (min): 17 min  Charges:  $Gait Training: 8-22 mins                      Seferino Oscar B. Sadira Standard, PT, DPT [phone]   08/11/2014, 11:30 AM

## 2014-08-15 ENCOUNTER — Telehealth: Payer: Self-pay | Admitting: Neurology

## 2014-08-15 NOTE — Telephone Encounter (Signed)
Patient's son calling to schedule an appointment for patient, says that she is out of the hospital now. Please call and schedule.

## 2014-08-16 NOTE — Telephone Encounter (Signed)
Spoke with son to confirm appt for patient for 08/19/14

## 2014-08-16 NOTE — Telephone Encounter (Signed)
Spoke with granddaughter , scheduled appt, for patient

## 2014-08-16 NOTE — Telephone Encounter (Signed)
Called to confirm appt for 08/19/14, at 10:30,lt VM message

## 2014-08-18 DIAGNOSIS — G909 Disorder of the autonomic nervous system, unspecified: Secondary | ICD-10-CM

## 2014-08-18 DIAGNOSIS — E1149 Type 2 diabetes mellitus with other diabetic neurological complication: Secondary | ICD-10-CM

## 2014-08-18 DIAGNOSIS — I69959 Hemiplegia and hemiparesis following unspecified cerebrovascular disease affecting unspecified side: Secondary | ICD-10-CM

## 2014-08-18 DIAGNOSIS — I10 Essential (primary) hypertension: Secondary | ICD-10-CM

## 2014-08-18 DIAGNOSIS — F411 Generalized anxiety disorder: Secondary | ICD-10-CM

## 2014-08-19 ENCOUNTER — Encounter: Payer: Self-pay | Admitting: Neurology

## 2014-08-19 ENCOUNTER — Ambulatory Visit (INDEPENDENT_AMBULATORY_CARE_PROVIDER_SITE_OTHER): Payer: Medicare Other | Admitting: Neurology

## 2014-08-19 VITALS — BP 126/77 | HR 81 | Temp 97.9°F | Ht <= 58 in | Wt 143.0 lb

## 2014-08-19 DIAGNOSIS — E669 Obesity, unspecified: Secondary | ICD-10-CM

## 2014-08-19 DIAGNOSIS — G252 Other specified forms of tremor: Secondary | ICD-10-CM

## 2014-08-19 DIAGNOSIS — G40109 Localization-related (focal) (partial) symptomatic epilepsy and epileptic syndromes with simple partial seizures, not intractable, without status epilepticus: Secondary | ICD-10-CM

## 2014-08-19 DIAGNOSIS — I639 Cerebral infarction, unspecified: Secondary | ICD-10-CM

## 2014-08-19 DIAGNOSIS — I6350 Cerebral infarction due to unspecified occlusion or stenosis of unspecified cerebral artery: Secondary | ICD-10-CM

## 2014-08-19 DIAGNOSIS — G250 Essential tremor: Secondary | ICD-10-CM

## 2014-08-19 DIAGNOSIS — G609 Hereditary and idiopathic neuropathy, unspecified: Secondary | ICD-10-CM

## 2014-08-19 MED ORDER — LEVETIRACETAM 500 MG PO TABS: 500.0000 mg | ORAL_TABLET | Freq: Two times a day (BID) | ORAL | Status: DC

## 2014-08-19 MED ORDER — CLOPIDOGREL BISULFATE 75 MG PO TABS: 75.0000 mg | ORAL_TABLET | Freq: Every day | ORAL | Status: DC

## 2014-08-19 NOTE — Progress Notes (Signed)
Subjective:    Patient ID: [name] [name] is a 80 y.o. female.  HPI    Interim history:  [name] [name] is a very pleasant 80 year old [place] lady with an underlying medical history of hypertension, hyperlipidemia, DVT, pneumonia, anxiety, obesity, diabetes, peripheral neuropathy, and essential tremor, who presents for a new problem after a recent hospitalization for new stroke. She is accompanied by by her son, her daughter and an interpreter today. I last saw her on 10/27/2013 at which time she presented for followup of her long-standing history of essential tremor. I felt she was stable in that regard and felt that her diabetes had resulted in diabetic neuropathy. I asked her to continue with Mysoline at the same strength and increased her gabapentin to 3 pills at night for a total of 300 mg each bedtime.  In the interim she presented to the emergency room on 07/18/2014 with new onset right-sided weakness, facial droop, slurring of speech which started around 6 PM the night before. She was in [place], [place] visiting family when she had acute onset of the symptoms. She was seen in [place] at the hospital and CT head was negative for bleed but she did not receive TPA because of low platelet count of 90,000. She had improvement of her symptoms but persistent deficits. She was admitted to the hospital and had a complete stroke workup. She had an MRI of the brain as well as MRA head and was found to have a left thalamic ischemic stroke. Neurology was consulted. MRA showed left PCA occlusion. Echocardiogram showed an EF of 60-65% with no wall motion abnormalities and no source of emboli. Carotid Doppler studies were negative for any significant ICA stenosis (1-39%). Hb A1c was 6. LDL was 62. She was placed on aspirin for secondary stroke prevention and then changed to Aggrenox. Blood work showed normal B12 levels, negative hepatitis serology, chest x-ray was negative. EKG did not show any  ST abnormalities. She had some left leg pain. Lower extremity Doppler studies from 07/20/2014 were negative for DVT. She was discharged on 07/21/2014 to inpatient rehabilitation. She was discharged from rehabilitation on 08/02/2014. I reviewed hospital records as well as imaging tests. Her daughter called and asked for a sooner than scheduled appointment because of worsening weakness. However, she missed an appointment on 08/05/2014 because she was readmitted to the hospital on 08/04/2014 for slurring of speech and shaking spells. I reviewed the hospital records. She had an EEG, which was negative for seizure activity but she was felt to have partial onset seizures and was started on Keppra which is currently at 500 mg twice daily. She was changed from Aggrenox to Plavix 75 mg.  She had a brain MRI without contrast on 08/05/2014: 1. Persistent 1.1 cm focus of linear restricted diffusion within the lateral left thalamus near the posterior limb of the left internal capsule. Finding is favored to reflect resolving infarct from the 07/19/2014 study given the relative decreased signal intensity and size from that prior exam, although possible acute re-infarction of this area is not entirely excluded. 2. No other acute intracranial abnormality. In addition, I personally reviewed the images through the PACS system. She had an EEG on 08/06/2014: This was reported as normal in the asleep and awake states. Her son called on 08/15/2014 to make an appointment.  Today, her son reports, that Aggrenox was causing her HAs and these improved with switching to plavix. She snores mildly, but has no pauses. She has been feeling dizzy  in the morning. She has been eating ok. She is not choking on food. She has a 2 wheeled walker which she uses with assistance. They have not yet started outpatient therapy. Her insurance would pay for home health therapy but they are requesting a referral for outpatient therapy locally in [facility]. I also spoke on the phone with the patient's granddaughter who is in [place] and a resident physician. I answered all her questions she had for many. The patient has not had any episodic twitching since she was placed on Keppra. The patient reports that she is doing fairly well. She still has tremors affecting her upper extremities and her head. She has been able to tolerate Keppra. She has not yet seen her primary care physician since the last hospital discharge. She does have an appointment next month. In the hospital she was taken off of metformin and atenolol as well as another blood pressure medication. They increased her gabapentin and she feels heaviness in her head when she first wakes up. She has not fallen.  I first met her on 01/19/2013, at which time I increased her Mysoline 250 mg strength 1-1/2 pills to 2 pills daily. I did advise her of the potential increase risk of side effects. She previously followed with Dr. Morene Antu. She has a billateral UE and head tremor for over 25 years. She was tried on primidone while she was in [place] with improvement in tremor. It was discontinued when she came to the [place] because she was on Coumadin. Her tremor affects her ability to feed herself, write, and put on her makeup and is worse with anxiety. Her daughter has a tremor as well. She was restarted on primidone by Dr. Erling Cruz in 3/12. She is no longer on coumadin. She was on coumadin for a history of DVT in her left leg following left foot injury. She has had numbness in her legs without pain. EMG/NCV 02/27/11 was normal except for some denervation in the left lumbosacral paraspinal muscles. B12 level was normal. She is on gabapentin 100 mg 2 at night with relief of symptoms. Her major complaint is that of ongoing tremor, which is worse in the morning. She has not had side effects such as grogginess or difficulty walking with the primidone. She no longer cooks. Her tremor involves her writing  and abilities to feed herself. She also continues to take gabapentin 114m 2 qHS for her neuropathy. She has not fallen per son. She was supposed to come back in 3 months but missed several appointments. She has been diagnosed with DM and has been started on metforim. She feels, her numbness and tingling is worse in her feet and she has now has started noticing burning sensation in her feet.   Her Past Medical History Is Significant For: Past Medical History  Diagnosis Date  . Hypertension   . High cholesterol   . DVT (deep venous thrombosis)   . Pneumonia   . Anxiety   . Essential tremor 10/27/2013  . Obesity, unspecified 10/27/2013  . Newly diagnosed diabetes 10/27/2013  . Unspecified hereditary and idiopathic peripheral neuropathy 10/27/2013    Her Past Surgical History Is Significant For: Past Surgical History  Procedure Laterality Date  . Foot surgery    . Cataract extraction Bilateral     Her Family History Is Significant For: Family History  Problem Relation Age of Onset  . Coronary artery disease Other   . Stroke Mother   . Heart Problems  Sister   . Heart Problems Brother     Her Social History Is Significant For: History   Social History  . Marital Status: Widowed    Spouse Name: N/A    Number of Children: 65  . Years of Education: 9   Occupational History  .      does not work   Social History Main Topics  . Smoking status: Never Smoker   . Smokeless tobacco: Never Used  . Alcohol Use: No  . Drug Use: No  . Sexual Activity: No   Other Topics Concern  . None   Social History Narrative   Patient is right handed and resides with son    Her Allergies Are:  Allergies  Allergen Reactions  . Tape Rash    Please do not use Plastic Tape  :   Her Current Medications Are:  Outpatient Encounter Prescriptions as of 08/19/2014  Medication Sig  . ALPRAZolam (XANAX) 0.25 MG tablet Take 1 tablet (0.25 mg total) by mouth daily as needed for anxiety.  [name]  atorvastatin (LIPITOR) 20 MG tablet Take 20 mg by mouth every evening.   . clopidogrel (PLAVIX) 75 MG tablet Take 1 tablet (75 mg total) by mouth daily.  [name] docusate sodium (COLACE) 100 MG capsule Take 1 capsule (100 mg total) by mouth 2 (two) times daily.  [name] gabapentin (NEURONTIN) 100 MG capsule Take 3 capsules (300 mg total) by mouth 2 (two) times daily.  [name] levETIRAcetam (KEPPRA) 500 MG tablet Take 1 tablet (500 mg total) by mouth 2 (two) times daily.  . polyethylene glycol (MIRALAX / GLYCOLAX) packet Take 17 g by mouth daily as needed for mild constipation.  . primidone (MYSOLINE) 250 MG tablet Take 1 tablet (250 mg total) by mouth 2 (two) times daily.  . Triamcinolone Acetonide (TRIAMCINOLONE 0.1 % CREAM : EUCERIN) CREA Apply 1 application topically 3 (three) times daily as needed for rash.  :  Review of Systems:  Out of a complete 14 point review of systems, all are reviewed and negative with the exception of these symptoms as listed below:   Review of Systems  Neurological:       Heavy right leg all day, heavy head in mornings    Objective:  Neurologic Exam  Physical Exam Physical Examination:   Filed Vitals:   08/19/14 1036  BP: 126/77  Pulse: 81  Temp: 97.9 F (36.6 C)   General Examination: The patient is a very pleasant 80 y.o. female in no acute distress. She appears well-developed and well-nourished and well groomed. She is obese. She is in a WC. She is in good spirits today.  HEENT: Normocephalic, atraumatic, pupils are equal, round and reactive to light and accommodation. Hearing is intact. Extraocular tracking is good without nystagmus. She has a moderate degree of head tremor, no-no type, stable. She has a slight degree of voice tremor. She is not dysarthric. Oropharynx is clear. She has mild airway crowding. Neck is otherwise supple with full range of motion.   Chest: Clear to auscultation without wheezing, rhonchi or crackles noted.  Heart: S1+S2+0, regular and  normal without murmurs, rubs or gallops noted.   Abdomen: Soft, non-tender and non-distended with normal bowel sounds appreciated on auscultation.  Extremities: There is no pitting edema in the distal lower extremities bilaterally. Pedal pulses are intact.  Skin: Warm and dry without trophic changes noted. There are no varicose veins.  Musculoskeletal: exam reveals no obvious joint deformities, tenderness or joint swelling or erythema.  Neurologically:  Mental status: The patient is awake, alert and oriented in all 4 spheres. His memory, attention, language and knowledge are appropriate. There is no aphasia, agnosia, apraxia or anomia. Speech is clear with normal prosody and enunciation. Thought process is linear. Mood is congruent and affect is normal.  Cranial nerves are as described above under HEENT exam. In addition, shoulder shrug is normal with equal shoulder height noted. Motor exam: Normal bulk, strength and tone is noted on the left side. On the right side she has 4+ out of 5 weakness.. There is no drift, no resting tremor. She has a moderate degree of bilateral upper extremity postural and action tremor. There is no rebound. Romberg was not tested. Reflexes are 2+ throughout. Fine motor skills are mildly impaired bilaterally in the upper and lower extremities. Cerebellar testing shows no dysmetria or intention tremor on finger to nose testing. Heel to shin is unremarkable bilaterally. There is no truncal or gait ataxia.  Sensory exam: intact to light touch but decreased to pinprick, temperature and vibration sense in the distal lower extremities bilaterally up to mid calf, but she also has mild decrease in temperature and pinprick sensation in her right arm.   Gait, station and balance: She stands up slowly with assistance and uses her 2 wheeled walker. She walks slowly and has a very slight limp on the right side. She turns cautiously.   Assessment and Plan:   In summary, [name] [name] is a very pleasant 80 year old female with a history of obesity, previous DVT, ET, diabetes and hypertension, who presents for followup consultation after a recent stroke and a second admission to the hospital with shaking of her right side, concerning for partial seizures. I reviewed her hospital records from her stroke admission but also her second admission carefully. We reviewed test results together. Her he essential tremors stable. Her neuropathy, most likely diabetic neuropathy seems stable. We talked about secondary stroke prevention at length today. I placed a referral for outpatient physical therapy for [facility] physical therapy in [facility]. She is advised to use her 2 wheeled walker with assistance. Depending on physical therapy recommendations we can change the walker to a 4 wheeled walker with seat. She is advised to continue with heart healthy diet. They are advised to have her blood pressure, diabetes numbers and cholesterol numbers checked by her primary care physician next month. She is advised to continue with Plavix daily. I did suggest we reduce her gabapentin to 200 mg twice daily down from 300 mg twice daily because of dizziness and head heaviness reported first thing in the morning. She does not have any concerning history for sleep apnea. For her tremors I have asked her to continue with Mysoline at the same strength. I think she is doing fairly well at this time considering. I answered all their questions today and the patient and her family were in agreement. Her granddaughter was asking me about whether it is safe for her to travel. I discouraged from any longer distance travels by air or by Road we then the first 3-6 months of her stroke to make sure she is making progress and is stable. I suggested we see her back in our clinic here in about 3 months.  Most of my 45 minute visit today was spent in counseling and coordination of care, reviewing test results and reviewing  medication.   Star Age, MD, PhD [facility])

## 2014-08-19 NOTE — Patient Instructions (Addendum)
Continue exercising regularly and take your medications as directed. As discussed, secondary prevention is key after a stroke. This means: taking care of blood sugar values or diabetes management, good blood pressure (hypertension) control and optimizing cholesterol management, exercising daily or regularly within your own mobility limitations of course, and overall cardiovascular risk factor reduction, which includes screening for and treatment of obstructive sleep apnea (OSA) and weight management.    Please remember, that any kind of tremor may be exacerbated by anxiety, anger, nervousness, excitement, dehydration, sleep deprivation, by caffeine, and low blood sugar values or blood sugar fluctuations. Some medications, especially some antidepressants and lithium can cause or exacerbate tremors. Tremors may temporarily calm down her subside with the use of a benzodiazepine such as Valium or related medications and with alcohol. Be aware however that drinking alcohol is not an approved treatment or appropriate treatment for tremor control and long-term use of benzodiazepines such as Valium, lorazepam, alprazolam, or clonazepam can cause habit formation, physical and psychological addiction.

## 2014-08-24 ENCOUNTER — Telehealth: Payer: Self-pay | Admitting: Neurology

## 2014-08-24 NOTE — Telephone Encounter (Signed)
Called and left Vm mail with Mr [name] [name] that referral(Neuro[facility]hab)may be picked up at front desk.Called [facility] and informed Annice PihJackie that patient's son is requesting another facility.

## 2014-08-24 NOTE — Telephone Encounter (Signed)
Patient's son requesting a PT referral sent to [facility].  [facility] that has had [facility].  Please call son and he will pick up order.

## 2014-08-29 NOTE — Telephone Encounter (Signed)
Sent referral to [facility] ,next door

## 2014-08-30 ENCOUNTER — Ambulatory Visit (HOSPITAL_BASED_OUTPATIENT_CLINIC_OR_DEPARTMENT_OTHER): Payer: Medicare Other | Admitting: Physical Medicine & Rehabilitation

## 2014-08-30 ENCOUNTER — Encounter: Payer: Medicare Other | Attending: Physical Medicine & Rehabilitation

## 2014-08-30 ENCOUNTER — Encounter: Payer: Self-pay | Admitting: Physical Medicine & Rehabilitation

## 2014-08-30 ENCOUNTER — Telehealth: Payer: Self-pay | Admitting: Neurology

## 2014-08-30 VITALS — BP 131/51 | HR 80 | Resp 14 | Wt 142.0 lb

## 2014-08-30 DIAGNOSIS — G8190 Hemiplegia, unspecified affecting unspecified side: Secondary | ICD-10-CM | POA: Diagnosis not present

## 2014-08-30 DIAGNOSIS — E780 Pure hypercholesterolemia: Secondary | ICD-10-CM | POA: Diagnosis not present

## 2014-08-30 DIAGNOSIS — Z86718 Personal history of other venous thrombosis and embolism: Secondary | ICD-10-CM | POA: Insufficient documentation

## 2014-08-30 DIAGNOSIS — I639 Cerebral infarction, unspecified: Secondary | ICD-10-CM

## 2014-08-30 DIAGNOSIS — R42 Dizziness and giddiness: Secondary | ICD-10-CM | POA: Diagnosis not present

## 2014-08-30 DIAGNOSIS — I10 Essential (primary) hypertension: Secondary | ICD-10-CM | POA: Diagnosis not present

## 2014-08-30 DIAGNOSIS — I6330 Cerebral infarction due to thrombosis of unspecified cerebral artery: Secondary | ICD-10-CM | POA: Insufficient documentation

## 2014-08-30 DIAGNOSIS — E1140 Type 2 diabetes mellitus with diabetic neuropathy, unspecified: Secondary | ICD-10-CM | POA: Diagnosis not present

## 2014-08-30 NOTE — Progress Notes (Signed)
Subjective:    Patient ID: [name] [name], female    DOB: 1929/12/03, 80 y.o.   MRN: [id number] 80 y.o. right-handed female with history of hypertension, diabetes mellitus peripheral neuropathy as well as recent left thalamus posterior limb of the internal capsule infarct received inpatient rehabilitation services 07/21/2014-07/30/2014 and was discharged to home on Aggrenox ambulating 300 feet with a rolling walker and supervision. Presented 08/05/2014 with increased right-sided weakness. MRI of the brain showed no new stroke but a recent subacute infarct. No TPA was initiated. Patient with bouts of headache felt to be related to her Aggrenox. There was some concerns of possible simple seizure however EEG was negative. She was maintained on prophylactic. Neurology services consulted with followup. Aggrenox was changed to Plavix for CVA prophylaxis  HPI Now receiving outpatient therapy PT Finished up home health therapy. Not very satisfied with that. Has been seen by neurology. No falls at home. Patient still feels weak on the right side Patient still feels dizzy with standing  Pain Inventory Average Pain 0 Pain Right Now 0 My pain is no pain  In the last 24 hours, has pain interfered with the following? General activity 9 Relation with others 9 Enjoyment of life 9 What TIME of day is your pain at its worst? all Sleep (in general) Fair  Pain is worse with: walking Pain improves with: no pain Relief from Meds: no pain  Mobility walk with assistance use a walker do you drive?  no  Function not employed: date last employed .  Neuro/Psych No problems in this area  Prior Studies Any changes since last visit?  no  Physicians involved in your care Any changes since last visit?  no   Family History  Problem Relation Age of Onset  . Coronary artery disease Other   . Stroke Mother   . Heart Problems Sister   . Heart Problems Brother    History   Social History  .  Marital Status: Widowed    Spouse Name: N/A    Number of Children: 5  . Years of Education: 9   Occupational History  .      does not work   Social History Main Topics  . Smoking status: Never Smoker   . Smokeless tobacco: Never Used  . Alcohol Use: No  . Drug Use: No  . Sexual Activity: No   Other Topics Concern  . None   Social History Narrative   Patient is right handed and resides with son   Past Surgical History  Procedure Laterality Date  . Foot surgery    . Cataract extraction Bilateral    Past Medical History  Diagnosis Date  . Hypertension   . High cholesterol   . DVT (deep venous thrombosis)   . Pneumonia   . Anxiety   . Essential tremor 10/27/2013  . Obesity, unspecified 10/27/2013  . Newly diagnosed diabetes 10/27/2013  . Unspecified hereditary and idiopathic peripheral neuropathy 10/27/2013   BP 131/51  Pulse 80  Resp 14  Wt 142 lb (64.411 kg)  SpO2 95%  Opioid Risk Score:   Fall Risk Score: High Fall Risk (>13 points) (previoulsy educated and given handout)  Review of Systems  All other systems reviewed and are negative.      Objective:   Physical Exam Motor strength is 4/5 in the right hip flexor, knee extensors, ankle dorsiflexor 4/5 in the right deltoid, biceps, triceps, grip 5/5 in the left hip flexor knee extensor ankle dorsiflexor as  well as the left deltoid, biceps, triceps, grip No fine motor deficits noted Increased tremor on the left side. The right  Decreased sensation in the right upper and right lower extremity        Assessment & Plan:  1. 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  Dizziness will ask PCP to check orthostatics next visit, some BP meds are being held

## 2014-08-30 NOTE — Patient Instructions (Signed)
Continue outpatient physical therapy  Have your blood pressure measured sitting and standing when you are at the doctor's office

## 2014-08-30 NOTE — Telephone Encounter (Signed)
Angie from [facility] calling to state that son called to schedule outpatient therapy for patient but they do not see an order. Please return call and advise.

## 2014-09-02 ENCOUNTER — Inpatient Hospital Stay: Payer: Self-pay | Admitting: Physical Medicine & Rehabilitation

## 2014-09-05 ENCOUNTER — Telehealth: Payer: Self-pay | Admitting: *Deleted

## 2014-09-05 ENCOUNTER — Ambulatory Visit: Payer: Medicare Other | Admitting: Neurology

## 2014-09-05 NOTE — Telephone Encounter (Signed)
[facility]if[facility] orders received to the patient. Orders are visible they will  Contact to schedule.

## 2014-09-05 NOTE — Telephone Encounter (Signed)
[place] CoasterGina is letting us know that the family has stopped all home health services and has refused to let anyone in the house since 08/18/14.  This is just an [place]

## 2014-09-09 ENCOUNTER — Other Ambulatory Visit: Payer: Self-pay

## 2014-11-16 ENCOUNTER — Ambulatory Visit (INDEPENDENT_AMBULATORY_CARE_PROVIDER_SITE_OTHER): Payer: Medicare Other | Admitting: Neurology

## 2014-11-16 ENCOUNTER — Encounter: Payer: Self-pay | Admitting: Neurology

## 2014-11-16 VITALS — BP 112/71 | HR 81 | Temp 98.0°F | Ht <= 58 in | Wt 158.0 lb

## 2014-11-16 DIAGNOSIS — I6350 Cerebral infarction due to unspecified occlusion or stenosis of unspecified cerebral artery: Secondary | ICD-10-CM

## 2014-11-16 DIAGNOSIS — G250 Essential tremor: Secondary | ICD-10-CM

## 2014-11-16 DIAGNOSIS — E669 Obesity, unspecified: Secondary | ICD-10-CM

## 2014-11-16 DIAGNOSIS — G40109 Localization-related (focal) (partial) symptomatic epilepsy and epileptic syndromes with simple partial seizures, not intractable, without status epilepticus: Secondary | ICD-10-CM

## 2014-11-16 DIAGNOSIS — E0842 Diabetes mellitus due to underlying condition with diabetic polyneuropathy: Secondary | ICD-10-CM

## 2014-11-16 DIAGNOSIS — I639 Cerebral infarction, unspecified: Secondary | ICD-10-CM

## 2014-11-16 MED ORDER — PRIMIDONE 250 MG PO TABS: 250.0000 mg | ORAL_TABLET | Freq: Two times a day (BID) | ORAL | Status: DC

## 2014-11-16 MED ORDER — LEVETIRACETAM 500 MG PO TABS: 500.0000 mg | ORAL_TABLET | Freq: Two times a day (BID) | ORAL | Status: DC

## 2014-11-16 MED ORDER — GABAPENTIN 100 MG PO CAPS: ORAL_CAPSULE | ORAL | Status: DC

## 2014-11-16 NOTE — Patient Instructions (Signed)
We will keep your Mysoline and Keppra the same.  We will reduce your gabapentin to 100 mg in the morning and 200 mg at night.

## 2014-11-16 NOTE — Progress Notes (Signed)
Subjective:    Patient ID: [name] [name] is a 80 y.o. female.  HPI     Interim history:  [name] [name] is a very pleasant 80 year old [place] lady with an underlying medical history of hypertension, hyperlipidemia, DVT, pneumonia, anxiety, obesity, diabetes, peripheral neuropathy, stroke and essential tremor, who presents for follow-up consultation of her essential tremor and her history of stroke. She is accompanied by her son again today. I last saw her on 08/19/2014 at which time she presented after her hospitalization for stroke. Her son reported that Aggrenox was causing her headaches and she was switched to Plavix. We talked about sleep apnea screening. She had no witnessed apneas and only mild snoring. She had done fairly well. They requested a referral to outpatient therapy in [facility]. She was placed on Keppra for episodic twitching. She had had some medication changes when she was hospitalized for her stroke. She was advised to use her 2 wheeled walker with assistance. I referred her to physical therapy outpatient. We talked about secondary stroke prevention. I suggested she reduce gabapentin to 200 mg twice daily down from 300 mg twice daily because of dizziness and head heaviness reported. This continued on Mysoline for her essential tremor.  Today, her son reports, she is doing well. She has been working with [facility] outpatient PT and uses her 2 wheeled walker well. She has been taking the gabapentin 200 mg bid, and did not notice any adverse consequences from going down from 300 mg bid, but does feel, her heavy headedness first thing in the morning is better. Her tremor is the same.  She is still taking Keppra twice daily 500 mg strength. Thankfully she has not had any seizures and is doing well overall.  I saw her on 10/27/2013 at which time she presented for followup of her long-standing history of essential tremor. I felt she was stable in that regard and felt that  her diabetes had resulted in diabetic neuropathy. I asked her to continue with Mysoline at the same strength and increased her gabapentin to 3 pills at night for a total of 300 mg each bedtime.   In the interim she presented to the emergency room on 07/18/2014 with new onset right-sided weakness, facial droop, slurring of speech which started around 6 PM the night before. She was in [place], [place] visiting family when she had acute onset of the symptoms. She was seen in [place] at the hospital and CT head was negative for bleed but she did not receive TPA because of low platelet count of 90,000. She had improvement of her symptoms but persistent deficits. She was admitted to the hospital and had a complete stroke workup. She had an MRI of the brain as well as MRA head and was found to have a left thalamic ischemic stroke. Neurology was consulted. MRA showed left PCA occlusion. Echocardiogram showed an EF of 60-65% with no wall motion abnormalities and no source of emboli. Carotid Doppler studies were negative for any significant ICA stenosis (1-39%). Hb A1c was 6. LDL was 62. She was placed on aspirin for secondary stroke prevention and then changed to Aggrenox. Blood work showed normal B12 levels, negative hepatitis serology, chest x-ray was negative. EKG did not show any ST abnormalities. She had some left leg pain. Lower extremity Doppler studies from 07/20/2014 were negative for DVT. She was discharged on 07/21/2014 to inpatient rehabilitation. She was discharged from rehabilitation on 08/02/2014. I reviewed hospital records as well as  imaging tests. Her daughter called and asked for a sooner than scheduled appointment because of worsening weakness. However, she missed an appointment on 08/05/2014 because she was readmitted to the hospital on 08/04/2014 for slurring of speech and shaking spells. I reviewed the hospital records. She had an EEG, which was negative for seizure activity but she was  felt to have partial onset seizures and was started on Keppra which is currently at 500 mg twice daily. She was changed from Aggrenox to Plavix 75 mg.   She had a brain MRI without contrast on 08/05/2014: 1. Persistent 1.1 cm focus of linear restricted diffusion within the lateral left thalamus near the posterior limb of the left internal capsule. Finding is favored to reflect resolving infarct from the 07/19/2014 study given the relative decreased signal intensity and size from that prior exam, although possible acute re-infarction of this area is not entirely excluded. 2. No other acute intracranial abnormality. In addition, I personally reviewed the images through the PACS system. She had an EEG on 08/06/2014: This was reported as normal in the asleep and awake states. Her son called on 08/15/2014 to make an appointment.  I first met her on 01/19/2013, at which time I increased her Mysoline 250 mg strength 1-1/2 pills to 2 pills daily. I did advise her of the potential increase risk of side effects. She previously followed with Dr. Morene Antu. She has a billateral UE and head tremor for over 25 years. She was tried on primidone while she was in [place] with improvement in tremor. It was discontinued when she came to the [place] because she was on Coumadin. Her tremor affects her ability to feed herself, write, and put on her makeup and is worse with anxiety. Her daughter has a tremor as well. She was restarted on primidone by Dr. Erling Cruz in 3/12. She is no longer on coumadin. She was on coumadin for a history of DVT in her left leg following left foot injury. She has had numbness in her legs without pain. EMG/NCV 02/27/11 was normal except for some denervation in the left lumbosacral paraspinal muscles. B12 level was normal. She is on gabapentin 100 mg 2 at night with relief of symptoms. Her major complaint is that of ongoing tremor, which is worse in the morning. She has not had side effects such as  grogginess or difficulty walking with the primidone. She no longer cooks. Her tremor involves her writing and abilities to feed herself. She also continues to take gabapentin 11m 2 qHS for her neuropathy. She has not fallen per son. She was supposed to come back in 3 months but missed several appointments. She has been diagnosed with DM and has been started on metforim. She feels, her numbness and tingling is worse in her feet and she has now has started noticing burning sensation in her feet.   Her Past Medical History Is Significant For: Past Medical History  Diagnosis Date  . Hypertension   . High cholesterol   . DVT (deep venous thrombosis)   . Pneumonia   . Anxiety   . Essential tremor 10/27/2013  . Obesity, unspecified 10/27/2013  . Newly diagnosed diabetes 10/27/2013  . Unspecified hereditary and idiopathic peripheral neuropathy 10/27/2013    Her Past Surgical History Is Significant For: Past Surgical History  Procedure Laterality Date  . Foot surgery    . Cataract extraction Bilateral     Her Family History Is Significant For: Family History  Problem Relation Age of Onset  .  Coronary artery disease Other   . Stroke Mother   . Heart Problems Sister   . Heart Problems Brother     Her Social History Is Significant For: History   Social History  . Marital Status: Widowed    Spouse Name: N/A    Number of Children: 20  . Years of Education: 9   Occupational History  .      does not work   Social History Main Topics  . Smoking status: Never Smoker   . Smokeless tobacco: Never Used  . Alcohol Use: No  . Drug Use: No  . Sexual Activity: No   Other Topics Concern  . None   Social History Narrative   Patient is right handed and resides with son    Her Allergies Are:  Allergies  Allergen Reactions  . Tape Rash    Please do not use Plastic Tape  :   Her Current Medications Are:  Outpatient Encounter Prescriptions as of 11/16/2014  Medication Sig  .  ACCU-CHEK AVIVA PLUS test strip   . ACCU-CHEK SOFTCLIX LANCETS lancets   . ALPRAZolam (XANAX) 0.25 MG tablet Take 1 tablet (0.25 mg total) by mouth daily as needed for anxiety.  [name] atorvastatin (LIPITOR) 20 MG tablet Take 20 mg by mouth every evening.   . clopidogrel (PLAVIX) 75 MG tablet Take 1 tablet (75 mg total) by mouth daily.  [name] docusate sodium (COLACE) 100 MG capsule Take 1 capsule (100 mg total) by mouth 2 (two) times daily.  [name] gabapentin (NEURONTIN) 100 MG capsule Take 3 capsules (300 mg total) by mouth 2 (two) times daily.  [name] levETIRAcetam (KEPPRA) 500 MG tablet Take 1 tablet (500 mg total) by mouth 2 (two) times daily.  . polyethylene glycol (MIRALAX / GLYCOLAX) packet Take 17 g by mouth daily as needed for mild constipation.  . primidone (MYSOLINE) 250 MG tablet Take 1 tablet (250 mg total) by mouth 2 (two) times daily.  . RESTASIS 0.05 % ophthalmic emulsion   . Triamcinolone Acetonide (TRIAMCINOLONE 0.1 % CREAM : EUCERIN) CREA Apply 1 application topically 3 (three) times daily as needed for rash.  :  Review of Systems:  Out of a complete 14 point review of systems, all are reviewed and negative with the exception of these symptoms as listed below:   Review of Systems  Neurological:       Heavy head when waking up    Objective:  Neurologic Exam  Physical Exam Physical Examination:   Filed Vitals:   11/16/14 1121  BP: 112/71  Pulse: 81  Temp: 98 F (36.7 C)   General Examination: The patient is a very pleasant 80 y.o. female in no acute distress. She appears well-developed and well-nourished and well groomed. She is obese. She is situated in her chair and brought in her 2 wheeled walker. She is in good spirits today.  HEENT: Normocephalic, atraumatic, pupils are equal, round and reactive to light and accommodation. Hearing is intact. Extraocular tracking is good without nystagmus. She has a  mild to moderate degree of head tremor, no-no type, stable. She has a slight  degree of voice tremor. She is not dysarthric. Oropharynx is clear. She has mild airway crowding. Neck is otherwise supple with full range of motion.   Chest: Clear to auscultation without wheezing, rhonchi or crackles noted.  Heart: S1+S2+0, regular and normal without murmurs, rubs or gallops noted.   Abdomen: Soft, non-tender and non-distended with normal bowel sounds appreciated on auscultation.  Extremities: There is no pitting edema in the distal lower extremities bilaterally. Pedal pulses are intact.  Skin: Warm and dry without trophic changes noted. There are no varicose veins.  Musculoskeletal: exam reveals no obvious joint deformities, tenderness or joint swelling or erythema.   Neurologically:  Mental status: The patient is awake, alert and oriented in all 4 spheres. His memory, attention, language and knowledge are appropriate. There is no aphasia, agnosia, apraxia or anomia. Speech is clear with normal prosody and enunciation. Thought process is linear. Mood is congruent and affect is normal.  Cranial nerves are as described above under HEENT exam. In addition, shoulder shrug is normal with equal shoulder height noted. Motor exam: Normal bulk, strength and tone is noted on the left side. On the right side she has  Minimal residual weakness. There is no drift, no resting tremor. She has a mild degree of bilateral upper extremity postural and action tremor. There is no rebound. Romberg was not tested. Reflexes are 1+ throughout. Fine motor skills are mildly impaired bilaterally in the upper and lower extremities. Cerebellar testing shows no dysmetria or intention tremor on finger to nose testing. Heel to shin is difficult for her. There is no truncal or gait ataxia.  Sensory exam: intact to light touch but decreased to pinprick, temperature and vibration sense in the distal lower extremities bilaterally up to mid calf, but she also has mild decrease in temperature and pinprick sensation  in her right arm.   Gait, station and balance: She stands up slowly with assistance and uses her 2 wheeled walker. She walks slowly and has a very slight limp on the right side. She turns cautiously.   Assessment and Plan:   In summary, Delvina B Galligan is a very pleasant 80 year old female with a history of obesity, previous DVT, ET, and stroke and concern for partial seizures. She has done well.  She had a complete stroke workup. She was readmitted for involuntary twitching and was placed on Keppra which she has been taking regularly and is tolerating at this time.  She is no longer on diabetes medication. She has diabetic neuropathy which appears to be stable. She does not have much in the way of pain but does report some tingling. We reduced her gabapentin last time because of some sedation and head heaviness reported which has actually improved on the lower dose. We can try to lower her further by  Reducing the morning dose to 100 mg in the evening dose can stay the same at 200 mg. Her primidone will stay the same at 250 mg twice daily and for now we will continue Keppra at 500 mg twice daily. Her son is inquiring whether she will need to take it much longer. I would like to suggest that if she is free of seizure-like spells in a year we will do another EEG and consider tapering the Keppra , if the EEG is fine.  Her weakness from her stroke has improved. She still feels more subjective issues sometimes with right leg heaviness. Otherwise she agrees that she has done well and she feels good. She needs regular checkup for her blood pressure, diabetes and cholesterol with her primary care physician and I reminded her son that she will need blood work to monitor liver and kidney function. They were in agreement. I can see her back in 6 months, sooner if needed. I'm pleased to see but she has done better.  They are encouraged to email or call  with any interim questions or concerns. I renewed her prescription  for gabapentin, Keppra and Mysoline today.

## 2014-11-24 ENCOUNTER — Other Ambulatory Visit: Payer: Self-pay | Admitting: Neurology

## 2014-12-27 ENCOUNTER — Other Ambulatory Visit: Payer: Self-pay | Admitting: Physical Medicine and Rehabilitation

## 2015-01-19 ENCOUNTER — Other Ambulatory Visit: Payer: Self-pay

## 2015-01-19 DIAGNOSIS — Z1231 Encounter for screening mammogram for malignant neoplasm of breast: Secondary | ICD-10-CM

## 2015-01-24 ENCOUNTER — Other Ambulatory Visit: Payer: Self-pay

## 2015-01-24 ENCOUNTER — Ambulatory Visit
Admission: RE | Admit: 2015-01-24 | Discharge: 2015-01-24 | Disposition: A | Discharge status: Home / Self Care | Payer: Medicare Other | Source: Ambulatory Visit

## 2015-01-24 ENCOUNTER — Ambulatory Visit: Payer: Self-pay

## 2015-01-24 DIAGNOSIS — Z1231 Encounter for screening mammogram for malignant neoplasm of breast: Secondary | ICD-10-CM

## 2015-01-25 ENCOUNTER — Other Ambulatory Visit: Payer: Self-pay | Admitting: Internal Medicine

## 2015-01-25 DIAGNOSIS — R928 Other abnormal and inconclusive findings on diagnostic imaging of breast: Secondary | ICD-10-CM

## 2015-01-29 ENCOUNTER — Other Ambulatory Visit: Payer: Self-pay | Admitting: Physical Medicine and Rehabilitation

## 2015-01-30 ENCOUNTER — Ambulatory Visit
Admission: RE | Admit: 2015-01-30 | Discharge: 2015-01-30 | Disposition: A | Discharge status: Home / Self Care | Payer: Medicare Other | Source: Ambulatory Visit | Attending: Internal Medicine | Admitting: Internal Medicine

## 2015-01-30 DIAGNOSIS — R928 Other abnormal and inconclusive findings on diagnostic imaging of breast: Secondary | ICD-10-CM

## 2015-02-27 ENCOUNTER — Other Ambulatory Visit: Payer: Self-pay | Admitting: Neurology

## 2015-02-27 NOTE — Telephone Encounter (Signed)
Prescribed on 09/25 at [facility]

## 2015-03-17 ENCOUNTER — Ambulatory Visit: Payer: Medicare Other | Admitting: Neurology

## 2015-04-06 ENCOUNTER — Inpatient Hospital Stay: Admission: RE | Admit: 2015-04-06 | Payer: Medicare Other | Source: Ambulatory Visit

## 2015-04-09 ENCOUNTER — Other Ambulatory Visit: Payer: Self-pay | Admitting: Neurology

## 2015-04-20 ENCOUNTER — Ambulatory Visit
Admission: RE | Admit: 2015-04-20 | Discharge: 2015-04-20 | Disposition: A | Discharge status: Home / Self Care | Payer: Medicare Other | Source: Ambulatory Visit | Attending: Internal Medicine | Admitting: Internal Medicine

## 2015-04-26 ENCOUNTER — Other Ambulatory Visit (HOSPITAL_COMMUNITY): Payer: Self-pay | Admitting: *Deleted

## 2015-04-26 ENCOUNTER — Ambulatory Visit (INDEPENDENT_AMBULATORY_CARE_PROVIDER_SITE_OTHER): Payer: Medicare Other | Admitting: Neurology

## 2015-04-26 ENCOUNTER — Encounter: Payer: Self-pay | Admitting: Neurology

## 2015-04-26 VITALS — BP 116/62 | HR 62 | Resp 14 | Ht <= 58 in | Wt 155.0 lb

## 2015-04-26 DIAGNOSIS — R531 Weakness: Secondary | ICD-10-CM

## 2015-04-26 DIAGNOSIS — G250 Essential tremor: Secondary | ICD-10-CM

## 2015-04-26 DIAGNOSIS — E0842 Diabetes mellitus due to underlying condition with diabetic polyneuropathy: Secondary | ICD-10-CM | POA: Diagnosis not present

## 2015-04-26 DIAGNOSIS — M6289 Other specified disorders of muscle: Secondary | ICD-10-CM | POA: Diagnosis not present

## 2015-04-26 DIAGNOSIS — G40109 Localization-related (focal) (partial) symptomatic epilepsy and epileptic syndromes with simple partial seizures, not intractable, without status epilepticus: Secondary | ICD-10-CM | POA: Diagnosis not present

## 2015-04-26 DIAGNOSIS — E669 Obesity, unspecified: Secondary | ICD-10-CM

## 2015-04-26 MED ORDER — PRIMIDONE 250 MG PO TABS: 250.0000 mg | ORAL_TABLET | Freq: Two times a day (BID) | ORAL | Status: DC

## 2015-04-26 MED ORDER — GABAPENTIN 100 MG PO CAPS: 100.0000 mg | ORAL_CAPSULE | Freq: Two times a day (BID) | ORAL | Status: DC

## 2015-04-26 MED ORDER — LEVETIRACETAM 500 MG PO TABS: 500.0000 mg | ORAL_TABLET | Freq: Two times a day (BID) | ORAL | Status: DC

## 2015-04-26 NOTE — Patient Instructions (Signed)
We will reduce gabapentin 100 mg to 1 pill 2 times a day.   We will continue with mysoline, and keppra.

## 2015-04-26 NOTE — Progress Notes (Signed)
Subjective:    Patient ID: [name] [name] is a 81 y.o. female.  HPI     Interim history:   [name] [name] is a very pleasant 81 year old [place] lady with an underlying medical history of hypertension, hyperlipidemia, DVT, pneumonia, anxiety, obesity, diabetes, peripheral neuropathy, stroke and essential tremor, who presents for follow-up consultation of [name] essential tremor and [name] history of stroke. She is accompanied by [name] son again today. I last saw [name] on 11/16/2014, at which time [name] son reported that she was doing well. She was in outpatient physical therapy and was using a 2 wheeled walker. She was taking gabapentin at a lower dose. Lowering gabapentin had improved [name] heavy headedness in the morning. [name] tremor was stable. She was tolerating Keppra 500 mg twice daily with no recent seizures or auras reported. Overall she has done well. I continued [name] on the same dose of primidone, the same dose of Keppra, and suggested we reduce [name] gabapentin further to 100 mg in the morning and 200 mg in the evening.   Of note, they canceled an appointment for 03/17/2015.  Today, 04/26/2015: She reports slight difficulty with [name] balance. [name] tremor may have become a little worse. She has had no recent seizures or auras. She tolerates Keppra 500 mg twice daily. She is on the same dose of Mysoline for [name] tremors. She benefited from physical therapy at [facility] and would like to go back for outpatient physical therapy. Thankfully she has not fallen. She walks with a 4 pronged cane and has not had to use [name] walker.  Previously:   I saw [name] on 08/19/2014 at which time she presented after [name] hospitalization for stroke. [name] son reported that Aggrenox was causing [name] headaches and she was switched to Plavix. We talked about sleep apnea screening. She had no witnessed apneas and only mild snoring. She had done fairly well. They requested a referral to outpatient therapy in [facility]. She  was placed on Keppra for episodic twitching. She had had some medication changes when she was hospitalized for [name] stroke. She was advised to use [name] 2 wheeled walker with assistance. I referred [name] to physical therapy outpatient. We talked about secondary stroke prevention. I suggested she reduce gabapentin to 200 mg twice daily down from 300 mg twice daily because of dizziness and head heaviness reported. This continued on Mysoline for [name] essential tremor.  I saw [name] on 10/27/2013 at which time she presented for followup of [name] long-standing history of essential tremor. I felt she was stable in that regard and felt that [name] diabetes had resulted in diabetic neuropathy. I asked [name] to continue with Mysoline at the same strength and increased [name] gabapentin to 3 pills at night for a total of 300 mg each bedtime.   In the interim she presented to the emergency room on 07/18/2014 with new onset right-sided weakness, facial droop, slurring of speech which started around 6 PM the night before. She was in [place], [place] visiting family when she had acute onset of the symptoms. She was seen in [place] at the hospital and CT head was negative for bleed but she did not receive TPA because of low platelet count of 90,000. She had improvement of [name] symptoms but persistent deficits. She was admitted to the hospital and had a complete stroke workup. She had an MRI of the brain as well as MRA head and was found to have a left thalamic ischemic stroke. Neurology was  consulted. MRA showed left PCA occlusion. Echocardiogram showed an EF of 60-65% with no wall motion abnormalities and no source of emboli. Carotid Doppler studies were negative for any significant ICA stenosis (1-39%). Hb A1c was 6. LDL was 62. She was placed on aspirin for secondary stroke prevention and then changed to Aggrenox. Blood work showed normal B12 levels, negative hepatitis serology, chest x-ray was negative. EKG did not show any ST  abnormalities. She had some left leg pain. Lower extremity Doppler studies from 07/20/2014 were negative for DVT. She was discharged on 07/21/2014 to inpatient rehabilitation. She was discharged from rehabilitation on 08/02/2014. I reviewed hospital records as well as imaging tests. [name] [name] called and asked for a sooner than scheduled appointment because of worsening weakness. However, she missed an appointment on 08/05/2014 because she was readmitted to the hospital on 08/04/2014 for slurring of speech and shaking spells. I reviewed the hospital records. She had an EEG, which was negative for seizure activity but she was felt to have partial onset seizures and was started on Keppra which is currently at 500 mg twice daily. She was changed from Aggrenox to Plavix 75 mg.   She had a brain MRI without contrast on 08/05/2014: 1. Persistent 1.1 cm focus of linear restricted diffusion within the lateral left thalamus near the posterior limb of the left internal capsule. Finding is favored to reflect resolving infarct from the 07/19/2014 study given the relative decreased signal intensity and size from that prior exam, although possible acute re-infarction of this area is not entirely excluded. 2. No other acute intracranial abnormality. In addition, I personally reviewed the images through the PACS system. She had an EEG on 08/06/2014: This was reported as normal in the asleep and awake states. [name] son called on 08/15/2014 to make an appointment.  I first met [name] on 01/19/2013, at which time I increased [name] Mysoline 250 mg strength 1-1/2 pills to 2 pills daily. I did advise [name] of the potential increase risk of side effects. She previously followed with Dr. Morene Antu. She has a billateral UE and head tremor for over 25 years. She was tried on primidone while she was in [place] with improvement in tremor. It was discontinued when she came to the [place] because she was on Coumadin. [name] tremor affects [name]  ability to feed herself, write, and put on [name] makeup and is worse with anxiety. [name] [name] has a tremor as well. She was restarted on primidone by Dr. Erling Cruz in 3/12. She is no longer on coumadin. She was on coumadin for a history of DVT in [name] left leg following left foot injury. She has had numbness in [name] legs without pain. EMG/NCV 02/27/11 was normal except for some denervation in the left lumbosacral paraspinal muscles. B12 level was normal. She is on gabapentin 100 mg 2 at night with relief of symptoms. [name] major complaint is that of ongoing tremor, which is worse in the morning. She has not had side effects such as grogginess or difficulty walking with the primidone. She no longer cooks. [name] tremor involves [name] writing and abilities to feed herself. She also continues to take gabapentin 122m 2 qHS for [name] neuropathy. She has not fallen per son. She was supposed to come back in 3 months but missed several appointments. She has been diagnosed with DM and has been started on metforim. She feels, [name] numbness and tingling is worse in [name] feet and she has now has started noticing burning sensation in  [name] feet.    [name] Past Medical History Is Significant For: Past Medical History  Diagnosis Date  . Hypertension   . High cholesterol   . DVT (deep venous thrombosis)   . Pneumonia   . Anxiety   . Essential tremor 10/27/2013  . Obesity, unspecified 10/27/2013  . Newly diagnosed diabetes 10/27/2013  . Unspecified hereditary and idiopathic peripheral neuropathy 10/27/2013    [name] Past Surgical History Is Significant For: Past Surgical History  Procedure Laterality Date  . Foot surgery    . Cataract extraction Bilateral     [name] Family History Is Significant For: Family History  Problem Relation Age of Onset  . Coronary artery disease Other   . Stroke Mother   . Heart Problems Sister   . Heart Problems Brother     [name] Social History Is Significant For: History   Social History  . Marital  Status: Widowed    Spouse Name: N/A  . Number of Children: 5  . Years of Education: 9   Occupational History  .      does not work   Social History Main Topics  . Smoking status: Never Smoker   . Smokeless tobacco: Never Used  . Alcohol Use: No  . Drug Use: No  . Sexual Activity: No   Other Topics Concern  . None   Social History Narrative   Patient is right handed and resides with son    [name] Allergies Are:  Allergies  Allergen Reactions  . Tape Rash    Please do not use Plastic Tape  :   [name] Current Medications Are:  Outpatient Encounter Prescriptions as of 04/26/2015  Medication Sig  . ACCU-CHEK AVIVA PLUS test strip   . ACCU-CHEK SOFTCLIX LANCETS lancets   . ALPRAZolam (XANAX) 0.25 MG tablet Take 1 tablet (0.25 mg total) by mouth daily as needed for anxiety.  [name] amLODipine (NORVASC) 2.5 MG tablet Take 2.5 mg by mouth daily.  [name] atorvastatin (LIPITOR) 20 MG tablet Take 20 mg by mouth every evening.   . clopidogrel (PLAVIX) 75 MG tablet TAKE 1 TABLET BY MOUTH EVERY DAY  . docusate sodium (COLACE) 100 MG capsule Take 1 capsule (100 mg total) by mouth 2 (two) times daily.  [name] gabapentin (NEURONTIN) 100 MG capsule TAKE ONE CAPSULE BY MOUTH EVERY MORNING TAKE 2 CAPSULES BY MOUTH AT BEDTIME  . levETIRAcetam (KEPPRA) 500 MG tablet Take 1 tablet (500 mg total) by mouth 2 (two) times daily.  . polyethylene glycol (MIRALAX / GLYCOLAX) packet DISSOLVE 1 PACKAGE (17 G) IN 4 TO 8 OUNCES OF LIQUID AND DRINK 2 TIMES DAILY.  [name] primidone (MYSOLINE) 250 MG tablet Take 1 tablet (250 mg total) by mouth 2 (two) times daily.  . primidone (MYSOLINE) 250 MG tablet TAKE 1 TABLET (250 MG TOTAL) BY MOUTH 2 (TWO) TIMES DAILY.  [name] RESTASIS 0.05 % ophthalmic emulsion   . Triamcinolone Acetonide (TRIAMCINOLONE 0.1 % CREAM : EUCERIN) CREA Apply 1 application topically 3 (three) times daily as needed for rash.  . [DISCONTINUED] polyethylene glycol (MIRALAX / GLYCOLAX) packet Take 17 g by mouth daily as  needed for mild constipation.   No facility-administered encounter medications on file as of 04/26/2015.  :  Review of Systems:  Out of a complete 14 point review of systems, all are reviewed and negative with the exception of these symptoms as listed below:  Review of Systems  Neurological:       Patient feels like she is unbalanced, requests  referral to PT. No falls reported.     Objective:  Neurologic Exam  Physical Exam Physical Examination:   Filed Vitals:   04/26/15 1147  BP: 116/62  Pulse: 62  Resp: 14   General Examination: The patient is a very pleasant 81 y.o. female in no acute distress. She appears well-developed and well-nourished and well groomed. She is obese. She is situated in [name] chair and brought in [name] 4 prong cane today. As usual, she is in good spirits today.  HEENT: Normocephalic, atraumatic, pupils are equal, round and reactive to light and accommodation. Hearing is intact. Extraocular tracking is good without nystagmus. She has a  mild to moderate degree of head tremor, no-no type, stable. She has a slight degree of voice tremor. She is not dysarthric. Oropharynx is clear. She has mild airway crowding. Neck is otherwise supple with full range of motion.   Chest: Clear to auscultation without wheezing, rhonchi or crackles noted.  Heart: S1+S2+0, regular and normal without murmurs, rubs or gallops noted.   Abdomen: Soft, non-tender and non-distended with normal bowel sounds appreciated on auscultation.  Extremities: There is no pitting edema in the distal lower extremities bilaterally. Pedal pulses are intact.  Skin: Warm and dry without trophic changes noted. There are no varicose veins.  Musculoskeletal: exam reveals no obvious joint deformities, tenderness or joint swelling or erythema.   Neurologically:  Mental status: The patient is awake, alert and oriented in all 4 spheres. His memory, attention, language and knowledge are appropriate. There is no  aphasia, agnosia, apraxia or anomia. Speech is clear with normal prosody and enunciation. Thought process is linear. Mood is congruent and affect is normal.  Cranial nerves are as described above under HEENT exam. In addition, shoulder shrug is normal with equal shoulder height noted. Motor exam: Normal bulk, strength and tone is noted on the left side. On the right side she has minimal residual weakness, in [name] right grip and hip flexion primarily, stable from last time. There is no drift, but she does have a slight resting tremor bilaterally. She has a mild to moderate bilateral upper extremity postural and action tremor. There is no rebound. Romberg was not tested. Reflexes are 1+ throughout. Fine motor skills are mildly impaired bilaterally in the upper and lower extremities. Cerebellar testing shows no dysmetria or intention tremor on finger to nose testing. Heel to shin is difficult for [name]. There is no truncal or gait ataxia.  Sensory exam: intact to light touch but decreased to pinprick, temperature and vibration sense in the distal lower extremities bilaterally up to mid calf, but she also has mild decrease in temperature and pinprick sensation in [name] right arm.   Gait, station and balance: She stands up slowly with assistance and uses [name] 4-prong cane in the left hand. She walks slowly and has a very slight limp on the right side. She turns cautiously. Tandem walk is not possible.   Assessment and Plan:   In summary, Edlyn B Scogin is a very pleasant 81 year old female with a history of obesity, previous DVT, ET, stroke as well as new onset partial seizures about 9 months ago. She has done remarkably well.  She had a complete stroke workup. She was readmitted for involuntary twitching and was placed on Keppra which she has been taking regularly and is tolerating at this time, 500 mg bid.  she has no recent seizure activity or auras. She has noted a difficulty with [name] balance. I explained to  the  patient and [name] son that balance problems are more likely to be a combination of different issues including slight residual weakness from [name] stroke, aging, medication side effect and tremor.  I will request outpatient physical therapy. They would like to go back to [facility] for this. I made a referral. She is no longer on diabetes medication.  they check blood sugar values at home. They seem to be fine. She has diabetic neuropathy which appears to be stable. She does not have much in the way of pain but does report some tingling. We reduced [name] gabapentin last time because of some sedation and head heaviness reported, which has actually improved on the lower dose. We can try to lower it further to 100 mg bid. [name] primidone will stay the same at 250 mg twice daily and for now we will continue Keppra at 500 mg twice daily. If she remains free of seizure-like spells after a year we will do another EEG and consider tapering the Keppra, but I would not push for it too aggressively for fear of recurrence of Sz as she is at risk due to [name] prior stroke. [name] residual weakness from [name] stroke is stable. I  suggested I see [name] back in  about 4-5 months, sooner if the need arises. I renewed all [name] prescriptions today for 90 days supply.  I spent 15 minutes in total face-to-face time with the patient, more than 50% of which was spent in counseling and coordination of care, reviewing test results, reviewing medication and discussing or reviewing the diagnosis of ET, stroke, sz, the prognosis and treatment options.

## 2015-04-27 ENCOUNTER — Encounter (HOSPITAL_COMMUNITY)
Admission: RE | Admit: 2015-04-27 | Discharge: 2015-04-27 | Disposition: A | Discharge status: Home / Self Care | Payer: Medicare Other | Source: Ambulatory Visit | Attending: Rheumatology | Admitting: Rheumatology

## 2015-04-27 DIAGNOSIS — M810 Age-related osteoporosis without current pathological fracture: Secondary | ICD-10-CM | POA: Diagnosis not present

## 2015-04-27 MED ORDER — ZOLEDRONIC ACID 5 MG/100ML IV SOLN
5.0000 mg | Freq: Once | INTRAVENOUS | Status: AC
  Administered 2015-04-27: 5 mg via INTRAVENOUS

## 2015-04-27 MED ORDER — ZOLEDRONIC ACID 5 MG/100ML IV SOLN
INTRAVENOUS | Status: AC
  Administered 2015-04-27: 5 mg via INTRAVENOUS
  Filled 2015-04-27: qty 100

## 2015-05-22 ENCOUNTER — Other Ambulatory Visit: Payer: Self-pay

## 2015-09-25 ENCOUNTER — Encounter: Payer: Self-pay | Admitting: Neurology

## 2015-09-25 ENCOUNTER — Ambulatory Visit (INDEPENDENT_AMBULATORY_CARE_PROVIDER_SITE_OTHER): Payer: Medicare Other | Admitting: Neurology

## 2015-09-25 VITALS — BP 112/60 | HR 70 | Resp 16 | Ht <= 58 in | Wt 154.0 lb

## 2015-09-25 DIAGNOSIS — M6289 Other specified disorders of muscle: Secondary | ICD-10-CM

## 2015-09-25 DIAGNOSIS — G40109 Localization-related (focal) (partial) symptomatic epilepsy and epileptic syndromes with simple partial seizures, not intractable, without status epilepticus: Secondary | ICD-10-CM

## 2015-09-25 DIAGNOSIS — G250 Essential tremor: Secondary | ICD-10-CM

## 2015-09-25 DIAGNOSIS — R531 Weakness: Secondary | ICD-10-CM

## 2015-09-25 NOTE — Progress Notes (Signed)
Subjective:    Patient ID: [name] [name] is a 81 y.o. female.  HPI     Interim history:   [name] [name] is a very pleasant 81 year old Middle Eastern/[place]ian lady with an underlying medical history of hypertension, hyperlipidemia, DVT, pneumonia, anxiety, obesity, diabetes, peripheral neuropathy, stroke and essential tremor, who presents for follow-up consultation of her essential tremor and her history of stroke. She is accompanied by her son again today. I last saw her on 04/26/2015 at which time she reported difficulty with her balance. Her tremor had become a little worse. She had had no recent seizures or auras. She was able to tolerate Keppra 500 mg twice daily. She was on the same dose of Mysoline for her tremors. She had benefited from physical therapy at [facility] and requested to go back for outpatient physical therapy. She had not fallen. She was using a 4 pronged cane and was no longer using her walker.  Today, 09/25/15: She reports doing well from our end of things. She requested to move of the appointment as she has to fly to [place] tomorrow to be with her daughter and other family. She is tearful, as she reports, that her older brother, who lives in [place] fell and injured himself. She cannot travel to [place] and is sad about it. Her son felt that she would do better staying with her daughters for a while. Her balance is the same. She has had no recent seizures. Her tremor is the same. She uses a cane to walk. She has not fallen.    Previously:   I saw her on 11/16/2014, at which time her son reported that she was doing well. She was in outpatient physical therapy and was using a 2 wheeled walker. She was taking gabapentin at a lower dose. Lowering gabapentin had improved her heavy headedness in the morning. Her tremor was stable. She was tolerating Keppra 500 mg twice daily with no recent seizures or auras reported. Overall she has done well. I continued her on the same dose of  primidone, the same dose of Keppra, and suggested we reduce her gabapentin further to 100 mg in the morning and 200 mg in the evening.   Of note, they canceled an appointment for 03/17/2015.  I saw her on 08/19/2014 at which time she presented after her hospitalization for stroke. Her son reported that Aggrenox was causing her headaches and she was switched to Plavix. We talked about sleep apnea screening. She had no witnessed apneas and only mild snoring. She had done fairly well. They requested a referral to outpatient therapy in [facility]. She was placed on Keppra for episodic twitching. She had had some medication changes when she was hospitalized for her stroke. She was advised to use her 2 wheeled walker with assistance. I referred her to physical therapy outpatient. We talked about secondary stroke prevention. I suggested she reduce gabapentin to 200 mg twice daily down from 300 mg twice daily because of dizziness and head heaviness reported. This continued on Mysoline for her essential tremor.  I saw her on 10/27/2013 at which time she presented for followup of her long-standing history of essential tremor. I felt she was stable in that regard and felt that her diabetes had resulted in diabetic neuropathy. I asked her to continue with Mysoline at the same strength and increased her gabapentin to 3 pills at night for a total of 300 mg each bedtime.   In the interim she presented to the  emergency room on 07/18/2014 with new onset right-sided weakness, facial droop, slurring of speech which started around 6 PM the night before. She was in [place], [place] visiting family when she had acute onset of the symptoms. She was seen in [place] at the hospital and CT head was negative for bleed but she did not receive TPA because of low platelet count of 90,000. She had improvement of her symptoms but persistent deficits. She was admitted to the hospital and had a complete stroke workup. She had an  MRI of the brain as well as MRA head and was found to have a left thalamic ischemic stroke. Neurology was consulted. MRA showed left PCA occlusion. Echocardiogram showed an EF of 60-65% with no wall motion abnormalities and no source of emboli. Carotid Doppler studies were negative for any significant ICA stenosis (1-39%). Hb A1c was 6. LDL was 62. She was placed on aspirin for secondary stroke prevention and then changed to Aggrenox. Blood work showed normal B12 levels, negative hepatitis serology, chest x-ray was negative. EKG did not show any ST abnormalities. She had some left leg pain. Lower extremity Doppler studies from 07/20/2014 were negative for DVT. She was discharged on 07/21/2014 to inpatient rehabilitation. She was discharged from rehabilitation on 08/02/2014. I reviewed hospital records as well as imaging tests. Her daughter called and asked for a sooner than scheduled appointment because of worsening weakness. However, she missed an appointment on 08/05/2014 because she was readmitted to the hospital on 08/04/2014 for slurring of speech and shaking spells. I reviewed the hospital records. She had an EEG, which was negative for seizure activity but she was felt to have partial onset seizures and was started on Keppra which is currently at 500 mg twice daily. She was changed from Aggrenox to Plavix 75 mg.   She had a brain MRI without contrast on 08/05/2014: 1. Persistent 1.1 cm focus of linear restricted diffusion within the lateral left thalamus near the posterior limb of the left internal capsule. Finding is favored to reflect resolving infarct from the 07/19/2014 study given the relative decreased signal intensity and size from that prior exam, although possible acute re-infarction of this area is not entirely excluded. 2. No other acute intracranial abnormality. In addition, I personally reviewed the images through the PACS system. She had an EEG on 08/06/2014: This was reported as normal in  the asleep and awake states. Her son called on 08/15/2014 to make an appointment.  I first met her on 01/19/2013, at which time I increased her Mysoline 250 mg strength 1-1/2 pills to 2 pills daily. I did advise her of the potential increase risk of side effects. She previously followed with Dr. Morene Antu. She has a billateral UE and head tremor for over 25 years. She was tried on primidone while she was in [place] with improvement in tremor. It was discontinued when she came to the [place] because she was on Coumadin. Her tremor affects her ability to feed herself, write, and put on her makeup and is worse with anxiety. Her daughter has a tremor as well. She was restarted on primidone by Dr. Erling Cruz in 3/12. She is no longer on coumadin. She was on coumadin for a history of DVT in her left leg following left foot injury. She has had numbness in her legs without pain. EMG/NCV 02/27/11 was normal except for some denervation in the left lumbosacral paraspinal muscles. B12 level was normal. She is on gabapentin 100 mg 2 at night  with relief of symptoms. Her major complaint is that of ongoing tremor, which is worse in the morning. She has not had side effects such as grogginess or difficulty walking with the primidone. She no longer cooks. Her tremor involves her writing and abilities to feed herself. She also continues to take gabapentin 13m 2 qHS for her neuropathy. She has not fallen per son. She was supposed to come back in 3 months but missed several appointments. She has been diagnosed with DM and has been started on metforim. She feels, her numbness and tingling is worse in her feet and she has now has started noticing burning sensation in her feet.    Her Past Medical History Is Significant For: Past Medical History  Diagnosis Date  . Hypertension   . High cholesterol   . [address]deep venous thrombosis) [address]   . Pneumonia   . Anxiety   . Essential tremor 10/27/2013  . Obesity, unspecified 10/27/2013   . [place]ly diagnosed diabetes [place] 10/27/2013  . Unspecified hereditary and idiopathic peripheral neuropathy 10/27/2013    Her Past Surgical History Is Significant For: Past Surgical History  Procedure Laterality Date  . Foot surgery    . Cataract extraction Bilateral     Her Family History Is Significant For: Family History  Problem Relation Age of Onset  . Coronary artery disease Other   . Stroke Mother   . Heart Problems Sister   . Heart Problems Brother     Her Social History Is Significant For: Social History   Social History  . Marital Status: Widowed    Spouse Name: N/A  . Number of Children: 5  . Years of Education: 9   Occupational History  .      does not work   Social History Main Topics  . Smoking status: Never Smoker   . Smokeless tobacco: Never Used  . Alcohol Use: No  . Drug Use: No  . Sexual Activity: No   Other Topics Concern  . None   Social History Narrative   Patient is right handed and resides with son    Her Allergies Are:  Allergies  Allergen Reactions  . Tape Rash    Please do not use Plastic Tape  :   Her Current Medications Are:  Outpatient Encounter Prescriptions as of 09/25/2015  Medication Sig  . ACCU-CHEK AVIVA PLUS test strip   . ACCU-CHEK SOFTCLIX LANCETS lancets   . ALPRAZolam (XANAX) 0.25 MG tablet Take 1 tablet (0.25 mg total) by mouth daily as needed for anxiety.  .Marland KitchenamLODipine (NORVASC) 2.5 MG tablet Take 2.5 mg by mouth daily.  .Marland Kitchenatorvastatin (LIPITOR) 20 MG tablet Take 20 mg by mouth every evening.   . clopidogrel (PLAVIX) 75 MG tablet TAKE 1 TABLET BY MOUTH EVERY DAY  . docusate sodium (COLACE) 100 MG capsule Take 1 capsule (100 mg total) by mouth 2 (two) times daily.  .Marland Kitchengabapentin (NEURONTIN) 100 MG capsule Take 1 capsule (100 mg total) by mouth 2 (two) times daily.  .Marland KitchenlevETIRAcetam (KEPPRA) 500 MG tablet Take 1 tablet (500 mg total) by mouth 2 (two) times daily.  . polyethylene glycol (MIRALAX / GLYCOLAX) packet  DISSOLVE 1 PACKAGE (17 G) IN 4 TO 8 OUNCES OF LIQUID AND DRINK 2 TIMES DAILY.  .Marland Kitchenprimidone (MYSOLINE) 250 MG tablet TAKE 1 TABLET (250 MG TOTAL) BY MOUTH 2 (TWO) TIMES DAILY.  .Marland Kitchenprimidone (MYSOLINE) 250 MG tablet Take 1 tablet (250 mg total) by mouth 2 (two) times  daily.  . RESTASIS 0.05 % ophthalmic emulsion   . Triamcinolone Acetonide (TRIAMCINOLONE 0.1 % CREAM : EUCERIN) CREA Apply 1 application topically 3 (three) times daily as needed for rash.   No facility-administered encounter medications on file as of 09/25/2015.  :  Review of Systems:  Out of a complete 14 point review of systems, all are reviewed and negative with the exception of these symptoms as listed below:   Review of Systems  Neurological:       Patient requested to come in today for f/u. She has to leave for [facility] tomorrow for family emergency.     Objective:  Neurologic Exam  Physical Exam Physical Examination:   Filed Vitals:   09/25/15 1507  BP: 112/60  Pulse: 70  Resp: 16   General Examination: The patient is a very pleasant 81 y.o. female in no acute distress. She appears well-developed and well-nourished and well groomed. She is is tearful at times.   HEENT: Normocephalic, atraumatic, pupils are equal, round and reactive to light and accommodation. Hearing is intact. Extraocular tracking is good without nystagmus. She has a moderate degree of head tremor, no-no type, stable. She has a slight degree of voice tremor. She is not dysarthric. Oropharynx is clear. She has mild airway crowding. Neck is otherwise supple with full range of motion.   Chest: Clear to auscultation without wheezing, rhonchi or crackles noted.  Heart: S1+S2+0, regular and normal without murmurs, rubs or gallops noted.   Abdomen: Soft, non-tender and non-distended with normal bowel sounds appreciated on auscultation.  Extremities: There is no pitting edema in the distal lower extremities bilaterally. Pedal pulses are  intact.  Skin: Warm and dry without trophic changes noted. There are no varicose veins.  Musculoskeletal: exam reveals no obvious joint deformities, tenderness or joint swelling or erythema.   Neurologically:  Mental status: The patient is awake, alert and oriented in all 4 spheres. His memory, attention, language and knowledge are appropriate. There is no aphasia, agnosia, apraxia or anomia. Speech is clear with normal prosody and enunciation. Thought process is linear. Mood is congruent and affect is normal.  Cranial nerves are as described above under HEENT exam. In addition, shoulder shrug is normal with equal shoulder height noted. Motor exam: Normal bulk, strength and tone is noted on the left side. On the right side she has minimal residual weakness, in her right grip and hip flexion primarily, stable from before. There is no drift, but she does have a slight resting tremor bilaterally. She has a mild to moderate bilateral upper extremity postural and action tremor. There is no rebound. Romberg was not tested. Reflexes are 1+ throughout. Fine motor skills are mildly impaired bilaterally in the upper and lower extremities. Cerebellar testing shows no dysmetria or intention tremor on finger to nose testing. Heel to shin is difficult for her. There is no truncal or gait ataxia.  Sensory exam: intact to light touch but decreased in the lower extremities bilaterally and mild decrease in temperature and pinprick sensation in her right arm.   Gait, station and balance: She stands up slowly with assistance and uses her 4-prong cane in the left hand. She walks slowly and has a very slight limp on the right side. She turns cautiously. Tandem walk is not possible, balance mildly impaired.   Assessment and Plan:   In summary, [name] [name] is a very pleasant 81 year old female with a history of obesity, previous DVT, ET, stroke as recent onset partial  seizures about 9 months ago, who presents for FU of  her ET, Stroke and Sz. She has done well and is still in outpatient therapy. She had a complete stroke workup in the past. She was readmitted for involuntary twitching and was placed on Keppra which she has been taking regularly and is tolerating at this time, 500 mg bid, without any recent seizure activity or auras. She has noted difficulty with her balance. Balance problems are more likely to be a combination of different issues including slight residual weakness from her stroke, aging, medication side effect and tremors.  She is no longer on diabetes medication. They check blood sugar values at home. They seem to be fine. Her neuropathy appears to be stable. She does not have much in the way of pain but does report some tingling. We reduced her gabapentin to 100 mg bid. Her primidone will stay the same at 250 mg twice daily and for now we will continue Keppra at 500 mg twice daily. If she remains free of seizure-like spells after a year we will do another EEG and consider tapering the Keppra, but I would not push for it too aggressively for fear of recurrence of Sz as she is at risk due to her prior stroke. Her residual weakness from her stroke is stable. I  suggested I see her back in  about 6 months, sooner if the need arises. I  did not need any refills today. For her plane trip she is reminded to get up and walk around and drink water every hour.   I spent 15 minutes in total face-to-face time with the patient, more than 50% of which was spent in counseling and coordination of care, reviewing test results, reviewing medication and discussing or reviewing the diagnosis of ET, stroke, sz, the prognosis and treatment options.

## 2015-09-25 NOTE — Patient Instructions (Signed)
We will continue with all your medications, including Keppra, Plavix, neurontin, and Mysoline at the same doses.   Walk around in the plane and drink water every hour.   Take care and do not worry.   [place] trip [place] and safe journey home.

## 2015-09-28 ENCOUNTER — Ambulatory Visit: Payer: Self-pay | Admitting: Neurology

## 2015-10-03 ENCOUNTER — Ambulatory Visit: Payer: Medicare Other | Admitting: Neurology

## 2015-11-02 ENCOUNTER — Other Ambulatory Visit: Payer: Self-pay | Admitting: Neurology

## 2015-11-02 DIAGNOSIS — E785 Hyperlipidemia, unspecified: Secondary | ICD-10-CM

## 2015-12-24 IMAGING — CR DG CHEST 2V
1 series · 1 of 1 positions shown · non-contrast
Comparison: March 17, 2013.

CLINICAL DATA: Dyspnea.

EXAM:
CHEST  2 VIEW

[w chest lat]
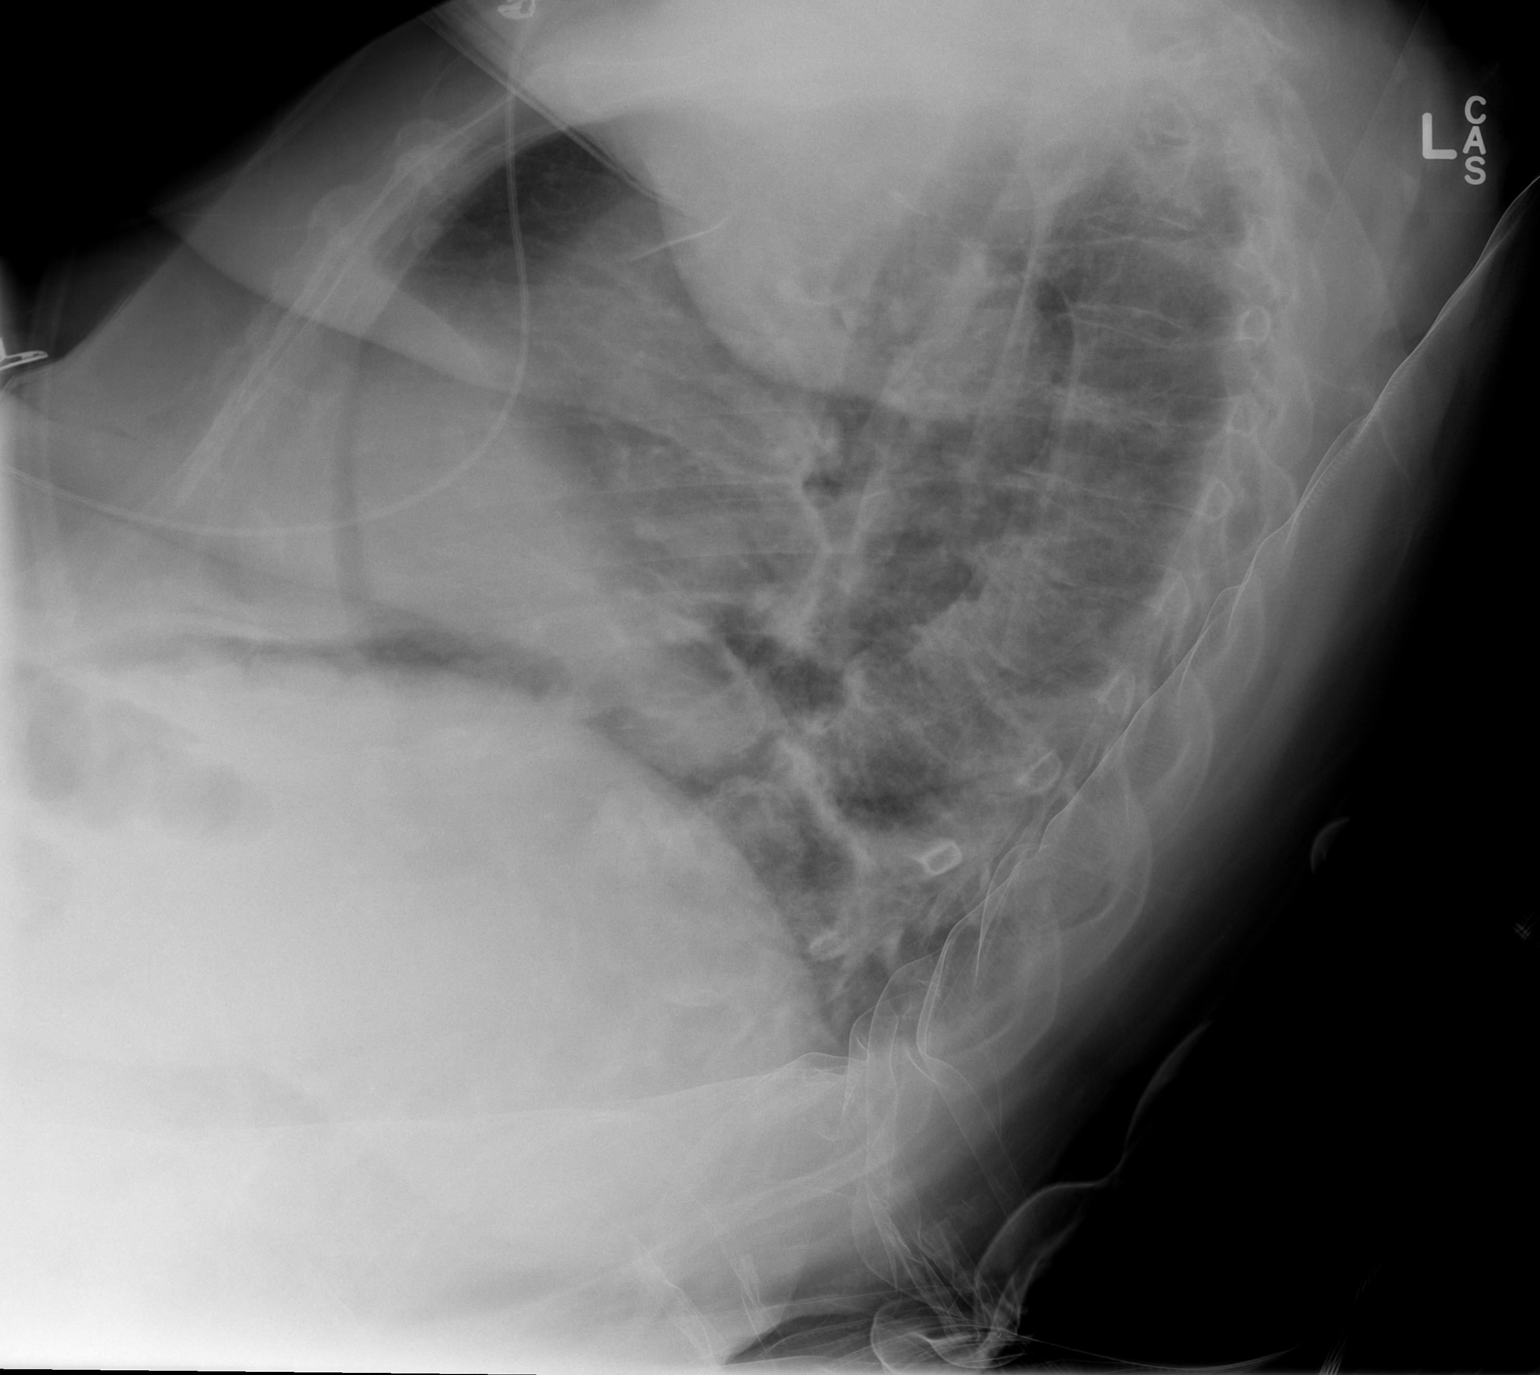

[1 of 1 positions shown; findings below may reference images not displayed]

FINDINGS: Stable cardiomediastinal silhouette. No pneumothorax or pleural
effusion is noted. Degenerative disc disease is noted in the lower
thoracic spine. No acute pulmonary disease is noted.
IMPRESSION: No acute cardiopulmonary abnormality seen.

## 2015-12-26 IMAGING — CR DG FOOT COMPLETE 3+V*L*
3 series · 3 of 3 positions shown · non-contrast
Comparison: None.

CLINICAL DATA: Left leg pain

EXAM:
LEFT FOOT - COMPLETE 3+ VIEW

[x foot ap left]
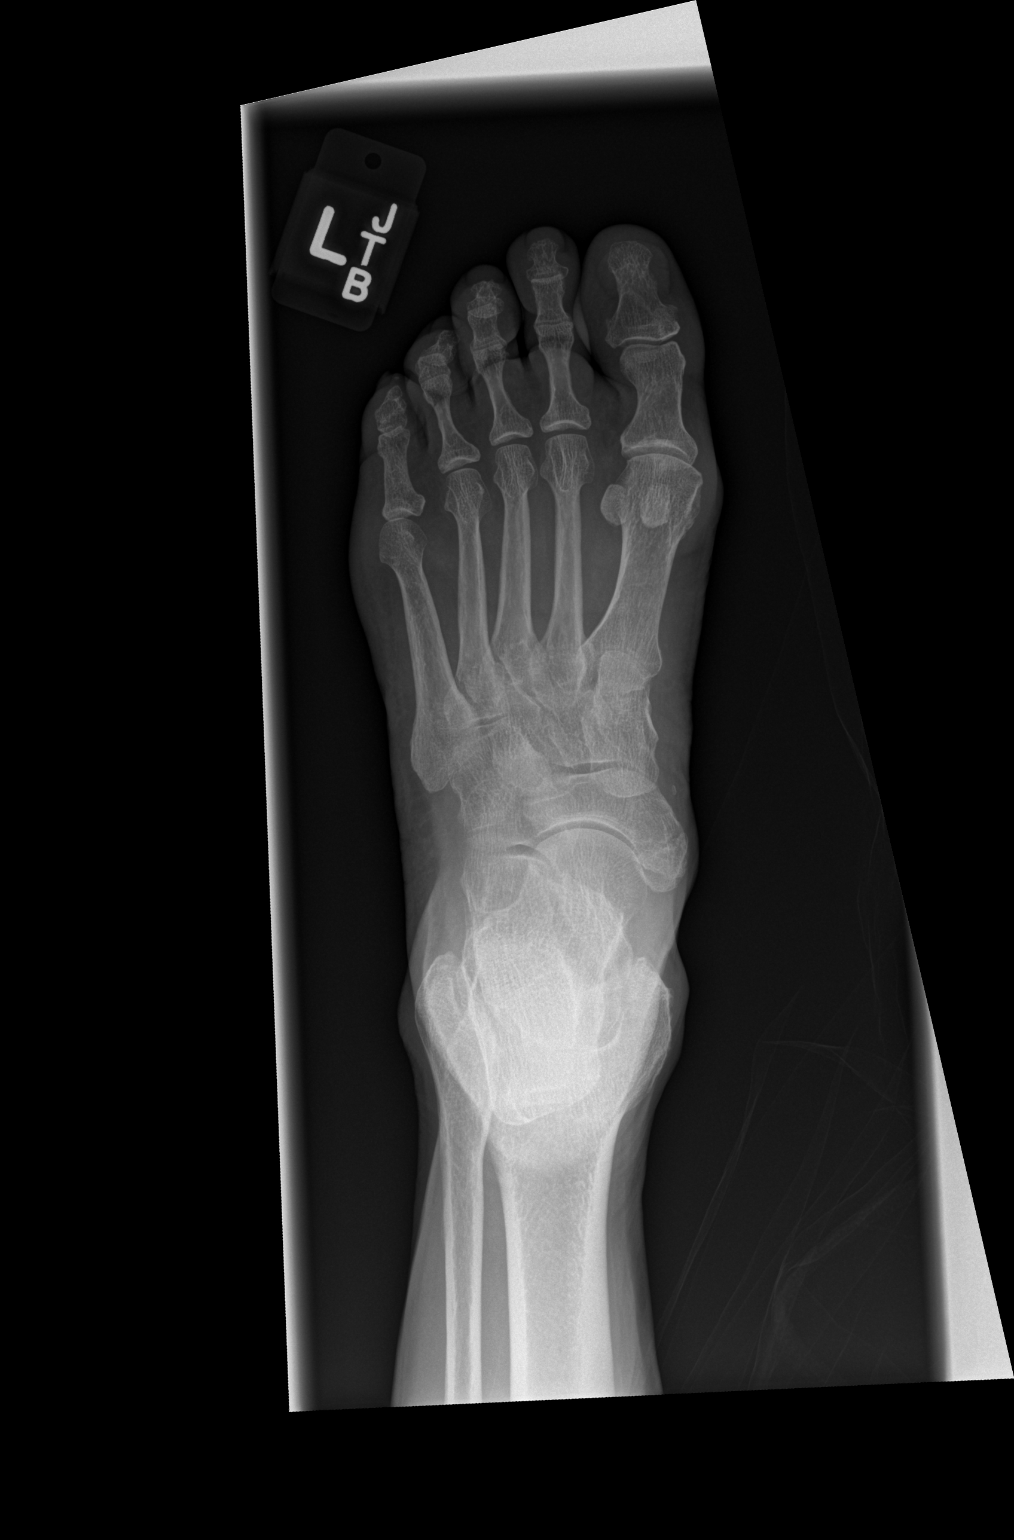

[x foot obl left]
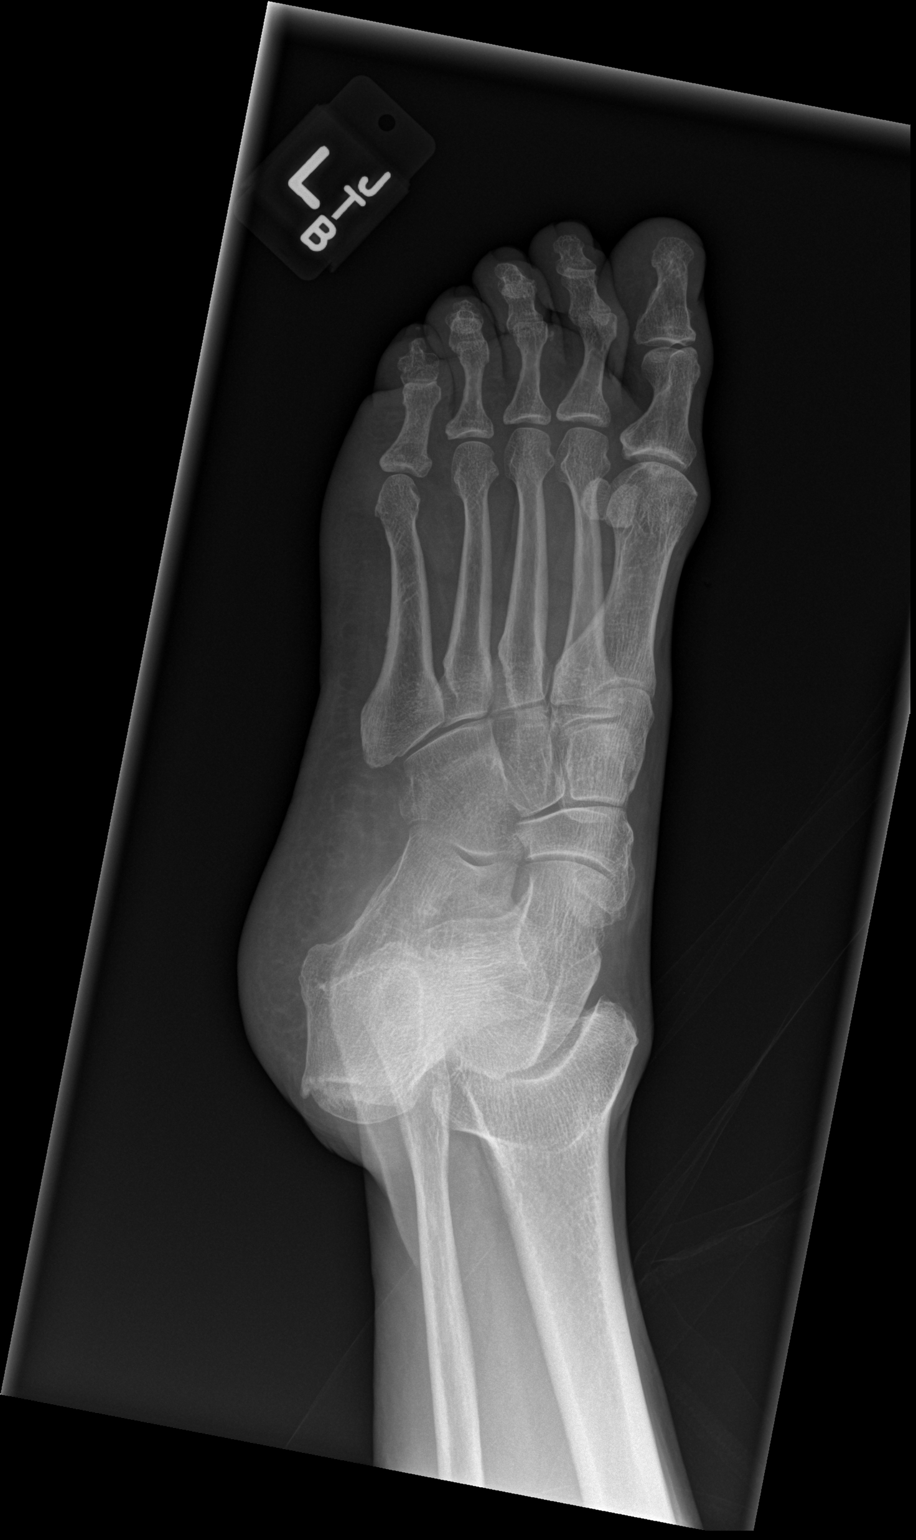

[x foot lat left]
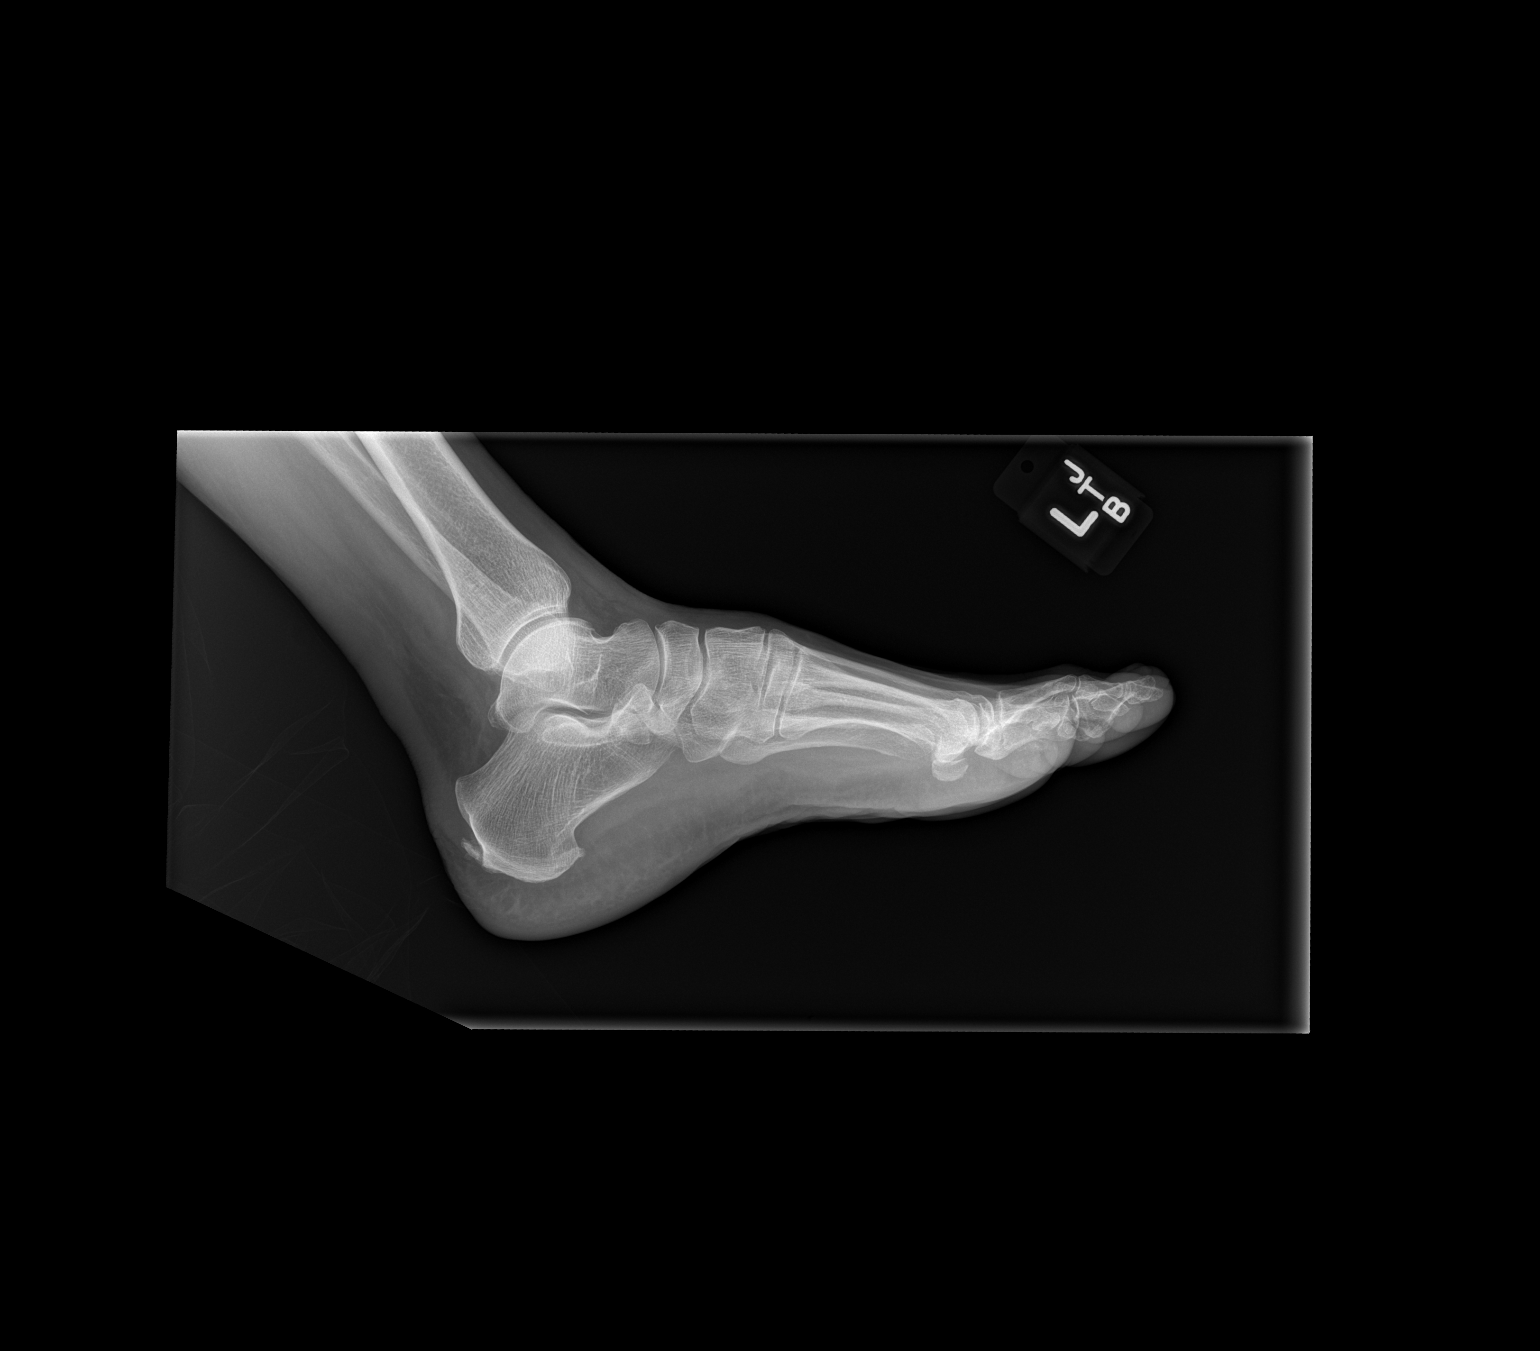

[3 of 3 positions shown; findings below may reference images not displayed]

FINDINGS: There is no evidence of fracture or dislocation. There is
calcification at the insertion of Achilles tendon to the calcaneus.
Soft tissues are unremarkable.
IMPRESSION: No acute fracture or dislocation.

## 2015-12-26 IMAGING — CR DG HIP (WITH OR WITHOUT PELVIS) 2-3V*L*
3 series · 3 of 3 positions shown · non-contrast
Comparison: None.

CLINICAL DATA: Left leg pain

EXAM:
LEFT HIP - COMPLETE 2+ VIEW

[t hip ap left]
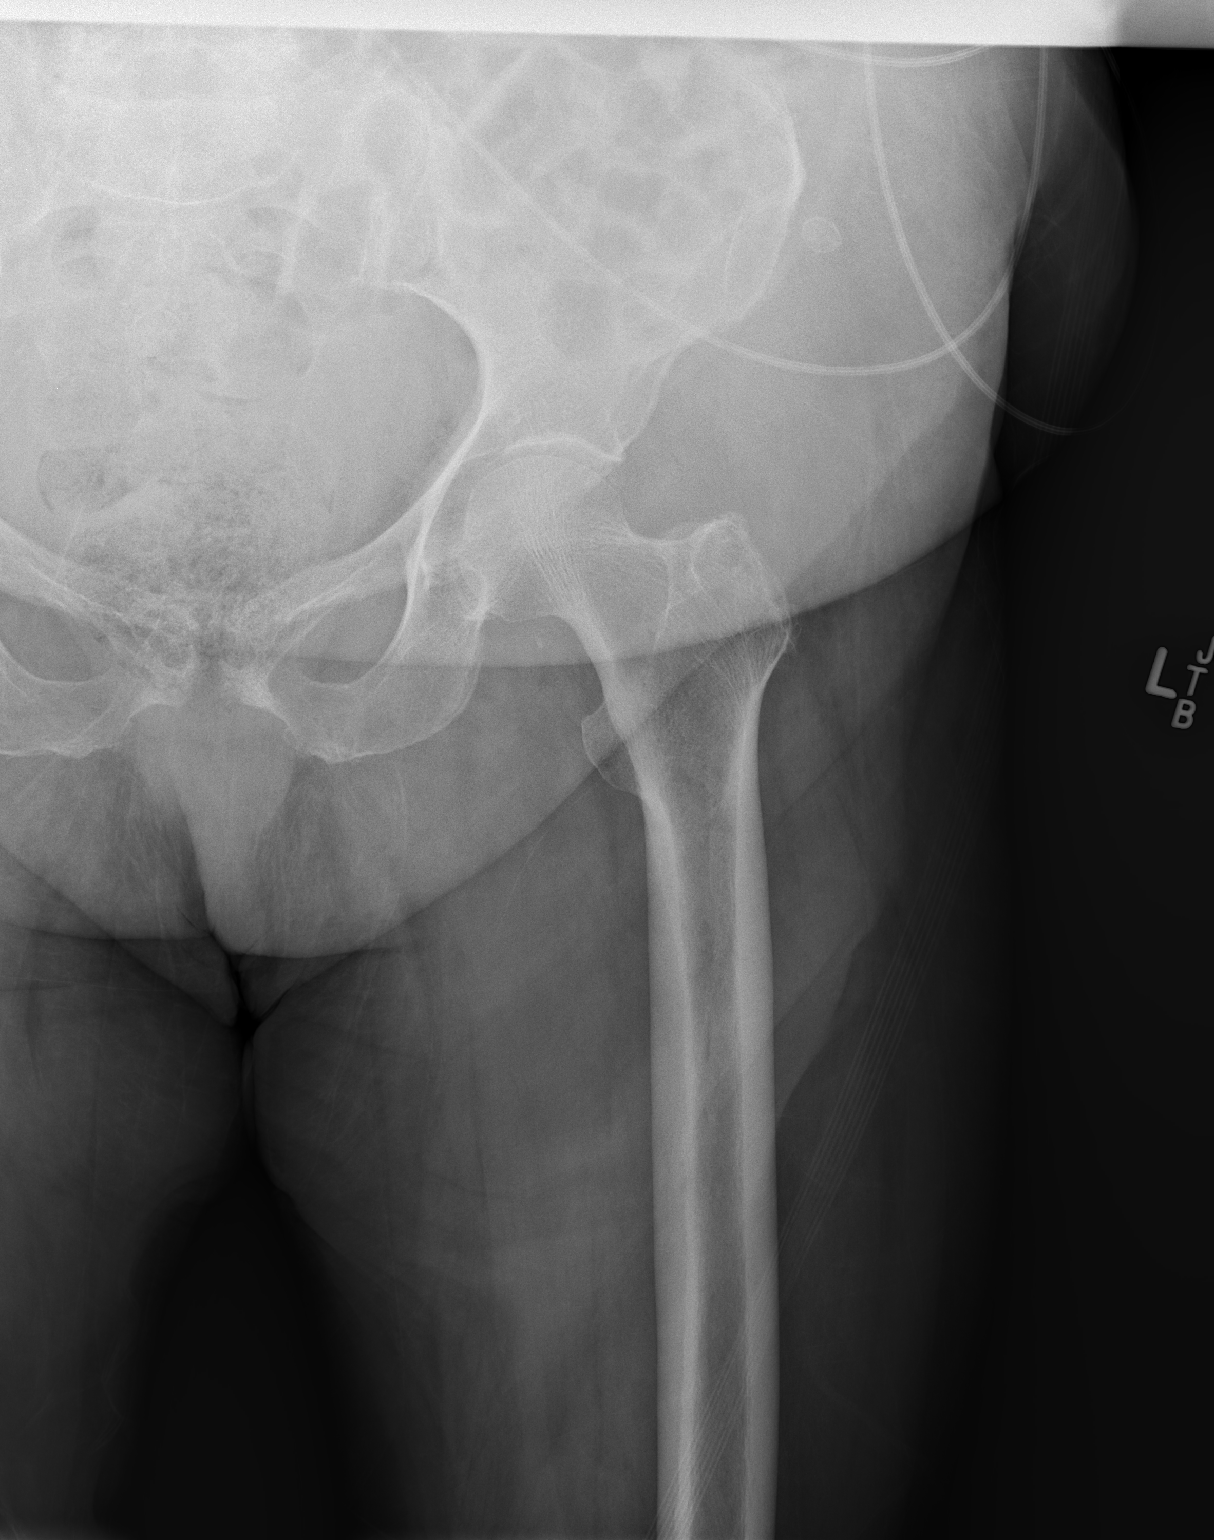

[t hip frog leg left]
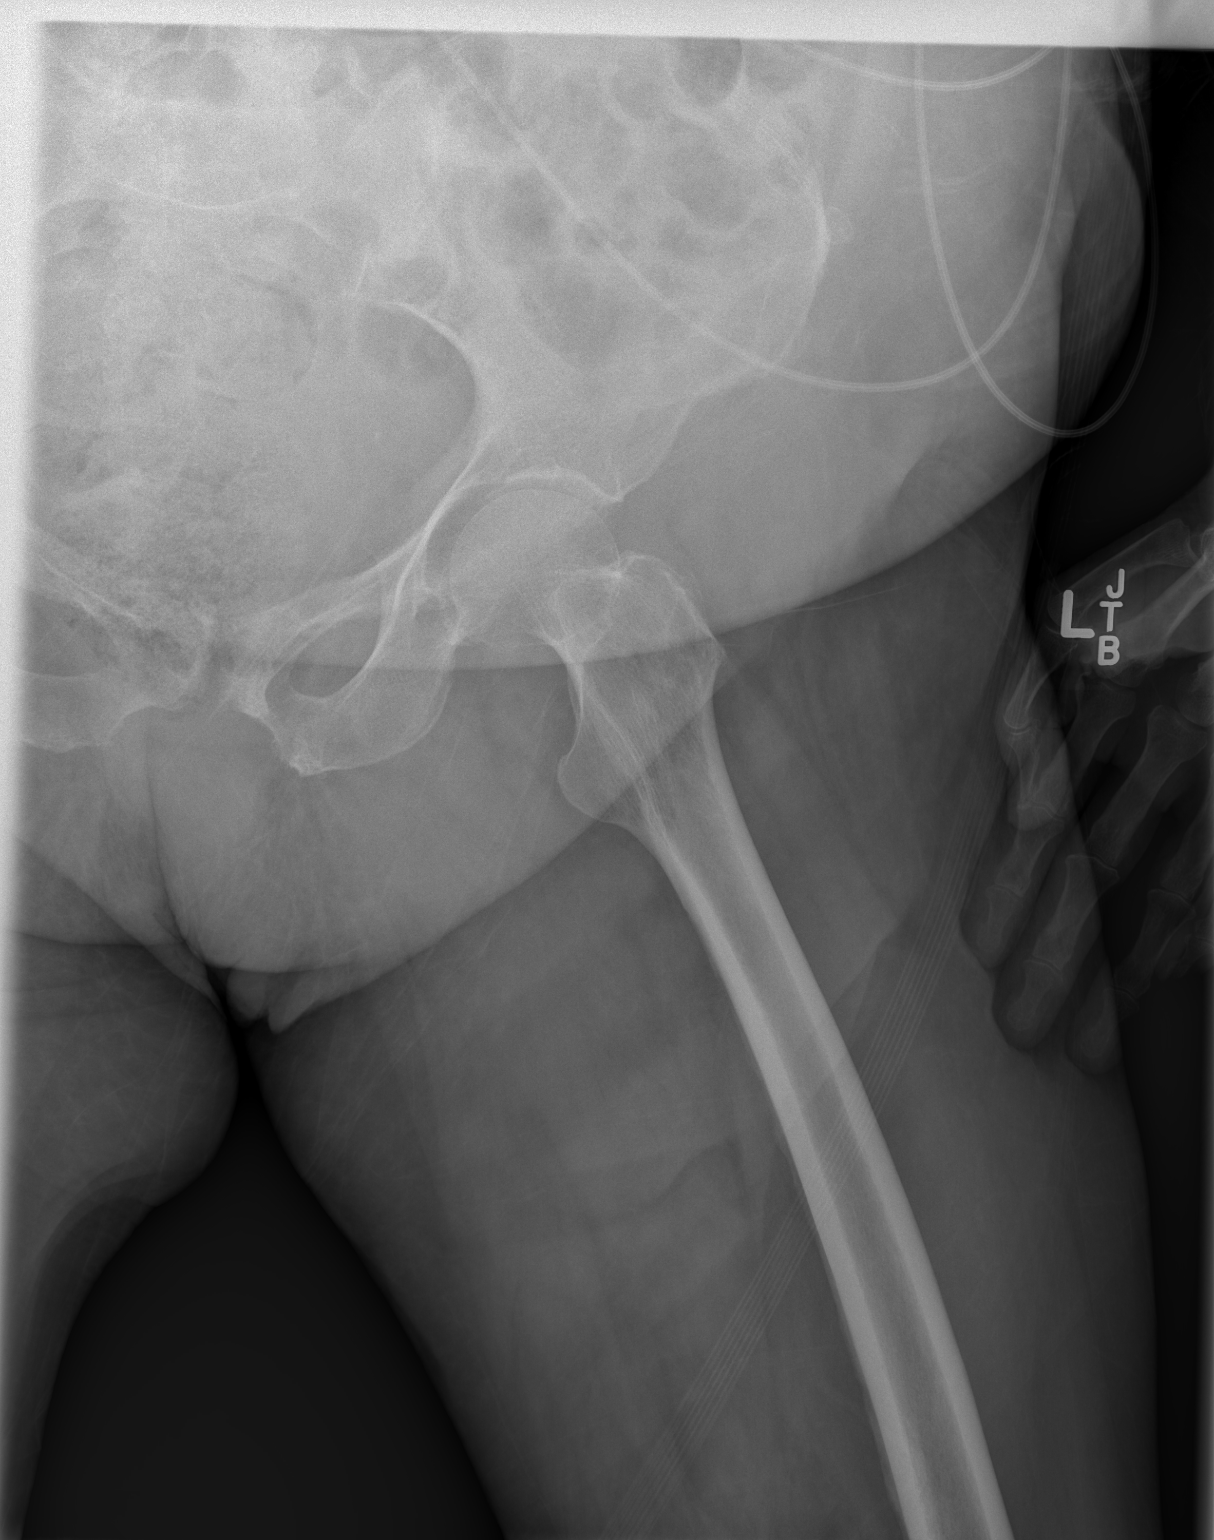

[t pelvis ap]
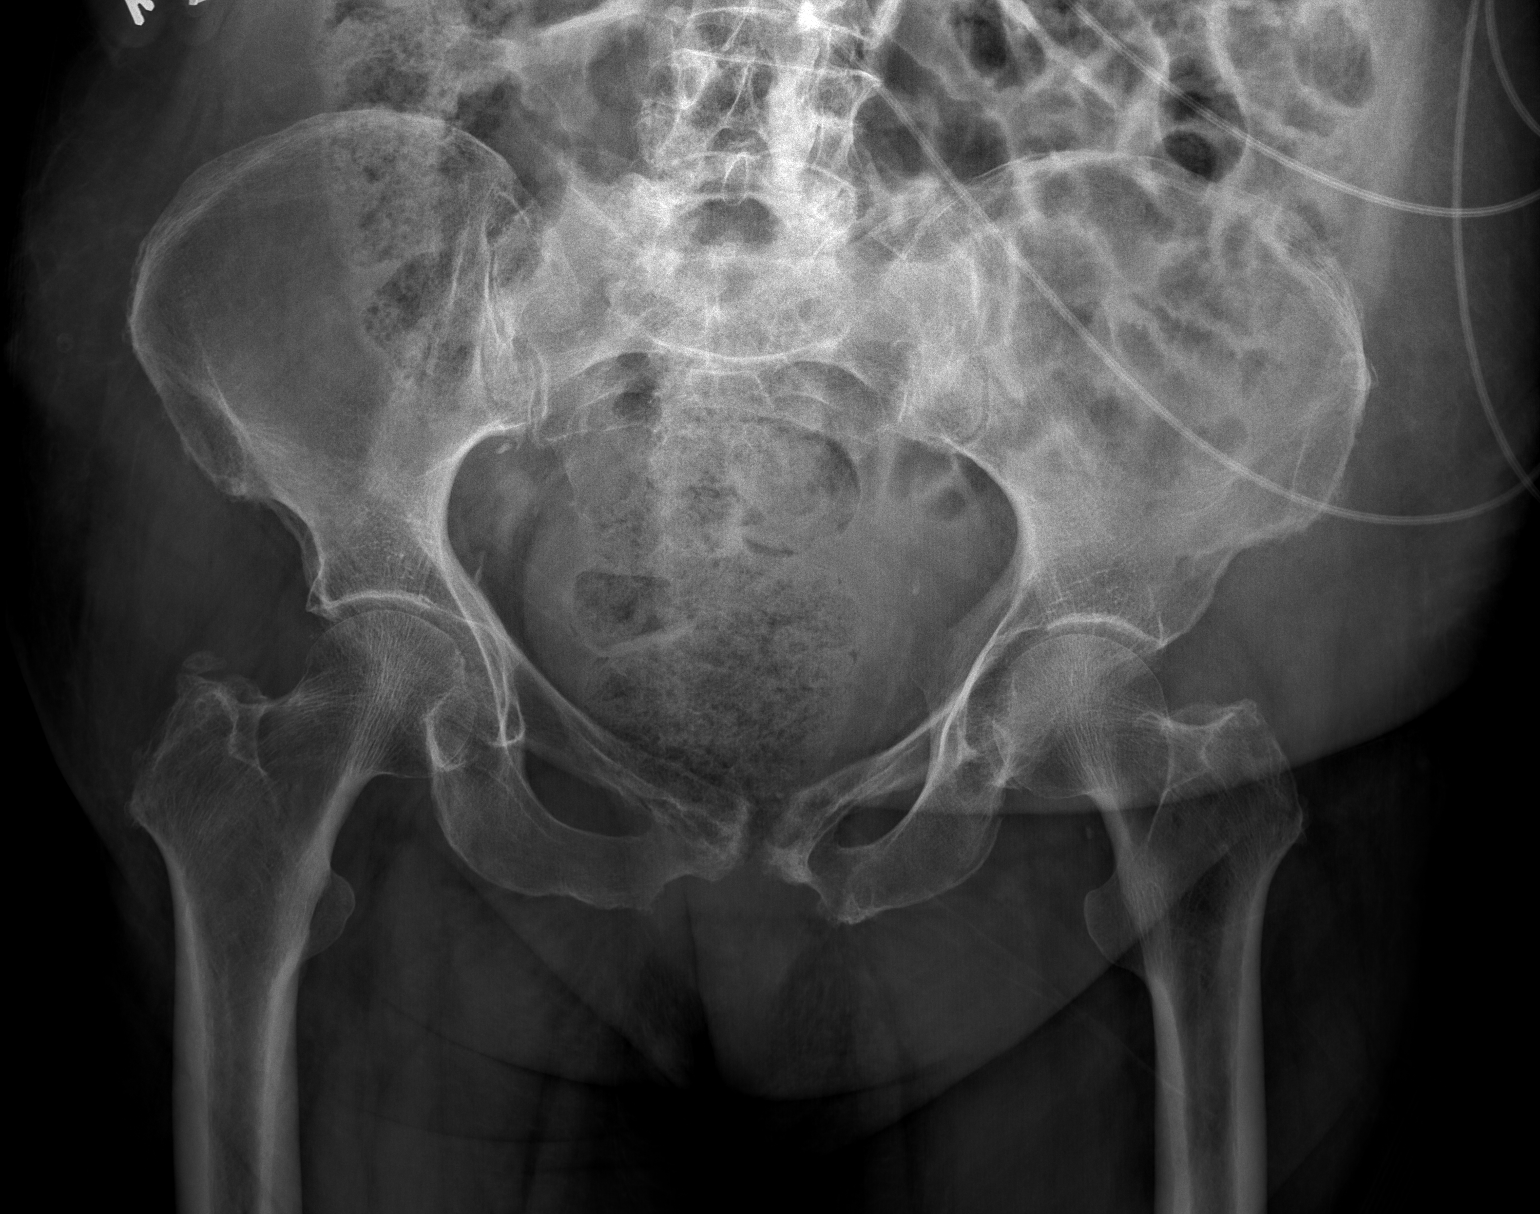

[3 of 3 positions shown; findings below may reference images not displayed]

FINDINGS: There is no evidence of hip fracture or dislocation. There is mild
narrow bilateral hip joint spaces. Osteitis pubis is noted.
IMPRESSION: No acute fracture or dislocation.

## 2016-03-25 ENCOUNTER — Ambulatory Visit: Payer: Medicare Other | Admitting: Neurology

## 2016-05-02 ENCOUNTER — Telehealth: Payer: Self-pay | Admitting: Neurology

## 2016-05-02 DIAGNOSIS — G40109 Localization-related (focal) (partial) symptomatic epilepsy and epileptic syndromes with simple partial seizures, not intractable, without status epilepticus: Secondary | ICD-10-CM

## 2016-05-02 MED ORDER — LEVETIRACETAM 500 MG PO TABS: 500.0000 mg | ORAL_TABLET | Freq: Two times a day (BID) | ORAL | Status: DC

## 2016-05-02 NOTE — Telephone Encounter (Signed)
[facility] [address] [place] [id number] [phone] called to advise, patient is in the area visiting and is out of levETIRAcetam (KEPPRA) 500 MG tablet, has been out of medication for a couple of days, requests refill of this medication.

## 2016-05-02 NOTE — Telephone Encounter (Signed)
30 day refill sent in to requested pharmacy due to patient cancelling 3 prior appts. Notice to pharmacy sent that patient must keep up coming appt.

## 2016-05-04 ENCOUNTER — Encounter (HOSPITAL_COMMUNITY): Payer: Self-pay | Admitting: Emergency Medicine

## 2016-05-04 ENCOUNTER — Emergency Department (HOSPITAL_COMMUNITY)
Admission: EM | Admit: 2016-05-04 | Discharge: 2016-05-04 | Disposition: A | Discharge status: Home / Self Care | Payer: Medicare Other | Attending: Emergency Medicine | Admitting: Emergency Medicine

## 2016-05-04 DIAGNOSIS — K5900 Constipation, unspecified: Secondary | ICD-10-CM | POA: Diagnosis not present

## 2016-05-04 DIAGNOSIS — Z79899 Other long term (current) drug therapy: Secondary | ICD-10-CM | POA: Insufficient documentation

## 2016-05-04 DIAGNOSIS — E1140 Type 2 diabetes mellitus with diabetic neuropathy, unspecified: Secondary | ICD-10-CM | POA: Insufficient documentation

## 2016-05-04 DIAGNOSIS — M545 Low back pain: Secondary | ICD-10-CM | POA: Diagnosis present

## 2016-05-04 DIAGNOSIS — Z7901 Long term (current) use of anticoagulants: Secondary | ICD-10-CM | POA: Insufficient documentation

## 2016-05-04 DIAGNOSIS — I10 Essential (primary) hypertension: Secondary | ICD-10-CM | POA: Diagnosis not present

## 2016-05-04 MED ORDER — KETOROLAC TROMETHAMINE 15 MG/ML IJ SOLN
7.5000 mg | Freq: Once | INTRAMUSCULAR | Status: AC
  Administered 2016-05-04: 7.5 mg via INTRAMUSCULAR
  Filled 2016-05-04: qty 1

## 2016-05-04 MED ORDER — HYDROMORPHONE HCL 1 MG/ML IJ SOLN
0.5000 mg | Freq: Once | INTRAMUSCULAR | Status: AC
  Administered 2016-05-04: 0.5 mg via INTRAMUSCULAR
  Filled 2016-05-04: qty 1

## 2016-05-04 MED ORDER — DEXAMETHASONE 4 MG PO TABS
8.0000 mg | ORAL_TABLET | Freq: Once | ORAL | Status: AC
  Administered 2016-05-04: 8 mg via ORAL
  Filled 2016-05-04: qty 2

## 2016-05-04 MED ORDER — POLYETHYLENE GLYCOL 3350 17 G PO PACK: 17.0000 g | PACK | Freq: Two times a day (BID) | ORAL | Status: DC | PRN

## 2016-05-04 MED ORDER — MELOXICAM 7.5 MG PO TABS: 7.5000 mg | ORAL_TABLET | Freq: Every day | ORAL | Status: DC | PRN

## 2016-05-04 NOTE — ED Notes (Addendum)
Last Friday moving from couch to standing and felt a pain in left lower back. [place], states imaging was negative. Has taken Tylenol and tried heating pad for pain, but there is no relief. Very painful to ambulate, but able to bear weight. States the pain is in the left lower back and does not radiate.

## 2016-05-04 NOTE — ED Provider Notes (Signed)
CSN: [id number]     Arrival date & time 05/04/16  1037 History   First MD Initiated Contact with Patient 05/04/16 1054     Chief Complaint  Patient presents with  . Back Pain     (Consider location/radiation/quality/duration/timing/severity/associated sxs/prior Treatment) HPI   82 year old female with left lower back pain. Approximately one week ago, the patient was getting up from a seated position on a couch. As she stood up she had an acute onset severe pain in this area. It has been persistent since then. Worse with certain movements but ambulation specifically doesn't seem to really exacerbate it. Pain is constant. It does not radiate. No associated numbness, tingling or focal loss of strength. No urinary complaints. No fever sweats or chills. Denies any past history of any prior back or seizures. She is on Plavix. She was initially evaluated at a facility in [place]. She reports imaging but is not sure of the exact modality. Results were "okay" per family's report. She has been taking Tylenol since then with no significant improvement. She is also complaining of not having a bowel movement in the last 3 days.  Past Medical History  Diagnosis Date  . Hypertension   . High cholesterol   . DVT (deep venous thrombosis) (HCC)   . Pneumonia   . Anxiety   . Essential tremor 10/27/2013  . Obesity, unspecified 10/27/2013  . Newly diagnosed diabetes (HCC) 10/27/2013  . Unspecified hereditary and idiopathic peripheral neuropathy 10/27/2013   Past Surgical History  Procedure Laterality Date  . Foot surgery    . Cataract extraction Bilateral    Family History  Problem Relation Age of Onset  . Coronary artery disease Other   . Stroke Mother   . Heart Problems Sister   . Heart Problems Brother    Social History  Substance Use Topics  . Smoking status: Never Smoker   . Smokeless tobacco: Never Used  . Alcohol Use: No   OB History    No data available     Review of Systems  All  systems reviewed and negative, other than as noted in HPI.   Allergies  Tape  Home Medications   Prior to Admission medications   Medication Sig Start Date End Date Taking? Authorizing Provider  acetaminophen (TYLENOL) 500 MG tablet Take 500 mg by mouth every 6 (six) hours as needed (pain).   Yes Historical Provider, MD  amLODipine (NORVASC) 2.5 MG tablet Take 2.5 mg by mouth daily. 02/13/15  Yes Historical Provider, MD  atorvastatin (LIPITOR) 20 MG tablet Take 20 mg by mouth every evening.    Yes Historical Provider, MD  Cholecalciferol (VITAMIN D PO) Take 1 tablet by mouth daily.   Yes Historical Provider, MD  clopidogrel (PLAVIX) 75 MG tablet TAKE 1 TABLET BY MOUTH EVERY DAY 11/02/15  Yes Huston Foley, MD  gabapentin (NEURONTIN) 100 MG capsule Take 1 capsule (100 mg total) by mouth 2 (two) times daily. 04/26/15  Yes Huston Foley, MD  levETIRAcetam (KEPPRA) 500 MG tablet Take 1 tablet (500 mg total) by mouth 2 (two) times daily. 05/02/16  Yes Huston Foley, MD  primidone (MYSOLINE) 250 MG tablet TAKE 1 TABLET (250 MG TOTAL) BY MOUTH 2 (TWO) TIMES DAILY. 11/24/14  Yes Huston Foley, MD  ACCU-CHEK AVIVA PLUS test strip  11/03/14   Historical Provider, MD  ACCU-CHEK SOFTCLIX LANCETS lancets  08/17/14   Historical Provider, MD  ALPRAZolam Prudy Feeler) 0.25 MG tablet Take 1 tablet (0.25 mg total) by mouth daily  as needed for anxiety. Patient not taking: Reported on 05/04/2016 08/10/14   Marinda ElkAbraham Feliz Ortiz, MD  docusate sodium (COLACE) 100 MG capsule Take 1 capsule (100 mg total) by mouth 2 (two) times daily. 03/17/13   Jennifer Piepenbrink, PA-C  polyethylene glycol (MIRALAX / GLYCOLAX) packet DISSOLVE 1 PACKAGE (17 G) IN 4 TO 8 OUNCES OF LIQUID AND DRINK 2 TIMES DAILY. 12/27/14   Erick ColaceAndrew E Kirsteins, MD  primidone (MYSOLINE) 250 MG tablet Take 1 tablet (250 mg total) by mouth 2 (two) times daily. Patient not taking: Reported on 05/04/2016 04/26/15   Huston FoleySaima Athar, MD  RESTASIS 0.05 % ophthalmic emulsion  09/29/14    Historical Provider, MD  Triamcinolone Acetonide (TRIAMCINOLONE 0.1 % CREAM : EUCERIN) CREA Apply 1 application topically 3 (three) times daily as needed for rash. 07/29/14   Evlyn KannerPamela S Love, PA-C   BP 144/71 mmHg  Pulse 88  Temp(Src) 98.3 F (36.8 C) (Oral)  Resp 18  SpO2 94% Physical Exam  Constitutional: She appears well-developed and well-nourished. No distress.  HENT:  Head: Normocephalic and atraumatic.  Eyes: Conjunctivae are normal. Right eye exhibits no discharge. Left eye exhibits no discharge.  Neck: Neck supple.  Cardiovascular: Normal rate, regular rhythm and normal heart sounds.  Exam reveals no gallop and no friction rub.   No murmur heard. Pulmonary/Chest: Effort normal and breath sounds normal. No respiratory distress.  Abdominal: Soft. She exhibits no distension. There is no tenderness.  Musculoskeletal: She exhibits no edema or tenderness.  Tenderness to palpation to the left SI joint. No midline spinal tenderness. Overlying skin changes. Increased pain in this area with hip flexion. Neurovascular intact distally.  Neurological: She is alert.  Skin: Skin is warm and dry.  Psychiatric: She has a normal mood and affect. Her behavior is normal. Thought content normal.  Nursing note and vitals reviewed.   ED Course  Procedures (including critical care time) Labs Review Labs Reviewed - No data to display  Imaging Review No results found. I have personally reviewed and evaluated these images and lab results as part of my medical decision-making.   EKG Interpretation None      MDM   Final diagnoses:  Low back pain without sciatica, unspecified back pain laterality  Constipation, unspecified constipation type    82 year old female with lower back pain without concerning features. Plan symptomatic treatment. Very low suspicion for UTI, various etiologies which can cause cord compression, AAA, etc.    Raeford RazorStephen Britany Callicott, MD 05/04/16 1223

## 2016-05-04 NOTE — Discharge Instructions (Signed)

## 2016-05-06 ENCOUNTER — Encounter (HOSPITAL_COMMUNITY)
Admission: RE | Admit: 2016-05-06 | Discharge: 2016-05-06 | Disposition: A | Discharge status: Home / Self Care | Payer: Medicare Other | Source: Ambulatory Visit | Attending: Rheumatology | Admitting: Rheumatology

## 2016-05-06 ENCOUNTER — Other Ambulatory Visit (HOSPITAL_COMMUNITY): Payer: Self-pay | Admitting: Rheumatology

## 2016-05-06 DIAGNOSIS — R102 Pelvic and perineal pain: Secondary | ICD-10-CM

## 2016-05-06 DIAGNOSIS — M545 Low back pain, unspecified: Secondary | ICD-10-CM

## 2016-05-06 MED ORDER — TECHNETIUM TC 99M MEDRONATE IV KIT
27.4000 | PACK | Freq: Once | INTRAVENOUS | Status: AC | PRN
  Administered 2016-05-06: 27.4 via INTRAVENOUS

## 2016-05-08 ENCOUNTER — Ambulatory Visit (INDEPENDENT_AMBULATORY_CARE_PROVIDER_SITE_OTHER): Payer: Medicare Other | Admitting: Neurology

## 2016-05-08 ENCOUNTER — Encounter: Payer: Self-pay | Admitting: Neurology

## 2016-05-08 VITALS — BP 142/68 | HR 92 | Resp 16 | Ht <= 58 in | Wt 153.0 lb

## 2016-05-08 DIAGNOSIS — G250 Essential tremor: Secondary | ICD-10-CM

## 2016-05-08 DIAGNOSIS — E785 Hyperlipidemia, unspecified: Secondary | ICD-10-CM

## 2016-05-08 DIAGNOSIS — M6289 Other specified disorders of muscle: Secondary | ICD-10-CM

## 2016-05-08 DIAGNOSIS — R531 Weakness: Secondary | ICD-10-CM

## 2016-05-08 DIAGNOSIS — G40109 Localization-related (focal) (partial) symptomatic epilepsy and epileptic syndromes with simple partial seizures, not intractable, without status epilepticus: Secondary | ICD-10-CM | POA: Diagnosis not present

## 2016-05-08 DIAGNOSIS — E0842 Diabetes mellitus due to underlying condition with diabetic polyneuropathy: Secondary | ICD-10-CM

## 2016-05-08 DIAGNOSIS — Z9181 History of falling: Secondary | ICD-10-CM | POA: Diagnosis not present

## 2016-05-08 MED ORDER — PRIMIDONE 250 MG PO TABS: ORAL_TABLET | ORAL | Status: DC

## 2016-05-08 MED ORDER — CLOPIDOGREL BISULFATE 75 MG PO TABS: 75.0000 mg | ORAL_TABLET | Freq: Every day | ORAL | Status: DC

## 2016-05-08 MED ORDER — GABAPENTIN 100 MG PO CAPS: 100.0000 mg | ORAL_CAPSULE | Freq: Two times a day (BID) | ORAL | Status: DC

## 2016-05-08 MED ORDER — LEVETIRACETAM 500 MG PO TABS: 500.0000 mg | ORAL_TABLET | Freq: Two times a day (BID) | ORAL | Status: DC

## 2016-05-08 NOTE — Patient Instructions (Addendum)
You can try a microwavable heatpad for your lower back discomfort. Avoid falling asleep with it! Do not use an electric heat pad or blanket due to burn risk! You can use the meloxicam as needed.  We will keep your medications the same.  You have muscle soreness most likely in the lower back.

## 2016-05-08 NOTE — Progress Notes (Signed)
Subjective:    Patient ID: [name] [name] is a 82 y.o. female.  HPI     Interim history:   [name] [name] is a very pleasant 82 year old Middle Eastern/[place]ian lady with an underlying medical history of hypertension, hyperlipidemia, DVT, pneumonia, anxiety, obesity, diabetes, peripheral neuropathy, stroke and essential tremor, who presents for follow-up consultation of her essential tremor and her history of stroke. She is accompanied by her son again today. I last saw her on 09/25/15, at which time she felt she was doing well. She was worried about her brother in [place]. She was not able to travel to [place] and was sad about it. She was planning to fly to [place] to be with her daughter and other family for some time. No recent seizures or strokelike symptoms were reported. Tremor was stable. She was using a cane and had no recent falls. I suggested we continue with Keppra, Plavix, gabapentin and Mysoline at the same doses.  Today, 05/08/2016: She reports doing okay, but shaking is more. She fell about 10 days ago, was visiting [place], who is expecting a baby, and patient fell and landed on the back, since then has L sided back pain. Went to ER on 05/04/16 and was given injection with decadron, toradol, dilaudid, not much help, had bone scan on the 05/06/16, which I reviewed with them: IMPRESSION: 1. No evidence of a recent fracture. 2. No evidence of neoplastic disease to bone. 3. Significant degenerative uptake noted along spine as well as involving multiple bilateral joints.   She feels that her tremor has become worse but also endorses more stress and pain. She also feels more heaviness in her right leg but again she is just not feeling very well and worries about her low back pain.  Previously:    I saw her on 04/26/2015 at which time she reported difficulty with her balance. Her tremor had become a little worse. She had had no recent seizures or auras. She was able to tolerate Keppra 500 mg  twice daily. She was on the same dose of Mysoline for her tremors. She had benefited from physical therapy at [facility] and requested to go back for outpatient physical therapy. She had not fallen. She was using a 4 pronged cane and was no longer using her walker.  I saw her on 11/16/2014, at which time her son reported that she was doing well. She was in outpatient physical therapy and was using a 2 wheeled walker. She was taking gabapentin at a lower dose. Lowering gabapentin had improved her heavy headedness in the morning. Her tremor was stable. She was tolerating Keppra 500 mg twice daily with no recent seizures or auras reported. Overall she has done well. I continued her on the same dose of primidone, the same dose of Keppra, and suggested we reduce her gabapentin further to 100 mg in the morning and 200 mg in the evening.   Of note, they canceled an appointment for 03/17/2015.  I saw her on 08/19/2014 at which time she presented after her hospitalization for stroke. Her son reported that Aggrenox was causing her headaches and she was switched to Plavix. We talked about sleep apnea screening. She had no witnessed apneas and only mild snoring. She had done fairly well. They requested a referral to outpatient therapy in [facility]. She was placed on Keppra for episodic twitching. She had had some medication changes when she was hospitalized for her stroke. She was advised to use her 2  wheeled walker with assistance. I referred her to physical therapy outpatient. We talked about secondary stroke prevention. I suggested she reduce gabapentin to 200 mg twice daily down from 300 mg twice daily because of dizziness and head heaviness reported. This continued on Mysoline for her essential tremor.  I saw her on 10/27/2013 at which time she presented for followup of her long-standing history of essential tremor. I felt she was stable in that regard and felt that her diabetes had resulted in diabetic  neuropathy. I asked her to continue with Mysoline at the same strength and increased her gabapentin to 3 pills at night for a total of 300 mg each bedtime.   In the interim she presented to the emergency room on 07/18/2014 with new onset right-sided weakness, facial droop, slurring of speech which started around 6 PM the night before. She was in [place], [place] visiting family when she had acute onset of the symptoms. She was seen in [place] at the hospital and CT head was negative for bleed but she did not receive TPA because of low platelet count of 90,000. She had improvement of her symptoms but persistent deficits. She was admitted to the hospital and had a complete stroke workup. She had an MRI of the brain as well as MRA head and was found to have a left thalamic ischemic stroke. Neurology was consulted. MRA showed left PCA occlusion. Echocardiogram showed an EF of 60-65% with no wall motion abnormalities and no source of emboli. Carotid Doppler studies were negative for any significant ICA stenosis (1-39%). Hb A1c was 6. LDL was 62. She was placed on aspirin for secondary stroke prevention and then changed to Aggrenox. Blood work showed normal B12 levels, negative hepatitis serology, chest x-ray was negative. EKG did not show any ST abnormalities. She had some left leg pain. Lower extremity Doppler studies from 07/20/2014 were negative for DVT. She was discharged on 07/21/2014 to inpatient rehabilitation. She was discharged from rehabilitation on 08/02/2014. I reviewed hospital records as well as imaging tests. Her daughter called and asked for a sooner than scheduled appointment because of worsening weakness. However, she missed an appointment on 08/05/2014 because she was readmitted to the hospital on 08/04/2014 for slurring of speech and shaking spells. I reviewed the hospital records. She had an EEG, which was negative for seizure activity but she was felt to have partial onset seizures  and was started on Keppra which is currently at 500 mg twice daily. She was changed from Aggrenox to Plavix 75 mg.   She had a brain MRI without contrast on 08/05/2014: 1. Persistent 1.1 cm focus of linear restricted diffusion within the lateral left thalamus near the posterior limb of the left internal capsule. Finding is favored to reflect resolving infarct from the 07/19/2014 study given the relative decreased signal intensity and size from that prior exam, although possible acute re-infarction of this area is not entirely excluded. 2. No other acute intracranial abnormality. In addition, I personally reviewed the images through the PACS system. She had an EEG on 08/06/2014: This was reported as normal in the asleep and awake states. Her son called on 08/15/2014 to make an appointment.  I first met her on 01/19/2013, at which time I increased her Mysoline 250 mg strength 1-1/2 pills to 2 pills daily. I did advise her of the potential increase risk of side effects. She previously followed with Dr. Morene Antu. She has a billateral UE and head tremor for over 25 years. She  was tried on primidone while she was in [place] with improvement in tremor. It was discontinued when she came to the [place] because she was on Coumadin. Her tremor affects her ability to feed herself, write, and put on her makeup and is worse with anxiety. Her daughter has a tremor as well. She was restarted on primidone by Dr. Erling Cruz in 3/12. She is no longer on coumadin. She was on coumadin for a history of DVT in her left leg following left foot injury. She has had numbness in her legs without pain. EMG/NCV 02/27/11 was normal except for some denervation in the left lumbosacral paraspinal muscles. B12 level was normal. She is on gabapentin 100 mg 2 at night with relief of symptoms. Her major complaint is that of ongoing tremor, which is worse in the morning. She has not had side effects such as grogginess or difficulty walking with the  primidone. She no longer cooks. Her tremor involves her writing and abilities to feed herself. She also continues to take gabapentin 133m 2 qHS for her neuropathy. She has not fallen per son. She was supposed to come back in 3 months but missed several appointments. She has been diagnosed with DM and has been started on metforim. She feels, her numbness and tingling is worse in her feet and she has now has started noticing burning sensation in her feet.    Her Past Medical History Is Significant For: Past Medical History  Diagnosis Date  . Hypertension   . High cholesterol   . DVT (deep venous thrombosis) ([facility]   . Pneumonia   . Anxiety   . Essential tremor 10/27/2013  . Obesity, unspecified 10/27/2013  . Newly diagnosed diabetes ([facility] 10/27/2013  . Unspecified hereditary and idiopathic peripheral neuropathy 10/27/2013    Her Past Surgical History Is Significant For: Past Surgical History  Procedure Laterality Date  . Foot surgery    . Cataract extraction Bilateral     His Family History Is Significant For: Family History  Problem Relation Age of Onset  . Coronary artery disease Other   . Stroke Mother   . Heart Problems Sister   . Heart Problems Brother     Her Social History Is Significant For: Social History   Social History  . Marital Status: Widowed    Spouse Name: N/A  . Number of Children: 5  . Years of Education: 9   Occupational History  .      does not work   Social History Main Topics  . Smoking status: Never Smoker   . Smokeless tobacco: Never Used  . Alcohol Use: No  . Drug Use: No  . Sexual Activity: No   Other Topics Concern  . None   Social History Narrative   Patient is right handed and resides with son    Her Allergies Are:  Allergies  Allergen Reactions  . Tape Rash    Please do not use Plastic Tape  :   Her Current Medications Are:  Outpatient Encounter Prescriptions as of 05/08/2016  Medication Sig  . ACCU-CHEK AVIVA PLUS test  strip   . ACCU-CHEK SOFTCLIX LANCETS lancets   . acetaminophen (TYLENOL) 500 MG tablet Take 500 mg by mouth every 6 (six) hours as needed (pain).  .Marland KitchenALPRAZolam (XANAX) 0.25 MG tablet Take 1 tablet (0.25 mg total) by mouth daily as needed for anxiety.  .Marland KitchenamLODipine (NORVASC) 2.5 MG tablet Take 2.5 mg by mouth daily.  .Marland Kitchenatorvastatin (LIPITOR) 20 MG tablet  Take 20 mg by mouth every evening.   [name] CALCIUM PO Take 1 tablet by mouth daily.  . Cholecalciferol (VITAMIN D PO) Take 1 tablet by mouth daily.  . clopidogrel (PLAVIX) 75 MG tablet Take 1 tablet (75 mg total) by mouth daily.  [name] gabapentin (NEURONTIN) 100 MG capsule Take 1 capsule (100 mg total) by mouth 2 (two) times daily.  [name] levETIRAcetam (KEPPRA) 500 MG tablet Take 1 tablet (500 mg total) by mouth 2 (two) times daily.  . meloxicam (MOBIC) 7.5 MG tablet Take 1 tablet (7.5 mg total) by mouth daily as needed for pain.  . polyethylene glycol (MIRALAX / GLYCOLAX) packet DISSOLVE 1 PACKAGE (17 G) IN 4 TO 8 OUNCES OF LIQUID AND DRINK 2 TIMES DAILY.  [name] primidone (MYSOLINE) 250 MG tablet TAKE 1 TABLET (250 MG TOTAL) BY MOUTH 2 (TWO) TIMES DAILY.  [name] RESTASIS 0.05 % ophthalmic emulsion Place 1 drop into both eyes 2 (two) times daily.   . [DISCONTINUED] clopidogrel (PLAVIX) 75 MG tablet TAKE 1 TABLET BY MOUTH EVERY DAY  . [DISCONTINUED] gabapentin (NEURONTIN) 100 MG capsule Take 1 capsule (100 mg total) by mouth 2 (two) times daily.  . [DISCONTINUED] levETIRAcetam (KEPPRA) 500 MG tablet Take 1 tablet (500 mg total) by mouth 2 (two) times daily.  . [DISCONTINUED] primidone (MYSOLINE) 250 MG tablet TAKE 1 TABLET (250 MG TOTAL) BY MOUTH 2 (TWO) TIMES DAILY.  . [DISCONTINUED] polyethylene glycol (MIRALAX / GLYCOLAX) packet Take 17 g by mouth 2 (two) times daily as needed.  . [DISCONTINUED] primidone (MYSOLINE) 250 MG tablet Take 1 tablet (250 mg total) by mouth 2 (two) times daily. (Patient not taking: Reported on 05/04/2016)   No facility-administered  encounter medications on file as of 05/08/2016.  :  Review of Systems:  Out of a complete 14 point review of systems, all are reviewed and negative with the exception of these symptoms as listed below:   Review of Systems  Neurological:       Patient fell about 10 days ago. Seen in ED after fall. Patient had CT scan on Monday.     Objective:  Neurologic Exam  Physical Exam Physical Examination:   Filed Vitals:   05/08/16 1036  BP: 142/68  Pulse: 92  Resp: 16   General Examination: The patient is a very pleasant 82 y.o. female in no acute distress. She appears well-developed and well-nourished and well groomed.    HEENT: Normocephalic, atraumatic, pupils are equal, round and reactive to light and accommodation. Hearing is intact. Extraocular tracking is good without nystagmus. She has a moderate degree of head tremor, no-no type, mildly worse. She has a mild degree of voice tremor. She is not dysarthric. Oropharynx is clear. She has mild airway crowding. Neck is otherwise supple with full range of motion.   Chest: Clear to auscultation without wheezing, rhonchi or crackles noted.  Heart: S1+S2+0, regular and normal without murmurs, rubs or gallops noted.   Abdomen: Soft, non-tender and non-distended with normal bowel sounds appreciated on auscultation.  Extremities: There is no pitting edema in the distal lower extremities bilaterally. Pedal pulses are intact.  Skin: Warm and dry without trophic changes noted. There are no varicose veins.  Musculoskeletal: exam reveals no obvious joint deformities, tenderness or joint swelling or erythema, with the exception of left-sided low back pain in the midline reported without radiation. She has no obvious bruise but is sore on palpation in the muscle area in the paraspinal area on the left lower back.  Neurologically:  Mental status: The patient is awake, alert and oriented in all 4 spheres. His memory, attention, language and knowledge  are appropriate. There is no aphasia, agnosia, apraxia or anomia. Speech is clear with normal prosody and enunciation. Thought process is linear. Mood is congruent and affect is normal.  Cranial nerves are as described above under HEENT exam. In addition, shoulder shrug is normal with equal shoulder height noted. Motor exam: Normal bulk, strength and tone is noted on the left side. On the right side she has minimal residual weakness, in her right grip and hip flexion primarily, stable from before. There is no drift, but she does have a slight resting tremor bilaterally. She has a moderate bilateral upper extremity postural and action tremor. There is no rebound. Romberg was not tested. Reflexes are 1+ throughout. Fine motor skills are mildly impaired bilaterally in the upper and lower extremities. Cerebellar testing shows no dysmetria or intention tremor on finger to nose testing. Heel to shin is difficult for her. There is no truncal or gait ataxia.  Sensory exam: intact to light touch but decreased in the lower extremities bilaterally and mild decrease in temperature and pinprick sensation in her right arm.   Gait, station and balance: She stands up slowly with assistance and uses her 4-prong cane in the left hand. She walks slowly and has a very slight limp on the right side, all stable. She turns cautiously. Tandem walk is not possible, balance mildly impaired.   Assessment and Plan:   In summary, [name] [name] is a very pleasant 82 year old female with a history of obesity, previous DVT, ET, stroke as recent onset partial seizures about 9 months ago, who presents for FU of her ET, Stroke and Sz d/o. She has done well and had completed outpatient therapy and was doing rather well when she visited her daughter in [place] and then her granddaughter in [place]. Unfortunately, before she came back to [place] she had a fall in [place] and since then has residual soreness in the left lower back,  she has no obvious injuries, no bruise, and had a bone scan which showed no acute injuries, no fracture and some degenerative changes, I discussed this with the patient and her son today. Unfortunately, musculoskeletal pain can take a while to get better. I suggested she use the Mobic she was given in the emergency room on 05/04/2016 and use Tylenol as needed. She can use a microwavable heat pad as well. She is advised not to fall asleep on it for fear of scalding. Tremor-wise, it is a little bit more pronounced today, weakness is about the same. She feels more heaviness in her right leg. She is advised to continue with her current medications and advised that when she is hurting her overall does not feel well she will notice that her tremor will exacerbate in her right leg heaviness make it worse temporarily as well. For now, we will keep everything the same, I renewed her prescriptions for Keppra, Plavix, Mysoline and Neurontin today. She is advised to use her walker at all times and particularly turns slowly.  I suggested a follow-up in 6 months, sooner as needed. If necessary, we can try a round of physical therapy again in the interim. I discussed this with the patient and her son. I answered all her questions today and they were in agreement.  I spent 25 minutes in total face-to-face time with the patient, more than 50% of which was  spent in counseling and coordination of care, reviewing test results, reviewing medication and discussing or reviewing the diagnosis of ET, stroke, sz, the prognosis and treatment options.

## 2016-06-01 ENCOUNTER — Other Ambulatory Visit: Payer: Self-pay | Admitting: Neurology

## 2016-06-06 ENCOUNTER — Other Ambulatory Visit: Payer: Self-pay | Admitting: Neurology

## 2016-06-14 ENCOUNTER — Other Ambulatory Visit: Payer: Self-pay | Admitting: Internal Medicine

## 2016-06-14 DIAGNOSIS — Z1231 Encounter for screening mammogram for malignant neoplasm of breast: Secondary | ICD-10-CM

## 2016-06-17 ENCOUNTER — Ambulatory Visit
Admission: RE | Admit: 2016-06-17 | Discharge: 2016-06-17 | Disposition: A | Discharge status: Home / Self Care | Payer: Medicare Other | Source: Ambulatory Visit | Attending: Internal Medicine | Admitting: Internal Medicine

## 2016-06-17 DIAGNOSIS — Z1231 Encounter for screening mammogram for malignant neoplasm of breast: Secondary | ICD-10-CM

## 2016-06-21 ENCOUNTER — Other Ambulatory Visit (HOSPITAL_COMMUNITY): Payer: Self-pay | Admitting: *Deleted

## 2016-06-24 ENCOUNTER — Ambulatory Visit (HOSPITAL_COMMUNITY)
Admission: RE | Admit: 2016-06-24 | Discharge: 2016-06-24 | Disposition: A | Discharge status: Home / Self Care | Payer: Medicare Other | Source: Ambulatory Visit | Attending: Rheumatology | Admitting: Rheumatology

## 2016-06-24 DIAGNOSIS — M810 Age-related osteoporosis without current pathological fracture: Secondary | ICD-10-CM | POA: Diagnosis not present

## 2016-06-24 MED ORDER — ZOLEDRONIC ACID 5 MG/100ML IV SOLN
INTRAVENOUS | Status: AC
  Filled 2016-06-24: qty 100

## 2016-06-24 MED ORDER — SODIUM CHLORIDE 0.9 % IV SOLN
Freq: Once | INTRAVENOUS | Status: AC
  Administered 2016-06-24: 11:00:00 via INTRAVENOUS

## 2016-06-24 MED ORDER — ZOLEDRONIC ACID 5 MG/100ML IV SOLN
5.0000 mg | Freq: Once | INTRAVENOUS | Status: AC
  Administered 2016-06-24: 5 mg via INTRAVENOUS

## 2016-06-24 NOTE — Progress Notes (Signed)
Patient was unable to answer questions posed in English without the help of her son. Called language line for interpreter. Interpreter Aree read waiver to patient in [place]. Patient signed for her son to interpret for her. Son voiced displeasure with need for interpreter because they have not used one in the past. I explained hospital policy to son.

## 2016-10-09 ENCOUNTER — Emergency Department (HOSPITAL_COMMUNITY): Payer: Medicare Other

## 2016-10-09 ENCOUNTER — Emergency Department (HOSPITAL_BASED_OUTPATIENT_CLINIC_OR_DEPARTMENT_OTHER)
Admit: 2016-10-09 | Discharge: 2016-10-09 | Disposition: A | Discharge status: Home / Self Care | Payer: Medicare Other | Attending: Emergency Medicine | Admitting: Emergency Medicine

## 2016-10-09 ENCOUNTER — Emergency Department (HOSPITAL_COMMUNITY)
Admission: EM | Admit: 2016-10-09 | Discharge: 2016-10-09 | Disposition: A | Discharge status: Home / Self Care | Payer: Medicare Other | Attending: Emergency Medicine | Admitting: Emergency Medicine

## 2016-10-09 ENCOUNTER — Encounter (HOSPITAL_COMMUNITY): Payer: Self-pay | Admitting: *Deleted

## 2016-10-09 DIAGNOSIS — M79609 Pain in unspecified limb: Secondary | ICD-10-CM

## 2016-10-09 DIAGNOSIS — I10 Essential (primary) hypertension: Secondary | ICD-10-CM | POA: Diagnosis not present

## 2016-10-09 DIAGNOSIS — Z79899 Other long term (current) drug therapy: Secondary | ICD-10-CM | POA: Diagnosis not present

## 2016-10-09 DIAGNOSIS — N3000 Acute cystitis without hematuria: Secondary | ICD-10-CM | POA: Diagnosis not present

## 2016-10-09 DIAGNOSIS — M5410 Radiculopathy, site unspecified: Secondary | ICD-10-CM

## 2016-10-09 DIAGNOSIS — M79605 Pain in left leg: Secondary | ICD-10-CM | POA: Insufficient documentation

## 2016-10-09 DIAGNOSIS — M545 Low back pain: Secondary | ICD-10-CM | POA: Insufficient documentation

## 2016-10-09 DIAGNOSIS — E119 Type 2 diabetes mellitus without complications: Secondary | ICD-10-CM | POA: Diagnosis not present

## 2016-10-09 DIAGNOSIS — Z8673 Personal history of transient ischemic attack (TIA), and cerebral infarction without residual deficits: Secondary | ICD-10-CM | POA: Insufficient documentation

## 2016-10-09 DIAGNOSIS — R05 Cough: Secondary | ICD-10-CM | POA: Diagnosis not present

## 2016-10-09 DIAGNOSIS — R5383 Other fatigue: Secondary | ICD-10-CM

## 2016-10-09 DIAGNOSIS — R059 Cough, unspecified: Secondary | ICD-10-CM

## 2016-10-09 LAB — CBC WITH DIFFERENTIAL/PLATELET
BASOS PCT: 1 %
Basophils Absolute: 0 10*3/uL (ref 0.0–0.1)
EOS ABS: 0.1 10*3/uL (ref 0.0–0.7)
Eosinophils Relative: 1 %
HCT: 43.1 % (ref 36.0–46.0)
Hemoglobin: 14.5 g/dL (ref 12.0–15.0)
Lymphocytes Relative: 44 %
Lymphs Abs: 1.8 10*3/uL (ref 0.7–4.0)
MCH: 30.8 pg (ref 26.0–34.0)
MCHC: 33.6 g/dL (ref 30.0–36.0)
MCV: 91.5 fL (ref 78.0–100.0)
MONO ABS: 0.3 10*3/uL (ref 0.1–1.0)
MONOS PCT: 7 %
Neutro Abs: 2 10*3/uL (ref 1.7–7.7)
Neutrophils Relative %: 48 %
PLATELETS: 120 10*3/uL — AB (ref 150–400)
RBC: 4.71 MIL/uL (ref 3.87–5.11)
RDW: 14 % (ref 11.5–15.5)
WBC: 4.2 10*3/uL (ref 4.0–10.5)

## 2016-10-09 LAB — URINALYSIS, ROUTINE W REFLEX MICROSCOPIC
Bilirubin Urine: NEGATIVE
GLUCOSE, UA: NEGATIVE mg/dL
HGB URINE DIPSTICK: NEGATIVE
KETONES UR: NEGATIVE mg/dL
Nitrite: NEGATIVE
PROTEIN: NEGATIVE mg/dL
Specific Gravity, Urine: 1.011 (ref 1.005–1.030)
pH: 7.5 (ref 5.0–8.0)

## 2016-10-09 LAB — BASIC METABOLIC PANEL
Anion gap: 7 (ref 5–15)
BUN: 10 mg/dL (ref 6–20)
CALCIUM: 8.9 mg/dL (ref 8.9–10.3)
CO2: 28 mmol/L (ref 22–32)
CREATININE: 0.79 mg/dL (ref 0.44–1.00)
Chloride: 105 mmol/L (ref 101–111)
GFR calc non Af Amer: >60 mL/min (ref >60)
Glucose, Bld: 84 mg/dL (ref 65–99)
Potassium: 4.2 mmol/L (ref 3.5–5.1)
SODIUM: 140 mmol/L (ref 135–145)

## 2016-10-09 LAB — URINE MICROSCOPIC-ADD ON: RBC / HPF: NONE SEEN RBC/hpf (ref 0–5)

## 2016-10-09 LAB — PROTIME-INR
INR: 1
PROTHROMBIN TIME: 13.2 s (ref 11.4–15.2)

## 2016-10-09 MED ORDER — CYCLOBENZAPRINE HCL 5 MG PO TABS: 5.0000 mg | ORAL_TABLET | Freq: Two times a day (BID) | ORAL | 0 refills | Status: DC | PRN

## 2016-10-09 MED ORDER — CEPHALEXIN 500 MG PO CAPS: 500.0000 mg | ORAL_CAPSULE | Freq: Two times a day (BID) | ORAL | 0 refills | Status: AC

## 2016-10-09 NOTE — ED Notes (Signed)
Papers reviewed with patient and husband to translate. They verbalize understanding and leaving in a wheelchair today

## 2016-10-09 NOTE — Progress Notes (Signed)
*  PRELIMINARY RESULTS* Vascular Ultrasound Left lower extremity venous duplex has been completed.  Preliminary findings: No evidence of DVT or baker's cyst.  Farrel DemarkJill Eunice, RDMS, RVT  10/09/2016, 3:15 PM

## 2016-10-09 NOTE — Discharge Instructions (Signed)
Please follow-up with your primary physician for further management of your back and leg pain. Please take your antibiotics for urinary tract infection which [employer] is causing your fatigue. Please stay hydrated. Please use the low-dose muscle relaxant for your back pain if it continues. Please return to the nearest emergency department if symptoms do not improve or worsen.

## 2016-10-09 NOTE — ED Triage Notes (Signed)
Pt reports left side back pain/hip pain that is radiating into buttock and down her left leg. Denies any injury. Ambulated at triage with walker. Pt also having recent non productive cough and denies fever.

## 2016-10-09 NOTE — ED Provider Notes (Signed)
[facility]-EMERGENCY DEPT Provider Note   CSN: [id number] Arrival date & time: 10/09/16  1024     History   Chief Complaint Chief Complaint  Patient presents with  . Back Pain  . Cough    HPI [name] [name] is a 82 y.o. female With a past medical history significant for diabetes, anxiety,Left lower extremity DVT, stroke, and prior seizures who presents with low back pain, left leg pain, and cough. Patient is accompanied by her husband to provides further history. He reports that patient had a fall several months ago and has had back pain since then. At that time, she was told she had several compression fractures in her spine. He says that over the last few weeks the pain has worsened and it is now shooting down her left leg. She denies any numbness, tingling, or weakness in the lower extremities. She does say that she has had some residual deficits in her left side due to prior stroke however, she says are unchanged from prior. She reports the pain radiates down her left leg towards her knee. She describes it as moderate. She says walking makes it worse. She denies any new falls. She denies any fevers, chills, chest pain, shortness of breath, nausea, vomiting. She does report some fatigue. She denies dysuria but thinks she may be peeing more.  She also reports a dry cough for the last two days.  The history is provided by the patient, the spouse and medical records. No language interpreter was used.  Leg Pain   This is a new problem. The current episode started more than 1 week ago (one month). The problem occurs daily. The problem has been gradually worsening. The pain is present in the back, left hip and left upper leg. The quality of the pain is described as sharp. The pain is at a severity of 7/10. The pain is moderate. Pertinent negatives include no numbness, full range of motion, no stiffness, no tingling and no itching. The symptoms are aggravated by standing and activity. She has tried  nothing for the symptoms. The treatment provided no relief. There has been a history of trauma (fall several months ago).  Back Pain   This is a chronic problem. The current episode started more than 1 week ago. The problem occurs constantly. The pain is associated with falling. The pain is present in the lumbar spine. The quality of the pain is described as shooting, stabbing and aching. The pain radiates to the left thigh and left knee. The pain is moderate. The pain is the same all the time. Associated symptoms include leg pain. Pertinent negatives include no chest pain, no fever, no numbness, no headaches, no abdominal pain, no abdominal swelling, no bowel incontinence, no perianal numbness, no bladder incontinence, no dysuria, no pelvic pain, no paresthesias, no paresis, no tingling and no weakness. She has tried nothing for the symptoms.  Cough  This is a new problem. The current episode started yesterday. The problem occurs constantly. The problem has not changed since onset.The cough is non-productive. There has been no fever. Pertinent negatives include no chest pain, no chills, no headaches, no rhinorrhea and no shortness of breath.    Past Medical History:  Diagnosis Date  . Anxiety   . DVT (deep venous thrombosis) (HCC)   . Essential tremor 10/27/2013  . High cholesterol   . Hypertension   . Newly diagnosed diabetes (HCC) 10/27/2013  . Obesity, unspecified 10/27/2013  . Pneumonia   . Unspecified hereditary  and idiopathic peripheral neuropathy 10/27/2013    Patient Active Problem List   Diagnosis Date Noted  . Seizures (HCC) 08/06/2014  . Abnormal involuntary movement 08/05/2014  . Hemiplegia, unspecified, affecting dominant side 08/05/2014  . Right sided weakness 08/05/2014  . CVA (cerebral infarction) 07/21/2014  . Left leg pain 07/20/2014  . HTN (hypertension) 07/18/2014  . HLD (hyperlipidemia) 07/18/2014  . Thrombocytopenia, unspecified 07/18/2014  . Essential tremor  10/27/2013  . Obesity, unspecified 10/27/2013  . DM2 (diabetes mellitus, type 2) (HCC) 10/27/2013  . Unspecified hereditary and idiopathic peripheral neuropathy 10/27/2013    Past Surgical History:  Procedure Laterality Date  . CATARACT EXTRACTION Bilateral   . FOOT SURGERY      OB History    No data available       Home Medications    Prior to Admission medications   Medication Sig Start Date End Date Taking? Authorizing Provider  ACCU-CHEK AVIVA PLUS test strip  11/03/14   Historical Provider, MD  ACCU-CHEK SOFTCLIX LANCETS lancets  08/17/14   Historical Provider, MD  acetaminophen (TYLENOL) 500 MG tablet Take 500 mg by mouth every 6 (six) hours as needed (pain).    Historical Provider, MD  ALPRAZolam Prudy Feeler(XANAX) 0.25 MG tablet Take 1 tablet (0.25 mg total) by mouth daily as needed for anxiety. 08/10/14   Marinda ElkAbraham Feliz Ortiz, MD  amLODipine (NORVASC) 2.5 MG tablet Take 2.5 mg by mouth daily. 02/13/15   Historical Provider, MD  atorvastatin (LIPITOR) 20 MG tablet Take 20 mg by mouth every evening.     Historical Provider, MD  CALCIUM PO Take 1 tablet by mouth daily.    Historical Provider, MD  Cholecalciferol (VITAMIN D PO) Take 1 tablet by mouth daily.    Historical Provider, MD  clopidogrel (PLAVIX) 75 MG tablet Take 1 tablet (75 mg total) by mouth daily. 05/08/16   Huston FoleySaima Athar, MD  gabapentin (NEURONTIN) 100 MG capsule Take 1 capsule (100 mg total) by mouth 2 (two) times daily. 05/08/16   Huston FoleySaima Athar, MD  levETIRAcetam (KEPPRA) 500 MG tablet Take 1 tablet (500 mg total) by mouth 2 (two) times daily. 05/08/16   Huston FoleySaima Athar, MD  meloxicam (MOBIC) 7.5 MG tablet Take 1 tablet (7.5 mg total) by mouth daily as needed for pain. 05/04/16   Raeford RazorStephen Kohut, MD  polyethylene glycol (MIRALAX / GLYCOLAX) packet DISSOLVE 1 PACKAGE (17 G) IN 4 TO 8 OUNCES OF LIQUID AND DRINK 2 TIMES DAILY. 12/27/14   Erick ColaceAndrew E Kirsteins, MD  primidone (MYSOLINE) 250 MG tablet TAKE 1 TABLET (250 MG TOTAL) BY MOUTH 2 (TWO)  TIMES DAILY. 05/08/16   Huston FoleySaima Athar, MD  RESTASIS 0.05 % ophthalmic emulsion Place 1 drop into both eyes 2 (two) times daily.  09/29/14   Historical Provider, MD    Family History Family History  Problem Relation Age of Onset  . Stroke Mother   . Heart Problems Brother   . Coronary artery disease Other   . Heart Problems Sister     Social History Social History  Substance Use Topics  . Smoking status: Never Smoker  . Smokeless tobacco: Never Used  . Alcohol use No     Allergies   Tape   Review of Systems Review of Systems  Constitutional: Negative for activity change, chills, diaphoresis, fatigue and fever.  HENT: Negative for congestion and rhinorrhea.   Eyes: Negative for visual disturbance.  Respiratory: Positive for cough. Negative for chest tightness, shortness of breath and stridor.   Cardiovascular: Negative for  chest pain, palpitations and leg swelling.  Gastrointestinal: Negative for abdominal distention, abdominal pain, bowel incontinence, constipation, diarrhea, nausea and vomiting.  Genitourinary: Negative for bladder incontinence, difficulty urinating, dysuria, flank pain, frequency, hematuria, menstrual problem, pelvic pain, vaginal bleeding and vaginal discharge.  Musculoskeletal: Positive for back pain. Negative for neck pain, neck stiffness and stiffness.  Skin: Negative for itching, rash and wound.  Neurological: Negative for dizziness, tingling, weakness, light-headedness, numbness, headaches and paresthesias.  Psychiatric/Behavioral: Negative for agitation and confusion.  All other systems reviewed and are negative.    Physical Exam Updated Vital Signs BP 140/67 (BP Location: Right Arm)   Pulse 70   Temp 97.7 F (36.5 C) (Oral)   Resp (!) 27   Ht 5' (1.524 m)   Wt 133 lb (60.3 kg)   SpO2 97%   BMI 25.97 kg/m   Physical Exam  Constitutional: She is oriented to person, place, and time. She appears well-developed and well-nourished. No distress.   HENT:  Head: Normocephalic and atraumatic.  Mouth/Throat: Oropharynx is clear and moist. No oropharyngeal exudate.  Eyes: Conjunctivae and EOM are normal. Pupils are equal, round, and reactive to light.  Neck: Normal range of motion. Neck supple.  Cardiovascular: Normal rate and regular rhythm.   No murmur heard. Pulmonary/Chest: Effort normal and breath sounds normal. No respiratory distress. She has no wheezes. She exhibits no tenderness.  Abdominal: Soft. There is no tenderness.  Musculoskeletal: She exhibits tenderness. She exhibits no edema.       Lumbar back: She exhibits tenderness and pain. She exhibits no edema and no laceration.       Back:       Legs: Neurological: She is alert and oriented to person, place, and time. She has normal strength. She displays tremor. She displays normal reflexes. No cranial nerve deficit or sensory deficit. She exhibits normal muscle tone. Coordination normal.  No asymmetry in strength, sensation, or reflexes in lower extremities.  Symmetric pulses in lower extremities.  Significant resting tremor in all extremities.  Skin: Skin is warm and dry. Capillary refill takes less than 2 seconds. No rash noted.  Psychiatric: She has a normal mood and affect.  Nursing note and vitals reviewed.    ED Treatments / Results  Labs (all labs ordered are listed, but only abnormal results are displayed) Labs Reviewed  CBC WITH DIFFERENTIAL/PLATELET - Abnormal; Notable for the following:       Result Value   Platelets 120 (*)    All other components within normal limits  URINALYSIS, ROUTINE W REFLEX MICROSCOPIC (NOT AT [facility]) - Abnormal; Notable for the following:    Leukocytes, UA SMALL (*)    All other components within normal limits  URINE MICROSCOPIC-ADD ON - Abnormal; Notable for the following:    Squamous Epithelial / LPF 6-30 (*)    Bacteria, UA RARE (*)    All other components within normal limits  URINE CULTURE  BASIC METABOLIC PANEL    PROTIME-INR    EKG  EKG Interpretation None       Radiology Dg Chest 2 View  Result Date: 10/09/2016 CLINICAL DATA:  Pain following fall EXAM: CHEST  2 VIEW COMPARISON:  July 18, 2014 FINDINGS: There is patchy left base atelectasis. Lungs elsewhere clear. Heart is mildly enlarged with pulmonary vascularity within normal limits. No adenopathy. There is atherosclerotic calcification in the aorta. There is degenerative change in the thoracic spine. There are wedge fractures in the lower thoracic and upper lumbar regions, stable. IMPRESSION:  Left base atelectasis. No edema or consolidation. There is aortic atherosclerosis. Electronically Signed   By: Bretta BangWilliam  Woodruff III M.D.   On: 10/09/2016 13:42   Ct Lumbar Spine Wo Contrast  Result Date: 10/09/2016 CLINICAL DATA:  Left-sided back in left leg pain. EXAM: CT LUMBAR SPINE WITHOUT CONTRAST TECHNIQUE: Multidetector CT imaging of the lumbar spine was performed without intravenous contrast administration. Multiplanar CT image reconstructions were also generated. COMPARISON:  CT scan of March 17, 2013. FINDINGS: Segmentation: 5 non rib-bearing lumbar type vertebral bodies are noted. Alignment: Grade 1 anterolisthesis of L4-5 is noted secondary to posterior facet joint hypertrophy. Vertebrae: Moderate compression deformity of L1 vertebral body is noted with sclerosis most consistent with old fracture. No other fracture is noted in the lumbar spine. Paraspinal and other soft tissues: Atherosclerosis of abdominal aorta is noted without aneurysm formation. No significant central spinal canal stenosis is noted. Disc levels: Vacuum phenomenon is noted at all visualized levels suggesting early degenerative joint disease. Moderate disc space narrowing is noted at L5-S1 consistent with degenerative disc disease. IMPRESSION: Aortic atherosclerosis. Multilevel degenerative disc disease is noted, most prominently seen at L5-S1. Moderate compression deformity of  L1 vertebral body is noted most consistent with old fracture. No definite evidence of acute fracture is noted. Electronically Signed   By: Lupita RaiderJames  Green Jr, M.D.   On: 10/09/2016 14:34   Dg Hip Unilat W Or Wo Pelvis 2-3 Views Left  Result Date: 10/09/2016 CLINICAL DATA:  Pain following fall EXAM: DG HIP (WITH OR WITHOUT PELVIS) 2-3V LEFT COMPARISON:  None. FINDINGS: Frontal pelvis as well as frontal and lateral left hip images were obtained. Bones are diffusely osteoporotic. There is no fracture or dislocation. There is slight symmetric narrowing of both hip joints. There is no erosive change. IMPRESSION: Bones osteoporotic. Mild symmetric narrowing both hip joints. No acute fracture or dislocation. Electronically Signed   By: Bretta BangWilliam  Woodruff III M.D.   On: 10/09/2016 13:44    Procedures Procedures (including critical care time)  Medications Ordered in ED Medications - No data to display   Initial Impression / Assessment and Plan / ED Course  I have reviewed the triage vital signs and the nursing notes.  Pertinent labs & imaging results that were available during my care of the patient were reviewed by me and considered in my medical decision making (see chart for details).  Clinical Course    [name] [name] is a 82 y.o. female With a past medical history significant for diabetes, anxiety,Left lower extremity DVT, stroke, and prior seizures who presents with low back pain, left leg pain, and cough.  History exam are seen above.  Exam significant for left low back tenderness. Pain went down leg in the sciatic distribution. No leg swelling appreciated. No asymmetry in neurologic exam a lower extremity. Baseline trimmer present. Lungs clear.  Due to dry cough, x-ray obtained. No evidence of pneumonia. With report of prior lumbar fractures and worsened pain, CT obtained of the lumbar spine. X-ray obtained of pelvis and hips with tenderness on the left side Of hips. Due to report of similar  pain associated with prior DDT, ultrasound was obtained. DVT study negative. X-ray showed no evidence of acute fracture on hip or pelvis.  CT showed moderate L1 compression fracture that is old. No evidence of acute fracture.  Urinalysis showed evidence of UTI. In setting of fatigue, patient will be treated for UTI. Suspect patient's back pain is radicular and secondary to prior lumbar injuries. As  exam revealed muscle tenderness causing worsened pain, field spasm is playing a component. Patient will be given prescription for low-dose muscle relaxant. Patient given prescription for antibiotics for UTI.  Patient instructed to follow up with PCP index several days. Patient and husband understood return precautions. Family had no other questions or concerns at patient discharged in good condition.   Final Clinical Impressions(s) / ED Diagnoses   Final diagnoses:  Radicular pain of left lower extremity  Acute cystitis without hematuria  Fatigue, unspecified type  Cough    New Prescriptions Discharge Medication List as of 10/09/2016  5:23 PM    START taking these medications   Details  cephALEXin (KEFLEX) 500 MG capsule Take 1 capsule (500 mg total) by mouth 2 (two) times daily., Starting Wed 10/09/2016, Until Wed 10/16/2016, Print    cyclobenzaprine (FLEXERIL) 5 MG tablet Take 1 tablet (5 mg total) by mouth 2 (two) times daily as needed for muscle spasms., Starting Wed 10/09/2016, Print        Clinical Impression: 1. Radicular pain of left lower extremity   2. Acute cystitis without hematuria   3. Fatigue, unspecified type   4. Cough     Disposition: Discharge  Condition: Good  I have discussed the results, Dx and Tx plan with the pt(& family if present). He/she/they expressed understanding and agree(s) with the plan. Discharge instructions discussed at great length. Strict return precautions discussed and pt &/or family have verbalized understanding of the instructions. No  further questions at time of discharge.    Discharge Medication List as of 10/09/2016  5:23 PM    START taking these medications   Details  cephALEXin (KEFLEX) 500 MG capsule Take 1 capsule (500 mg total) by mouth 2 (two) times daily., Starting Wed 10/09/2016, Until Wed 10/16/2016, Print    cyclobenzaprine (FLEXERIL) 5 MG tablet Take 1 tablet (5 mg total) by mouth 2 (two) times daily as needed for muscle spasms., Starting Wed 10/09/2016, Print        Follow Up: Dorothyann Peng, MD [address] [place] [address] [place] [address] [phone]     [facility] EMERGENCY DEPARTMENT [address] [id number] mc [place] [address] [phone]  If symptoms worsen     Heide Scales, MD 10/09/16 2237

## 2016-10-10 DIAGNOSIS — R102 Pelvic and perineal pain: Secondary | ICD-10-CM | POA: Insufficient documentation

## 2016-10-10 DIAGNOSIS — M549 Dorsalgia, unspecified: Secondary | ICD-10-CM | POA: Insufficient documentation

## 2016-10-10 NOTE — Progress Notes (Deleted)
Office Visit Note  Patient: [name] [name]             Date of Birth: [birth year]           MRN: [id number]             PCP: Gwynneth Aliment, MD Referring: Dorothyann Peng, MD Visit Date: 10/16/2016 Occupation: @GUAROCC @    Subjective:  No chief complaint on file.   History of Present Illness: [name] [name] is a 82 y.o. female ***   Activities of Daily Living:  Patient reports morning stiffness for *** {minute/hour:19697}.   Patient {ACTIONS;DENIES/REPORTS:21021675::"Denies"} nocturnal pain.  Difficulty dressing/grooming: {ACTIONS;DENIES/REPORTS:21021675::"Denies"} Difficulty climbing stairs: {ACTIONS;DENIES/REPORTS:21021675::"Denies"} Difficulty getting out of chair: {ACTIONS;DENIES/REPORTS:21021675::"Denies"} Difficulty using hands for taps, buttons, cutlery, and/or writing: {ACTIONS;DENIES/REPORTS:21021675::"Denies"}   No Rheumatology ROS completed.   PMFS History:  Patient Active Problem List   Diagnosis Date Noted  . Seizures (HCC) 08/06/2014  . Abnormal involuntary movement 08/05/2014  . Hemiplegia, unspecified, affecting dominant side 08/05/2014  . Right sided weakness 08/05/2014  . CVA (cerebral infarction) 07/21/2014  . Left leg pain 07/20/2014  . HTN (hypertension) 07/18/2014  . HLD (hyperlipidemia) 07/18/2014  . Thrombocytopenia, unspecified 07/18/2014  . Essential tremor 10/27/2013  . Obesity, unspecified 10/27/2013  . DM2 (diabetes mellitus, type 2) (HCC) 10/27/2013  . Unspecified hereditary and idiopathic peripheral neuropathy 10/27/2013    Past Medical History:  Diagnosis Date  . Anxiety   . DVT (deep venous thrombosis) (HCC)   . Essential tremor 10/27/2013  . High cholesterol   . Hypertension   . Newly diagnosed diabetes (HCC) 10/27/2013  . Obesity, unspecified 10/27/2013  . Pneumonia   . Unspecified hereditary and idiopathic peripheral neuropathy 10/27/2013    Family History  Problem Relation Age of Onset  . Stroke Mother   . Heart  Problems Brother   . Coronary artery disease Other   . Heart Problems Sister    Past Surgical History:  Procedure Laterality Date  . CATARACT EXTRACTION Bilateral   . FOOT SURGERY     Social History   Social History Narrative   Patient is right handed and resides with son     Objective: Vital Signs: There were no vitals taken for this visit.   Physical Exam   Musculoskeletal Exam: ***  CDAI Exam: No CDAI exam completed.    Investigation: Findings:  1.  06/05/16:  CBC was normal with platelet count of 129, c/w previous  Comprehensive metabolic panel was normal  2.  X-ray of lumbar spine, PA and lateral 05/06/16, showed L5-S1 listhesis, L1 compression fracture, T12-T11 wedging, which is associated with old fracture and some facet joint arthropathy.  AP pelvis x-ray did not show any obvious fracture, although she had a lot of bowel gas and impaction, which is making the visualization very difficult.        Imaging: Dg Chest 2 View  Result Date: 10/09/2016 CLINICAL DATA:  Pain following fall EXAM: CHEST  2 VIEW COMPARISON:  July 18, 2014 FINDINGS: There is patchy left base atelectasis. Lungs elsewhere clear. Heart is mildly enlarged with pulmonary vascularity within normal limits. No adenopathy. There is atherosclerotic calcification in the aorta. There is degenerative change in the thoracic spine. There are wedge fractures in the lower thoracic and upper lumbar regions, stable. IMPRESSION: Left base atelectasis. No edema or consolidation. There is aortic atherosclerosis. Electronically Signed   By: Bretta Bang III M.D.   On: 10/09/2016 13:42   Ct Lumbar Spine Wo Contrast  Result Date:  10/09/2016 CLINICAL DATA:  Left-sided back in left leg pain. EXAM: CT LUMBAR SPINE WITHOUT CONTRAST TECHNIQUE: Multidetector CT imaging of the lumbar spine was performed without intravenous contrast administration. Multiplanar CT image reconstructions were also generated. COMPARISON:  CT  scan of March 17, 2013. FINDINGS: Segmentation: 5 non rib-bearing lumbar type vertebral bodies are noted. Alignment: Grade 1 anterolisthesis of L4-5 is noted secondary to posterior facet joint hypertrophy. Vertebrae: Moderate compression deformity of L1 vertebral body is noted with sclerosis most consistent with old fracture. No other fracture is noted in the lumbar spine. Paraspinal and other soft tissues: Atherosclerosis of abdominal aorta is noted without aneurysm formation. No significant central spinal canal stenosis is noted. Disc levels: Vacuum phenomenon is noted at all visualized levels suggesting early degenerative joint disease. Moderate disc space narrowing is noted at L5-S1 consistent with degenerative disc disease. IMPRESSION: Aortic atherosclerosis. Multilevel degenerative disc disease is noted, most prominently seen at L5-S1. Moderate compression deformity of L1 vertebral body is noted most consistent with old fracture. No definite evidence of acute fracture is noted. Electronically Signed   By: Lupita Raider, M.D.   On: 10/09/2016 14:34   Dg Hip Unilat W Or Wo Pelvis 2-3 Views Left  Result Date: 10/09/2016 CLINICAL DATA:  Pain following fall EXAM: DG HIP (WITH OR WITHOUT PELVIS) 2-3V LEFT COMPARISON:  None. FINDINGS: Frontal pelvis as well as frontal and lateral left hip images were obtained. Bones are diffusely osteoporotic. There is no fracture or dislocation. There is slight symmetric narrowing of both hip joints. There is no erosive change. IMPRESSION: Bones osteoporotic. Mild symmetric narrowing both hip joints. No acute fracture or dislocation. Electronically Signed   By: Bretta Bang III M.D.   On: 10/09/2016 13:44    Speciality Comments: No specialty comments available.    Procedures:  No procedures performed Allergies: Tape   Assessment / Plan: Visit Diagnoses: No diagnosis found.    Orders: No orders of the defined types were placed in this encounter.  No  orders of the defined types were placed in this encounter.   Face-to-face time spent with patient was *** minutes. 50% of time was spent in counseling and coordination of care.  Follow-Up Instructions: No Follow-up on file.   Charmian Forbis, RT

## 2016-10-11 LAB — URINE CULTURE

## 2016-10-16 ENCOUNTER — Ambulatory Visit: Payer: Medicare Other | Admitting: Rheumatology

## 2016-11-11 ENCOUNTER — Encounter: Payer: Self-pay | Admitting: Neurology

## 2016-11-11 ENCOUNTER — Ambulatory Visit (INDEPENDENT_AMBULATORY_CARE_PROVIDER_SITE_OTHER): Payer: Medicare Other | Admitting: Neurology

## 2016-11-11 VITALS — BP 126/73 | HR 67 | Resp 20 | Ht 60.0 in | Wt 157.0 lb

## 2016-11-11 DIAGNOSIS — R7989 Other specified abnormal findings of blood chemistry: Secondary | ICD-10-CM

## 2016-11-11 DIAGNOSIS — R531 Weakness: Secondary | ICD-10-CM | POA: Diagnosis not present

## 2016-11-11 DIAGNOSIS — G250 Essential tremor: Secondary | ICD-10-CM

## 2016-11-11 DIAGNOSIS — I639 Cerebral infarction, unspecified: Secondary | ICD-10-CM | POA: Diagnosis not present

## 2016-11-11 DIAGNOSIS — G40109 Localization-related (focal) (partial) symptomatic epilepsy and epileptic syndromes with simple partial seizures, not intractable, without status epilepticus: Secondary | ICD-10-CM | POA: Diagnosis not present

## 2016-11-11 DIAGNOSIS — R945 Abnormal results of liver function studies: Secondary | ICD-10-CM

## 2016-11-11 NOTE — Progress Notes (Signed)
Subjective:    Patient ID: [name] [name] is a 82 y.o. female.  HPI     Interim history:  [name] [name] is a very pleasant 82 year old Middle Eastern/[place]ian lady with an underlying medical history of hypertension, hyperlipidemia, DVT, pneumonia, anxiety, obesity, diabetes, peripheral neuropathy, stroke and essential tremor, who presents for follow-up consultation of her essential tremor and her history of stroke. She is accompanied by her son again today. I last saw her on 05/08/2016, at which time she had recently fallen about 10 days prior. She had visited her grandmother daughter who is expecting a baby and fell backwards landed on her back. She had left-sided back pain since then. She went to the emergency room on 05/04/2016 and was given an injection with Decadron, Toradol, Dilaudid, but did not have much in way of relief. She had a bone scan on 05/06/16, which I reviewed: IMPRESSION: 1. No evidence of a recent fracture. 2. No evidence of neoplastic disease to bone. 3. Significant degenerative uptake noted along spine as well as involving multiple bilateral joints. She reported that her tremor was worse. She had more stress and also more pain.  I suggested she continue with her Keppra, Plavix, Mysoline and Neurontin.  Today, 11/11/2016: She reports no recent fall, tremor mostly stable, was told to avoid tylenol for abn. LFTs. She uses a stationary bike for exercise some, helps with LBP, which is about the same, R leg weakness is about the same. Of note, she went to the emergency room on 10/09/2016 for back pain and leg pain. She was found to have a urinary tract infection. She had a CT lumbar spine without contrast on 10/09/2016 which I reviewed: IMPRESSION: Aortic atherosclerosis.   Multilevel degenerative disc disease is noted, most prominently seen at L5-S1.   Moderate compression deformity of L1 vertebral body is noted most consistent with old fracture. No definite evidence of acute  fracture is noted.  Previously:    09/25/15, at which time she felt she was doing well. She was worried about her brother in [place]. She was not able to travel to [place] and was sad about it. She was planning to fly to [place] to be with her daughter and other family for some time. No recent seizures or strokelike symptoms were reported. Tremor was stable. She was using a cane and had no recent falls. I suggested we continue with Keppra, Plavix, gabapentin and Mysoline at the same doses.    I saw her on 04/26/2015 at which time she reported difficulty with her balance. Her tremor had become a little worse. She had had no recent seizures or auras. She was able to tolerate Keppra 500 mg twice daily. She was on the same dose of Mysoline for her tremors. She had benefited from physical therapy at [facility] and requested to go back for outpatient physical therapy. She had not fallen. She was using a 4 pronged cane and was no longer using her walker.   I saw her on 11/16/2014, at which time her son reported that she was doing well. She was in outpatient physical therapy and was using a 2 wheeled walker. She was taking gabapentin at a lower dose. Lowering gabapentin had improved her heavy headedness in the morning. Her tremor was stable. She was tolerating Keppra 500 mg twice daily with no recent seizures or auras reported. Overall she has done well. I continued her on the same dose of primidone, the same dose of Keppra, and suggested we reduce  her gabapentin further to 100 mg in the morning and 200 mg in the evening.    Of note, they canceled an appointment for 03/17/2015.   I saw her on 08/19/2014 at which time she presented after her hospitalization for stroke. Her son reported that [name] was causing her headaches and she was switched to Plavix. We talked about sleep apnea screening. She had no witnessed apneas and only mild snoring. She had done fairly well. They requested a referral to  outpatient therapy in [facility]. She was placed on Keppra for episodic twitching. She had had some medication changes when she was hospitalized for her stroke. She was advised to use her 2 wheeled walker with assistance. I referred her to physical therapy outpatient. We talked about secondary stroke prevention. I suggested she reduce gabapentin to 200 mg twice daily down from 300 mg twice daily because of dizziness and head heaviness reported. This continued on Mysoline for her essential tremor.   I saw her on 10/27/2013 at which time she presented for followup of her long-standing history of essential tremor. I felt she was stable in that regard and felt that her diabetes had resulted in diabetic neuropathy. I asked her to continue with Mysoline at the same strength and increased her gabapentin to 3 pills at night for a total of 300 mg each bedtime.   In the interim she presented to the emergency room on 07/18/2014 with new onset right-sided weakness, facial droop, slurring of speech which started around 6 PM the night before. She was in [name] visiting family when she had acute onset of the symptoms. She was seen in [place] at the hospital and CT head was negative for bleed but she did not receive TPA because of low platelet count of 90,000. She had improvement of her symptoms but persistent deficits. She was admitted to the hospital and had a complete stroke workup. She had an MRI of the brain as well as MRA head and was found to have a left thalamic ischemic stroke. Neurology was consulted. MRA showed left PCA occlusion. Echocardiogram showed an EF of 60-65% with no wall motion abnormalities and no source of emboli. Carotid Doppler studies were negative for any significant ICA stenosis (1-39%). Hb A1c was 6. LDL was 62. She was placed on aspirin for secondary stroke prevention and then changed to [name]. Blood work showed normal B12 levels, negative hepatitis serology, chest x-ray  was negative. EKG did not show any ST abnormalities. She had some left leg pain. Lower extremity Doppler studies from 07/20/2014 were negative for DVT. She was discharged on 07/21/2014 to inpatient rehabilitation. She was discharged from rehabilitation on 08/02/2014. I reviewed hospital records as well as imaging tests. Her daughter called and asked for a sooner than scheduled appointment because of worsening weakness. However, she missed an appointment on 08/05/2014 because she was readmitted to the hospital on 08/04/2014 for slurring of speech and shaking spells. I reviewed the hospital records. She had an EEG, which was negative for seizure activity but she was felt to have partial onset seizures and was started on Keppra which is currently at 500 mg twice daily. She was changed from [name] to Plavix 75 mg.   She had a brain MRI without contrast on 08/05/2014: 1. Persistent 1.1 cm focus of linear restricted diffusion within the lateral left thalamus near the posterior limb of the left internal capsule. Finding is favored to reflect resolving infarct from the 07/19/2014 study given the relative decreased  signal intensity and size from that prior exam, although possible acute re-infarction of this area is not entirely excluded. 2. No other acute intracranial abnormality. In addition, I personally reviewed the images through the PACS system. She had an EEG on 08/06/2014: This was reported as normal in the asleep and awake states. Her son called on 08/15/2014 to make an appointment.   I first met her on 01/19/2013, at which time I increased her Mysoline 250 mg strength 1-1/2 pills to 2 pills daily. I did advise her of the potential increase risk of side effects. She previously followed with Dr. Morene Antu. She has a billateral UE and head tremor for over 25 years. She was tried on primidone while she was in [place] with improvement in tremor. It was discontinued when she came to the [place] because she was  on Coumadin. Her tremor affects her ability to feed herself, write, and put on her makeup and is worse with anxiety. Her daughter has a tremor as well. She was restarted on primidone by Dr. Erling Cruz in 3/12. She is no longer on coumadin. She was on coumadin for a history of DVT in her left leg following left foot injury. She has had numbness in her legs without pain. EMG/NCV 02/27/11 was normal except for some denervation in the left lumbosacral paraspinal muscles. B12 level was normal. She is on gabapentin 100 mg 2 at night with relief of symptoms. Her major complaint is that of ongoing tremor, which is worse in the morning. She has not had side effects such as grogginess or difficulty walking with the primidone. She no longer cooks. Her tremor involves her writing and abilities to feed herself. She also continues to take gabapentin 128m 2 qHS for her neuropathy. She has not fallen per son. She was supposed to come back in 3 months but missed several appointments. She has been diagnosed with DM and has been started on metforim. She feels, her numbness and tingling is worse in her feet and she has now has started noticing burning sensation in her feet.     Her Past Medical History Is Significant For: Past Medical History:  Diagnosis Date  . Anxiety   . [facility] [facility] [place]   . Essential tremor 10/27/2013  . High cholesterol   . Hypertension   . Newly diagnosed diabetes ([facility] 10/27/2013  . Obesity, unspecified 10/27/2013  . Pneumonia   . Unspecified hereditary and idiopathic peripheral neuropathy 10/27/2013    Her Past Surgical History Is Significant For: Past Surgical History:  Procedure Laterality Date  . CATARACT EXTRACTION Bilateral   . FOOT SURGERY      Her Family History Is Significant For: Family History  Problem Relation Age of Onset  . Stroke Mother   . Heart Problems Brother   . Coronary artery disease Other   . Heart Problems Sister     Her Social History Is Significant  For: Social History   Social History  . Marital status: Widowed    Spouse name: N/A  . Number of children: 5  . Years of education: 9   Occupational History  .      does not work   Social History Main Topics  . Smoking status: Never Smoker  . Smokeless tobacco: Never Used  . Alcohol use No  . Drug use: No  . Sexual activity: No   Other Topics Concern  . None   Social History Narrative   Patient is right handed and resides  with son    Her Allergies Are:  Allergies  Allergen Reactions  . Tape Rash    Please do not use Plastic Tape  :   Her Current Medications Are:  Outpatient Encounter Prescriptions as of 11/11/2016  Medication Sig  . ACCU-CHEK AVIVA PLUS test strip   . ACCU-CHEK SOFTCLIX LANCETS lancets   . acetaminophen (TYLENOL) 500 MG tablet Take 500 mg by mouth every 6 (six) hours as needed (pain).  [name] ALPRAZolam (XANAX) 0.25 MG tablet Take 1 tablet (0.25 mg total) by mouth daily as needed for anxiety.  [name] amLODipine (NORVASC) 2.5 MG tablet Take 2.5 mg by mouth daily.  [name] atorvastatin (LIPITOR) 20 MG tablet Take 20 mg by mouth every evening.   [name] CALCIUM PO Take 1 tablet by mouth daily.  . Cholecalciferol (VITAMIN D PO) Take 1 tablet by mouth daily.  . clopidogrel (PLAVIX) 75 MG tablet Take 1 tablet (75 mg total) by mouth daily.  . cyclobenzaprine (FLEXERIL) 5 MG tablet Take 1 tablet (5 mg total) by mouth 2 (two) times daily as needed for muscle spasms.  [name] gabapentin (NEURONTIN) 100 MG capsule Take 1 capsule (100 mg total) by mouth 2 (two) times daily.  [name] levETIRAcetam (KEPPRA) 500 MG tablet Take 1 tablet (500 mg total) by mouth 2 (two) times daily.  . meloxicam (MOBIC) 7.5 MG tablet Take 1 tablet (7.5 mg total) by mouth daily as needed for pain.  . primidone (MYSOLINE) 250 MG tablet TAKE 1 TABLET (250 MG TOTAL) BY MOUTH 2 (TWO) TIMES DAILY.  [name] RESTASIS 0.05 % ophthalmic emulsion Place 1 drop into both eyes 2 (two) times daily.   . [DISCONTINUED] polyethylene glycol  (MIRALAX / GLYCOLAX) packet DISSOLVE 1 PACKAGE (17 G) IN 4 TO 8 OUNCES OF LIQUID AND DRINK 2 TIMES DAILY.   No facility-administered encounter medications on file as of 11/11/2016.   :  Review of Systems:  Out of a complete 14 point review of systems, all are reviewed and negative with the exception of these symptoms as listed below:      Review of Systems  Neurological:       Pt presents today to follow up on her tremor. Pt reports that her leg tremors are worse. Pt is tolerating the primidone well.    Objective:  Neurologic Exam  Physical Exam Physical Examination:   Vitals:   11/11/16 1142  BP: 126/73  Pulse: 67  Resp: 20   General Examination: The patient is a very pleasant 82 y.o. female in no acute distress. She appears well-developed and well-nourished and well groomed.    HEENT: Normocephalic, atraumatic, pupils are equal, round and reactive to light and accommodation. Hearing is grossly intact. Extraocular tracking is good without nystagmus. She has a moderate degree of head tremor, no-no type, mildly worse. She has a mild degree of voice tremor. She is not dysarthric. Oropharynx is clear. She has mild airway crowding. Neck is otherwise supple with full range of motion.   Chest: Clear to auscultation without wheezing, rhonchi or crackles noted.  Heart: S1+S2+0, regular and normal without murmurs, rubs or gallops noted.   Abdomen: Soft, non-tender and non-distended with normal bowel sounds appreciated on auscultation.  Extremities: There is no pitting edema in the distal lower extremities bilaterally. Pedal pulses are intact.  Skin: Warm and dry without trophic changes noted. There are no varicose veins.  Musculoskeletal: exam reveals no obvious joint deformities, tenderness or joint swelling or erythema, with the exception of left-sided low  back pain in the midline reported without radiation, same or mildly improved from last visit.  Neurologically:  Mental  status: The patient is awake, alert and oriented in all 4 spheres. His memory, attention, language and knowledge are appropriate. There is no aphasia, agnosia, apraxia or anomia. Speech is clear with normal prosody and enunciation. Thought process is linear. Mood is congruent and affect is normal.  Cranial nerves are as described above under HEENT exam. In addition, shoulder shrug is normal with equal shoulder height noted. Motor exam: Normal bulk, strength and tone is noted on the left side. On the right side she has minimal residual weakness, in her right grip and hip flexion primarily, stable from before. There is no drift, but she does have a slight resting tremor bilaterally. She has a moderate bilateral upper extremity postural and action tremor. There is no rebound, tremor stable. Romberg is not testable safely. Reflexes are 1+ throughout. Fine motor skills are mildly impaired bilaterally in the upper and lower extremities. Cerebellar testing shows no dysmetria or intention tremor on finger to nose testing. Heel to shin is difficult for her. There is no truncal or gait ataxia.  Sensory exam: intact to light touch but decreased in the lower extremities bilaterally and mild decrease in temperature and pinprick sensation in her right arm.   Gait, station and balance: She stands up slowly with assistance and uses her 4-prong cane in the left hand. She walks slowly and has a very slight limp on the right side, all stable. She turns cautiously. Tandem walk is not possible, balance is impaired.   Assessment and Plan:   In summary, Shatyra B Yeats is a very pleasant 82 year old female with a history of obesity, previous DVT, ET, stroke, partial seizures about about 15 months ago, who presents for FU of her ET, Stroke and Sz d/o. She has done well overall and is mostly stable. She completed outpatient therapy and was doing rather well when she visited her daughter in [place] and then her granddaughter in  [place], but had a set back d/t a fall in [place] and Had exacerbation of her back pain. She continues to have low back pain. Tremor-wise, she has had reasonably good results with Mysoline twice daily. She is also on low-dose Keppra twice daily. Residual weakness is about the same. She did not need any refills on her prescriptions for Keppra, Plavix, Mysoline and Neurontin today. She is advised to use her walker at all times and particularly turns slowly.  I suggested a follow-up in 6 months, sooner as needed, she can see one of our nurse practitioners at the time and I will see her back after that. We will do blood work to monitor her liver and kidney function at this time we will call him with the test results. I discussed this with the patient and her son. I answered all her questions today and they were in agreement.  I spent 20 minutes in total face-to-face time with the patient, more than 50% of which was spent in counseling and coordination of care, reviewing test results, reviewing medication and discussing or reviewing the diagnosis of ET, stroke, sz, the prognosis and treatment options.

## 2016-11-11 NOTE — Patient Instructions (Signed)
We will check blood work today and call you with the test results. We are looking at liver function.  We will keep the medications the same.

## 2016-11-12 ENCOUNTER — Telehealth: Payer: Self-pay

## 2016-11-12 LAB — COMPREHENSIVE METABOLIC PANEL
A/G RATIO: 1.6 (ref 1.2–2.2)
ALT: 19 IU/L (ref 0–32)
AST: 23 IU/L (ref 0–40)
Albumin: 4.1 g/dL (ref 3.5–4.7)
Alkaline Phosphatase: 76 IU/L (ref 39–117)
BILIRUBIN TOTAL: 0.3 mg/dL (ref 0.0–1.2)
BUN/Creatinine Ratio: 16 (ref 12–28)
BUN: 12 mg/dL (ref 8–27)
CHLORIDE: 102 mmol/L (ref 96–106)
CO2: 27 mmol/L (ref 18–29)
Calcium: 8.9 mg/dL (ref 8.7–10.3)
Creatinine, Ser: 0.75 mg/dL (ref 0.57–1.00)
GFR calc Af Amer: 85 mL/min/{1.73_m2} (ref >59)
GFR calc non Af Amer: 74 mL/min/{1.73_m2} (ref >59)
Globulin, Total: 2.6 g/dL (ref 1.5–4.5)
Glucose: 71 mg/dL (ref 65–99)
POTASSIUM: 4.6 mmol/L (ref 3.5–5.2)
Sodium: 142 mmol/L (ref 134–144)
TOTAL PROTEIN: 6.7 g/dL (ref 6.0–8.5)

## 2016-11-12 NOTE — Progress Notes (Signed)
Please call and advise the patient that the recent labs we checked were within normal limits. Best to call son. In particular, liver and kidney function good. No further action is required on these tests at this time. Thanks,  Huston FoleySaima Kenniya Westrich, MD, PhD

## 2016-11-12 NOTE — Telephone Encounter (Signed)
I called pt/pt's son to discuss her labs. No answer, left a message asking pt to call me back.

## 2016-11-12 NOTE — Telephone Encounter (Signed)
-----   Message from Huston FoleySaima Athar, MD sent at 11/12/2016  8:04 AM EST ----- Please call and advise the patient that the recent labs we checked were within normal limits. Best to call son. In particular, liver and kidney function good. No further action is required on these tests at this time. Thanks,  Huston FoleySaima Athar, MD, PhD

## 2016-11-12 NOTE — Telephone Encounter (Signed)
Per Dr. Teofilo PodAthar's previous message I advised patient's son that recent labs were within normal limits.

## 2016-11-14 ENCOUNTER — Encounter: Payer: Self-pay | Admitting: Family Medicine

## 2016-12-11 ENCOUNTER — Ambulatory Visit: Payer: Medicare Other | Admitting: Family Medicine

## 2016-12-17 ENCOUNTER — Ambulatory Visit (INDEPENDENT_AMBULATORY_CARE_PROVIDER_SITE_OTHER): Payer: Medicare Other | Admitting: Family Medicine

## 2016-12-17 ENCOUNTER — Encounter: Payer: Self-pay | Admitting: Family Medicine

## 2016-12-17 VITALS — BP 110/70 | HR 79 | Temp 97.3°F | Wt 154.0 lb

## 2016-12-17 DIAGNOSIS — M545 Low back pain, unspecified: Secondary | ICD-10-CM

## 2016-12-17 DIAGNOSIS — E118 Type 2 diabetes mellitus with unspecified complications: Secondary | ICD-10-CM

## 2016-12-17 DIAGNOSIS — G8929 Other chronic pain: Secondary | ICD-10-CM

## 2016-12-17 DIAGNOSIS — R569 Unspecified convulsions: Secondary | ICD-10-CM

## 2016-12-17 DIAGNOSIS — E785 Hyperlipidemia, unspecified: Secondary | ICD-10-CM | POA: Diagnosis not present

## 2016-12-17 DIAGNOSIS — I10 Essential (primary) hypertension: Secondary | ICD-10-CM

## 2016-12-17 LAB — POCT GLYCOSYLATED HEMOGLOBIN (HGB A1C): HEMOGLOBIN A1C: 5.2

## 2016-12-17 MED ORDER — DICLOFENAC SODIUM 1 % TD GEL: 2.0000 g | Freq: Four times a day (QID) | TRANSDERMAL | 0 refills | Status: DC

## 2016-12-17 NOTE — Patient Instructions (Signed)
It was a pleasure to meet you! You do not need blood pressure medicine (norvasc/amlodipine) or metformin anymore. You can try the voltaren gel for your back pain, but let me know if it is too expensive.

## 2016-12-17 NOTE — Assessment & Plan Note (Addendum)
Tried family member's voltaren gel. This was helpful for her. Denies alarm symptoms. Will give short course of this. Creatinine WNL.

## 2016-12-17 NOTE — Assessment & Plan Note (Signed)
Obtained HgbA1C today as patient has been taking metformin 500mg  and checking fasting CBGs in am religiously. Last HgbA1c 2 years ago. HgbA1C today is 5.2. Discontinued metformin as risk of hypoglycemia is greater than prevention of complications of DM at this point. Instructed patient to discontinue checking CBGs.

## 2016-12-17 NOTE — Assessment & Plan Note (Signed)
Taking atorvastatin 20mg  (would be high dose given hx of CVA, but age >75 years dosing is 20mg ). Tolerating well, instructed to continue. Given hx of CVA, I did not check lipid profile and she is a candidate for lifetime statin.

## 2016-12-17 NOTE — Progress Notes (Cosign Needed)
   CC: new patient  HPI New patient, she speaks [place], her son is her caretaker and wants to interpret for her. She has intermittent back pain after a fall and resulting finding of old L1 fracture as well as moderate disc space narrowing L5-S1 (ED 10/09/16). Her family member has been using voltaren gel, and shared this with her. She would like an rx for this for her back pain.   DM - patient has been checking fasting CBGs every morning. Results running <100. Takes metformin 500mg .  HLD - takes atorvastatin 20mg  daily.  HTN - doesn't take norvasc anymore. Regularly checks her BP at home, usually SBP 110-130.   CC, SH/smoking status, and VS noted  Objective: BP 110/70 (BP Location: Right Arm, Patient Position: Sitting, Cuff Size: Small)   Pulse 79   Temp 97.3 F (36.3 C) (Oral)   Wt 69.9 kg (154 lb)   SpO2 96%   BMI 30.08 kg/m  Gen: NAD, alert, cooperative, and pleasant overweight elderly female. Uses cane.  HEENT: NCAT, EOMI, PERRL CV: RRR, no murmur Resp: CTAB, no wheezes, non-laboredDictation #1 [id number]  [id number]  Abd: SNTND, BS present, no guarding or organomegaly Ext: No edema, warm Neuro: Alert and oriented, Speech clear, No gross deficits  Assessment and plan:  HTN (hypertension) BP 110/70, has not been taking norvasc. I am more worried about hypotension than lasting effects of HTN. Will discontinue any BP meds.   DM2 (diabetes mellitus, type 2) Obtained HgbA1C today as patient has been taking metformin 500mg  and checking fasting CBGs in am religiously. Last HgbA1c 2 years ago. HgbA1C today is 5.2. Discontinued metformin as risk of hypoglycemia is greater than prevention of complications of DM at this point. Instructed patient to discontinue checking CBGs.  HLD (hyperlipidemia) Taking atorvastatin 20mg  (would be high dose given hx of CVA, but age >75 years dosing is 20mg ). Tolerating well, instructed to continue. Given hx of CVA, I did not check lipid profile  and she is a candidate for lifetime statin.   Seizures Started on Keppra during hospitalization in 07/2014 after her stroke where some shaking spells were witnessed. Had EEG without seizure activity at that time, but felt to be possibly partial seizures. Keppra has been continued by neurology.   Lumbar pain Tried family member's voltaren gel. This was helpful for her. Denies alarm symptoms. Will give short course of this. Creatinine WNL.    Orders Placed This Encounter  Procedures  . HgB A1c    Meds ordered this encounter  Medications  . diclofenac sodium (VOLTAREN) 1 % GEL    Sig: Apply 2 g topically 4 (four) times daily.    Dispense:  1 Tube    Refill:  0     Loni MuseKate Lanie Schelling, MD, PGY1 12/17/2016 4:36 PM

## 2016-12-17 NOTE — Assessment & Plan Note (Signed)
BP 110/70, has not been taking norvasc. I am more worried about hypotension than lasting effects of HTN. Will discontinue any BP meds.

## 2016-12-17 NOTE — Assessment & Plan Note (Signed)
Started on Keppra during hospitalization in 07/2014 after her stroke where some shaking spells were witnessed. Had EEG without seizure activity at that time, but felt to be possibly partial seizures. Keppra has been continued by neurology.

## 2017-02-09 ENCOUNTER — Other Ambulatory Visit: Payer: Self-pay | Admitting: Family Medicine

## 2017-02-13 ENCOUNTER — Telehealth: Payer: Self-pay | Admitting: Neurology

## 2017-02-13 NOTE — Telephone Encounter (Signed)
Pt son Odis HollingsheadBahram Zargham (Son) called stating that Social Security office has sent a letter stating that they need a letter from Pt Dr. office stating that Pt is unable to care for herself due to stroke (no DPR on file but son is listed on emergency contact).

## 2017-02-13 NOTE — Telephone Encounter (Signed)
Patient unable to care for herself due to history of stroke with residual right-sided weakness, history of seizure, long-standing history of essential tremor. She requires long-term medications and regular follow-up appointments. Please provide letter as requested by son.

## 2017-02-13 NOTE — Telephone Encounter (Signed)
Ok for letter

## 2017-02-19 NOTE — Telephone Encounter (Signed)
LM for Son that we have written the letter and it is at the front desk ready for pick up.

## 2017-03-05 ENCOUNTER — Other Ambulatory Visit: Payer: Self-pay | Admitting: Family Medicine

## 2017-03-26 ENCOUNTER — Other Ambulatory Visit: Payer: Self-pay | Admitting: Neurology

## 2017-03-26 DIAGNOSIS — G40109 Localization-related (focal) (partial) symptomatic epilepsy and epileptic syndromes with simple partial seizures, not intractable, without status epilepticus: Secondary | ICD-10-CM

## 2017-03-28 ENCOUNTER — Telehealth: Payer: Self-pay | Admitting: Rheumatology

## 2017-03-28 NOTE — Telephone Encounter (Signed)
Son calling to request prior authrization to be obtained for Gel injections for mothers knee. Please call when approved, and okay to sche a rov.

## 2017-03-31 ENCOUNTER — Other Ambulatory Visit: Payer: Self-pay | Admitting: Family Medicine

## 2017-04-02 NOTE — Telephone Encounter (Signed)
new

## 2017-04-14 ENCOUNTER — Telehealth (INDEPENDENT_AMBULATORY_CARE_PROVIDER_SITE_OTHER): Payer: Self-pay | Admitting: Rheumatology

## 2017-04-14 NOTE — Telephone Encounter (Signed)
Patient doesn't need knee injections, she needs an infusion. Son states patient is past due. Please call son with information.

## 2017-04-17 NOTE — Telephone Encounter (Signed)
Patient has not been seen in the office in one year. She will need appointment to discuss Visco supplement injections. Any of the patient's would not been seen in the office within a year will need appointment before getting Visco supplement injections

## 2017-04-17 NOTE — Telephone Encounter (Signed)
See notes, I looked back in [facility] as well, cannot find any previous series injections, please advise on applying for gel injections?  Thanks-

## 2017-04-23 NOTE — Telephone Encounter (Signed)
Patient is due for a follow up appointment. Patient has not been since 04/2016.

## 2017-04-23 NOTE — Telephone Encounter (Signed)
Scheduled patient a rov with son to discuss infusion.

## 2017-04-24 ENCOUNTER — Other Ambulatory Visit: Payer: Self-pay | Admitting: Family Medicine

## 2017-04-30 NOTE — Telephone Encounter (Signed)
Can you call patient and advise?  And sched ROV?  thanks

## 2017-05-05 ENCOUNTER — Other Ambulatory Visit: Payer: Self-pay | Admitting: Neurology

## 2017-05-05 DIAGNOSIS — G250 Essential tremor: Secondary | ICD-10-CM

## 2017-05-13 ENCOUNTER — Encounter: Payer: Self-pay | Admitting: Adult Health

## 2017-05-13 ENCOUNTER — Ambulatory Visit (INDEPENDENT_AMBULATORY_CARE_PROVIDER_SITE_OTHER): Payer: Medicare Other | Admitting: Adult Health

## 2017-05-13 VITALS — BP 131/70 | HR 76 | Wt 156.6 lb

## 2017-05-13 DIAGNOSIS — G250 Essential tremor: Secondary | ICD-10-CM | POA: Diagnosis not present

## 2017-05-13 DIAGNOSIS — Z8673 Personal history of transient ischemic attack (TIA), and cerebral infarction without residual deficits: Secondary | ICD-10-CM | POA: Diagnosis not present

## 2017-05-13 DIAGNOSIS — R569 Unspecified convulsions: Secondary | ICD-10-CM

## 2017-05-13 NOTE — Patient Instructions (Signed)
Continue Keppra, mysoline and plavix Ok to take trip overseas If your symptoms worsen or you develop new symptoms please let us know.

## 2017-05-13 NOTE — Progress Notes (Addendum)
PATIENT: [name] [name] DOB: 08-Nov-1930  REASON FOR VISIT: follow up- essential tremor, stroke, seizures HISTORY FROM: patient and son   HISTORY OF PRESENT ILLNESS: [name] [name] is an 83 year old female with a history of stroke, essential tremor and seizures. She returns today for follow-up. She is with [name] son. They report that everything has remained stable. No change in [name] tremor. She still gets good benefit from [name] [name]. She remains on [name] [name] for stroke prevention. Blood pressure is in normal range today. She continues on [name] [name] for seizure prevention. Denies any seizures. She is able to complete all ADLs independently. She plans to travel overseas within the next month. She returns today for an evaluation.  HISTORY copied from [name] [name] notes:  [name] [name] is a very pleasant 83 year old Middle Eastern/[place]ian lady with an underlying medical history of hypertension, hyperlipidemia, DVT, pneumonia, anxiety, obesity, diabetes, peripheral neuropathy, stroke and essential tremor, who presents for follow-up consultation of [name] essential tremor and [name] history of stroke. She is accompanied by [name] son again today. I last saw [name] on 05/08/2016, at which time she had recently fallen about 10 days prior. She had visited [name] [name] who is expecting a baby and fell backwards landed on [name] back. She had left-sided back pain since then. She went to the emergency room on 05/04/2016 and was given an injection with Decadron, Toradol, Dilaudid, but did not have much in way of relief. She had a bone scan on 05/06/16, which I reviewed: IMPRESSION: 1. No evidence of a recent fracture. 2. No evidence of neoplastic disease to bone. 3. Significant degenerative uptake noted along spine as well as involving multiple bilateral joints. She reported that [name] tremor was worse. She had more stress and also more pain.  I suggested she continue with [name] [name], [name], [name] and [name].  Today,  11/11/2016: She reports no recent fall, tremor mostly stable, was told to avoid tylenol for abn. LFTs. She uses a stationary bike for exercise some, helps with LBP, which is about the same, R leg weakness is about the same. Of note, she went to the emergency room on 10/09/2016 for back pain and leg pain. She was found to have a urinary tract infection. She had a CT lumbar spine without contrast on 10/09/2016 which I reviewed: IMPRESSION: Aortic atherosclerosis.  Multilevel degenerative disc disease is noted, most prominently seen at [name].  Moderate compression deformity of [name] [name] vertebral body is noted most consistent with old fracture. No definite evidence of acute fracture is noted.  Previously:   09/25/15, at which time she felt she was doing well. She was worried about [name] brother in [place]. She was not able to travel to [place] and was sad about it. She was planning to fly to [place] to be with [name] [name] and other family for some time. No recent seizures or strokelike symptoms were reported. Tremor was stable. She was using a cane and had no recent falls. I suggested we continue with [name] [name], [name], gabapentin and [name] [name] at the same doses.  I saw [name] on 04/26/2015 at which time she reported difficulty with [name] balance. [name] tremor had become a little worse. She had had no recent seizures or auras. She was able to tolerate [name] [name] 500 mg twice daily. She was on the same dose of [name] [name] for [name] tremors. She had benefited from physical therapy at [name] and requested to go back for outpatient physical therapy. She had not fallen. She was using a 4  pronged cane and was no longer using [name] walker.  I saw [name] on 11/16/2014, at which time [name] son reported that she was doing well. She was in outpatient physical therapy and was using a 2 wheeled walker. She was taking gabapentin at a lower dose. Lowering gabapentin had improved [name] heavy headedness in the morning. [name] tremor was  stable. She was tolerating [name] [name] 500 mg twice daily with no recent seizures or auras reported. Overall she has done well. I continued [name] on the same dose of primidone, the same dose of [name] [name], and suggested we reduce [name] gabapentin further to 100 mg in the morning and 200 mg in the evening.   Of note, they canceled an appointment for 03/17/2015.  I saw [name] on 08/19/2014 at which time she presented after [name] hospitalization for stroke. [name] son reported that [name] [name] was causing [name] headaches and she was switched to [name] [name]. We talked about sleep apnea screening. She had no witnessed apneas and only mild snoring. She had done fairly well. They requested a referral to outpatient therapy in [name]. She was placed on [name] [name] for episodic twitching. She had had some medication changes when she was hospitalized for [name] stroke. She was advised to use [name] 2 wheeled walker with assistance. I referred [name] to physical therapy outpatient. We talked about secondary stroke prevention. I suggested she reduce gabapentin to 200 mg twice daily down from 300 mg twice daily because of dizziness and head heaviness reported. This continued on [name] [name] for [name] essential tremor.  I saw [name] on 10/27/2013 at which time she presented for followup of [name] long-standing history of essential tremor. I felt she was stable in that regard and felt that [name] diabetes had resulted in diabetic neuropathy. I asked [name] to continue with [name] [name] at the same strength and increased [name] gabapentin to 3 pills at night for a total of 300 mg each bedtime.  In the interim she presented to the emergency room on 07/18/2014 with new onset right-sided weakness, facial droop, slurring of speech which started around 6 PM the night before. She was in [name], [place] visiting family when she had acute onset of the symptoms. She was seen in [place] at [name] and CT head was negative for bleed but she did not receive TPA because of low  platelet count of 90,000. She had improvement of [name] symptoms but persistent deficits. She was admitted to the [name] and had a complete stroke workup. She had an MRI of the brain as well as MRA head and was found to have a left thalamic ischemic stroke. Neurology was consulted. MRA showed left PCA occlusion. Echocardiogram showed an EF of 60-65% with no wall motion abnormalities and no source of emboli. Carotid Doppler studies were negative for any significant ICA stenosis (1-39%). Hb A1c was 6. LDL was 62. She was placed on aspirin for secondary stroke prevention and then changed to [name] [name]. Blood work showed normal B12 levels, negative hepatitis serology, chest x-ray was negative. EKG did not show any ST abnormalities. She had some left leg pain. Lower extremity Doppler studies from 07/20/2014 were negative for DVT. She was discharged on 07/21/2014 to inpatient rehabilitation. She was discharged from rehabilitation on 08/02/2014. I reviewed [name] records as well as imaging tests. [name] [name] called and asked for a sooner than scheduled appointment because of worsening weakness. However, she missed an appointment on 08/05/2014 because she was readmitted to the [name] on 08/04/2014 for slurring of speech and shaking spells.  I reviewed the [name] records. She had an EEG, which was negative for seizure activity but she was felt to have partial onset seizures and was started on [name] [name] which is currently at 500 mg twice daily. She was changed from [name] [name] to [name] [name] 75 mg.  She had a brain MRI without contrast on 08/05/2014: 1. Persistent 1.1 cm focus of linear restricted diffusion within the lateral left thalamus near the posterior limb of the left internal capsule. Finding is favored to reflect resolving infarct from the 07/19/2014 study given the relative decreased signal intensity and size from that prior exam, although possible acute re-infarction of this area is not entirely excluded. 2. No other  acute intracranial abnormality. In addition, I personally reviewed the images through the PACS system. She had an EEG on 08/06/2014: This was reported as normal in the asleep and awake states. [name] son called on 08/15/2014 to make an appointment.  I first met [name] on 01/19/2013, at which time I increased [name] [name] [name] 250 mg strength 1-1/2 pills to 2 pills daily. I did advise [name] of the potential increase risk of side effects. She previously followed with Dr. Morene Antu. She has a billateral UE and head tremor for over 25 years. She was tried on primidone while she was in [place] with improvement in tremor. It was discontinued when she came to the [place] because she was on Coumadin. [name] tremor affects [name] ability to feed herself, write, and put on [name] makeup and is worse with anxiety. [name] [name] has a tremor as well. She was restarted on primidone by Dr. Erling Cruz in 3/12. She is no longer on coumadin. She was on coumadin for a history of DVT in [name] left leg following left foot injury. She has had numbness in [name] legs without pain. EMG/NCV 02/27/11 was normal except for some denervation in the left lumbosacral paraspinal muscles. B12 level was normal. She is on gabapentin 100 mg 2 at night with relief of symptoms. [name] major complaint is that of ongoing tremor, which is worse in the morning. She has not had side effects such as grogginess or difficulty walking with the primidone. She no longer cooks. [name] tremor involves [name] writing and abilities to feed herself. She also continues to take gabapentin 161m 2 qHS for [name] neuropathy. She has not fallen per son. She was supposed to come back in 3 months but missed several appointments. She has been diagnosed with DM and has been started on metforim. She feels, [name] numbness and tingling is worse in [name] feet and she has now has started noticing burning sensation in [name] feet.  HISTORY   REVIEW OF SYSTEMS: Out of a complete 14 system review of symptoms, the patient  complains only of the following symptoms, and all other reviewed systems are negative.  See history of present illness  ALLERGIES: Allergies  Allergen Reactions  . Tape Rash    Please do not use Plastic Tape    HOME MEDICATIONS: Outpatient Medications Prior to Visit  Medication Sig Dispense Refill  . atorvastatin (LIPITOR) 20 MG tablet Take 20 mg by mouth every evening.     .Marland KitchenCALCIUM PO Take 1 tablet by mouth daily.    . Cholecalciferol (VITAMIN D PO) Take 1 tablet by mouth daily.    . clopidogrel ([name] [name]) 75 MG tablet Take 1 tablet (75 mg total) by mouth daily. 90 tablet 3  . gabapentin ([name] [name]) 100 MG capsule Take 1 capsule (100 mg total) by mouth 2 (two) times  daily. 180 capsule 3  . levETIRAcetam ([name] [name]) 500 MG tablet TAKE 1 TABLET (500 MG TOTAL) BY MOUTH 2 (TWO) TIMES DAILY. 180 tablet 0  . primidone ([name] [name]) 250 MG tablet TAKE 1 TABLET (250 MG TOTAL) BY MOUTH 2 (TWO) TIMES DAILY. 180 tablet 3  . RESTASIS 0.05 % ophthalmic emulsion Place 1 drop into both eyes 2 (two) times daily.   4  . VOLTAREN 1 % GEL APPLY 2 G TOPICALLY 4 (FOUR) TIMES DAILY. 100 g 0   No facility-administered medications prior to visit.     PAST MEDICAL HISTORY: Past Medical History:  Diagnosis Date  . Anxiety   . Diabetes ([place]) 10/27/2013  . DVT (deep venous thrombosis) ([place])   . Essential tremor 10/27/2013  . High cholesterol 1999  . Hypertension   . Ischemic stroke ([place]) 07/18/2014  . Obesity, unspecified 10/27/2013  . Pneumonia   . Unspecified hereditary and idiopathic peripheral neuropathy 10/27/2013    PAST SURGICAL HISTORY: Past Surgical History:  Procedure Laterality Date  . CATARACT EXTRACTION Bilateral   . FOOT SURGERY      FAMILY HISTORY: Family History  Problem Relation Age of Onset  . Stroke Mother   . Heart Problems Brother   . Coronary artery disease Other   . Heart Problems Sister     SOCIAL HISTORY: Social History   Social History  . Marital status: Widowed     Spouse name: N/A  . Number of children: 5  . Years of education: 9   Occupational History  .      does not work   Social History Main Topics  . Smoking status: Never Smoker  . Smokeless tobacco: Never Used  . Alcohol use No  . Drug use: No  . Sexual activity: No   Other Topics Concern  . Not on file   Social History Narrative   Patient is right handed and resides with son      PHYSICAL EXAM  Vitals:   05/13/17 1139  BP: 131/70  Pulse: 76  Weight: 156 lb 9.6 oz (71 kg)   Body mass index is 30.58 kg/m.  Generalized: Well developed, in no acute distress   Neurological examination  Mentation: Alert oriented to time, place, history taking. Follows all commands speech and language fluent Cranial nerve II-XII: Pupils were equal round reactive to light. Extraocular movements were full, visual field were full on confrontational test. Facial sensation and strength were normal. Uvula tongue midline. Head turning and shoulder shrug  were normal and symmetric. Motor: The motor testing reveals 5 over 5 strength of all 4 extremities. Good symmetric motor tone is noted throughout.  Sensory: Sensory testing is intact to soft touch on all 4 extremities. No evidence of extinction is noted.  Coordination: Cerebellar testing reveals good finger-nose-finger and heel-to-shin bilaterally.  Gait and station:Gait is slightly unsteady. She uses a cane ambulating    DIAGNOSTIC DATA (LABS, IMAGING, TESTING) - I reviewed patient records, labs, notes, testing and imaging myself where available.  Lab Results  Component Value Date   WBC 4.2 10/09/2016   HGB 14.5 10/09/2016   HCT 43.1 10/09/2016   MCV 91.5 10/09/2016   PLT 120 (L) 10/09/2016      Component Value Date/Time   NA 142 11/11/2016 1250   K 4.6 11/11/2016 1250   CL 102 11/11/2016 1250   CO2 27 11/11/2016 1250   GLUCOSE 71 11/11/2016 1250   GLUCOSE 84 10/09/2016 1346   BUN 12 11/11/2016 1250  CREATININE 0.75 11/11/2016 1250     CALCIUM 8.9 11/11/2016 1250   PROT 6.7 11/11/2016 1250   ALBUMIN 4.1 11/11/2016 1250   AST 23 11/11/2016 1250   ALT 19 11/11/2016 1250   ALKPHOS 76 11/11/2016 1250   BILITOT 0.3 11/11/2016 1250   GFRNONAA 74 11/11/2016 1250   GFRAA 85 11/11/2016 1250   Lab Results  Component Value Date   CHOL 153 07/18/2014   HDL 75 07/18/2014   LDLCALC 62 07/18/2014   TRIG 78 07/18/2014   CHOLHDL 2.0 07/18/2014   Lab Results  Component Value Date   HGBA1C 5.2 12/17/2016   Lab Results  Component Value Date   VITAMINB12 1,082 (H) 07/18/2014   No results found for: TSH    ASSESSMENT AND PLAN 83 y.o. year old female  has a past medical history of Anxiety; Diabetes ([place]) (10/27/2013); DVT (deep venous thrombosis) ([place]); Essential tremor (10/27/2013); High cholesterol (1999); Hypertension; Ischemic stroke ([place]) (07/18/2014); Obesity, unspecified (10/27/2013); Pneumonia; and Unspecified hereditary and idiopathic peripheral neuropathy (10/27/2013). here with :  1. Essential tremor 2. History of stroke 3. Seizures  Overall the patient is doing well. She'll continue on [name] [name] for stroke prevention. She is encouraged to keep [name] blood pressure in good control. She will continue on [name] [name] for seizure prevention. She will remain on [name] [name] for essential tremor. I discussed with Dr. Rexene Alberts regarding [name] traveling overseas. The patient has no restrictions on traveling. She is encouraged to stay well-hydrated and try to ambulate as much as possible. The patient and [name] son voices understanding. She will follow-up in 6 months or sooner if needed.    Ward Givens, MSN, NP-C 05/13/2017, 12:04 PM [facility] [address], [place], [place] [address] [phone]  I reviewed the above note and documentation by the Nurse Practitioner and agree with the history, physical exam, assessment and plan as outlined above. I was immediately available for face-to-face consultation.I do not  see a neurological reason to restrict [name] travels so long as she does not travel alone, drinks plenty of fluid, tries to walk around safely when possible, takes [name] medication on time, and does not overexert.   Star Age, MD, PhD [facility])

## 2017-05-18 ENCOUNTER — Other Ambulatory Visit: Payer: Self-pay | Admitting: Neurology

## 2017-05-18 DIAGNOSIS — E785 Hyperlipidemia, unspecified: Secondary | ICD-10-CM

## 2017-05-18 DIAGNOSIS — R531 Weakness: Secondary | ICD-10-CM

## 2017-05-23 ENCOUNTER — Other Ambulatory Visit: Payer: Self-pay | Admitting: Neurology

## 2017-05-23 DIAGNOSIS — E0842 Diabetes mellitus due to underlying condition with diabetic polyneuropathy: Secondary | ICD-10-CM

## 2017-05-26 NOTE — Progress Notes (Signed)
Office Visit Note  Patient: [name] [name]             Date of Birth: Feb 07, 1930           MRN: [id number]             PCP: Garth Bigness, MD Referring: Garth Bigness, MD Visit Date: 05/29/2017 Occupation: @GUAROCC @    Subjective:  Left hip pain.   History of Present Illness: [name] [name] is a 83 y.o. female with history of osteoporosis and osteoarthritis. She's been having pain and discomfort in her left hip for the last 10 days. She's been having lower back pain as well. None of the other joints are painful.  Activities of Daily Living:  Patient reports morning stiffness for 10 minutes.   Patient Reports nocturnal pain.  Difficulty dressing/grooming: Reports Difficulty climbing stairs: Reports Difficulty getting out of chair: Denies Difficulty using hands for taps, buttons, cutlery, and/or writing: Reports   Review of Systems  Constitutional: Negative.  Negative for fatigue, night sweats, weight gain, weight loss and weakness.  HENT: Positive for mouth sores. Negative for trouble swallowing, trouble swallowing, mouth dryness and nose dryness.   Eyes: Negative for pain, redness, visual disturbance and dryness.  Respiratory: Negative.  Negative for cough, shortness of breath and difficulty breathing.   Cardiovascular: Negative.  Negative for chest pain, palpitations, hypertension, irregular heartbeat and swelling in legs/feet.  Gastrointestinal: Negative.  Negative for blood in stool, constipation and diarrhea.  Endocrine: Negative.  Negative for increased urination.  Genitourinary: Negative.  Negative for nocturia and vaginal dryness.  Musculoskeletal: Positive for arthralgias and joint pain. Negative for joint swelling, myalgias, muscle weakness, morning stiffness, muscle tenderness and myalgias.  Skin: Negative.  Negative for color change, rash, hair loss, skin tightness, ulcers and sensitivity to sunlight.  Allergic/Immunologic: Negative for susceptible  to infections.  Neurological: Negative.  Negative for dizziness, headaches, memory loss and night sweats.  Hematological: Negative.  Negative for swollen glands.  Psychiatric/Behavioral: Negative.  Negative for depressed mood and sleep disturbance. The patient is not nervous/anxious.     PMFS History:  Patient Active Problem List   Diagnosis Date Noted  . Lumbar pain 10/10/2016  . Pelvic pain 10/10/2016  . Seizures (HCC) 08/06/2014  . Abnormal involuntary movement 08/05/2014  . Hemiplegia, unspecified, affecting dominant side 08/05/2014  . Right sided weakness 08/05/2014  . CVA (cerebral vascular accident) (HCC) 07/21/2014  . Left leg pain 07/20/2014  . HTN (hypertension) 07/18/2014  . HLD (hyperlipidemia) 07/18/2014  . Thrombocytopenia (HCC) 07/18/2014  . Essential tremor 10/27/2013  . Obesity, unspecified 10/27/2013  . DM2 (diabetes mellitus, type 2) (HCC) 10/27/2013  . History of peripheral neuropathy 10/27/2013    Past Medical History:  Diagnosis Date  . Anxiety   . Diabetes (HCC) 10/27/2013  . DVT (deep venous thrombosis) (HCC)   . Essential tremor 10/27/2013  . High cholesterol 1999  . Hypertension   . Ischemic stroke (HCC) 07/18/2014  . Obesity, unspecified 10/27/2013  . Pneumonia   . Unspecified hereditary and idiopathic peripheral neuropathy 10/27/2013    Family History  Problem Relation Age of Onset  . Stroke Mother   . Heart Problems Brother   . Coronary artery disease Other   . Heart Problems Sister    Past Surgical History:  Procedure Laterality Date  . CATARACT EXTRACTION Bilateral   . FOOT SURGERY     Social History   Social History Narrative   Patient is right handed and resides with son  Objective: Vital Signs: BP (!) 145/60   Pulse 77   Resp 16   Ht 5' (1.524 m)   Wt 158 lb (71.7 kg)   BMI 30.86 kg/m    Physical Exam  Constitutional: She is oriented to person, place, and time. She appears well-developed and well-nourished.  HENT:    Head: Normocephalic and atraumatic.  Eyes: Conjunctivae and EOM are normal.  Neck: Normal range of motion.  Cardiovascular: Normal rate, regular rhythm, normal heart sounds and intact distal pulses.   Pulmonary/Chest: Effort normal and breath sounds normal.  Abdominal: Soft. Bowel sounds are normal.  Lymphadenopathy:    She has no cervical adenopathy.  Neurological: She is alert and oriented to person, place, and time.  Skin: Skin is warm and dry. Capillary refill takes less than 2 seconds.  Psychiatric: She has a normal mood and affect. Her behavior is normal.  Nursing note and vitals reviewed.    Musculoskeletal Exam: C-spine and thoracic lumbar spine limited range of motion. Thoracic kyphosis. She had discomfort range of motion of her lumbar spine. She also has scoliosis. She describes tenderness over left superior iliac spine and also left lumbar region. Shoulder joints elbow joints wrist joint MCPs PIPs with good range of motion. Hip joints knee joints ankles MTPs PIPs with good range of motion with no synovitis.  CDAI Exam: No CDAI exam completed.    Investigation: No additional findings.   Imaging: Xr Lumbar Spine 2-3 Views  Result Date: 05/29/2017 Multilevel spondylosis mild scoliosis she has L4-5 listhesis L1 vertebral fracture which is old. Impression: Multilevel spondylosis with L4-5 listhesis.  Xr Pelvis 1-2 Views  Result Date: 05/29/2017 Hip joints reveal mild superior lateral narrowing, no fracture was noted. Some osteoarthritic changes in her SI joints were noted.   Speciality Comments: No specialty comments available. CBC    Component Value Date/Time   WBC 4.2 10/09/2016 1346   RBC 4.71 10/09/2016 1346   HGB 14.5 10/09/2016 1346   HCT 43.1 10/09/2016 1346   PLT 120 (L) 10/09/2016 1346   MCV 91.5 10/09/2016 1346   MCH 30.8 10/09/2016 1346   MCHC 33.6 10/09/2016 1346   RDW 14.0 10/09/2016 1346   LYMPHSABS 1.8 10/09/2016 1346   MONOABS 0.3 10/09/2016 1346    EOSABS 0.1 10/09/2016 1346   BASOSABS 0.0 10/09/2016 1346   CMP     Component Value Date/Time   NA 142 11/11/2016 1250   K 4.6 11/11/2016 1250   CL 102 11/11/2016 1250   CO2 27 11/11/2016 1250   GLUCOSE 71 11/11/2016 1250   GLUCOSE 84 10/09/2016 1346   BUN 12 11/11/2016 1250   CREATININE 0.75 11/11/2016 1250   CALCIUM 8.9 11/11/2016 1250   PROT 6.7 11/11/2016 1250   ALBUMIN 4.1 11/11/2016 1250   AST 23 11/11/2016 1250   ALT 19 11/11/2016 1250   ALKPHOS 76 11/11/2016 1250   BILITOT 0.3 11/11/2016 1250   GFRNONAA 74 11/11/2016 1250   GFRAA 85 11/11/2016 1250     Procedures:  Large Joint Inj Date/Time: 05/29/2017 12:47 PM Performed by: Pollyann Savoy Authorized by: Pollyann Savoy   Consent Given by:  Patient Site marked: the procedure site was marked   Timeout: prior to procedure the correct patient, procedure, and site was verified   Indications:  Pain Location:  Hip Site:  L greater trochanter Prep: patient was prepped and draped in usual sterile fashion   Needle Size:  27 G Needle Length:  1.5 inches Approach:  Lateral Ultrasound Guidance:  No   Fluoroscopic Guidance: No   Arthrogram: No   Medications:  40 mg triamcinolone acetonide 40 MG/ML; 1.5 mL lidocaine 1 % Aspiration Attempted: No   Aspirate amount (mL):  0 Patient tolerance:  Patient tolerated the procedure well with no immediate complications   Allergies: Tape   Assessment / Plan:     Visit Diagnoses: Age-related osteoporosis with current pathological fracture with routine healing, subsequent encounter - DEXA 04/14/2015 T -2.7 L femoral neck, BMD 0.667 at [facility] We will schedule repeat bone density. We will contact her after bone density results. We will schedule next Reclast infusion based on her bone density results. She will need labs prior to her Reclast infusion. Which will include CBC and CMP and vitamin D.  History of vertebral fracture  Chronic bilateral low back pain without sciatica:  She has chronic lower back pain. X-ray of the lumbar spine was obtained today. Which shows multilevel spondylosis and L4-5 listhesis. I'm uncertain if it is the cause of her pain. She's been experiencing pain in her left trochanter and left gluteal region.  Pelvic pain: X-ray was unremarkable.  Vitamin D deficiency: She is on supplement.  High risk medication use - Reclast infusions/ most recent 05/2016. We planned repeat Reclast infusion after her DEXA scan.  Trochanteric bursitis of left hip: After different treatment options and side effects were discussed the left trochanteric area was prepped and still fashion and injected with cortisone as described above.  Primary osteoarthritis of both knees: Chronic pain  DDD (degenerative disc disease), cervical  DDD (degenerative disc disease), thoracic  DDD (degenerative disc disease), lumbar - Plan: XR Lumbar Spine 2-3 Views, XR Pelvis 1-2 Views  History of hypertension: Advised to monitor blood pressure  History of gastroesophageal reflux (GERD)  History of DVT (deep vein thrombosis)  History of pulmonary embolism  Primary insomnia  History of CVA (cerebrovascular accident)  History of peripheral neuropathy  History of diabetes mellitus    Orders: Orders Placed This Encounter  Procedures  . XR Lumbar Spine 2-3 Views  . XR Pelvis 1-2 Views   No orders of the defined types were placed in this encounter.   Face-to-face time spent with patient was 30 minutes. 50% of time was spent in counseling and coordination of care.  Follow-Up Instructions: Return in about 6 months (around 11/29/2017) for Osteoporosis.   Pollyann Savoy, MD  Note - This record has been created using Animal nutritionist.  Chart creation errors have been sought, but may not always  have been located. Such creation errors do not reflect on  the standard of medical care.

## 2017-05-29 ENCOUNTER — Ambulatory Visit (INDEPENDENT_AMBULATORY_CARE_PROVIDER_SITE_OTHER): Payer: Medicare Other | Admitting: Rheumatology

## 2017-05-29 ENCOUNTER — Encounter: Payer: Self-pay | Admitting: Rheumatology

## 2017-05-29 ENCOUNTER — Other Ambulatory Visit: Payer: Self-pay

## 2017-05-29 ENCOUNTER — Ambulatory Visit (INDEPENDENT_AMBULATORY_CARE_PROVIDER_SITE_OTHER): Payer: Medicare Other

## 2017-05-29 ENCOUNTER — Telehealth: Payer: Self-pay | Admitting: Radiology

## 2017-05-29 VITALS — BP 145/60 | HR 77 | Resp 16 | Ht 60.0 in | Wt 158.0 lb

## 2017-05-29 DIAGNOSIS — Z8781 Personal history of (healed) traumatic fracture: Secondary | ICD-10-CM | POA: Diagnosis not present

## 2017-05-29 DIAGNOSIS — M170 Bilateral primary osteoarthritis of knee: Secondary | ICD-10-CM

## 2017-05-29 DIAGNOSIS — M545 Low back pain: Secondary | ICD-10-CM | POA: Diagnosis not present

## 2017-05-29 DIAGNOSIS — E559 Vitamin D deficiency, unspecified: Secondary | ICD-10-CM

## 2017-05-29 DIAGNOSIS — Z79899 Other long term (current) drug therapy: Secondary | ICD-10-CM

## 2017-05-29 DIAGNOSIS — M5134 Other intervertebral disc degeneration, thoracic region: Secondary | ICD-10-CM

## 2017-05-29 DIAGNOSIS — M8000XD Age-related osteoporosis with current pathological fracture, unspecified site, subsequent encounter for fracture with routine healing: Secondary | ICD-10-CM | POA: Diagnosis not present

## 2017-05-29 DIAGNOSIS — M5136 Other intervertebral disc degeneration, lumbar region: Secondary | ICD-10-CM

## 2017-05-29 DIAGNOSIS — M25552 Pain in left hip: Secondary | ICD-10-CM

## 2017-05-29 DIAGNOSIS — Z8679 Personal history of other diseases of the circulatory system: Secondary | ICD-10-CM

## 2017-05-29 DIAGNOSIS — R102 Pelvic and perineal pain: Secondary | ICD-10-CM

## 2017-05-29 DIAGNOSIS — M7062 Trochanteric bursitis, left hip: Secondary | ICD-10-CM | POA: Diagnosis not present

## 2017-05-29 DIAGNOSIS — Z86711 Personal history of pulmonary embolism: Secondary | ICD-10-CM

## 2017-05-29 DIAGNOSIS — Z8719 Personal history of other diseases of the digestive system: Secondary | ICD-10-CM

## 2017-05-29 DIAGNOSIS — Z86718 Personal history of other venous thrombosis and embolism: Secondary | ICD-10-CM

## 2017-05-29 DIAGNOSIS — G8929 Other chronic pain: Secondary | ICD-10-CM

## 2017-05-29 DIAGNOSIS — M5030 Other cervical disc degeneration, unspecified cervical region: Secondary | ICD-10-CM | POA: Diagnosis not present

## 2017-05-29 DIAGNOSIS — Z8669 Personal history of other diseases of the nervous system and sense organs: Secondary | ICD-10-CM

## 2017-05-29 DIAGNOSIS — Z8673 Personal history of transient ischemic attack (TIA), and cerebral infarction without residual deficits: Secondary | ICD-10-CM

## 2017-05-29 DIAGNOSIS — Z8639 Personal history of other endocrine, nutritional and metabolic disease: Secondary | ICD-10-CM

## 2017-05-29 DIAGNOSIS — F5101 Primary insomnia: Secondary | ICD-10-CM

## 2017-05-29 MED ORDER — TRIAMCINOLONE ACETONIDE 40 MG/ML IJ SUSP
40.0000 mg | INTRAMUSCULAR | Status: AC | PRN
  Administered 2017-05-29: 40 mg via INTRA_ARTICULAR

## 2017-05-29 MED ORDER — LIDOCAINE HCL 1 % IJ SOLN
1.5000 mL | INTRAMUSCULAR | Status: AC | PRN
  Administered 2017-05-29: 1.5 mL

## 2017-05-29 NOTE — Telephone Encounter (Signed)
Patient will need Reclast after DEXA study / when you call with DEXA results, will you please let her know we need labs ? I placed orders / need these prior to the Reclast, and let me know, so I can send orders. Thank you so much.

## 2017-05-29 NOTE — Telephone Encounter (Signed)
Sticky note made in chart for reminder

## 2017-05-31 ENCOUNTER — Encounter (HOSPITAL_COMMUNITY): Payer: Self-pay | Admitting: Emergency Medicine

## 2017-05-31 ENCOUNTER — Emergency Department (HOSPITAL_COMMUNITY)
Admission: EM | Admit: 2017-05-31 | Discharge: 2017-06-01 | Disposition: A | Discharge status: Home / Self Care | Payer: Medicare Other | Attending: Emergency Medicine | Admitting: Emergency Medicine

## 2017-05-31 DIAGNOSIS — E119 Type 2 diabetes mellitus without complications: Secondary | ICD-10-CM | POA: Diagnosis not present

## 2017-05-31 DIAGNOSIS — Z7902 Long term (current) use of antithrombotics/antiplatelets: Secondary | ICD-10-CM | POA: Insufficient documentation

## 2017-05-31 DIAGNOSIS — M1612 Unilateral primary osteoarthritis, left hip: Secondary | ICD-10-CM

## 2017-05-31 DIAGNOSIS — I10 Essential (primary) hypertension: Secondary | ICD-10-CM | POA: Insufficient documentation

## 2017-05-31 DIAGNOSIS — Z79899 Other long term (current) drug therapy: Secondary | ICD-10-CM | POA: Insufficient documentation

## 2017-05-31 DIAGNOSIS — L231 Allergic contact dermatitis due to adhesives: Secondary | ICD-10-CM | POA: Insufficient documentation

## 2017-05-31 DIAGNOSIS — M25552 Pain in left hip: Secondary | ICD-10-CM | POA: Insufficient documentation

## 2017-05-31 DIAGNOSIS — Z8673 Personal history of transient ischemic attack (TIA), and cerebral infarction without residual deficits: Secondary | ICD-10-CM | POA: Diagnosis not present

## 2017-05-31 NOTE — ED Triage Notes (Signed)
Pt st's she has been having pain in left hip for several days  Was seen by her MD a few days ago and given an injection.  Pt st's now pain in left hip and left upper leg

## 2017-05-31 NOTE — ED Provider Notes (Addendum)
[facility]-EMERGENCY DEPT Provider Note   CSN: [id number] Arrival date & time: 05/31/17  1954 By signing my name below, I, [name] [name], attest that this documentation has been prepared under the direction and in the presence of [name] Kaplan, MD . Electronically Signed: Levon [name], Scribe. 05/31/2017. 11:53 PM.   History   Chief Complaint Chief Complaint  Patient presents with  . Hip Pain    HPI [name] [name] is a 83 y.o. female with a history of osteoporosis and osteoarthritis who presents to the Emergency Department complaining of progressively worsening, constant, severe left hip pain onset over one week ago. She was seen at [facility] three days ago for the same where she was diagnosed with trochanteric bursitis of left hip and received a cortisone injection with initial relief of pain. Pain returned the following day and is significantly more severe today. Pain is exacerbated by movement; family member reports that pt has not been able to ambulate today due to pain. She denies any nausea, vomiting, fever, chills, or central back pain.  The history is provided by the patient, a relative and medical records. No language interpreter was used.    Past Medical History:  Diagnosis Date  . Anxiety   . Diabetes (HCC) 10/27/2013  . DVT (deep venous thrombosis) (HCC)   . Essential tremor 10/27/2013  . High cholesterol 1999  . Hypertension   . Ischemic stroke (HCC) 07/18/2014  . Obesity, unspecified 10/27/2013  . Pneumonia   . Unspecified hereditary and idiopathic peripheral neuropathy 10/27/2013    Patient Active Problem List   Diagnosis Date Noted  . Lumbar pain 10/10/2016  . Pelvic pain 10/10/2016  . Seizures (HCC) 08/06/2014  . Abnormal involuntary movement 08/05/2014  . Hemiplegia, unspecified, affecting dominant side 08/05/2014  . Right sided weakness 08/05/2014  . CVA (cerebral vascular accident) (HCC) 07/21/2014  . Left leg pain 07/20/2014  . HTN (hypertension)  07/18/2014  . HLD (hyperlipidemia) 07/18/2014  . Thrombocytopenia (HCC) 07/18/2014  . Essential tremor 10/27/2013  . Obesity, unspecified 10/27/2013  . DM2 (diabetes mellitus, type 2) (HCC) 10/27/2013  . History of peripheral neuropathy 10/27/2013    Past Surgical History:  Procedure Laterality Date  . CATARACT EXTRACTION Bilateral   . FOOT SURGERY      OB History    No data available       Home Medications    Prior to Admission medications   Medication Sig Start Date End Date Taking? Authorizing Provider  acetaminophen (TYLENOL) 325 MG tablet Take 2 tablets (650 mg total) by mouth every 6 (six) hours as needed. 06/01/17   [name] Kaplan, MD  atorvastatin (LIPITOR) 20 MG tablet Take 20 mg by mouth every evening.     [provider]  CALCIUM PO Take 1 tablet by mouth daily.    [provider]  Cholecalciferol (VITAMIN D PO) Take 1 tablet by mouth daily.    [provider]  clopidogrel (PLAVIX) 75 MG tablet TAKE 1 TABLET (75 MG TOTAL) BY MOUTH DAILY. 05/19/17   Huston Foley, MD  gabapentin (NEURONTIN) 100 MG capsule TAKE 1 CAPSULE (100 MG TOTAL) BY MOUTH 2 (TWO) TIMES DAILY. 05/23/17   Dohmeier, Porfirio Mylar, MD  levETIRAcetam (KEPPRA) 500 MG tablet TAKE 1 TABLET (500 MG TOTAL) BY MOUTH 2 (TWO) TIMES DAILY. 03/26/17   Huston Foley, MD  naproxen (NAPROSYN) 375 MG tablet Take 1 tablet (375 mg total) by mouth 2 (two) times daily. 06/01/17   [name] Kaplan, MD  primidone (MYSOLINE) 250 MG  tablet TAKE 1 TABLET (250 MG TOTAL) BY MOUTH 2 (TWO) TIMES DAILY. 05/05/17   Huston FoleyAthar, Saima, MD  RESTASIS 0.05 % ophthalmic emulsion Place 1 drop into both eyes 2 (two) times daily.  09/29/14   [provider]  VOLTAREN 1 % GEL APPLY 2 G TOPICALLY 4 (FOUR) TIMES DAILY. 04/24/17   Garth Bignessimberlake, Kathryn, MD    Family History Family History  Problem Relation Age of Onset  . Stroke Mother   . Heart Problems Brother   . Coronary artery disease Other   . Heart Problems Sister      Social History Social History  Substance Use Topics  . Smoking status: Never Smoker  . Smokeless tobacco: Never Used  . Alcohol use No     Allergies   Tape   Review of Systems Review of Systems All systems reviewed and are negative for acute change except as noted in the HPI.  Physical Exam Updated Vital Signs BP 140/65   Pulse (!) 55   Temp 97.7 F (36.5 C) (Oral)   Resp 18   Ht 5' (1.524 m)   Wt 71.7 kg (158 lb)   SpO2 98%   BMI 30.86 kg/m   Physical Exam  Constitutional: She is oriented to person, place, and time. She appears well-developed and well-nourished. No distress.  HENT:  Head: Normocephalic and atraumatic.  Eyes: Conjunctivae are normal.  Cardiovascular: Normal rate and regular rhythm.   Pulmonary/Chest: Effort normal.  Lungs CTA  Abdominal: She exhibits no distension.  Musculoskeletal:  Pt has tenderness to palpation over the left posterior hip. Tenderness with passive range of motion of the left hip, which includes passive leg raise and rotation of the hips.  Neurological: She is alert and oriented to person, place, and time.  Skin: Skin is warm and dry.  Nursing note and vitals reviewed.  ED Treatments / Results  DIAGNOSTIC STUDIES:  Oxygen Saturation is 97% on RA, normal by my interpretation.    COORDINATION OF CARE:  11:51 PM Discussed treatment plan with pt at bedside and pt agreed to plan.   Labs (all labs ordered are listed, but only abnormal results are displayed) Labs Reviewed  CBC WITH DIFFERENTIAL/PLATELET - Abnormal; Notable for the following:       Result Value   Platelets 108 (*)    All other components within normal limits  BASIC METABOLIC PANEL - Abnormal; Notable for the following:    Glucose, Bld 107 (*)    All other components within normal limits  SEDIMENTATION RATE  C-REACTIVE PROTEIN    EKG  EKG Interpretation None       Radiology No results found.  Procedures Procedures (including critical care  time)  Medications Ordered in ED Medications  fentaNYL (SUBLIMAZE) injection 50 mcg (50 mcg Intravenous Given 06/01/17 0049)  HYDROcodone-acetaminophen (NORCO/VICODIN) 5-325 MG per tablet 1 tablet (1 tablet Oral Given 06/01/17 0214)  ketorolac (TORADOL) 15 MG/ML injection 15 mg (15 mg Intravenous Given 06/01/17 0216)     Initial Impression / Assessment and Plan / ED Course  I have reviewed the triage vital signs and the nursing notes.  Pertinent labs & imaging results that were available during my care of the patient were reviewed by me and considered in my medical decision making (see chart for details).  Clinical Course as of Jun 02 327  Wynelle LinkSun Jun 01, 2017  0316 Pt able to ambulate  [EH]  0327 Pt ambulated. Will email primary doctor about the case, and also will  consult orthopedist.   [AN]    Clinical Course User Index [AN] [name] Kaplan, MD [EH] [name] [name]    Pt comes in with severe L hip pain. Pain started few days back and it is getting worse. She had intraarticular injection few days back - pain has worsened to the point where pt now not able to walk. Normally pt walks with a walker / cain. Pt is not taking any meds. xrays of her spine and hip reviewed from 7/5. Exam shows very joint. No clear signs of infection. Sed rate and labs are unremarkable. We will give oral meds and reassess.   Final Clinical Impressions(s) / ED Diagnoses   Final diagnoses:  Acute pain of left hip  Primary osteoarthritis of left hip    New Prescriptions New Prescriptions   ACETAMINOPHEN (TYLENOL) 325 MG TABLET    Take 2 tablets (650 mg total) by mouth every 6 (six) hours as needed.   NAPROXEN (NAPROSYN) 375 MG TABLET    Take 1 tablet (375 mg total) by mouth 2 (two) times daily.   I personally performed the services described in this documentation, which was scribed in my presence. The recorded information has been reviewed and is accurate.    [name] Kaplan, MD 06/01/17 1610     [name] Kaplan, MD 06/01/17 [phone]

## 2017-06-01 DIAGNOSIS — M25552 Pain in left hip: Secondary | ICD-10-CM | POA: Diagnosis not present

## 2017-06-01 LAB — BASIC METABOLIC PANEL
Anion gap: 8 (ref 5–15)
BUN: 11 mg/dL (ref 6–20)
CHLORIDE: 104 mmol/L (ref 101–111)
CO2: 26 mmol/L (ref 22–32)
CREATININE: 0.85 mg/dL (ref 0.44–1.00)
Calcium: 9.1 mg/dL (ref 8.9–10.3)
GFR calc Af Amer: >60 mL/min (ref >60)
GFR calc non Af Amer: >60 mL/min (ref >60)
Glucose, Bld: 107 mg/dL — ABNORMAL HIGH (ref 65–99)
Potassium: 4.5 mmol/L (ref 3.5–5.1)
Sodium: 138 mmol/L (ref 135–145)

## 2017-06-01 LAB — CBC WITH DIFFERENTIAL/PLATELET
Basophils Absolute: 0 10*3/uL (ref 0.0–0.1)
Basophils Relative: 0 %
EOS ABS: 0 10*3/uL (ref 0.0–0.7)
Eosinophils Relative: 1 %
HCT: 45 % (ref 36.0–46.0)
HEMOGLOBIN: 15 g/dL (ref 12.0–15.0)
LYMPHS ABS: 2 10*3/uL (ref 0.7–4.0)
Lymphocytes Relative: 37 %
MCH: 30.7 pg (ref 26.0–34.0)
MCHC: 33.3 g/dL (ref 30.0–36.0)
MCV: 92.2 fL (ref 78.0–100.0)
MONOS PCT: 8 %
Monocytes Absolute: 0.4 10*3/uL (ref 0.1–1.0)
NEUTROS PCT: 54 %
Neutro Abs: 2.9 10*3/uL (ref 1.7–7.7)
Platelets: 108 10*3/uL — ABNORMAL LOW (ref 150–400)
RBC: 4.88 MIL/uL (ref 3.87–5.11)
RDW: 13.4 % (ref 11.5–15.5)
WBC: 5.4 10*3/uL (ref 4.0–10.5)

## 2017-06-01 LAB — SEDIMENTATION RATE: SED RATE: 4 mm/h (ref 0–22)

## 2017-06-01 MED ORDER — ACETAMINOPHEN 325 MG PO TABS: 650.0000 mg | ORAL_TABLET | Freq: Four times a day (QID) | ORAL | 0 refills | Status: DC | PRN

## 2017-06-01 MED ORDER — NAPROXEN 375 MG PO TABS
375.0000 mg | ORAL_TABLET | Freq: Two times a day (BID) | ORAL | 0 refills | Status: DC
Start: 2017-06-01 — End: 2018-06-22

## 2017-06-01 MED ORDER — HYDROCODONE-ACETAMINOPHEN 5-325 MG PO TABS
1.0000 | ORAL_TABLET | Freq: Once | ORAL | Status: AC
  Administered 2017-06-01: 1 via ORAL
  Filled 2017-06-01: qty 1

## 2017-06-01 MED ORDER — KETOROLAC TROMETHAMINE 15 MG/ML IJ SOLN
15.0000 mg | Freq: Once | INTRAMUSCULAR | Status: AC
  Administered 2017-06-01: 15 mg via INTRAVENOUS
  Filled 2017-06-01: qty 1

## 2017-06-01 MED ORDER — FENTANYL CITRATE (PF) 100 MCG/2ML IJ SOLN
50.0000 ug | Freq: Once | INTRAMUSCULAR | Status: AC
  Administered 2017-06-01: 50 ug via INTRAVENOUS
  Filled 2017-06-01: qty 2

## 2017-06-01 NOTE — Discharge Instructions (Signed)
Please see your primary care doctor in 2-3 days. Consider seeing orthopedist as well. Return to the ER if the symptoms get worse.

## 2017-06-01 NOTE — ED Notes (Signed)
PT able to ambulate to restroom with cane and decreased pain with a steady gait.

## 2017-06-01 NOTE — ED Notes (Signed)
PT states understanding of care given, follow up care, and medication prescribed. PT ambulated from ED to car with a steady gait. 

## 2017-06-02 ENCOUNTER — Telehealth: Payer: Self-pay | Admitting: Family Medicine

## 2017-06-02 NOTE — Telephone Encounter (Signed)
Called patient to discuss recent ED visit for hip pain. No answer, left generic voicemail. If the patient or her son calls back, please ask them if they were able to schedule follow[facility]up [facility], and if they need any help with this. Please also ask if the patient is having improvement in her pain since the ED visit.

## 2017-06-02 NOTE — Telephone Encounter (Signed)
Has appt with [facility] on 06/09/17. Fleeger, Maryjo RochesterJessica Dawn, CMA

## 2017-06-09 ENCOUNTER — Ambulatory Visit (INDEPENDENT_AMBULATORY_CARE_PROVIDER_SITE_OTHER): Payer: Medicare Other | Admitting: Family

## 2017-06-09 NOTE — Progress Notes (Signed)
Office Visit Note  Patient: [name] [name]             Date of Birth: 1929-12-02           MRN: [id number]             PCP: Garth Bigness, MD Referring: Garth Bigness, MD Visit Date: 06/11/2017 Occupation: @GUAROCC @    Subjective:  Lower back pain.   History of Present Illness: [name] [name] is a 83 y.o. female with history of disc disease and osteoarthritis. She states she did well after the left trochanteric bursa injection for couple of days. And then the pain recurred to the point she was having difficulty walking. She went to the emergency room where she was given some pain medications. She's been taking Tylenol which is helping her to some extent. She continues to have pain in her lower back which radiates to her left lower extremity.  Activities of Daily Living:  Patient reports morning stiffness for 30 minutes.   Patient denies nocturnal pain.  Difficulty dressing/grooming: Denies Difficulty climbing stairs: Reports Difficulty getting out of chair: Reports Difficulty using hands for taps, buttons, cutlery, and/or writing: Denies   Review of Systems  Constitutional: Positive for fatigue. Negative for night sweats, weight gain, weight loss and weakness.  HENT: Negative for mouth sores, trouble swallowing, trouble swallowing, mouth dryness and nose dryness.   Eyes: Negative for pain, redness, visual disturbance and dryness.  Respiratory: Negative for cough, shortness of breath and difficulty breathing.   Cardiovascular: Negative for chest pain, palpitations, hypertension, irregular heartbeat and swelling in legs/feet.  Gastrointestinal: Negative for blood in stool, constipation and diarrhea.  Endocrine: Negative for increased urination.  Genitourinary: Negative for vaginal dryness.  Musculoskeletal: Positive for arthralgias, joint pain and morning stiffness. Negative for joint swelling, myalgias, muscle weakness, muscle tenderness and myalgias.  Skin:  Negative for color change, rash, hair loss, skin tightness, ulcers and sensitivity to sunlight.  Allergic/Immunologic: Negative for susceptible to infections.  Neurological: Negative for dizziness, memory loss and night sweats.  Hematological: Negative for swollen glands.  Psychiatric/Behavioral: Negative for depressed mood and sleep disturbance. The patient is not nervous/anxious.     PMFS History:  Patient Active Problem List   Diagnosis Date Noted  . Lumbar pain 10/10/2016  . Pelvic pain 10/10/2016  . Seizures (HCC) 08/06/2014  . Abnormal involuntary movement 08/05/2014  . Hemiplegia, unspecified, affecting dominant side 08/05/2014  . Right sided weakness 08/05/2014  . CVA (cerebral vascular accident) (HCC) 07/21/2014  . Left leg pain 07/20/2014  . HTN (hypertension) 07/18/2014  . HLD (hyperlipidemia) 07/18/2014  . Thrombocytopenia (HCC) 07/18/2014  . Essential tremor 10/27/2013  . Obesity, unspecified 10/27/2013  . DM2 (diabetes mellitus, type 2) (HCC) 10/27/2013  . History of peripheral neuropathy 10/27/2013    Past Medical History:  Diagnosis Date  . Anxiety   . Diabetes (HCC) 10/27/2013  . DVT (deep venous thrombosis) (HCC)   . Essential tremor 10/27/2013  . High cholesterol 1999  . Hypertension   . Ischemic stroke (HCC) 07/18/2014  . Obesity, unspecified 10/27/2013  . Pneumonia   . Unspecified hereditary and idiopathic peripheral neuropathy 10/27/2013    Family History  Problem Relation Age of Onset  . Stroke Mother   . Heart Problems Brother   . Coronary artery disease Other   . Heart Problems Sister    Past Surgical History:  Procedure Laterality Date  . CATARACT EXTRACTION Bilateral   . FOOT SURGERY     Social  History   Social History Narrative   Patient is right handed and resides with son     Objective: Vital Signs: BP 134/72   Pulse 82   Resp 16   Ht 5\' 2"  (1.575 m)    Physical Exam  Constitutional: She is oriented to person, place, and time.  She appears well-developed and well-nourished.  HENT:  Head: Normocephalic and atraumatic.  Eyes: Conjunctivae and EOM are normal.  Neck: Normal range of motion.  Cardiovascular: Normal rate, regular rhythm, normal heart sounds and intact distal pulses.   Pulmonary/Chest: Effort normal and breath sounds normal.  Abdominal: Soft. Bowel sounds are normal.  Lymphadenopathy:    She has no cervical adenopathy.  Neurological: She is alert and oriented to person, place, and time.  Skin: Skin is warm and dry. Capillary refill takes less than 2 seconds.  Psychiatric: She has a normal mood and affect. Her behavior is normal.  Nursing note and vitals reviewed.    Musculoskeletal Exam: C-spine good range of motion. She has tenderness in the lower lumbar region. She has severe scoliosis. Left trochanteric bursitis is better. All the joints of range of motion with no synovitis.  CDAI Exam: No CDAI exam completed.    Investigation: No additional findings.   Imaging: Xr Lumbar Spine 2-3 Views  Result Date: 05/29/2017 Multilevel spondylosis mild scoliosis she has L4-5 listhesis L1 vertebral fracture which is old. Impression: Multilevel spondylosis with L4-5 listhesis.  Xr Pelvis 1-2 Views  Result Date: 05/29/2017 Hip joints reveal mild superior lateral narrowing, no fracture was noted. Some osteoarthritic changes in her SI joints were noted.   Speciality Comments: No specialty comments available.    Procedures:  No procedures performed Allergies: Tape   Assessment / Plan:     Visit Diagnoses: Age-related osteoporosis without current pathological fracture: Patient has not had her bone density yet after that she supposed to get Reclast infusion.  DDD (degenerative disc disease), cervical: She has limited range of motion of her C-spine  DDD (degenerative disc disease), thoracic: Not much discomfort  DDD (degenerative disc disease), lumbar: She's having lower back pain with left-sided  radiculopathy. Her x-rays last visit showed some listhesis. I'll schedule MRI of her lumbar spine to evaluate this further.  History of vertebral fracture thoracic  Her other medical problems are listed as follows:  History of hyperlipidemia  History of hypertension  History of gastroesophageal reflux (GERD)  History of DVT (deep vein thrombosis) and Pulmonary Embolism  History of CVA (cerebrovascular accident)    Orders: Orders Placed This Encounter  Procedures  . MR LUMBAR SPINE WO CONTRAST   No orders of the defined types were placed in this encounter.   Face-to-face time spent with patient was 25 minutes. 50% of time was spent in counseling and coordination of care.  Follow-Up Instructions: Return for Osteoporosis DDD.   Pollyann SavoyShaili Deveshwar, MD  Note - This record has been created using [employer] software.  Chart creation errors have been sought, but may not always  have been located. Such creation errors do not reflect on  the standard of medical care.

## 2017-06-11 ENCOUNTER — Ambulatory Visit (INDEPENDENT_AMBULATORY_CARE_PROVIDER_SITE_OTHER): Payer: Medicare Other | Admitting: Rheumatology

## 2017-06-11 ENCOUNTER — Encounter: Payer: Self-pay | Admitting: Rheumatology

## 2017-06-11 VITALS — BP 134/72 | HR 82 | Resp 16 | Ht 62.0 in

## 2017-06-11 DIAGNOSIS — M5134 Other intervertebral disc degeneration, thoracic region: Secondary | ICD-10-CM

## 2017-06-11 DIAGNOSIS — M810 Age-related osteoporosis without current pathological fracture: Secondary | ICD-10-CM

## 2017-06-11 DIAGNOSIS — Z8781 Personal history of (healed) traumatic fracture: Secondary | ICD-10-CM | POA: Diagnosis not present

## 2017-06-11 DIAGNOSIS — M545 Low back pain, unspecified: Secondary | ICD-10-CM

## 2017-06-11 DIAGNOSIS — Z8639 Personal history of other endocrine, nutritional and metabolic disease: Secondary | ICD-10-CM

## 2017-06-11 DIAGNOSIS — M7062 Trochanteric bursitis, left hip: Secondary | ICD-10-CM | POA: Diagnosis not present

## 2017-06-11 DIAGNOSIS — Z86718 Personal history of other venous thrombosis and embolism: Secondary | ICD-10-CM

## 2017-06-11 DIAGNOSIS — Z8679 Personal history of other diseases of the circulatory system: Secondary | ICD-10-CM | POA: Diagnosis not present

## 2017-06-11 DIAGNOSIS — Z8673 Personal history of transient ischemic attack (TIA), and cerebral infarction without residual deficits: Secondary | ICD-10-CM

## 2017-06-11 DIAGNOSIS — M5136 Other intervertebral disc degeneration, lumbar region: Secondary | ICD-10-CM

## 2017-06-11 DIAGNOSIS — M5442 Lumbago with sciatica, left side: Secondary | ICD-10-CM

## 2017-06-11 DIAGNOSIS — M5030 Other cervical disc degeneration, unspecified cervical region: Secondary | ICD-10-CM

## 2017-06-11 DIAGNOSIS — Z8719 Personal history of other diseases of the digestive system: Secondary | ICD-10-CM

## 2017-06-25 ENCOUNTER — Ambulatory Visit (HOSPITAL_COMMUNITY)
Admission: RE | Admit: 2017-06-25 | Discharge: 2017-06-25 | Disposition: A | Discharge status: Home / Self Care | Payer: Medicare Other | Source: Ambulatory Visit | Attending: Rheumatology | Admitting: Rheumatology

## 2017-06-25 DIAGNOSIS — M47897 Other spondylosis, lumbosacral region: Secondary | ICD-10-CM | POA: Diagnosis not present

## 2017-06-25 DIAGNOSIS — M4316 Spondylolisthesis, lumbar region: Secondary | ICD-10-CM | POA: Insufficient documentation

## 2017-06-25 DIAGNOSIS — M545 Low back pain, unspecified: Secondary | ICD-10-CM

## 2017-06-25 DIAGNOSIS — M5442 Lumbago with sciatica, left side: Secondary | ICD-10-CM

## 2017-06-25 DIAGNOSIS — Z8781 Personal history of (healed) traumatic fracture: Secondary | ICD-10-CM

## 2017-06-25 DIAGNOSIS — M48061 Spinal stenosis, lumbar region without neurogenic claudication: Secondary | ICD-10-CM | POA: Diagnosis not present

## 2017-06-25 DIAGNOSIS — R29890 Loss of height: Secondary | ICD-10-CM | POA: Insufficient documentation

## 2017-06-25 DIAGNOSIS — M4856XA Collapsed vertebra, not elsewhere classified, lumbar region, initial encounter for fracture: Secondary | ICD-10-CM | POA: Diagnosis not present

## 2017-06-25 DIAGNOSIS — M47896 Other spondylosis, lumbar region: Secondary | ICD-10-CM | POA: Insufficient documentation

## 2017-06-25 DIAGNOSIS — M5136 Other intervertebral disc degeneration, lumbar region: Secondary | ICD-10-CM

## 2017-06-25 NOTE — Progress Notes (Signed)
Refer to Dr. Newton

## 2017-06-27 ENCOUNTER — Telehealth: Payer: Self-pay | Admitting: *Deleted

## 2017-06-27 DIAGNOSIS — M545 Low back pain, unspecified: Secondary | ICD-10-CM

## 2017-06-27 DIAGNOSIS — M79605 Pain in left leg: Secondary | ICD-10-CM

## 2017-06-27 NOTE — Telephone Encounter (Signed)
-----   Message from Pollyann SavoyShaili Deveshwar, MD sent at 06/25/2017  4:57 PM EDT ----- Refer to Dr. Alvester MorinNewton

## 2017-07-02 ENCOUNTER — Encounter: Payer: Self-pay | Admitting: Family Medicine

## 2017-07-02 ENCOUNTER — Ambulatory Visit (INDEPENDENT_AMBULATORY_CARE_PROVIDER_SITE_OTHER): Payer: Medicare Other | Admitting: Family Medicine

## 2017-07-02 DIAGNOSIS — G250 Essential tremor: Secondary | ICD-10-CM | POA: Diagnosis not present

## 2017-07-02 DIAGNOSIS — R531 Weakness: Secondary | ICD-10-CM | POA: Diagnosis not present

## 2017-07-02 NOTE — Assessment & Plan Note (Signed)
>>  ASSESSMENT AND PLAN FOR RIGHT SIDED WEAKNESS WRITTEN ON 07/02/2017 12:00 PM BY Garth BignessIMBERLAKE, KATHRYN, MD  Neuro exam consistent with previous deficit, no new neuro deficits. Will continue to monitor.

## 2017-07-02 NOTE — Progress Notes (Signed)
   CC: physical, shaking  HPI Patient speaks Farsi, her son is here today and interprets for her. Form signed. Son reports that the patient has been having increased shaking, more frequently when she wakes up. They have checked her blood sugar and it has been 100-110 at that time. Additionally, she feels that the numbness in her right upper extremity has worsened somewhat. No acute changes in strength or sensation. They follow with neurology for her essential tremor and right-sided weakness.  CC, SH/smoking status, and VS noted  Objective: BP 130/68   Pulse 68   Temp 98.5 F (36.9 C) (Oral)   Ht 5' (1.524 m)   Wt 154 lb (69.9 kg)   SpO2 97%   BMI 30.08 kg/m  Gen: NAD, alert, cooperative, and pleasant. HEENT: NCAT, EOMI, PERRL CV: RRR, no murmur Resp: CTAB, no wheezes, non-labored Abd: SNTND, BS present, no guarding or organomegaly Ext: No edema, warm Neuro: Alert and oriented, Speech clear, CN II-XII grossly intact. RUE 4+/5 strength.  Assessment and plan:  Essential tremor Patient on primidone for this, managed by neurology. Helped patient make a closer follow up with neurology to titrate this.  Right sided weakness Neuro exam consistent with previous deficit, no new neuro deficits. Will continue to monitor.    Loni MuseKate Timberlake, MD, PGY2 07/02/2017 12:00 PM

## 2017-07-02 NOTE — Assessment & Plan Note (Signed)
Patient on primidone for this, managed by neurology. Helped patient make a closer follow up with neurology to titrate this.

## 2017-07-02 NOTE — Patient Instructions (Signed)
It was a pleasure to see you today! Thank you for choosing [facility] for your primary care. [name] [name] was seen for phyiscal, shaking.   Our plans for today were:  You need to check with your neurologist about the shaking. They may need to change her medicines around.  You should return to our clinic to see Dr. Chanetta Marshallimberlake in 6 months for physical.   Best,  Dr. Chanetta Marshallimberlake

## 2017-07-02 NOTE — Assessment & Plan Note (Signed)
Neuro exam consistent with previous deficit, no new neuro deficits. Will continue to monitor.

## 2017-07-08 ENCOUNTER — Ambulatory Visit (INDEPENDENT_AMBULATORY_CARE_PROVIDER_SITE_OTHER): Payer: Medicare Other | Admitting: Neurology

## 2017-07-08 ENCOUNTER — Encounter: Payer: Self-pay | Admitting: Neurology

## 2017-07-08 VITALS — BP 149/79 | HR 83 | Ht 60.0 in | Wt 154.0 lb

## 2017-07-08 DIAGNOSIS — G250 Essential tremor: Secondary | ICD-10-CM

## 2017-07-08 DIAGNOSIS — M545 Low back pain, unspecified: Secondary | ICD-10-CM

## 2017-07-08 DIAGNOSIS — R569 Unspecified convulsions: Secondary | ICD-10-CM | POA: Diagnosis not present

## 2017-07-08 DIAGNOSIS — M79605 Pain in left leg: Secondary | ICD-10-CM

## 2017-07-08 DIAGNOSIS — R531 Weakness: Secondary | ICD-10-CM | POA: Diagnosis not present

## 2017-07-08 DIAGNOSIS — Z8673 Personal history of transient ischemic attack (TIA), and cerebral infarction without residual deficits: Secondary | ICD-10-CM

## 2017-07-08 NOTE — Progress Notes (Signed)
Subjective:    Patient ID: [name] [name] is a 83 y.o. female.  HPI     Interim history:    [name] [name] is a very pleasant 83 year old Middle Eastern/[place]ian lady with an underlying medical history of hypertension, hyperlipidemia, DVT, pneumonia, anxiety, obesity, diabetes, peripheral neuropathy, stroke and essential tremor, who presents for follow-up consultation of her essential tremor. She is accompanied by her son again today and presents for a sooner than scheduled appointment due to worsening of her tremor. I last saw her on  11/11/2016, at which time her tremor was mostly stable, she had no recent falls. Her right leg weakness was about the same as well. She did present to the emergency room in November 2017 for back pain and leg pain. She was found to have a UTI.  She was also found on CT lumbar spine to have a L1 vertebral body fracture, old.   She was seen in the interim by Lanae Crumbly on 05/13/2017, at which time she was noted to be fairly stable and her medications were kept the same.  She presented in the interim to the emergency room on 05/31/2017 for hip pain on the left. She saw her family physician on 07/02/2017.  Today, 07/08/2017: She reports that her tremor is worse, has intermittent leg cramps. She has at times worse numbness on the R. No new symtoms. Ongoing LBP with radiation to the L hip and leg. Leg cramping R calf. Had a L spine MRI on 06/25/17: IMPRESSION: 1. Grade 1 L4-L5 anterolisthesis secondary to severe bilateral facet arthrosis. Mild left foraminal stenosis. 2. Severe L5-S1 facet arthrosis without associated stenosis. This may be a source of local low back pain. 3. Chronic compression fracture of L1 with approximately 50% central height loss and no significant retropulsion. 4. Otherwise generalized mild lumbar degenerative disc disease without spinal canal stenosis or nerve root impingement.   She has an appt with Dr. Ernestina Patches for injection this week.     Previously:    I saw her on 05/08/2016, at which time she had recently fallen about 10 days prior. She had visited her grandmother daughter who is expecting a baby and fell backwards landed on her back. She had left-sided back pain since then. She went to the emergency room on 05/04/2016 and was given an injection with Decadron, Toradol, Dilaudid, but did not have much in way of relief. She had a bone scan on 05/06/16, which I reviewed: IMPRESSION: 1. No evidence of a recent fracture. 2. No evidence of neoplastic disease to bone. 3. Significant degenerative uptake noted along spine as well as involving multiple bilateral joints. She reported that her tremor was worse. She had more stress and also more pain.   I suggested she continue with her Keppra, Plavix, Mysoline and Neurontin.    I saw her on 09/25/15, at which time she felt she was doing well. She was worried about her brother in [place]. She was not able to travel to [place] and was sad about it. She was planning to fly to [place] to be with her daughter and other family for some time. No recent seizures or strokelike symptoms were reported. Tremor was stable. She was using a cane and had no recent falls. I suggested we continue with Keppra, Plavix, gabapentin and Mysoline at the same doses.   I saw her on 04/26/2015 at which time she reported difficulty with her balance. Her tremor had become a little worse. She had had no recent seizures or  auras. She was able to tolerate Keppra 500 mg twice daily. She was on the same dose of Mysoline for her tremors. She had benefited from physical therapy at [facility] and requested to go back for outpatient physical therapy. She had not fallen. She was using a 4 pronged cane and was no longer using her walker.   I saw her on 11/16/2014, at which time her son reported that she was doing well. She was in outpatient physical therapy and was using a 2 wheeled walker. She was taking gabapentin at a  lower dose. Lowering gabapentin had improved her heavy headedness in the morning. Her tremor was stable. She was tolerating Keppra 500 mg twice daily with no recent seizures or auras reported. Overall she has done well. I continued her on the same dose of primidone, the same dose of Keppra, and suggested we reduce her gabapentin further to 100 mg in the morning and 200 mg in the evening.    Of note, they canceled an appointment for 03/17/2015.   I saw her on 08/19/2014 at which time she presented after her hospitalization for stroke. Her son reported that Aggrenox was causing her headaches and she was switched to Plavix. We talked about sleep apnea screening. She had no witnessed apneas and only mild snoring. She had done fairly well. They requested a referral to outpatient therapy in [facility]. She was placed on Keppra for episodic twitching. She had had some medication changes when she was hospitalized for her stroke. She was advised to use her 2 wheeled walker with assistance. I referred her to physical therapy outpatient. We talked about secondary stroke prevention. I suggested she reduce gabapentin to 200 mg twice daily down from 300 mg twice daily because of dizziness and head heaviness reported. This continued on Mysoline for her essential tremor.   I saw her on 10/27/2013 at which time she presented for followup of her long-standing history of essential tremor. I felt she was stable in that regard and felt that her diabetes had resulted in diabetic neuropathy. I asked her to continue with Mysoline at the same strength and increased her gabapentin to 3 pills at night for a total of 300 mg each bedtime.   In the interim she presented to the emergency room on 07/18/2014 with new onset right-sided weakness, facial droop, slurring of speech which started around 6 PM the night before. She was in [address], [place] visiting family when she had acute onset of the symptoms. She was seen in [place]  at the hospital and CT head was negative for bleed but she did not receive TPA because of low platelet count of 90,000. She had improvement of her symptoms but persistent deficits. She was admitted to the hospital and had a complete stroke workup. She had an MRI of the brain as well as MRA head and was found to have a left thalamic ischemic stroke. Neurology was consulted. MRA showed left PCA occlusion. Echocardiogram showed an EF of 60-65% with no wall motion abnormalities and no source of emboli. Carotid Doppler studies were negative for any significant ICA stenosis (1-39%). Hb A1c was 6. LDL was 62. She was placed on aspirin for secondary stroke prevention and then changed to Aggrenox. Blood work showed normal B12 levels, negative hepatitis serology, chest x-ray was negative. EKG did not show any ST abnormalities. She had some left leg pain. Lower extremity Doppler studies from 07/20/2014 were negative for DVT. She was discharged on 07/21/2014 to inpatient  rehabilitation. She was discharged from rehabilitation on 08/02/2014. I reviewed hospital records as well as imaging tests. Her daughter called and asked for a sooner than scheduled appointment because of worsening weakness. However, she missed an appointment on 08/05/2014 because she was readmitted to the hospital on 08/04/2014 for slurring of speech and shaking spells. I reviewed the hospital records. She had an EEG, which was negative for seizure activity but she was felt to have partial onset seizures and was started on Keppra which is currently at 500 mg twice daily. She was changed from Aggrenox to Plavix 75 mg.   She had a brain MRI without contrast on 08/05/2014: 1. Persistent 1.1 cm focus of linear restricted diffusion within the lateral left thalamus near the posterior limb of the left internal capsule. Finding is favored to reflect resolving infarct from the 07/19/2014 study given the relative decreased signal intensity and size from that prior  exam, although possible acute re-infarction of this area is not entirely excluded. 2. No other acute intracranial abnormality. In addition, I personally reviewed the images through the PACS system. She had an EEG on 08/06/2014: This was reported as normal in the asleep and awake states. Her son called on 08/15/2014 to make an appointment.   I first met her on 01/19/2013, at which time I increased her Mysoline 250 mg strength 1-1/2 pills to 2 pills daily. I did advise her of the potential increase risk of side effects. She previously followed with Dr. Morene Antu. She has a billateral UE and head tremor for over 25 years. She was tried on primidone while she was in [place] with improvement in tremor. It was discontinued when she came to the [place] because she was on Coumadin. Her tremor affects her ability to feed herself, write, and put on her makeup and is worse with anxiety. Her daughter has a tremor as well. She was restarted on primidone by Dr. Erling Cruz in 3/12. She is no longer on coumadin. She was on coumadin for a history of DVT in her left leg following left foot injury. She has had numbness in her legs without pain. EMG/NCV 02/27/11 was normal except for some denervation in the left lumbosacral paraspinal muscles. B12 level was normal. She is on gabapentin 100 mg 2 at night with relief of symptoms. Her major complaint is that of ongoing tremor, which is worse in the morning. She has not had side effects such as grogginess or difficulty walking with the primidone. She no longer cooks. Her tremor involves her writing and abilities to feed herself. She also continues to take gabapentin 113m 2 qHS for her neuropathy. She has not fallen per son. She was supposed to come back in 3 months but missed several appointments. She has been diagnosed with DM and has been started on metforim. She feels, her numbness and tingling is worse in her feet and she has now has started noticing burning sensation in her  feet.  Her Past Medical History Is Significant For: Past Medical History:  Diagnosis Date  . Anxiety   . Diabetes ([facility] 10/27/2013  . D[place] (deep venous thrombosis) ([facility]   . Essential tremor 10/27/2013  . High cholesterol 1999  . Hypertension   . [place]schemic stroke ([facility] 07/18/2014  . Obesity, unspecified 10/27/2013  . Pneumonia   . Unspecified hereditary and idiopathic peripheral neuropathy 10/27/2013    Her Past Surgical History Is Significant For: Past Surgical History:  Procedure Laterality Date  . CATARACT EXTRACTION Bilateral   .  FOOT SURGERY      Her Family History Is Significant For: Family History  Problem Relation Age of Onset  . Stroke Mother   . Heart Problems Brother   . Coronary artery disease Other   . Heart Problems Sister     Her Social History Is Significant For: Social History   Social History  . Marital status: Widowed    Spouse name: N/A  . Number of children: 5  . Years of education: 9   Occupational History  .      does not work   Social History Main Topics  . Smoking status: Never Smoker  . Smokeless tobacco: Never Used  . Alcohol use No  . Drug use: No  . Sexual activity: No   Other Topics Concern  . None   Social History Narrative   Patient is right handed and resides with son    Her Allergies Are:  Allergies  Allergen Reactions  . Tape Rash    Please do not use Plastic Tape  :   Her Current Medications Are:  Outpatient Encounter Prescriptions as of 07/08/2017  Medication Sig  . acetaminophen (TYLENOL) 325 MG tablet Take 2 tablets (650 mg total) by mouth every 6 (six) hours as needed.  [name] amLODipine (NORVASC) 2.5 MG tablet Take 2.5 mg by mouth daily.  [name] atorvastatin (LIPITOR) 20 MG tablet Take 20 mg by mouth every evening.   [name] CALCIUM PO Take 1 tablet by mouth daily.  . Cholecalciferol (VITAMIN D PO) Take 1 tablet by mouth daily.  . clopidogrel (PLAVIX) 75 MG tablet TAKE 1 TABLET (75 MG TOTAL) BY MOUTH DAILY.  [name] gabapentin  (NEURONTIN) 100 MG capsule TAKE 1 CAPSULE (100 MG TOTAL) BY MOUTH 2 (TWO) TIMES DAILY.  [name] levETIRAcetam (KEPPRA) 500 MG tablet TAKE 1 TABLET (500 MG TOTAL) BY MOUTH 2 (TWO) TIMES DAILY.  . naproxen (NAPROSYN) 375 MG tablet Take 1 tablet (375 mg total) by mouth 2 (two) times daily.  . primidone (MYSOLINE) 250 MG tablet TAKE 1 TABLET (250 MG TOTAL) BY MOUTH 2 (TWO) TIMES DAILY.  [name] RESTASIS 0.05 % ophthalmic emulsion Place 1 drop into both eyes 2 (two) times daily.   . VOLTAREN 1 % GEL APPLY 2 G TOPICALLY 4 (FOUR) TIMES DAILY.   No facility-administered encounter medications on file as of 07/08/2017.   :  Review of Systems:  Out of a complete 14 point review of systems, all are reviewed and negative with the exception of these symptoms as listed below: Review of Systems  Neurological:       Pt presents today to discuss her right sided numbness and increased tremors. Pt is concerned about her right leg cramping and swelling as well.    Objective:  Neurological Exam  Physical Exam Physical Examination:   Vitals:   07/08/17 1036  BP: (!) 149/79  Pulse: 83    General Examination: The patient is a very pleasant 83 y.o. female in no acute distress. She appears well-developed and well-nourished and well groomed. She is mildly anxious appearing. Appears more frail.  HEENT: Normocephalic, atraumatic, pupils are equal, round and reactive to light and accommodation. Hearing is grossly intact. Extraocular tracking is good without nystagmus. She has a moderate degree of head tremor, no-no type, stable from last time. She has a mild degree of voice tremor. She is not dysarthric. Oropharynx is clear. She has mild airway crowding. Neck is otherwise supple with full range of motion.   Chest: Clear to  auscultation without wheezing, rhonchi or crackles noted.  Heart: S1+S2+0, regular and normal without murmurs, rubs or gallops noted.   Abdomen: Soft, non-tender and non-distended with normal bowel  sounds appreciated on auscultation.  Extremities: There is  trace pitting edema, right more than left around the ankles.   Skin: Warm and dry without trophic changes noted. There are no varicose veins.  Musculoskeletal: exam reveals no obvious joint deformities, tenderness or joint swelling or erythema, with the exception of left-sided low back pain in the midline reported with radiation to the left hip and thigh posteriorly.   Neurologically:  Mental status: The patient is awake, alert and oriented in all 4 spheres. His memory, attention, language and knowledge are appropriate. There is no aphasia, agnosia, apraxia or anomia. Speech is clear with normal prosody and enunciation. Thought process is linear. Mood is congruent and affect is normal.  Cranial nerves are as described above under HEENT exam. In addition, shoulder shrug is normal with equal shoulder height noted. Motor exam: thin bulk, global strength of 4 out of 5, reflexes are 1+, absent in both ankles. Fine motor skills are globally mildly impaired in the upper and lower extremities, Romberg not tested for safety. Bilateral upper extremity tremors including a mild resting tremor in the right upper extremity, postural tremor both upper extremities and action tremor in the moderate degree in both upper extremities. Cerebellar testing shows no dysmetria or intention tremor.  Sensory exam: intact to light touch.   Gait, station and balance: She stands up slowly  and requires assistance, walks slowly and cautiously, tandem walk is not possible, balance is impaired. She has a mildly stooped posture.   Assessment and Plan:   In summary, [name] [name] is a very pleasant 83 year old female with a history of obesity, previous DVT, ET, stroke, partial seizure in the past, who presents for a sooner than scheduled appointment because of worsening tremor reported. She has also noticed more fatigue, suffers from low back pain with radiation to  the left, had a recent MRI for this and a pending appointment for what sounds like epidural steroid injection. Unfortunately, her situation is complicated because of her multiple issues and multiple medications, normal aging, pain and fatigue. Unfortunately, there is not a whole lot we can do to make things better for her from the neurological standpoint. She is encouraged to continue with Keppra twice a day, low-dose gabapentin twice a day, Mysoline twice a day. I did not recommend that she increase this because she is on a high dose and because of her age she would be at risk for side effects if we tried to increase this at this time. For her back pain she is advised to follow instructions from the orthopedic and rheumatological providers. She did not need any refills today. I suggested a six-month checkup, sooner as needed. I answered all their questions today and the patient and her son were in agreement.  I spent 15 minutes in total face-to-face time with the patient, more than 50% of which was spent in counseling and coordination of care, reviewing test results, reviewing medication and discussing or reviewing the diagnosis of ET, its prognosis and treatment options. Pertinent laboratory and imaging test results that were available during this visit with the patient were reviewed by me and considered in my medical decision making (see chart for details).

## 2017-07-08 NOTE — Patient Instructions (Signed)
Your exam is overall stable. Unfortunately, tremors to get worse with time. I would not recommend increasing your tremor medication as you are on a high-dose already. I think you are struggling with multiple issues: having had a stroke in the past, seizure disorder, taking multiple medications and suffering from fatigue and physical exhaustion, in addition to normal aging, and now more low back pain as well. Hopefully your injection to the lower back this week will help. Please continue to try to stay active as much as you can.  We will see you back in 6 months for recheck. We will keep all your medications the same at this time.

## 2017-07-11 ENCOUNTER — Ambulatory Visit (INDEPENDENT_AMBULATORY_CARE_PROVIDER_SITE_OTHER): Payer: Medicare Other

## 2017-07-11 ENCOUNTER — Ambulatory Visit (INDEPENDENT_AMBULATORY_CARE_PROVIDER_SITE_OTHER): Payer: Medicare Other | Admitting: Physical Medicine and Rehabilitation

## 2017-07-11 ENCOUNTER — Encounter (INDEPENDENT_AMBULATORY_CARE_PROVIDER_SITE_OTHER): Payer: Self-pay | Admitting: Physical Medicine and Rehabilitation

## 2017-07-11 VITALS — BP 135/67 | HR 83

## 2017-07-11 DIAGNOSIS — M545 Low back pain, unspecified: Secondary | ICD-10-CM

## 2017-07-11 DIAGNOSIS — M47816 Spondylosis without myelopathy or radiculopathy, lumbar region: Secondary | ICD-10-CM | POA: Diagnosis not present

## 2017-07-11 DIAGNOSIS — G8929 Other chronic pain: Secondary | ICD-10-CM

## 2017-07-11 DIAGNOSIS — G894 Chronic pain syndrome: Secondary | ICD-10-CM

## 2017-07-11 MED ORDER — LIDOCAINE HCL (PF) 1 % IJ SOLN
2.0000 mL | Freq: Once | INTRAMUSCULAR | Status: AC
  Administered 2017-07-11: 2 mL

## 2017-07-11 MED ORDER — METHYLPREDNISOLONE ACETATE 80 MG/ML IJ SUSP
80.0000 mg | Freq: Once | INTRAMUSCULAR | Status: AC
  Administered 2017-07-11: 80 mg

## 2017-07-11 NOTE — Patient Instructions (Signed)

## 2017-07-11 NOTE — Progress Notes (Deleted)
Pain across lower back and pain down leg to foot. Pain with walking and standing. Ambulates with cane. Numbness and heavy feeling in right foot.

## 2017-07-15 ENCOUNTER — Encounter (INDEPENDENT_AMBULATORY_CARE_PROVIDER_SITE_OTHER): Payer: Self-pay | Admitting: Physical Medicine and Rehabilitation

## 2017-07-15 NOTE — Procedures (Signed)
Mrs. Borgelt is a pleasant 83 year old female accompanied by her husband who provides a lot of the history and does interpret today. She's been having mostly left-sided low back pain and what she refers to as hip pain. Chest ambulate with a cane and has most of her pain with going from sit to stand and with standing. She has no pain sitting. She also has a separate symptom but it is on the right and it's in the right foot with numbness and heavy feeling of the right foot. She has no radicular complaints however. MRI performed recently this month shows severe facet arthropathy at L4-5 and L5-S1 with a listhesis of L4 on L5 which is very minimal and there is no central canal stenosis and no foraminal stenosis. There is no focal herniated disc. I think her back pain is facet mediated we are going to perform a diagnostic and hopefully therapeutic facet joint block at L4-5 and L5-S1. In terms of her foot being numb I don't see any spine source for that. She'll continue to follow up with Dr. Titus Dubin. They may want to have her seen by a neurologist for electrodiagnostic study.  Lumbar Facet Joint Intra-Articular Injection(s) with Fluoroscopic Guidance  Patient: [name] [name]      Date of Birth: [birth year] MRN: [id number] PCP: Garth Bigness, MD      Visit Date: 07/11/2017   Universal Protocol:    Date/Time: 07/11/2017  Consent Given By: the patient  Position: PRONE   Additional Comments: Vital signs were monitored before and after the procedure. Patient was prepped and draped in the usual sterile fashion. The correct patient, procedure, and site was verified.   Injection Procedure Details:  Procedure Site One Meds Administered:  Meds ordered this encounter  Medications  . lidocaine (PF) (XYLOCAINE) 1 % injection 2 mL  . methylPREDNISolone acetate (DEPO-MEDROL) injection 80 mg     Laterality: Right  Location/Site:  L4-L5 L5-S1  Needle size: 22 guage  Needle type:  Spinal  Needle Placement: Articular  Findings:  -Contrast Used: 1 mL iohexol 180 mg iodine/mL   -Comments: Excellent flow of contrast producing a partial arthrogram.  Procedure Details: The fluoroscope beam is vertically oriented in AP, and the inferior recess is visualized beneath the lower pole of the inferior apophyseal process, which represents the target point for needle insertion. When direct visualization is difficult the target point is located at the medial projection of the vertebral pedicle. The region overlying each aforementioned target is locally anesthetized with a 1 to 2 ml. volume of 1% Lidocaine without Epinephrine.   The spinal needle was inserted into each of the above mentioned facet joints using biplanar fluoroscopic guidance. A 0.25 to 0.5 ml. volume of Isovue-250 was injected and a partial facet joint arthrogram was obtained. A single spot film was obtained of the resulting arthrogram.    One to 1.25 ml of the steroid/anesthetic solution was then injected into each of the facet joints noted above.   Additional Comments:  The patient tolerated the procedure well No complications occurred Dressing: Band-Aid    Post-procedure details: Patient was observed during the procedure. Post-procedure instructions were reviewed.  Patient left the clinic in stable condition.

## 2017-08-22 ENCOUNTER — Other Ambulatory Visit: Payer: Self-pay | Admitting: Family Medicine

## 2017-09-02 ENCOUNTER — Other Ambulatory Visit: Payer: Self-pay | Admitting: Neurology

## 2017-09-02 DIAGNOSIS — G40109 Localization-related (focal) (partial) symptomatic epilepsy and epileptic syndromes with simple partial seizures, not intractable, without status epilepticus: Secondary | ICD-10-CM

## 2017-09-11 ENCOUNTER — Telehealth (INDEPENDENT_AMBULATORY_CARE_PROVIDER_SITE_OTHER): Payer: Self-pay | Admitting: Physical Medicine and Rehabilitation

## 2017-09-11 NOTE — Telephone Encounter (Signed)
Yes if helped alot 

## 2017-09-12 NOTE — Telephone Encounter (Signed)
As per injection note the has mainly sever facet OA and we did facet injections with no relief, she had numbness in foot but no spine reason for this. She should follow with Deveshwar to see if regrouping with PT or spine surgery eval or medications may help. She might need neurology to look at numbness in foot

## 2017-09-12 NOTE — Telephone Encounter (Signed)
Injection did not help at all. OV with you vs follow up with referring?

## 2017-09-12 NOTE — Telephone Encounter (Signed)
Please see messages below. Can you call patient's son to schedule follow up with Dr. Corliss Skainseveshwar?

## 2017-09-22 ENCOUNTER — Telehealth (INDEPENDENT_AMBULATORY_CARE_PROVIDER_SITE_OTHER): Payer: Self-pay | Admitting: Rheumatology

## 2017-09-22 NOTE — Telephone Encounter (Signed)
Left message on machine for patient's son to call back to schedule appointment.

## 2017-09-24 NOTE — Telephone Encounter (Signed)
I called the patient's son to let him know that we have made an appointment for his mother for Friday, November 2 @8 :30.

## 2017-09-24 NOTE — Progress Notes (Signed)
Office Visit Note  Patient: [name] [name]             Date of Birth: 08/31/1930           MRN: [id number]             PCP: Garth Bigness, MD Referring: Garth Bigness, MD Visit Date: 09/26/2017 Occupation: @GUAROCC @    Subjective:  Lower back pain   History of Present Illness: [name] [name] is a 83 y.o. female with disc disease of lumbar spine and osteoporosis. She was seen by Dr. Alvester Morin and had facet joint injections without much relief. She continues to have some lower back discomfort. Dr. Alvester Morin believes it may not be related to her spine. He recommended evaluation by a spine specialist or neurologist. He also recommended doing physical therapy.   Activities of Daily Living:  Patient reports morning stiffness for 0 minute.   Patient Denies nocturnal pain.  Difficulty dressing/grooming: Reports Difficulty climbing stairs: Reports Difficulty getting out of chair: Reports Difficulty using hands for taps, buttons, cutlery, and/or writing: Denies   Review of Systems  Constitutional: Positive for fatigue. Negative for night sweats, weight gain, weight loss and weakness.  HENT: Negative for mouth sores, trouble swallowing, trouble swallowing, mouth dryness and nose dryness.   Eyes: Negative for pain, redness, visual disturbance and dryness.  Respiratory: Negative for cough, shortness of breath and difficulty breathing.   Cardiovascular: Positive for hypertension. Negative for chest pain, palpitations, irregular heartbeat and swelling in legs/feet.  Gastrointestinal: Negative for blood in stool, constipation and diarrhea.  Endocrine: Negative for increased urination.  Genitourinary: Negative for vaginal dryness.  Musculoskeletal: Positive for arthralgias, joint pain, myalgias and myalgias. Negative for joint swelling, muscle weakness, morning stiffness and muscle tenderness.  Skin: Negative for color change, rash, hair loss, skin tightness, ulcers and sensitivity to  sunlight.  Allergic/Immunologic: Negative for susceptible to infections.  Neurological: Negative for dizziness, memory loss and night sweats.  Hematological: Negative for swollen glands.  Psychiatric/Behavioral: Negative for depressed mood and sleep disturbance. The patient is not nervous/anxious.     PMFS History:  Patient Active Problem List   Diagnosis Date Noted  . Lumbar pain 10/10/2016  . Pelvic pain 10/10/2016  . Seizures (HCC) 08/06/2014  . Abnormal involuntary movement 08/05/2014  . Hemiplegia, unspecified, affecting dominant side 08/05/2014  . Right sided weakness 08/05/2014  . CVA (cerebral vascular accident) (HCC) 07/21/2014  . Left leg pain 07/20/2014  . HTN (hypertension) 07/18/2014  . HLD (hyperlipidemia) 07/18/2014  . Thrombocytopenia (HCC) 07/18/2014  . Essential tremor 10/27/2013  . Obesity, unspecified 10/27/2013  . DM2 (diabetes mellitus, type 2) (HCC) 10/27/2013  . History of peripheral neuropathy 10/27/2013    Past Medical History:  Diagnosis Date  . Anxiety   . Diabetes (HCC) 10/27/2013  . DVT (deep venous thrombosis) (HCC)   . Essential tremor 10/27/2013  . High cholesterol 1999  . Hypertension   . Ischemic stroke (HCC) 07/18/2014  . Obesity, unspecified 10/27/2013  . Pneumonia   . Unspecified hereditary and idiopathic peripheral neuropathy 10/27/2013    Family History  Problem Relation Age of Onset  . Stroke Mother   . Heart Problems Brother   . Coronary artery disease Other   . Heart Problems Sister    Past Surgical History:  Procedure Laterality Date  . CATARACT EXTRACTION Bilateral   . FOOT SURGERY     Social History   Social History Narrative   Patient is right handed and resides with son  Objective: Vital Signs: BP (!) 143/62 (BP Location: Left Arm, Patient Position: Sitting, Cuff Size: Normal)   Pulse 73   Resp 20   Ht 5' (1.524 m)   Wt 155 lb (70.3 kg)   BMI 30.27 kg/m    Physical Exam  Constitutional: She is oriented  to person, place, and time. She appears well-developed and well-nourished.  HENT:  Head: Normocephalic and atraumatic.  Eyes: Conjunctivae and EOM are normal.  Neck: Normal range of motion.  Cardiovascular: Normal rate, regular rhythm, normal heart sounds and intact distal pulses.   Pulmonary/Chest: Effort normal and breath sounds normal.  Abdominal: Soft. Bowel sounds are normal.  Lymphadenopathy:    She has no cervical adenopathy.  Neurological: She is alert and oriented to person, place, and time.  Right-sided hemiparesis  Skin: Skin is warm and dry. Capillary refill takes less than 2 seconds.  Psychiatric: She has a normal mood and affect. Her behavior is normal.  Nursing note and vitals reviewed.    Musculoskeletal Exam: C-spine good range of motion. She has thoracic kyphosis. She is very limited range of motion of her lumbar spine with tenderness in the lower lumbar region. She has good range of motion of her shoulders, elbows, wrist joints, MCPs PIPs and DIPs with no synovitis. Hip joints are good range of motion. 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  CDAI Exam: No CDAI exam completed.    Investigation: No additional findings. CBC Latest Ref Rng & Units 06/01/2017 10/09/2016 08/05/2014  WBC 4.0 - 10.5 K/uL 5.4 4.2 6.0  Hemoglobin 12.0 - 15.0 g/dL 16.1 09.6 04.5  Hematocrit 36.0 - 46.0 % 45.0 43.1 42.6  Platelets 150 - 400 K/uL 108(L) 120(L) 145(L)   CMP Latest Ref Rng & Units 06/01/2017 11/11/2016 10/09/2016  Glucose 65 - 99 mg/dL 409(W) 71 84  BUN 6 - 20 mg/dL 11 12 10   Creatinine 0.44 - 1.00 mg/dL 1.19 1.47 8.29  Sodium 135 - 145 mmol/L 138 142 140  Potassium 3.5 - 5.1 mmol/L 4.5 4.6 4.2  Chloride 101 - 111 mmol/L 104 102 105  CO2 22 - 32 mmol/L 26 27 28   Calcium 8.9 - 10.3 mg/dL 9.1 8.9 8.9  Total Protein 6.0 - 8.5 g/dL - 6.7 -  Total Bilirubin 0.0 - 1.2 mg/dL - 0.3 -  Alkaline Phos 39 - 117 IU/L - 76 -  AST 0 - 40 IU/L - 23 -  ALT 0 - 32  IU/L - 19 -    Imaging: No results found.  Speciality Comments: No specialty comments available.    Procedures:  No procedures performed Allergies: Tape   Assessment / Plan:     Visit Diagnoses: Age-related osteoporosis without current pathological fracture -  DEXA 04/14/2015 T -2.7 L femoral neck, BMD 0.667 at [facility]. Reclast was discussed during the last visit in July 2018. Have advised patient to schedule DEXA which has not been scheduled yet.  History of vertebral fracture  Lower back pain she has significant disc disease with facet joint arthropathy. She had injection in the facet joint per Dr. Alvester Morin with inadequate response. She continues to have a lot of discomfort especially from sitting to standing position and with mobility. I will refer her to Dr. Otelia Sergeant. I also offered physical therapy but she declined.  DDD (degenerative disc disease), cervical: Doing fairly well  DDD (degenerative disc disease), thoracic: She is significant kyphosis  DDD (degenerative disc disease), lumbar: Increased pain  Other medical problems are  listed as follows:  History of hyperlipidemia  History of hypertension  History of DVT (deep vein thrombosis) - and Pulmonary Embolism  History of CVA (cerebrovascular accident)  History of gastroesophageal reflux (GERD)    Orders: Orders Placed This Encounter  Procedures  . AMB referral to orthopedics   No orders of the defined types were placed in this encounter.    Follow-Up Instructions: Return for Osteoarthritis, DDD,  Osteoporosis.   Pollyann Savoy, MD  Note - This record has been created using Animal nutritionist.  Chart creation errors have been sought, but may not always  have been located. Such creation errors do not reflect on  the standard of medical care.

## 2017-09-26 ENCOUNTER — Encounter: Payer: Self-pay | Admitting: Rheumatology

## 2017-09-26 ENCOUNTER — Ambulatory Visit (INDEPENDENT_AMBULATORY_CARE_PROVIDER_SITE_OTHER): Payer: Medicare Other | Admitting: Rheumatology

## 2017-09-26 VITALS — BP 143/62 | HR 73 | Resp 20 | Ht 60.0 in | Wt 155.0 lb

## 2017-09-26 DIAGNOSIS — Z86718 Personal history of other venous thrombosis and embolism: Secondary | ICD-10-CM | POA: Diagnosis not present

## 2017-09-26 DIAGNOSIS — Z8673 Personal history of transient ischemic attack (TIA), and cerebral infarction without residual deficits: Secondary | ICD-10-CM | POA: Diagnosis not present

## 2017-09-26 DIAGNOSIS — Z8639 Personal history of other endocrine, nutritional and metabolic disease: Secondary | ICD-10-CM

## 2017-09-26 DIAGNOSIS — M5136 Other intervertebral disc degeneration, lumbar region: Secondary | ICD-10-CM

## 2017-09-26 DIAGNOSIS — Z8719 Personal history of other diseases of the digestive system: Secondary | ICD-10-CM

## 2017-09-26 DIAGNOSIS — M5030 Other cervical disc degeneration, unspecified cervical region: Secondary | ICD-10-CM

## 2017-09-26 DIAGNOSIS — Z8679 Personal history of other diseases of the circulatory system: Secondary | ICD-10-CM | POA: Diagnosis not present

## 2017-09-26 DIAGNOSIS — Z8781 Personal history of (healed) traumatic fracture: Secondary | ICD-10-CM | POA: Diagnosis not present

## 2017-09-26 DIAGNOSIS — M5134 Other intervertebral disc degeneration, thoracic region: Secondary | ICD-10-CM

## 2017-09-26 DIAGNOSIS — M810 Age-related osteoporosis without current pathological fracture: Secondary | ICD-10-CM

## 2017-10-30 ENCOUNTER — Other Ambulatory Visit: Payer: Self-pay

## 2017-10-30 ENCOUNTER — Ambulatory Visit (INDEPENDENT_AMBULATORY_CARE_PROVIDER_SITE_OTHER): Payer: Medicare Other | Admitting: Family Medicine

## 2017-10-30 ENCOUNTER — Encounter: Payer: Self-pay | Admitting: Family Medicine

## 2017-10-30 VITALS — BP 142/70 | HR 63 | Temp 97.6°F | Ht 60.0 in | Wt 157.2 lb

## 2017-10-30 DIAGNOSIS — K429 Umbilical hernia without obstruction or gangrene: Secondary | ICD-10-CM | POA: Diagnosis not present

## 2017-10-30 DIAGNOSIS — R1033 Periumbilical pain: Secondary | ICD-10-CM

## 2017-10-30 MED ORDER — POLYETHYLENE GLYCOL 3350 17 GM/SCOOP PO POWD: 17.0000 g | Freq: Every day | ORAL | 3 refills | Status: DC

## 2017-10-30 NOTE — Progress Notes (Signed)
   Subjective:    Patient ID: [name] HoleFatemeh B Cooperman , female   DOB: 1929/12/22 , 83 y.o..   MRN: [id number]  HPI  [name] [name] is here for  Chief Complaint  Patient presents with  . Problem with Belly Button    1. Belly button pain: Patient is here with her son today.  Her son is interpreting for her as she only understands some [place].  Patient endorses some pain in her bellybutton area whenever she strains using the restroom, coughs, or goes from a laying to sitting position.  She endorses that the pain does not occur at rest usually.  The pain feels "heavy".  She admits to hold hard stools but notes that she has a bowel movement daily.  Denies any nausea, vomiting, diarrhea, dysuria, increased urinary frequency, hematuria.  Denies any previous abdominal surgeries.  Review of Systems: Per HPI.  Past Medical History: Patient Active Problem List   Diagnosis Date Noted  . Umbilical pain 10/31/2017  . Lumbar pain 10/10/2016  . Pelvic pain 10/10/2016  . Seizures (HCC) 08/06/2014  . Abnormal involuntary movement 08/05/2014  . Hemiplegia, unspecified, affecting dominant side 08/05/2014  . Right sided weakness 08/05/2014  . CVA (cerebral vascular accident) (HCC) 07/21/2014  . Left leg pain 07/20/2014  . HTN (hypertension) 07/18/2014  . HLD (hyperlipidemia) 07/18/2014  . Thrombocytopenia (HCC) 07/18/2014  . Essential tremor 10/27/2013  . Obesity, unspecified 10/27/2013  . DM2 (diabetes mellitus, type 2) (HCC) 10/27/2013  . History of peripheral neuropathy 10/27/2013    Medications: reviewed a  Social Hx:  reports that  has never smoked. she has never used smokeless tobacco.   Objective:   BP (!) 142/70   Pulse 63   Temp 97.6 F (36.4 C) (Oral)   Ht 5' (1.524 m)   Wt 157 lb 3.2 oz (71.3 kg)   SpO2 97%   BMI 30.70 kg/m  Physical Exam  Gen: NAD, alert, cooperative with exam, well-appearing Gastrointestinal: soft, hypoactive bowel sounds, tender to palpation in umbilical  area, but no tenderness in any other area. Umbilical hernia, reducible, appreciated.  Psych: good insight, normal mood and affect  Assessment & Plan:  Umbilical pain Exam showing umbilical hernia. Likely source of patient's pain. Hernia is reducible and only tender with valsalva and on palpation.  - Encouraged daily Miralax use to ease strain when stooling - Refer to general surgery  Orders Placed This Encounter  Procedures  . Ambulatory referral to General Surgery    Referral Priority:   Routine    Referral Type:   Surgical    Referral Reason:   Specialty Services Required    Requested Specialty:   General Surgery    Number of Visits Requested:   1   Meds ordered this encounter  Medications  . polyethylene glycol powder (GLYCOLAX/MIRALAX) powder    Sig: Take 17 g by mouth daily.    Dispense:  500 g    Refill:  3    Anders Simmondshristina Gambino, MD [facility], PGY-3

## 2017-10-30 NOTE — Patient Instructions (Signed)
Thank you for coming in today, it was so nice to see you! Today we talked about:    Umbilical hernia: This is what is causing your pain  Please Take Miralax every day  I have referred you to the surgeon for more evaluation, someone will call you in the next week to schedule this. If you don't hear from anyone in the next week, please call the clinic.  Get help right away if:  You develop sudden, severe pain near the area of your hernia.  You have pain as well as nausea or vomiting.  You have pain and the skin over your hernia changes color.  You develop a fever.  If you have any questions or concerns, please do not hesitate to call the office at [phone]. You can also message me directly via MyChart.   Sincerely,  Anders Simmondshristina Latiffany Harwick, MD    Umbilical Hernia, Adult A hernia is a bulge of tissue that pushes through an opening between muscles. An umbilical hernia happens in the abdomen, near the belly button (umbilicus). The hernia may contain tissues from the small intestine, large intestine, or fatty tissue covering the intestines (omentum). Umbilical hernias in adults tend to get worse over time, and they require surgical treatment. There are several types of umbilical hernias. You may have:  A hernia located just above or below the umbilicus (indirect hernia). This is the most common type of umbilical hernia in adults.  A hernia that forms through an opening formed by the umbilicus (direct hernia).  A hernia that comes and goes (reducible hernia). A reducible hernia may be visible only when you strain, lift something heavy, or cough. This type of hernia can be pushed back into the abdomen (reduced).  A hernia that traps abdominal tissue inside the hernia (incarcerated hernia). This type of hernia cannot be reduced.  A hernia that cuts off blood flow to the tissues inside the hernia (strangulated hernia). The tissues can start to die if this happens. This type of hernia  requires emergency treatment.  What are the causes? An umbilical hernia happens when tissue inside the abdomen presses on a weak area of the abdominal muscles. What increases the risk? You may have a greater risk of this condition if you:  Are obese.  Have had several pregnancies.  Have a buildup of fluid inside your abdomen (ascites).  Have had surgery that weakens the abdominal muscles.  What are the signs or symptoms? The main symptom of this condition is a painless bulge at or near the belly button. A reducible hernia may be visible only when you strain, lift something heavy, or cough. Other symptoms may include:  Dull pain.  A feeling of pressure.  Symptoms of a strangulated hernia may include:  Pain that gets increasingly worse.  Nausea and vomiting.  Pain when pressing on the hernia.  Skin over the hernia becoming red or purple.  Constipation.  Blood in the stool.  How is this diagnosed? This condition may be diagnosed based on:  A physical exam. You may be asked to cough or strain while standing. These actions increase the pressure inside your abdomen and force the hernia through the opening in your muscles. Your health care provider may try to reduce the hernia by pressing on it.  Your symptoms and medical history.  How is this treated? Surgery is the only treatment for an umbilical hernia. Surgery for a strangulated hernia is done as soon as possible. If you have a small  hernia that is not incarcerated, you may need to lose weight before having surgery. Follow these instructions at home:  Lose weight, if told by your health care provider.  Do not try to push the hernia back in.  Watch your hernia for any changes in color or size. Tell your health care provider if any changes occur.  You may need to avoid activities that increase pressure on your hernia.  Do not lift anything that is heavier than 10 lb (4.5 kg) until your health care provider says that  this is safe.  Take over-the-counter and prescription medicines only as told by your health care provider.  Keep all follow-up visits as told by your health care provider. This is important. Contact a health care provider if:  Your hernia gets larger.  Your hernia becomes painful. Get help right away if:  You develop sudden, severe pain near the area of your hernia.  You have pain as well as nausea or vomiting.  You have pain and the skin over your hernia changes color.  You develop a fever. This information is not intended to replace advice given to you by your health care provider. Make sure you discuss any questions you have with your health care provider. Document Released: 04/12/2016 Document Revised: 07/14/2016 Document Reviewed: 04/12/2016 Elsevier Interactive Patient Education  [employer]2018 Elsevier Inc.

## 2017-10-31 ENCOUNTER — Encounter (INDEPENDENT_AMBULATORY_CARE_PROVIDER_SITE_OTHER): Payer: Self-pay | Admitting: Specialist

## 2017-10-31 ENCOUNTER — Ambulatory Visit (INDEPENDENT_AMBULATORY_CARE_PROVIDER_SITE_OTHER): Payer: Medicare Other | Admitting: Specialist

## 2017-10-31 VITALS — BP 137/62 | HR 77 | Ht 60.0 in | Wt 157.0 lb

## 2017-10-31 DIAGNOSIS — R1033 Periumbilical pain: Secondary | ICD-10-CM | POA: Insufficient documentation

## 2017-10-31 DIAGNOSIS — M48062 Spinal stenosis, lumbar region with neurogenic claudication: Secondary | ICD-10-CM

## 2017-10-31 DIAGNOSIS — M4316 Spondylolisthesis, lumbar region: Secondary | ICD-10-CM

## 2017-10-31 NOTE — Assessment & Plan Note (Signed)
Exam showing umbilical hernia. Likely source of patient's pain. Hernia is reducible and only tender with valsalva and on palpation.  - Encouraged daily Miralax use to ease strain when stooling - Refer to general surgery

## 2017-10-31 NOTE — Progress Notes (Signed)
Office Visit Note   Patient: [name] [name]           Date of Birth: [birth year]           MRN: [id number] Visit Date: 10/31/2017              Requested by: Garth Bignessimberlake, Kathryn, MD [address] [place], [place] [address] PCP: Garth Bignessimberlake, Kathryn, MD   Assessment & Plan: Visit Diagnoses:  1. Spondylolisthesis, lumbar region   2. Spinal stenosis of lumbar region with neurogenic claudication     Plan: Avoid bending, stooping and avoid lifting weights greater than 10 lbs. Avoid prolong standing and walking. Avoid frequent bending and stooping  No lifting greater than 10 lbs. May use ice or moist heat for pain. Weight loss is of benefit. Handicap license is approved. Dr. Marcus Hook BlasNewton's secretary/Assistant will call to arrange for epidural steroid injection  Tylenol ES 500-650 mg up to 3-4 times 5 day for pain. Physical Therapy spinal stenosis and spondylolisthesis, core strengthening, LE strengthening.  Foam wedge for bed to decrease pain while sleeping Follow-Up Instructions: Return in about 4 weeks (around 11/28/2017).   Orders:  No orders of the defined types were placed in this encounter.  No orders of the defined types were placed in this encounter.     Procedures: No procedures performed   Clinical Data: No additional findings.   Subjective: Chief Complaint  Patient presents with  . Lower Back - Pain    83 year old female with one year history of back pain that is sharp, worse with standing and walking. The pain is in the left anterior proximal thigh and inguinal or groin pain and it prevents her from walking much. She does not experience numbness in the left leg. Has a history of left arm numbness and weakness since a stroke a couple of years ago. She has seen Dr. Frances FurbishAthar, neurology and been evaluated for the post stroke UE. The left upper thigh pain is trouble some with stair climbing and able to reach the left shoe and shoe. She has pain with reaching for her shoe and  sock. Using the cane helps the pain in the left thigh. She notices the pain at night when try to sleep. Lying on the left side is painful and on the right is not as painful. No bowel or bladder difficulties. She tends to lean on the basket or buggy when shopping. No weight gain or loss.     Review of Systems  Constitutional: Positive for activity change. Negative for appetite change, chills, diaphoresis, fatigue and unexpected weight change.  HENT: Negative for congestion, dental problem, drooling, ear discharge, ear pain, hearing loss, mouth sores, nosebleeds, postnasal drip, rhinorrhea, sinus pressure, sinus pain, sneezing, sore throat, tinnitus, trouble swallowing and voice change.   Eyes: Negative for photophobia, pain, discharge, redness, itching and visual disturbance.  Respiratory: Positive for cough and choking. Negative for apnea, chest tightness, shortness of breath, wheezing and stridor.   Cardiovascular: Negative.  Negative for chest pain, palpitations and leg swelling.  Gastrointestinal: Positive for abdominal pain. Negative for abdominal distention, anal bleeding, blood in stool, constipation, diarrhea, nausea, rectal pain and vomiting.  Endocrine: Positive for cold intolerance. Negative for heat intolerance, polydipsia, polyphagia and polyuria.  Genitourinary: Negative for difficulty urinating, dyspareunia, dysuria, enuresis, flank pain, frequency and hematuria.  Musculoskeletal: Positive for back pain, gait problem, neck pain and neck stiffness. Negative for arthralgias, joint swelling and myalgias.  Skin: Negative.  Negative for color change,  pallor, rash and wound.  Allergic/Immunologic: Negative.  Negative for environmental allergies, food allergies and immunocompromised state.  Neurological: Positive for seizures, syncope and weakness. Negative for dizziness, tremors, facial asymmetry, speech difficulty, light-headedness, numbness and headaches.  Hematological: Negative.   Negative for adenopathy. Does not bruise/bleed easily.  Psychiatric/Behavioral: Negative.  Negative for agitation, behavioral problems, confusion, decreased concentration, dysphoric mood, hallucinations, self-injury, sleep disturbance and suicidal ideas. The patient is not nervous/anxious and is not hyperactive.      Objective: Vital Signs: BP 137/62   Pulse 77   Ht 5' (1.524 m)   Wt 157 lb (71.2 kg)   BMI 30.66 kg/m   Physical Exam  Constitutional: She is oriented to person, place, and time. She appears well-developed and well-nourished.  HENT:  Head: Normocephalic and atraumatic.  Eyes: EOM are normal. Pupils are equal, round, and reactive to light.  Neck: Normal range of motion. Neck supple.  Pulmonary/Chest: Effort normal and breath sounds normal.  Abdominal: Soft. Bowel sounds are normal.  Neurological: She is alert and oriented to person, place, and time.  Skin: Skin is warm and dry.  Psychiatric: She has a normal mood and affect. Her behavior is normal. Judgment and thought content normal.    Back Exam   Tenderness  The patient is experiencing tenderness in the lumbar.  Range of Motion  Extension: abnormal  Flexion: abnormal  Lateral bend right: abnormal  Rotation right: abnormal  Rotation left: abnormal   Muscle Strength  Right Quadriceps:  5/5  Left Quadriceps:  4/5  Right Hamstrings:  5/5  Left Hamstrings:  5/5   Tests  Straight leg raise right: negative Straight leg raise left: negative  Reflexes  Patellar:  Hyporeflexic normal Achilles:  Hyporeflexic normal Babinski's sign: normal   Other  Toe walk: abnormal Heel walk: abnormal Gait: abnormal  Erythema: no back redness Scars: absent      Specialty Comments:  No specialty comments available.  Imaging: No results found.   PMFS History: Patient Active Problem List   Diagnosis Date Noted  . Lumbar pain 10/10/2016  . Pelvic pain 10/10/2016  . Seizures (HCC) 08/06/2014  . Abnormal  involuntary movement 08/05/2014  . Hemiplegia, unspecified, affecting dominant side 08/05/2014  . Right sided weakness 08/05/2014  . CVA (cerebral vascular accident) (HCC) 07/21/2014  . Left leg pain 07/20/2014  . HTN (hypertension) 07/18/2014  . HLD (hyperlipidemia) 07/18/2014  . Thrombocytopenia (HCC) 07/18/2014  . Essential tremor 10/27/2013  . Obesity, unspecified 10/27/2013  . DM2 (diabetes mellitus, type 2) (HCC) 10/27/2013  . History of peripheral neuropathy 10/27/2013   Past Medical History:  Diagnosis Date  . Anxiety   . Diabetes (HCC) 10/27/2013  . DVT (deep venous thrombosis) (HCC)   . Essential tremor 10/27/2013  . High cholesterol 1999  . Hypertension   . Ischemic stroke (HCC) 07/18/2014  . Obesity, unspecified 10/27/2013  . Pneumonia   . Unspecified hereditary and idiopathic peripheral neuropathy 10/27/2013    Family History  Problem Relation Age of Onset  . Stroke Mother   . Heart Problems Brother   . Coronary artery disease Other   . Heart Problems Sister     Past Surgical History:  Procedure Laterality Date  . CATARACT EXTRACTION Bilateral   . FOOT SURGERY     Social History   Occupational History    Comment: does not work  Tobacco Use  . Smoking status: Never Smoker  . Smokeless tobacco: Never Used  Substance and Sexual Activity  . Alcohol  use: No    Alcohol/week: 0.0 oz  . Drug use: No  . Sexual activity: No

## 2017-10-31 NOTE — Patient Instructions (Addendum)
Avoid bending, stooping and avoid lifting weights greater than 10 lbs. Avoid prolong standing and walking. Avoid frequent bending and stooping  No lifting greater than 10 lbs. May use ice or moist heat for pain. Weight loss is of benefit. Handicap license is approved. Dr. [place] BlasNewton's secretary/Assistant will call to arrange for epidural steroid injection  Tylenol ES 500-650 mg up to 3-4 times 5 day for pain. Physical Therapy spinal stenosis and spondylolisthesis, core strengthening, LE strengthening.  Foam wedge for bed to decrease pain while sleeping.

## 2017-11-06 ENCOUNTER — Other Ambulatory Visit: Payer: Self-pay | Admitting: Family Medicine

## 2017-11-06 ENCOUNTER — Ambulatory Visit
Admission: RE | Admit: 2017-11-06 | Discharge: 2017-11-06 | Disposition: A | Discharge status: Home / Self Care | Payer: Medicare Other | Source: Ambulatory Visit | Attending: Internal Medicine | Admitting: Internal Medicine

## 2017-11-06 DIAGNOSIS — Z1231 Encounter for screening mammogram for malignant neoplasm of breast: Secondary | ICD-10-CM

## 2017-11-12 ENCOUNTER — Ambulatory Visit: Payer: Medicare Other | Admitting: Neurology

## 2017-11-23 NOTE — Progress Notes (Deleted)
Office Visit Note  Patient: [name] [name]             Date of Birth: Apr 18, 1930           MRN: [id number]             PCP: Garth Bigness, MD Referring: Garth Bigness, MD Visit Date: 12/04/2017 Occupation: @GUAROCC @    Subjective:  No chief complaint on file.   History of Present Illness: [name] [name] is a 83 y.o. female ***   Activities of Daily Living:  Patient reports morning stiffness for *** {minute/hour:19697}.   Patient {ACTIONS;DENIES/REPORTS:21021675::"Denies"} nocturnal pain.  Difficulty dressing/grooming: {ACTIONS;DENIES/REPORTS:21021675::"Denies"} Difficulty climbing stairs: {ACTIONS;DENIES/REPORTS:21021675::"Denies"} Difficulty getting out of chair: {ACTIONS;DENIES/REPORTS:21021675::"Denies"} Difficulty using hands for taps, buttons, cutlery, and/or writing: {ACTIONS;DENIES/REPORTS:21021675::"Denies"}   No Rheumatology ROS completed.   PMFS History:  Patient Active Problem List   Diagnosis Date Noted  . Umbilical pain 10/31/2017  . Lumbar pain 10/10/2016  . Pelvic pain 10/10/2016  . Seizures (HCC) 08/06/2014  . Abnormal involuntary movement 08/05/2014  . Hemiplegia, unspecified, affecting dominant side 08/05/2014  . Right sided weakness 08/05/2014  . CVA (cerebral vascular accident) (HCC) 07/21/2014  . Left leg pain 07/20/2014  . HTN (hypertension) 07/18/2014  . HLD (hyperlipidemia) 07/18/2014  . Thrombocytopenia (HCC) 07/18/2014  . Essential tremor 10/27/2013  . Obesity, unspecified 10/27/2013  . DM2 (diabetes mellitus, type 2) (HCC) 10/27/2013  . History of peripheral neuropathy 10/27/2013    Past Medical History:  Diagnosis Date  . Anxiety   . Diabetes (HCC) 10/27/2013  . DVT (deep venous thrombosis) (HCC)   . Essential tremor 10/27/2013  . High cholesterol 1999  . Hypertension   . Ischemic stroke (HCC) 07/18/2014  . Obesity, unspecified 10/27/2013  . Pneumonia   . Unspecified hereditary and idiopathic peripheral neuropathy  10/27/2013    Family History  Problem Relation Age of Onset  . Stroke Mother   . Heart Problems Brother   . Coronary artery disease Other   . Heart Problems Sister    Past Surgical History:  Procedure Laterality Date  . CATARACT EXTRACTION Bilateral   . FOOT SURGERY     Social History   Social History Narrative   Patient is right handed and resides with son     Objective: Vital Signs: There were no vitals taken for this visit.   Physical Exam   Musculoskeletal Exam: ***  CDAI Exam: No CDAI exam completed.    Investigation: No additional findings. CBC Latest Ref Rng & Units 06/01/2017 10/09/2016 08/05/2014  WBC 4.0 - 10.5 K/uL 5.4 4.2 6.0  Hemoglobin 12.0 - 15.0 g/dL 40.9 81.1 91.4  Hematocrit 36.0 - 46.0 % 45.0 43.1 42.6  Platelets 150 - 400 K/uL 108(L) 120(L) 145(L)   CMP Latest Ref Rng & Units 06/01/2017 11/11/2016 10/09/2016  Glucose 65 - 99 mg/dL 782(N) 71 84  BUN 6 - 20 mg/dL 11 12 10   Creatinine 0.44 - 1.00 mg/dL 5.62 1.30 8.65  Sodium 135 - 145 mmol/L 138 142 140  Potassium 3.5 - 5.1 mmol/L 4.5 4.6 4.2  Chloride 101 - 111 mmol/L 104 102 105  CO2 22 - 32 mmol/L 26 27 28   Calcium 8.9 - 10.3 mg/dL 9.1 8.9 8.9  Total Protein 6.0 - 8.5 g/dL - 6.7 -  Total Bilirubin 0.0 - 1.2 mg/dL - 0.3 -  Alkaline Phos 39 - 117 IU/L - 76 -  AST 0 - 40 IU/L - 23 -  ALT 0 - 32 IU/L - 19 -  Imaging: Mm Screening Breast Tomo Bilateral  Result Date: 11/07/2017 CLINICAL DATA:  Screening. EXAM: 2D DIGITAL SCREENING BILATERAL MAMMOGRAM WITH CAD AND ADJUNCT TOMO COMPARISON:  Previous exam(s). ACR Breast Density Category b: There are scattered areas of fibroglandular density. FINDINGS: There are no findings suspicious for malignancy. Images were processed with CAD. IMPRESSION: No mammographic evidence of malignancy. A result letter of this screening mammogram will be mailed directly to the patient. RECOMMENDATION: Screening mammogram in one year. (Code:SM-B-01Y) BI-RADS CATEGORY  1:  Negative. Electronically Signed   By: Frederico Hamman M.D.   On: 11/07/2017 08:34    Speciality Comments: No specialty comments available.    Procedures:  No procedures performed Allergies: Tape   Assessment / Plan:     Visit Diagnoses: No diagnosis found.    Orders: No orders of the defined types were placed in this encounter.  No orders of the defined types were placed in this encounter.   Face-to-face time spent with patient was *** minutes. 50% of time was spent in counseling and coordination of care.  Follow-Up Instructions: No Follow-up on file.   Ellen Henri, CMA  Note - This record has been created using Animal nutritionist.  Chart creation errors have been sought, but may not always  have been located. Such creation errors do not reflect on  the standard of medical care.

## 2017-11-26 ENCOUNTER — Ambulatory Visit (INDEPENDENT_AMBULATORY_CARE_PROVIDER_SITE_OTHER): Payer: Medicare Other | Admitting: Physical Medicine and Rehabilitation

## 2017-11-26 ENCOUNTER — Ambulatory Visit (INDEPENDENT_AMBULATORY_CARE_PROVIDER_SITE_OTHER): Payer: Medicare Other

## 2017-11-26 ENCOUNTER — Encounter (INDEPENDENT_AMBULATORY_CARE_PROVIDER_SITE_OTHER): Payer: Self-pay | Admitting: Physical Medicine and Rehabilitation

## 2017-11-26 VITALS — BP 121/71 | HR 67 | Temp 97.9°F

## 2017-11-26 DIAGNOSIS — M5416 Radiculopathy, lumbar region: Secondary | ICD-10-CM

## 2017-11-26 MED ORDER — BETAMETHASONE SOD PHOS & ACET 6 (3-3) MG/ML IJ SUSP
12.0000 mg | Freq: Once | INTRAMUSCULAR | Status: AC
  Administered 2017-11-26: 12 mg

## 2017-11-26 NOTE — Progress Notes (Deleted)
Pt states pain in lower back that radiates in left groin area. Pain increases when she stands, sitting makes the pain slightly better. Pain started a year ago. Pt states previous shot helped temporarily. +BT (plavix), +Driver, -Dye Allergy.

## 2017-11-26 NOTE — Patient Instructions (Signed)

## 2017-12-03 ENCOUNTER — Other Ambulatory Visit: Payer: Self-pay | Admitting: Neurology

## 2017-12-03 DIAGNOSIS — G40109 Localization-related (focal) (partial) symptomatic epilepsy and epileptic syndromes with simple partial seizures, not intractable, without status epilepticus: Secondary | ICD-10-CM

## 2017-12-04 ENCOUNTER — Ambulatory Visit: Payer: Medicare Other | Admitting: Rheumatology

## 2017-12-13 ENCOUNTER — Other Ambulatory Visit: Payer: Self-pay | Admitting: Family Medicine

## 2017-12-15 NOTE — Procedures (Signed)
[name] [name] is an 84 year old female with lower back pain radiating to the left groin.  We are going to complete a diagnostic and hopefully therapeutic left L4 transforaminal epidural steroid injection at the request of Dr. Otelia SergeantNitka.  The injection  will be diagnostic and hopefully therapeutic. The patient has failed conservative care including time, medications and activity modification.  Lumbosacral Transforaminal Epidural Steroid Injection - Sub-Pedicular Approach with Fluoroscopic Guidance  Patient: [name] [name]      Date of Birth: 1930-09-13 MRN: [id number] PCP: Garth Bignessimberlake, Kathryn, MD      Visit Date: 11/26/2017   Universal Protocol:    Date/Time: 11/26/2017  Consent Given By: the patient  Position: PRONE  Additional Comments: Vital signs were monitored before and after the procedure. Patient was prepped and draped in the usual sterile fashion. The correct patient, procedure, and site was verified.   Injection Procedure Details:  Procedure Site One Meds Administered:  Meds ordered this encounter  Medications  . betamethasone acetate-betamethasone sodium phosphate (CELESTONE) injection 12 mg    Laterality: Left  Location/Site:  L4-L5  Needle size: 22 G  Needle type: Spinal  Needle Placement: Transforaminal  Findings:    -Comments: Excellent flow of contrast along the nerve and into the epidural space.  Procedure Details: After squaring off the end-plates to get a true AP view, the C-arm was positioned so that an oblique view of the foramen as noted above was visualized. The target area is just inferior to the "nose of the scotty dog" or sub pedicular. The soft tissues overlying this structure were infiltrated with 2-3 ml. of 1% Lidocaine without Epinephrine.  The spinal needle was inserted toward the target using a "trajectory" view along the fluoroscope beam.  Under AP and lateral visualization, the needle was advanced so it did not puncture dura and was located  close the 6 O'Clock position of the pedical in AP tracterory. Biplanar projections were used to confirm position. Aspiration was confirmed to be negative for CSF and/or blood. A 1-2 ml. volume of Isovue-250 was injected and flow of contrast was noted at each level. Radiographs were obtained for documentation purposes.   After attaining the desired flow of contrast documented above, a 0.5 to 1.0 ml test dose of 0.25% Marcaine was injected into each respective transforaminal space.  The patient was observed for 90 seconds post injection.  After no sensory deficits were reported, and normal lower extremity motor function was noted,   the above injectate was administered so that equal amounts of the injectate were placed at each foramen (level) into the transforaminal epidural space.   Additional Comments:  The patient tolerated the procedure well Dressing: Band-Aid    Post-procedure details: Patient was observed during the procedure. Post-procedure instructions were reviewed.  Patient left the clinic in stable condition.   Pertinent Imaging: MRI LUMBAR SPINE WITHOUT CONTRAST 06/25/2017   TECHNIQUE: Multiplanar, multisequence MR imaging of the lumbar spine was performed. No intravenous contrast was administered.  COMPARISON: Lumbar spine radiograph 05/29/2017  Lumbar spine CT 10/09/2016  FINDINGS: Segmentation: Standard.  Alignment: Grade 1 anterolisthesis at L4-L5.  Vertebrae: Compression deformity of the L1 vertebral body with approximately 50% central height loss. No retropulsion. No edema. No other focal osseous abnormality.  Conus medullaris: Extends to the L2 level and appears normal.  Paraspinal and other soft tissues: Negative.  Disc levels:  T11-T12: Small disc bulge with mild right neural foraminal stenosis.  T12-L1: Disc desiccation and mild bulge with mild bilateral foraminal stenosis.  L1-L2: Disc desiccation with minimal bulge. No  stenosis.  L2-L3: Disc desiccation and moderate facet hypertrophy. No spinal canal stenosis or neural foraminal stenosis.  L3-L4: Disc desiccation with small bulge and mild bilateral facet hypertrophy. No stenosis.  L4-L5: Grade 1 anterolisthesis with small disc bulge and severe bilateral facet hypertrophy. No spinal canal stenosis. Mild left neural foraminal stenosis.  L5-S1: Narrowing of the disc space with severe bilateral facet hypertrophy. No spinal canal or neural foraminal stenosis.  Visualized sacrum: Normal.  IMPRESSION: 1. Grade 1 L4-L5 anterolisthesis secondary to severe bilateral facet arthrosis. Mild left foraminal stenosis. 2. Severe L5-S1 facet arthrosis without associated stenosis. This may be a source of local low back pain. 3. Chronic compression fracture of L1 with approximately 50% central height loss and no significant retropulsion. 4. Otherwise generalized mild lumbar degenerative disc disease without spinal canal stenosis or nerve root impingement.

## 2017-12-26 ENCOUNTER — Ambulatory Visit (INDEPENDENT_AMBULATORY_CARE_PROVIDER_SITE_OTHER): Payer: Medicare Other | Admitting: Specialist

## 2017-12-26 ENCOUNTER — Encounter (INDEPENDENT_AMBULATORY_CARE_PROVIDER_SITE_OTHER): Payer: Self-pay | Admitting: Specialist

## 2017-12-26 VITALS — BP 143/77 | HR 63 | Ht 60.0 in | Wt 157.0 lb

## 2017-12-26 DIAGNOSIS — M48062 Spinal stenosis, lumbar region with neurogenic claudication: Secondary | ICD-10-CM | POA: Diagnosis not present

## 2017-12-26 DIAGNOSIS — M4316 Spondylolisthesis, lumbar region: Secondary | ICD-10-CM

## 2017-12-26 NOTE — Patient Instructions (Signed)
Avoid bending, stooping and avoid lifting weights greater than 10 lbs. Avoid prolong standing and walking. Avoid frequent bending and stooping  No lifting greater than 10 lbs. May use ice or moist heat for pain. Weight loss is of benefit. Handicap license is approved. Dr. Newton's secretary/Assistant will call to arrange for epidural steroid injection  

## 2017-12-26 NOTE — Progress Notes (Signed)
Office Visit Note   Patient: [name] [name]           Date of Birth: March 08, 1930           MRN: [id number] Visit Date: 12/26/2017              Requested by: Garth Bigness, MD [address] [place], [place] [address] PCP: Garth Bigness, MD   Assessment & Plan: Visit Diagnoses:  1. Spondylolisthesis, lumbar region   2. Spinal stenosis of lumbar region with neurogenic claudication     Plan:Avoid bending, stooping and avoid lifting weights greater than 10 lbs. Avoid prolong standing and walking. Avoid frequent bending and stooping  No lifting greater than 10 lbs. May use ice or moist heat for pain. Weight loss is of benefit. Handicap license is approved. Dr. [place] Blas secretary/Assistant will call to arrange for epidural steroid injection  Follow-Up Instructions: No Follow-up on file.   Orders:  No orders of the defined types were placed in this encounter.  No orders of the defined types were placed in this encounter.     Procedures: No procedures performed   Clinical Data: No additional findings.   Subjective: Chief Complaint  Patient presents with  . Lower Back - Follow-up    HPI  Review of Systems  Constitutional: Negative.   HENT: Negative.   Eyes: Negative.   Respiratory: Negative.   Cardiovascular: Negative.   Gastrointestinal: Negative.   Endocrine: Negative.   Genitourinary: Negative.   Musculoskeletal: Negative.   Skin: Negative.   Allergic/Immunologic: Negative.   Neurological: Negative.   Hematological: Negative.   Psychiatric/Behavioral: Negative.      Objective: Vital Signs: BP (!) 143/77 (BP Location: Left Arm, Patient Position: Sitting)   Pulse 63   Ht 5' (1.524 m)   Wt 157 lb (71.2 kg)   BMI 30.66 kg/m   Physical Exam  Constitutional: She is oriented to person, place, and time. She appears well-developed and well-nourished.  HENT:  Head: Normocephalic and atraumatic.  Eyes: EOM are normal. Pupils are equal,  round, and reactive to light.  Neck: Normal range of motion. Neck supple.  Pulmonary/Chest: Effort normal and breath sounds normal.  Abdominal: Soft. Bowel sounds are normal.  Neurological: She is alert and oriented to person, place, and time.  Skin: Skin is warm and dry.  Psychiatric: She has a normal mood and affect. Her behavior is normal. Judgment and thought content normal.    Back Exam   Tenderness  The patient is experiencing tenderness in the lumbar.  Range of Motion  Extension: abnormal  Flexion: normal  Lateral bend right: normal  Rotation right: normal   Muscle Strength  Right Quadriceps:  5/5  Left Quadriceps:  5/5  Right Hamstrings:  5/5  Left Hamstrings:  5/5   Tests  Straight leg raise right: negative Straight leg raise left: negative  Reflexes  Patellar: normal Achilles: normal Babinski's sign: normal   Other  Toe walk: normal Heel walk: normal Sensation: normal Erythema: no back redness Scars: present      Specialty Comments:  No specialty comments available.  Imaging: No results found.   PMFS History: Patient Active Problem List   Diagnosis Date Noted  . Umbilical pain 10/31/2017  . Lumbar pain 10/10/2016  . Pelvic pain 10/10/2016  . Seizures (HCC) 08/06/2014  . Abnormal involuntary movement 08/05/2014  . Hemiplegia, unspecified, affecting dominant side 08/05/2014  . Right sided weakness 08/05/2014  . CVA (cerebral vascular accident) (HCC) 07/21/2014  .  Left leg pain 07/20/2014  . HTN (hypertension) 07/18/2014  . HLD (hyperlipidemia) 07/18/2014  . Thrombocytopenia (HCC) 07/18/2014  . Essential tremor 10/27/2013  . Obesity, unspecified 10/27/2013  . DM2 (diabetes mellitus, type 2) (HCC) 10/27/2013  . History of peripheral neuropathy 10/27/2013   Past Medical History:  Diagnosis Date  . Anxiety   . Diabetes (HCC) 10/27/2013  . DVT (deep venous thrombosis) (HCC)   . Essential tremor 10/27/2013  . High cholesterol 1999  .  Hypertension   . Ischemic stroke (HCC) 07/18/2014  . Obesity, unspecified 10/27/2013  . Pneumonia   . Unspecified hereditary and idiopathic peripheral neuropathy 10/27/2013    Family History  Problem Relation Age of Onset  . Stroke Mother   . Heart Problems Brother   . Coronary artery disease Other   . Heart Problems Sister     Past Surgical History:  Procedure Laterality Date  . CATARACT EXTRACTION Bilateral   . FOOT SURGERY     Social History   Occupational History    Comment: does not work  Tobacco Use  . Smoking status: Never Smoker  . Smokeless tobacco: Never Used  Substance and Sexual Activity  . Alcohol use: No    Alcohol/week: 0.0 oz  . Drug use: No  . Sexual activity: No

## 2018-01-08 ENCOUNTER — Ambulatory Visit: Payer: Medicare Other | Admitting: Neurology

## 2018-01-22 ENCOUNTER — Ambulatory Visit (INDEPENDENT_AMBULATORY_CARE_PROVIDER_SITE_OTHER): Payer: Medicare Other | Admitting: Physical Medicine and Rehabilitation

## 2018-01-22 ENCOUNTER — Encounter (INDEPENDENT_AMBULATORY_CARE_PROVIDER_SITE_OTHER): Payer: Self-pay | Admitting: Physical Medicine and Rehabilitation

## 2018-01-22 ENCOUNTER — Ambulatory Visit (INDEPENDENT_AMBULATORY_CARE_PROVIDER_SITE_OTHER): Payer: Self-pay

## 2018-01-22 VITALS — BP 134/61 | HR 78 | Temp 97.4°F

## 2018-01-22 DIAGNOSIS — M5416 Radiculopathy, lumbar region: Secondary | ICD-10-CM

## 2018-01-22 MED ORDER — BETAMETHASONE SOD PHOS & ACET 6 (3-3) MG/ML IJ SUSP
12.0000 mg | Freq: Once | INTRAMUSCULAR | Status: AC
  Administered 2018-01-22: 12 mg

## 2018-01-22 NOTE — Patient Instructions (Signed)

## 2018-01-22 NOTE — Progress Notes (Deleted)
Pt states pain in lower back. Pt states last injection helped out a lot and that the pain returned after 3 weeks. Pt states walking and standing makes it worse, sitting makes pain better. +Driver

## 2018-01-27 NOTE — Procedures (Signed)
Lumbosacral Transforaminal Epidural Steroid Injection - Sub-Pedicular Approach with Fluoroscopic Guidance  Patient: [name] [name]      Date of Birth: [birth year] MRN: [id number] PCP: Garth Bignessimberlake, Kathryn, MD      Visit Date: 01/22/2018   Universal Protocol:    Date/Time: 01/22/2018  Consent Given By: the patient  Position: PRONE  Additional Comments: Vital signs were monitored before and after the procedure. Patient was prepped and draped in the usual sterile fashion. The correct patient, procedure, and site was verified.   Injection Procedure Details:  Procedure Site One Meds Administered:  Meds ordered this encounter  Medications  . betamethasone acetate-betamethasone sodium phosphate (CELESTONE) injection 12 mg    Laterality: Left  Location/Site:  L4-L5  Needle size: 22 G  Needle type: Spinal  Needle Placement: Transforaminal  Findings:    -Comments: Excellent flow of contrast along the nerve and into the epidural space.  Procedure Details: After squaring off the end-plates to get a true AP view, the C-arm was positioned so that an oblique view of the foramen as noted above was visualized. The target area is just inferior to the "nose of the scotty dog" or sub pedicular. The soft tissues overlying this structure were infiltrated with 2-3 ml. of 1% Lidocaine without Epinephrine.  The spinal needle was inserted toward the target using a "trajectory" view along the fluoroscope beam.  Under AP and lateral visualization, the needle was advanced so it did not puncture dura and was located close the 6 O'Clock position of the pedical in AP tracterory. Biplanar projections were used to confirm position. Aspiration was confirmed to be negative for CSF and/or blood. A 1-2 ml. volume of Isovue-250 was injected and flow of contrast was noted at each level. Radiographs were obtained for documentation purposes.   After attaining the desired flow of contrast documented above, a  0.5 to 1.0 ml test dose of 0.25% Marcaine was injected into each respective transforaminal space.  The patient was observed for 90 seconds post injection.  After no sensory deficits were reported, and normal lower extremity motor function was noted,   the above injectate was administered so that equal amounts of the injectate were placed at each foramen (level) into the transforaminal epidural space.   Additional Comments:  The patient tolerated the procedure well Dressing: Band-Aid    Post-procedure details: Patient was observed during the procedure. Post-procedure instructions were reviewed.  Patient left the clinic in stable condition.

## 2018-01-27 NOTE — Progress Notes (Signed)
[name] [name] - 84 y.o. female MRN [id number]  Date of birth: 12-03-1929  Office Visit Note: Visit Date: 01/22/2018 PCP: Garth Bigness, MD Referred by: Garth Bigness, MD  Subjective: Chief Complaint  Patient presents with  . Lower Back - Pain   HPI: [name] [name] is an 84 year old female with lumbar stenosis and who received prior left L4 transforaminal epidural steroid injection with almost complete relief of her pain for 3 weeks.  She has had slow return of symptoms.  Dr. Otelia Sergeant request repeat injection with possibility of repeating this 1 more time in a few weeks.  The injection  will be diagnostic and hopefully therapeutic. The patient has failed conservative care including time, medications and activity modification.    ROS Otherwise per HPI.  Assessment & Plan: Visit Diagnoses:  1. Lumbar radiculopathy     Plan: Findings:  Left L4 transforaminal epidural steroid injection.    Meds & Orders:  Meds ordered this encounter  Medications  . betamethasone acetate-betamethasone sodium phosphate (CELESTONE) injection 12 mg    Orders Placed This Encounter  Procedures  . XR C-ARM NO REPORT  . Epidural Steroid injection    Follow-up: Return if symptoms worsen or fail to improve.   Procedures: No procedures performed  Lumbosacral Transforaminal Epidural Steroid Injection - Sub-Pedicular Approach with Fluoroscopic Guidance  Patient: [name] [name]      Date of Birth: [birth year] MRN: [id number] PCP: Garth Bigness, MD      Visit Date: 01/22/2018   Universal Protocol:    Date/Time: 01/22/2018  Consent Given By: the patient  Position: PRONE  Additional Comments: Vital signs were monitored before and after the procedure. Patient was prepped and draped in the usual sterile fashion. The correct patient, procedure, and site was verified.   Injection Procedure Details:  Procedure Site One Meds Administered:  Meds ordered this encounter  Medications   . betamethasone acetate-betamethasone sodium phosphate (CELESTONE) injection 12 mg    Laterality: Left  Location/Site:  L4-L5  Needle size: 22 G  Needle type: Spinal  Needle Placement: Transforaminal  Findings:    -Comments: Excellent flow of contrast along the nerve and into the epidural space.  Procedure Details: After squaring off the end-plates to get a true AP view, the C-arm was positioned so that an oblique view of the foramen as noted above was visualized. The target area is just inferior to the "nose of the scotty dog" or sub pedicular. The soft tissues overlying this structure were infiltrated with 2-3 ml. of 1% Lidocaine without Epinephrine.  The spinal needle was inserted toward the target using a "trajectory" view along the fluoroscope beam.  Under AP and lateral visualization, the needle was advanced so it did not puncture dura and was located close the 6 O'Clock position of the pedical in AP tracterory. Biplanar projections were used to confirm position. Aspiration was confirmed to be negative for CSF and/or blood. A 1-2 ml. volume of Isovue-250 was injected and flow of contrast was noted at each level. Radiographs were obtained for documentation purposes.   After attaining the desired flow of contrast documented above, a 0.5 to 1.0 ml test dose of 0.25% Marcaine was injected into each respective transforaminal space.  The patient was observed for 90 seconds post injection.  After no sensory deficits were reported, and normal lower extremity motor function was noted,   the above injectate was administered so that equal amounts of the injectate were placed at each foramen (level) into the transforaminal epidural  space.   Additional Comments:  The patient tolerated the procedure well Dressing: Band-Aid    Post-procedure details: Patient was observed during the procedure. Post-procedure instructions were reviewed.  Patient left the clinic in stable condition.     Clinical History: MRI LUMBAR SPINE WITHOUT CONTRAST 06/25/2017   TECHNIQUE: Multiplanar, multisequence MR imaging of the lumbar spine was performed. No intravenous contrast was administered.  COMPARISON: Lumbar spine radiograph 05/29/2017  Lumbar spine CT 10/09/2016  FINDINGS: Segmentation: Standard.  Alignment: Grade 1 anterolisthesis at L4-L5.  Vertebrae: Compression deformity of the L1 vertebral body with approximately 50% central height loss. No retropulsion. No edema. No other focal osseous abnormality.  Conus medullaris: Extends to the L2 level and appears normal.  Paraspinal and other soft tissues: Negative.  Disc levels:  T11-T12: Small disc bulge with mild right neural foraminal stenosis.  T12-L1: Disc desiccation and mild bulge with mild bilateral foraminal stenosis.  L1-L2: Disc desiccation with minimal bulge. No stenosis.  L2-L3: Disc desiccation and moderate facet hypertrophy. No spinal canal stenosis or neural foraminal stenosis.  L3-L4: Disc desiccation with small bulge and mild bilateral facet hypertrophy. No stenosis.  L4-L5: Grade 1 anterolisthesis with small disc bulge and severe bilateral facet hypertrophy. No spinal canal stenosis. Mild left neural foraminal stenosis.  L5-S1: Narrowing of the disc space with severe bilateral facet hypertrophy. No spinal canal or neural foraminal stenosis.  Visualized sacrum: Normal.  IMPRESSION: 1. Grade 1 L4-L5 anterolisthesis secondary to severe bilateral facet arthrosis. Mild left foraminal stenosis. 2. Severe L5-S1 facet arthrosis without associated stenosis. This may be a source of local low back pain. 3. Chronic compression fracture of L1 with approximately 50% central height loss and no significant retropulsion. 4. Otherwise generalized mild lumbar degenerative disc disease without spinal canal stenosis or nerve root impingement.  She reports that  has never smoked. she has  never used smokeless tobacco. No results for input(s): HGBA1C, LABURIC in the last 8760 hours.  Objective:  VS:  HT:    WT:   BMI:     BP:134/61  HR:78bpm  TEMP:(!) 97.4 F (36.3 C)( )  RESP:95 % Physical Exam  Ortho Exam Imaging: No results found.  Past Medical/Family/Surgical/Social History: Medications & Allergies reviewed per EMR Patient Active Problem List   Diagnosis Date Noted  . Umbilical pain 10/31/2017  . Lumbar pain 10/10/2016  . Pelvic pain 10/10/2016  . Seizures (HCC) 08/06/2014  . Abnormal involuntary movement 08/05/2014  . Hemiplegia, unspecified, affecting dominant side 08/05/2014  . Right sided weakness 08/05/2014  . CVA (cerebral vascular accident) (HCC) 07/21/2014  . Left leg pain 07/20/2014  . HTN (hypertension) 07/18/2014  . HLD (hyperlipidemia) 07/18/2014  . Thrombocytopenia (HCC) 07/18/2014  . Essential tremor 10/27/2013  . Obesity, unspecified 10/27/2013  . DM2 (diabetes mellitus, type 2) (HCC) 10/27/2013  . History of peripheral neuropathy 10/27/2013   Past Medical History:  Diagnosis Date  . Anxiety   . Diabetes (HCC) 10/27/2013  . DVT (deep venous thrombosis) (HCC)   . Essential tremor 10/27/2013  . High cholesterol 1999  . Hypertension   . Ischemic stroke (HCC) 07/18/2014  . Obesity, unspecified 10/27/2013  . Pneumonia   . Unspecified hereditary and idiopathic peripheral neuropathy 10/27/2013   Family History  Problem Relation Age of Onset  . Stroke Mother   . Heart Problems Brother   . Coronary artery disease Other   . Heart Problems Sister    Past Surgical History:  Procedure Laterality Date  . CATARACT EXTRACTION Bilateral   .  FOOT SURGERY     Social History   Occupational History    Comment: does not work  Tobacco Use  . Smoking status: Never Smoker  . Smokeless tobacco: Never Used  Substance and Sexual Activity  . Alcohol use: No    Alcohol/week: 0.0 oz  . Drug use: No  . Sexual activity: No

## 2018-01-29 ENCOUNTER — Ambulatory Visit: Payer: Medicare Other | Admitting: Neurology

## 2018-02-23 ENCOUNTER — Telehealth (INDEPENDENT_AMBULATORY_CARE_PROVIDER_SITE_OTHER): Payer: Self-pay | Admitting: Specialist

## 2018-02-23 ENCOUNTER — Ambulatory Visit (INDEPENDENT_AMBULATORY_CARE_PROVIDER_SITE_OTHER): Payer: Medicare Other | Admitting: Physical Medicine and Rehabilitation

## 2018-02-23 ENCOUNTER — Encounter (INDEPENDENT_AMBULATORY_CARE_PROVIDER_SITE_OTHER): Payer: Self-pay | Admitting: Physical Medicine and Rehabilitation

## 2018-02-23 ENCOUNTER — Ambulatory Visit (INDEPENDENT_AMBULATORY_CARE_PROVIDER_SITE_OTHER): Payer: Self-pay

## 2018-02-23 VITALS — BP 140/74 | HR 64 | Temp 97.6°F

## 2018-02-23 DIAGNOSIS — M5416 Radiculopathy, lumbar region: Secondary | ICD-10-CM | POA: Diagnosis not present

## 2018-02-23 MED ORDER — BETAMETHASONE SOD PHOS & ACET 6 (3-3) MG/ML IJ SUSP
12.0000 mg | Freq: Once | INTRAMUSCULAR | Status: AC
  Administered 2018-02-23: 12 mg

## 2018-02-23 NOTE — Patient Instructions (Signed)

## 2018-02-23 NOTE — Progress Notes (Signed)
Numeric Pain Rating Scale and Functional Assessment Average Pain 4   In the last MONTH (on 0-10 scale) has pain interfered with the following?  1. General activity like being  able to carry out your everyday physical activities such as walking, climbing stairs, carrying groceries, or moving a chair?  Rating(5)    +Driver, -BT, -Dye Allergies.  

## 2018-02-24 NOTE — Telephone Encounter (Signed)
Can you please call and r/s patient? -- See Message below

## 2018-02-27 NOTE — Progress Notes (Signed)
[name] HoleFatemeh B [name] - [age] y.o. female MRN [id number]  Date of birth: 1930/04/29  Office Visit Note: Visit Date: 02/23/2018 PCP: Garth Bignessimberlake, Kathryn, MD Referred by: Garth Bignessimberlake, Kathryn, MD  Subjective: Chief Complaint  Patient presents with  . Lower Back - Pain   HPI: [name] [name] is a pleasant 84 year old female that comes in today for planned left L4 transforaminal epidural steroid injection.  She has a big trip planned where she may have the country for a while.  She has known stenosis.  2 prior injections have been beneficial each time with additive effects on each successive injection.  Her case is complicated by prior stroke and hemiplegia and right-sided weakness.  She also has diabetes and hypertension.  She will follow-up when she returns out of the country with Dr. Otelia SergeantNitka.   ROS Otherwise per HPI.  Assessment & Plan: Visit Diagnoses:  1. Lumbar radiculopathy     Plan: No additional findings.   Meds & Orders:  Meds ordered this encounter  Medications  . betamethasone acetate-betamethasone sodium phosphate (CELESTONE) injection 12 mg    Orders Placed This Encounter  Procedures  . XR C-ARM NO REPORT  . Epidural Steroid injection    Follow-up: No follow-ups on file.   Procedures: No procedures performed  Lumbosacral Transforaminal Epidural Steroid Injection - Sub-Pedicular Approach with Fluoroscopic Guidance  Patient: [name] HoleFatemeh B [name]      Date of Birth: 1930/04/29 MRN: [id number] PCP: Garth Bignessimberlake, Kathryn, MD      Visit Date: 02/23/2018   Universal Protocol:    Date/Time: 02/23/2018  Consent Given By: the patient  Position: PRONE  Additional Comments: Vital signs were monitored before and after the procedure. Patient was prepped and draped in the usual sterile fashion. The correct patient, procedure, and site was verified.   Injection Procedure Details:  Procedure Site One Meds Administered:  Meds ordered this encounter  Medications  . betamethasone  acetate-betamethasone sodium phosphate (CELESTONE) injection 12 mg    Laterality: Left  Location/Site:  L4-L5  Needle size: 22 G  Needle type: Spinal  Needle Placement: Transforaminal  Findings:    -Comments: Excellent flow of contrast along the nerve and into the epidural space.  Procedure Details: After squaring off the end-plates to get a true AP view, the C-arm was positioned so that an oblique view of the foramen as noted above was visualized. The target area is just inferior to the "nose of the scotty dog" or sub pedicular. The soft tissues overlying this structure were infiltrated with 2-3 ml. of 1% Lidocaine without Epinephrine.  The spinal needle was inserted toward the target using a "trajectory" view along the fluoroscope beam.  Under AP and lateral visualization, the needle was advanced so it did not puncture dura and was located close the 6 O'Clock position of the pedical in AP tracterory. Biplanar projections were used to confirm position. Aspiration was confirmed to be negative for CSF and/or blood. A 1-2 ml. volume of Isovue-250 was injected and flow of contrast was noted at each level. Radiographs were obtained for documentation purposes.   After attaining the desired flow of contrast documented above, a 0.5 to 1.0 ml test dose of 0.25% Marcaine was injected into each respective transforaminal space.  The patient was observed for 90 seconds post injection.  After no sensory deficits were reported, and normal lower extremity motor function was noted,   the above injectate was administered so that equal amounts of the injectate were placed at each foramen (level) into the  transforaminal epidural space.   Additional Comments:  The patient tolerated the procedure well Dressing: Band-Aid    Post-procedure details: Patient was observed during the procedure. Post-procedure instructions were reviewed.  Patient left the clinic in stable condition.    Clinical  History: MRI LUMBAR SPINE WITHOUT CONTRAST 06/25/2017   TECHNIQUE: Multiplanar, multisequence MR imaging of the lumbar spine was performed. No intravenous contrast was administered.  COMPARISON: Lumbar spine radiograph 05/29/2017  Lumbar spine CT 10/09/2016  FINDINGS: Segmentation: Standard.  Alignment: Grade 1 anterolisthesis at L4-L5.  Vertebrae: Compression deformity of the L1 vertebral body with approximately 50% central height loss. No retropulsion. No edema. No other focal osseous abnormality.  Conus medullaris: Extends to the L2 level and appears normal.  Paraspinal and other soft tissues: Negative.  Disc levels:  T11-T12: Small disc bulge with mild right neural foraminal stenosis.  T12-L1: Disc desiccation and mild bulge with mild bilateral foraminal stenosis.  L1-L2: Disc desiccation with minimal bulge. No stenosis.  L2-L3: Disc desiccation and moderate facet hypertrophy. No spinal canal stenosis or neural foraminal stenosis.  L3-L4: Disc desiccation with small bulge and mild bilateral facet hypertrophy. No stenosis.  L4-L5: Grade 1 anterolisthesis with small disc bulge and severe bilateral facet hypertrophy. No spinal canal stenosis. Mild left neural foraminal stenosis.  L5-S1: Narrowing of the disc space with severe bilateral facet hypertrophy. No spinal canal or neural foraminal stenosis.  Visualized sacrum: Normal.  IMPRESSION: 1. Grade 1 L4-L5 anterolisthesis secondary to severe bilateral facet arthrosis. Mild left foraminal stenosis. 2. Severe L5-S1 facet arthrosis without associated stenosis. This may be a source of local low back pain. 3. Chronic compression fracture of L1 with approximately 50% central height loss and no significant retropulsion. 4. Otherwise generalized mild lumbar degenerative disc disease without spinal canal stenosis or nerve root impingement.   She reports that she has never smoked. She has never used  smokeless tobacco. No results for input(s): HGBA1C, LABURIC in the last 8760 hours.  Objective:  VS:  HT:    WT:   BMI:     BP:140/74  HR:64bpm  TEMP:97.6 F (36.4 C)( )  RESP:97 % Physical Exam  Ortho Exam Imaging: No results found.  Past Medical/Family/Surgical/Social History: Medications & Allergies reviewed per EMR, new medications updated. Patient Active Problem List   Diagnosis Date Noted  . Umbilical pain 10/31/2017  . Lumbar pain 10/10/2016  . Pelvic pain 10/10/2016  . Seizures (HCC) 08/06/2014  . Abnormal involuntary movement 08/05/2014  . Hemiplegia, unspecified, affecting dominant side 08/05/2014  . Right sided weakness 08/05/2014  . CVA (cerebral vascular accident) (HCC) 07/21/2014  . Left leg pain 07/20/2014  . HTN (hypertension) 07/18/2014  . HLD (hyperlipidemia) 07/18/2014  . Thrombocytopenia (HCC) 07/18/2014  . Essential tremor 10/27/2013  . Obesity, unspecified 10/27/2013  . DM2 (diabetes mellitus, type 2) (HCC) 10/27/2013  . History of peripheral neuropathy 10/27/2013   Past Medical History:  Diagnosis Date  . Anxiety   . Diabetes (HCC) 10/27/2013  . DVT (deep venous thrombosis) (HCC)   . Essential tremor 10/27/2013  . High cholesterol 1999  . Hypertension   . Ischemic stroke (HCC) 07/18/2014  . Obesity, unspecified 10/27/2013  . Pneumonia   . Unspecified hereditary and idiopathic peripheral neuropathy 10/27/2013   Family History  Problem Relation Age of Onset  . Stroke Mother   . Heart Problems Brother   . Coronary artery disease Other   . Heart Problems Sister    Past Surgical History:  Procedure Laterality Date  .  CATARACT EXTRACTION Bilateral   . FOOT SURGERY     Social History   Occupational History    Comment: does not work  Tobacco Use  . Smoking status: Never Smoker  . Smokeless tobacco: Never Used  Substance and Sexual Activity  . Alcohol use: No    Alcohol/week: 0.0 oz  . Drug use: No  . Sexual activity: Never

## 2018-02-27 NOTE — Procedures (Signed)
Lumbosacral Transforaminal Epidural Steroid Injection - Sub-Pedicular Approach with Fluoroscopic Guidance  Patient: [name] [name]      Date of Birth: 02-09-1930 MRN: [id number] PCP: Garth Bignessimberlake, Kathryn, MD      Visit Date: 02/23/2018   Universal Protocol:    Date/Time: 02/23/2018  Consent Given By: the patient  Position: PRONE  Additional Comments: Vital signs were monitored before and after the procedure. Patient was prepped and draped in the usual sterile fashion. The correct patient, procedure, and site was verified.   Injection Procedure Details:  Procedure Site One Meds Administered:  Meds ordered this encounter  Medications  . betamethasone acetate-betamethasone sodium phosphate (CELESTONE) injection 12 mg    Laterality: Left  Location/Site:  L4-L5  Needle size: 22 G  Needle type: Spinal  Needle Placement: Transforaminal  Findings:    -Comments: Excellent flow of contrast along the nerve and into the epidural space.  Procedure Details: After squaring off the end-plates to get a true AP view, the C-arm was positioned so that an oblique view of the foramen as noted above was visualized. The target area is just inferior to the "nose of the scotty dog" or sub pedicular. The soft tissues overlying this structure were infiltrated with 2-3 ml. of 1% Lidocaine without Epinephrine.  The spinal needle was inserted toward the target using a "trajectory" view along the fluoroscope beam.  Under AP and lateral visualization, the needle was advanced so it did not puncture dura and was located close the 6 O'Clock position of the pedical in AP tracterory. Biplanar projections were used to confirm position. Aspiration was confirmed to be negative for CSF and/or blood. A 1-2 ml. volume of Isovue-250 was injected and flow of contrast was noted at each level. Radiographs were obtained for documentation purposes.   After attaining the desired flow of contrast documented above, a  0.5 to 1.0 ml test dose of 0.25% Marcaine was injected into each respective transforaminal space.  The patient was observed for 90 seconds post injection.  After no sensory deficits were reported, and normal lower extremity motor function was noted,   the above injectate was administered so that equal amounts of the injectate were placed at each foramen (level) into the transforaminal epidural space.   Additional Comments:  The patient tolerated the procedure well Dressing: Band-Aid    Post-procedure details: Patient was observed during the procedure. Post-procedure instructions were reviewed.  Patient left the clinic in stable condition.

## 2018-03-03 ENCOUNTER — Other Ambulatory Visit: Payer: Self-pay | Admitting: Neurology

## 2018-03-03 DIAGNOSIS — G40109 Localization-related (focal) (partial) symptomatic epilepsy and epileptic syndromes with simple partial seizures, not intractable, without status epilepticus: Secondary | ICD-10-CM

## 2018-03-20 ENCOUNTER — Ambulatory Visit (INDEPENDENT_AMBULATORY_CARE_PROVIDER_SITE_OTHER): Payer: Medicare Other | Admitting: Specialist

## 2018-04-09 ENCOUNTER — Telehealth (HOSPITAL_COMMUNITY): Payer: Self-pay | Admitting: Family Medicine

## 2018-04-09 NOTE — Telephone Encounter (Signed)
lmovm requesting that the son give [place] a call back.   Please schedule appt if he calls back. Fleeger, Maryjo Rochester, CMA

## 2018-04-09 NOTE — Telephone Encounter (Signed)
White team, could we call (son speaks [place]) and make an appt for a physical for this patient? She is behind on several health maintenance topics.

## 2018-04-15 ENCOUNTER — Other Ambulatory Visit: Payer: Self-pay | Admitting: Neurology

## 2018-04-15 DIAGNOSIS — E0842 Diabetes mellitus due to underlying condition with diabetic polyneuropathy: Secondary | ICD-10-CM

## 2018-04-21 ENCOUNTER — Other Ambulatory Visit: Payer: Self-pay | Admitting: Neurology

## 2018-04-21 ENCOUNTER — Ambulatory Visit: Payer: Medicare Other | Admitting: Neurology

## 2018-04-21 DIAGNOSIS — G250 Essential tremor: Secondary | ICD-10-CM

## 2018-05-10 ENCOUNTER — Other Ambulatory Visit: Payer: Self-pay | Admitting: Neurology

## 2018-05-10 DIAGNOSIS — E785 Hyperlipidemia, unspecified: Secondary | ICD-10-CM

## 2018-05-10 DIAGNOSIS — R531 Weakness: Secondary | ICD-10-CM

## 2018-05-11 ENCOUNTER — Encounter (INDEPENDENT_AMBULATORY_CARE_PROVIDER_SITE_OTHER): Payer: Self-pay | Admitting: Specialist

## 2018-05-11 ENCOUNTER — Ambulatory Visit (INDEPENDENT_AMBULATORY_CARE_PROVIDER_SITE_OTHER): Payer: Medicare Other | Admitting: Specialist

## 2018-05-11 ENCOUNTER — Ambulatory Visit (INDEPENDENT_AMBULATORY_CARE_PROVIDER_SITE_OTHER): Payer: Medicare Other

## 2018-05-11 VITALS — BP 131/71 | HR 67 | Ht 60.0 in | Wt 157.0 lb

## 2018-05-11 DIAGNOSIS — M5442 Lumbago with sciatica, left side: Secondary | ICD-10-CM

## 2018-05-11 DIAGNOSIS — M48062 Spinal stenosis, lumbar region with neurogenic claudication: Secondary | ICD-10-CM

## 2018-05-11 DIAGNOSIS — M810 Age-related osteoporosis without current pathological fracture: Secondary | ICD-10-CM

## 2018-05-11 DIAGNOSIS — G8929 Other chronic pain: Secondary | ICD-10-CM

## 2018-05-11 MED ORDER — DULOXETINE HCL 20 MG PO CPEP: 20.0000 mg | ORAL_CAPSULE | Freq: Every day | ORAL | 1 refills | Status: DC

## 2018-05-11 NOTE — Progress Notes (Signed)
Office Visit Note   Patient: [name] [name]           Date of Birth: 1930/01/15           MRN: [id number] Visit Date: 05/11/2018              Requested by: Garth Bignessimberlake, Kathryn, MD [address] [place], [place] [id number] PCP: Garth Bignessimberlake, Kathryn, MD   Assessment & Plan: Visit Diagnoses:  1. Chronic left-sided low back pain with left-sided sciatica   2. Age-related osteoporosis without current pathological fracture   3. Spinal stenosis of lumbar region with neurogenic claudication     Plan:  Avoid bending, stooping and avoid lifting weights greater than 10 lbs. Avoid prolong standing and walking. Avoid frequent bending and stooping  No lifting greater than 10 lbs. May use ice or moist heat for pain. Weight loss is of benefit. Handicap license is approved. Bone density test is ordered. Medication for pain associated with spondylolisthesis Follow-Up Instructions: Return in about 1 month (around 06/08/2018).   Orders:  Orders Placed This Encounter  Procedures  . XR Lumbar Spine 2-3 Views  . DG BONE DENSITY (DXA)   Meds ordered this encounter  Medications  . DULoxetine (CYMBALTA) 20 MG capsule    Sig: Take 1 capsule (20 mg total) by mouth daily.    Dispense:  30 capsule    Refill:  1      Procedures: No procedures performed   Clinical Data: No additional findings.   Subjective: Chief Complaint  Patient presents with  . Lower Back - Follow-up    84 year old female with history of lumbar spinal stenosis and has undergone ESI s by Dr. Alvester MorinNewton x 3  In 11/2017, 12/2017 and 02/2018. The pain has returned with the ESIs only helping for 2 weeks. No bowel or bladder difficulty. Takes nothing for back pain does take gabapentin for neuralgias. She went to PT in the past, once or twice a week But she is not in physical therapy now.    Review of Systems  Constitutional: Negative.   HENT: Negative.   Eyes: Negative.   Respiratory: Negative.   Cardiovascular: Negative.    Gastrointestinal: Negative.   Endocrine: Negative.   Genitourinary: Negative.   Musculoskeletal: Negative.   Skin: Negative.   Allergic/Immunologic: Negative.   Neurological: Negative.   Hematological: Negative.   Psychiatric/Behavioral: Negative.      Objective: Vital Signs: BP 131/71   Pulse 67   Ht 5' (1.524 m)   Wt 157 lb (71.2 kg)   BMI 30.66 kg/m   Physical Exam  Constitutional: She is oriented to person, place, and time. She appears well-developed and well-nourished.  HENT:  Head: Normocephalic and atraumatic.  Eyes: Pupils are equal, round, and reactive to light. EOM are normal.  Neck: Normal range of motion. Neck supple.  Pulmonary/Chest: Effort normal and breath sounds normal.  Abdominal: Soft. Bowel sounds are normal.  Neurological: She is alert and oriented to person, place, and time.  Skin: Skin is warm and dry.  Psychiatric: She has a normal mood and affect. Her behavior is normal. Judgment and thought content normal.    Back Exam   Tenderness  The patient is experiencing tenderness in the lumbar.  Range of Motion  Extension: abnormal  Flexion: abnormal  Lateral bend right: normal  Lateral bend left: normal  Rotation right: normal  Rotation left: normal   Muscle Strength  Right Quadriceps:  5/5  Left Quadriceps:  5/5  Right Hamstrings:  5/5   Tests  Straight leg raise right: negative  Reflexes  Patellar: abnormal Achilles: abnormal Babinski's sign: normal   Other  Toe walk: normal Heel walk: normal Sensation: normal Gait: normal       Specialty Comments:  No specialty comments available.  Imaging: Xr Lumbar Spine 2-3 Views  Result Date: 05/11/2018 L1 compression fracture 50%, multiple level DDD, grade one spondylolysthesis L4-5.    PMFS History: Patient Active Problem List   Diagnosis Date Noted  . Umbilical pain 10/31/2017  . Lumbar pain 10/10/2016  . Pelvic pain 10/10/2016  . Seizures (HCC) 08/06/2014  . Abnormal  involuntary movement 08/05/2014  . Hemiplegia, unspecified, affecting dominant side 08/05/2014  . Right sided weakness 08/05/2014  . CVA (cerebral vascular accident) (HCC) 07/21/2014  . Left leg pain 07/20/2014  . HTN (hypertension) 07/18/2014  . HLD (hyperlipidemia) 07/18/2014  . Thrombocytopenia (HCC) 07/18/2014  . Essential tremor 10/27/2013  . Obesity, unspecified 10/27/2013  . DM2 (diabetes mellitus, type 2) (HCC) 10/27/2013  . History of peripheral neuropathy 10/27/2013   Past Medical History:  Diagnosis Date  . Anxiety   . Diabetes (HCC) 10/27/2013  . DVT (deep venous thrombosis) (HCC)   . Essential tremor 10/27/2013  . High cholesterol 1999  . Hypertension   . Ischemic stroke (HCC) 07/18/2014  . Obesity, unspecified 10/27/2013  . Pneumonia   . Unspecified hereditary and idiopathic peripheral neuropathy 10/27/2013    Family History  Problem Relation Age of Onset  . Stroke Mother   . Heart Problems Brother   . Coronary artery disease Other   . Heart Problems Sister     Past Surgical History:  Procedure Laterality Date  . CATARACT EXTRACTION Bilateral   . FOOT SURGERY     Social History   Occupational History    Comment: does not work  Tobacco Use  . Smoking status: Never Smoker  . Smokeless tobacco: Never Used  Substance and Sexual Activity  . Alcohol use: No    Alcohol/week: 0.0 oz  . Drug use: No  . Sexual activity: Never

## 2018-05-11 NOTE — Patient Instructions (Signed)
Avoid bending, stooping and avoid lifting weights greater than 10 lbs. Avoid prolong standing and walking. Avoid frequent bending and stooping  No lifting greater than 10 lbs. May use ice or moist heat for pain. Weight loss is of benefit. Handicap license is approved. Bone density test is ordered. Medication for pain associated with spondylolisthesis

## 2018-05-29 ENCOUNTER — Other Ambulatory Visit: Payer: Self-pay | Admitting: Neurology

## 2018-05-29 DIAGNOSIS — G40109 Localization-related (focal) (partial) symptomatic epilepsy and epileptic syndromes with simple partial seizures, not intractable, without status epilepticus: Secondary | ICD-10-CM

## 2018-06-01 ENCOUNTER — Other Ambulatory Visit: Payer: Self-pay | Admitting: Neurology

## 2018-06-01 DIAGNOSIS — E0842 Diabetes mellitus due to underlying condition with diabetic polyneuropathy: Secondary | ICD-10-CM

## 2018-06-03 ENCOUNTER — Other Ambulatory Visit (INDEPENDENT_AMBULATORY_CARE_PROVIDER_SITE_OTHER): Payer: Self-pay | Admitting: Specialist

## 2018-06-04 NOTE — Telephone Encounter (Signed)
DULoxetine request 90 day supply

## 2018-06-04 NOTE — Telephone Encounter (Signed)
DULoxetine request 90 day supply 

## 2018-06-11 ENCOUNTER — Ambulatory Visit: Payer: Medicare Other | Admitting: Neurology

## 2018-06-18 ENCOUNTER — Telehealth (INDEPENDENT_AMBULATORY_CARE_PROVIDER_SITE_OTHER): Payer: Self-pay

## 2018-06-18 NOTE — Telephone Encounter (Signed)
Cathy from [facility] called triage phone left voicemail and states patient is there now for a bone density. States Dx code for osteoporosis they cannot use.   Did you order a Bone Density ?  CB  [phone]   [facility]

## 2018-06-19 ENCOUNTER — Other Ambulatory Visit: Payer: Self-pay | Admitting: Transplant Surgery

## 2018-06-22 ENCOUNTER — Encounter: Payer: Self-pay | Admitting: Family Medicine

## 2018-06-22 ENCOUNTER — Ambulatory Visit (INDEPENDENT_AMBULATORY_CARE_PROVIDER_SITE_OTHER): Payer: Medicare Other | Admitting: Family Medicine

## 2018-06-22 ENCOUNTER — Other Ambulatory Visit: Payer: Self-pay

## 2018-06-22 VITALS — BP 124/66 | HR 67 | Temp 98.0°F | Ht 60.0 in | Wt 158.8 lb

## 2018-06-22 DIAGNOSIS — E785 Hyperlipidemia, unspecified: Secondary | ICD-10-CM | POA: Diagnosis not present

## 2018-06-22 DIAGNOSIS — E118 Type 2 diabetes mellitus with unspecified complications: Secondary | ICD-10-CM | POA: Diagnosis present

## 2018-06-22 DIAGNOSIS — Z23 Encounter for immunization: Secondary | ICD-10-CM

## 2018-06-22 DIAGNOSIS — G250 Essential tremor: Secondary | ICD-10-CM

## 2018-06-22 DIAGNOSIS — I10 Essential (primary) hypertension: Secondary | ICD-10-CM | POA: Diagnosis not present

## 2018-06-22 LAB — POCT GLYCOSYLATED HEMOGLOBIN (HGB A1C): HbA1c, POC (controlled diabetic range): 5.4 % (ref 0.0–7.0)

## 2018-06-22 MED ORDER — ATORVASTATIN CALCIUM 20 MG PO TABS: 20.0000 mg | ORAL_TABLET | Freq: Every evening | ORAL | 3 refills | Status: DC

## 2018-06-22 NOTE — Patient Instructions (Signed)
It was a pleasure to see you today! Thank you for choosing [facility] for your primary care. [name] [name] was seen for physical.   Our plans for today were:  When you go to the eye doctor, please have them fax to [phone].   Please eat foods that you enjoy.   You should return to our clinic to see Dr. Chanetta Marshallimberlake in 6 months for physical.   Best,  Dr. Chanetta Marshallimberlake

## 2018-06-22 NOTE — Progress Notes (Signed)
   CC: DM   HPI  Son interprets.   DM - eating some sugar restricted diet, checking CBGs occasionally. Needs to make eye doctor appt.   Tremor - continues, feels some improvement with primidone, seeing neurology for this.   ROS: Denies CP, SOB, abdominal pain, dysuria, changes in BMs.   CC, SH/smoking status, and VS noted  Objective: BP 124/66   Pulse 67   Temp 98 F (36.7 C) (Oral)   Ht 5' (1.524 m)   Wt 158 lb 12.8 oz (72 kg)   SpO2 93%   BMI 31.01 kg/m  Gen: NAD, alert, cooperative, and pleasant. HEENT: NCAT, EOMI, PERRL CV: RRR, no murmur Resp: CTAB, no wheezes, non-labored Ext: No edema, warm Neuro: Alert and oriented, Speech clear, No gross deficits. Rest tremor of bilateral hands.  Diabetic Foot Check -  Appearance - no lesions, ulcers or calluses Skin - no unusual pallor or redness Monofilament testing -  Right - patient denies sensation in this plantar aspect of this foot, states 2/2 old stroke  Left - Great toe, medial, central, lateral ball and posterior foot intact   Assessment and plan:  DM2 (diabetes mellitus, type 2) Patient diet controlled, a1c continues to be controlled today. Encouraged son to allow patient to enjoy sweets if she desires. Again, no need to check CBGs unless symptomatic concerns.   Essential tremor Continue neurology follow up.   HLD (hyperlipidemia) Family would like recheck lipids today.  Compliant with Lipitor.   Orders Placed This Encounter  Procedures  . Pneumococcal conjugate vaccine 13-valent IM  . CBC  . Basic metabolic panel  . Lipid panel  . HgB A1c    Meds ordered this encounter  Medications  . atorvastatin (LIPITOR) 20 MG tablet    Sig: Take 1 tablet (20 mg total) by mouth every evening.    Dispense:  90 tablet    Refill:  3    Health Maintenance reviewed -family plans to schedule after exam, will have them fax results.  Will provide prescription for tetanus shot.  Loni MuseKate Angelo Prindle, MD, PGY3 06/26/2018  7:59 PM

## 2018-06-23 LAB — BASIC METABOLIC PANEL
BUN/Creatinine Ratio: 18 (ref 12–28)
BUN: 14 mg/dL (ref 8–27)
CALCIUM: 9.4 mg/dL (ref 8.7–10.3)
CO2: 24 mmol/L (ref 20–29)
CREATININE: 0.8 mg/dL (ref 0.57–1.00)
Chloride: 103 mmol/L (ref 96–106)
GFR, EST AFRICAN AMERICAN: 78 mL/min/{1.73_m2} (ref >59)
GFR, EST NON AFRICAN AMERICAN: 67 mL/min/{1.73_m2} (ref >59)
Glucose: 87 mg/dL (ref 65–99)
Potassium: 4.7 mmol/L (ref 3.5–5.2)
SODIUM: 141 mmol/L (ref 134–144)

## 2018-06-23 LAB — LIPID PANEL
CHOL/HDL RATIO: 2.2 ratio (ref 0.0–4.4)
Cholesterol, Total: 170 mg/dL (ref 100–199)
HDL: 77 mg/dL (ref >39)
LDL CALC: 77 mg/dL (ref 0–99)
TRIGLYCERIDES: 78 mg/dL (ref 0–149)
VLDL Cholesterol Cal: 16 mg/dL (ref 5–40)

## 2018-06-23 LAB — CBC
HEMATOCRIT: 43.8 % (ref 34.0–46.6)
HEMOGLOBIN: 14.6 g/dL (ref 11.1–15.9)
MCH: 30.4 pg (ref 26.6–33.0)
MCHC: 33.3 g/dL (ref 31.5–35.7)
MCV: 91 fL (ref 79–97)
Platelets: 107 10*3/uL — ABNORMAL LOW (ref 150–450)
RBC: 4.8 x10E6/uL (ref 3.77–5.28)
RDW: 12.8 % (ref 12.3–15.4)
WBC: 4.2 10*3/uL (ref 3.4–10.8)

## 2018-06-26 MED ORDER — TETANUS-DIPHTH-ACELL PERTUSSIS 5-2.5-18.5 LF-MCG/0.5 IM SUSP: 0.5000 mL | Freq: Once | INTRAMUSCULAR | 0 refills | Status: AC

## 2018-06-26 NOTE — Assessment & Plan Note (Addendum)
Family would like recheck lipids today.  Compliant with Lipitor.

## 2018-06-26 NOTE — Assessment & Plan Note (Addendum)
Patient diet controlled, a1c continues to be controlled today. Encouraged son to allow patient to enjoy sweets if she desires. Again, no need to check CBGs unless symptomatic concerns.

## 2018-06-26 NOTE — Assessment & Plan Note (Signed)
Continue neurology follow up.

## 2018-07-03 ENCOUNTER — Encounter: Payer: Self-pay | Admitting: *Deleted

## 2018-07-21 ENCOUNTER — Other Ambulatory Visit: Payer: Self-pay | Admitting: Family Medicine

## 2018-08-07 ENCOUNTER — Ambulatory Visit
Admission: RE | Admit: 2018-08-07 | Discharge: 2018-08-07 | Disposition: A | Discharge status: Home / Self Care | Payer: Medicare Other | Source: Ambulatory Visit | Attending: Specialist | Admitting: Specialist

## 2018-08-07 ENCOUNTER — Other Ambulatory Visit: Payer: Self-pay

## 2018-08-07 ENCOUNTER — Encounter: Payer: Self-pay | Admitting: Family Medicine

## 2018-08-07 ENCOUNTER — Ambulatory Visit (INDEPENDENT_AMBULATORY_CARE_PROVIDER_SITE_OTHER): Payer: Medicare Other | Admitting: Family Medicine

## 2018-08-07 VITALS — BP 112/64 | HR 79 | Temp 97.9°F | Ht 60.0 in | Wt 155.0 lb

## 2018-08-07 DIAGNOSIS — R109 Unspecified abdominal pain: Secondary | ICD-10-CM | POA: Insufficient documentation

## 2018-08-07 DIAGNOSIS — M48062 Spinal stenosis, lumbar region with neurogenic claudication: Secondary | ICD-10-CM

## 2018-08-07 DIAGNOSIS — M810 Age-related osteoporosis without current pathological fracture: Secondary | ICD-10-CM

## 2018-08-07 MED ORDER — CEPHALEXIN 500 MG PO CAPS: 500.0000 mg | ORAL_CAPSULE | Freq: Three times a day (TID) | ORAL | 0 refills | Status: AC

## 2018-08-07 NOTE — Patient Instructions (Addendum)
Dear [name] [name],   It was nice to meet you today! I am glad you came in for your concerns. This document serves as a "wrap-up" to all that we discussed today and is listed as follows:    You have been experiencing flank pain on the left side for 1 week.  Your son says that you have had this pain before with previous urinary tract infections so we will treat at the same way.  I would like you to start an antibiotic called Keflex.  He will take this 3 times a day for 5 days.  If your symptoms do not improve by this time, please return to the office for further work-up on Monday.  Over the weekend, if you develop fevers, chills, severe pain, please seek care at your closest emergency department or urgent care.  Thank you for choosing [facility] for your primary care needs and stay well!   Best,   Dr. Genia Hotterachel Lyrik Buresh Resident Physician, PGY-1 [facility] [phone]    Don't forget to sign up for MyChart for instant access to your health profile, labs, orders, upcoming appointments or to contact your provider with questions. Stop at the front desk on the way out for more information about how to sign up!

## 2018-08-07 NOTE — Assessment & Plan Note (Addendum)
Patient reports acute flank pain that started 1 week ago.  The pain worsens positionally. She denies changes in bowel movement.  Differential includes pyelonephritis, more likely as her son reports that the symptoms have, previously with UTI. There are no changes in bowel movement, appetite.  Patient denies feeling fullness in her abdomen.  Patient denies recent trauma.  She also has history of lumbar pain.  Patient was unable to urinate despite multiple attempts.  She was sent home on empiric treatment for UTI, Keflex 500 mg 3 times daily.  There were absolutely no appointments available on Monday afternoon when patient was able to come in.  They have made an appointment for Wednesday afternoon.  Patient's son will call on Monday and leave a message about whether she has improved or worsened.  Provided patient with strict return precautions.  If patient still having pain, UTI can be ruled out, other diagnoses can include shingles, abdominal mass, given patient's age can consider spine fracture.

## 2018-08-07 NOTE — Progress Notes (Signed)
SUBJECTIVE:  PCP: Garth [name], Kathryn, MD Patient ID: [id number]  Date of birth: [birth year]  HPI [name] [name] is a 84 y.o. female with past medical history significant for hypertension, diabetes, hyperlipidemia, right-sided weakness, CVA, seizures who presents to clinic with complaints of Left sided flank pain x one week.   #1: Right sided flank pain, acute Location: right flank from spine to anterior hip. Quality: sharp pain. Quantity: moderate.  Timing: hurts more with standing and laying down, with sitting, its not too much pain. When laying down, it is less painful to lay on the right side. Cannot tolerate sleeping on right side or on back. Setting: She noticed the pain started one morning when she woke up. Modifying Factors: tried tylenol without much improvement. Associated: Patient denies dysuria, urgency. Pt Concerns: Son is accompanying patient today and is concerned that she has a UTI as she has had this symptom before with a UTI.   Review of Symptoms:  Review of Systems  Constitutional: Positive for chills, diaphoresis and malaise/fatigue. Negative for fever and weight loss.  Respiratory: Negative for cough, shortness of breath and wheezing.   Gastrointestinal: Negative for blood in stool, constipation, diarrhea, nausea and vomiting.  Genitourinary: Positive for flank pain. Negative for dysuria, frequency, hematuria and urgency.  Musculoskeletal: Negative for falls.  Skin: Negative for rash.  Neurological: Positive for weakness. Negative for dizziness and headaches.   HISTORY Medications, Allergies, Past Medical, Surgical, Social, and Family History Reviewed & Updated per EMR.   Pertinent Historical Findings include:  Medications/Allergies:  Current Outpatient Medications on File Prior to Visit  Medication Sig Dispense Refill  . acetaminophen (TYLENOL) 325 MG tablet Take 2 tablets (650 mg total) by mouth every 6 (six) hours as needed. 30 tablet 0  . ALPRAZolam (XANAX)  0.25 MG tablet Take by mouth.    [name]. amLODipine (NORVASC) 2.5 MG tablet Take 2.5 mg by mouth daily.  0  . atorvastatin (LIPITOR) 20 MG tablet Take 1 tablet (20 mg total) by mouth every evening. 90 tablet 3  . CALCIUM PO Take 1 tablet by mouth daily.    . Cholecalciferol (VITAMIN D PO) Take 1 tablet by mouth daily.    . clopidogrel (PLAVIX) 75 MG tablet TAKE 1 TABLET (75 MG TOTAL) BY MOUTH DAILY. 90 tablet 1  . DULoxetine (CYMBALTA) 20 MG capsule TAKE 1 CAPSULE BY MOUTH EVERY DAY 90 capsule 1  . furosemide (LASIX) 20 MG tablet Take 20 mg by mouth daily.  3  . gabapentin (NEURONTIN) 100 MG capsule TAKE 1 CAPSULE BY MOUTH TWICE A DAY 180 capsule 0  . levETIRAcetam (KEPPRA) 500 MG tablet TAKE 1 TABLET (500 MG TOTAL) BY MOUTH 2 (TWO) TIMES DAILY. 180 tablet 0  . Omega-3 Fatty Acids (FISH OIL) 1000 MG CAPS Take by mouth.    [name]. PAZEO 0.7 % SOLN     . potassium chloride SA (K-DUR,KLOR-CON) 20 MEQ tablet Take by mouth.    . primidone (MYSOLINE) 250 MG tablet TAKE 1 TABLET (250 MG TOTAL) BY MOUTH 2 (TWO) TIMES DAILY. 180 tablet 1  . RESTASIS 0.05 % ophthalmic emulsion Place 1 drop into both eyes 2 (two) times daily.   4  . VOLTAREN 1 % GEL APPLY 2 G TOPICALLY 4 (FOUR) TIMES DAILY. 100 g 0   No current facility-administered medications on file prior to visit.    Allergies  Allergen Reactions  . Tape Rash    Please do not use Plastic Tape    Past Medical  History:   Patient Active Problem List   Diagnosis Date Noted  . Umbilical pain 10/31/2017  . Lumbar pain 10/10/2016  . Pelvic pain 10/10/2016  . Seizures (HCC) 08/06/2014  . Abnormal involuntary movement 08/05/2014  . Hemiplegia, unspecified, affecting dominant side 08/05/2014  . Right sided weakness 08/05/2014  . CVA (cerebral vascular accident) (HCC) 07/21/2014  . Left leg pain 07/20/2014  . HTN (hypertension) 07/18/2014  . HLD (hyperlipidemia) 07/18/2014  . Thrombocytopenia (HCC) 07/18/2014  . Essential tremor 10/27/2013  . Obesity,  unspecified 10/27/2013  . DM2 (diabetes mellitus, type 2) (HCC) 10/27/2013  . History of peripheral neuropathy 10/27/2013    Surgeries:  Past Surgical History:  Procedure Laterality Date  . CATARACT EXTRACTION Bilateral   . FOOT SURGERY      Family History:  She indicated that her mother is deceased. She indicated that her father is deceased. She indicated that the status of her sister is unknown. She indicated that her brother is deceased. She indicated that her daughter is alive. She indicated that all of her four sons are alive. She indicated that the status of her other is unknown.  Social History:   reports that she has never smoked. She has never used smokeless tobacco. She reports that she does not drink alcohol or use drugs.  OBJECTIVE:  BP 112/64   Pulse 79   Temp 97.9 F (36.6 C) (Oral)   Ht 5' (1.524 m)   Wt 155 lb (70.3 kg)   SpO2 97%   BMI 30.27 kg/m  Physical Exam:  Gen: NAD, alert, non-toxic, well-nourished, well-appearing, sitting comfortably  HEENT: Normocephaic, atraumatic. Clear conjuctiva, no scleral icterus and injection.  CV: RRR.  Normal S1-S2.   Normal capillary refill bilaterally.  Radial pulses 2+ bilaterally. No bilateral lower extremity edema. Resp: CTAB.  No wheezing, rales, abnormal lung sounds.  No increased WOB appreciated. Abd: Nontender and nondistended on palpation to all 4 quadrants.  Positive bowel sounds.  Back: No rash or obvious deformity in specified area of pain. There is pain with palpation that radiates from the low thoracic and lumbar spine  that is continuous to ASIS.  Pertinent Labs & Imaging:   UA: Unable to obtain.   ASSESSMENT & PLAN:  [name] [name] is a 84 y.o. female with complex past medical history, who presents with complaints of right sided flank pain x 1 week.   Acute flank pain Patient reports acute flank pain that started 1 week ago.  The pain worsens positionally. She denies changes in bowel movement.    Differential includes pyelonephritis, more likely as her son reports that the symptoms have, previously with UTI. There are no changes in bowel movement, appetite.  Patient denies feeling fullness in her abdomen.  Patient denies recent trauma.  She also has history of lumbar pain.  Patient was unable to urinate despite multiple attempts.  She was sent home on empiric treatment for UTI, Keflex 500 mg 3 times daily.  There were absolutely no appointments available on Monday afternoon when patient was able to come in.  They have made an appointment for Wednesday afternoon.  Patient's son will call on Monday and leave a message about whether she has improved or worsened.  Provided patient with strict return precautions.  If patient still having pain, UTI can be ruled out, other diagnoses can include shingles, abdominal mass, given patient's age can consider spine fracture.    Health Maintenance Due  Topic Date Due  . OPHTHALMOLOGY EXAM  11/15/1942  . TETANUS/TDAP  11/16/1951  . DEXA SCAN  11/15/1997  . INFLUENZA VACCINE  06/25/2018    Genia Hotter, M.D. [facility]  PGY -1 08/07/2018, 2:53 PM

## 2018-08-10 ENCOUNTER — Telehealth: Payer: Self-pay | Admitting: *Deleted

## 2018-08-10 NOTE — Telephone Encounter (Signed)
Pts son wants to let Dr. Selena BattenKim know that pt is feeling better (she requested they call with an update).  They will call cancel Wed am to cancel appt if needed. Mickel Schreur, Maryjo RochesterJessica Dawn, CMA

## 2018-08-12 ENCOUNTER — Ambulatory Visit: Payer: Medicare Other

## 2018-08-18 ENCOUNTER — Ambulatory Visit: Payer: Medicare Other | Admitting: Neurology

## 2018-08-27 ENCOUNTER — Other Ambulatory Visit: Payer: Self-pay | Admitting: Neurology

## 2018-08-27 DIAGNOSIS — G40109 Localization-related (focal) (partial) symptomatic epilepsy and epileptic syndromes with simple partial seizures, not intractable, without status epilepticus: Secondary | ICD-10-CM

## 2018-08-29 ENCOUNTER — Other Ambulatory Visit: Payer: Self-pay | Admitting: Neurology

## 2018-08-29 DIAGNOSIS — E0842 Diabetes mellitus due to underlying condition with diabetic polyneuropathy: Secondary | ICD-10-CM

## 2018-09-03 ENCOUNTER — Other Ambulatory Visit: Payer: Self-pay | Admitting: Neurology

## 2018-09-03 DIAGNOSIS — G40109 Localization-related (focal) (partial) symptomatic epilepsy and epileptic syndromes with simple partial seizures, not intractable, without status epilepticus: Secondary | ICD-10-CM

## 2018-09-07 ENCOUNTER — Encounter: Payer: Self-pay | Admitting: Neurology

## 2018-09-07 ENCOUNTER — Encounter

## 2018-09-07 ENCOUNTER — Ambulatory Visit (INDEPENDENT_AMBULATORY_CARE_PROVIDER_SITE_OTHER): Payer: Medicare Other | Admitting: Neurology

## 2018-09-07 VITALS — BP 152/84 | HR 80 | Ht <= 58 in | Wt 158.0 lb

## 2018-09-07 DIAGNOSIS — G40109 Localization-related (focal) (partial) symptomatic epilepsy and epileptic syndromes with simple partial seizures, not intractable, without status epilepticus: Secondary | ICD-10-CM

## 2018-09-07 DIAGNOSIS — E0842 Diabetes mellitus due to underlying condition with diabetic polyneuropathy: Secondary | ICD-10-CM

## 2018-09-07 DIAGNOSIS — G250 Essential tremor: Secondary | ICD-10-CM

## 2018-09-07 MED ORDER — LEVETIRACETAM 500 MG PO TABS: 500.0000 mg | ORAL_TABLET | Freq: Two times a day (BID) | ORAL | 3 refills | Status: DC

## 2018-09-07 MED ORDER — GABAPENTIN 100 MG PO CAPS: 100.0000 mg | ORAL_CAPSULE | Freq: Every day | ORAL | 3 refills | Status: DC

## 2018-09-07 MED ORDER — LEVETIRACETAM 500 MG PO TABS: 500.0000 mg | ORAL_TABLET | Freq: Two times a day (BID) | ORAL | 5 refills | Status: DC

## 2018-09-07 MED ORDER — PRIMIDONE 250 MG PO TABS: 250.0000 mg | ORAL_TABLET | Freq: Two times a day (BID) | ORAL | 3 refills | Status: DC

## 2018-09-07 NOTE — Patient Instructions (Addendum)
We will further reduce your gabapentin to only night time: 100 mg each night only. We will try to take you off of it next time.   We will keep the seizure medicine and the tremor medicine the same.   Try to drink water and try exercise.   We will see you back in 6 months.

## 2018-09-07 NOTE — Progress Notes (Signed)
Subjective:    Patient ID: [name] [name] is a 84 y.o. female.  HPI     Interim history:   [name] [name] is a very pleasant 84 year old Middle Eastern/[place]ian lady with an underlying medical history of hypertension, hyperlipidemia, DVT, pneumonia, anxiety, obesity, diabetes, peripheral neuropathy, stroke and essential tremor, who presents for follow-up consultation of her essential tremor and history of seizure, history of stroke. The patient is accompanied by her son again today. I last saw her on 07/08/2017 for a sooner than scheduled appointment because she felt that the tremor was worse, she also had intermittent leg cramps. She had issues with low back pain. She had a recent lumbar spine MRI. She was supposed to see Dr. Ernestina Patches for injection into the spine. She was advised to continue with low-dose gabapentin, Mysoline twice daily, and Keppra twice daily.  Today, 09/07/2018: She reports doing okay. She has been seeing Dr. Ernestina Patches. She recently saw Dr. Louanne Skye.  She had blood work recently on 06/22/2018 which I reviewed, she had lipid panel, BMP, CBC and A1c (5.4).  She still has some back pain. Bone density test ordered by orthopedics on 08/07/2018. She has evidence of osteopenia. Thankfully, she has not fallen. Her right side has residual weakness from the previous stroke. She has some grip strength weakness in the left hand. She tries to use both hands to knit. Cognitively, no new concerns, per son, has good memory. She does have numbness in her distal lower extremities but no painful sensations. We have previously reduced her gabapentin secondary to sedation.    Previously:   I saw her on 11/11/2016, at which time her tremor was mostly stable, she had no recent falls. Her right leg weakness was about the same as well. She did present to the emergency room in November 2017 for back pain and leg pain. She was found to have a UTI.  She was also found on CT lumbar spine to have a L1 vertebral body  fracture, old.    She was seen in the interim by Ward Givens on 05/13/2017, at which time she was noted to be fairly stable and her medications were kept the same.   She presented in the interim to the emergency room on 05/31/2017 for hip pain on the left. She saw her family physician on 07/02/2017.   I saw her on 05/08/2016, at which time she had recently fallen about 10 days prior. She had visited her grandmother daughter who is expecting a baby and fell backwards landed on her back. She had left-sided back pain since then. She went to the emergency room on 05/04/2016 and was given an injection with Decadron, Toradol, Dilaudid, but did not have much in way of relief. She had a bone scan on 05/06/16, which I reviewed: IMPRESSION: 1. No evidence of a recent fracture. 2. No evidence of neoplastic disease to bone. 3. Significant degenerative uptake noted along spine as well as involving multiple bilateral joints. She reported that her tremor was worse. She had more stress and also more pain.   I suggested she continue with her Keppra, Plavix, Mysoline and Neurontin.   I saw her on 09/25/15, at which time she felt she was doing well. She was worried about her brother in [place]. She was not able to travel to [place] and was sad about it. She was planning to fly to [place] to be with her daughter and other family for some time. No recent seizures or strokelike symptoms were reported. Tremor  was stable. She was using a cane and had no recent falls. I suggested we continue with Keppra, Plavix, gabapentin and Mysoline at the same doses.   I saw her on 04/26/2015 at which time she reported difficulty with her balance. Her tremor had become a little worse. She had had no recent seizures or auras. She was able to tolerate Keppra 500 mg twice daily. She was on the same dose of Mysoline for her tremors. She had benefited from physical therapy at [facility] and requested to go back for outpatient  physical therapy. She had not fallen. She was using a 4 pronged cane and was no longer using her walker.   I saw her on 11/16/2014, at which time her son reported that she was doing well. She was in outpatient physical therapy and was using a 2 wheeled walker. She was taking gabapentin at a lower dose. Lowering gabapentin had improved her heavy headedness in the morning. Her tremor was stable. She was tolerating Keppra 500 mg twice daily with no recent seizures or auras reported. Overall she has done well. I continued her on the same dose of primidone, the same dose of Keppra, and suggested we reduce her gabapentin further to 100 mg in the morning and 200 mg in the evening.    Of note, they canceled an appointment for 03/17/2015.   I saw her on 08/19/2014 at which time she presented after her hospitalization for stroke. Her son reported that Aggrenox was causing her headaches and she was switched to Plavix. We talked about sleep apnea screening. She had no witnessed apneas and only mild snoring. She had done fairly well. They requested a referral to outpatient therapy in [facility]. She was placed on Keppra for episodic twitching. She had had some medication changes when she was hospitalized for her stroke. She was advised to use her 2 wheeled walker with assistance. I referred her to physical therapy outpatient. We talked about secondary stroke prevention. I suggested she reduce gabapentin to 200 mg twice daily down from 300 mg twice daily because of dizziness and head heaviness reported. This continued on Mysoline for her essential tremor.   I saw her on 10/27/2013 at which time she presented for followup of her long-standing history of essential tremor. I felt she was stable in that regard and felt that her diabetes had resulted in diabetic neuropathy. I asked her to continue with Mysoline at the same strength and increased her gabapentin to 3 pills at night for a total of 300 mg each bedtime.   In the  interim she presented to the emergency room on 07/18/2014 with new onset right-sided weakness, facial droop, slurring of speech which started around 6 PM the night before. She was in [place], [place] visiting family when she had acute onset of the symptoms. She was seen in [place] at the hospital and CT head was negative for bleed but she did not receive TPA because of low platelet count of 90,000. She had improvement of her symptoms but persistent deficits. She was admitted to the hospital and had a complete stroke workup. She had an MRI of the brain as well as MRA head and was found to have a left thalamic ischemic stroke. Neurology was consulted. MRA showed left PCA occlusion. Echocardiogram showed an EF of 60-65% with no wall motion abnormalities and no source of emboli. Carotid Doppler studies were negative for any significant ICA stenosis (1-39%). Hb A1c was 6. LDL was 62. She  was placed on aspirin for secondary stroke prevention and then changed to Aggrenox. Blood work showed normal B12 levels, negative hepatitis serology, chest x-ray was negative. EKG did not show any ST abnormalities. She had some left leg pain. Lower extremity Doppler studies from 07/20/2014 were negative for DVT. She was discharged on 07/21/2014 to inpatient rehabilitation. She was discharged from rehabilitation on 08/02/2014. I reviewed hospital records as well as imaging tests. Her daughter called and asked for a sooner than scheduled appointment because of worsening weakness. However, she missed an appointment on 08/05/2014 because she was readmitted to the hospital on 08/04/2014 for slurring of speech and shaking spells. I reviewed the hospital records. She had an EEG, which was negative for seizure activity but she was felt to have partial onset seizures and was started on Keppra which is currently at 500 mg twice daily. She was changed from Aggrenox to Plavix 75 mg.   She had a brain MRI without contrast on  08/05/2014: 1. Persistent 1.1 cm focus of linear restricted diffusion within the lateral left thalamus near the posterior limb of the left internal capsule. Finding is favored to reflect resolving infarct from the 07/19/2014 study given the relative decreased signal intensity and size from that prior exam, although possible acute re-infarction of this area is not entirely excluded. 2. No other acute intracranial abnormality. In addition, I personally reviewed the images through the PACS system. She had an EEG on 08/06/2014: This was reported as normal in the asleep and awake states. Her son called on 08/15/2014 to make an appointment.   I first met her on 01/19/2013, at which time I increased her Mysoline 250 mg strength 1-1/2 pills to 2 pills daily. I did advise her of the potential increase risk of side effects. She previously followed with Dr. Morene Antu. She has a billateral UE and head tremor for over 25 years. She was tried on primidone while she was in [place] with improvement in tremor. It was discontinued when she came to the [place] because she was on Coumadin. Her tremor affects her ability to feed herself, write, and put on her makeup and is worse with anxiety. Her daughter has a tremor as well. She was restarted on primidone by Dr. Erling Cruz in 3/12. She is no longer on coumadin. She was on coumadin for a history of DVT in her left leg following left foot injury. She has had numbness in her legs without pain. EMG/NCV 02/27/11 was normal except for some denervation in the left lumbosacral paraspinal muscles. B12 level was normal. She is on gabapentin 100 mg 2 at night with relief of symptoms. Her major complaint is that of ongoing tremor, which is worse in the morning. She has not had side effects such as grogginess or difficulty walking with the primidone. She no longer cooks. Her tremor involves her writing and abilities to feed herself. She also continues to take gabapentin '100mg'$  2 qHS for her  neuropathy. She has not fallen per son. She was supposed to come back in 3 months but missed several appointments. She has been diagnosed with DM and has been started on metforim. She feels, her numbness and tingling is worse in her feet and she has now has started noticing burning sensation in her feet.  Her Past Medical History Is Significant For: Past Medical History:  Diagnosis Date  . Anxiety   . Diabetes ([place]) 10/27/2013  . DVT (deep venous thrombosis) ([place])   . Essential tremor 10/27/2013  .  High cholesterol 1999  . Hypertension   . Ischemic stroke ([place]) 07/18/2014  . Obesity, unspecified 10/27/2013  . Pneumonia   . Unspecified hereditary and idiopathic peripheral neuropathy 10/27/2013    Her Past Surgical History Is Significant For: Past Surgical History:  Procedure Laterality Date  . CATARACT EXTRACTION Bilateral   . FOOT SURGERY      Her Family History Is Significant For: Family History  Problem Relation Age of Onset  . Stroke Mother   . Heart Problems Brother   . Coronary artery disease Other   . Heart Problems Sister     Her Social History Is Significant For: Social History   Socioeconomic History  . Marital status: Widowed    Spouse name: Not on file  . Number of children: 5  . Years of education: 9  . 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 Financial resource strain: Not on file  . Food insecurity:    Worry: Not on file    Inability: Not on file  . Transportation needs:    Medical: Not on file    Non-medical: Not on file  Tobacco Use  . Smoking status: Never Smoker  . Smokeless tobacco: Never Used  Substance and Sexual Activity  . Alcohol use: No    Alcohol/week: 0.0 standard drinks  . Drug use: No  . Sexual activity: Never  Lifestyle  . Physical activity:    Days per week: Not on file    Minutes per session: Not on file  . Stress: Not on file  Relationships  . Social connections:     Talks on phone: Not on file    Gets together: Not on file    Attends religious service: Not on file    Active member of club or organization: Not on file    Attends meetings of clubs or organizations: Not on file    Relationship status: Not on file  Other Topics Concern  . Not on file  Social History Narrative   Patient is right handed and resides with son    Her Allergies Are:  Allergies  Allergen Reactions  . Tape Rash    Please do not use Plastic Tape  :   Her Current Medications Are:  Outpatient Encounter Medications as of 09/07/2018  Medication Sig  . acetaminophen (TYLENOL) 325 MG tablet Take 2 tablets (650 mg total) by mouth every 6 (six) hours as needed.  . ALPRAZolam (XANAX) 0.25 MG tablet Take by mouth.  [name] amLODipine (NORVASC) 2.5 MG tablet Take 2.5 mg by mouth daily.  [name] atorvastatin (LIPITOR) 20 MG tablet Take 1 tablet (20 mg total) by mouth every evening.  [name] CALCIUM PO Take 1 tablet by mouth daily.  . Cholecalciferol (VITAMIN D PO) Take 1 tablet by mouth daily.  . clopidogrel (PLAVIX) 75 MG tablet TAKE 1 TABLET (75 MG TOTAL) BY MOUTH DAILY.  . DULoxetine (CYMBALTA) 20 MG capsule TAKE 1 CAPSULE BY MOUTH EVERY DAY  . furosemide (LASIX) 20 MG tablet Take 20 mg by mouth daily.  [name] gabapentin (NEURONTIN) 100 MG capsule TAKE 1 CAPSULE BY MOUTH TWICE A DAY  . levETIRAcetam (KEPPRA) 500 MG tablet TAKE 1 TABLET (500 MG TOTAL) BY MOUTH 2 (TWO) TIMES DAILY.  [name] Omega-3 Fatty Acids (FISH OIL) 1000 MG CAPS Take by mouth.  [name] PAZEO 0.7 % SOLN   . potassium chloride SA (K-DUR,KLOR-CON) 20 MEQ tablet Take by mouth.  [name]  primidone (MYSOLINE) 250 MG tablet TAKE 1 TABLET (250 MG TOTAL) BY MOUTH 2 (TWO) TIMES DAILY.  [name] RESTASIS 0.05 % ophthalmic emulsion Place 1 drop into both eyes 2 (two) times daily.   . VOLTAREN 1 % GEL APPLY 2 G TOPICALLY 4 (FOUR) TIMES DAILY.   No facility-administered encounter medications on file as of 09/07/2018.   :  Review of Systems:  Out of a complete  14 point review of systems, all are reviewed and negative with the exception of these symptoms as listed below: Review of Systems  Neurological:       Pt presents today to discuss her keppra. Pt reports that she is doing well.    Objective:  Neurological Exam  Physical Exam Physical Examination:   Vitals:   09/07/18 1304  BP: (!) 180/102  Pulse: 80   General Examination: The patient is a very pleasant 84 y.o. female in no acute distress. She appears well-developed and well-nourished and well groomed. Good spirits.  Repeat BP: 152/82.   HEENT:Normocephalic, atraumatic, pupils are equal, round and reactive to light and accommodation. Hearing isgrosslyintact. Extraocular tracking is good without nystagmus. She has a moderate degree of headtremor, no-no type, stable from last time. She has a mild degree of voice tremor. Sheisnot dysarthric. Oropharynx is clear. She has mild airway crowding, dentures. Neck shows FROM.    Chest:Clear to auscultation without wheezing, rhonchi or crackles noted.  Heart:S1+S2+0, regular and normal without murmurs, rubs or gallops noted.   Abdomen:Soft, non-tender and non-distended with normal bowel sounds appreciated on auscultation.  Extremities:There is  trace pitting edema, right more than left around the ankles.   Skin: Warm and dry without trophic changes noted. There are no varicose veins.  Musculoskeletal: exam reveals no obvious joint deformities, tenderness or joint swelling or erythema, no obvious pain.   Neurologically:  Mental status: The patient is awake, alert and oriented in all 4 spheres. His memory, attention, language and knowledge are appropriate. There is no aphasia, agnosia, apraxia or anomia. Speech is clear with normal prosody and enunciation. Thought process is linear. Mood is congruent and affect is normal.  Cranial nerves are as described above under HEENT exam.  Motor exam: thin bulk, global strength of 4 out of  5, perhaps a little weaker in R hip flexor, reflexes are 1+, absent in both ankles. Fine motor skills are globally mildly impaired in the upper and lower extremities. Romberg is not tested for safety reasons. Bilateral upper extremity tremors including a mild resting tremor in the right upper extremity, postural tremor both upper extremities and action tremor in the moderate degree in both upper extremities. Cerebellar testing shows no dysmetria or intention tremor.  Sensory exam: intact to light touch.  Gait, station and balance: She stands up slowly  and requires assistance, walks slowly and cautiously, tandem walk is not possible, balance is impaired. She has a mildly stooped posture. Uses a 4 prong cane.   Assessment and Plan:   In summary, [name] [name] is a very pleasant 84 year old female with a history of obesity, previous DVT, ET, stroke, partial seizure in the past, who presents for follow-up consultation of her essential tremor of many years duration, history of stroke, history of partial complex seizures. She has been stable thankfully, no recent falls, tolerating her medicines. Since she does not have much in the way of painful neuropathy we mutually agreed to further reduce her gabapentin with the hope to take her off of it by the  next appointment. She is therefore advised to take 100 mg at bedtime only. We will continue with Mysoline 250 mg twice daily and Keppra 500 mg twice daily. She has preserved kidney function, had lab work in late July 2019. I renewed her prescriptions for 90 days with refills. I would like to see her back in 6 months, sooner if needed. She is advised to stay well hydrated, try to stay active mentally and physically within her limitations of course and also exercise her fine motor skills and strength in both hands. I answered all their questions today and the patient and her son were in agreement.  I spent 25 minutes in total face-to-face time with the patient,  more than 50% of which was spent in counseling and coordination of care, reviewing test results, reviewing medication and discussing or reviewing the diagnosis of ET, Sz and Hx of stroke, the prognosis and treatment options. Pertinent laboratory and imaging test results that were available during this visit with the patient were reviewed by me and considered in my medical decision making (see chart for details).

## 2018-11-05 ENCOUNTER — Other Ambulatory Visit: Payer: Self-pay | Admitting: Internal Medicine

## 2018-11-05 ENCOUNTER — Telehealth: Payer: Self-pay | Admitting: *Deleted

## 2018-11-05 DIAGNOSIS — Z1231 Encounter for screening mammogram for malignant neoplasm of breast: Secondary | ICD-10-CM

## 2018-11-05 NOTE — Telephone Encounter (Signed)
Pt son in office today states that his mom needs test strips.  I did not see it on her med list. son said that it is probably from when she was with [place]. Lamonte SakaiZimmerman Rumple, April D, [place]

## 2018-11-05 NOTE — Telephone Encounter (Signed)
Called pt son to ask about below.  If he calls back please find out what kind of meter the pt uses so we can get the correct testing strips ordered. Lamonte SakaiZimmerman Rumple, April D, [place]

## 2018-11-05 NOTE — Telephone Encounter (Signed)
Looks like her previous meter was accu check aviva - can you call son and confirm this? If that is correct, please route it back to me and I will send rx for strips for that meter.

## 2018-11-05 NOTE — Telephone Encounter (Signed)
Pt is schedule next week with Dr. Parke SimmersBland due to not feeling well and then scheduled with PCP in January for diabetes check up. [name] [name], April D, [place]

## 2018-11-09 NOTE — Telephone Encounter (Signed)
2nd message left to have pt son to call office back to inquire about below.  If he calls back please confirm what type meter pt has so we can order correct strips for this meter. [name] [name], April D, [place]

## 2018-11-10 ENCOUNTER — Ambulatory Visit (INDEPENDENT_AMBULATORY_CARE_PROVIDER_SITE_OTHER): Payer: Medicare Other | Admitting: Family Medicine

## 2018-11-10 ENCOUNTER — Encounter: Payer: Self-pay | Admitting: Family Medicine

## 2018-11-10 ENCOUNTER — Other Ambulatory Visit: Payer: Self-pay

## 2018-11-10 VITALS — BP 132/72 | HR 68 | Temp 97.6°F | Ht 60.0 in | Wt 158.0 lb

## 2018-11-10 DIAGNOSIS — R5383 Other fatigue: Secondary | ICD-10-CM | POA: Diagnosis present

## 2018-11-10 DIAGNOSIS — I5032 Chronic diastolic (congestive) heart failure: Secondary | ICD-10-CM | POA: Diagnosis not present

## 2018-11-10 DIAGNOSIS — R0902 Hypoxemia: Secondary | ICD-10-CM | POA: Insufficient documentation

## 2018-11-10 DIAGNOSIS — Z9981 Dependence on supplemental oxygen: Secondary | ICD-10-CM

## 2018-11-10 DIAGNOSIS — M8589 Other specified disorders of bone density and structure, multiple sites: Secondary | ICD-10-CM | POA: Insufficient documentation

## 2018-11-10 NOTE — Assessment & Plan Note (Addendum)
Will order CBC, TSH, home DME oxygen after patient failed ambulatory pulse ox test with 88% while ambulating on room air Patient to follow-up in 2 weeks with PCP for check in. Son agrees to take her to follow-up with cardiologist within the next week or 2 cardiologist which is on vacation but we discussed how important is for her to see him given this new hypoxemia with exertion.

## 2018-11-10 NOTE — Progress Notes (Signed)
Subjective:  [name] [name] is a 84 y.o. female who presents to the [facility] today with a chief complaint of fatigue with ambulation.   HPI: Patient is 84 year old female with increasing fatigue with ambulation over the past 3 to 4 months.  In the past she could go shopping with her son and could walk for 15 minutes before getting tired.  Over the last 3 to 4 months this is changed to where she can only ambulate for about 3 minutes before she has to stop and rest.  She decides that she is short of breath and that she does not feel this is necessarily leg pain but she is just getting tired.  She denies chest pain.  She denies any significant coughing or change in leg swelling.  She has not had any loss of consciousness or vertigo symptoms.  And denies any recent injury.  She has history significant for past stroke with residual chronic neurogenic shaking followed by neurology.  She has a recent diagnosis of osteopenia being followed by orthopedics.  She also has history of multi valvular mild regurg which is being followed by cardiology   Objective:  Physical Exam: BP 132/72   Pulse 68   Temp 97.6 F (36.4 C) (Oral)   Ht 5' (1.524 m)   Wt 158 lb (71.7 kg)   SpO2 (!) 88% Comment: when ambulating  BMI 30.86 kg/m   Gen: NAD, resting comfortably, chronic neurogenic shaking CV: RRR with 3/6 murmur, +1 pitting edema in legs bilaterally Pulm: NWOB at rest, CTAB with no crackles, wheezes, or rhonchi GI: Normal bowel sounds present. Soft, Nontender, Nondistended, abdominal obesity. MSK: +1 edema, cyanosis, or clubbing noted Skin: No venous stasis changes obvious Neuro: Chronic essential tremor, moves all extremities Psych: Normal affect and thought content, pleasant  Patient did fail ambulatory pulse ox.  She was 98% when resting on room air, within 20 yards of ambulating on room air O2 sats dropped to 88 and she became too fatigued to continue she needed assistance to get back to the room.  We  did not have portable oxygen and she was too tired to further test for ambulation with oxygen but her hypoxemia with exertion meets criteria for home oxygen  No results found for this or any previous visit (from the past 72 hour(s)).   Assessment/Plan:  Fatigue Will order CBC, TSH, home DME oxygen after patient failed ambulatory pulse ox test with 88% while ambulating on room air  Hypoxemia requiring supplemental oxygen Will order CBC, TSH, home DME oxygen after patient failed ambulatory pulse ox test with 88% while ambulating on room air Patient to follow-up in 2 weeks with PCP for check in. Son agrees to take her to follow-up with cardiologist within the next week or 2 cardiologist which is on vacation but we discussed how important is for her to see him given this new hypoxemia with exertion.  Osteopenia of multiple sites Patient with osteopenia of multiple sites as shown by recent bone density scan.  Son says that she follows with a orthopedic doctor and that she has been given a medicine for this.  We cannot see this medicine in epic but he says that she has a follow-up within the month with Ortho and he will discuss potential bisphosphonate with the orthopedic doctor.  We discussed some risks and benefits of this medication along with the significant risk of fracture if she were to fall which is of significant concern given that she walks  with a walker and has an essential tremor   Marthenia RollingScott Kineta Fudala, DO FAMILY MEDICINE RESIDENT - PGY2 11/10/2018 5:19 PM

## 2018-11-10 NOTE — Telephone Encounter (Signed)
Pt is in clinic today to see Dr. Parke SimmersBland.  Ann MakiSone inquired about this.  States that he was sorry he missed the calls, but he did verify that she still uses the accuchek aviva. Glenis Musolf, Maryjo RochesterJessica Dawn, CMA

## 2018-11-10 NOTE — Assessment & Plan Note (Signed)
Will order CBC, TSH, home DME oxygen after patient failed ambulatory pulse ox test with 88% while ambulating on room air

## 2018-11-10 NOTE — Progress Notes (Signed)
Patient O2 saturations in office  Room air @ rest: 98% Room air while ambulating: 88%  We were only able to ambulate about 20 yards before pt has to stop and rest.  Partly to being out of breath but also because of weakness and unsteady gait. Due to patients limitations I was unable to ambulate with oxygen to check saturations rate.  I have completed a form for APS (adult and pediatric specialist) who can have the oxygen to her tomorrow.  Will fax form, demographics, order and notes to [phone] and place copy of form in batch scanning. Kandie Keiper, Maryjo RochesterJessica Dawn, CMA

## 2018-11-10 NOTE — Assessment & Plan Note (Signed)
Patient with osteopenia of multiple sites as shown by recent bone density scan.  Son says that she follows with a orthopedic doctor and that she has been given a medicine for this.  We cannot see this medicine in epic but he says that she has a follow-up within the month with Ortho and he will discuss potential bisphosphonate with the orthopedic doctor.  We discussed some risks and benefits of this medication along with the significant risk of fracture if she were to fall which is of significant concern given that she walks with a walker and has an essential tremor

## 2018-11-10 NOTE — Patient Instructions (Signed)
It was a pleasure to see you today! Thank you for choosing [facility] for your primary care. [name] [name] was seen for fatigue and difficulty walking for long periods of time. Come back to the clinic if you have any more difficulties, and go to the emergency room if you have any life-threatening symptoms.  Today we talked about your fatigue.  When we had you walk your oxygen numbers went lower than they should so we are ordering you some home oxygen which should help out with that problem.  We are also going to get a lab test to check for anemia and check your thyroid.  If either of those are abnormal and require treatment I will call you myself.  It may take a few days to get your oxygen but once we get it to you it is important that you wear it, we highly encourage that you call your cardiologist and meet with them as soon as possible because this may be heart related.  If we did any lab work today that did not result today, one of two things will happen.  1. If everything is normal, you will get a letter in mail sent to the address in your chart with the results for your records.  It is important to keep your address up to date as that is where we will send results.  2. If the results require some sort of discussion, my nurses or myself will call you on the phone number listed in your records.  It is important to keep your phone number up to date in our system as this is how we will try to reach you.  If we cannot reach you on the phone, we will try to send you a letter in the mail so please enable to voicemail function of your phone.  If you don't hear from us in two weeks, please give us a call to verify your results. Otherwise, we look forward to seeing you again at your next visit. If you have any questions or concerns before then, please call the clinic at [phone].   Please bring all your medications to every doctors visit   Sign up for My Chart to have easy access to your  labs results, and communication with your Primary care physician.     Please check-out at the front desk before leaving the clinic.     Best,  Dr. Marthenia RollingScott Nehemiah Mcfarren FAMILY MEDICINE RESIDENT - PGY2 11/10/2018 4:47 PM

## 2018-11-11 ENCOUNTER — Ambulatory Visit: Payer: Medicare Other

## 2018-11-11 ENCOUNTER — Telehealth: Payer: Self-pay | Admitting: *Deleted

## 2018-11-11 DIAGNOSIS — I5032 Chronic diastolic (congestive) heart failure: Secondary | ICD-10-CM | POA: Insufficient documentation

## 2018-11-11 DIAGNOSIS — Z9981 Dependence on supplemental oxygen: Principal | ICD-10-CM

## 2018-11-11 DIAGNOSIS — R0902 Hypoxemia: Secondary | ICD-10-CM

## 2018-11-11 LAB — CBC
HEMATOCRIT: 44.4 % (ref 34.0–46.6)
HEMOGLOBIN: 14.6 g/dL (ref 11.1–15.9)
MCH: 30.6 pg (ref 26.6–33.0)
MCHC: 32.9 g/dL (ref 31.5–35.7)
MCV: 93 fL (ref 79–97)
Platelets: 115 10*3/uL — ABNORMAL LOW (ref 150–450)
RBC: 4.77 x10E6/uL (ref 3.77–5.28)
RDW: 12.4 % (ref 12.3–15.4)
WBC: 4.3 10*3/uL (ref 3.4–10.8)

## 2018-11-11 LAB — TSH: TSH: 1.6 u[IU]/mL (ref 0.450–4.500)

## 2018-11-11 MED ORDER — GLUCOSE BLOOD VI STRP: ORAL_STRIP | 12 refills | Status: DC

## 2018-11-11 NOTE — Telephone Encounter (Signed)
I sent the strips to the pharmacy. Called son to let him know, cell phone is not in service. Tried work phone - he answered and I let him know I sent the strips - he checked again and it was accu check.

## 2018-11-11 NOTE — Telephone Encounter (Signed)
Receive fax from pharmacy.  Needed signed by [facility] provider, Dx code, how many times a day. Fleeger, Maryjo RochesterJessica Dawn, CMA

## 2018-11-11 NOTE — Telephone Encounter (Signed)
Received call back from MangoLorna.  There are two issues / reasons patient does not qualify for oxygen.   1. They must have the exertion with oxygen in order to qualify.  Spoke with her about pts limitations documented in the chart.  She said that the test does have to be her walking, it can simply be her on oxygen and exerting herself by moving her arms and legs.  2. Also the dx we used, Hypoxemia requiring supplemental oxygen does not qualify her.  Minna AntisLorna searched her chart and found no dx that qualified her.  She is wondering if MD is concerned that pt has CHF since she is on lasix and sees a cardiologist because that would qualify her for home oxygen.  Minna AntisLorna said she would be happy to answer any calls that Dr. Parke SimmersBland may have.  Will forward to him and PCP. Fleeger, Maryjo RochesterJessica Dawn, CMA

## 2018-11-11 NOTE — Assessment & Plan Note (Signed)
NYHA class III.  Patient with 2015 echo showing 6065% ejection fraction and diastolic dysfunction grade 1.  Patient is currently being followed by cardiologist was not in our epic system, so unsure of further diagnostic exams.  Patient is encouraged to obtain records for us so that we can have most up-to-date information and they agree.  This is the likely cause of her hypoxemia with exertion she does not appear to have significant lung pathology at this time, will recommend repeat echo if she does not have a more recent one since 2015.

## 2018-11-11 NOTE — Telephone Encounter (Signed)
Pts son calls back.  They will come in today to repeat the home oxygen test.   Pt goes to Dr. Algie CofferKadakia.  I will call and get notes faxed over.  If a ROI is needed I will get pt to complete it this afternoon. [name] [name], Maryjo RochesterJessica Dawn, CMA

## 2018-11-11 NOTE — Progress Notes (Signed)
   Patient in to nurse clinic for oxygen testing.  O2 sat on room air is 96 % O2 sat ambulating around clinic dropped to a low of 88 %.  O2 applied via [place] at 2L and while ambulating O2 sat improved to 95%.  Ples SpecterAlisa Brake, RN [facility] RN)

## 2018-11-11 NOTE — Telephone Encounter (Signed)
Called Dr. Algie CofferKadakia office.  They will fax over notes to   Attn: Shanda BumpsJessica or Serina Cowperlisa  We will then place in Dr. Lora HavensBlands box. Maryjayne Kleven, Maryjo RochesterJessica Dawn, CMA

## 2018-11-11 NOTE — Progress Notes (Signed)
Addendum to original note:  Upon further chart review found echo from 2015 indicating diagnosis of CHF with grade 1 diastolic dysfunction, given patient's new and worsening symptoms this qualifies her as NYHA class III which is significant dysfunction now requiring home oxygen.  CHF (congestive heart failure), NYHA class III, chronic, diastolic (HCC) NYHA class III.  Patient with 2015 echo showing 6065% ejection fraction and diastolic dysfunction grade 1.  Patient is currently being followed by cardiologist was not in our epic system, so unsure of further diagnostic exams.  Patient is encouraged to obtain records for us so that we can have most up-to-date information and they agree.  This is the likely cause of her hypoxemia with exertion she does not appear to have significant lung pathology at this time, will recommend repeat echo if she does not have a more recent one since 2015.   -Dr. Parke SimmersBland

## 2018-11-11 NOTE — Addendum Note (Signed)
Addended by: Jone BasemanFLEEGER, Chamaine Stankus D on: 11/11/2018 03:20 PM   Modules accepted: Orders

## 2018-11-11 NOTE — Telephone Encounter (Signed)
Spoke with Dr. Parke SimmersBland.  The plan is:  1. Call pt / son and ask them to come back for a repeat of the home oxygen test.        - RA @ rest        - RA with exertion        - O2 with exertion  2. Ask who cardiologist is so we may call and get info.    I called and lmovm of son to call back. Berlene Dixson, Maryjo RochesterJessica Dawn, CMA

## 2018-11-12 NOTE — Telephone Encounter (Signed)
Pt has been approved for oxygen.    Minna AntisLorna nor myself have been able to reach son to make arrangements bring O2.  Will continue to call. Alcus Bradly, Maryjo RochesterJessica Dawn, CMA

## 2018-11-13 ENCOUNTER — Other Ambulatory Visit: Payer: Self-pay

## 2018-11-13 ENCOUNTER — Emergency Department (HOSPITAL_COMMUNITY)
Admission: EM | Admit: 2018-11-13 | Discharge: 2018-11-13 | Disposition: A | Discharge status: Home / Self Care | Payer: Medicare Other | Attending: Emergency Medicine | Admitting: Emergency Medicine

## 2018-11-13 ENCOUNTER — Encounter (HOSPITAL_COMMUNITY): Payer: Self-pay

## 2018-11-13 ENCOUNTER — Emergency Department (HOSPITAL_COMMUNITY): Payer: Medicare Other

## 2018-11-13 DIAGNOSIS — I110 Hypertensive heart disease with heart failure: Secondary | ICD-10-CM | POA: Diagnosis not present

## 2018-11-13 DIAGNOSIS — Z79899 Other long term (current) drug therapy: Secondary | ICD-10-CM | POA: Insufficient documentation

## 2018-11-13 DIAGNOSIS — I5042 Chronic combined systolic (congestive) and diastolic (congestive) heart failure: Secondary | ICD-10-CM | POA: Diagnosis not present

## 2018-11-13 DIAGNOSIS — E119 Type 2 diabetes mellitus without complications: Secondary | ICD-10-CM | POA: Diagnosis not present

## 2018-11-13 DIAGNOSIS — R251 Tremor, unspecified: Secondary | ICD-10-CM | POA: Diagnosis present

## 2018-11-13 LAB — COMPREHENSIVE METABOLIC PANEL
ALT: 27 U/L (ref 0–44)
AST: 26 U/L (ref 15–41)
Albumin: 3.7 g/dL (ref 3.5–5.0)
Alkaline Phosphatase: 72 U/L (ref 38–126)
Anion gap: 10 (ref 5–15)
BUN: 13 mg/dL (ref 8–23)
CO2: 26 mmol/L (ref 22–32)
Calcium: 8.4 mg/dL — ABNORMAL LOW (ref 8.9–10.3)
Chloride: 102 mmol/L (ref 98–111)
Creatinine, Ser: 0.78 mg/dL (ref 0.44–1.00)
GFR calc Af Amer: >60 mL/min (ref >60)
GFR calc non Af Amer: >60 mL/min (ref >60)
Glucose, Bld: 100 mg/dL — ABNORMAL HIGH (ref 70–99)
Potassium: 3.6 mmol/L (ref 3.5–5.1)
Sodium: 138 mmol/L (ref 135–145)
Total Bilirubin: 0.5 mg/dL (ref 0.3–1.2)
Total Protein: 6.8 g/dL (ref 6.5–8.1)

## 2018-11-13 LAB — CBC WITH DIFFERENTIAL/PLATELET
ABS IMMATURE GRANULOCYTES: 0.01 10*3/uL (ref 0.00–0.07)
Basophils Absolute: 0 10*3/uL (ref 0.0–0.1)
Basophils Relative: 1 %
Eosinophils Absolute: 0 10*3/uL (ref 0.0–0.5)
Eosinophils Relative: 0 %
HCT: 43.2 % (ref 36.0–46.0)
HEMOGLOBIN: 14.2 g/dL (ref 12.0–15.0)
Immature Granulocytes: 0 %
Lymphocytes Relative: 22 %
Lymphs Abs: 1.4 10*3/uL (ref 0.7–4.0)
MCH: 30.5 pg (ref 26.0–34.0)
MCHC: 32.9 g/dL (ref 30.0–36.0)
MCV: 92.7 fL (ref 80.0–100.0)
Monocytes Absolute: 0.6 10*3/uL (ref 0.1–1.0)
Monocytes Relative: 9 %
NEUTROS ABS: 4.4 10*3/uL (ref 1.7–7.7)
NEUTROS PCT: 68 %
Platelets: 97 10*3/uL — ABNORMAL LOW (ref 150–400)
RBC: 4.66 MIL/uL (ref 3.87–5.11)
RDW: 13.2 % (ref 11.5–15.5)
WBC: 6.5 10*3/uL (ref 4.0–10.5)
nRBC: 0 % (ref 0.0–0.2)

## 2018-11-13 NOTE — ED Notes (Signed)
Pt is alert and oriented x 4 and is verbally responsive. Pt reports that she has been having shaking x 2 days gross motor shakes are noted. Pt states that she feels as she is shaking inside. Pt escorted with son who states that she has been having increased weakness. Pt denies pain at this time.

## 2018-11-13 NOTE — Discharge Instructions (Addendum)
Follow-up with your family doctor in 1 to 2 weeks for reevaluation for the shaking inside

## 2018-11-13 NOTE — ED Triage Notes (Addendum)
Pt reports "shaking on the inside." She has a tremor at baseline, but she states that this feels different. Pt also endorses SOB with exertion and increased fatigue over the past week. She has an appointment with her cardiologist at the end of the month, A&Ox4. Declined interpreter.

## 2018-11-13 NOTE — Telephone Encounter (Signed)
Patient son aware. Ples SpecterAlisa Sidhant Helderman, RN [facility] RN)

## 2018-11-13 NOTE — ED Provider Notes (Signed)
[facility]-EMERGENCY DEPT Provider Note   CSN: [id number] Arrival date & time: 11/13/18  1803     History   Chief Complaint Chief Complaint  Patient presents with  . Shaking  . Fatigue    HPI [name] [name] is a 84 y.o. female.  Patient states that she feels like her chest and her abdomen are shaking on the inside.  Patient has a history of a tremor that is treated by neurology  The history is provided by the patient. No language interpreter was used.  Illness  This is a new problem. The current episode started more than 1 week ago. The problem occurs constantly. The problem has not changed since onset.Pertinent negatives include no chest pain, no abdominal pain and no headaches. Nothing aggravates the symptoms. Nothing relieves the symptoms. She has tried nothing for the symptoms. The treatment provided no relief.    Past Medical History:  Diagnosis Date  . Anxiety   . Diabetes (HCC) 10/27/2013  . DVT (deep venous thrombosis) (HCC)   . Essential tremor 10/27/2013  . High cholesterol 1999  . Hypertension   . Ischemic stroke (HCC) 07/18/2014  . Obesity, unspecified 10/27/2013  . Pneumonia   . Unspecified hereditary and idiopathic peripheral neuropathy 10/27/2013    Patient Active Problem List   Diagnosis Date Noted  . CHF (congestive heart failure), NYHA class III, chronic, diastolic (HCC) 11/11/2018  . Hypoxemia requiring supplemental oxygen 11/10/2018  . Fatigue 11/10/2018  . Osteopenia of multiple sites 11/10/2018  . Acute flank pain 08/07/2018  . Umbilical pain 10/31/2017  . Lumbar pain 10/10/2016  . Pelvic pain 10/10/2016  . Seizures (HCC) 08/06/2014  . Abnormal involuntary movement 08/05/2014  . Hemiplegia, unspecified, affecting dominant side 08/05/2014  . Right sided weakness 08/05/2014  . CVA (cerebral vascular accident) (HCC) 07/21/2014  . Left leg pain 07/20/2014  . HTN (hypertension) 07/18/2014  . HLD (hyperlipidemia) 07/18/2014   . Thrombocytopenia (HCC) 07/18/2014  . Essential tremor 10/27/2013  . Obesity, unspecified 10/27/2013  . DM2 (diabetes mellitus, type 2) (HCC) 10/27/2013  . History of peripheral neuropathy 10/27/2013    Past Surgical History:  Procedure Laterality Date  . CATARACT EXTRACTION Bilateral   . FOOT SURGERY       OB History   No obstetric history on file.      Home Medications    Prior to Admission medications   Medication Sig Start Date End Date Taking? Authorizing Provider  acetaminophen (TYLENOL) 325 MG tablet Take 2 tablets (650 mg total) by mouth every 6 (six) hours as needed. 06/01/17  Yes Nanavati, Ankit, MD  amLODipine (NORVASC) 2.5 MG tablet Take 2.5 mg by mouth daily. 05/29/17  Yes [provider]  atorvastatin (LIPITOR) 20 MG tablet Take 1 tablet (20 mg total) by mouth every evening. 06/22/18  Yes Garth Bignessimberlake, Kathryn, MD  CALCIUM PO Take 1 tablet by mouth daily.   Yes [provider]  Cholecalciferol (VITAMIN D PO) Take 1 tablet by mouth daily.   Yes [provider]  clopidogrel (PLAVIX) 75 MG tablet TAKE 1 TABLET (75 MG TOTAL) BY MOUTH DAILY. 05/11/18  Yes Huston FoleyAthar, Saima, MD  DULoxetine (CYMBALTA) 20 MG capsule TAKE 1 CAPSULE BY MOUTH EVERY DAY 06/04/18  Yes Kerrin ChampagneNitka, James E, MD  furosemide (LASIX) 20 MG tablet Take 20 mg by mouth daily. 06/08/18  Yes [provider]  gabapentin (NEURONTIN) 100 MG capsule Take 1 capsule (100 mg total) by mouth at bedtime. 09/07/18  Yes Huston FoleyAthar, Saima,  MD  levETIRAcetam (KEPPRA) 500 MG tablet Take 1 tablet (500 mg total) by mouth 2 (two) times daily. 09/07/18  Yes Huston Foley, MD  Omega-3 Fatty Acids (FISH OIL) 1000 MG CAPS Take 1 capsule by mouth daily.    Yes [provider]  PAZEO 0.7 % SOLN Place 1 drop into both eyes at bedtime.  07/24/18  Yes [provider]  potassium chloride SA (K-DUR,KLOR-CON) 20 MEQ tablet Take 20 mEq by mouth daily.    Yes [provider]  primidone (MYSOLINE)  250 MG tablet Take 1 tablet (250 mg total) by mouth 2 (two) times daily. 09/07/18  Yes Huston Foley, MD  RESTASIS 0.05 % ophthalmic emulsion Place 1 drop into both eyes 2 (two) times daily.  09/29/14  Yes [provider]  VOLTAREN 1 % GEL APPLY 2 G TOPICALLY 4 (FOUR) TIMES DAILY. 07/21/18  Yes Garth Bigness, MD  ALPRAZolam Prudy Feeler) 0.25 MG tablet Take 0.25 mg by mouth 2 (two) times daily as needed for anxiety.     [provider]  glucose blood (ACCU-CHEK AVIVA) test strip Use as instructed to test once a day Dx code:E11.9 11/11/18   Carney Living, MD    Family History Family History  Problem Relation Age of Onset  . Stroke Mother   . Heart Problems Brother   . Coronary artery disease Other   . Heart Problems Sister     Social History Social History   Tobacco Use  . Smoking status: Never Smoker  . Smokeless tobacco: Never Used  Substance Use Topics  . Alcohol use: No    Alcohol/week: 0.0 standard drinks  . Drug use: No     Allergies   Tape   Review of Systems Review of Systems  Constitutional: Negative for appetite change and fatigue.  HENT: Negative for congestion, ear discharge and sinus pressure.   Eyes: Negative for discharge.  Respiratory: Negative for cough.   Cardiovascular: Negative for chest pain.  Gastrointestinal: Negative for abdominal pain and diarrhea.  Genitourinary: Negative for frequency and hematuria.  Musculoskeletal: Negative for back pain.  Skin: Negative for rash.  Neurological: Negative for seizures and headaches.  Psychiatric/Behavioral: Negative for hallucinations.     Physical Exam Updated Vital Signs BP 138/64 (BP Location: Right Arm)   Pulse 74   Temp 98.6 F (37 C) (Oral)   Resp 16   SpO2 95%   Physical Exam Constitutional:      Appearance: She is well-developed.  HENT:     Head: Normocephalic.     Nose: Nose normal.  Eyes:     General: No scleral icterus.    Conjunctiva/sclera: Conjunctivae  normal.  Neck:     Musculoskeletal: Neck supple.     Thyroid: No thyromegaly.  Cardiovascular:     Rate and Rhythm: Normal rate and regular rhythm.     Heart sounds: No murmur. No friction rub. No gallop.   Pulmonary:     Breath sounds: No stridor. No wheezing or rales.  Chest:     Chest wall: No tenderness.  Abdominal:     General: There is no distension.     Tenderness: There is no abdominal tenderness. There is no rebound.  Musculoskeletal: Normal range of motion.  Lymphadenopathy:     Cervical: No cervical adenopathy.  Skin:    Findings: No erythema or rash.  Neurological:     Mental Status: She is oriented to person, place, and time.     Motor: No abnormal muscle  tone.     Coordination: Coordination normal.     Comments: Tremor of her head  Psychiatric:        Behavior: Behavior normal.      ED Treatments / Results  Labs (all labs ordered are listed, but only abnormal results are displayed) Labs Reviewed  CBC WITH DIFFERENTIAL/PLATELET - Abnormal; Notable for the following components:      Result Value   Platelets 97 (*)    All other components within normal limits  COMPREHENSIVE METABOLIC PANEL - Abnormal; Notable for the following components:   Glucose, Bld 100 (*)    Calcium 8.4 (*)    All other components within normal limits    EKG EKG Interpretation  Date/Time:  Friday November 13 2018 19:07:28 EST Ventricular Rate:  75 PR Interval:    QRS Duration: 79 QT Interval:  368 QTC Calculation: 411 R Axis:   -7 Text Interpretation:  Sinus rhythm Confirmed by Bethann BerkshireZammit, Bobbi Kozakiewicz [phone]([id number]) on 11/13/2018 9:16:34 PM   Radiology Dg Abd Acute W/chest  Result Date: 11/13/2018 CLINICAL DATA:  Weakness. Shortness of breath. Checking and tremors. EXAM: DG ABDOMEN ACUTE W/ 1V CHEST COMPARISON:  Lumbar radiograph 05/11/2018 FINDINGS: The heart is enlarged. Atherosclerotic calcifications are present at the aorta. Small effusions are present with associated atelectasis. The  lungs are otherwise clear. Bowel gas pattern is normal. There is no obstruction or free air. Multilevel degenerative changes are noted in the lower thoracic and lumbar spine. IMPRESSION: 1. Cardiomegaly without failure. 2. Small bilateral pleural effusions and associated atelectasis. 3. Aortic atherosclerosis. 4. Normal bowel gas pattern. 5. Multilevel degenerative changes in the lumbar spine. Electronically Signed   By: Marin Robertshristopher  Mattern M.D.   On: 11/13/2018 19:42    Procedures Procedures (including critical care time)  Medications Ordered in ED Medications - No data to display   Initial Impression / Assessment and Plan / ED Course  I have reviewed the triage vital signs and the nursing notes.  Pertinent labs & imaging results that were available during my care of the patient were reviewed by me and considered in my medical decision making (see chart for details).     Is unremarkable.  X-ray unremarkable.  Patient will be following up with her PCP for her feeling that everything is shaking inside her  Final Clinical Impressions(s) / ED Diagnoses   Final diagnoses:  Tremor    ED Discharge Orders    None       Bethann BerkshireZammit, Moxie Kalil, MD 11/13/18 2128

## 2018-11-16 ENCOUNTER — Telehealth: Payer: Self-pay

## 2018-11-16 ENCOUNTER — Other Ambulatory Visit: Payer: Self-pay | Admitting: Neurology

## 2018-11-16 DIAGNOSIS — E785 Hyperlipidemia, unspecified: Secondary | ICD-10-CM

## 2018-11-16 DIAGNOSIS — R531 Weakness: Secondary | ICD-10-CM

## 2018-11-16 NOTE — Telephone Encounter (Signed)
[name] [name] called nurse line stating that the order needs to be signed and dated by MD, dating to reflect 12/20, and faxed to [phone].  [name] [name] also stated they went to pts home over the weekend to set up the oxygen and were sent away due to pt and son being "too busy." [name] [name] is going back out today.

## 2018-11-16 NOTE — Telephone Encounter (Signed)
Error

## 2018-12-03 ENCOUNTER — Telehealth (INDEPENDENT_AMBULATORY_CARE_PROVIDER_SITE_OTHER): Payer: Self-pay | Admitting: Specialist

## 2018-12-03 NOTE — Telephone Encounter (Signed)
Patient called asked what was the Rx (Duloxetine) 20 mg. Patient asked if the Rx is for pain. Patient said he had a bone density test a month ago and would like the results The number to contact patient is [phone]

## 2018-12-07 ENCOUNTER — Other Ambulatory Visit: Payer: Self-pay | Admitting: Neurology

## 2018-12-07 DIAGNOSIS — E0842 Diabetes mellitus due to underlying condition with diabetic polyneuropathy: Secondary | ICD-10-CM

## 2018-12-08 NOTE — Telephone Encounter (Signed)
Patient's son came into the clinic inquiring about his message from the other day.  He had some additional questions in regards to the medication.  He wanted to know if the medication dosage can be increased or does she need to come back in to see Dr. Otelia Sergeant.  He would like for you to call him.  CB#[phone].  Thank you

## 2018-12-09 ENCOUNTER — Telehealth: Payer: Self-pay | Admitting: Neurology

## 2018-12-09 NOTE — Telephone Encounter (Signed)
I called [facility], spent 10 minutes on hold. Spoke with Morrie Sheldon, she verified that they do have the RX for gabapentin from October of 2019. They are working on getting it ready and it should be ready soon.  I called pt's son, per DPR, and advised him of this. I asked him to call his pharmacy for refills. Pt's son verbalized understanding.

## 2018-12-09 NOTE — Telephone Encounter (Signed)
Pt is needing a refill for her gabapentin (NEURONTIN) 100 MG capsule 3 months supply send [facility] in [place]

## 2018-12-09 NOTE — Telephone Encounter (Signed)
sched for 12/15/18 @ 330

## 2018-12-11 ENCOUNTER — Ambulatory Visit
Admission: RE | Admit: 2018-12-11 | Discharge: 2018-12-11 | Disposition: A | Discharge status: Home / Self Care | Payer: Medicare Other | Source: Ambulatory Visit | Attending: Internal Medicine | Admitting: Internal Medicine

## 2018-12-11 DIAGNOSIS — Z1231 Encounter for screening mammogram for malignant neoplasm of breast: Secondary | ICD-10-CM

## 2018-12-14 ENCOUNTER — Other Ambulatory Visit (INDEPENDENT_AMBULATORY_CARE_PROVIDER_SITE_OTHER): Payer: Self-pay | Admitting: Specialist

## 2018-12-14 NOTE — Telephone Encounter (Signed)
DULoxetine  Refill request--has appt sched for 12/15/18

## 2018-12-15 ENCOUNTER — Ambulatory Visit (INDEPENDENT_AMBULATORY_CARE_PROVIDER_SITE_OTHER): Payer: Self-pay

## 2018-12-15 ENCOUNTER — Ambulatory Visit (INDEPENDENT_AMBULATORY_CARE_PROVIDER_SITE_OTHER): Payer: Medicare Other | Admitting: Specialist

## 2018-12-15 ENCOUNTER — Ambulatory Visit (INDEPENDENT_AMBULATORY_CARE_PROVIDER_SITE_OTHER): Payer: Medicare Other

## 2018-12-15 ENCOUNTER — Encounter (INDEPENDENT_AMBULATORY_CARE_PROVIDER_SITE_OTHER): Payer: Self-pay | Admitting: Specialist

## 2018-12-15 VITALS — BP 141/87 | HR 77 | Ht 60.0 in | Wt 165.0 lb

## 2018-12-15 DIAGNOSIS — G8929 Other chronic pain: Secondary | ICD-10-CM

## 2018-12-15 DIAGNOSIS — W19XXXD Unspecified fall, subsequent encounter: Secondary | ICD-10-CM

## 2018-12-15 DIAGNOSIS — M5442 Lumbago with sciatica, left side: Secondary | ICD-10-CM | POA: Diagnosis not present

## 2018-12-15 DIAGNOSIS — M25552 Pain in left hip: Secondary | ICD-10-CM | POA: Diagnosis not present

## 2018-12-15 MED ORDER — TRAMADOL-ACETAMINOPHEN 37.5-325 MG PO TABS: 1.0000 | ORAL_TABLET | ORAL | 0 refills | Status: DC | PRN

## 2018-12-15 NOTE — Patient Instructions (Addendum)
The radiographs are suspicious for a possible nondisplaced hairline fracture of the rim of the left hip Socket. I recommend a CT scan of the pelvis to assess for any displacement. Tramadol for pain ultracet one every 4-6 hours for pain. Okay to stop gabapentin at night to see if some of the tremors improve. Continue using the walker for balance and to decrease the stress on the left hip. Call for any worsening of hip pain for inability to weight bear on the left hip. Fall Prevention and Home Safety Falls cause injuries and can affect all age groups. It is possible to use preventive measures to significantly decrease the likelihood of falls. There are many simple measures which can make your home safer and prevent falls. OUTDOORS  Repair cracks and edges of walkways and driveways.  Remove high doorway thresholds.  Trim shrubbery on the main path into your home.  Have good outside lighting.  Clear walkways of tools, rocks, debris, and clutter.  Check that handrails are not broken and are securely fastened. Both sides of steps should have handrails.  Have leaves, snow, and ice cleared regularly.  Use sand or salt on walkways during winter months.  In the garage, clean up grease or oil spills. BATHROOM  Install night lights.  Install grab bars by the toilet and in the tub and shower.  Use non-skid mats or decals in the tub or shower.  Place a plastic non-slip stool in the shower to sit on, if needed.  Keep floors dry and clean up all water on the floor immediately.  Remove soap buildup in the tub or shower on a regular basis.  Secure bath mats with non-slip, double-sided rug tape.  Remove throw rugs and tripping hazards from the floors. BEDROOMS  Install night lights.  Make sure a bedside light is easy to reach.  Do not use oversized bedding.  Keep a telephone by your bedside.  Have a firm chair with side arms to use for getting dressed.  Remove throw rugs and  tripping hazards from the floor. KITCHEN  Keep handles on pots and pans turned toward the center of the stove. Use back burners when possible.  Clean up spills quickly and allow time for drying.  Avoid walking on wet floors.  Avoid hot utensils and knives.  Position shelves so they are not too high or low.  Place commonly used objects within easy reach.  If necessary, use a sturdy step stool with a grab bar when reaching.  Keep electrical cables out of the way.  Do not use floor polish or wax that makes floors slippery. If you must use wax, use non-skid floor wax.  Remove throw rugs and tripping hazards from the floor. STAIRWAYS  Never leave objects on stairs.  Place handrails on both sides of stairways and use them. Fix any loose handrails. Make sure handrails on both sides of the stairways are as long as the stairs.  Check carpeting to make sure it is firmly attached along stairs. Make repairs to worn or loose carpet promptly.  Avoid placing throw rugs at the top or bottom of stairways, or properly secure the rug with carpet tape to prevent slippage. Get rid of throw rugs, if possible.  Have an electrician put in a light switch at the top and bottom of the stairs. OTHER FALL PREVENTION TIPS  Wear low-heel or rubber-soled shoes that are supportive and fit well. Wear closed toe shoes.  When using a stepladder, make sure it is  fully opened and both spreaders are firmly locked. Do not climb a closed stepladder.  Add color or contrast paint or tape to grab bars and handrails in your home. Place contrasting color strips on first and last steps.  Learn and use mobility aids as needed. Install an electrical emergency response system.  Turn on lights to avoid dark areas. Replace light bulbs that burn out immediately. Get light switches that glow.  Arrange furniture to create clear pathways. Keep furniture in the same place.  Firmly attach carpet with non-skid or double-sided  tape.  Eliminate uneven floor surfaces.  Select a carpet pattern that does not visually hide the edge of steps.  Be aware of all pets. OTHER HOME SAFETY TIPS  Set the water temperature for 120 F (48.8 C).  Keep emergency numbers on or near the telephone.  Keep smoke detectors on every level of the home and near sleeping areas. Document Released: 11/01/2002 Document Revised: 05/12/2012 Document Reviewed: 01/31/2012 [facility] Patient Information 2014 [place], [place].

## 2018-12-15 NOTE — Progress Notes (Signed)
Office Visit Note   Patient: [name] [name]           Date of Birth: 1930-01-07           MRN: [id number] Visit Date: 12/15/2018              Requested by: Garth Bigness, MD [address] [place], [place] [address] PCP: Garth Bigness, MD   Assessment & Plan: Visit Diagnoses:  1. Chronic left-sided low back pain with left-sided sciatica   2. Pain of left hip joint   3. Fall, subsequent encounter     Plan: The radiographs are suspicious for a possible nondisplaced hairline fracture of the rim of the left hip Socket. I recommend a CT scan of the pelvis to assess for any displacement. Tramadol for pain ultracet one every 4-6 hours for pain. Okay to stop gabapentin at night to see if some of the tremors improve. Continue using the walker for balance and to decrease the stress on the left hip. Call for any worsening of hip pain for inability to weight bear on the left hip. Fall Prevention and Home Safety Falls cause injuries and can affect all age groups. It is possible to use preventive measures to significantly decrease the likelihood of falls. There are many simple measures which can make your home safer and prevent falls. OUTDOORS  Repair cracks and edges of walkways and driveways.  Remove high doorway thresholds.  Trim shrubbery on the main path into your home.  Have good outside lighting.  Clear walkways of tools, rocks, debris, and clutter.  Check that handrails are not broken and are securely fastened. Both sides of steps should have handrails.  Have leaves, snow, and ice cleared regularly.  Use sand or salt on walkways during winter months.  In the garage, clean up grease or oil spills. BATHROOM  Install night lights.  Install grab bars by the toilet and in the tub and shower.  Use non-skid mats or decals in the tub or shower.  Place a plastic non-slip stool in the shower to sit on, if needed.  Keep floors dry and clean up all water on the  floor immediately.  Remove soap buildup in the tub or shower on a regular basis.  Secure bath mats with non-slip, double-sided rug tape.  Remove throw rugs and tripping hazards from the floors. BEDROOMS  Install night lights.  Make sure a bedside light is easy to reach.  Do not use oversized bedding.  Keep a telephone by your bedside.  Have a firm chair with side arms to use for getting dressed.  Remove throw rugs and tripping hazards from the floor. KITCHEN  Keep handles on pots and pans turned toward the center of the stove. Use back burners when possible.  Clean up spills quickly and allow time for drying.  Avoid walking on wet floors.  Avoid hot utensils and knives.  Position shelves so they are not too high or low.  Place commonly used objects within easy reach.  If necessary, use a sturdy step stool with a grab bar when reaching.  Keep electrical cables out of the way.  Do not use floor polish or wax that makes floors slippery. If you must use wax, use non-skid floor wax.  Remove throw rugs and tripping hazards from the floor. STAIRWAYS  Never leave objects on stairs.  Place handrails on both sides of stairways and use them. Fix any loose handrails. Make sure handrails on both sides of  the stairways are as long as the stairs.  Check carpeting to make sure it is firmly attached along stairs. Make repairs to worn or loose carpet promptly.  Avoid placing throw rugs at the top or bottom of stairways, or properly secure the rug with carpet tape to prevent slippage. Get rid of throw rugs, if possible.  Have an electrician put in a light switch at the top and bottom of the stairs. OTHER FALL PREVENTION TIPS  Wear low-heel or rubber-soled shoes that are supportive and fit well. Wear closed toe shoes.  When using a stepladder, make sure it is fully opened and both spreaders are firmly locked. Do not climb a closed stepladder.  Add color or contrast paint or tape  to grab bars and handrails in your home. Place contrasting color strips on first and last steps.  Learn and use mobility aids as needed. Install an electrical emergency response system.  Turn on lights to avoid dark areas. Replace light bulbs that burn out immediately. Get light switches that glow.  Arrange furniture to create clear pathways. Keep furniture in the same place.  Firmly attach carpet with non-skid or double-sided tape.  Eliminate uneven floor surfaces.  Select a carpet pattern that does not visually hide the edge of steps.  Be aware of all pets. OTHER HOME SAFETY TIPS  Set the water temperature for 120 F (48.8 C).  Keep emergency numbers on or near the telephone.  Keep smoke detectors on every level of the home and near sleeping areas. Document Released: 11/01/2002 Document Revised: 05/12/2012 Document Reviewed: 01/31/2012 [facility] Patient Information 2014 [place], [place]. Follow-Up Instructions: Return in about 2 weeks (around 12/29/2018).   Orders:  Orders Placed This Encounter  Procedures  . XR Lumbar Spine 2-3 Views  . XR HIP UNILAT W OR W/O PELVIS 2-3 VIEWS LEFT   No orders of the defined types were placed in this encounter.     Procedures: No procedures performed   Clinical Data: No additional findings.   Subjective: Chief Complaint  Patient presents with  . Lower Back - Follow-up    HPI  Review of Systems   Objective: Vital Signs: BP (!) 141/87 (BP Location: Left Arm, Patient Position: Sitting)   Pulse 77   Ht 5' (1.524 m)   Wt 165 lb (74.8 kg)   BMI 32.22 kg/m   Physical Exam  Ortho Exam  Specialty Comments:  No specialty comments available.  Imaging: Xr Hip Unilat W Or W/o Pelvis 2-3 Views Left  Result Date: 12/15/2018 AP  Pelvis and lateral left hip with lucency left acetabulum, faint nondisplaced overlying the left femoral head and neck. CT scan will better assess for acetabular injury including if there is  displacement. The left hip joint is well maintained.     PMFS History: Patient Active Problem List   Diagnosis Date Noted  . CHF (congestive heart failure), NYHA class III, chronic, diastolic (HCC) 11/11/2018  . Hypoxemia requiring supplemental oxygen 11/10/2018  . Fatigue 11/10/2018  . Osteopenia of multiple sites 11/10/2018  . Acute flank pain 08/07/2018  . Umbilical pain 10/31/2017  . Lumbar pain 10/10/2016  . Pelvic pain 10/10/2016  . Seizures (HCC) 08/06/2014  . Abnormal involuntary movement 08/05/2014  . Hemiplegia, unspecified, affecting dominant side 08/05/2014  . Right sided weakness 08/05/2014  . CVA (cerebral vascular accident) (HCC) 07/21/2014  . Left leg pain 07/20/2014  . HTN (hypertension) 07/18/2014  . HLD (hyperlipidemia) 07/18/2014  . Thrombocytopenia (HCC) 07/18/2014  . Essential tremor  10/27/2013  . Obesity, unspecified 10/27/2013  . DM2 (diabetes mellitus, type 2) (HCC) 10/27/2013  . History of peripheral neuropathy 10/27/2013   Past Medical History:  Diagnosis Date  . Anxiety   . Diabetes (HCC) 10/27/2013  . DVT (deep venous thrombosis) (HCC)   . Essential tremor 10/27/2013  . High cholesterol 1999  . Hypertension   . Ischemic stroke (HCC) 07/18/2014  . Obesity, unspecified 10/27/2013  . Pneumonia   . Unspecified hereditary and idiopathic peripheral neuropathy 10/27/2013    Family History  Problem Relation Age of Onset  . Stroke Mother   . Heart Problems Brother   . Coronary artery disease Other   . Heart Problems Sister     Past Surgical History:  Procedure Laterality Date  . CATARACT EXTRACTION Bilateral   . FOOT SURGERY     Social History   Occupational History    Comment: does not work  Tobacco Use  . Smoking status: Never Smoker  . Smokeless tobacco: Never Used  Substance and Sexual Activity  . Alcohol use: No    Alcohol/week: 0.0 standard drinks  . Drug use: No  . Sexual activity: Never

## 2018-12-17 ENCOUNTER — Telehealth: Payer: Self-pay

## 2018-12-17 ENCOUNTER — Encounter: Payer: Self-pay | Admitting: Family Medicine

## 2018-12-17 ENCOUNTER — Other Ambulatory Visit: Payer: Self-pay

## 2018-12-17 ENCOUNTER — Ambulatory Visit (INDEPENDENT_AMBULATORY_CARE_PROVIDER_SITE_OTHER): Payer: Medicare Other | Admitting: Family Medicine

## 2018-12-17 VITALS — BP 116/58 | HR 89 | Temp 97.8°F | Ht 60.0 in | Wt 153.8 lb

## 2018-12-17 DIAGNOSIS — E119 Type 2 diabetes mellitus without complications: Secondary | ICD-10-CM

## 2018-12-17 DIAGNOSIS — L304 Erythema intertrigo: Secondary | ICD-10-CM | POA: Diagnosis not present

## 2018-12-17 DIAGNOSIS — I10 Essential (primary) hypertension: Secondary | ICD-10-CM | POA: Diagnosis not present

## 2018-12-17 LAB — POCT GLYCOSYLATED HEMOGLOBIN (HGB A1C): HbA1c, POC (controlled diabetic range): 5.3 % (ref 0.0–7.0)

## 2018-12-17 MED ORDER — NYSTATIN 100000 UNIT/GM EX POWD: Freq: Four times a day (QID) | CUTANEOUS | 0 refills | Status: DC

## 2018-12-17 MED ORDER — LIDOCAINE 5 % EX PTCH: 1.0000 | MEDICATED_PATCH | CUTANEOUS | 0 refills | Status: DC

## 2018-12-17 NOTE — Telephone Encounter (Signed)
Received fax from pharmacy, PA needed on Lidocaine patch. Clinical questions submitted via Cover My Meds. Waiting on response, could take up to 72 hours.  Cover My Meds info: Key: [id number]    Ples Specter, RN [facility] RN)

## 2018-12-17 NOTE — Progress Notes (Signed)
   CC: diabetes, rash  HPI  Hypoxia - still gets SOB on exertion. Haven't checked O2 sat after walking, 95 at rest. Occasionally wearing O2 at home? Saw Dr. Algie Coffer, he didn't feel she was in CHF exacerbation per son's report.   Rash - under bilateral breasts, itchy. Has tried baby powder. No help. Present for several weeks, really bothersome to her.   DM - not checking CBGs, no meds. No concerns.   Back pain - still present, seeing other. Years ago a lidocaine patch worked for her, wonders if she can try this again.   ROS: Denies CP, SOB, abdominal pain, dysuria, changes in BMs.   CC, SH/smoking status, and VS noted  Objective: BP (!) 116/58   Pulse 89   Temp 97.8 F (36.6 C) (Oral)   Ht 5' (1.524 m)   Wt 153 lb 12.8 oz (69.8 kg)   SpO2 94%   BMI 30.04 kg/m  Gen: NAD, alert, cooperative, and pleasant. HEENT: NCAT, EOMI, PERRL CV: RRR, no murmur Resp: CTAB, no wheezes, non-labored Abd: SNTND, BS present, no guarding or organomegaly Ext: No edema, warm Skin: shiny erythematous rash under bilateral breasts.  Neuro: Alert and oriented, Speech clear, No gross deficits  Assessment and plan:  1. Type 2 diabetes mellitus without complication, unspecified whether long term insulin use (HCC) Stable, no medications. May consider yearly a1cs? - HgB A1c  2. Essential hypertension Continue current regimen.   3. Intertrigo Nystatin powder, use corn starch for drying after resolution. Wash garments in hot water.   4. Back pain - likely OA, no red flags, happy to try lidocaine patch while ortho is working this up. They report imaging study soon.   Hypoxia - this is curious to me. She doesn't seem fluid overloaded on QOD lasix 20mg . Not sure about the O2 use at home if sats are ok. Would consider chest imaging if recurs or worsening.    Orders Placed This Encounter  Procedures  . HgB A1c    Loni Muse, MD, PGY3 12/17/2018 7:50 PM

## 2018-12-17 NOTE — Telephone Encounter (Signed)
Lidocaine patch approved. [facility] notified. Ples Specter, RN [facility] RN)

## 2018-12-17 NOTE — Patient Instructions (Signed)
It was a pleasure to see you today! Thank you for choosing [facility] for your primary care. Megha B Mula was seen for blood sugar, rash, back pain.   Our plans for today were:  Your blood sugar number is excellent. No changes.   Your rash is a yeast infection, please use the powder I sent and then add just cornstarch once you finish the prescription. Wash your undergarments in hot water.   Try the patch for your back.    Best,  Dr. Chanetta Marshall

## 2018-12-21 ENCOUNTER — Ambulatory Visit
Admission: RE | Admit: 2018-12-21 | Discharge: 2018-12-21 | Disposition: A | Discharge status: Home / Self Care | Payer: Medicare Other | Source: Ambulatory Visit | Attending: Specialist | Admitting: Specialist

## 2018-12-21 DIAGNOSIS — G8929 Other chronic pain: Secondary | ICD-10-CM

## 2018-12-21 DIAGNOSIS — M25552 Pain in left hip: Secondary | ICD-10-CM

## 2018-12-21 DIAGNOSIS — W19XXXD Unspecified fall, subsequent encounter: Secondary | ICD-10-CM

## 2018-12-21 DIAGNOSIS — M5442 Lumbago with sciatica, left side: Principal | ICD-10-CM

## 2018-12-29 ENCOUNTER — Encounter: Payer: Self-pay | Admitting: Family Medicine

## 2018-12-29 ENCOUNTER — Other Ambulatory Visit: Payer: Self-pay | Admitting: Family Medicine

## 2018-12-31 ENCOUNTER — Ambulatory Visit (INDEPENDENT_AMBULATORY_CARE_PROVIDER_SITE_OTHER): Payer: Medicare Other | Admitting: Specialist

## 2018-12-31 ENCOUNTER — Encounter (INDEPENDENT_AMBULATORY_CARE_PROVIDER_SITE_OTHER): Payer: Self-pay | Admitting: Specialist

## 2018-12-31 VITALS — BP 154/72 | HR 74 | Ht 60.0 in | Wt 153.0 lb

## 2018-12-31 DIAGNOSIS — S32040G Wedge compression fracture of fourth lumbar vertebra, subsequent encounter for fracture with delayed healing: Secondary | ICD-10-CM | POA: Diagnosis not present

## 2018-12-31 DIAGNOSIS — M810 Age-related osteoporosis without current pathological fracture: Secondary | ICD-10-CM

## 2018-12-31 DIAGNOSIS — M8580 Other specified disorders of bone density and structure, unspecified site: Secondary | ICD-10-CM

## 2018-12-31 DIAGNOSIS — Z780 Asymptomatic menopausal state: Secondary | ICD-10-CM

## 2018-12-31 MED ORDER — TRAMADOL-ACETAMINOPHEN 37.5-325 MG PO TABS: 2.0000 | ORAL_TABLET | ORAL | 0 refills | Status: DC | PRN

## 2018-12-31 NOTE — Progress Notes (Signed)
Office Visit Note   Patient: [name] [name]           Date of Birth: 03/23/1930           MRN: [id number] Visit Date: 12/31/2018              Requested by: Garth Bigness, MD [address] [place], [place] [address] PCP: Garth Bigness, MD   Assessment & Plan: Visit Diagnoses:  1. Osteopenia after menopause   2. Closed wedge compression fracture of fourth lumbar vertebra with delayed healing, subsequent encounter     Plan: Fall Prevention and Home Safety Falls cause injuries and can affect all age groups. It is possible to use preventive measures to significantly decrease the likelihood of falls. There are many simple measures which can make your home safer and prevent falls. OUTDOORS  Repair cracks and edges of walkways and driveways.  Remove high doorway thresholds.  Trim shrubbery on the main path into your home.  Have good outside lighting.  Clear walkways of tools, rocks, debris, and clutter.  Check that handrails are not broken and are securely fastened. Both sides of steps should have handrails.  Have leaves, snow, and ice cleared regularly.  Use sand or salt on walkways during winter months.  In the garage, clean up grease or oil spills. BATHROOM  Install night lights.  Install grab bars by the toilet and in the tub and shower.  Use non-skid mats or decals in the tub or shower.  Place a plastic non-slip stool in the shower to sit on, if needed.  Keep floors dry and clean up all water on the floor immediately.  Remove soap buildup in the tub or shower on a regular basis.  Secure bath mats with non-slip, double-sided rug tape.  Remove throw rugs and tripping hazards from the floors. BEDROOMS  Install night lights.  Make sure a bedside light is easy to reach.  Do not use oversized bedding.  Keep a telephone by your bedside.  Have a firm chair with side arms to use for getting dressed.  Remove throw rugs and tripping hazards from  the floor. KITCHEN  Keep handles on pots and pans turned toward the center of the stove. Use back burners when possible.  Clean up spills quickly and allow time for drying.  Avoid walking on wet floors.  Avoid hot utensils and knives.  Position shelves so they are not too high or low.  Place commonly used objects within easy reach.  If necessary, use a sturdy step stool with a grab bar when reaching.  Keep electrical cables out of the way.  Do not use floor polish or wax that makes floors slippery. If you must use wax, use non-skid floor wax.  Remove throw rugs and tripping hazards from the floor. STAIRWAYS  Never leave objects on stairs.  Place handrails on both sides of stairways and use them. Fix any loose handrails. Make sure handrails on both sides of the stairways are as long as the stairs.  Check carpeting to make sure it is firmly attached along stairs. Make repairs to worn or loose carpet promptly.  Avoid placing throw rugs at the top or bottom of stairways, or properly secure the rug with carpet tape to prevent slippage. Get rid of throw rugs, if possible.  Have an electrician put in a light switch at the top and bottom of the stairs. OTHER FALL PREVENTION TIPS  Wear low-heel or rubber-soled shoes that are supportive and  fit well. Wear closed toe shoes.  When using a stepladder, make sure it is fully opened and both spreaders are firmly locked. Do not climb a closed stepladder.  Add color or contrast paint or tape to grab bars and handrails in your home. Place contrasting color strips on first and last steps.  Learn and use mobility aids as needed. Install an electrical emergency response system.  Turn on lights to avoid dark areas. Replace light bulbs that burn out immediately. Get light switches that glow.  Arrange furniture to create clear pathways. Keep furniture in the same place.  Firmly attach carpet with non-skid or double-sided tape.  Eliminate  uneven floor surfaces.  Select a carpet pattern that does not visually hide the edge of steps.  Be aware of all pets. OTHER HOME SAFETY TIPS  Set the water temperature for 120 F (48.8 C).  Keep emergency numbers on or near the telephone.  Keep smoke detectors on every level of the home and near sleeping areas. Document Released: 11/01/2002 Document Revised: 05/12/2012 Document Reviewed: 01/31/2012 [facility] Patient Information 2014 [place], [place]. Avoid frequent bending and stooping  No lifting greater than 10 lbs. May use ice or moist heat for pain. Weight loss is of benefit. Best medication for lumbar compression fracture is acute pain medication but we are only allowed to prescribe a week at a time.  Exercise is important to improve your indurance and does allow people to function better inspite of back pain. A consult with interventional radiology to consider an L4 kyphovertebroplasty.   Follow-Up Instructions: Return in about 4 weeks (around 01/28/2019).   Orders:  Orders Placed This Encounter  Procedures  . Ambulatory referral to Interventional Radiology   No orders of the defined types were placed in this encounter.     Procedures: No procedures performed   Clinical Data: Findings:  Study Result   CLINICAL DATA:  Pelvic and left hip pain since a fall several months ago. Initial encounter.  EXAM: CT OF THE BILATERAL HIPS WITHOUT CONTRAST  TECHNIQUE: Multidetector CT imaging of the bilateral hips was performed according to the standard protocol. Multiplanar CT image reconstructions were also generated.  COMPARISON:  [facility]ain [facility]15/2018.  FINDINGS: Bones/Joint/Cartilage  The patient has a superior endplate compression fracture with vertebral body height loss centrally of approximately 25%. Fracture margins are sharp compatible with acute or subacute injury. There is minimal bony retropulsion  off the superior endplate. No involvement of the posterior elements is seen. No other fracture is identified. Facet degenerative disease at L4-5 results in 0.6 cm anterolisthesis.  Both hips are located. Mild bilateral joint space narrowing is present and there is some chondrocalcinosis about the hips. Osteophytosis about the symphysis pubis is noted. Vacuum disc phenomenon and facet arthropathy are seen at L3-4, L4-5 and L5-S1.  Ligaments  Suboptimally assessed by CT.  Muscles and Tendons  Intact and normal appearance.  Soft tissues  Imaged intrapelvic contents demonstrate atherosclerotic vascular disease. Fat containing midline ventral hernia is noted.  IMPRESSION: L4 superior endplate compression fracture with vertebral body height loss of approximately 25% appears acute or subacute. No other fracture is identified.  Marked lower lumbar spondylosis. Mild degenerative disease about the hips and moderate degenerative change at the symphysis pubis.  Atherosclerosis.  Fat containing midline ventral hernia.   Electronically Signed   By: Drusilla Kannerhomas  Dalessio M.D.   On: 12/22/2018 08:45       Subjective: Chief Complaint  Patient presents with  . Lower Back - Follow-up  . Pelvis - Follow-up    Review CT of pelvis without contrast    85 year old female with left butock pain with some radiation into the left posterior leg. She is not complaining as much as previously. She has swelling  Right leg and had a blood clot in the past several years. Pain improves with sitting. Left buttock. No bowel or bladder difficulty. She is staying mainly at home and not standing up or walking well.    Review of Systems   Objective: Vital Signs: BP (!) 154/72 (BP Location: Left Arm, Patient Position: Sitting)   Pulse 74   Ht 5' (1.524 m)   Wt 153 lb (69.4 kg)   BMI 29.88 kg/m   Physical Exam Constitutional:      Appearance: She is well-developed.  HENT:      Head: Normocephalic and atraumatic.  Eyes:     Pupils: Pupils are equal, round, and reactive to light.  Neck:     Musculoskeletal: Normal range of motion and neck supple.  Pulmonary:     Effort: Pulmonary effort is normal.     Breath sounds: Normal breath sounds.  Abdominal:     General: Bowel sounds are normal.     Palpations: Abdomen is soft.  Musculoskeletal: Normal range of motion.  Skin:    General: Skin is warm and dry.  Neurological:     Mental Status: She is alert and oriented to person, place, and time.  Psychiatric:        Behavior: Behavior normal.        Thought Content: Thought content normal.        Judgment: Judgment normal.     Ortho Exam  Specialty Comments:  No specialty comments available.  Imaging: No results found.   PMFS History: Patient Active Problem List   Diagnosis Date Noted  . CHF (congestive heart failure), NYHA class III, chronic, diastolic (HCC) 11/11/2018  . Hypoxemia requiring supplemental oxygen 11/10/2018  . Fatigue 11/10/2018  . Osteopenia of multiple sites 11/10/2018  . Acute flank pain 08/07/2018  . Umbilical pain 10/31/2017  . Lumbar pain 10/10/2016  . Pelvic pain 10/10/2016  . Seizures (HCC) 08/06/2014  . Abnormal involuntary movement 08/05/2014  . Hemiplegia, unspecified, affecting dominant side 08/05/2014  . Right sided weakness 08/05/2014  . CVA (cerebral vascular accident) (HCC) 07/21/2014  . Left leg pain 07/20/2014  . HTN (hypertension) 07/18/2014  . HLD (hyperlipidemia) 07/18/2014  . Thrombocytopenia (HCC) 07/18/2014  . Essential tremor 10/27/2013  . Obesity, unspecified 10/27/2013  . DM2 (diabetes mellitus, type 2) (HCC) 10/27/2013  . History of peripheral neuropathy 10/27/2013   Past Medical History:  Diagnosis Date  . Anxiety   . Diabetes (HCC) 10/27/2013  . DVT (deep venous thrombosis) (HCC)   . Essential tremor 10/27/2013  . High cholesterol 1999  . Hypertension   . Ischemic stroke (HCC) 07/18/2014  .  Obesity, unspecified 10/27/2013  . Pneumonia   . Unspecified hereditary and idiopathic peripheral neuropathy 10/27/2013    Family History  Problem Relation Age of Onset  . Stroke Mother   . Heart Problems Brother   . Coronary artery disease Other   . Heart Problems Sister     Past Surgical History:  Procedure Laterality Date  . CATARACT EXTRACTION Bilateral   . FOOT SURGERY     Social History   Occupational History    Comment: does not work  Tobacco Use  .  Smoking status: Never Smoker  . Smokeless tobacco: Never Used  Substance and Sexual Activity  . Alcohol use: No    Alcohol/week: 0.0 standard drinks  . Drug use: No  . Sexual activity: Never

## 2018-12-31 NOTE — Patient Instructions (Signed)
Fall Prevention and Home Safety Falls cause injuries and can affect all age groups. It is possible to use preventive measures to significantly decrease the likelihood of falls. There are many simple measures which can make your home safer and prevent falls. OUTDOORS  Repair cracks and edges of walkways and driveways.  Remove high doorway thresholds.  Trim shrubbery on the main path into your home.  Have good outside lighting.  Clear walkways of tools, rocks, debris, and clutter.  Check that handrails are not broken and are securely fastened. Both sides of steps should have handrails.  Have leaves, snow, and ice cleared regularly.  Use sand or salt on walkways during winter months.  In the garage, clean up grease or oil spills. BATHROOM  Install night lights.  Install grab bars by the toilet and in the tub and shower.  Use non-skid mats or decals in the tub or shower.  Place a plastic non-slip stool in the shower to sit on, if needed.  Keep floors dry and clean up all water on the floor immediately.  Remove soap buildup in the tub or shower on a regular basis.  Secure bath mats with non-slip, double-sided rug tape.  Remove throw rugs and tripping hazards from the floors. BEDROOMS  Install night lights.  Make sure a bedside light is easy to reach.  Do not use oversized bedding.  Keep a telephone by your bedside.  Have a firm chair with side arms to use for getting dressed.  Remove throw rugs and tripping hazards from the floor. KITCHEN  Keep handles on pots and pans turned toward the center of the stove. Use back burners when possible.  Clean up spills quickly and allow time for drying.  Avoid walking on wet floors.  Avoid hot utensils and knives.  Position shelves so they are not too high or low.  Place commonly used objects within easy reach.  If necessary, use a sturdy step stool with a grab bar when reaching.  Keep electrical cables out of the  way.  Do not use floor polish or wax that makes floors slippery. If you must use wax, use non-skid floor wax.  Remove throw rugs and tripping hazards from the floor. STAIRWAYS  Never leave objects on stairs.  Place handrails on both sides of stairways and use them. Fix any loose handrails. Make sure handrails on both sides of the stairways are as long as the stairs.  Check carpeting to make sure it is firmly attached along stairs. Make repairs to worn or loose carpet promptly.  Avoid placing throw rugs at the top or bottom of stairways, or properly secure the rug with carpet tape to prevent slippage. Get rid of throw rugs, if possible.  Have an electrician put in a light switch at the top and bottom of the stairs. OTHER FALL PREVENTION TIPS  Wear low-heel or rubber-soled shoes that are supportive and fit well. Wear closed toe shoes.  When using a stepladder, make sure it is fully opened and both spreaders are firmly locked. Do not climb a closed stepladder.  Add color or contrast paint or tape to grab bars and handrails in your home. Place contrasting color strips on first and last steps.  Learn and use mobility aids as needed. Install an electrical emergency response system.  Turn on lights to avoid dark areas. Replace light bulbs that burn out immediately. Get light switches that glow.  Arrange furniture to create clear pathways. Keep furniture in the same place.    Firmly attach carpet with non-skid or double-sided tape.  Eliminate uneven floor surfaces.  Select a carpet pattern that does not visually hide the edge of steps.  Be aware of all pets. OTHER HOME SAFETY TIPS  Set the water temperature for 120 F (48.8 C).  Keep emergency numbers on or near the telephone.  Keep smoke detectors on every level of the home and near sleeping areas. Document Released: 11/01/2002 Document Revised: 05/12/2012 Document Reviewed: 01/31/2012 [facility] Patient Information 2014  [place], [place]. Avoid frequent bending and stooping  No lifting greater than 10 lbs. May use ice or moist heat for pain. Weight loss is of benefit. Best medication for lumbar compression fracture is acute pain medication but we are only allowed to prescribe a week at a time.  Exercise is important to improve your indurance and does allow people to function better inspite of back pain. A consult with interventional radiology to consider an L4 kyphovertebroplasty.

## 2019-01-20 ENCOUNTER — Telehealth (INDEPENDENT_AMBULATORY_CARE_PROVIDER_SITE_OTHER): Payer: Self-pay | Admitting: Specialist

## 2019-01-20 NOTE — Telephone Encounter (Signed)
Patient left a message requesting that someone return her phone call.  CB#[phone].  Thank you.

## 2019-01-22 NOTE — Telephone Encounter (Signed)
lmom for them to call me back and if im not available to please leave a detailed message at the front desk

## 2019-01-25 ENCOUNTER — Telehealth (INDEPENDENT_AMBULATORY_CARE_PROVIDER_SITE_OTHER): Payer: Self-pay | Admitting: Specialist

## 2019-01-25 NOTE — Telephone Encounter (Signed)
There was another voicemail that was left on Dr. [place] Blas voicemail which was in turn transferred to our voicemail.  Patient did not leave a detailed message as to what they needed.  CB#[phone].  Thank you.

## 2019-01-25 NOTE — Telephone Encounter (Signed)
error 

## 2019-01-25 NOTE — Telephone Encounter (Signed)
They are trying to check the status of the referral to Dr. Julieanne Cotton, Can you please advise?

## 2019-01-26 ENCOUNTER — Telehealth (HOSPITAL_COMMUNITY): Payer: Self-pay

## 2019-01-26 NOTE — Telephone Encounter (Signed)
Called to schedule consult, no answer, left vm. AW  

## 2019-01-27 ENCOUNTER — Other Ambulatory Visit (HOSPITAL_COMMUNITY): Payer: Self-pay | Admitting: Interventional Radiology

## 2019-01-27 DIAGNOSIS — S32040A Wedge compression fracture of fourth lumbar vertebra, initial encounter for closed fracture: Secondary | ICD-10-CM

## 2019-01-27 NOTE — Telephone Encounter (Signed)
This order was faxed to [facility] at [facility] interventional radiology, pending appt

## 2019-02-04 ENCOUNTER — Other Ambulatory Visit (INDEPENDENT_AMBULATORY_CARE_PROVIDER_SITE_OTHER): Payer: Self-pay | Admitting: Specialist

## 2019-02-04 NOTE — Telephone Encounter (Signed)
Tramadol refill request 

## 2019-02-12 ENCOUNTER — Ambulatory Visit (HOSPITAL_COMMUNITY): Admission: RE | Admit: 2019-02-12 | Payer: Medicare Other | Source: Ambulatory Visit

## 2019-03-02 ENCOUNTER — Ambulatory Visit: Payer: Medicare Other | Admitting: Family Medicine

## 2019-03-02 ENCOUNTER — Telehealth: Payer: Self-pay | Admitting: *Deleted

## 2019-03-02 NOTE — Telephone Encounter (Addendum)
Spoke to son. Relayed that due to covid 19 limiting in person visits, converting to video visit, he stated no video, I offered phone call but he requested later appt, made 04-26-19 at 1030 as pt stable.  They will call back as needed.

## 2019-03-02 NOTE — Telephone Encounter (Signed)
I LMVM for pt or son, relating to converting appt to video visit for appt today at 1500.

## 2019-03-09 ENCOUNTER — Ambulatory Visit: Payer: Medicare Other | Admitting: Neurology

## 2019-03-11 ENCOUNTER — Ambulatory Visit: Payer: Medicare Other | Admitting: Neurology

## 2019-03-18 NOTE — Progress Notes (Signed)
Subjective:   Patient ID: [name] [name]    DOB: 03/20/1930, 85 y.o. female   MRN: [id number]  [name] [name] is a 85 y.o. female with a history of HTN, CVA w/ R sided weakness, HFpEF, DM2, essential tremor, HLD, obesity, seizures, osteopenia with L4 compression fracture, hypoxemia requiring supplemental O2 here for   Back pain Patient presents for left-sided lower back pain and thinks she may have a kidney infection.  States current pain has been going on for the past 10 days and is slightly more severe than her chronic pain.  Pain is located in L side of lower back and radiates to groin. She wakes up with this pain sometimes. Pain is exacerbated by lying down and walking. Sitting and standing are ok. She follows with Orthopedics for chronic L sided LBP due to osteopenia and L4 compression fracture seen on CT, acute vs. Subacute at most recent ortho visit 12/2018. Has been taking ultracet for pain with some relief. Did have appointment scheduled for radiology-guided steroid injection of L hip for arthritis pain but had to be rescheduled due to current coronavirus pandemic. She thinks she may have a kidney infection as she has had pain in this area before with a prior infection. She denies dysuria, increased urinary frequency, recent falls, rashes, night sweats, vaginal discharge  Review of Systems:  Per HPI.  PMFSH, medications and smoking status reviewed.  Objective:   BP 110/70   Pulse 83   Temp 98.5 F (36.9 C) (Oral)   Ht 5' (1.524 m)   Wt 153 lb 6 oz (69.6 kg)   SpO2 96%   BMI 29.95 kg/m  Vitals and nursing note reviewed.  General: overweight, elderly female, in no acute distress with non-toxic appearance. Exaggerated  kyphosis. CV: regular rate and rhythm without murmurs, rubs, or gallops Lungs: clear to auscultation bilaterally with normal work of breathing Skin: warm, dry, no rashes or lesions Extremities: warm and well perfused, normal tone MSK: ambulates with walker,  tenderness to palpation over lumbar area and paravertebral musculature. Significant pain with manipulation of hip Neuro: Alert and oriented, speech normal  Assessment & Plan:   Back pain Has chronic L sided back pain which appears worsened recently. Denies falls. UA obtained given concern for kidney infection however not very revealing. Also without dysuria or increased urinary frequency which makes urinary infectious etiology much less likely. CVA tenderness difficult to assess given chronic pain in that region. No rash on exam. No night sweats to concern for possible malignancy although is waking up from sleep with pain, no recent MRI. Recently had CT 11/2018 with L4 compression fracture and degenerative changes in bilateral hips. No warmth or erythema overlying the area, afebrile, low suspicion for septic arthritis. Based on exacerbation of same pain on exam with manipulation of hip and lack of other evidence for alternative etiology, believe hip arthritis is also contributing to chronic LBP from compression fracture. Has appointment for steroid injection with radiology. Will refill patient's ultracet, PMP Aware checked without red flags. Given h/o CHF, will avoid NSAIDs. Instructed to follow up with Orthopedics soon.  Orders Placed This Encounter  Procedures  . POCT urinalysis dipstick  . POCT UA - Microscopic Only   Meds ordered this encounter  Medications  . traMADol-acetaminophen (ULTRACET) 37.5-325 MG tablet    Sig: TAKE 2 TABLETS BY MOUTH EVERY 4 HOURS AS NEEDED    Dispense:  50 tablet    Refill:  0    Not to exceed  5 additional fills before 06/29/2019    Ellwood Dense, DO PGY-2, [facility] 03/19/2019 6:56 PM

## 2019-03-19 ENCOUNTER — Ambulatory Visit (INDEPENDENT_AMBULATORY_CARE_PROVIDER_SITE_OTHER): Payer: Medicare Other | Admitting: Family Medicine

## 2019-03-19 ENCOUNTER — Other Ambulatory Visit: Payer: Self-pay

## 2019-03-19 VITALS — BP 110/70 | HR 83 | Temp 98.5°F | Ht 60.0 in | Wt 153.4 lb

## 2019-03-19 DIAGNOSIS — M549 Dorsalgia, unspecified: Secondary | ICD-10-CM

## 2019-03-19 LAB — POCT URINALYSIS DIP (MANUAL ENTRY)
Bilirubin, UA: NEGATIVE
Glucose, UA: NEGATIVE mg/dL
Ketones, POC UA: NEGATIVE mg/dL
Nitrite, UA: NEGATIVE
Protein Ur, POC: NEGATIVE mg/dL
Spec Grav, UA: 1.015 (ref 1.010–1.025)
Urobilinogen, UA: 0.2 E.U./dL
pH, UA: 7 (ref 5.0–8.0)

## 2019-03-19 LAB — POCT UA - MICROSCOPIC ONLY

## 2019-03-19 MED ORDER — TRAMADOL-ACETAMINOPHEN 37.5-325 MG PO TABS: ORAL_TABLET | ORAL | 0 refills | Status: DC

## 2019-03-19 NOTE — Assessment & Plan Note (Addendum)
Has chronic L sided back pain which appears worsened recently. Denies falls. UA obtained given concern for kidney infection however not very revealing. Also without dysuria or increased urinary frequency which makes urinary infectious etiology much less likely. CVA tenderness difficult to assess given chronic pain in that region. No rash on exam. No night sweats to concern for possible malignancy although is waking up from sleep with pain, no recent MRI. Recently had CT 11/2018 with L4 compression fracture and degenerative changes in bilateral hips. No warmth or erythema overlying the area, afebrile, low suspicion for septic arthritis. Based on exacerbation of same pain on exam with manipulation of hip and lack of other evidence for alternative etiology, believe hip arthritis is also contributing to chronic LBP from compression fracture. Has appointment for steroid injection with radiology. Will refill patient's ultracet, PMP Aware checked without red flags. Given h/o CHF, will avoid NSAIDs. Instructed to follow up with Orthopedics soon.

## 2019-03-19 NOTE — Patient Instructions (Signed)
It was great to see you!  Our plans for today:  - Your pain is likely coming from your back fracture and hip arthritis. I will refill your pain medication until you are able to get your steroid shot with Radiology. Follow up with Dr. Otelia Sergeant as scheduled.  Take care and seek immediate care sooner if you develop any concerns.   Dr. [facility]

## 2019-04-15 ENCOUNTER — Other Ambulatory Visit (INDEPENDENT_AMBULATORY_CARE_PROVIDER_SITE_OTHER): Payer: Self-pay | Admitting: Specialist

## 2019-04-15 NOTE — Telephone Encounter (Signed)
Ultracet refill request 

## 2019-04-20 ENCOUNTER — Telehealth: Payer: Self-pay

## 2019-04-20 NOTE — Telephone Encounter (Signed)
Unable to get in contact with the patient's son to convert his mother's appt into a doxy.me visit due to the line being busy and no option for voicemail. I will attempt to call back later today.

## 2019-04-26 ENCOUNTER — Ambulatory Visit (INDEPENDENT_AMBULATORY_CARE_PROVIDER_SITE_OTHER): Payer: Medicare Other | Admitting: Family Medicine

## 2019-04-26 ENCOUNTER — Other Ambulatory Visit: Payer: Self-pay

## 2019-04-26 DIAGNOSIS — Z8673 Personal history of transient ischemic attack (TIA), and cerebral infarction without residual deficits: Secondary | ICD-10-CM | POA: Diagnosis not present

## 2019-04-26 DIAGNOSIS — R569 Unspecified convulsions: Secondary | ICD-10-CM

## 2019-04-26 DIAGNOSIS — Z8669 Personal history of other diseases of the nervous system and sense organs: Secondary | ICD-10-CM

## 2019-04-26 DIAGNOSIS — G250 Essential tremor: Secondary | ICD-10-CM

## 2019-04-26 NOTE — Telephone Encounter (Signed)
Spoke with the patient's son and he has given verbal consent to do a telephone visit and to file her insurance. He can be reached at [phone]

## 2019-04-26 NOTE — Progress Notes (Addendum)
PATIENT: [name] [name] DOB: 1930-02-18  REASON FOR VISIT: follow up HISTORY FROM: patient  Virtual Visit via Telephone Note  I connected with [name] [name] on 04/27/19 at 10:30 AM EDT by telephone and verified that I am speaking with the correct person using two identifiers.   I discussed the limitations, risks, security and privacy concerns of performing an evaluation and management service by telephone and the availability of in person appointments. I also discussed with the patient that there may be a patient responsible charge related to this service. The patient expressed understanding and agreed to proceed.   History of Present Illness:  04/27/19 [name] [name] is a 85 y.o. female for follow up of essential tremor, seizure, peripheral neuropathy and previous CVA. Her son aids in history and translation per patient's request. She is taking primidone 250 BID, levetiracetam 500mg  BID and gabapentin 100mg  at bedtime. Last CMP with PCP reviewed. Kidneys and liver function normal. Calcium was 8.4, followed by PCP. She is taking Plavix and atorvastatin as prescribed. Left hand tremor is worse than right. No seizure activity. She feels gabapentin is helping with neuropathy and she is tolerating medications well.    History (copied from Dr Teofilo Pod note on 09/07/2018)  Interim history:   [name] [name] is a very pleasant 85 year old Middle Eastern/[place]ian lady with an underlying medical history of hypertension, hyperlipidemia, DVT, pneumonia, anxiety, obesity, diabetes, peripheral neuropathy, stroke and essential tremor, who presents for follow-up consultation of her essential tremor and history of seizure, history of stroke. The patient is accompanied by her son again today. I last saw her on 07/08/2017 for a sooner than scheduled appointment because she felt that the tremor was worse, she also had intermittent leg cramps. She had issues with low back pain. She had a recent lumbar spine  MRI. She was supposed to see Dr. Alvester Morin for injection into the spine. She was advised to continue with low-dose gabapentin, Mysoline twice daily, and Keppra twice daily.  Today, 09/07/2018: She reports doing okay. She has been seeing Dr. Alvester Morin. She recently saw Dr. Otelia Sergeant.  She had blood work recently on 06/22/2018 which I reviewed, she had lipid panel, BMP, CBC and A1c (5.4).  She still has some back pain. Bone density test ordered by orthopedics on 08/07/2018. She has evidence of osteopenia. Thankfully, she has not fallen. Her right side has residual weakness from the previous stroke. She has some grip strength weakness in the left hand. She tries to use both hands to knit. Cognitively, no new concerns, per son, has good memory. She does have numbness in her distal lower extremities but no painful sensations. We have previously reduced her gabapentin secondary to sedation.    Observations/Objective:  Generalized: Well developed, in no acute distress  Mentation: Alert oriented to time, place, history taking. Follows all commands speech and language fluent   Assessment and Plan:  85 y.o. year old female  has a past medical history of Anxiety, Diabetes (HCC) (10/27/2013), DVT (deep venous thrombosis) (HCC), Essential tremor (10/27/2013), High cholesterol (1999), Hypertension, Ischemic stroke (HCC) (07/18/2014), Obesity, unspecified (10/27/2013), Pneumonia, and Unspecified hereditary and idiopathic peripheral neuropathy (10/27/2013). here with    ICD-10-CM   1. Seizures (HCC) R56.9   2. Essential tremor G25.0   3. History of CVA (cerebrovascular accident) Z86.73   4. History of peripheral neuropathy Z86.69    She is doing well with current medication regimen. We will continue primidone, levetiracetam and gabapentin as prescribed. Plavix and atorvastatin continued for  stroke prevention. She will monitor left hand tremor and call for worsening. Son requests follow up with Dr Frances Furbish. 6 month follow up  scheduled. Son verbalizes understanding and agreement with plan.   No orders of the defined types were placed in this encounter.   No orders of the defined types were placed in this encounter.    Follow Up Instructions:  I discussed the assessment and treatment plan with the patient. The patient was provided an opportunity to ask questions and all were answered. The patient agreed with the plan and demonstrated an understanding of the instructions.   The patient was advised to call back or seek an in-person evaluation if the symptoms worsen or if the condition fails to improve as anticipated.  I provided 30 minutes of non-face-to-face time during this encounter. Patient and son are located at their place of residence during teleconference. Provider is located at her place of residence. Rozell Searing, CMA helped to facilitate visit.    Shawnie Dapper, NP    I reviewed the above note and documentation by the Nurse Practitioner and agree with the history, assessment and plan as outlined above. I was immediately available for consultation. Huston Foley, MD, PhD [facility])

## 2019-04-27 ENCOUNTER — Encounter: Payer: Self-pay | Admitting: Family Medicine

## 2019-05-05 ENCOUNTER — Other Ambulatory Visit (HOSPITAL_COMMUNITY): Payer: Self-pay | Admitting: Interventional Radiology

## 2019-05-05 DIAGNOSIS — S32040A Wedge compression fracture of fourth lumbar vertebra, initial encounter for closed fracture: Secondary | ICD-10-CM

## 2019-05-10 ENCOUNTER — Encounter (HOSPITAL_COMMUNITY): Payer: Self-pay

## 2019-05-10 ENCOUNTER — Ambulatory Visit (HOSPITAL_COMMUNITY): Payer: Medicare Other | Attending: Interventional Radiology

## 2019-05-16 ENCOUNTER — Other Ambulatory Visit: Payer: Self-pay | Admitting: Neurology

## 2019-05-16 ENCOUNTER — Other Ambulatory Visit: Payer: Self-pay | Admitting: Family Medicine

## 2019-05-16 DIAGNOSIS — R531 Weakness: Secondary | ICD-10-CM

## 2019-05-16 DIAGNOSIS — E785 Hyperlipidemia, unspecified: Secondary | ICD-10-CM

## 2019-05-17 ENCOUNTER — Other Ambulatory Visit: Payer: Self-pay

## 2019-05-17 ENCOUNTER — Ambulatory Visit (HOSPITAL_COMMUNITY)
Admission: RE | Admit: 2019-05-17 | Discharge: 2019-05-17 | Disposition: A | Discharge status: Home / Self Care | Payer: Medicare Other | Source: Ambulatory Visit | Attending: Interventional Radiology | Admitting: Interventional Radiology

## 2019-05-17 DIAGNOSIS — S32040A Wedge compression fracture of fourth lumbar vertebra, initial encounter for closed fracture: Secondary | ICD-10-CM

## 2019-05-18 ENCOUNTER — Telehealth (HOSPITAL_COMMUNITY): Payer: Self-pay

## 2019-05-18 ENCOUNTER — Other Ambulatory Visit (HOSPITAL_COMMUNITY): Payer: Self-pay | Admitting: Internal Medicine

## 2019-05-18 NOTE — Telephone Encounter (Signed)
Called to schedule mri, no answer, left vm. AW 

## 2019-05-19 ENCOUNTER — Other Ambulatory Visit (HOSPITAL_COMMUNITY): Payer: Self-pay | Admitting: Interventional Radiology

## 2019-05-19 DIAGNOSIS — S32010A Wedge compression fracture of first lumbar vertebra, initial encounter for closed fracture: Secondary | ICD-10-CM

## 2019-05-19 DIAGNOSIS — S32040A Wedge compression fracture of fourth lumbar vertebra, initial encounter for closed fracture: Secondary | ICD-10-CM

## 2019-05-26 ENCOUNTER — Other Ambulatory Visit: Payer: Self-pay

## 2019-05-26 ENCOUNTER — Ambulatory Visit (HOSPITAL_COMMUNITY)
Admission: RE | Admit: 2019-05-26 | Discharge: 2019-05-26 | Disposition: A | Discharge status: Home / Self Care | Payer: Medicare Other | Source: Ambulatory Visit | Attending: Interventional Radiology | Admitting: Interventional Radiology

## 2019-05-26 DIAGNOSIS — S32010A Wedge compression fracture of first lumbar vertebra, initial encounter for closed fracture: Secondary | ICD-10-CM

## 2019-05-26 DIAGNOSIS — S32040A Wedge compression fracture of fourth lumbar vertebra, initial encounter for closed fracture: Secondary | ICD-10-CM | POA: Diagnosis present

## 2019-06-02 ENCOUNTER — Other Ambulatory Visit (HOSPITAL_COMMUNITY): Payer: Self-pay | Admitting: Interventional Radiology

## 2019-06-02 ENCOUNTER — Telehealth (HOSPITAL_COMMUNITY): Payer: Self-pay

## 2019-06-02 DIAGNOSIS — S32040A Wedge compression fracture of fourth lumbar vertebra, initial encounter for closed fracture: Secondary | ICD-10-CM

## 2019-06-02 NOTE — Telephone Encounter (Signed)
Called to schedule kyphoplasty, no answer, left vm. AW  

## 2019-06-13 ENCOUNTER — Other Ambulatory Visit: Payer: Self-pay | Admitting: Radiology

## 2019-06-14 ENCOUNTER — Other Ambulatory Visit: Payer: Self-pay | Admitting: Student

## 2019-06-15 ENCOUNTER — Ambulatory Visit (HOSPITAL_COMMUNITY)
Admission: RE | Admit: 2019-06-15 | Discharge: 2019-06-15 | Disposition: A | Discharge status: Home / Self Care | Payer: Medicare Other | Source: Ambulatory Visit | Attending: Interventional Radiology | Admitting: Interventional Radiology

## 2019-06-15 ENCOUNTER — Encounter (HOSPITAL_COMMUNITY): Payer: Self-pay

## 2019-06-15 ENCOUNTER — Other Ambulatory Visit: Payer: Self-pay

## 2019-06-15 DIAGNOSIS — Z8673 Personal history of transient ischemic attack (TIA), and cerebral infarction without residual deficits: Secondary | ICD-10-CM | POA: Insufficient documentation

## 2019-06-15 DIAGNOSIS — E7800 Pure hypercholesterolemia, unspecified: Secondary | ICD-10-CM | POA: Diagnosis not present

## 2019-06-15 DIAGNOSIS — Z86718 Personal history of other venous thrombosis and embolism: Secondary | ICD-10-CM | POA: Insufficient documentation

## 2019-06-15 DIAGNOSIS — G250 Essential tremor: Secondary | ICD-10-CM | POA: Diagnosis not present

## 2019-06-15 DIAGNOSIS — E669 Obesity, unspecified: Secondary | ICD-10-CM | POA: Diagnosis not present

## 2019-06-15 DIAGNOSIS — Z6833 Body mass index (BMI) 33.0-33.9, adult: Secondary | ICD-10-CM | POA: Insufficient documentation

## 2019-06-15 DIAGNOSIS — E1142 Type 2 diabetes mellitus with diabetic polyneuropathy: Secondary | ICD-10-CM | POA: Insufficient documentation

## 2019-06-15 DIAGNOSIS — M899 Disorder of bone, unspecified: Secondary | ICD-10-CM | POA: Diagnosis not present

## 2019-06-15 DIAGNOSIS — I10 Essential (primary) hypertension: Secondary | ICD-10-CM | POA: Insufficient documentation

## 2019-06-15 DIAGNOSIS — S32040A Wedge compression fracture of fourth lumbar vertebra, initial encounter for closed fracture: Secondary | ICD-10-CM

## 2019-06-15 DIAGNOSIS — Z8249 Family history of ischemic heart disease and other diseases of the circulatory system: Secondary | ICD-10-CM | POA: Insufficient documentation

## 2019-06-15 DIAGNOSIS — Z7902 Long term (current) use of antithrombotics/antiplatelets: Secondary | ICD-10-CM | POA: Insufficient documentation

## 2019-06-15 DIAGNOSIS — Z823 Family history of stroke: Secondary | ICD-10-CM | POA: Insufficient documentation

## 2019-06-15 DIAGNOSIS — Z79899 Other long term (current) drug therapy: Secondary | ICD-10-CM | POA: Diagnosis not present

## 2019-06-15 DIAGNOSIS — X58XXXA Exposure to other specified factors, initial encounter: Secondary | ICD-10-CM | POA: Diagnosis not present

## 2019-06-15 HISTORY — PX: IR KYPHO LUMBAR INC FX REDUCE BONE BX UNI/BIL CANNULATION INC/IMAGING: IMG5519

## 2019-06-15 LAB — URINALYSIS, COMPLETE (UACMP) WITH MICROSCOPIC
Bacteria, UA: NONE SEEN
Bilirubin Urine: NEGATIVE
Glucose, UA: NEGATIVE mg/dL
Hgb urine dipstick: NEGATIVE
Ketones, ur: NEGATIVE mg/dL
Leukocytes,Ua: NEGATIVE
Nitrite: NEGATIVE
Protein, ur: NEGATIVE mg/dL
Specific Gravity, Urine: 1.014 (ref 1.005–1.030)
pH: 8 (ref 5.0–8.0)

## 2019-06-15 LAB — CBC
HCT: 44.4 % (ref 36.0–46.0)
Hemoglobin: 14.9 g/dL (ref 12.0–15.0)
MCH: 31.3 pg (ref 26.0–34.0)
MCHC: 33.6 g/dL (ref 30.0–36.0)
MCV: 93.3 fL (ref 80.0–100.0)
Platelets: 100 10*3/uL — ABNORMAL LOW (ref 150–400)
RBC: 4.76 MIL/uL (ref 3.87–5.11)
RDW: 13.7 % (ref 11.5–15.5)
WBC: 4.1 10*3/uL (ref 4.0–10.5)
nRBC: 0 % (ref 0.0–0.2)

## 2019-06-15 LAB — BASIC METABOLIC PANEL
Anion gap: 9 (ref 5–15)
BUN: 11 mg/dL (ref 8–23)
CO2: 26 mmol/L (ref 22–32)
Calcium: 9 mg/dL (ref 8.9–10.3)
Chloride: 105 mmol/L (ref 98–111)
Creatinine, Ser: 0.8 mg/dL (ref 0.44–1.00)
GFR calc Af Amer: >60 mL/min (ref >60)
GFR calc non Af Amer: >60 mL/min (ref >60)
Glucose, Bld: 96 mg/dL (ref 70–99)
Potassium: 5 mmol/L (ref 3.5–5.1)
Sodium: 140 mmol/L (ref 135–145)

## 2019-06-15 LAB — PROTIME-INR
INR: 1.1 (ref 0.8–1.2)
Prothrombin Time: 14.2 seconds (ref 11.4–15.2)

## 2019-06-15 LAB — GLUCOSE, CAPILLARY: Glucose-Capillary: 93 mg/dL (ref 70–99)

## 2019-06-15 MED ORDER — CEFAZOLIN SODIUM-DEXTROSE 2-4 GM/100ML-% IV SOLN: 2.0000 g | INTRAVENOUS | Status: DC

## 2019-06-15 MED ORDER — SODIUM CHLORIDE 0.9 % IV SOLN
INTRAVENOUS | Status: DC
  Administered 2019-06-15: 10:00:00 via INTRAVENOUS

## 2019-06-15 MED ORDER — CEFAZOLIN SODIUM-DEXTROSE 2-4 GM/100ML-% IV SOLN
INTRAVENOUS | Status: AC | PRN
  Administered 2019-06-15: 2 g via INTRAVENOUS

## 2019-06-15 MED ORDER — HYDROMORPHONE HCL 1 MG/ML IJ SOLN
INTRAMUSCULAR | Status: AC
  Filled 2019-06-15: qty 1

## 2019-06-15 MED ORDER — GELATIN ABSORBABLE 12-7 MM EX MISC
CUTANEOUS | Status: AC
  Filled 2019-06-15: qty 1

## 2019-06-15 MED ORDER — BUPIVACAINE HCL (PF) 0.5 % IJ SOLN
INTRAMUSCULAR | Status: AC
  Filled 2019-06-15: qty 30

## 2019-06-15 MED ORDER — SODIUM CHLORIDE 0.9 % IV SOLN
INTRAVENOUS | Status: AC | PRN
  Administered 2019-06-15: 75 mL/h via INTRAVENOUS

## 2019-06-15 MED ORDER — CEFAZOLIN SODIUM-DEXTROSE 2-4 GM/100ML-% IV SOLN
INTRAVENOUS | Status: AC
  Filled 2019-06-15: qty 100

## 2019-06-15 MED ORDER — TOBRAMYCIN SULFATE 1.2 G IJ SOLR
INTRAMUSCULAR | Status: AC
  Filled 2019-06-15: qty 1.2

## 2019-06-15 MED ORDER — HYDROMORPHONE HCL 1 MG/ML IJ SOLN
INTRAMUSCULAR | Status: AC | PRN
  Administered 2019-06-15: 1 mg via INTRAVENOUS

## 2019-06-15 MED ORDER — IOHEXOL 300 MG/ML  SOLN: 5.0000 mL | Freq: Once | INTRAMUSCULAR | Status: DC | PRN

## 2019-06-15 MED ORDER — MIDAZOLAM HCL 2 MG/2ML IJ SOLN
INTRAMUSCULAR | Status: AC | PRN
  Administered 2019-06-15 (×2): 1 mg via INTRAVENOUS

## 2019-06-15 MED ORDER — FENTANYL CITRATE (PF) 100 MCG/2ML IJ SOLN
INTRAMUSCULAR | Status: AC
  Filled 2019-06-15: qty 2

## 2019-06-15 MED ORDER — FENTANYL CITRATE (PF) 100 MCG/2ML IJ SOLN
INTRAMUSCULAR | Status: AC | PRN
  Administered 2019-06-15 (×3): 25 ug via INTRAVENOUS

## 2019-06-15 MED ORDER — SODIUM CHLORIDE 0.9 % IV SOLN: INTRAVENOUS | Status: AC

## 2019-06-15 MED ORDER — MIDAZOLAM HCL 2 MG/2ML IJ SOLN
INTRAMUSCULAR | Status: AC
  Filled 2019-06-15: qty 2

## 2019-06-15 NOTE — Procedures (Signed)
S/P L 4 balloon  KP . Bi pedicular approach

## 2019-06-15 NOTE — H&P (Signed)
Chief Complaint: Patient was seen in consultation today for Lumbar 4 Kyphoplasty at the request of Dr Antionette Fairy  Supervising Physician: Luanne Bras  Patient Status: [facility] - Out-pt  History of Present Illness: [name] [name] is a 85 y.o. female   Hx CVA; DVT; HTN; DM Chronic back pain New- worsening pain in back for weeks- Meds no relief Denies fall- injury  CT 05/17/19: IMPRESSION: 1. L4 remote compression fracture which has been noted since 12/15/2018. 2. L1 remote compression fracture. 3. Advanced degenerative disease with L4-5 anterolisthesis  MRI 05/27/19:  IMPRESSION: 1. L4 remote compression fracture which has been noted since 12/15/2018. 2. L1 remote compression fracture. 3. Advanced degenerative disease with L4-5 anterolisthesis  Was seen with Dr Louanne Skye early June and had "injections" per son-- no relief Now scheduled for Lumbar 4 Kyphoplasty  LD Plavix Thurs 7/16  Past Medical History:  Diagnosis Date   Anxiety    Diabetes ([place]) 10/27/2013   DVT (deep venous thrombosis) (HCC)    Essential tremor 10/27/2013   High cholesterol 1999   Hypertension    Ischemic stroke ([place]) 07/18/2014   Obesity, unspecified 10/27/2013   Pneumonia    Unspecified hereditary and idiopathic peripheral neuropathy 10/27/2013    Past Surgical History:  Procedure Laterality Date   CATARACT EXTRACTION Bilateral    FOOT SURGERY      Allergies: Tape  Medications: Prior to Admission medications   Medication Sig Start Date End Date Taking? Authorizing Provider  acetaminophen (TYLENOL) 325 MG tablet Take 2 tablets (650 mg total) by mouth every 6 (six) hours as needed. 06/01/17  Yes Varney Biles, MD  ALPRAZolam (XANAX) 0.25 MG tablet Take 0.25 mg by mouth 2 (two) times daily as needed for anxiety.    Yes [provider]  amLODipine (NORVASC) 2.5 MG tablet Take 2.5 mg by mouth daily. 05/29/17  Yes [provider]  atorvastatin (LIPITOR) 20 MG tablet  Take 1 tablet (20 mg total) by mouth every evening. 06/22/18  Yes Sela Hilding, MD  CALCIUM PO Take 1 tablet by mouth daily.   Yes [provider]  Cholecalciferol (VITAMIN D PO) Take 1 tablet by mouth daily.   Yes [provider]  diclofenac sodium (VOLTAREN) 1 % GEL APPLY 2 GRAMS TOPICALLY 4 (FOUR) TIMES DAILY. Patient taking differently: Apply 1 application topically 3 (three) times daily as needed (pain).  05/17/19  Yes Sela Hilding, MD  DULoxetine (CYMBALTA) 20 MG capsule TAKE 1 CAPSULE BY MOUTH EVERY DAY 12/14/18  Yes Jessy Oto, MD  furosemide (LASIX) 20 MG tablet Take 20 mg by mouth daily. 06/08/18  Yes [provider]  gabapentin (NEURONTIN) 100 MG capsule Take 1 capsule (100 mg total) by mouth at bedtime. 09/07/18  Yes Star Age, MD  levETIRAcetam (KEPPRA) 500 MG tablet Take 1 tablet (500 mg total) by mouth 2 (two) times daily. 09/07/18  Yes Star Age, MD  Omega-3 Fatty Acids (FISH OIL) 1000 MG CAPS Take 1 capsule by mouth every other day.    Yes [provider]  PAZEO 0.7 % SOLN Place 1 drop into both eyes at bedtime.  07/24/18  Yes [provider]  potassium chloride SA (K-DUR,KLOR-CON) 20 MEQ tablet Take 20 mEq by mouth daily.    Yes [provider]  primidone (MYSOLINE) 250 MG tablet Take 1 tablet (250 mg total) by mouth 2 (two) times daily. 09/07/18  Yes Star Age, MD  RESTASIS 0.05 % ophthalmic emulsion Place 1 drop into both eyes  2 (two) times daily.  09/29/14  Yes [provider]  traMADol-acetaminophen (ULTRACET) 37.5-325 MG tablet TAKE 2 TABLETS BY MOUTH EVERY 4 HOURS AS NEEDED FOR UP TO 7 DAYS. Patient taking differently: Take 2 tablets by mouth every 4 (four) hours as needed (pain). TAKE 2 TABLETS BY MOUTH EVERY 4 HOURS AS NEEDED FOR UP TO 7 DAYS. 04/16/19  Yes Kerrin ChampagneNitka, James E, MD  clopidogrel (PLAVIX) 75 MG tablet TAKE 1 TABLET (75 MG TOTAL) BY MOUTH DAILY. 05/17/19   Lomax, Amy, NP  glucose blood  (ACCU-CHEK AVIVA) test strip Use as instructed to test once a day Dx code:E11.9 11/11/18   Carney Livinghambliss, Marshall L, MD  lidocaine (LIDODERM) 5 % Place 1 patch onto the skin daily. Remove & Discard patch within 12 hours or as directed by MD Patient not taking: Reported on 06/09/2019 12/17/18   Garth Bignessimberlake, Kathryn, MD  nystatin (MYCOSTATIN/NYSTOP) powder Apply topically 4 (four) times daily. Patient not taking: Reported on 06/09/2019 12/17/18   Garth Bignessimberlake, Kathryn, MD     Family History  Problem Relation Age of Onset   Stroke Mother    Heart Problems Brother    Coronary artery disease Other    Heart Problems Sister     Social History   Socioeconomic History   Marital status: Widowed    Spouse name: Not on file   Number of children: 5   Years of education: 9   Highest education level: Not on file  Occupational History    Comment: does not work  Ecologistocial Needs   Financial resource strain: Not on file   Food insecurity    Worry: Not on file    Inability: Not on Occupational hygienistfile   Transportation needs    Medical: Not on file    Non-medical: Not on file  Tobacco Use   Smoking status: Never Smoker   Smokeless tobacco: Never Used  Substance and Sexual Activity   Alcohol use: No    Alcohol/week: 0.0 standard drinks   Drug use: No   Sexual activity: Never  Lifestyle   Physical activity    Days per week: Not on file    Minutes per session: Not on file   Stress: Not on file  Relationships   Social connections    Talks on phone: Not on file    Gets together: Not on file    Attends religious service: Not on file    Active member of club or organization: Not on file    Attends meetings of clubs or organizations: Not on file    Relationship status: Not on file  Other Topics Concern   Not on file  Social History Narrative   Patient is right handed and resides with son    Review of Systems: A 12 point ROS discussed and pertinent positives are indicated in the HPI above.  All  other systems are negative.  Review of Systems  Constitutional: Positive for activity change. Negative for fatigue and fever.  Respiratory: Negative for cough.   Cardiovascular: Negative for chest pain.  Gastrointestinal: Negative for abdominal pain.  Musculoskeletal: Positive for back pain and gait problem.  Neurological: Positive for weakness.  Psychiatric/Behavioral: Negative for behavioral problems and confusion.    Vital Signs: BP (!) 135/56    Pulse 72    Temp (!) 97.5 F (36.4 C) (Oral)    Ht 5' (1.524 m)    Wt 173 lb (78.5 kg)    SpO2 96%    BMI 33.79 kg/m  Physical Exam Vitals signs reviewed.  Cardiovascular:     Rate and Rhythm: Normal rate and regular rhythm.     Heart sounds: Normal heart sounds.  Pulmonary:     Effort: Pulmonary effort is normal.     Breath sounds: Normal breath sounds.  Abdominal:     Tenderness: There is no abdominal tenderness.  Musculoskeletal: Normal range of motion.     Comments: Low back pain  Skin:    General: Skin is warm and dry.  Neurological:     Mental Status: She is alert and oriented to person, place, and time.  Psychiatric:        Behavior: Behavior normal.     Comments: Speak Farci/Persian language Son at bedside as interpreter     Imaging: Ct Lumbar Spine Wo Contrast  Result Date: 05/18/2019 CLINICAL DATA:  Closed compression fracture of fourth lumbar vertebra EXAM: CT LUMBAR SPINE WITHOUT CONTRAST TECHNIQUE: Multidetector CT imaging of the lumbar spine was performed without intravenous contrast administration. Multiplanar CT image reconstructions were also generated. COMPARISON:  Lumbar radiography 12/15/2018 FINDINGS: Segmentation: 5 lumbar type vertebrae Alignment: Grade 1 anterolisthesis at L4-5, degenerative. Vertebrae: L4 compression fracture with moderate height loss. Fracture is chronic based on 12/15/2018 radiograph. There is sclerosis along the fracture planes with no visible continuous fracture lucency. L4 right  transverse process fracture with healed nonunion. Remote L1 compression fracture moderate central height loss. No retropulsion. No evident acute fracture. Paraspinal and other soft tissues: No emergent finding. Disc levels: T12- L1: Spondylosis with disc degeneration and narrowing. No evident impingement L1-L2: Spondylosis with disc narrowing and fissuring. No evident impingement L2-L3: Disc narrowing and bulging with fissuring.  Negative facets. L3-L4: Disc narrowing and bulging with fissuring. Posterior element hypertrophy. L4-L5: Advanced facet arthropathy with spurring and anterolisthesis. Disc narrowing and fissuring. Mild-to-moderate spinal stenosis. L5-S1:Degenerative facet spurring. Disc narrowing and fissuring. No evident impingement IMPRESSION: 1. L4 remote compression fracture which has been noted since 12/15/2018. 2. L1 remote compression fracture. 3. Advanced degenerative disease with L4-5 anterolisthesis. Electronically Signed   By: Marnee SpringJonathon  Watts M.D.   On: 05/18/2019 08:47   Mr Lumbar Spine Wo Contrast  Result Date: 05/27/2019 CLINICAL DATA:  Low back pain for years. History of compression fracture EXAM: MRI LUMBAR SPINE WITHOUT CONTRAST TECHNIQUE: Multiplanar, multisequence MR imaging of the lumbar spine was performed. No intravenous contrast was administered. COMPARISON:  Lumbar CT 05/17/2019 FINDINGS: Segmentation:  5 lumbar type vertebrae Alignment:  Degenerative grade 1 anterolisthesis at L4-5 Vertebrae: Trace STIR hyperintensity in a band following the L4 superior endplate fracture, nearly completely healed. Height loss is mild. L1 moderate remote compression fracture with no marrow edema. No acute fracture is seen. No evidence of aggressive bone lesion Conus medullaris and cauda equina: Conus extends to the L1-2 level. Conus and cauda equina appear normal. Paraspinal and other soft tissues: Negative Disc levels: T12- L1: Degenerative disc narrowing with mild ridging and bulging. No  impingement L1-L2: Disc narrowing and bulging with a right foraminal protrusion. Mild posterior element hypertrophy. No impingement L2-L3: Disc narrowing and bulging. Mild posterior element hypertrophy. No impingement L3-L4: Disc narrowing and bulging. Mild posterior element hypertrophy. No impingement L4-L5: Advanced facet degeneration with anterolisthesis. The disc is narrowed and desiccated. No impingement L5-S1:Advanced facet spurring. The disc is narrowed and bulging with far-lateral endplate spurring. No impingement IMPRESSION: 1. Nonacute L4 superior endplate fracture with trace residual marrow edema. The fracture is nearly completely healed. 2. Remote L1 compression fracture. 3. No acute fracture. 4.  Generalized degenerative disease as described. No high-grade stenosis. Electronically Signed   By: Marnee SpringJonathon  Watts M.D.   On: 05/27/2019 04:16    Labs:  CBC: Recent Labs    06/22/18 1448 11/10/18 1656 11/13/18 1941  WBC 4.2 4.3 6.5  HGB 14.6 14.6 14.2  HCT 43.8 44.4 43.2  PLT 107* 115* 97*    COAGS: No results for input(s): INR, APTT in the last 8760 hours.  BMP: Recent Labs    06/22/18 1448 11/13/18 1941  NA 141 138  K 4.7 3.6  CL 103 102  CO2 24 26  GLUCOSE 87 100*  BUN 14 13  CALCIUM 9.4 8.4*  CREATININE 0.80 0.78  GFRNONAA 67 >60  GFRAA 78 >60    LIVER FUNCTION TESTS: Recent Labs    11/13/18 1941  BILITOT 0.5  AST 26  ALT 27  ALKPHOS 72  PROT 6.8  ALBUMIN 3.7    TUMOR MARKERS: No results for input(s): AFPTM, CEA, CA199, CHROMGRNA in the last 8760 hours.  Assessment and Plan:  Painful L4 acute fracture Scheduled for Lumbar 4 Kyphoplasty Risks and benefits of L4 KP were discussed with the patient including, but not limited to education regarding the natural healing process of compression fractures without intervention, bleeding, infection, cement migration which may cause spinal cord damage, paralysis, pulmonary embolism or even death.  This  interventional procedure involves the use of X-rays and because of the nature of the planned procedure, it is possible that we will have prolonged use of X-ray fluoroscopy.  Potential radiation risks to you include (but are not limited to) the following: - A slightly elevated risk for cancer  several years later in life. This risk is typically less than 0.5% percent. This risk is low in comparison to the normal incidence of human cancer, which is 33% for women and 50% for men according to the American Cancer Society. - Radiation induced injury can include skin redness, resembling a rash, tissue breakdown / ulcers and hair loss (which can be temporary or permanent).   The likelihood of either of these occurring depends on the difficulty of the procedure and whether you are sensitive to radiation due to previous procedures, disease, or genetic conditions.   IF your procedure requires a prolonged use of radiation, you will be notified and given written instructions for further action.  It is your responsibility to monitor the irradiated area for the 2 weeks following the procedure and to notify your physician if you are concerned that you have suffered a radiation induced injury.    All of the patient's questions were answered, patient is agreeable to proceed. Consent signed and in chart.   Thank you for this interesting consult.  I greatly enjoyed meeting [name] [name] and look forward to participating in their care.  A copy of this report was sent to the requesting provider on this date.  Electronically Signed: Robet LeuPamela A Amahd Morino, PA-C 06/15/2019, 9:27 AM   I spent a total of  30 Minutes   in face to face in clinical consultation, greater than 50% of which was counseling/coordinating care for L4 KP

## 2019-06-15 NOTE — Discharge Instructions (Signed)
1. No stooping,bending or lifting more than 10 lbs foe 2 weeks. 2.Use walker to ambulate for 2 weeks. 3.No driving for 2 weeks. 4.RTC PRN 2 to 3 weeks. KYPHOPLASTY/VERTEBROPLASTY DISCHARGE INSTRUCTIONS  Medications: (check all that apply)     Resume all home medications as before procedure.    Restart plavix tomorrow,              Continue your pain medications as prescribed as needed.  Over the next 3-5 days, decrease your pain medication as tolerated.  Over the counter medications (i.e. Tylenol, ibuprofen, and aleve) may be substituted once severe/moderate pain symptoms have subsided.   Wound Care: - Bandages may be removed the day following your procedure.  You may get your incision wet once bandages are removed.  Bandaids may be used to cover the incisions until scab formation.  Topical ointments are optional.  - If you develop a fever greater than 101 degrees, have increased skin redness at the incision sites or pus-like oozing from incisions occurring within 1 week of the procedure, contact radiology at [phone] or [phone].  - Ice pack to back for 15-20 minutes 2-3 time per day for first 2-3 days post procedure.  The ice will expedite muscle healing and help with the pain from the incisions.   Activity: - Bedrest today with limited activity for 24 hours post procedure.  - No driving for 48 hours.  - Increase your activity as tolerated after bedrest (with assistance if necessary).  - Refrain from any strenuous activity or heavy lifting (greater than 10 lbs.).   Follow up: -   - If a biopsy was performed at the time of your procedure, your referring physician should receive the results in usually 2-3 days.

## 2019-06-16 ENCOUNTER — Encounter (HOSPITAL_COMMUNITY): Payer: Self-pay | Admitting: Interventional Radiology

## 2019-06-18 ENCOUNTER — Other Ambulatory Visit (INDEPENDENT_AMBULATORY_CARE_PROVIDER_SITE_OTHER): Payer: Self-pay | Admitting: Specialist

## 2019-07-05 ENCOUNTER — Other Ambulatory Visit (INDEPENDENT_AMBULATORY_CARE_PROVIDER_SITE_OTHER): Payer: Self-pay | Admitting: Specialist

## 2019-07-16 ENCOUNTER — Ambulatory Visit (INDEPENDENT_AMBULATORY_CARE_PROVIDER_SITE_OTHER): Payer: Medicare Other | Admitting: Specialist

## 2019-07-16 ENCOUNTER — Ambulatory Visit: Payer: Self-pay

## 2019-07-16 ENCOUNTER — Encounter: Payer: Self-pay | Admitting: Specialist

## 2019-07-16 VITALS — BP 134/79 | HR 73 | Ht 60.0 in | Wt 175.0 lb

## 2019-07-16 DIAGNOSIS — M5416 Radiculopathy, lumbar region: Secondary | ICD-10-CM | POA: Diagnosis not present

## 2019-07-16 DIAGNOSIS — S32040S Wedge compression fracture of fourth lumbar vertebra, sequela: Secondary | ICD-10-CM | POA: Diagnosis not present

## 2019-07-16 DIAGNOSIS — G8929 Other chronic pain: Secondary | ICD-10-CM | POA: Diagnosis not present

## 2019-07-16 DIAGNOSIS — M545 Low back pain, unspecified: Secondary | ICD-10-CM

## 2019-07-16 DIAGNOSIS — M4316 Spondylolisthesis, lumbar region: Secondary | ICD-10-CM

## 2019-07-16 MED ORDER — TRAMADOL-ACETAMINOPHEN 37.5-325 MG PO TABS: ORAL_TABLET | ORAL | 0 refills | Status: DC

## 2019-07-16 NOTE — Progress Notes (Signed)
Office Visit Note   Patient: [name] [name]           Date of Birth: 1929-12-28           MRN: [id number] Visit Date: 07/16/2019              Requested by: Sandre Kittylson, Daniel K, MD [address] [place] [id number] PCP: Sandre Kittylson, Daniel K, MD   Assessment & Plan: Visit Diagnoses:  1. Chronic left-sided low back pain, unspecified whether sciatica present   2. Subacute left lumbar radiculopathy   3. Closed compression fracture of L4 lumbar vertebra, sequela   4. Spondylolisthesis, lumbar region     Plan:Avoid bending, stooping and avoid lifting weights greater than 10 lbs. Avoid prolong standing and walking. Avoid frequent bending and stooping  No lifting greater than 10 lbs. May use ice or moist heat for pain. Weight loss is of benefit. Handicap license is approved. Dr. Shen[place]ah BlasNewton's secretary/Assistant will call to arrange for epidural steroid injection  Use walker for ambulation Tramadol for pain.   Follow-Up Instructions: Return in about 3 weeks (around 08/06/2019).   Orders:  Orders Placed This Encounter  Procedures  . XR Lumbar Spine 2-3 Views  . Ambulatory referral to Physical Medicine Rehab   Meds ordered this encounter  Medications  . traMADol-acetaminophen (ULTRACET) 37.5-325 MG tablet    Sig: Take one tablet po up to every 6 hours    Dispense:  50 tablet    Refill:  0    Not to exceed 5 additional fills before 10/13/2019      Procedures: No procedures performed   Clinical Data: No additional findings.   Subjective: Chief Complaint  Patient presents with  . Lower Back - Pain    85 year old female with history of lumbar L1 compression fracture status post kyphoplasty vertebroplasty 06/15/2019. She reports the pain is about the same with pain with bending and stooping and lifting. She has difficulty sitting at the commode. She has some numbness left lateral thigh but no paresthesias. The left leg feels weak, left side only is painful. The right  side is weak due to residual of a stroke sometime ago. She is able to void and no bowel difficulty. She takes tramadol one or 2 per day. Gabapentin is not as helpful. She stays mainly in the house, she sits in the car while husband goes in. She walks with a walker inside the house.    Review of Systems  Constitutional: Positive for activity change. Negative for appetite change, chills, diaphoresis, fatigue, fever and unexpected weight change.  HENT: Negative.  Negative for congestion, dental problem, drooling, ear discharge, ear pain, facial swelling, hearing loss, mouth sores, nosebleeds, postnasal drip, rhinorrhea, sinus pressure, sinus pain, sneezing, sore throat, tinnitus, trouble swallowing and voice change.   Eyes: Negative.  Negative for photophobia, pain, discharge, redness, itching and visual disturbance.  Respiratory: Negative.  Negative for apnea, cough, choking, chest tightness, shortness of breath, wheezing and stridor.   Cardiovascular: Negative for chest pain, palpitations and leg swelling.  Gastrointestinal: Negative.  Negative for abdominal distention, abdominal pain, anal bleeding, blood in stool, constipation, diarrhea, nausea, rectal pain and vomiting.  Endocrine: Negative for cold intolerance, heat intolerance, polydipsia, polyphagia and polyuria.  Genitourinary: Negative.  Negative for difficulty urinating, dyspareunia, dysuria, enuresis, flank pain, frequency, hematuria and urgency.  Musculoskeletal: Positive for back pain and gait problem. Negative for arthralgias, joint swelling, myalgias, neck pain and neck stiffness.  Skin: Negative.  Allergic/Immunologic: Negative for environmental allergies, food allergies and immunocompromised state.  Neurological: Positive for tremors, weakness and numbness. Negative for dizziness, seizures, syncope, facial asymmetry, speech difficulty, light-headedness and headaches.  Hematological: Negative.   Psychiatric/Behavioral: Negative.   Negative for agitation, behavioral problems, confusion, decreased concentration, dysphoric mood, hallucinations, self-injury, sleep disturbance and suicidal ideas. The patient is not nervous/anxious and is not hyperactive.      Objective: Vital Signs: BP 134/79   Pulse 73   Ht 5' (1.524 m)   Wt 175 lb (79.4 kg)   BMI 34.18 kg/m   Physical Exam Constitutional:      Appearance: She is well-developed.  HENT:     Head: Normocephalic and atraumatic.  Eyes:     Pupils: Pupils are equal, round, and reactive to light.  Neck:     Musculoskeletal: Normal range of motion and neck supple.  Pulmonary:     Effort: Pulmonary effort is normal.     Breath sounds: Normal breath sounds.  Abdominal:     General: Bowel sounds are normal.     Palpations: Abdomen is soft.  Skin:    General: Skin is warm and dry.  Neurological:     Mental Status: She is alert and oriented to person, place, and time.  Psychiatric:        Behavior: Behavior normal.        Thought Content: Thought content normal.        Judgment: Judgment normal.     Back Exam   Tenderness  The patient is experiencing tenderness in the lumbar.  Range of Motion  Extension: abnormal  Flexion: abnormal  Lateral bend right: 60  Lateral bend left:  40 abnormal  Rotation right: 60  Rotation left: 30   Muscle Strength  Right Quadriceps:  5/5  Left Quadriceps:  4/5  Right Hamstrings:  5/5  Left Hamstrings:  5/5   Tests  Straight leg raise right: negative Straight leg raise left: positive  Reflexes  Patellar: 0/4 Achilles: 1/4 Babinski's sign: normal   Other  Toe walk: abnormal Heel walk: abnormal Sensation: decreased Gait: abnormal  Erythema: no back redness Scars: absent  Comments:  Pain with palpation left buttock and left lateral buttock and with palpation of left sacrum.        Specialty Comments:  No specialty comments available.  Imaging: No results found.   PMFS History: Patient Active  Problem List   Diagnosis Date Noted  . History of CVA (cerebrovascular accident) 04/26/2019  . CHF (congestive heart failure), NYHA class III, chronic, diastolic (HCC) 11/11/2018  . Hypoxemia requiring supplemental oxygen 11/10/2018  . Fatigue 11/10/2018  . Osteopenia of multiple sites 11/10/2018  . Acute flank pain 08/07/2018  . Umbilical pain 10/31/2017  . Back pain 10/10/2016  . Pelvic pain 10/10/2016  . Seizures (HCC) 08/06/2014  . Abnormal involuntary movement 08/05/2014  . Hemiplegia, unspecified, affecting dominant side 08/05/2014  . Right sided weakness 08/05/2014  . CVA (cerebral vascular accident) (HCC) 07/21/2014  . Left leg pain 07/20/2014  . HTN (hypertension) 07/18/2014  . HLD (hyperlipidemia) 07/18/2014  . Thrombocytopenia (HCC) 07/18/2014  . Essential tremor 10/27/2013  . Obesity, unspecified 10/27/2013  . DM2 (diabetes mellitus, type 2) (HCC) 10/27/2013  . History of peripheral neuropathy 10/27/2013   Past Medical History:  Diagnosis Date  . Anxiety   . Diabetes (HCC) 10/27/2013  . DVT (deep venous thrombosis) (HCC)   . Essential tremor 10/27/2013  . High cholesterol 1999  . Hypertension   .  Ischemic stroke ([facility]) 07/18/2014  . Obesity, unspecified 10/27/2013  . Pneumonia   . Unspecified hereditary and idiopathic peripheral neuropathy 10/27/2013    Family History  Problem Relation Age of Onset  . Stroke Mother   . Heart Problems Brother   . Coronary artery disease Other   . Heart Problems Sister     Past Surgical History:  Procedure Laterality Date  . CATARACT EXTRACTION Bilateral   . FOOT SURGERY    . IR KYPHO LUMBAR INC FX REDUCE BONE BX UNI/BIL CANNULATION INC/IMAGING  06/15/2019   Social History   Occupational History    Comment: does not work  Tobacco Use  . Smoking status: Never Smoker  . Smokeless tobacco: Never Used  Substance and Sexual Activity  . Alcohol use: No    Alcohol/week: 0.0 standard drinks  . Drug use: No  . Sexual activity:  Never

## 2019-07-16 NOTE — Patient Instructions (Signed)
Avoid bending, stooping and avoid lifting weights greater than 10 lbs. Avoid prolong standing and walking. Avoid frequent bending and stooping  No lifting greater than 10 lbs. May use ice or moist heat for pain. Weight loss is of benefit. Handicap license is approved. Dr. Romona Curls secretary/Assistant will call to arrange for epidural steroid injection  Use walker for ambulation Tramadol for pain.

## 2019-08-09 ENCOUNTER — Encounter: Payer: Self-pay | Admitting: Physical Medicine and Rehabilitation

## 2019-08-09 ENCOUNTER — Ambulatory Visit (INDEPENDENT_AMBULATORY_CARE_PROVIDER_SITE_OTHER): Payer: Medicare Other | Admitting: Physical Medicine and Rehabilitation

## 2019-08-09 ENCOUNTER — Ambulatory Visit: Payer: Self-pay

## 2019-08-09 VITALS — BP 144/74 | HR 87

## 2019-08-09 DIAGNOSIS — M5416 Radiculopathy, lumbar region: Secondary | ICD-10-CM

## 2019-08-09 MED ORDER — BETAMETHASONE SOD PHOS & ACET 6 (3-3) MG/ML IJ SUSP
12.0000 mg | Freq: Once | INTRAMUSCULAR | Status: AC
  Administered 2019-08-09: 10:00:00 12 mg

## 2019-08-09 NOTE — Procedures (Signed)
Lumbosacral Transforaminal Epidural Steroid Injection - Sub-Pedicular Approach with Fluoroscopic Guidance  Patient: [name] [name]      Date of Birth: [birth year] MRN: [id number] PCP: Benay Pike, MD      Visit Date: 08/09/2019   Universal Protocol:    Date/Time: 08/09/2019  Consent Given By: the patient  Position: PRONE  Additional Comments: Vital signs were monitored before and after the procedure. Patient was prepped and draped in the usual sterile fashion. The correct patient, procedure, and site was verified.   Injection Procedure Details:  Procedure Site One Meds Administered:  Meds ordered this encounter  Medications  . betamethasone acetate-betamethasone sodium phosphate (CELESTONE) injection 12 mg    Laterality: Left  Location/Site:  L4-L5  Needle size: 22 G  Needle type: Spinal  Needle Placement: Transforaminal  Findings:    -Comments: Excellent flow of contrast along the nerve and into the epidural space.  Procedure Details: After squaring off the end-plates to get a true AP view, the C-arm was positioned so that an oblique view of the foramen as noted above was visualized. The target area is just inferior to the "nose of the scotty dog" or sub pedicular. The soft tissues overlying this structure were infiltrated with 2-3 ml. of 1% Lidocaine without Epinephrine.  The spinal needle was inserted toward the target using a "trajectory" view along the fluoroscope beam.  Under AP and lateral visualization, the needle was advanced so it did not puncture dura and was located close the 6 O'Clock position of the pedical in AP tracterory. Biplanar projections were used to confirm position. Aspiration was confirmed to be negative for CSF and/or blood. A 1-2 ml. volume of Isovue-250 was injected and flow of contrast was noted at each level. Radiographs were obtained for documentation purposes.   After attaining the desired flow of contrast documented above, a 0.5 to  1.0 ml test dose of 0.25% Marcaine was injected into each respective transforaminal space.  The patient was observed for 90 seconds post injection.  After no sensory deficits were reported, and normal lower extremity motor function was noted,   the above injectate was administered so that equal amounts of the injectate were placed at each foramen (level) into the transforaminal epidural space.   Additional Comments:  The patient tolerated the procedure well Dressing: 2 x 2 sterile gauze and Band-Aid    Post-procedure details: Patient was observed during the procedure. Post-procedure instructions were reviewed.  Patient left the clinic in stable condition.

## 2019-08-09 NOTE — Progress Notes (Signed)
 .  Numeric Pain Rating Scale and Functional Assessment Average Pain 9   In the last MONTH (on 0-10 scale) has pain interfered with the following?  1. General activity like being  able to carry out your everyday physical activities such as walking, climbing stairs, carrying groceries, or moving a chair?  Rating(10)   +Driver,-BT(plavix, ok for inj), -Dye Allergies.

## 2019-08-09 NOTE — Progress Notes (Signed)
[name] [name] - 85 y.o. female MRN [id number]  Date of birth: September 26, 1930  Office Visit Note: Visit Date: 08/09/2019 PCP: Benay Pike, MD Referred by: Benay Pike, MD  Subjective: Chief Complaint  Patient presents with  . Lower Back - Pain  . Left Hip - Pain   HPI: [name] [name] is a 85 y.o. female who comes in today At the request of Dr. Basil Dess for left L4 transforaminal epidural steroid injection diagnostically and hopefully therapeutically.  I have seen the patient in the past for similar injection.  The last time I saw her was in April 2019.  Her case is very complicated by history of stroke and congestive heart failure and diabetes type 2.  She has had some hemiparesis on the right.  She comes in today with lower back pain mostly on the left side radiates into the buttock and left hip she also endorses some groin pain.  She states this is been ongoing for a few months.  She says that walking standing make the pain worse.  She rates her pain as a 9 out of 10.  Since I have seen her she has had compression fracture at L4 with kyphoplasty performed by Dr. Estanislado Pandy.  On the left side there was some extravasation of methylmethacrylate into the paraspinal space.  Patient has had new MRI prior to the kyphoplasty and this is reviewed below.  Has significant facet arthropathy.  ROS Otherwise per HPI.  Assessment & Plan: Visit Diagnoses:  1. Lumbar radiculopathy     Plan: No additional findings.   Meds & Orders:  Meds ordered this encounter  Medications  . betamethasone acetate-betamethasone sodium phosphate (CELESTONE) injection 12 mg    Orders Placed This Encounter  Procedures  . XR C-ARM NO REPORT  . Epidural Steroid injection    Follow-up: No follow-ups on file.   Procedures: No procedures performed  Lumbosacral Transforaminal Epidural Steroid Injection - Sub-Pedicular Approach with Fluoroscopic Guidance  Patient: [name] [name]      Date of Birth:  11-05-1930 MRN: [id number] PCP: Benay Pike, MD      Visit Date: 08/09/2019   Universal Protocol:    Date/Time: 08/09/2019  Consent Given By: the patient  Position: PRONE  Additional Comments: Vital signs were monitored before and after the procedure. Patient was prepped and draped in the usual sterile fashion. The correct patient, procedure, and site was verified.   Injection Procedure Details:  Procedure Site One Meds Administered:  Meds ordered this encounter  Medications  . betamethasone acetate-betamethasone sodium phosphate (CELESTONE) injection 12 mg    Laterality: Left  Location/Site:  L4-L5  Needle size: 22 G  Needle type: Spinal  Needle Placement: Transforaminal  Findings:    -Comments: Excellent flow of contrast along the nerve and into the epidural space.  Procedure Details: After squaring off the end-plates to get a true AP view, the C-arm was positioned so that an oblique view of the foramen as noted above was visualized. The target area is just inferior to the "nose of the scotty dog" or sub pedicular. The soft tissues overlying this structure were infiltrated with 2-3 ml. of 1% Lidocaine without Epinephrine.  The spinal needle was inserted toward the target using a "trajectory" view along the fluoroscope beam.  Under AP and lateral visualization, the needle was advanced so it did not puncture dura and was located close the 6 O'Clock position of the pedical in AP tracterory. Biplanar projections were used  to confirm position. Aspiration was confirmed to be negative for CSF and/or blood. A 1-2 ml. volume of Isovue-250 was injected and flow of contrast was noted at each level. Radiographs were obtained for documentation purposes.   After attaining the desired flow of contrast documented above, a 0.5 to 1.0 ml test dose of 0.25% Marcaine was injected into each respective transforaminal space.  The patient was observed for 90 seconds post injection.  After  no sensory deficits were reported, and normal lower extremity motor function was noted,   the above injectate was administered so that equal amounts of the injectate were placed at each foramen (level) into the transforaminal epidural space.   Additional Comments:  The patient tolerated the procedure well Dressing: 2 x 2 sterile gauze and Band-Aid    Post-procedure details: Patient was observed during the procedure. Post-procedure instructions were reviewed.  Patient left the clinic in stable condition.     Clinical History: MRI LUMBAR SPINE WITHOUT CONTRAST  TECHNIQUE: Multiplanar, multisequence MR imaging of the lumbar spine was performed. No intravenous contrast was administered.  COMPARISON:  Lumbar CT 05/17/2019  FINDINGS: Segmentation:  5 lumbar type vertebrae  Alignment:  Degenerative grade 1 anterolisthesis at L4-5  Vertebrae: Trace STIR hyperintensity in a band following the L4 superior endplate fracture, nearly completely healed. Height loss is mild. L1 moderate remote compression fracture with no marrow edema. No acute fracture is seen. No evidence of aggressive bone lesion  Conus medullaris and cauda equina: Conus extends to the L1-2 level. Conus and cauda equina appear normal.  Paraspinal and other soft tissues: Negative  Disc levels:  T12- L1: Degenerative disc narrowing with mild ridging and bulging. No impingement  L1-L2: Disc narrowing and bulging with a right foraminal protrusion. Mild posterior element hypertrophy. No impingement  L2-L3: Disc narrowing and bulging. Mild posterior element hypertrophy. No impingement  L3-L4: Disc narrowing and bulging. Mild posterior element hypertrophy. No impingement  L4-L5: Advanced facet degeneration with anterolisthesis. The disc is narrowed and desiccated. No impingement  L5-S1:Advanced facet spurring. The disc is narrowed and bulging with far-lateral endplate spurring. No impingement   IMPRESSION: 1. Nonacute L4 superior endplate fracture with trace residual marrow edema. The fracture is nearly completely healed. 2. Remote L1 compression fracture. 3. No acute fracture. 4. Generalized degenerative disease as described. No high-grade stenosis.   Electronically Signed   By: Marnee SpringJonathon  Watts M.D.   On: 05/27/2019 04:16   She reports that she has never smoked. She has never used smokeless tobacco.  Recent Labs    12/17/18 1556  HGBA1C 5.3    Objective:  VS:  HT:    WT:   BMI:     BP:(!) 144/74  HR:87bpm  TEMP: ( )  RESP:  Physical Exam  Ortho Exam Imaging: No results found.  Past Medical/Family/Surgical/Social History: Medications & Allergies reviewed per EMR, new medications updated. Patient Active Problem List   Diagnosis Date Noted  . History of CVA (cerebrovascular accident) 04/26/2019  . CHF (congestive heart failure), NYHA class III, chronic, diastolic (HCC) 11/11/2018  . Hypoxemia requiring supplemental oxygen 11/10/2018  . Fatigue 11/10/2018  . Osteopenia of multiple sites 11/10/2018  . Acute flank pain 08/07/2018  . Umbilical pain 10/31/2017  . Back pain 10/10/2016  . Pelvic pain 10/10/2016  . Seizures (HCC) 08/06/2014  . Abnormal involuntary movement 08/05/2014  . Hemiplegia, unspecified, affecting dominant side 08/05/2014  . Right sided weakness 08/05/2014  . CVA (cerebral vascular accident) (HCC) 07/21/2014  . Left leg  pain 07/20/2014  . HTN (hypertension) 07/18/2014  . HLD (hyperlipidemia) 07/18/2014  . Thrombocytopenia (HCC) 07/18/2014  . Essential tremor 10/27/2013  . Obesity, unspecified 10/27/2013  . DM2 (diabetes mellitus, type 2) (HCC) 10/27/2013  . History of peripheral neuropathy 10/27/2013   Past Medical History:  Diagnosis Date  . Anxiety   . Diabetes (HCC) 10/27/2013  . DVT (deep venous thrombosis) (HCC)   . Essential tremor 10/27/2013  . High cholesterol 1999  . Hypertension   . Ischemic stroke (HCC) 07/18/2014   . Obesity, unspecified 10/27/2013  . Pneumonia   . Unspecified hereditary and idiopathic peripheral neuropathy 10/27/2013   Family History  Problem Relation Age of Onset  . Stroke Mother   . Heart Problems Brother   . Coronary artery disease Other   . Heart Problems Sister    Past Surgical History:  Procedure Laterality Date  . CATARACT EXTRACTION Bilateral   . FOOT SURGERY    . IR KYPHO LUMBAR INC FX REDUCE BONE BX UNI/BIL CANNULATION INC/IMAGING  06/15/2019   Social History   Occupational History    Comment: does not work  Tobacco Use  . Smoking status: Never Smoker  . Smokeless tobacco: Never Used  Substance and Sexual Activity  . Alcohol use: No    Alcohol/week: 0.0 standard drinks  . Drug use: No  . Sexual activity: Never

## 2019-08-13 ENCOUNTER — Other Ambulatory Visit: Payer: Self-pay

## 2019-08-13 MED ORDER — ATORVASTATIN CALCIUM 20 MG PO TABS: 20.0000 mg | ORAL_TABLET | Freq: Every evening | ORAL | 3 refills | Status: DC

## 2019-08-30 ENCOUNTER — Other Ambulatory Visit: Payer: Self-pay

## 2019-09-01 MED ORDER — LIDOCAINE 5 % EX PTCH: 1.0000 | MEDICATED_PATCH | CUTANEOUS | 0 refills | Status: DC

## 2019-09-12 ENCOUNTER — Other Ambulatory Visit: Payer: Self-pay | Admitting: Neurology

## 2019-09-12 DIAGNOSIS — E0842 Diabetes mellitus due to underlying condition with diabetic polyneuropathy: Secondary | ICD-10-CM

## 2019-09-26 ENCOUNTER — Other Ambulatory Visit: Payer: Self-pay | Admitting: Neurology

## 2019-09-26 DIAGNOSIS — G40109 Localization-related (focal) (partial) symptomatic epilepsy and epileptic syndromes with simple partial seizures, not intractable, without status epilepticus: Secondary | ICD-10-CM

## 2019-10-22 ENCOUNTER — Telehealth: Payer: Self-pay | Admitting: Family Medicine

## 2019-10-22 NOTE — Telephone Encounter (Signed)
I have to sign paperwork for this patient certifying their need for oxygen at home. It says I must have seen them within 90 days prior to signing this.  I have not seen her at all.  Can you please let patient know they need to schedule an appointment before I can sign it? I believe it can be virtual if she is concerned about getting coronavirus.

## 2019-10-27 ENCOUNTER — Other Ambulatory Visit: Payer: Self-pay

## 2019-10-27 ENCOUNTER — Encounter: Payer: Self-pay | Admitting: Neurology

## 2019-10-27 ENCOUNTER — Ambulatory Visit (INDEPENDENT_AMBULATORY_CARE_PROVIDER_SITE_OTHER): Payer: Medicare Other | Admitting: Neurology

## 2019-10-27 ENCOUNTER — Telehealth: Payer: Self-pay | Admitting: Family Medicine

## 2019-10-27 VITALS — BP 131/79 | HR 80 | Temp 97.5°F | Ht 60.0 in | Wt 146.0 lb

## 2019-10-27 DIAGNOSIS — R569 Unspecified convulsions: Secondary | ICD-10-CM

## 2019-10-27 DIAGNOSIS — E0842 Diabetes mellitus due to underlying condition with diabetic polyneuropathy: Secondary | ICD-10-CM

## 2019-10-27 DIAGNOSIS — G250 Essential tremor: Secondary | ICD-10-CM | POA: Diagnosis not present

## 2019-10-27 DIAGNOSIS — Z8673 Personal history of transient ischemic attack (TIA), and cerebral infarction without residual deficits: Secondary | ICD-10-CM

## 2019-10-27 MED ORDER — GABAPENTIN 100 MG PO CAPS: 100.0000 mg | ORAL_CAPSULE | Freq: Every day | ORAL | 3 refills | Status: DC

## 2019-10-27 MED ORDER — PRIMIDONE 250 MG PO TABS: 250.0000 mg | ORAL_TABLET | Freq: Two times a day (BID) | ORAL | 3 refills | Status: DC

## 2019-10-27 NOTE — Progress Notes (Signed)
Subjective:    Patient ID: [name] [name] is a 85 y.o. female.  HPI     Interim history:   [name] [name] is a very pleasant 86 year old [name] lady with an underlying medical history of hypertension, hyperlipidemia, DVT, pneumonia, anxiety, obesity, diabetes, peripheral neuropathy, stroke and essential tremor, who presents for follow-up consultation of her essential tremor and history of seizure, history of stroke. The patient is accompanied by her son again today. I last saw her on 09/07/2018, at which time she was stable.  She had issues with back pain, she had been found to have osteopenia.  She had no recent falls.  She has had right-sided residual weakness from her stroke.  Her tremor has been somewhat progressive.  She had no recent seizures.   She saw Debbora Presto, nurse practitioner in the interim with a phone call visit on 04/26/2019, at which time she was stable.   Today, 10/27/2019: She reports feeling fairly stable, tremor gets worse sometimes on the left more than right but right-sided weakness sometimes seems a little worse than others especially in her leg and foot.  She uses a 2 wheeled walker, thankfully no recent falls, also no recent seizure.  Sometimes she feels jittery towards the mealtimes, but her blood sugar levels have been in the 80s to 120s.  She has not had a recent checkup with her primary care.    Previously:   I saw her on 07/08/2017 for a sooner than scheduled appointment because she felt that the tremor was worse, she also had intermittent leg cramps. She had issues with low back pain. She had a recent lumbar spine MRI. She was supposed to see Dr. Ernestina Patches for injection into the spine. She was advised to continue with low-dose gabapentin, Mysoline twice daily, and Keppra twice daily.     I saw her on 11/11/2016, at which time her tremor was mostly stable, she had no recent falls. Her right leg weakness was about the same as well. She did present to the emergency room in  November 2017 for back pain and leg pain. She was found to have a UTI.  She was also found on CT lumbar spine to have a L1 vertebral body fracture, old.    She was seen in the interim by Ward Givens on 05/13/2017, at which time she was noted to be fairly stable and her medications were kept the same.   She presented in the interim to the emergency room on 05/31/2017 for hip pain on the left. She saw her family physician on 07/02/2017.   I saw her on 05/08/2016, at which time she had recently fallen about 10 days prior. She had visited her grandmother daughter who is expecting a baby and fell backwards landed on her back. She had left-sided back pain since then. She went to the emergency room on 05/04/2016 and was given an injection with Decadron, Toradol, Dilaudid, but did not have much in way of relief. She had a bone scan on 05/06/16, which I reviewed: IMPRESSION: 1. No evidence of a recent fracture. 2. No evidence of neoplastic disease to bone. 3. Significant degenerative uptake noted along spine as well as involving multiple bilateral joints. She reported that her tremor was worse. She had more stress and also more pain.   I suggested she continue with her Keppra, Plavix, Mysoline and Neurontin.   I saw her on 09/25/15, at which time she felt she was doing well. She was worried about her brother in  [place]. She was not able to travel to [place] and was sad about it. She was planning to fly to [place] to be with her daughter and other family for some time. No recent seizures or strokelike symptoms were reported. Tremor was stable. She was using a cane and had no recent falls. I suggested we continue with Keppra, Plavix, gabapentin and Mysoline at the same doses.   I saw her on 04/26/2015 at which time she reported difficulty with her balance. Her tremor had become a little worse. She had had no recent seizures or auras. She was able to tolerate Keppra 500 mg twice daily. She was on the same dose  of Mysoline for her tremors. She had benefited from physical therapy at [facility] and requested to go back for outpatient physical therapy. She had not fallen. She was using a 4 pronged cane and was no longer using her walker.   I saw her on 11/16/2014, at which time her son reported that she was doing well. She was in outpatient physical therapy and was using a 2 wheeled walker. She was taking gabapentin at a lower dose. Lowering gabapentin had improved her heavy headedness in the morning. Her tremor was stable. She was tolerating Keppra 500 mg twice daily with no recent seizures or auras reported. Overall she has done well. I continued her on the same dose of primidone, the same dose of Keppra, and suggested we reduce her gabapentin further to 100 mg in the morning and 200 mg in the evening.    Of note, they canceled an appointment for 03/17/2015.   I saw her on 08/19/2014 at which time she presented after her hospitalization for stroke. Her son reported that Aggrenox was causing her headaches and she was switched to Plavix. We talked about sleep apnea screening. She had no witnessed apneas and only mild snoring. She had done fairly well. They requested a referral to outpatient therapy in [facility]. She was placed on Keppra for episodic twitching. She had had some medication changes when she was hospitalized for her stroke. She was advised to use her 2 wheeled walker with assistance. I referred her to physical therapy outpatient. We talked about secondary stroke prevention. I suggested she reduce gabapentin to 200 mg twice daily down from 300 mg twice daily because of dizziness and head heaviness reported. This continued on Mysoline for her essential tremor.   I saw her on 10/27/2013 at which time she presented for followup of her long-standing history of essential tremor. I felt she was stable in that regard and felt that her diabetes had resulted in diabetic neuropathy. I asked her to continue  with Mysoline at the same strength and increased her gabapentin to 3 pills at night for a total of 300 mg each bedtime.   In the interim she presented to the emergency room on 07/18/2014 with new onset right-sided weakness, facial droop, slurring of speech which started around 6 PM the night before. She was in [place], [place] visiting family when she had acute onset of the symptoms. She was seen in [place] at the hospital and CT head was negative for bleed but she did not receive TPA because of low platelet count of 90,000. She had improvement of her symptoms but persistent deficits. She was admitted to the hospital and had a complete stroke workup. She had an MRI of the brain as well as MRA head and was found to have a left thalamic ischemic stroke. Neurology was  consulted. MRA showed left PCA occlusion. Echocardiogram showed an EF of 60-65% with no wall motion abnormalities and no source of emboli. Carotid Doppler studies were negative for any significant ICA stenosis (1-39%). Hb A1c was 6. LDL was 62. She was placed on aspirin for secondary stroke prevention and then changed to Aggrenox. Blood work showed normal B12 levels, negative hepatitis serology, chest x-ray was negative. EKG did not show any ST abnormalities. She had some left leg pain. Lower extremity Doppler studies from 07/20/2014 were negative for DVT. She was discharged on 07/21/2014 to inpatient rehabilitation. She was discharged from rehabilitation on 08/02/2014. I reviewed hospital records as well as imaging tests. Her daughter called and asked for a sooner than scheduled appointment because of worsening weakness. However, she missed an appointment on 08/05/2014 because she was readmitted to the hospital on 08/04/2014 for slurring of speech and shaking spells. I reviewed the hospital records. She had an EEG, which was negative for seizure activity but she was felt to have partial onset seizures and was started on Keppra which is  currently at 500 mg twice daily. She was changed from Aggrenox to Plavix 75 mg.   She had a brain MRI without contrast on 08/05/2014: 1. Persistent 1.1 cm focus of linear restricted diffusion within the lateral left thalamus near the posterior limb of the left internal capsule. Finding is favored to reflect resolving infarct from the 07/19/2014 study given the relative decreased signal intensity and size from that prior exam, although possible acute re-infarction of this area is not entirely excluded. 2. No other acute intracranial abnormality. In addition, I personally reviewed the images through the PACS system. She had an EEG on 08/06/2014: This was reported as normal in the asleep and awake states. Her son called on 08/15/2014 to make an appointment.   I first met her on 01/19/2013, at which time I increased her Mysoline 250 mg strength 1-1/2 pills to 2 pills daily. I did advise her of the potential increase risk of side effects. She previously followed with Dr. Morene Antu. She has a billateral UE and head tremor for over 25 years. She was tried on primidone while she was in [place] with improvement in tremor. It was discontinued when she came to the [place] because she was on Coumadin. Her tremor affects her ability to feed herself, write, and put on her makeup and is worse with anxiety. Her daughter has a tremor as well. She was restarted on primidone by Dr. Erling Cruz in 3/12. She is no longer on coumadin. She was on coumadin for a history of DVT in her left leg following left foot injury. She has had numbness in her legs without pain. EMG/NCV 02/27/11 was normal except for some denervation in the left lumbosacral paraspinal muscles. B12 level was normal. She is on gabapentin 100 mg 2 at night with relief of symptoms. Her major complaint is that of ongoing tremor, which is worse in the morning. She has not had side effects such as grogginess or difficulty walking with the primidone. She no longer cooks. Her  tremor involves her writing and abilities to feed herself. She also continues to take gabapentin 112m 2 qHS for her neuropathy. She has not fallen per son. She was supposed to come back in 3 months but missed several appointments. She has been diagnosed with DM and has been started on metforim. She feels, her numbness and tingling is worse in her feet and she has now has started noticing burning sensation  in her feet.  His Past Medical History Is Significant For: Past Medical History:  Diagnosis Date  . Anxiety   . Diabetes ([place]) 10/27/2013  . DVT (deep venous thrombosis) ([place])   . Essential tremor 10/27/2013  . High cholesterol 1999  . Hypertension   . Ischemic stroke ([place]) 07/18/2014  . Obesity, unspecified 10/27/2013  . Pneumonia   . Unspecified hereditary and idiopathic peripheral neuropathy 10/27/2013    Her Past Surgical History Is Significant For: Past Surgical History:  Procedure Laterality Date  . CATARACT EXTRACTION Bilateral   . FOOT SURGERY    . IR KYPHO LUMBAR INC FX REDUCE BONE BX UNI/BIL CANNULATION INC/IMAGING  06/15/2019    Her Family History Is Significant For: Family History  Problem Relation Age of Onset  . Stroke Mother   . Heart Problems Brother   . Coronary artery disease Other   . Heart Problems Sister     Her Social History Is Significant For: Social History   Socioeconomic History  . Marital status: Widowed    Spouse name: Not on file  . Number of children: 5  . Years of education: 9  . 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 Financial resource strain: Not on file  . Food insecurity    Worry: Not on file    Inability: Not on file  . Transportation needs    Medical: Not on file    Non-medical: Not on file  Tobacco Use  . Smoking status: Never Smoker  . Smokeless tobacco: Never Used  Substance and Sexual Activity  . Alcohol use: No    Alcohol/week: 0.0 standard drinks  . Drug  use: No  . Sexual activity: Never  Lifestyle  . Physical activity    Days per week: Not on file    Minutes per session: Not on file  . Stress: Not on file  Relationships  . Social Herbalist on phone: Not on file    Gets together: Not on file    Attends religious service: Not on file    Active member of club or organization: Not on file    Attends meetings of clubs or organizations: Not on file    Relationship status: Not on file  Other Topics Concern  . Not on file  Social History Narrative   Patient is right handed and resides with son    Her Allergies Are:  Allergies  Allergen Reactions  . Tape Rash    Please do not use Plastic Tape  :   Her Current Medications Are:  Outpatient Encounter Medications as of 10/27/2019  Medication Sig  . acetaminophen (TYLENOL) 325 MG tablet Take 2 tablets (650 mg total) by mouth every 6 (six) hours as needed.  . ALPRAZolam (XANAX) 0.25 MG tablet Take 0.25 mg by mouth 2 (two) times daily as needed for anxiety.   [name] amLODipine (NORVASC) 2.5 MG tablet Take 2.5 mg by mouth daily.  [name] atorvastatin (LIPITOR) 20 MG tablet Take 1 tablet (20 mg total) by mouth every evening.  [name] CALCIUM PO Take 1 tablet by mouth daily.  . Cholecalciferol (VITAMIN D PO) Take 1 tablet by mouth daily.  . clopidogrel (PLAVIX) 75 MG tablet TAKE 1 TABLET (75 MG TOTAL) BY MOUTH DAILY.  [name] diclofenac sodium (VOLTAREN) 1 % GEL APPLY 2 GRAMS TOPICALLY 4 (FOUR) TIMES DAILY. (Patient taking differently: Apply 1 application topically 3 (three) times  daily as needed (pain). )  . DULoxetine (CYMBALTA) 20 MG capsule TAKE 1 CAPSULE BY MOUTH EVERY DAY  . furosemide (LASIX) 20 MG tablet Take 20 mg by mouth daily.  [name] gabapentin (NEURONTIN) 100 MG capsule TAKE 1 CAPSULE (100 MG TOTAL) BY MOUTH AT BEDTIME. NEED OFFICE VISIT  . glucose blood (ACCU-CHEK AVIVA) test strip Use as instructed to test once a day Dx code:E11.9  . levETIRAcetam (KEPPRA) 500 MG tablet TAKE 1 TABLET BY MOUTH  TWICE A DAY  . lidocaine (LIDODERM) 5 % Place 1 patch onto the skin daily. Remove & Discard patch within 12 hours or as directed by MD  . nystatin (MYCOSTATIN/NYSTOP) powder Apply topically 4 (four) times daily.  . Omega-3 Fatty Acids (FISH OIL) 1000 MG CAPS Take 1 capsule by mouth every other day.   [name] PAZEO 0.7 % SOLN Place 1 drop into both eyes at bedtime.   . potassium chloride SA (K-DUR,KLOR-CON) 20 MEQ tablet Take 20 mEq by mouth daily.   . primidone (MYSOLINE) 250 MG tablet Take 1 tablet (250 mg total) by mouth 2 (two) times daily.  . RESTASIS 0.05 % ophthalmic emulsion Place 1 drop into both eyes 2 (two) times daily.   . traMADol-acetaminophen (ULTRACET) 37.5-325 MG tablet Take one tablet po up to every 6 hours   No facility-administered encounter medications on file as of 10/27/2019.   :  Review of Systems:  Out of a complete 14 point review of systems, all are reviewed and negative with the exception of these symptoms as listed below: Review of Systems  Neurological:       Rm 1, son.  F/u Seizures/ L hand tremor.No seizures. Tremor stable.  L > R.     Objective:  Neurological Exam  Physical Exam Physical Examination:   Vitals:   10/27/19 1021  BP: 131/79  Pulse: 80  Temp: (!) 97.5 F (36.4 C)    General Examination: The patient is a very pleasant 85 y.o. female in no acute distress. She appears well-developed and well-nourished and well groomed.   HEENT:Normocephalic, atraumatic, pupils are equal, round and reactive to light, hearing isgrosslyintact. Extraocular tracking is good without nystagmus. She has a moderate degree of headtremor, no-no type,stable from last time. She has a mild degree of voice tremor. Sheisnot dysarthric. Oropharynx is clear. She has mild airway crowding, dentures. Neck shows FROM.    Chest:Clear to auscultation without wheezing, rhonchi or crackles noted.  Heart:S1+S2+0, regular and normal without murmurs, rubs or gallops noted.    Abdomen:Soft, non-tender and non-distended with normal bowel sounds appreciated on auscultation.  Extremities:There istrace pitting edema, right more than left around the ankles.   Skin: Warm and dry without trophic changes noted. There are no varicose veins.  Musculoskeletal: exam reveals no obvious joint deformities, tenderness or joint swelling or erythema, no obvious pain.   Neurologically:  Mental status: The patient is awake, alert and oriented in all 4 spheres. His memory, attention, language and knowledge are appropriate. There is no aphasia, agnosia, apraxia or anomia. Speech is clear with normal prosody and enunciation. Thought process is linear. Mood is congruent and affect is normal.  Cranial nerves are as described above under HEENT exam.  Motor exam:thin bulk, global strength of 4 out of 5, perhaps a little weaker in R hip flexor and Foot dorsiflexion on the right, reflexes are 1+, absent in both ankles. Fine motor skills are globally mildly impaired in the upper and lower extremities. Romberg is not tested for  safety reasons. She has a bilateral upper extremity tremors including a mild resting tremor in the right upper extremity, postural tremor both upper extremities and action tremor in the moderate degree in both upper extremities. Cerebellar testing shows no dysmetria or intention tremor.  Sensory exam: intact to light touch.  Gait, station and balance: She stands up slowlyand requires a little assistance, walks slowly and cautiously, tandem walk is not possible, balance is impaired. She has a mildly stooped posture. Uses a 2 wheeled walker.    Assessment and Plan:   In summary, Vickye B Guthridge is a very pleasant 85 year old female with a history of obesity, previous DVT, ET, stroke, partial seizurein the past,who presents for follow-up consultation of her essential tremor of many years duration, history of stroke, history of partial complex seizures. She has  been stable thankfully, no recent falls, No recent seizures, stable residual weakness, fairly stable tremor. She is tolerating her medicines. We gradually reduced her gabapentin, last year we reduced it to 100 mg at bedtime only.  She has maintained treatment for her tremor with Mysoline 250 mg twice daily and for seizure prevention she has been on Keppra 500 mg twice daily.  She is encouraged to follow-up with her primary care physician for routine checkup and also routine blood work.  I renewed her prescriptions for 90 days with refills.  I suggested a 19-monthfollow-up with the nurse practitioner.  I answered all their questions today and the patient and her son were in agreement. I spent 15 minutes in total face-to-face time with the patient, more than 50% of which was spent in counseling and coordination of care, reviewing test results, reviewing medication and discussing or reviewing the diagnosis of ET, stroke, Sz, the prognosis and treatment options. Pertinent laboratory and imaging test results that were available during this visit with the patient were reviewed by me and considered in my medical decision making (see chart for details).

## 2019-10-27 NOTE — Patient Instructions (Signed)
Your exam is stable thankfully.  We can continue with your current medications.  Please make an appointment with your primary care physician for your regular checkup.  Please look into using weighted utensils so you can feed yourself a little better.

## 2019-11-01 NOTE — Telephone Encounter (Signed)
LVM ([employer] [facility]alio Macadamia [phone]) for pt to call the office and schedule and appt. Ottis Stain, CMA

## 2019-11-01 NOTE — Telephone Encounter (Signed)
Have we reached out to her yet for an appointment? She needs to see me before I can approve the oxygen. Can be virtual visit.

## 2019-11-01 NOTE — Telephone Encounter (Signed)
I left a message on her VM. Ottis Stain, CMA

## 2019-11-10 ENCOUNTER — Other Ambulatory Visit: Payer: Self-pay

## 2019-11-10 ENCOUNTER — Ambulatory Visit (INDEPENDENT_AMBULATORY_CARE_PROVIDER_SITE_OTHER): Payer: Medicare Other | Admitting: Family Medicine

## 2019-11-10 VITALS — BP 124/75 | HR 74 | Wt 145.6 lb

## 2019-11-10 DIAGNOSIS — I639 Cerebral infarction, unspecified: Secondary | ICD-10-CM

## 2019-11-10 DIAGNOSIS — Z23 Encounter for immunization: Secondary | ICD-10-CM

## 2019-11-10 DIAGNOSIS — Z9981 Dependence on supplemental oxygen: Secondary | ICD-10-CM

## 2019-11-10 DIAGNOSIS — R531 Weakness: Secondary | ICD-10-CM | POA: Diagnosis not present

## 2019-11-10 DIAGNOSIS — R0902 Hypoxemia: Secondary | ICD-10-CM

## 2019-11-10 MED ORDER — LIDOCAINE 5 % EX PTCH: 1.0000 | MEDICATED_PATCH | CUTANEOUS | 11 refills | Status: DC

## 2019-11-10 NOTE — Patient Instructions (Addendum)
It was nice to meet you today,   I believe your leg numbness/pain is something that is not going to go away any time soon and you are already on the medications I would normally give to treat it.  As long as you can tolerate it I do not think we need to do anything more about it.  If your leg starts to change colors or starts to develop ulcers we can refer you to a vascular surgeon.    I will refill your pain patch and your oxygen today and send you in a prescription for a new walker.   I would like to see you back in 6 months.  You can make an appointment sooner if you need to.    Have a great day,   Clemetine Marker, MD

## 2019-11-10 NOTE — Progress Notes (Signed)
   [facility] Phone: [phone]     EMMALIE HAIGH - 85 y.o. female MRN [id number]  Date of birth: 1929/12/30  Subjective:   cc: SOB, itching ears, numb feet  HPI:  SOB: pt still requires oxygen at home when ambulating. Still has a supply of oxygen.  Has not had any worsening of her oxygen requirement.     Ears itching: pt states both her pinnae have been itching for the past week.  No hearing changes/loss.  No pain.    Foot numbness: pt's right foot feels 'cold and numb with some tingling.  This has been occurring for a 'year or two'.  Hasn't worsened lately.  Foot feels 'heavy'.    Health maintenance: Went last week to ophthalmologist - [address]  Father and two sons.  Given drops for dry eyes.    ROS: See HPI for pertinent positives and negatives  Family history reviewed for today's visit. No changes.  Social history- patient is a non smoker   Objective:   BP 124/75   Pulse 74   Wt 145 lb 9.6 oz (66 kg)   SpO2 95%   BMI 28.44 kg/m  Gen: alert, oriented. Elderly woman. No oxygen tank.  Using a walker.  HEENT:  Normal tympanic membranes bilaterally.   CV: rgular rhythm. Normal rate. No murmur. No edema.  Resp: LCTAB. No wheeze or crackles.  Reduced air movement.   MSK: pt ambulates with assistance of walker.  No foot drop noted.   Extremities:DP and PT  pulses intact bilaterally. Feet cool to touch bilaterally.   Neuro: decreased sensation in the right foot.    Assessment/Plan:   Hypoxemia requiring supplemental oxygen Uncertain of the cause of her hypoxemia.  Required it after a hospitalization.  No diagnosis of lung disease.  Echocardiogram in 2019 showed normal systolic function with EF of 73-22% and mild diastolic dysfunction.  May be a contributing factor to her hypoxemia, but unlikely to be the primary reason.  Non-smoker. Could have a restrictive lung disease, but at her age further workup of this is not likely to be beneficial.  Hypoxemia is only present with ambulation.  95% on RA at rest, dropped to 88% when ambulating.   - filled out paperwork for renewal of supplemental oxygen.   Right sided weakness Right foot 'heaviness, numbness, tingling' consistent with previous residual deficit from CVA.  No worsening.  Ambulates with minimal difficulty when using walker.     Clemetine Marker, MD PGY-2 [facility]

## 2019-11-12 ENCOUNTER — Other Ambulatory Visit (INDEPENDENT_AMBULATORY_CARE_PROVIDER_SITE_OTHER): Payer: Self-pay | Admitting: Specialist

## 2019-11-16 ENCOUNTER — Other Ambulatory Visit: Payer: Self-pay

## 2019-11-16 DIAGNOSIS — R531 Weakness: Secondary | ICD-10-CM

## 2019-11-16 DIAGNOSIS — E785 Hyperlipidemia, unspecified: Secondary | ICD-10-CM

## 2019-11-17 MED ORDER — CLOPIDOGREL BISULFATE 75 MG PO TABS: 75.0000 mg | ORAL_TABLET | Freq: Every day | ORAL | 1 refills | Status: DC

## 2019-11-17 NOTE — Assessment & Plan Note (Signed)
>>  ASSESSMENT AND PLAN FOR RIGHT SIDED WEAKNESS WRITTEN ON 11/17/2019  7:41 PM BY Benay Pike, MD  Right foot 'heaviness, numbness, tingling' consistent with previous residual deficit from CVA.  No worsening.  Ambulates with minimal difficulty when using walker.

## 2019-11-17 NOTE — Assessment & Plan Note (Signed)
Uncertain of the cause of her hypoxemia.  Required it after a hospitalization.  No diagnosis of lung disease.  Echocardiogram in 2019 showed normal systolic function with EF of 76-19% and mild diastolic dysfunction.  May be a contributing factor to her hypoxemia, but unlikely to be the primary reason.  Non-smoker. Could have a restrictive lung disease, but at her age further workup of this is not likely to be beneficial. Hypoxemia is only present with ambulation.  95% on RA at rest, dropped to 88% when ambulating.   - filled out paperwork for renewal of supplemental oxygen.

## 2019-11-17 NOTE — Assessment & Plan Note (Signed)
Right foot 'heaviness, numbness, tingling' consistent with previous residual deficit from CVA.  No worsening.  Ambulates with minimal difficulty when using walker.

## 2019-11-21 ENCOUNTER — Other Ambulatory Visit: Payer: Self-pay | Admitting: Family Medicine

## 2019-11-22 ENCOUNTER — Other Ambulatory Visit: Payer: Self-pay

## 2019-11-22 ENCOUNTER — Telehealth: Payer: Self-pay | Admitting: Family Medicine

## 2019-11-22 ENCOUNTER — Other Ambulatory Visit: Payer: Self-pay | Admitting: Family Medicine

## 2019-11-22 MED ORDER — NYSTATIN 100000 UNIT/GM EX POWD: Freq: Four times a day (QID) | CUTANEOUS | 0 refills | Status: DC

## 2019-11-22 MED ORDER — DICLOFENAC SODIUM 1 % EX GEL: 2.0000 g | Freq: Four times a day (QID) | CUTANEOUS | 1 refills | Status: DC

## 2019-11-22 NOTE — Telephone Encounter (Signed)
Patients son came into office for a refill on her nystatin powder,and her dicofenac sodium. Please give pt's son a call at [phone].

## 2019-11-22 NOTE — Telephone Encounter (Signed)
Son came in to ask for refill of Nystatin powder and diclofenac sodium. I could not send a refill for the diclofenac sodium. No longer able to be ordered. Ottis Stain, CMA

## 2019-11-22 NOTE — Telephone Encounter (Signed)
Medication refill request sent to Dr. Jeannine Kitten. Ottis Stain, CMA

## 2020-01-25 ENCOUNTER — Telehealth: Payer: Self-pay

## 2020-01-25 NOTE — Telephone Encounter (Signed)
Patient left message on nurse line regarding rx refill for Miralax. Medication is not on patient's current medication list.   To PCP  Please advise  Veronda Prude, RN

## 2020-01-27 NOTE — Telephone Encounter (Signed)
Can you please let the patient know that miralax is just an over the counter stool softener/laxative and she only needs to take this if she is having constipation.

## 2020-01-27 NOTE — Telephone Encounter (Signed)
Informed patient that miralax is OTC and can be picked up at pharmacy of their choice. Patient was also calling regarding PA for lidocaine patches.   Spoke with pharmacist, they are sending fax to start PA process.   Veronda Prude, RN

## 2020-01-28 NOTE — Telephone Encounter (Signed)
PA can not be submitted online.  Paper form is in PCP's box.  Jone Baseman, CMA

## 2020-01-31 NOTE — Telephone Encounter (Signed)
Med Approved 11/26/19 - 01/30/21.  Pharmacy Approved and form placed in batch scanning.  Jone Baseman, CMA

## 2020-01-31 NOTE — Telephone Encounter (Signed)
Form is filled out

## 2020-02-08 ENCOUNTER — Other Ambulatory Visit (INDEPENDENT_AMBULATORY_CARE_PROVIDER_SITE_OTHER): Payer: Self-pay | Admitting: Specialist

## 2020-03-03 ENCOUNTER — Telehealth: Payer: Self-pay | Admitting: *Deleted

## 2020-03-03 ENCOUNTER — Other Ambulatory Visit: Payer: Self-pay | Admitting: Family Medicine

## 2020-03-03 DIAGNOSIS — I639 Cerebral infarction, unspecified: Secondary | ICD-10-CM

## 2020-03-03 DIAGNOSIS — R531 Weakness: Secondary | ICD-10-CM

## 2020-03-03 MED ORDER — DICLOFENAC SODIUM 1 % EX GEL: 2.0000 g | Freq: Four times a day (QID) | CUTANEOUS | 1 refills | Status: DC

## 2020-03-03 NOTE — Telephone Encounter (Signed)
Orders placed.

## 2020-03-03 NOTE — Telephone Encounter (Signed)
Son calls for 2 reasons:  1. because they do not want the rolling walker, he will be taking it back to [facility]. He would like a new order for the 4 prong walker placed.  Once it is placed you can send a message to RN team so the order can be processed.  2. He would like another script for the voltaren gel called in.  To PCP. Jone Baseman, CMA

## 2020-03-03 NOTE — Telephone Encounter (Signed)
Community message sent to [employer] and [facility] @ [facility] to process DME order for regular walker.   Jone Baseman, CMA

## 2020-03-06 NOTE — Telephone Encounter (Signed)
Received and being processed.  Jone Baseman, CMA

## 2020-03-15 ENCOUNTER — Telehealth: Payer: Self-pay | Admitting: *Deleted

## 2020-03-15 NOTE — Telephone Encounter (Signed)
Received fax from pharmacy, PA needed on diclofenac gel 1%.  Clinical questions submitted via Cover My Meds.  Waiting on response, could take up to 72 hours.  Cover My Meds info: Key: [id number]  Jone Baseman, CMA

## 2020-03-16 NOTE — Telephone Encounter (Signed)
   LMOVM of pharmacy informing of approval . Eria Lozoya, CMA  

## 2020-03-28 ENCOUNTER — Telehealth: Payer: Self-pay | Admitting: Neurology

## 2020-03-28 DIAGNOSIS — G40109 Localization-related (focal) (partial) symptomatic epilepsy and epileptic syndromes with simple partial seizures, not intractable, without status epilepticus: Secondary | ICD-10-CM

## 2020-03-28 MED ORDER — LEVETIRACETAM 500 MG PO TABS: 500.0000 mg | ORAL_TABLET | Freq: Two times a day (BID) | ORAL | 0 refills | Status: DC

## 2020-03-28 NOTE — Telephone Encounter (Signed)
[name] [name],[name] is asking for a refill on pt's IRAcetam (KEPPRA) 500 MG tablet to [phone]

## 2020-03-28 NOTE — Addendum Note (Signed)
Addended by: Ann Maki on: 03/28/2020 04:43 PM   Modules accepted: Orders

## 2020-03-28 NOTE — Telephone Encounter (Signed)
Chart reviewed and refill for Keppra 500 mg bid has been sent to [facility]. Pt has up coming appt with Amy Lomax in June.

## 2020-03-29 ENCOUNTER — Other Ambulatory Visit: Payer: Self-pay | Admitting: Neurology

## 2020-03-29 DIAGNOSIS — G40109 Localization-related (focal) (partial) symptomatic epilepsy and epileptic syndromes with simple partial seizures, not intractable, without status epilepticus: Secondary | ICD-10-CM

## 2020-03-29 MED ORDER — LEVETIRACETAM 500 MG PO TABS: 500.0000 mg | ORAL_TABLET | Freq: Two times a day (BID) | ORAL | 0 refills | Status: DC

## 2020-03-29 NOTE — Addendum Note (Signed)
Addended by: Ann Maki on: 03/29/2020 05:02 PM   Modules accepted: Orders

## 2020-03-29 NOTE — Telephone Encounter (Signed)
Refill with confirmation was sent on 03/28/2020. Refill send again today electronic confirmation received.

## 2020-03-29 NOTE — Telephone Encounter (Signed)
Zargham,[name] has called again stating pt is still waiting on this medication IRAcetam (KEPPRA) 500 MG tablet for pt is in need of this being filled by this evening

## 2020-04-26 ENCOUNTER — Other Ambulatory Visit: Payer: Self-pay

## 2020-04-26 ENCOUNTER — Ambulatory Visit (INDEPENDENT_AMBULATORY_CARE_PROVIDER_SITE_OTHER): Payer: Medicare Other | Admitting: Family Medicine

## 2020-04-26 ENCOUNTER — Encounter: Payer: Self-pay | Admitting: Family Medicine

## 2020-04-26 ENCOUNTER — Ambulatory Visit (HOSPITAL_COMMUNITY)
Admission: RE | Admit: 2020-04-26 | Discharge: 2020-04-26 | Disposition: A | Discharge status: Home / Self Care | Payer: Medicare Other | Source: Ambulatory Visit | Attending: Family Medicine | Admitting: Family Medicine

## 2020-04-26 VITALS — BP 132/64 | HR 78 | Ht 60.0 in | Wt 149.4 lb

## 2020-04-26 DIAGNOSIS — R251 Tremor, unspecified: Secondary | ICD-10-CM

## 2020-04-26 DIAGNOSIS — I639 Cerebral infarction, unspecified: Secondary | ICD-10-CM

## 2020-04-26 DIAGNOSIS — G250 Essential tremor: Secondary | ICD-10-CM

## 2020-04-26 NOTE — Progress Notes (Signed)
    SUBJECTIVE:   CHIEF COMPLAINT / HPI:   Shaking: Patient describes a "shaking feeling in her chest" for approximately 1 week.  She does not describe it as chest pain.  She has not had shortness of breath that is any worse than her baseline shortness of breath.  She also describes the shaking feeling as "all over her whole body including her hands and legs.  She has not had any worsening difficulty walking during this time.  PERTINENT  PMH / PSH: Essential tremor hypoxemia, CHF,  OBJECTIVE:   BP 132/64   Pulse 78   Ht 5' (1.524 m)   Wt 149 lb 6.4 oz (67.8 kg)   SpO2 96%   BMI 29.18 kg/m   General: Alert, oriented.  Frail appearing elderly woman accompanied by her son.  Sitting with four-legged walker in front of her.   CV: Regular rate and rhythm.  No murmurs. Pulmonary: Lungs clear to auscultation bilaterally.  No wheezes or crackles. Neuro: Medium amplitude tremoring apparent in the hands and neck at rest and with movement.  Patient able to rise from sitting position and walk assisted with walker.  Left leg appears to trail behind when walking.  ASSESSMENT/PLAN:   Essential tremor Given the patient's complaint of chest discomfort, I obtained an EKG.  This was normal.  The shaking sensation in her chest and elsewhere she described is likely a worsening of her essential tremor.  Patient has tremoring in the neck, and arms most noticeably.  She is followed by neurology for this, and I advised the patient and her son to call the neurologist to see if they could schedule a sooner appointment than the one that is currently scheduled for August given that her symptoms appear to be worsening.  I also obtained a TSH, CBC and BMP.  I will also reach out to her neurologist through epic for further advice.    Sandre Kitty, MD [facility]

## 2020-04-26 NOTE — Patient Instructions (Signed)
I would like you to contact your neurologist and see if you can get an appointment sooner than the one that is scheduled in August.  I think this is a worsening of your pre-existing tremors and the treatment for that would be changing the doses of tremor medication you are on, but they are the ones I prescribed that so I will let them make those changes.  In the meantime I will get some basic lab work to rule out other causes.  I will also send a message to your neurologist to see if they have any advice.  If you start to develop chest pain or trouble breathing, go to the emergency department.  Have a great day,  Frederic Jericho, MD

## 2020-04-27 ENCOUNTER — Ambulatory Visit: Payer: Medicare Other | Admitting: Adult Health

## 2020-04-27 ENCOUNTER — Ambulatory Visit (HOSPITAL_COMMUNITY)
Admission: RE | Admit: 2020-04-27 | Discharge: 2020-04-27 | Disposition: A | Discharge status: Home / Self Care | Payer: Medicare Other | Source: Ambulatory Visit | Attending: Internal Medicine | Admitting: Internal Medicine

## 2020-04-27 ENCOUNTER — Telehealth: Payer: Self-pay | Admitting: Neurology

## 2020-04-27 LAB — CBC WITH DIFFERENTIAL/PLATELET
Basophils Absolute: 0 10*3/uL (ref 0.0–0.2)
Basos: 1 %
EOS (ABSOLUTE): 0 10*3/uL (ref 0.0–0.4)
Eos: 1 %
Hematocrit: 46.5 % (ref 34.0–46.6)
Hemoglobin: 15.2 g/dL (ref 11.1–15.9)
Immature Grans (Abs): 0 10*3/uL (ref 0.0–0.1)
Immature Granulocytes: 0 %
Lymphocytes Absolute: 1.6 10*3/uL (ref 0.7–3.1)
Lymphs: 42 %
MCH: 30.7 pg (ref 26.6–33.0)
MCHC: 32.7 g/dL (ref 31.5–35.7)
MCV: 94 fL (ref 79–97)
Monocytes Absolute: 0.4 10*3/uL (ref 0.1–0.9)
Monocytes: 9 %
Neutrophils Absolute: 1.8 10*3/uL (ref 1.4–7.0)
Neutrophils: 47 %
Platelets: 102 10*3/uL — ABNORMAL LOW (ref 150–450)
RBC: 4.95 x10E6/uL (ref 3.77–5.28)
RDW: 12.8 % (ref 11.7–15.4)
WBC: 3.8 10*3/uL (ref 3.4–10.8)

## 2020-04-27 LAB — BASIC METABOLIC PANEL
BUN/Creatinine Ratio: 22 (ref 12–28)
BUN: 19 mg/dL (ref 8–27)
CO2: 26 mmol/L (ref 20–29)
Calcium: 9.2 mg/dL (ref 8.7–10.3)
Chloride: 104 mmol/L (ref 96–106)
Creatinine, Ser: 0.87 mg/dL (ref 0.57–1.00)
GFR calc Af Amer: 69 mL/min/{1.73_m2} (ref >59)
GFR calc non Af Amer: 60 mL/min/{1.73_m2} (ref >59)
Glucose: 89 mg/dL (ref 65–99)
Potassium: 4.6 mmol/L (ref 3.5–5.2)
Sodium: 141 mmol/L (ref 134–144)

## 2020-04-27 LAB — TSH: TSH: 1.39 u[IU]/mL (ref 0.450–4.500)

## 2020-04-27 NOTE — Telephone Encounter (Addendum)
I called the pt back and lvm for her son advising of recommendations. Advised son to call back if he had questions.

## 2020-04-27 NOTE — Telephone Encounter (Signed)
I would not recommend an increase in her mysoline. She is on a high dose. Please advise her son.

## 2020-04-27 NOTE — Telephone Encounter (Signed)
Pt son called to advise when they went to PCP office they checked pts heart and advised pts shaking is getting worst and would like to know if her primidone (MYSOLINE) 250 MG tablet  Can be increased to help pt.

## 2020-04-30 NOTE — Assessment & Plan Note (Signed)
Given the patient's complaint of chest discomfort, I obtained an EKG.  This was normal.  The shaking sensation in her chest and elsewhere she described is likely a worsening of her essential tremor.  Patient has tremoring in the neck, and arms most noticeably.  She is followed by neurology for this, and I advised the patient and her son to call the neurologist to see if they could schedule a sooner appointment than the one that is currently scheduled for August given that her symptoms appear to be worsening.  I also obtained a TSH, CBC and BMP.  I will also reach out to her neurologist through epic for further advice.

## 2020-05-08 ENCOUNTER — Other Ambulatory Visit: Payer: Self-pay

## 2020-05-08 DIAGNOSIS — R531 Weakness: Secondary | ICD-10-CM

## 2020-05-08 DIAGNOSIS — E785 Hyperlipidemia, unspecified: Secondary | ICD-10-CM

## 2020-05-09 MED ORDER — CLOPIDOGREL BISULFATE 75 MG PO TABS: 75.0000 mg | ORAL_TABLET | Freq: Every day | ORAL | 1 refills | Status: DC

## 2020-05-30 ENCOUNTER — Ambulatory Visit (INDEPENDENT_AMBULATORY_CARE_PROVIDER_SITE_OTHER): Payer: Medicare Other | Admitting: Neurology

## 2020-05-30 ENCOUNTER — Encounter: Payer: Self-pay | Admitting: Neurology

## 2020-05-30 VITALS — BP 122/74 | Ht 60.0 in | Wt 148.3 lb

## 2020-05-30 DIAGNOSIS — G250 Essential tremor: Secondary | ICD-10-CM | POA: Diagnosis not present

## 2020-05-30 DIAGNOSIS — G40109 Localization-related (focal) (partial) symptomatic epilepsy and epileptic syndromes with simple partial seizures, not intractable, without status epilepticus: Secondary | ICD-10-CM | POA: Diagnosis not present

## 2020-05-30 DIAGNOSIS — I639 Cerebral infarction, unspecified: Secondary | ICD-10-CM | POA: Diagnosis not present

## 2020-05-30 DIAGNOSIS — Z8673 Personal history of transient ischemic attack (TIA), and cerebral infarction without residual deficits: Secondary | ICD-10-CM

## 2020-05-30 MED ORDER — LEVETIRACETAM 500 MG PO TABS: 500.0000 mg | ORAL_TABLET | Freq: Two times a day (BID) | ORAL | 3 refills | Status: DC

## 2020-05-30 NOTE — Patient Instructions (Signed)
As discussed, your tremor has indeed become worse over time.  Given that you are already on the higher dose of Mysoline I do not feel comfortable increasing it any further for fear of side effects.  Please continue to hydrate well with water, use your walker at all times, stay well rested and try to limit your caffeine, currently not overdoing any caffeine, 1 cup of tea per day is fine.  Please continue with your medication, your prescriptions are up-to-date.  Please follow-up routinely to see Butch Penny, NP, in 6 months.

## 2020-05-30 NOTE — Progress Notes (Signed)
Subjective:    Patient ID: [name] [name] is a 86 y.o. female.  HPI     Interim history:   [name] [name] is a very pleasant 86 year old [name] lady with an underlying medical history of hypertension, hyperlipidemia, DVT, pneumonia, anxiety, obesity, diabetes, peripheral neuropathy, stroke and essential tremor, who presents for follow-up consultation of her essential tremor, and prior history of seizure and stroke. The patient is accompanied by her son again today. I last saw her on 10/27/2019, at which time she was fairly stable.  Sometimes she felt that her right side was a little weaker, she had residual weakness from the stroke in the past.  She was advised to look into using weighted utensils to be able to feed herself a little better.  Her son called in the interim with concern of worsening tremor.  I also received a staff message from her primary care physician regarding this.  I did not suggest increasing her Mysoline as she is already on a high dose.  Today, 05/30/2020: She reports that her tremor feels worse, sometimes she feels an inner tremor in addition to her hand and head tremor.  It is hard for her to feed herself.  Thankfully, she has had no recent new symptoms such as one-sided weakness or numbness or tingling or seizures or falls.  Her heart doctor has told her that she can skip or reduce her Lasix as long as she avoids excess salt.  She has not had much in the way of swelling lately.  She tries to hydrate well.  She drinks 1 cup of tea in the mornings and otherwise no daily caffeine, occasional soda.  Previously:      I saw her on 09/07/2018, at which time she was stable.  She had issues with back pain, she had been found to have osteopenia.  She had no recent falls.  She has had right-sided residual weakness from her stroke.  Her tremor has been somewhat progressive.  She had no recent seizures.    She saw Debbora Presto, nurse practitioner in the interim with a phone call visit on  04/26/2019, at which time she was stable.    I saw her on 07/08/2017 for a sooner than scheduled appointment because she felt that the tremor was worse, she also had intermittent leg cramps. She had issues with low back pain. She had a recent lumbar spine MRI. She was supposed to see Dr. Ernestina Patches for injection into the spine. She was advised to continue with low-dose gabapentin, Mysoline twice daily, and Keppra twice daily.     I saw her on 11/11/2016, at which time her tremor was mostly stable, she had no recent falls. Her right leg weakness was about the same as well. She did present to the emergency room in November 2017 for back pain and leg pain. She was found to have a UTI.  She was also found on CT lumbar spine to have a L1 vertebral body fracture, old.    She was seen in the interim by Ward Givens on 05/13/2017, at which time she was noted to be fairly stable and her medications were kept the same.   She presented in the interim to the emergency room on 05/31/2017 for hip pain on the left. She saw her family physician on 07/02/2017.   I saw her on 05/08/2016, at which time she had recently fallen about 10 days prior. She had visited her grandmother daughter who is expecting a baby and fell  backwards landed on her back. She had left-sided back pain since then. She went to the emergency room on 05/04/2016 and was given an injection with Decadron, Toradol, Dilaudid, but did not have much in way of relief. She had a bone scan on 05/06/16, which I reviewed: IMPRESSION: 1. No evidence of a recent fracture. 2. No evidence of neoplastic disease to bone. 3. Significant degenerative uptake noted along spine as well as involving multiple bilateral joints. She reported that her tremor was worse. She had more stress and also more pain.   I suggested she continue with her Keppra, Plavix, Mysoline and Neurontin.   I saw her on 09/25/15, at which time she felt she was doing well. She was worried about her  brother in [place]. She was not able to travel to [place] and was sad about it. She was planning to fly to [place] to be with her daughter and other family for some time. No recent seizures or strokelike symptoms were reported. Tremor was stable. She was using a cane and had no recent falls. I suggested we continue with Keppra, Plavix, gabapentin and Mysoline at the same doses.   I saw her on 04/26/2015 at which time she reported difficulty with her balance. Her tremor had become a little worse. She had had no recent seizures or auras. She was able to tolerate Keppra 500 mg twice daily. She was on the same dose of Mysoline for her tremors. She had benefited from physical therapy at [facility] and requested to go back for outpatient physical therapy. She had not fallen. She was using a 4 pronged cane and was no longer using her walker.   I saw her on 11/16/2014, at which time her son reported that she was doing well. She was in outpatient physical therapy and was using a 2 wheeled walker. She was taking gabapentin at a lower dose. Lowering gabapentin had improved her heavy headedness in the morning. Her tremor was stable. She was tolerating Keppra 500 mg twice daily with no recent seizures or auras reported. Overall she has done well. I continued her on the same dose of primidone, the same dose of Keppra, and suggested we reduce her gabapentin further to 100 mg in the morning and 200 mg in the evening.    Of note, they canceled an appointment for 03/17/2015.   I saw her on 08/19/2014 at which time she presented after her hospitalization for stroke. Her son reported that Aggrenox was causing her headaches and she was switched to Plavix. We talked about sleep apnea screening. She had no witnessed apneas and only mild snoring. She had done fairly well. They requested a referral to outpatient therapy in [facility]. She was placed on Keppra for episodic twitching. She had had some medication changes when she  was hospitalized for her stroke. She was advised to use her 2 wheeled walker with assistance. I referred her to physical therapy outpatient. We talked about secondary stroke prevention. I suggested she reduce gabapentin to 200 mg twice daily down from 300 mg twice daily because of dizziness and head heaviness reported. This continued on Mysoline for her essential tremor.   I saw her on 10/27/2013 at which time she presented for followup of her long-standing history of essential tremor. I felt she was stable in that regard and felt that her diabetes had resulted in diabetic neuropathy. I asked her to continue with Mysoline at the same strength and increased her gabapentin to 3 pills at  night for a total of 300 mg each bedtime.   In the interim she presented to the emergency room on 07/18/2014 with new onset right-sided weakness, facial droop, slurring of speech which started around 6 PM the night before. She was in [place], [place] visiting family when she had acute onset of the symptoms. She was seen in [place] at the hospital and CT head was negative for bleed but she did not receive TPA because of low platelet count of 90,000. She had improvement of her symptoms but persistent deficits. She was admitted to the hospital and had a complete stroke workup. She had an MRI of the brain as well as MRA head and was found to have a left thalamic ischemic stroke. Neurology was consulted. MRA showed left PCA occlusion. Echocardiogram showed an EF of 60-65% with no wall motion abnormalities and no source of emboli. Carotid Doppler studies were negative for any significant ICA stenosis (1-39%). Hb A1c was 6. LDL was 62. She was placed on aspirin for secondary stroke prevention and then changed to Aggrenox. Blood work showed normal B12 levels, negative hepatitis serology, chest x-ray was negative. EKG did not show any ST abnormalities. She had some left leg pain. Lower extremity Doppler studies from 07/20/2014  were negative for DVT. She was discharged on 07/21/2014 to inpatient rehabilitation. She was discharged from rehabilitation on 08/02/2014. I reviewed hospital records as well as imaging tests. Her daughter called and asked for a sooner than scheduled appointment because of worsening weakness. However, she missed an appointment on 08/05/2014 because she was readmitted to the hospital on 08/04/2014 for slurring of speech and shaking spells. I reviewed the hospital records. She had an EEG, which was negative for seizure activity but she was felt to have partial onset seizures and was started on Keppra which is currently at 500 mg twice daily. She was changed from Aggrenox to Plavix 75 mg.   She had a brain MRI without contrast on 08/05/2014: 1. Persistent 1.1 cm focus of linear restricted diffusion within the lateral left thalamus near the posterior limb of the left internal capsule. Finding is favored to reflect resolving infarct from the 07/19/2014 study given the relative decreased signal intensity and size from that prior exam, although possible acute re-infarction of this area is not entirely excluded. 2. No other acute intracranial abnormality. In addition, I personally reviewed the images through the PACS system. She had an EEG on 08/06/2014: This was reported as normal in the asleep and awake states. Her son called on 08/15/2014 to make an appointment.   I first met her on 01/19/2013, at which time I increased her Mysoline 250 mg strength 1-1/2 pills to 2 pills daily. I did advise her of the potential increase risk of side effects. She previously followed with Dr. Morene Antu. She has a billateral UE and head tremor for over 25 years. She was tried on primidone while she was in [place] with improvement in tremor. It was discontinued when she came to the [place] because she was on Coumadin. Her tremor affects her ability to feed herself, write, and put on her makeup and is worse with anxiety. Her  daughter has a tremor as well. She was restarted on primidone by Dr. Erling Cruz in 3/12. She is no longer on coumadin. She was on coumadin for a history of DVT in her left leg following left foot injury. She has had numbness in her legs without pain. EMG/NCV 02/27/11 was normal except for some denervation  in the left lumbosacral paraspinal muscles. B12 level was normal. She is on gabapentin 100 mg 2 at night with relief of symptoms. Her major complaint is that of ongoing tremor, which is worse in the morning. She has not had side effects such as grogginess or difficulty walking with the primidone. She no longer cooks. Her tremor involves her writing and abilities to feed herself. She also continues to take gabapentin 166m 2 qHS for her neuropathy. She has not fallen per son. She was supposed to come back in 3 months but missed several appointments. She has been diagnosed with DM and has been started on metforim. She feels, her numbness and tingling is worse in her feet and she has now has started noticing burning sensation in her feet.  Her Past Medical History Is Significant For: Past Medical History:  Diagnosis Date  . Anxiety   . Diabetes ([place] 10/27/2013  . DVT (deep venous thrombosis) ([facility] [place]   . Essential tremor 10/27/2013  . High cholesterol 1999  . Hypertension   . Ischemic stroke ([facility] 07/18/2014  . Obesity, unspecified 10/27/2013  . Pneumonia   . Unspecified hereditary and idiopathic peripheral neuropathy 10/27/2013    Her Past Surgical History Is Significant For: Past Surgical History:  Procedure Laterality Date  . CATARACT EXTRACTION Bilateral   . FOOT SURGERY    . IR KYPHO LUMBAR INC FX REDUCE BONE BX UNI/BIL CANNULATION INC/IMAGING  06/15/2019    Her Family History Is Significant For: Family History  Problem Relation Age of Onset  . Stroke Mother   . Heart Problems Brother   . Coronary artery disease Other   . Heart Problems Sister     Her Social History Is Significant For: Social  History   Socioeconomic History  . Marital status: Widowed    Spouse name: Not on file  . Number of children: 5  . Years of education: 9  . Highest education level: Not on file  Occupational History    Comment: does not work  Tobacco Use  . Smoking status: Never Smoker  . Smokeless tobacco: Never Used  Vaping Use  . Vaping Use: Never used  Substance and Sexual Activity  . Alcohol use: No    Alcohol/week: 0.0 standard drinks  . Drug use: No  . Sexual activity: Never  Other Topics Concern  . Not on file  Social History Narrative   Patient is right handed and resides with son   Social Determinants of Health   Financial Resource Strain:   . Difficulty of Paying Living Expenses:   Food Insecurity:   . Worried About RCharity fundraiserin the Last Year:   . RArboriculturistin the Last Year:   Transportation Needs:   . LFilm/video editor(Medical):   .Marland KitchenLack of Transportation (Non-Medical):   Physical Activity:   . Days of Exercise per Week:   . Minutes of Exercise per Session:   Stress:   . Feeling of Stress :   Social Connections:   . Frequency of Communication with Friends and Family:   . Frequency of Social Gatherings with Friends and Family:   . Attends Religious Services:   . Active Member of Clubs or Organizations:   . Attends CArchivistMeetings:   .Marland KitchenMarital Status:     Her Allergies Are:  Allergies  Allergen Reactions  . Tape Rash    Please do not use Plastic Tape  :   Her Current Medications Are:  Outpatient Encounter Medications as of 05/30/2020  Medication Sig  . ACCU-CHEK AVIVA PLUS test strip USE AS INSTRUCTED TO TEST ONCE A DAY DX CODE:E11.9  . acetaminophen (TYLENOL) 325 MG tablet Take 2 tablets (650 mg total) by mouth every 6 (six) hours as needed.  . ALPRAZolam (XANAX) 0.25 MG tablet Take 0.25 mg by mouth 2 (two) times daily as needed for anxiety.   [name] amLODipine (NORVASC) 2.5 MG tablet Take 2.5 mg by mouth daily.  [name] atorvastatin  (LIPITOR) 20 MG tablet Take 1 tablet (20 mg total) by mouth every evening.  [name] CALCIUM PO Take 1 tablet by mouth daily.  . Cholecalciferol (VITAMIN D PO) Take 1 tablet by mouth daily.  . clopidogrel (PLAVIX) 75 MG tablet Take 1 tablet (75 mg total) by mouth daily.  . diclofenac Sodium (VOLTAREN) 1 % GEL Apply 2 g topically 4 (four) times daily.  . DULoxetine (CYMBALTA) 20 MG capsule TAKE 1 CAPSULE BY MOUTH EVERY DAY  . furosemide (LASIX) 20 MG tablet Take 20 mg by mouth daily.  [name] gabapentin (NEURONTIN) 100 MG capsule Take 1 capsule (100 mg total) by mouth at bedtime.  . levETIRAcetam (KEPPRA) 500 MG tablet Take 1 tablet (500 mg total) by mouth 2 (two) times daily.  [name] lidocaine (LIDODERM) 5 % Place 1 patch onto the skin daily. Remove & Discard patch within 12 hours or as directed by MD  . nystatin (MYCOSTATIN/NYSTOP) powder Apply topically 4 (four) times daily.  . Omega-3 Fatty Acids (FISH OIL) 1000 MG CAPS Take 1 capsule by mouth every other day.   [name] PAZEO 0.7 % SOLN Place 1 drop into both eyes at bedtime.   . potassium chloride SA (K-DUR,KLOR-CON) 20 MEQ tablet Take 20 mEq by mouth daily.   . primidone (MYSOLINE) 250 MG tablet Take 1 tablet (250 mg total) by mouth 2 (two) times daily.  . RESTASIS 0.05 % ophthalmic emulsion Place 1 drop into both eyes 2 (two) times daily.   . traMADol-acetaminophen (ULTRACET) 37.5-325 MG tablet TAKE 2 TABLETS BY MOUTH EVERY 4 HOURS AS NEEDED FOR UP TO 7 DAYS.   No facility-administered encounter medications on file as of 05/30/2020.  :  Review of Systems:  Out of a complete 14 point review of systems, all are reviewed and negative with the exception of these symptoms as listed below: Review of Systems  Neurological:       Here for f/u on tremors. Husband reports tremors have worsened some since last visit. Sts pt will complain of tremors on the inside and outside now.    Objective:  Neurological Exam  Physical Exam Physical Examination:   Vitals:    05/30/20 0935  BP: 122/74   General Examination: The patient is a very pleasant 86 y.o. female in no acute distress. She appears well-developed and well-nourished and well groomed.   HEENT:Normocephalic, atraumatic, pupils are equal, round and reactive to light, hearing isgrosslyintact. Extraocular tracking is good without nystagmus. She has a moderate degree of headtremor, no-no type,appears stable. She has a mild to moderate degree of voice tremor. Sheisnot dysarthric. Oropharynx is clear. She has mild airway crowding, dentures in place. Neckis supple, shows FROM.  Chest:Clear to auscultation without wheezing, rhonchi or crackles noted.  Heart:S1+S2+0, regular and normal without murmurs, rubs or gallops noted.   Abdomen:Soft, non-tender and non-distended with normal bowel sounds appreciated on auscultation.  Extremities:There istrace pitting edema, right more than left around the ankles.   Skin: Warm and dry without trophic changes noted.  There are no varicose veins.  Musculoskeletal: exam reveals no obvious joint deformities, tenderness or joint swelling or erythema,no obvious pain.  Neurologically:  Mental status: The patient is awake, alert and oriented in all 4 spheres. His memory, attention, language and knowledge are appropriate, her son assists with translation. Mood is congruent and affect is normal.  Cranial nerves are as described above under HEENT exam.  Motor exam:thin bulk, global strength of 4 out of 5,perhaps a little weaker in R hip flexor and Foot dorsiflexion on the right, stable. Fine motor skills are globally mildly impaired in the upper and lower extremities.Rombergisnot tested for safetyreasons. She has a bilateral upper extremity tremors including a mild resting tremor in the right upper extremity, postural tremor both upper extremities and action tremor in the moderate degree in both upper extremities, L a little worse than R. Cerebellar  testing shows no dysmetria or intention tremor.  Sensory exam: intact to light touch.  Gait, station and balance: She stands up slowlyand requires a little assistance, walks slowly and cautiously, tandem walk is not possible, balance is impaired. She has a mildly stooped posture.Uses a 2 wheeled walker.   Assessment and Plan:   In summary, Zettie B Pilling is a very pleasant 86 year old female with a history of DVT, ET, stroke, partial seizures and obesity,who presents forfollow-up consultation of her essential tremor of many years duration. She has had no recent stroke-like Sx or seizure-like events. She is tolerating her medicines, but has felt a worsening of her tremors. We have reduced her gabapentin over time and maintained her Mysoline 250 mg twice daily. She has been on Keppra 500 mg twice daily. She is encouraged to continue with her Mysoline 250 mg twice daily, I would not recommend increasing this for fear of side effects.  I explained this to her and she is in agreement.  I advised the patient and her son that tremors to get worse over time.  She is agreeable to continuing with her current medications.  She is reminded to stay well-hydrated with water and well rested and limit her caffeine intake, she currently only drinks 1 cup of tea per day on average.  She is advised to follow-up in 6 months to see Ward Givens, nurse practitioner routinely, sooner if needed.  I answered all the questions today and the patient and her son were in agreement.  I spent 20 minutes in total face-to-face time and in reviewing records during pre-charting, more than 50% of which was spent in counseling and coordination of care, reviewing test results, reviewing medications and treatment regimen and/or in discussing or reviewing the diagnosis of ET, the prognosis and treatment options. Pertinent laboratory and imaging test results that were available during this visit with the patient were reviewed by me and  considered in my medical decision making (see chart for details).

## 2020-06-18 ENCOUNTER — Other Ambulatory Visit (INDEPENDENT_AMBULATORY_CARE_PROVIDER_SITE_OTHER): Payer: Self-pay | Admitting: Specialist

## 2020-06-19 NOTE — Telephone Encounter (Signed)
Please advise 

## 2020-07-04 ENCOUNTER — Ambulatory Visit: Payer: Medicare Other | Admitting: Adult Health

## 2020-08-08 ENCOUNTER — Other Ambulatory Visit: Payer: Self-pay

## 2020-08-08 MED ORDER — ATORVASTATIN CALCIUM 20 MG PO TABS: 20.0000 mg | ORAL_TABLET | Freq: Every evening | ORAL | 3 refills | Status: DC

## 2020-09-07 ENCOUNTER — Other Ambulatory Visit: Payer: Self-pay | Admitting: Family Medicine

## 2020-09-14 ENCOUNTER — Telehealth: Payer: Self-pay | Admitting: *Deleted

## 2020-09-14 NOTE — Telephone Encounter (Signed)
Fax requesting new Rx for ACCU-CHEK lancets. Did not see on current med list. Delbert Vu Lamonte Sakai, CMA

## 2020-09-18 NOTE — Telephone Encounter (Signed)
Another requests for lancets came in via fax.Zade Falkner Zimmerman Rumple, CMA

## 2020-09-20 ENCOUNTER — Telehealth: Payer: Self-pay

## 2020-09-20 NOTE — Telephone Encounter (Signed)
Fax from pharmacy asking for lancets. Did not see these on med list. Sunday Spillers, CMA

## 2020-09-23 ENCOUNTER — Other Ambulatory Visit: Payer: Self-pay | Admitting: Family Medicine

## 2020-09-23 MED ORDER — ACCU-CHEK SOFTCLIX LANCETS MISC
12 refills | Status: DC
Start: 2020-09-23 — End: 2022-10-04

## 2020-09-23 NOTE — Telephone Encounter (Signed)
I prescribed her some lancets because she may need to occasionally check herself for hyperglycemia even though she is diet controlled now.

## 2020-10-16 ENCOUNTER — Other Ambulatory Visit: Payer: Self-pay | Admitting: Neurology

## 2020-10-16 ENCOUNTER — Other Ambulatory Visit: Payer: Self-pay | Admitting: Family Medicine

## 2020-10-16 DIAGNOSIS — G250 Essential tremor: Secondary | ICD-10-CM

## 2020-10-16 DIAGNOSIS — E785 Hyperlipidemia, unspecified: Secondary | ICD-10-CM

## 2020-10-16 DIAGNOSIS — E0842 Diabetes mellitus due to underlying condition with diabetic polyneuropathy: Secondary | ICD-10-CM

## 2020-10-16 DIAGNOSIS — R531 Weakness: Secondary | ICD-10-CM

## 2020-11-29 NOTE — Progress Notes (Signed)
    SUBJECTIVE:   CHIEF COMPLAINT / HPI:   This is a very pleasant 87 year old patient presenting to clinic with a couple of concerns.  She is accompanied by her son who is translating for her.  Low sugars: The patient is occasionally experiencing low blood sugars at home.  The son is checking her blood sugar in the morning, most recent readings have been 103, 102, and most recently 86.  When the blood sugar was 86 she felt a little bit unwell with some shaking, slight sensation of nausea.  She felt better after eating/drinking.  The patient has a history of type 2 diabetes, however her most recent HbA1c was 5.4%.  She is not currently being treated for her diabetes.  Issues with using bathroom: The patient reports for the last couple of weeks she has been having more frequent urination.  She takes 10 mg of Lasix in the morning every other day which is prescribed to her by her cardiologist.  She reports that most of her urinary leaking occurs at night and she wears depends for this.  She denies any leaking with coughing or sneezing.  She reports urinary urgency, reports that she urinates 3-4 times daily, denies bladder pain, blood in the urine, and dysuria.  She reports that she only urinates small amounts.   Health maintenance: According to our records patient is due for both COVID and flu vaccines, however she brings documentation of these vaccines performed at a local pharmacy.  She is also due for diabetic ophthalmology exam, tetanus shot, diabetic foot exam.  PERTINENT  PMH / PSH:  T2DM, HLD, HTN, Hx CVA, CHF   OBJECTIVE:   BP 140/60   Pulse 74   Wt 145 lb 4 oz (65.9 kg)   SpO2 94%   BMI 28.37 kg/m    Physical exam: General: Well-appearing, pleasant patient Respiratory: Comfortable work of breathing on room air  ASSESSMENT/PLAN:   DM2 (diabetes mellitus, type 2) Patient's HbA1c today is 5.4%, she is not currently being treated for diabetes.  She has been checking her blood  sugar every morning with occasional lower readings. -Encouraged the patient to stop checking her blood sugar -Instructed her to consume juice or food should she feel as if her blood sugar is low with sweating, shaking, nausea.  Urinary frequency Patient with 2 weeks of urinary frequency and urgency without bladder pain, dysuria, also denies blood in the urine.  Dipstick performed today shows trace blood (no microscopic evaluation was performed), negative nitrite, and small 1+ leuk esterase.  Urine sample was not a clean-catch as patient had to urinate into "hat".  Could consider collecting urine sample again to reevaluate with microscopy as indicated.  I believe patient is more so experiencing overflow incontinence and that she might benefit from [facility]. -Patient to follow-up with PCP on January 13, at this point can reevaluate symptoms and consider starting Myrbetriq -The patient was sent a MyChart message regarding the results and plan to follow-up with her PCP on January 13 and further discuss options  Dollene Cleveland, DO [facility]

## 2020-11-30 ENCOUNTER — Ambulatory Visit: Payer: Medicare Other | Admitting: Family Medicine

## 2020-11-30 ENCOUNTER — Encounter: Payer: Self-pay | Admitting: Family Medicine

## 2020-11-30 ENCOUNTER — Ambulatory Visit (INDEPENDENT_AMBULATORY_CARE_PROVIDER_SITE_OTHER): Payer: Medicare Other | Admitting: Family Medicine

## 2020-11-30 ENCOUNTER — Other Ambulatory Visit: Payer: Self-pay

## 2020-11-30 VITALS — BP 140/60 | HR 74 | Wt 145.2 lb

## 2020-11-30 DIAGNOSIS — E119 Type 2 diabetes mellitus without complications: Secondary | ICD-10-CM

## 2020-11-30 DIAGNOSIS — R350 Frequency of micturition: Secondary | ICD-10-CM

## 2020-11-30 LAB — POCT GLYCOSYLATED HEMOGLOBIN (HGB A1C): Hemoglobin A1C: 5.4 % (ref 4.0–5.6)

## 2020-11-30 NOTE — Patient Instructions (Signed)
Today we are checking your A1c to look at diabetes.   We have collected a urine sample to see if you are having any sort of infection causing the urinary frequency. We will call with results and treat as needed.   Please follow up next week on Thursday, January 13th at 2:30pm with Dr. Frederic Jericho to follow up for unary frequency.   You can stop checking blood sugars.    Peggyann Shoals, DO [facility], PGY-3 11/30/2020 11:58 AM

## 2020-12-01 ENCOUNTER — Ambulatory Visit: Payer: Medicare Other | Admitting: Family Medicine

## 2020-12-01 LAB — POCT URINALYSIS DIP (MANUAL ENTRY)
Bilirubin, UA: NEGATIVE
Glucose, UA: NEGATIVE mg/dL
Nitrite, UA: NEGATIVE
Protein Ur, POC: NEGATIVE mg/dL
Spec Grav, UA: >=1.03 — AB (ref 1.010–1.025)
Urobilinogen, UA: 0.2 E.U./dL
pH, UA: 5 (ref 5.0–8.0)

## 2020-12-03 ENCOUNTER — Other Ambulatory Visit (INDEPENDENT_AMBULATORY_CARE_PROVIDER_SITE_OTHER): Payer: Self-pay | Admitting: Specialist

## 2020-12-03 ENCOUNTER — Other Ambulatory Visit: Payer: Self-pay | Admitting: Neurology

## 2020-12-04 ENCOUNTER — Ambulatory Visit: Payer: Medicare Other | Admitting: Neurology

## 2020-12-06 ENCOUNTER — Encounter: Payer: Self-pay | Admitting: Family Medicine

## 2020-12-06 DIAGNOSIS — R350 Frequency of micturition: Secondary | ICD-10-CM | POA: Insufficient documentation

## 2020-12-06 DIAGNOSIS — R32 Unspecified urinary incontinence: Secondary | ICD-10-CM | POA: Insufficient documentation

## 2020-12-06 NOTE — Assessment & Plan Note (Signed)
>>  ASSESSMENT AND PLAN FOR URINARY FREQUENCY WRITTEN ON 12/06/2020  8:22 AM BY Peggyann Shoals C, DO  Patient with 2 weeks of urinary frequency and urgency without bladder pain, dysuria, also denies blood in the urine.  Dipstick performed today shows trace blood (no microscopic evaluation was performed), negative nitrite, and small 1+ leuk esterase.  Urine sample was not a clean-catch as patient had to urinate into "hat".  Could consider collecting urine sample again to reevaluate with microscopy as indicated.  I believe patient is more so experiencing overflow incontinence and that she might benefit from [facility]. -Patient to follow-up with PCP on January 13, at this point can reevaluate symptoms and consider starting Myrbetriq -The patient was sent a MyChart message regarding the results and plan to follow-up with her PCP on January 13 and further discuss options

## 2020-12-06 NOTE — Assessment & Plan Note (Signed)
Patient's HbA1c today is 5.4%, she is not currently being treated for diabetes.  She has been checking her blood sugar every morning with occasional lower readings. -Encouraged the patient to stop checking her blood sugar -Instructed her to consume juice or food should she feel as if her blood sugar is low with sweating, shaking, nausea.

## 2020-12-06 NOTE — Assessment & Plan Note (Addendum)
Patient with 2 weeks of urinary frequency and urgency without bladder pain, dysuria, also denies blood in the urine.  Dipstick performed today shows trace blood (no microscopic evaluation was performed), negative nitrite, and small 1+ leuk esterase.  Urine sample was not a clean-catch as patient had to urinate into "hat".  Could consider collecting urine sample again to reevaluate with microscopy as indicated.  I believe patient is more so experiencing overflow incontinence and that she might benefit from [facility]. -Patient to follow-up with PCP on January 13, at this point can reevaluate symptoms and consider starting Myrbetriq -The patient was sent a MyChart message regarding the results and plan to follow-up with her PCP on January 13 and further discuss options

## 2020-12-07 ENCOUNTER — Other Ambulatory Visit: Payer: Self-pay

## 2020-12-07 ENCOUNTER — Ambulatory Visit (INDEPENDENT_AMBULATORY_CARE_PROVIDER_SITE_OTHER): Payer: Medicare Other | Admitting: Family Medicine

## 2020-12-07 ENCOUNTER — Encounter: Payer: Self-pay | Admitting: Family Medicine

## 2020-12-07 VITALS — BP 136/62 | HR 73 | Ht 60.0 in | Wt 147.0 lb

## 2020-12-07 DIAGNOSIS — R350 Frequency of micturition: Secondary | ICD-10-CM

## 2020-12-07 NOTE — Patient Instructions (Addendum)
It was nice to see you again today,  I will sign the paperwork for the incontinence supplies and leave a copy for you at the front desk.  I will send in a referral to the specialist for further evaluation of your incontinence.  Someone should call you in a week.  If you have any issues please call us.  Have a great day,  Frederic Jericho, MD

## 2020-12-07 NOTE — Progress Notes (Signed)
    SUBJECTIVE:   CHIEF COMPLAINT / HPI:   Patient still having symptoms consistent with urinary frequency and incomplete voiding discussed with Dr. Dareen Piano at her appointment last week.  She would like a prescription for incontinence supplies.   PERTINENT  PMH / PSH:   OBJECTIVE:   BP 136/62   Pulse 73   Ht 5' (1.524 m)   Wt 147 lb (66.7 kg)   SpO2 95%   BMI 28.71 kg/m   Gen: alert.  Elderly woman, frail appearing woman appears stated age. Accompanied by her son.  Cv: RRR.  Pulm: lctab.   ASSESSMENT/PLAN:   Urinary frequency Patient's symptoms of frequent urination in small amounts with feeling of incomplete emptying consistent with overflow incontinence likely from pelvic organ prolapse.  Unable to perform pelvic exam today per patient's preference. Will refer to urogynecology for further evaluation and possible pessary placement.      Sandre Kitty, MD [facility]

## 2020-12-09 ENCOUNTER — Other Ambulatory Visit: Payer: Self-pay | Admitting: Neurology

## 2020-12-09 DIAGNOSIS — G40109 Localization-related (focal) (partial) symptomatic epilepsy and epileptic syndromes with simple partial seizures, not intractable, without status epilepticus: Secondary | ICD-10-CM

## 2020-12-09 NOTE — Assessment & Plan Note (Signed)
>>  ASSESSMENT AND PLAN FOR URINARY FREQUENCY WRITTEN ON 12/09/2020  3:49 PM BY Sandre Kitty, MD  Patient's symptoms of frequent urination in small amounts with feeling of incomplete emptying consistent with overflow incontinence likely from pelvic organ prolapse.  Unable to perform pelvic exam today per patient's preference. Will refer to urogynecology for further evaluation and possible pessary placement.

## 2020-12-09 NOTE — Assessment & Plan Note (Signed)
Patient's symptoms of frequent urination in small amounts with feeling of incomplete emptying consistent with overflow incontinence likely from pelvic organ prolapse.  Unable to perform pelvic exam today per patient's preference. Will refer to urogynecology for further evaluation and possible pessary placement.

## 2020-12-13 ENCOUNTER — Telehealth: Payer: Self-pay

## 2020-12-13 NOTE — Telephone Encounter (Signed)
Community message sent to [facility] to process incontinence supplies.

## 2020-12-13 NOTE — Telephone Encounter (Signed)
Received confirmation DME order was received by [facility].

## 2021-01-03 ENCOUNTER — Telehealth: Payer: Self-pay | Admitting: Family Medicine

## 2021-01-03 NOTE — Telephone Encounter (Signed)
LMN for incontinence supplies.  

## 2021-01-14 ENCOUNTER — Other Ambulatory Visit: Payer: Self-pay | Admitting: Neurology

## 2021-01-25 ENCOUNTER — Other Ambulatory Visit: Payer: Self-pay | Admitting: Family Medicine

## 2021-01-25 DIAGNOSIS — E785 Hyperlipidemia, unspecified: Secondary | ICD-10-CM

## 2021-01-25 DIAGNOSIS — R531 Weakness: Secondary | ICD-10-CM

## 2021-01-31 ENCOUNTER — Other Ambulatory Visit: Payer: Self-pay | Admitting: Family Medicine

## 2021-02-09 ENCOUNTER — Telehealth: Payer: Self-pay

## 2021-02-09 NOTE — Telephone Encounter (Signed)
Patient's son calls nurse line regarding issues with picking up Lidoderm patches. Medication requires prior authorization.   Will forward to Page for further processing of request.   Veronda Prude, RN

## 2021-02-22 NOTE — Telephone Encounter (Signed)
    Approval received from 11/25/2020-02/22/2022.  Called pharmacy with approval. Patient's son called and updated.   Veronda Prude, RN

## 2021-02-22 NOTE — Telephone Encounter (Signed)
Received fax from pharmacy, PA needed on Lidoderm 5% patches.  Clinical questions submitted via Cover My Meds.  Waiting on response, could take up to 72 hours.  Cover My Meds info: Key: Leslie Dales, RN

## 2021-03-01 ENCOUNTER — Ambulatory Visit (INDEPENDENT_AMBULATORY_CARE_PROVIDER_SITE_OTHER): Payer: Medicare Other | Admitting: Neurology

## 2021-03-01 ENCOUNTER — Encounter: Payer: Self-pay | Admitting: Neurology

## 2021-03-01 VITALS — BP 139/78 | HR 100 | Ht 60.0 in | Wt 151.0 lb

## 2021-03-01 DIAGNOSIS — G40109 Localization-related (focal) (partial) symptomatic epilepsy and epileptic syndromes with simple partial seizures, not intractable, without status epilepticus: Secondary | ICD-10-CM | POA: Diagnosis not present

## 2021-03-01 DIAGNOSIS — Z8673 Personal history of transient ischemic attack (TIA), and cerebral infarction without residual deficits: Secondary | ICD-10-CM | POA: Diagnosis not present

## 2021-03-01 DIAGNOSIS — G250 Essential tremor: Secondary | ICD-10-CM

## 2021-03-01 DIAGNOSIS — E0842 Diabetes mellitus due to underlying condition with diabetic polyneuropathy: Secondary | ICD-10-CM | POA: Diagnosis not present

## 2021-03-01 NOTE — Patient Instructions (Signed)
It was nice to see you again today.  As discussed, your leg and foot pain could be from diabetes related neuropathy and also from circulation issues.  Unfortunately, there is not a whole lot we can do differently, you are already on Cymbalta and gabapentin, in fact, we had reduced the gabapentin in the past because of side effects.  For your tremor, we will continue with your Mysoline 250 mg twice daily.  Please continue to use your walker at all times for safety and try to hydrate well with water, eat nutritious food, follow-up routinely in 1 year.

## 2021-03-01 NOTE — Progress Notes (Signed)
Subjective:    Patient ID: [name] [name] is a 87 y.o. female.  HPI     Interim history:   [name] [name] is a very pleasant 87 year old [name] lady with an underlying medical history of hypertension, hyperlipidemia, DVT, pneumonia, anxiety, obesity, diabetes, peripheral neuropathy, stroke and essential tremor, who presents for follow-up consultation of her essential tremor, and prior history of seizure and stroke. The patient is accompanied by her son again today. I last saw her on 05/30/2020, at which time she reported that her tremor was worse, sometimes she feels an inner tremor in addition to her hand and head tremor. It was hard for her to feed herself. She denied any recent new symptoms such as one-sided weakness or numbness or tingling or seizures or falls. Her heart doctor told her that she could skip or reduce her Lasix as long as she avoided excess salt.  She had not had much in the way of swelling. I did not suggest increasing her Mysoline given that she was already on a higher dose.  She was advised to routinely follow-up in this clinic in 6 months.  Today, 03/01/2021: She reports that the tremor is a little worse, sometimes she feels weaker in the left hand.  She has not fallen thankfully, she uses her walker at all times and tries to stay active by walking up and down the hallway.  She hydrates as well as she can, appetite is good, continues to take all her medicines, has had some recent increase in foot pain, she feels very cold at night and sleeps in fact with 2 pairs of socks in the thick blanket.  She had her cardiologist check her for circulation issues per son, they said it was okay.  Of note, she is on gabapentin and Cymbalta, both low dose.   Previously:   I saw her on 10/27/2019, at which time she was fairly stable.  Sometimes she felt that her right side was a little weaker, she had residual weakness from the stroke in the past.  She was advised to look into using weighted utensils  to be able to feed herself a little better.   Her son called in the interim with concern of worsening tremor.  I also received a staff message from her primary care physician regarding this.  I did not suggest increasing her Mysoline as she is already on a high dose.    I saw her on 09/07/2018, at which time she was stable.  She had issues with back pain, she had been found to have osteopenia.  She had no recent falls.  She has had right-sided residual weakness from her stroke.  Her tremor has been somewhat progressive.  She had no recent seizures.    She saw Debbora Presto, nurse practitioner in the interim with a phone call visit on 04/26/2019, at which time she was stable.    I saw her on 07/08/2017 for a sooner than scheduled appointment because she felt that the tremor was worse, she also had intermittent leg cramps. She had issues with low back pain. She had a recent lumbar spine MRI. She was supposed to see Dr. Ernestina Patches for injection into the spine. She was advised to continue with low-dose gabapentin, Mysoline twice daily, and Keppra twice daily.     I saw her on 11/11/2016, at which time her tremor was mostly stable, she had no recent falls. Her right leg weakness was about the same as well. She did present to  the emergency room in November 2017 for back pain and leg pain. She was found to have a UTI.  She was also found on CT lumbar spine to have a L1 vertebral body fracture, old.    She was seen in the interim by Ward Givens on 05/13/2017, at which time she was noted to be fairly stable and her medications were kept the same.   She presented in the interim to the emergency room on 05/31/2017 for hip pain on the left. She saw her family physician on 07/02/2017.   I saw her on 05/08/2016, at which time she had recently fallen about 10 days prior. She had visited her grandmother daughter who is expecting a baby and fell backwards landed on her back. She had left-sided back pain since then. She  went to the emergency room on 05/04/2016 and was given an injection with Decadron, Toradol, Dilaudid, but did not have much in way of relief. She had a bone scan on 05/06/16, which I reviewed: IMPRESSION: 1. No evidence of a recent fracture. 2. No evidence of neoplastic disease to bone. 3. Significant degenerative uptake noted along spine as well as involving multiple bilateral joints. She reported that her tremor was worse. She had more stress and also more pain.   I suggested she continue with her Keppra, Plavix, Mysoline and Neurontin.   I saw her on 09/25/15, at which time she felt she was doing well. She was worried about her brother in [place]. She was not able to travel to [place] and was sad about it. She was planning to fly to [place] to be with her daughter and other family for some time. No recent seizures or strokelike symptoms were reported. Tremor was stable. She was using a cane and had no recent falls. I suggested we continue with Keppra, Plavix, gabapentin and Mysoline at the same doses.   I saw her on 04/26/2015 at which time she reported difficulty with her balance. Her tremor had become a little worse. She had had no recent seizures or auras. She was able to tolerate Keppra 500 mg twice daily. She was on the same dose of Mysoline for her tremors. She had benefited from physical therapy at [facility] and requested to go back for outpatient physical therapy. She had not fallen. She was using a 4 pronged cane and was no longer using her walker.   I saw her on 11/16/2014, at which time her son reported that she was doing well. She was in outpatient physical therapy and was using a 2 wheeled walker. She was taking gabapentin at a lower dose. Lowering gabapentin had improved her heavy headedness in the morning. Her tremor was stable. She was tolerating Keppra 500 mg twice daily with no recent seizures or auras reported. Overall she has done well. I continued her on the same dose of  primidone, the same dose of Keppra, and suggested we reduce her gabapentin further to 100 mg in the morning and 200 mg in the evening.    Of note, they canceled an appointment for 03/17/2015.   I saw her on 08/19/2014 at which time she presented after her hospitalization for stroke. Her son reported that Aggrenox was causing her headaches and she was switched to Plavix. We talked about sleep apnea screening. She had no witnessed apneas and only mild snoring. She had done fairly well. They requested a referral to outpatient therapy in [facility]. She was placed on Keppra for episodic twitching. She had  had some medication changes when she was hospitalized for her stroke. She was advised to use her 2 wheeled walker with assistance. I referred her to physical therapy outpatient. We talked about secondary stroke prevention. I suggested she reduce gabapentin to 200 mg twice daily down from 300 mg twice daily because of dizziness and head heaviness reported. This continued on Mysoline for her essential tremor.   I saw her on 10/27/2013 at which time she presented for followup of her long-standing history of essential tremor. I felt she was stable in that regard and felt that her diabetes had resulted in diabetic neuropathy. I asked her to continue with Mysoline at the same strength and increased her gabapentin to 3 pills at night for a total of 300 mg each bedtime.   In the interim she presented to the emergency room on 07/18/2014 with new onset right-sided weakness, facial droop, slurring of speech which started around 6 PM the night before. She was in [place], [place] visiting family when she had acute onset of the symptoms. She was seen in [place] at the hospital and CT head was negative for bleed but she did not receive TPA because of low platelet count of 90,000. She had improvement of her symptoms but persistent deficits. She was admitted to the hospital and had a complete stroke workup. She had  an MRI of the brain as well as MRA head and was found to have a left thalamic ischemic stroke. Neurology was consulted. MRA showed left PCA occlusion. Echocardiogram showed an EF of 60-65% with no wall motion abnormalities and no source of emboli. Carotid Doppler studies were negative for any significant ICA stenosis (1-39%). Hb A1c was 6. LDL was 62. She was placed on aspirin for secondary stroke prevention and then changed to Aggrenox. Blood work showed normal B12 levels, negative hepatitis serology, chest x-ray was negative. EKG did not show any ST abnormalities. She had some left leg pain. Lower extremity Doppler studies from 07/20/2014 were negative for DVT. She was discharged on 07/21/2014 to inpatient rehabilitation. She was discharged from rehabilitation on 08/02/2014. I reviewed hospital records as well as imaging tests. Her daughter called and asked for a sooner than scheduled appointment because of worsening weakness. However, she missed an appointment on 08/05/2014 because she was readmitted to the hospital on 08/04/2014 for slurring of speech and shaking spells. I reviewed the hospital records. She had an EEG, which was negative for seizure activity but she was felt to have partial onset seizures and was started on Keppra which is currently at 500 mg twice daily. She was changed from Aggrenox to Plavix 75 mg.   She had a brain MRI without contrast on 08/05/2014: 1. Persistent 1.1 cm focus of linear restricted diffusion within the lateral left thalamus near the posterior limb of the left internal capsule. Finding is favored to reflect resolving infarct from the 07/19/2014 study given the relative decreased signal intensity and size from that prior exam, although possible acute re-infarction of this area is not entirely excluded. 2. No other acute intracranial abnormality. In addition, I personally reviewed the images through the PACS system. She had an EEG on 08/06/2014: This was reported as normal  in the asleep and awake states. Her son called on 08/15/2014 to make an appointment.   I first met her on 01/19/2013, at which time I increased her Mysoline 250 mg strength 1-1/2 pills to 2 pills daily. I did advise her of the potential increase risk of side effects.  She previously followed with Dr. Morene Antu. She has a billateral UE and head tremor for over 25 years. She was tried on primidone while she was in [place] with improvement in tremor. It was discontinued when she came to the [place] because she was on Coumadin. Her tremor affects her ability to feed herself, write, and put on her makeup and is worse with anxiety. Her daughter has a tremor as well. She was restarted on primidone by Dr. Erling Cruz in 3/12. She is no longer on coumadin. She was on coumadin for a history of DVT in her left leg following left foot injury. She has had numbness in her legs without pain. EMG/NCV 02/27/11 was normal except for some denervation in the left lumbosacral paraspinal muscles. B12 level was normal. She is on gabapentin 100 mg 2 at night with relief of symptoms. Her major complaint is that of ongoing tremor, which is worse in the morning. She has not had side effects such as grogginess or difficulty walking with the primidone. She no longer cooks. Her tremor involves her writing and abilities to feed herself. She also continues to take gabapentin 135m 2 qHS for her neuropathy. She has not fallen per son. She was supposed to come back in 3 months but missed several appointments. She has been diagnosed with DM and has been started on metforim. She feels, her numbness and tingling is worse in her feet and she has now has started noticing burning sensation in her feet.   Her Past Medical History Is Significant For: Past Medical History:  Diagnosis Date  . Anxiety   . Diabetes ([place] 10/27/2013  . DVT (deep venous thrombosis) ([place]   . Essential tremor 10/27/2013  . High cholesterol 1999  . Hypertension   . [place]ic  stroke ([place] 07/18/2014  . Obesity, unspecified 10/27/2013  . Pneumonia   . Unspecified hereditary and idiopathic peripheral neuropathy 10/27/2013    Her Past Surgical History Is Significant For: Past Surgical History:  Procedure Laterality Date  . CATARACT EXTRACTION Bilateral   . FOOT SURGERY    . IR KYPHO LUMBAR INC FX REDUCE BONE BX UNI/BIL CANNULATION INC/IMAGING  06/15/2019    Her Family History Is Significant For: Family History  Problem Relation Age of Onset  . Stroke Mother   . Heart Problems Brother   . Coronary artery disease Other   . Heart Problems Sister     Her Social History Is Significant For: Social History   Socioeconomic History  . Marital status: Widowed    Spouse name: Not on file  . Number of children: 5  . Years of education: 9  . Highest education level: Not on file  Occupational History    Comment: does not work  Tobacco Use  . Smoking status: Never Smoker  . Smokeless tobacco: Never Used  Vaping Use  . Vaping Use: Never used  Substance and Sexual Activity  . Alcohol use: No    Alcohol/week: 0.0 standard drinks  . Drug use: No  . Sexual activity: Never  Other Topics Concern  . Not on file  Social History Narrative   Patient is right handed and resides with son   Social Determinants of Health   Financial Resource Strain: Not on file  Food Insecurity: Not on file  Transportation Needs: Not on file  Physical Activity: Not on file  Stress: Not on file  Social Connections: Not on file    Her Allergies Are:  Allergies  Allergen Reactions  .  Tape Rash    Please do not use Plastic Tape  :   Her Current Medications Are:  Outpatient Encounter Medications as of 03/01/2021  Medication Sig  . ACCU-CHEK AVIVA PLUS test strip USE AS INSTRUCTED TO TEST ONCE A DAY DX CODE:E11.9  . Accu-Chek Softclix Lancets lancets Use as instructed  . acetaminophen (TYLENOL) 325 MG tablet Take 2 tablets (650 mg total) by mouth every 6 (six) hours as needed.   . ALPRAZolam (XANAX) 0.25 MG tablet Take 0.25 mg by mouth 2 (two) times daily as needed for anxiety.   [name] amLODipine (NORVASC) 2.5 MG tablet Take 2.5 mg by mouth daily.  [name] atorvastatin (LIPITOR) 20 MG tablet Take 1 tablet (20 mg total) by mouth every evening.  [name] CALCIUM PO Take 1 tablet by mouth daily.  . Cholecalciferol (VITAMIN D PO) Take 1 tablet by mouth daily.  . clopidogrel (PLAVIX) 75 MG tablet TAKE 1 TABLET BY MOUTH EVERY DAY  . diclofenac Sodium (VOLTAREN) 1 % GEL APPLY 2 GRAMS TO AFFECTED AREA 4 TIMES A DAY  . DULoxetine (CYMBALTA) 20 MG capsule TAKE 1 CAPSULE BY MOUTH EVERY DAY  . furosemide (LASIX) 20 MG tablet Take 20 mg by mouth daily.  [name] gabapentin (NEURONTIN) 100 MG capsule TAKE 1 CAPSULE BY MOUTH AT BEDTIME.  [name] levETIRAcetam (KEPPRA) 500 MG tablet TAKE 1 TABLET BY MOUTH TWICE A DAY  . lidocaine (LIDODERM) 5 % PLACE 1 PATCH ONTO THE SKIN DAILY. REMOVE & DISCARD PATCH WITHIN 12 HOURS OR AS DIRECTED BY MD  . nystatin (MYCOSTATIN/NYSTOP) powder Apply topically 4 (four) times daily.  . Omega-3 Fatty Acids (FISH OIL) 1000 MG CAPS Take 1 capsule by mouth every other day.   [name] PAZEO 0.7 % SOLN Place 1 drop into both eyes at bedtime.   . potassium chloride SA (K-DUR,KLOR-CON) 20 MEQ tablet Take 20 mEq by mouth daily.   . primidone (MYSOLINE) 250 MG tablet TAKE 1 TABLET BY MOUTH TWICE A DAY  . RESTASIS 0.05 % ophthalmic emulsion Place 1 drop into both eyes 2 (two) times daily.   . traMADol-acetaminophen (ULTRACET) 37.5-325 MG tablet TAKE 2 TABLETS BY MOUTH EVERY 4 HOURS AS NEEDED FOR UP TO 7 DAYS.   No facility-administered encounter medications on file as of 03/01/2021.  :  Review of Systems:  Out of a complete 14 point review of systems, all are reviewed and negative with the exception of these symptoms as listed below: Review of Systems  Neurological:       Patient is here with her son for yearly follow-up of her tremors. They are a little bit worse. She also reports pain in the  bottom of her feet that feels like a needle. It is there all the time. The pain started about 2 months ago.     Objective:  Neurological Exam  Physical Exam Physical Examination:   Vitals:   03/01/21 1408  BP: 139/78  Pulse: 100    General Examination: The patient is a very pleasant 87 y.o. female in no acute distress. She appears well-developed and well-nourished and well groomed.   HEENT:Normocephalic, atraumatic, pupils are equal, round and reactive to light, hearing isgrosslyintact. Extraocular tracking is good without nystagmus. She has a moderate degree of headtremor, no-no type,appears stable. She has a moderate degree of voice tremor. Sheisnot dysarthric. Oropharynx is clear. She has mild airway crowding, dentures in place. Neckis supple, shows FROM.  Chest:Clear to auscultation without wheezing, rhonchi or crackles noted.  Heart:S1+S2+0, regular and normal  without murmurs, rubs or gallops noted.   Abdomen:Soft, non-tender and non-distended with normal bowel sounds appreciated on auscultation.  Extremities:There istrace pitting edema, right more than left around the ankles.   Skin: Warm and dry with mild swelling in the distal lower extremities noted, mild redness in the distal lower extremities, not well-circumscribed like an cellulitis, slight discoloration overall.    Musculoskeletal: exam reveals no obvious joint deformities, tenderness or joint swelling or erythema,no obvious pain.  Neurologically:  Mental status: The patient is awake, alert and oriented in all 4 spheres. His memory, attention, language and knowledge are appropriate, her son assists with translation. Mood is congruent and affect is normal.  Cranial nerves are as described above under HEENT exam.  Motor exam:thin bulk, global strength of 4 out of 5,perhaps a little weaker in R hip flexorandfoot dorsiflexion on the right, stable, but also mildly weaker in the left grip  strength. Fine motor skills are globally moderately impaired in the upper and lower extremities.  Rombergisnot tested for safetyreasons.She has a bilateral upper extremity postural and action tremor, more on the moderate side.  Mild resting tremor component.  Cerebellar testing shows no dysmetria or intention tremor.  Sensory exam: intact to light touch.  Gait, station and balance: She stands up slowlyand requiresa littleassistance, walks slowly and cautiously, tandem walk is not possible, balance is impaired. She has a moderately stooped position, walks more slowly and cautiously than previously.  She uses her 2 wheeled walker.  She does direct the right foot just a little bit.   Assessment and Plan:   In summary, [name] [name] is a very pleasant 87 year old female with a history of DVT, ET, stroke, partial seizures and obesity,who presents forfollow-up consultation of her essential tremor of many years duration. She has had no recent stroke-like Sx or seizure-like events. She istolerating her medicines.  She has had progression of her tremors.  She continues to take Mysoline 250 mg twice daily, Keppra 500 mg twice daily.  We had previously reduced her gabapentin over time gradually because of side effects.  She is currently on a low dose.  She has had more foot pain.  This could be a combination of vascular disease versus worsening neuropathy. She is also on low-dose Cymbalta.  I would not recommend increasing the medicines at this time for fear of side effects.  She is advised to maintain her current medication regimen.  We talked about the importance of fall prevention.  She is advised to continue to try to hydrate well with water, stay well rested and eat nutritious food.  She is advised to follow-up routinely in 1 year, sooner if needed.  I answered all her questions today and the patient and her son were in agreement. I spent 25 minutes in total face-to-face time and in reviewing  records during pre-charting, more than 50% of which was spent in counseling and coordination of care, reviewing test results, reviewing medications and treatment regimen and/or in discussing or reviewing the diagnosis of ET, Sz d/o, the prognosis and treatment options. Pertinent laboratory and imaging test results that were available during this visit with the patient were reviewed by me and considered in my medical decision making (see chart for details).

## 2021-03-04 ENCOUNTER — Other Ambulatory Visit: Payer: Self-pay | Admitting: Neurology

## 2021-03-23 ENCOUNTER — Telehealth: Payer: Self-pay

## 2021-03-23 NOTE — Telephone Encounter (Signed)
Received fax from pharmacy, PA needed on Diclofenac Sodium 1% gel.  Clinical questions submitted via Cover My Meds.  Waiting on response, could take up to 72 hours.  Cover My Meds info: Key: Suszanne Conners, RN

## 2021-03-23 NOTE — Telephone Encounter (Signed)
PA approved for Diclofenac Sodium 1% gel. Approval dates: 11/25/2020-03/23/2022.   Pharmacy called and informed.   Veronda Prude, RN

## 2021-04-13 ENCOUNTER — Other Ambulatory Visit: Payer: Self-pay | Admitting: Neurology

## 2021-04-13 ENCOUNTER — Other Ambulatory Visit: Payer: Self-pay | Admitting: Radiology

## 2021-04-13 MED ORDER — DULOXETINE HCL 20 MG PO CPEP: ORAL_CAPSULE | ORAL | 2 refills | Status: DC

## 2021-06-08 ENCOUNTER — Other Ambulatory Visit: Payer: Self-pay

## 2021-06-08 ENCOUNTER — Ambulatory Visit (INDEPENDENT_AMBULATORY_CARE_PROVIDER_SITE_OTHER): Payer: Medicare Other | Admitting: Student

## 2021-06-08 VITALS — BP 157/81 | HR 76 | Ht 62.0 in | Wt 145.8 lb

## 2021-06-08 DIAGNOSIS — R109 Unspecified abdominal pain: Secondary | ICD-10-CM | POA: Diagnosis present

## 2021-06-08 NOTE — Patient Instructions (Addendum)
It was great to see you! Thank you for allowing me to participate in your care!   Our plans for today:  - To rest and use ice on back, and take Tylenol as needed - Physical Therapy referral - Watch for 2 weeks, if no better or things get worse, please return to clinic - If things get worse or you get new symptoms please come in or consider going to emergency department - Follow up with neurology about changes in gait   Take care and seek immediate care sooner if you develop any concerns.   Dr. Bess Kinds, MD [facility]

## 2021-06-08 NOTE — Progress Notes (Signed)
    SUBJECTIVE:   CHIEF COMPLAINT / HPI:   Right sided pain  Patient traveled out of state and caught a cold while traveling. She cleared the cold and retunred home. The next day, upon returning home, she appreciated sharp pain on he rright side ranging from her ribs to her hips. She denies any trauma or falls. Pain is constant but worse when she is active (rising from seated, getting out of bed, walking). IT's affecting her walking. She takes tylenol for relief and has seen good benefit for short times. Patient reports no histroy of truama or fall. Denies any burning or tingling sensations at area. No pain with breathing. Patient has no changes in bowel or urinary habits, denies any change in quality, smell, or ease of urination.   PHQ9 scored 3   PERTINENT  PMH / PSH: T2DM, HLD, HTN, Hx CVA, CHF  OBJECTIVE:   Today's Vitals   06/08/21 1447  BP: (!) 157/81  Pulse: 76  SpO2: 97%  Weight: 145 lb 12.8 oz (66.1 kg)  Height: 5\' 2"  (1.575 m)  PainSc: 10-Worst pain ever  PainLoc: Abdomen   Body mass index is 26.67 kg/m.   Physical Exam Back: no deformities or lesions, back looks wnl muscles are equal bilaterally, mildly TTP, area extends from mid ribs to hips. Negative costal vertebral angle tenderness Heart: normal s1/s2, no rubs, murmurs or gallops Lungs: Lungs CTABL Neuro: Strength and sensation in face and extremities wnl.   ASSESSMENT/PLAN:   Right sided flank pain: Patient has no sign or symptoms of a UTI or Cauda Equina syndrome, most likely overuse injury -Rest and watch for 2 weeks, f/u if not better or worsening -Use heat, ice and Tylenol as needed -Referral placed to Physical Therapy   , MD [facility]

## 2021-06-08 NOTE — Assessment & Plan Note (Signed)
Right sided flank pain: Patient has no sign or symptoms of a UTI or Cauda Equina syndrome, most likely overuse injury -Rest and watch for 2 weeks, f/u if not better or worsening -Use heat, ice and Tylenol as needed -Referral placed to Physical Therapy

## 2021-06-19 ENCOUNTER — Ambulatory Visit: Payer: Medicare Other | Attending: Family Medicine

## 2021-06-27 ENCOUNTER — Ambulatory Visit: Payer: Medicare Other | Attending: Family Medicine

## 2021-06-27 ENCOUNTER — Other Ambulatory Visit: Payer: Self-pay

## 2021-06-27 DIAGNOSIS — R293 Abnormal posture: Secondary | ICD-10-CM | POA: Diagnosis present

## 2021-06-27 DIAGNOSIS — M545 Low back pain, unspecified: Secondary | ICD-10-CM | POA: Diagnosis not present

## 2021-06-27 DIAGNOSIS — R262 Difficulty in walking, not elsewhere classified: Secondary | ICD-10-CM | POA: Insufficient documentation

## 2021-06-27 NOTE — Patient Instructions (Signed)
Access Code: [email or web address] URL: [email or web address] Date: 06/27/2021 Prepared by: Sharol Roussel  Exercises Seated Flexion Stretch with Swiss Ball - 2 x daily - 7 x weekly - 1 sets - 3 reps - 20 hold Seated Piriformis Stretch - 2 x daily - 7 x weekly - 1 sets - 2 reps - 20 hold Seated Piriformis Stretch with Trunk Bend - 2 x daily - 7 x weekly - 1 sets - 2 reps - 20 hold

## 2021-06-27 NOTE — Therapy (Signed)
[facility] [address] [place], [place], [address] Phone: [phone]   Fax:  [phone]  Physical Therapy Evaluation  Patient Details  Name: [name] HoleFatemeh B Sarratt MRN: [id number] Date of Birth: 1930-09-23 Referring Provider (PT): Doreene ElandEniola, Kehinde T, MD   Encounter Date: 06/27/2021   PT End of Session - 06/27/21 1442     Visit Number 1    Number of Visits 12    Date for PT Re-Evaluation 08/07/21    Authorization Type MCR - FOTO on 6th and 10th visit    Progress Note Due on Visit 10    PT Start Time 1328    PT Stop Time 1420    PT Time Calculation (min) 52 min    Equipment Utilized During Treatment Other (comment)   FWW   Activity Tolerance Patient tolerated treatment well;Patient limited by pain    Behavior [facility] Therapy [facility] for tasks assessed/performed             Past Medical History:  Diagnosis Date   Anxiety    Diabetes (HCC) 10/27/2013   DVT (deep venous thrombosis) (HCC)    Essential tremor 10/27/2013   High cholesterol 1999   Hypertension    Ischemic stroke (HCC) 07/18/2014   Obesity, unspecified 10/27/2013   Pneumonia    Unspecified hereditary and idiopathic peripheral neuropathy 10/27/2013    Past Surgical History:  Procedure Laterality Date   CATARACT EXTRACTION Bilateral    FOOT SURGERY     IR KYPHO LUMBAR INC FX REDUCE BONE BX UNI/BIL CANNULATION INC/IMAGING  06/15/2019    There were no vitals filed for this visit.    Subjective Assessment - 06/27/21 1330     Subjective The patient reports that she has been having pain in the right side of the back and into the leg for about a month.  She had a cough around that time. The pain is constant.  It will incresae of walking, laying down, and prolonged sitting.   Her pain can radiate all the way to the foot.  She denies numbness and tingling.  There is increase of pain when she sits for a long time and then goes to stand up.  Her husband reports generalized  weakness with walking.    Patient is accompained by: Family member    Limitations Sitting;Standing;Walking;House hold activities    How long can you sit comfortably? 45 minutes    How long can you stand comfortably? 5-10 minutes    How long can you walk comfortably? 5-10 minutes    Diagnostic tests none    Patient Stated Goals reduce the pain and walk more    Currently in Pain? Yes   at worse in last week 7/10 with walking   Pain Score 3     Pain Location Back    Pain Orientation Right;Mid;Lower    Pain Descriptors / Indicators Sharp    Pain Radiating Towards right foot    Aggravating Factors  walking, standing, prolong sitting, and laying.    Pain Relieving Factors tylenol    Effect of Pain on Daily Activities Limited walking duration                [facility] - 06/27/21 0001       Assessment   Medical Diagnosis R10.9 (ICD-10-CM) - Acute right flank pain    Referring Provider (PT) Doreene ElandEniola, Kehinde T, MD    Onset Date/Surgical Date 05/23/21    Hand Dominance Right  Prior Therapy yes      Precautions   Precautions None      Restrictions   Weight Bearing Restrictions No      Balance Screen   Has the patient fallen in the past 6 months No    Has the patient had a decrease in activity level because of a fear of falling?  Yes    Is the patient reluctant to leave their home because of a fear of falling?  Yes      Home Environment   Living Environment Private residence    Living Arrangements Spouse/significant other    Home Access Stairs to enter    Entrance Stairs-Number of Steps 2-3    Home Layout One level      Prior Function   Level of Independence Independent with basic ADLs;Independent with household mobility with device    Vocation Retired      IT consultant   Overall Cognitive Status Within Functional Limits for tasks assessed      Observation/Other Assessments   Observations visible resting tremor    Focus on Therapeutic Outcomes (FOTO)  25%  lumbar - Predict 51%      Posture/Postural Control   Posture/Postural Control Postural limitations    Postural Limitations Rounded Shoulders;Forward head;Decreased lumbar lordosis;Increased thoracic kyphosis;Flexed trunk      ROM / Strength   AROM / PROM / Strength AROM;Strength      AROM   Lumbar Flexion 75    Lumbar Extension 0    Lumbar - Right Side Bend pain aggravated    Lumbar - Left Side Bend WFL    Lumbar - Right Rotation 50%    Lumbar - Left Rotation 50%      Strength   Right Hip Flexion 4+/5    Left Hip Flexion 4+/5    Right Knee Flexion 5/5    Right Knee Extension 4+/5    Left Knee Flexion 5/5    Left Knee Extension 4+/5    Right Ankle Dorsiflexion 3+/5    Left Ankle Dorsiflexion 5/5      Transfers   Transfers Sit to Stand    Sit to Stand 5: Supervision;With upper extremity assist;With armrests      Ambulation/Gait   Ambulation/Gait Yes    Ambulation/Gait Assistance 5: Supervision    Ambulation Distance (Feet) 80 Feet    Assistive device Rolling walker    Gait Pattern Shuffle;Trunk flexed;Decreased trunk rotation    Gait Comments arms extended out with walker at arms length from body.   walker height at patient's elbow level                       Objective measurements completed on examination: See above findings.                 PT Short Term Goals - 06/27/21 1420       PT SHORT TERM GOAL #1   Title The patient will be independent with a basic HEP of stretches.    Baseline no HEP    Time 2    Period Weeks    Status New    Target Date 07/11/21               PT Long Term Goals - 06/27/21 1421       PT LONG TERM GOAL #1   Title The patient will be able to perform right lumbar side bending without pain increase.    Baseline pain inrease  4/10    Time 6    Period Weeks    Status New    Target Date 07/11/21      PT LONG TERM GOAL #2   Title The patient will be able to sit for 45 minutes and then stand up with  back pain under or equal to 3/10    Baseline 5/10    Time 6    Period Weeks    Status New    Target Date 08/08/21      PT LONG TERM GOAL #3   Title The patient will walk x100-120 feet within normal distance of walker SBA for mobility to appointments.    Baseline forward flexed posture w/ walker at elbow height and arms extended forward x 80 feet.    Time 6    Period Weeks    Status New    Target Date 08/08/21                    Plan - 06/27/21 1344     Clinical Impression Statement Hedgecock is a 87 y/o female who reports onset of right sided back pain about a month ago.  She reports that she was sick and coughing a lot.  The patient's cough went away, but the right sided pain stayed.   The pain is constant and increases with activities.  She reports pain increase with walking, laying down, and standing after prolong sitting.  The patient presents with an increase of thoracic kyphosis and rounded shoulder/forward posture.  She has a resting temor that is present during movement.  She tends to walk with flexed posture and the walker in front of her at arms length.  She has pain increase with right lumbar side bending and lumbar extension motion.  The patient reports of past medical history of a CVA afftecting the right side.  She presents today with weakness of the right ankle doris flexors.  Recommend physical therapy for gait training, strengthening, and flexibility to reduce pain and improve mobility/transfers.    Personal Factors and Comorbidities Comorbidity 3+    Comorbidities DM Type II, Congestive heart failure, Ostepenia, history of CVA affecting R side    Examination-Activity Limitations Bed Mobility;Locomotion Level;Transfers;Carry;Lift;Stand;Stairs;Squat    Examination-Participation Restrictions Church;Meal Prep;Cleaning;Occupation;Shop;Laundry    Stability/Clinical Decision Making Evolving/Moderate complexity    Clinical Decision Making Moderate    Rehab Potential Fair     PT Frequency 2x / week    PT Duration 6 weeks    PT Treatment/Interventions ADLs/Self Care Home Management;Cryotherapy;Moist Heat;Electrical Stimulation;Gait training;Stair training;Functional mobility training;Therapeutic activities;Therapeutic exercise;Balance training;Neuromuscular re-education;Manual techniques;Patient/family education;Taping;Dry needling    PT Next Visit Plan Adjust/lower walker height, gait training with FWW, lumbar/hips stretches, core stabilization, ankle DF strengthening,    PT Home Exercise Plan Access Code: [email or web address]    Consulted and Agree with Plan of Care Patient;Family member/caregiver    Family Member Consulted Husband             Patient will benefit from skilled therapeutic intervention in order to improve the following deficits and impairments:  Abnormal gait, Decreased range of motion, Difficulty walking, Decreased activity tolerance, Pain, Improper body mechanics, Decreased mobility, Decreased strength, Postural dysfunction  Visit Diagnosis: Right low back pain, unspecified chronicity, unspecified whether sciatica present  Difficulty in walking, not elsewhere classified  Abnormal posture     Problem List Patient Active Problem List   Diagnosis Date Noted   Urinary frequency 12/06/2020   History of CVA (cerebrovascular accident) 04/26/2019  CHF (congestive heart failure), NYHA class III, chronic, diastolic (HCC) 11/11/2018   Hypoxemia requiring supplemental oxygen 11/10/2018   Fatigue 11/10/2018   Osteopenia of multiple sites 11/10/2018   Acute flank pain 08/07/2018   Umbilical pain 10/31/2017   Back pain 10/10/2016   Pelvic pain 10/10/2016   Seizures (HCC) 08/06/2014   Abnormal involuntary movement 08/05/2014   Hemiplegia, unspecified, affecting dominant side 08/05/2014   Right sided weakness 08/05/2014   CVA (cerebral vascular accident) (HCC) 07/21/2014   Left leg pain 07/20/2014   HTN (hypertension) 07/18/2014   HLD  (hyperlipidemia) 07/18/2014   Thrombocytopenia (HCC) 07/18/2014   Essential tremor 10/27/2013   Obesity, unspecified 10/27/2013   DM2 (diabetes mellitus, type 2) (HCC) 10/27/2013   History of peripheral neuropathy 10/27/2013   Sharol Roussel, PT, DPT, OCS, Crt. DN Robet Leu 06/27/2021, 2:51 PM  [facility] [address] [place], [place], [address] Phone: [phone]   Fax:  [phone]  Name: TERI LEGACY MRN: [id number] Date of Birth: 12-Feb-1930

## 2021-07-06 ENCOUNTER — Other Ambulatory Visit: Payer: Self-pay

## 2021-07-06 ENCOUNTER — Ambulatory Visit: Payer: Medicare Other

## 2021-07-06 DIAGNOSIS — R262 Difficulty in walking, not elsewhere classified: Secondary | ICD-10-CM

## 2021-07-06 DIAGNOSIS — M545 Low back pain, unspecified: Secondary | ICD-10-CM

## 2021-07-06 DIAGNOSIS — R293 Abnormal posture: Secondary | ICD-10-CM

## 2021-07-06 NOTE — Therapy (Signed)
[facility] [address] [place], [place], [address] Phone: [phone]   Fax:  [phone]  Physical Therapy Treatment  [name] Details  Name: [name] [name] MRN: [id number] Date of Birth: 1930-01-13 Referring Provider (PT): Doreene Eland, MD   Encounter Date: 07/06/2021   PT End of Session - 07/06/21 1101     Visit Number 2    Number of Visits 12    Date for PT Re-Evaluation 08/07/21    Authorization Type MCR - FOTO on 6th and 10th visit    PT Start Time 1101    PT Stop Time 1143    PT Time Calculation (min) 42 min    Activity Tolerance [name] tolerated treatment well;[name] limited by pain    Behavior During Therapy [facility] for tasks assessed/performed             Past Medical History:  Diagnosis Date   Anxiety    Diabetes (HCC) 10/27/2013   DVT (deep venous thrombosis) (HCC)    Essential tremor 10/27/2013   High cholesterol 1999   Hypertension    Ischemic stroke (HCC) 07/18/2014   Obesity, unspecified 10/27/2013   Pneumonia    Unspecified hereditary and idiopathic peripheral neuropathy 10/27/2013    Past Surgical History:  Procedure Laterality Date   CATARACT EXTRACTION Bilateral    FOOT SURGERY     IR KYPHO LUMBAR INC FX REDUCE BONE BX UNI/BIL CANNULATION INC/IMAGING  06/15/2019    There were no vitals filed for this visit.   Subjective Assessment - 07/06/21 1155     Subjective [name] [name] reports that [name] [name] is feeling a little bit better.  [name] [name] has been doing [name] [name] some at home.    Currently in Pain? Yes    Pain Score 3     Pain Location Back                [facility] PT Assessment - 07/06/21 0001       Assessment   Medical Diagnosis R10.9 (ICD-10-CM) - Acute right flank pain    Referring Provider (PT) Doreene Eland, MD    Onset Date/Surgical Date 05/23/21    Hand Dominance Right                           [facility] Adult PT Treatment/Exercise - 07/06/21 0001        Ambulation/Gait   Ambulation/Gait Yes    Ambulation/Gait Assistance 5: Supervision    Ambulation Distance (Feet) 80 Feet    Assistive device Rolling walker    Gait Pattern Shuffle;Trunk flexed;Decreased trunk rotation      [name]   [name] Lumbar      Lumbar [name]: Stretches   Piriformis Stretch 20 seconds;3 reps    Piriformis Stretch Limitations sitting    Figure 4 Stretch 20 seconds;3 reps    Figure 4 Stretch Limitations sitting    Other Lumbar Stretch Exercise Sitting FF w/ SB 10" x 5      Lumbar [name]: Seated   Other Seated Lumbar [name] Alternate marches 20 x 2    Other Seated Lumbar [name] Sitting hip adduction 5" x 10 , GTB abd x 20      Shoulder [name]: Seated   Row 20 reps;Theraband    Theraband Level (Shoulder Row) Level 2 (Red)      Manual Therapy   Manual Therapy Soft tissue mobilization    Soft tissue mobilization IASTM to [name] lumbar paraspinals and  QL                      PT Short Term Goals - 06/27/21 1420       PT SHORT TERM GOAL #1   Title [name] [name] will be independent with a basic [name] of stretches.    Baseline no [name]    Time 2    Period Weeks    Status New    Target Date 07/11/21               PT Long Term Goals - 06/27/21 1421       PT LONG TERM GOAL #1   Title [name] [name] will be able to perform right lumbar side bending without pain increase.    Baseline pain inrease 4/10    Time 6    Period Weeks    Status New    Target Date 07/11/21      PT LONG TERM GOAL #2   Title [name] [name] will be able to sit for 45 minutes and then stand up with back pain under or equal to 3/10    Baseline 5/10    Time 6    Period Weeks    Status New    Target Date 08/08/21      PT LONG TERM GOAL #3   Title [name] [name] will walk x100-120 feet within normal distance of walker SBA for mobility to appointments.    Baseline forward flexed posture w/ walker at elbow height and arms extended forward x 80 feet.    Time  6    Period Weeks    Status New    Target Date 08/08/21                   Plan - 07/06/21 1101     Clinical Impression Statement [name] [name].  [name] [name].  Progressed with core strengthening in sitting.  Reviewed [name] [name]'s [name] and added sitting alternate marches to her [name].  [name] [name].  [name] [name].  Continue therapy for reduction of back pain with ADLs.    Personal Factors and Comorbidities Comorbidity 3+    Comorbidities DM Type II, Congestive heart failure, Ostepenia, history of CVA affecting R side    Examination-Activity Limitations Bed Mobility;Locomotion Level;Transfers;Carry;Lift;Stand;Stairs;Squat    PT Treatment/Interventions ADLs/Self Care Home Management;Cryotherapy;Moist Heat;Electrical Stimulation;Gait training;Stair training;Functional mobility training;Therapeutic activities;Therapeutic exercise;Balance training;Neuromuscular re-education;Manual techniques;[name]/family education;Taping;Dry needling    PT Next Visit Plan Adjust/lower walker height, gait training with FWW, lumbar/hips stretches, core stabilization, ankle DF strengthening, Add sitting rows to [name]    PT Home Exercise Plan Access Code: [email or web address]    Consulted and Agree with Plan of Care [name];Family member/caregiver    Family Member Consulted Husband             [name] will benefit from skilled therapeutic intervention in order to improve [name] following deficits and impairments:  Abnormal gait, Decreased range of motion, Difficulty walking, Decreased activity tolerance, Pain, Improper body mechanics, Decreased mobility, Decreased strength, Postural dysfunction  Visit Diagnosis: Right low back pain, unspecified chronicity, unspecified whether  sciatica present  Difficulty in walking, not elsewhere classified  Abnormal posture     Problem List [name] Active Problem List   Diagnosis Date Noted  Urinary frequency 12/06/2020   History of CVA (cerebrovascular accident) 04/26/2019   CHF (congestive heart failure), NYHA class III, chronic, diastolic (HCC) 11/11/2018   Hypoxemia requiring supplemental oxygen 11/10/2018   Fatigue 11/10/2018   Osteopenia of multiple sites 11/10/2018   Acute flank pain 08/07/2018   Umbilical pain 10/31/2017   Back pain 10/10/2016   Pelvic pain 10/10/2016   Seizures (HCC) 08/06/2014   Abnormal involuntary movement 08/05/2014   Hemiplegia, unspecified, affecting dominant side 08/05/2014   Right sided weakness 08/05/2014   CVA (cerebral vascular accident) (HCC) 07/21/2014   Left leg pain 07/20/2014   HTN (hypertension) 07/18/2014   HLD (hyperlipidemia) 07/18/2014   Thrombocytopenia (HCC) 07/18/2014   Essential tremor 10/27/2013   Obesity, unspecified 10/27/2013   DM2 (diabetes mellitus, type 2) (HCC) 10/27/2013   History of peripheral neuropathy 10/27/2013   Sharol Roussel, PT, DPT, OCS, Crt. DN  Robet Leu 07/06/2021, 11:56 AM  [facility] [address] [place], [place], [address] Phone: [phone]   Fax:  [phone]  Name: TARALYNN QUIETT MRN: [id number] Date of Birth: 04-30-1930

## 2021-07-10 ENCOUNTER — Telehealth: Payer: Self-pay | Admitting: Family Medicine

## 2021-07-10 ENCOUNTER — Other Ambulatory Visit: Payer: Self-pay

## 2021-07-10 ENCOUNTER — Ambulatory Visit: Payer: Medicare Other

## 2021-07-10 DIAGNOSIS — M545 Low back pain, unspecified: Secondary | ICD-10-CM

## 2021-07-10 DIAGNOSIS — R262 Difficulty in walking, not elsewhere classified: Secondary | ICD-10-CM

## 2021-07-10 DIAGNOSIS — R293 Abnormal posture: Secondary | ICD-10-CM

## 2021-07-10 NOTE — Therapy (Signed)
[facility] [address] [place], [place], [address] Phone: [phone]   Fax:  [phone]  Physical Therapy Treatment  Patient Details  Name: [name] [name] MRN: [id number] Date of Birth: January 14, 1930 Referring Provider (PT): Doreene Eland, MD   Encounter Date: 07/10/2021   PT End of Session - 07/10/21 1238     Visit Number 3    Number of Visits 12    Date for PT Re-Evaluation 08/07/21    Authorization Type MCR - FOTO on 6th and 10th visit    PT Start Time 1102    PT Stop Time 1143    PT Time Calculation (min) 41 min    Activity Tolerance Patient tolerated treatment well;Patient limited by pain    Behavior During Therapy [facility] for tasks assessed/performed             Past Medical History:  Diagnosis Date   Anxiety    Diabetes (HCC) 10/27/2013   DVT (deep venous thrombosis) (HCC)    Essential tremor 10/27/2013   High cholesterol 1999   Hypertension    Ischemic stroke (HCC) 07/18/2014   Obesity, unspecified 10/27/2013   Pneumonia    Unspecified hereditary and idiopathic peripheral neuropathy 10/27/2013    Past Surgical History:  Procedure Laterality Date   CATARACT EXTRACTION Bilateral    FOOT SURGERY     IR KYPHO LUMBAR INC FX REDUCE BONE BX UNI/BIL CANNULATION INC/IMAGING  06/15/2019    There were no vitals filed for this visit.       [facility] PT Assessment - 07/10/21 0001       Assessment   Medical Diagnosis R10.9 (ICD-10-CM) - Acute right flank pain    Referring Provider (PT) Doreene Eland, MD    Onset Date/Surgical Date 05/23/21    Hand Dominance Right                           [facility] Adult PT Treatment/Exercise - 07/10/21 0001       Posture/Postural Control   Posture/Postural Control Postural limitations    Postural Limitations Rounded Shoulders;Forward head;Decreased lumbar lordosis;Increased thoracic kyphosis;Flexed trunk      Lumbar Exercises: Stretches   Piriformis  Stretch 20 seconds;3 reps    Piriformis Stretch Limitations sitting    Other Lumbar Stretch Exercise Sitting FF/Rot  w/ SB 20" x 2 each      Lumbar Exercises: Seated   Other Seated Lumbar Exercises Alternate marches 20 x 2    Other Seated Lumbar Exercises Sitting hip adduction 5" x 10 , GTB abd x 20      Shoulder Exercises: Seated   Row 20 reps;Theraband    Theraband Level (Shoulder Row) Level 2 (Red)                      PT Short Term Goals - 06/27/21 1420       PT SHORT TERM GOAL #1   Title The patient will be independent with a basic HEP of stretches.    Baseline no HEP    Time 2    Period Weeks    Status New    Target Date 07/11/21               PT Long Term Goals - 06/27/21 1421       PT LONG TERM GOAL #1   Title The patient will be able to perform right lumbar side bending without pain  increase.    Baseline pain inrease 4/10    Time 6    Period Weeks    Status New    Target Date 07/11/21      PT LONG TERM GOAL #2   Title The patient will be able to sit for 45 minutes and then stand up with back pain under or equal to 3/10    Baseline 5/10    Time 6    Period Weeks    Status New    Target Date 08/08/21      PT LONG TERM GOAL #3   Title The patient will walk x100-120 feet within normal distance of walker SBA for mobility to appointments.    Baseline forward flexed posture w/ walker at elbow height and arms extended forward x 80 feet.    Time 6    Period Weeks    Status New    Target Date 08/08/21                   Plan - 07/10/21 1130     Clinical Impression Statement The patient seems to be making progress in therapy.  She still has tenderness and hypertonicity to the QL bilaterally.  Continue to perform exercises and manual therapy in sitting.  Progressed core stabilization and stretches.  Adjustments were made to the patient's walker by lowering it as it was too high.  Her walker continutes to remian higher then the  recommended height.  The patient reports that she did not want is lower at this time.  Recommend continued therapy for reduction of low back pain with ADLs.    Personal Factors and Comorbidities Comorbidity 3+    Comorbidities DM Type II, Congestive heart failure, Ostepenia, history of CVA affecting R side    Examination-Participation Restrictions Church;Meal Prep;Cleaning;Occupation;Shop;Laundry    PT Treatment/Interventions ADLs/Self Care Home Management;Cryotherapy;Moist Heat;Electrical Stimulation;Gait training;Stair training;Functional mobility training;Therapeutic activities;Therapeutic exercise;Balance training;Neuromuscular re-education;Manual techniques;Patient/family education;Taping;Dry needling    PT Next Visit Plan Adjust/lower walker height, gait training with FWW, lumbar/hips stretches, core stabilization, ankle DF strengthening, Add sitting rows to HEP    PT Home Exercise Plan Access Code: [email or web address]    Consulted and Agree with Plan of Care Patient             Patient will benefit from skilled therapeutic intervention in order to improve the following deficits and impairments:  Abnormal gait, Decreased range of motion, Difficulty walking, Decreased activity tolerance, Pain, Improper body mechanics, Decreased mobility, Decreased strength, Postural dysfunction  Visit Diagnosis: Right low back pain, unspecified chronicity, unspecified whether sciatica present  Difficulty in walking, not elsewhere classified  Abnormal posture     Problem List Patient Active Problem List   Diagnosis Date Noted   Urinary frequency 12/06/2020   History of CVA (cerebrovascular accident) 04/26/2019   CHF (congestive heart failure), NYHA class III, chronic, diastolic (HCC) 11/11/2018   Hypoxemia requiring supplemental oxygen 11/10/2018   Fatigue 11/10/2018   Osteopenia of multiple sites 11/10/2018   Acute flank pain 08/07/2018   Umbilical pain 10/31/2017   Back pain 10/10/2016   Pelvic pain  10/10/2016   Seizures (HCC) 08/06/2014   Abnormal involuntary movement 08/05/2014   Hemiplegia, unspecified, affecting dominant side 08/05/2014   Right sided weakness 08/05/2014   CVA (cerebral vascular accident) (HCC) 07/21/2014   Left leg pain 07/20/2014   HTN (hypertension) 07/18/2014   HLD (hyperlipidemia) 07/18/2014   Thrombocytopenia (HCC) 07/18/2014   Essential tremor 10/27/2013   Obesity, unspecified 10/27/2013  DM2 (diabetes mellitus, type 2) (HCC) 10/27/2013   History of peripheral neuropathy 10/27/2013   Sharol Roussel, PT, DPT, OCS, Crt. DN  Robet Leu 07/10/2021, 12:39 PM  [facility] [address] [place], [place], [address] Phone: [phone]   Fax:  [phone]  Name: [name] [name] MRN: [id number] Date of Birth: 12/24/1929

## 2021-07-10 NOTE — Telephone Encounter (Signed)
[place] [employer] Community [employer]  form dropped off for at front desk for completion.  Verified that patient section of form has been completed.  Last DOS/WCC with PCP was 06/08/21.  Placed form in team folder to be completed by clinical staff.  [name] [name]

## 2021-07-11 NOTE — Telephone Encounter (Signed)
Forms completed, placed in RN triage box

## 2021-07-12 ENCOUNTER — Ambulatory Visit: Payer: Medicare Other

## 2021-07-12 ENCOUNTER — Other Ambulatory Visit: Payer: Self-pay

## 2021-07-12 DIAGNOSIS — R262 Difficulty in walking, not elsewhere classified: Secondary | ICD-10-CM

## 2021-07-12 DIAGNOSIS — R293 Abnormal posture: Secondary | ICD-10-CM

## 2021-07-12 DIAGNOSIS — M545 Low back pain, unspecified: Secondary | ICD-10-CM

## 2021-07-12 NOTE — Therapy (Signed)
[facility] [address] [place], [place], [address] Phone: [phone]   Fax:  [phone]  Physical Therapy Treatment  Patient Details  Name: [name] [name] MRN: [id number] Date of Birth: [birth year] Referring Provider (PT): Doreene Eland, MD   Encounter Date: 07/12/2021   PT End of Session - 07/12/21 1107     Visit Number 4    Number of Visits 12    Date for PT Re-Evaluation 08/07/21    Authorization Type MCR - FOTO on 6th and 10th visit    Progress Note Due on Visit 10    PT Start Time 1101    PT Stop Time 1145    PT Time Calculation (min) 44 min    Activity Tolerance Patient tolerated treatment well    Behavior During Therapy [facility] for tasks assessed/performed             Past Medical History:  Diagnosis Date   Anxiety    Diabetes (HCC) 10/27/2013   DVT (deep venous thrombosis) (HCC)    Essential tremor 10/27/2013   High cholesterol 1999   Hypertension    Ischemic stroke (HCC) 07/18/2014   Obesity, unspecified 10/27/2013   Pneumonia    Unspecified hereditary and idiopathic peripheral neuropathy 10/27/2013    Past Surgical History:  Procedure Laterality Date   CATARACT EXTRACTION Bilateral    FOOT SURGERY     IR KYPHO LUMBAR INC FX REDUCE BONE BX UNI/BIL CANNULATION INC/IMAGING  06/15/2019    There were no vitals filed for this visit.   Subjective Assessment - 07/12/21 1106     Subjective The patient's reports that his wife has been complainting of left knee pain and swelling.  She is doing her exercises at home.    Patient is accompained by: Family member    Currently in Pain? Yes    Pain Score 4     Pain Location Knee    Pain Orientation Right    Pain Descriptors / Indicators Aching    Multiple Pain Sites Yes    Pain Score 3    Pain Location Back    Pain Orientation Right;Left                [facility] PT Assessment - 07/12/21 0001       Assessment   Medical Diagnosis R10.9 (ICD-10-CM)  - Acute right flank pain    Referring Provider (PT) Doreene Eland, MD    Onset Date/Surgical Date 05/23/21    Hand Dominance Right      AROM   Lumbar Flexion 75    Lumbar Extension 0    Lumbar - Right Side Bend WFL pain aggravated mild    Lumbar - Left Side Bend [facility]    Lumbar - Right Rotation 50%    Lumbar - Left Rotation 50%                           [facility] Adult PT Treatment/Exercise - 07/12/21 0001       Lumbar Exercises: Stretches   Passive Hamstring Stretch 20 seconds;3 reps;Left    Passive Hamstring Stretch Limitations Sitting    Other Lumbar Stretch Exercise Sitting FF/Rot  w/ SB 20" x 2 each      Lumbar Exercises: Seated   Long Arc Quad on Chair 15 reps;Left;Right    LAQ on Chair Limitations with ball squeeze    Other Seated Lumbar Exercises Alternate marches 20 x 2 (sitting  on AirEx)    Other Seated Lumbar Exercises Sitting hip abd , GTB abd x 20 x 2      Shoulder Exercises: Seated   Row 20 reps;Theraband    Theraband Level (Shoulder Row) Level 2 (Red)    Row Limitations Sitting on AirEx    Horizontal ABduction Both;15 reps    Horizontal ABduction Weight (lbs) sitting on AirEx    Other Seated Exercises Right ankle DF RTB 15 x 2      Manual Therapy   Soft tissue mobilization IASTM to the lumbar paraspinals and QL                      PT Short Term Goals - 07/12/21 1149       PT SHORT TERM GOAL #1   Title The patient will be independent with a basic HEP of stretches.    Baseline no HEP    Time 2    Period Weeks    Status Achieved    Target Date 07/11/21               PT Long Term Goals - 06/27/21 1421       PT LONG TERM GOAL #1   Title The patient will be able to perform right lumbar side bending without pain increase.    Baseline pain inrease 4/10    Time 6    Period Weeks    Status New    Target Date 07/11/21      PT LONG TERM GOAL #2   Title The patient will be able to sit for 45 minutes and then stand up  with back pain under or equal to 3/10    Baseline 5/10    Time 6    Period Weeks    Status New    Target Date 08/08/21      PT LONG TERM GOAL #3   Title The patient will walk x100-120 feet within normal distance of walker SBA for mobility to appointments.    Baseline forward flexed posture w/ walker at elbow height and arms extended forward x 80 feet.    Time 6    Period Weeks    Status New    Target Date 08/08/21                   Plan - 07/12/21 1145     Clinical Impression Statement Catalina has attended 4 sessions of physical therapy.  She does have improvement of side bending motion, but has pain aggravation with left side bending.  She still has tightness of the QL bilaterally and  rounded shoulder posture wiht incrase of thoracic kyphosis.  The patinet's resting tremor does incresae of upper extremity exercises.  Advanced sitting core stabilization to performance on the Airex.  The patient tolerated it well.  Continued to peform exercises in sitting as the patient does not tolerate supine position.  Recommend continued hterapy for reduction of low back pain with ADLs.    Personal Factors and Comorbidities Comorbidity 3+    Comorbidities DM Type II, Congestive heart failure, Ostepenia, history of CVA affecting R side    Examination-Activity Limitations Bed Mobility;Locomotion Level;Transfers;Carry;Lift;Stand;Stairs;Squat    Examination-Participation Restrictions Church;Meal Prep;Cleaning;Occupation;Shop;Laundry    PT Treatment/Interventions ADLs/Self Care Home Management;Cryotherapy;Moist Heat;Electrical Stimulation;Gait training;Stair training;Functional mobility training;Therapeutic activities;Therapeutic exercise;Balance training;Neuromuscular re-education;Manual techniques;Patient/family education;Taping;Dry needling    PT Next Visit Plan lumbar/hips stretches, core stabilization, ankle DF strengthening, Sitting core and upper back strengthening (does not tolerate  supine)     PT Home Exercise Plan Access Code: [email or web address]    Consulted and Agree with Plan of Care Patient;Family member/caregiver    Family Member Consulted Husband             Patient will benefit from skilled therapeutic intervention in order to improve the following deficits and impairments:  Abnormal gait, Decreased range of motion, Difficulty walking, Decreased activity tolerance, Pain, Improper body mechanics, Decreased mobility, Decreased strength, Postural dysfunction  Visit Diagnosis: Right low back pain, unspecified chronicity, unspecified whether sciatica present  Difficulty in walking, not elsewhere classified  Abnormal posture     Problem List Patient Active Problem List   Diagnosis Date Noted   Urinary frequency 12/06/2020   History of CVA (cerebrovascular accident) 04/26/2019   CHF (congestive heart failure), NYHA class III, chronic, diastolic (HCC) 11/11/2018   Hypoxemia requiring supplemental oxygen 11/10/2018   Fatigue 11/10/2018   Osteopenia of multiple sites 11/10/2018   Acute flank pain 08/07/2018   Umbilical pain 10/31/2017   Back pain 10/10/2016   Pelvic pain 10/10/2016   Seizures (HCC) 08/06/2014   Abnormal involuntary movement 08/05/2014   Hemiplegia, unspecified, affecting dominant side 08/05/2014   Right sided weakness 08/05/2014   CVA (cerebral vascular accident) (HCC) 07/21/2014   Left leg pain 07/20/2014   HTN (hypertension) 07/18/2014   HLD (hyperlipidemia) 07/18/2014   Thrombocytopenia (HCC) 07/18/2014   Essential tremor 10/27/2013   Obesity, unspecified 10/27/2013   DM2 (diabetes mellitus, type 2) (HCC) 10/27/2013   History of peripheral neuropathy 10/27/2013   Sharol Roussel, PT, DPT, OCS, Crt. DN Robet Leu 07/12/2021, 11:52 AM  [facility] [address] [place], [place], [address] Phone: [phone]   Fax:  [phone]  Name: ANGELLINA FERDINAND MRN: [id number] Date of Birth:  1930/01/21

## 2021-07-12 NOTE — Telephone Encounter (Signed)
Copy made for batch scanning and forms placed up front for pick.   Patient aware.

## 2021-07-19 ENCOUNTER — Other Ambulatory Visit: Payer: Self-pay

## 2021-07-19 ENCOUNTER — Ambulatory Visit: Payer: Medicare Other

## 2021-07-19 DIAGNOSIS — M545 Low back pain, unspecified: Secondary | ICD-10-CM | POA: Diagnosis not present

## 2021-07-19 DIAGNOSIS — R293 Abnormal posture: Secondary | ICD-10-CM

## 2021-07-19 DIAGNOSIS — R262 Difficulty in walking, not elsewhere classified: Secondary | ICD-10-CM

## 2021-07-19 NOTE — Therapy (Signed)
[facility] [address] [place], [place], [address] Phone: [phone]   Fax:  [phone]  Physical Therapy Treatment  Patient Details  Name: [name] [name] MRN: [id number] Date of Birth: 03/12/1930 Referring Provider (PT): Doreene Eland, MD   Encounter Date: 07/19/2021   PT End of Session - 07/19/21 1209     Visit Number 5    Number of Visits 12    Date for PT Re-Evaluation 08/07/21    Authorization Type MCR - FOTO on 6th and 10th visit    Progress Note Due on Visit 10    PT Start Time 1210    PT Stop Time 1253    PT Time Calculation (min) 43 min    Activity Tolerance Patient tolerated treatment well    Behavior During Therapy [facility] for tasks assessed/performed             Past Medical History:  Diagnosis Date   Anxiety    Diabetes (HCC) 10/27/2013   DVT (deep venous thrombosis) (HCC)    Essential tremor 10/27/2013   High cholesterol 1999   Hypertension    Ischemic stroke (HCC) 07/18/2014   Obesity, unspecified 10/27/2013   Pneumonia    Unspecified hereditary and idiopathic peripheral neuropathy 10/27/2013    Past Surgical History:  Procedure Laterality Date   CATARACT EXTRACTION Bilateral    FOOT SURGERY     IR KYPHO LUMBAR INC FX REDUCE BONE BX UNI/BIL CANNULATION INC/IMAGING  06/15/2019    There were no vitals filed for this visit.   Subjective Assessment - 07/19/21 1209     Subjective Pt presents to PT with continued reports lower [name] pain and L knee swelling. She has been doing her exercises at home per report. She is ready to begin PT at this time.    Patient is accompained by: Family member   husband who helps with interpreting   Currently in Pain? Yes    Pain Score 5     Pain Orientation Right           [facility] Adult PT Treatment/Exercise:   Therapeutic Exercise:  Seated ball squeeze 2x10 - 5 sec hold Seated clam 2x15 GTB Seated marches 2x20 GTB Fwd/lat ball rollout x 10 ea Rows 3x10  YTB LAQ 2x10 ea Seated hamstring stretch L x 30 sec Manual therapy: IASTM to bilat lumbar paraspinals and QL w/ pt in seated position                              PT Short Term Goals - 07/12/21 1149       PT SHORT TERM GOAL #1   Title The patient will be independent with a basic HEP of stretches.    Baseline no HEP    Time 2    Period Weeks    Status Achieved    Target Date 07/11/21               PT Long Term Goals - 06/27/21 1421       PT LONG TERM GOAL #1   Title The patient will be able to perform right lumbar side bending without pain increase.    Baseline pain inrease 4/10    Time 6    Period Weeks    Status New    Target Date 07/11/21      PT LONG TERM GOAL #2   Title The patient will be able to sit  for 45 minutes and then stand up with [name] pain under or equal to 3/10    Baseline 5/10    Time 6    Period Weeks    Status New    Target Date 08/08/21      PT LONG TERM GOAL #3   Title The patient will walk x100-120 feet within normal distance of walker SBA for mobility to appointments.    Baseline forward flexed posture w/ walker at elbow height and arms extended forward x 80 feet.    Time 6    Period Weeks    Status New    Target Date 08/08/21                   Plan - 07/19/21 1257     Clinical Impression Statement Pt was able to complete all prescribed exercises with no adverse effect. She seemed to improve and noted decrease in L knee pain after hamstring stretching and strengthening exercises at end of session. Pt also continues to resposne well to gentle IASTM to lower lumbar paraspinals. She continues to benefit form skilled PT services and will continue to be seen per POC.    Personal Factors and Comorbidities Comorbidity 3+    Comorbidities DM Type II, Congestive heart failure, Ostepenia, history of CVA affecting R side    Examination-Activity Limitations Bed Mobility;Locomotion  Level;Transfers;Carry;Lift;Stand;Stairs;Squat    Examination-Participation Restrictions Church;Meal Prep;Cleaning;Occupation;Shop;Laundry    PT Treatment/Interventions ADLs/Self Care Home Management;Cryotherapy;Moist Heat;Electrical Stimulation;Gait training;Stair training;Functional mobility training;Therapeutic activities;Therapeutic exercise;Balance training;Neuromuscular re-education;Manual techniques;Patient/family education;Taping;Dry needling    PT Next Visit Plan lumbar/hips stretches, core stabilization, ankle DF strengthening, Sitting core and upper [name] strengthening (does not tolerate supine)    PT Home Exercise Plan Access Code: [email or web address]    Consulted and Agree with Plan of Care Patient;Family member/caregiver    Family Member Consulted Husband             Patient will benefit from skilled therapeutic intervention in order to improve the following deficits and impairments:  Abnormal gait, Decreased range of motion, Difficulty walking, Decreased activity tolerance, Pain, Improper body mechanics, Decreased mobility, Decreased strength, Postural dysfunction  Visit Diagnosis: Right low [name] pain, unspecified chronicity, unspecified whether sciatica present  Difficulty in walking, not elsewhere classified  Abnormal posture     Problem List Patient Active Problem List   Diagnosis Date Noted   Urinary frequency 12/06/2020   History of CVA (cerebrovascular accident) 04/26/2019   CHF (congestive heart failure), NYHA class III, chronic, diastolic (HCC) 11/11/2018   Hypoxemia requiring supplemental oxygen 11/10/2018   Fatigue 11/10/2018   Osteopenia of multiple sites 11/10/2018   Acute flank pain 08/07/2018   Umbilical pain 10/31/2017   [name] pain 10/10/2016   Pelvic pain 10/10/2016   Seizures (HCC) 08/06/2014   Abnormal involuntary movement 08/05/2014   Hemiplegia, unspecified, affecting dominant side 08/05/2014   Right sided weakness 08/05/2014   CVA (cerebral vascular  accident) (HCC) 07/21/2014   Left leg pain 07/20/2014   HTN (hypertension) 07/18/2014   HLD (hyperlipidemia) 07/18/2014   Thrombocytopenia (HCC) 07/18/2014   Essential tremor 10/27/2013   Obesity, unspecified 10/27/2013   DM2 (diabetes mellitus, type 2) (HCC) 10/27/2013   History of peripheral neuropathy 10/27/2013    Eloy End, PT, DPT 07/19/21 1:39 PM  [facility] [place] [facility] [address] [place], [place], [address] Phone: [phone]   Fax:  [phone]  Name: [name] [name] MRN: [id number] Date of Birth: 06/29/1930

## 2021-07-24 ENCOUNTER — Ambulatory Visit: Payer: Medicare Other | Admitting: Physical Therapy

## 2021-07-24 ENCOUNTER — Encounter: Payer: Self-pay | Admitting: Physical Therapy

## 2021-07-24 ENCOUNTER — Other Ambulatory Visit: Payer: Self-pay

## 2021-07-24 DIAGNOSIS — M545 Low back pain, unspecified: Secondary | ICD-10-CM | POA: Diagnosis not present

## 2021-07-24 DIAGNOSIS — R262 Difficulty in walking, not elsewhere classified: Secondary | ICD-10-CM

## 2021-07-24 DIAGNOSIS — R293 Abnormal posture: Secondary | ICD-10-CM

## 2021-07-24 NOTE — Therapy (Signed)
[facility] [address] [place], [place], [address] Phone: [phone]   Fax:  [phone]  Physical Therapy Treatment  Patient Details  Name: [name] [name] MRN: [id number] Date of Birth: [birth year] Referring Provider (PT): Doreene Eland, MD   Encounter Date: 07/24/2021   PT End of Session - 07/24/21 1113     Visit Number 6    Number of Visits 12    Date for PT Re-Evaluation 08/07/21    Authorization Type MCR - FOTO on 6th and 10th visit- DC due to language    PT Start Time 1106    PT Stop Time 1150    PT Time Calculation (min) 44 min    Activity Tolerance Patient tolerated treatment well;Patient limited by pain    Behavior During Therapy [facility] for tasks assessed/performed             Past Medical History:  Diagnosis Date   Anxiety    Diabetes (HCC) 10/27/2013   DVT (deep venous thrombosis) (HCC)    Essential tremor 10/27/2013   High cholesterol 1999   Hypertension    Ischemic stroke (HCC) 07/18/2014   Obesity, unspecified 10/27/2013   Pneumonia    Unspecified hereditary and idiopathic peripheral neuropathy 10/27/2013    Past Surgical History:  Procedure Laterality Date   CATARACT EXTRACTION Bilateral    FOOT SURGERY     IR KYPHO LUMBAR INC FX REDUCE BONE BX UNI/BIL CANNULATION INC/IMAGING  06/15/2019    There were no vitals filed for this visit.   Subjective Assessment - 07/24/21 1110     Subjective Patient here with her son.  Pain with sit to stand and walking.  She walks far from her walker, needs safety cues.    Patient is accompained by: Family member    Limitations Sitting;Standing;Walking;House hold activities    Currently in Pain? Yes    Pain Score 5     Pain Location Knee    Pain Orientation Left    Pain Type Chronic pain    Pain Onset More than a month ago    Pain Frequency Intermittent    Aggravating Factors  walking, standing    Pain Score 9   with standing   Pain Location Back     Pain Orientation Right;Left;Lower    Pain Descriptors / Indicators Aching    Pain Type Chronic pain    Pain Onset More than a month ago    Pain Frequency Intermittent    Aggravating Factors  standing    Pain Relieving Factors resting             [facility] Adult PT Treatment/Exercise:  Therapeutic Exercise: - Seated red band horizontal abduction x 15 Seated row x 15 March (DC) Overhead arms with red looped band x 10 x 2 sets Standing wall slides for spine and hip extension x 10 with close S  Supine lower trunk rotation x 10 (pain)  Seated piriformis x 3 each side  Seated spine extension pushing into thighs x 10   Self-care/Home Management: - posture, HEP, safety with gait      PT Education - 07/24/21 1303     Education Details HEP and safety with walker, further appts    Person(s) Educated Patient    Methods Explanation;Demonstration;Verbal cues;Handout    Comprehension Verbalized understanding;Returned demonstration              PT Short Term Goals - 07/12/21 1149       PT SHORT  [facility] [address] [place], [place], [address] Phone: [phone]   Fax:  [phone]  Physical Therapy Treatment  Patient Details  Name: [name] [name] MRN: [id number] Date of Birth: [birth year] Referring Provider (PT): Doreene Eland, MD   Encounter Date: 07/24/2021   PT End of Session - 07/24/21 1113     Visit Number 6    Number of Visits 12    Date for PT Re-Evaluation 08/07/21    Authorization Type MCR - FOTO on 6th and 10th visit- DC due to language    PT Start Time 1106    PT Stop Time 1150    PT Time Calculation (min) 44 min    Activity Tolerance Patient tolerated treatment well;Patient limited by pain    Behavior During Therapy [facility] for tasks assessed/performed             Past Medical History:  Diagnosis Date   Anxiety    Diabetes (HCC) 10/27/2013   DVT (deep venous thrombosis) (HCC)    Essential tremor 10/27/2013   High cholesterol 1999   Hypertension    Ischemic stroke (HCC) 07/18/2014   Obesity, unspecified 10/27/2013   Pneumonia    Unspecified hereditary and idiopathic peripheral neuropathy 10/27/2013    Past Surgical History:  Procedure Laterality Date   CATARACT EXTRACTION Bilateral    FOOT SURGERY     IR KYPHO LUMBAR INC FX REDUCE BONE BX UNI/BIL CANNULATION INC/IMAGING  06/15/2019    There were no vitals filed for this visit.   Subjective Assessment - 07/24/21 1110     Subjective Patient here with her son.  Pain with sit to stand and walking.  She walks far from her walker, needs safety cues.    Patient is accompained by: Family member    Limitations Sitting;Standing;Walking;House hold activities    Currently in Pain? Yes    Pain Score 5     Pain Location Knee    Pain Orientation Left    Pain Type Chronic pain    Pain Onset More than a month ago    Pain Frequency Intermittent    Aggravating Factors  walking, standing    Pain Score 9   with standing   Pain Location Back     Pain Orientation Right;Left;Lower    Pain Descriptors / Indicators Aching    Pain Type Chronic pain    Pain Onset More than a month ago    Pain Frequency Intermittent    Aggravating Factors  standing    Pain Relieving Factors resting             [facility] Adult PT Treatment/Exercise:  Therapeutic Exercise: - Seated red band horizontal abduction x 15 Seated row x 15 March (DC) Overhead arms with red looped band x 10 x 2 sets Standing wall slides for spine and hip extension x 10 with close S  Supine lower trunk rotation x 10 (pain)  Seated piriformis x 3 each side  Seated spine extension pushing into thighs x 10   Self-care/Home Management: - posture, HEP, safety with gait      PT Education - 07/24/21 1303     Education Details HEP and safety with walker, further appts    Person(s) Educated Patient    Methods Explanation;Demonstration;Verbal cues;Handout    Comprehension Verbalized understanding;Returned demonstration              PT Short Term Goals - 07/12/21 1149       PT SHORT  07/18/2014   HLD (hyperlipidemia) 07/18/2014   Thrombocytopenia (HCC) 07/18/2014   Essential tremor 10/27/2013   Obesity, unspecified 10/27/2013   DM2 (diabetes mellitus, type 2) (HCC) 10/27/2013   History of peripheral neuropathy 10/27/2013    Ashton Belote 07/24/2021, 1:09 PM  [facility] [address] [place], [place], [address] Phone: [phone]   Fax:  [phone]  Name: [name] [name] MRN: [id number] Date of Birth: 11/03/1930  Karie Mainland, PT 07/24/21 1:12 PM Phone: [phone] Fax: [phone]

## 2021-07-27 ENCOUNTER — Ambulatory Visit: Payer: Medicare Other

## 2021-08-02 ENCOUNTER — Other Ambulatory Visit: Payer: Self-pay

## 2021-08-02 ENCOUNTER — Ambulatory Visit: Payer: Medicare Other | Attending: Family Medicine

## 2021-08-02 DIAGNOSIS — M545 Low back pain, unspecified: Secondary | ICD-10-CM | POA: Insufficient documentation

## 2021-08-02 DIAGNOSIS — R262 Difficulty in walking, not elsewhere classified: Secondary | ICD-10-CM | POA: Diagnosis present

## 2021-08-02 DIAGNOSIS — R293 Abnormal posture: Secondary | ICD-10-CM | POA: Diagnosis present

## 2021-08-02 NOTE — Therapy (Signed)
[facility] Outpatient Rehabilitation [facility] [address] [place], [place], [address] Phone: [phone]   Fax:  [phone]  Physical Therapy Treatment  Patient Details  Name: [name] [name] MRN: [id number] Date of Birth: 1930-08-21 Referring Provider (PT): Doreene Eland, MD   Encounter Date: 08/02/2021   PT End of Session - 08/02/21 1043     Visit Number 7    Number of Visits 12    Date for PT Re-Evaluation 08/07/21    Authorization Type MCR - FOTO on 6th and 10th visit- DC due to language    PT Start Time 1045    PT Stop Time 1128    PT Time Calculation (min) 43 min    Activity Tolerance Patient tolerated treatment well;Patient limited by pain    Behavior During Therapy [facility] for tasks assessed/performed             Past Medical History:  Diagnosis Date   Anxiety    Diabetes (HCC) 10/27/2013   DVT (deep venous thrombosis) (HCC)    Essential tremor 10/27/2013   High cholesterol 1999   Hypertension    Ischemic stroke (HCC) 07/18/2014   Obesity, unspecified 10/27/2013   Pneumonia    Unspecified hereditary and idiopathic peripheral neuropathy 10/27/2013    Past Surgical History:  Procedure Laterality Date   CATARACT EXTRACTION Bilateral    FOOT SURGERY     IR KYPHO LUMBAR INC FX REDUCE BONE BX UNI/BIL CANNULATION INC/IMAGING  06/15/2019    There were no vitals filed for this visit.   Subjective Assessment - 08/02/21 1043     Subjective Pt presents to PT with son and continued reports of pain.    Currently in Pain? Yes           [facility] Adult PT Treatment/Exercise:   Therapeutic Exercise: Seated red band horizontal abduction 2x10 Seated row 3x10 RTB Overhead arms with red looped band 10 x 2 sets Standing wall slides for spine and hip extension x 10 with close S (not today) Standing cane elevation against wall for  Seated piriformis stretch 3x20 sec each side  Seated spine extension pushing into thighs x 10   Manual  Therapy: Passive stretch to bilat hamstrings in sitting  IASTM to bilat thoracic and lumbar paraspinals via percussion device                               PT Short Term Goals - 07/12/21 1149       PT SHORT TERM GOAL #1   Title The patient will be independent with a basic HEP of stretches.    Baseline no HEP    Time 2    Period Weeks    Status Achieved    Target Date 07/11/21               PT Long Term Goals - 07/24/21 1122       PT LONG TERM GOAL #1   Title The patient will be able to perform right lumbar side bending without pain increase.    Status On-going      PT LONG TERM GOAL #2   Title The patient will be able to sit for 45 minutes and then stand up with back pain under or equal to 3/10    Baseline 8/10-9/10    Status On-going      PT LONG TERM GOAL #3   Title The patient will walk x100-120 feet  within normal distance of walker SBA for mobility to appointments.    Baseline forward flexed posture w/ walker at elbow height and arms extended forward x 80 feet.    Status On-going                   Plan - 08/02/21 1221     Clinical Impression Statement Pt was able to complete prescribed exercises with no adverse effect or increase in pain. Today we focused on increasing postural endurance and trunk extension. She responded well to manual therapy interventions, noting decreased pain at end of session. Pt continues to demo increased tightness in L posterior hip during session. PT will continue with current POC, will assess need for continued PT at next session.    PT Treatment/Interventions ADLs/Self Care Home Management;Cryotherapy;Moist Heat;Electrical Stimulation;Gait training;Stair training;Functional mobility training;Therapeutic activities;Therapeutic exercise;Balance training;Neuromuscular re-education;Manual techniques;Patient/family education;Taping;Dry needling    PT Next Visit Plan lumbar/hips stretches, core stabilization,  ankle DF strengthening, Sitting core and upper back strengthening (does not tolerate supine)    PT Home Exercise Plan Access Code: [email or web address]             Patient will benefit from skilled therapeutic intervention in order to improve the following deficits and impairments:  Abnormal gait, Decreased range of motion, Difficulty walking, Decreased activity tolerance, Pain, Improper body mechanics, Decreased mobility, Decreased strength, Postural dysfunction  Visit Diagnosis: Right low back pain, unspecified chronicity, unspecified whether sciatica present  Abnormal posture  Difficulty in walking, not elsewhere classified     Problem List Patient Active Problem List   Diagnosis Date Noted   Urinary frequency 12/06/2020   History of CVA (cerebrovascular accident) 04/26/2019   CHF (congestive heart failure), NYHA class III, chronic, diastolic (HCC) 11/11/2018   Hypoxemia requiring supplemental oxygen 11/10/2018   Fatigue 11/10/2018   Osteopenia of multiple sites 11/10/2018   Acute flank pain 08/07/2018   Umbilical pain 10/31/2017   Back pain 10/10/2016   Pelvic pain 10/10/2016   Seizures (HCC) 08/06/2014   Abnormal involuntary movement 08/05/2014   Hemiplegia, unspecified, affecting dominant side 08/05/2014   Right sided weakness 08/05/2014   CVA (cerebral vascular accident) (HCC) 07/21/2014   Left leg pain 07/20/2014   HTN (hypertension) 07/18/2014   HLD (hyperlipidemia) 07/18/2014   Thrombocytopenia (HCC) 07/18/2014   Essential tremor 10/27/2013   Obesity, unspecified 10/27/2013   DM2 (diabetes mellitus, type 2) (HCC) 10/27/2013   History of peripheral neuropathy 10/27/2013    Eloy End, PT 08/02/2021, 1:33 PM  [facility] Outpatient Rehabilitation [facility] [address] [place], [place], [address] Phone: [phone]   Fax:  [phone]  Name: [name] [name] MRN: [id number] Date of Birth: 12-15-1929

## 2021-08-05 ENCOUNTER — Other Ambulatory Visit: Payer: Self-pay | Admitting: Family Medicine

## 2021-08-05 ENCOUNTER — Other Ambulatory Visit: Payer: Self-pay | Admitting: Neurology

## 2021-08-05 DIAGNOSIS — G250 Essential tremor: Secondary | ICD-10-CM

## 2021-08-05 DIAGNOSIS — R531 Weakness: Secondary | ICD-10-CM

## 2021-08-05 DIAGNOSIS — E785 Hyperlipidemia, unspecified: Secondary | ICD-10-CM

## 2021-08-07 ENCOUNTER — Other Ambulatory Visit: Payer: Self-pay

## 2021-08-07 ENCOUNTER — Ambulatory Visit: Payer: Medicare Other

## 2021-08-07 DIAGNOSIS — R293 Abnormal posture: Secondary | ICD-10-CM

## 2021-08-07 DIAGNOSIS — R262 Difficulty in walking, not elsewhere classified: Secondary | ICD-10-CM

## 2021-08-07 DIAGNOSIS — M545 Low back pain, unspecified: Secondary | ICD-10-CM

## 2021-08-07 NOTE — Therapy (Addendum)
[facility] Outpatient Rehabilitation [facility] [address] [place], [place], [address] Phone: [phone]   Fax:  [phone]  Physical Therapy Treatment/Re-Cert  Patient Details  Name: [name] [name] MRN: [id number] Date of Birth: 01/06/1930 Referring Provider (PT): Doreene Eland, MD   Encounter Date: 08/07/2021   PT End of Session - 08/07/21 1213     Visit Number 8    Number of Visits 12    Date for PT Re-Evaluation 09/27/21    Authorization Type MCR - FOTO on 6th and 10th visit- DC due to language    PT Start Time 1215    PT Stop Time 1300    PT Time Calculation (min) 45 min    Activity Tolerance Patient tolerated treatment well;Patient limited by pain    Behavior During Therapy [facility] for tasks assessed/performed             Past Medical History:  Diagnosis Date   Anxiety    Diabetes (HCC) 10/27/2013   DVT (deep venous thrombosis) (HCC)    Essential tremor 10/27/2013   High cholesterol 1999   Hypertension    Ischemic stroke (HCC) 07/18/2014   Obesity, unspecified 10/27/2013   Pneumonia    Unspecified hereditary and idiopathic peripheral neuropathy 10/27/2013    Past Surgical History:  Procedure Laterality Date   CATARACT EXTRACTION Bilateral    FOOT SURGERY     IR KYPHO LUMBAR INC FX REDUCE BONE BX UNI/BIL CANNULATION INC/IMAGING  06/15/2019    There were no vitals filed for this visit.   Subjective Assessment - 08/07/21 1213     Subjective Pt presents to PT noting continued decrease in lower back pain. She continues compliance with HEP with no adverse effect. She is ready to begin PT at this time.    Currently in Pain? Yes    Pain Score 0-No pain   9/10 when standing and walking   Pain Location Back    Pain Orientation Lower           [facility] Adult PT Treatment/Exercise:   Therapeutic Exercise: Seated red band horizontal abduction 2x10 Seated row 3x10 green tband Overhead arms with red looped band 10 x 2 sets Standing wall  slides for spine and hip extension x 10 with close S (not today) Seated cane elevation 2lbs 2x10 Standing cane elevation against wall for  Seated piriformis stretch 3x20 sec each side  Seated spine extension pushing into thighs 2 x 10 LAQ 2x15 2lbs   Manual Therapy: IASTM to bilat thoracic and lumbar paraspinals via percussion device     [facility] PT Assessment - 08/07/21 0001       Transfers   Five time sit to stand comments  26 sec w/ UE support on FWW                                    PT Education - 08/07/21 1401     Education Details HEP and POC    Person(s) Educated Patient;Caregiver(s)    Methods Explanation;Demonstration;Handout    Comprehension Verbalized understanding;Returned demonstration              PT Short Term Goals - 07/12/21 1149       PT SHORT TERM GOAL #1   Title The patient will be independent with a basic HEP of stretches.    Baseline no HEP    Time 2    Period Weeks  Status Achieved    Target Date 07/11/21               PT Long Term Goals - 08/07/21 1347       PT LONG TERM GOAL #1   Title Pt will decrease 5xSTS time to no greater than 20 sec with decrease UE assistance in order to improve balance and mobility    Baseline 26 sec w/ bilat UE assist    Time 7    Period Weeks    Status New    Target Date 09/27/21      PT LONG TERM GOAL #2   Title The patient will be able to sit for 45 minutes and then stand up with back pain under or equal to 3/10    Baseline 8/10-9/10 - 08/07/21 (no change)7    Time 7    Period Weeks    Status On-going      PT LONG TERM GOAL #3   Title The patient will walk x100-120 feet within normal distance of walker SBA for mobility to appointments.    Baseline forward flexed posture w/ walker at elbow height and arms extended forward x 80 feet.    Time 7    Period Weeks    Status On-going    Target Date 09/25/21                   Plan - 08/07/21 1350     Clinical  Impression Statement Pt was again able to complete prescribed exercises with no adverse effect. Pt continues to benefit from skilled PT, noting no LBP in sitting postions and improvement in posture while sitting, however, she continues to have pain with standing and ambulation. She would continue to benefit from skilled PT, working on improving endurance of postural muscles, trunk extensor strength, and LE strength in order to improve comfort and mobility. Will continue to progress exercises as tolerated per POC.    Personal Factors and Comorbidities Comorbidity 3+    Comorbidities DM Type II, Congestive heart failure, Ostepenia, history of CVA affecting R side    Examination-Activity Limitations Bed Mobility;Locomotion Level;Transfers;Carry;Lift;Stand;Stairs;Squat    Examination-Participation Restrictions Church;Meal Prep;Cleaning;Occupation;Shop;Laundry    PT Frequency 1x / week    PT Duration Other (comment)   7 weeks   PT Treatment/Interventions ADLs/Self Care Home Management;Cryotherapy;Moist Heat;Electrical Stimulation;Gait training;Stair training;Functional mobility training;Therapeutic activities;Therapeutic exercise;Balance training;Neuromuscular re-education;Manual techniques;Patient/family education;Taping;Dry needling    PT Next Visit Plan lumbar/hips stretches, core stabilization, ankle DF strengthening, Sitting core and upper back strengthening (does not tolerate supine)    PT Home Exercise Plan Access Code: [email or web address]    Consulted and Agree with Plan of Care Patient;Family member/caregiver    Family Member Consulted Son             Patient will benefit from skilled therapeutic intervention in order to improve the following deficits and impairments:  Abnormal gait, Decreased range of motion, Difficulty walking, Decreased activity tolerance, Pain, Improper body mechanics, Decreased mobility, Decreased strength, Postural dysfunction  Visit Diagnosis: Right low back pain, unspecified  chronicity, unspecified whether sciatica present - Plan: PT plan of care cert/re-cert  Abnormal posture - Plan: PT plan of care cert/re-cert  Difficulty in walking, not elsewhere classified - Plan: PT plan of care cert/re-cert     Problem List Patient Active Problem List   Diagnosis Date Noted   Urinary frequency 12/06/2020   History of CVA (cerebrovascular accident) 04/26/2019   CHF (congestive heart failure), NYHA class III, chronic, diastolic (  HCC) 11/11/2018   Hypoxemia requiring supplemental oxygen 11/10/2018   Fatigue 11/10/2018   Osteopenia of multiple sites 11/10/2018   Acute flank pain 08/07/2018   Umbilical pain 10/31/2017   Back pain 10/10/2016   Pelvic pain 10/10/2016   Seizures (HCC) 08/06/2014   Abnormal involuntary movement 08/05/2014   Hemiplegia, unspecified, affecting dominant side 08/05/2014   Right sided weakness 08/05/2014   CVA (cerebral vascular accident) (HCC) 07/21/2014   Left leg pain 07/20/2014   HTN (hypertension) 07/18/2014   HLD (hyperlipidemia) 07/18/2014   Thrombocytopenia (HCC) 07/18/2014   Essential tremor 10/27/2013   Obesity, unspecified 10/27/2013   DM2 (diabetes mellitus, type 2) (HCC) 10/27/2013   History of peripheral neuropathy 10/27/2013    Eloy End, PT 08/07/2021, 2:02 PM  [facility] Outpatient Rehabilitation [facility] [address] [place], [place], [address] Phone: [phone]   Fax:  [phone]  Name: [name] [name] MRN: [id number] Date of Birth: 1930/11/01

## 2021-08-08 ENCOUNTER — Other Ambulatory Visit: Payer: Self-pay | Admitting: *Deleted

## 2021-08-08 DIAGNOSIS — G250 Essential tremor: Secondary | ICD-10-CM

## 2021-08-08 MED ORDER — PRIMIDONE 250 MG PO TABS: 250.0000 mg | ORAL_TABLET | Freq: Two times a day (BID) | ORAL | 1 refills | Status: DC

## 2021-08-21 ENCOUNTER — Other Ambulatory Visit: Payer: Self-pay

## 2021-08-21 ENCOUNTER — Ambulatory Visit: Payer: Medicare Other

## 2021-08-21 DIAGNOSIS — M545 Low back pain, unspecified: Secondary | ICD-10-CM | POA: Diagnosis not present

## 2021-08-21 DIAGNOSIS — R293 Abnormal posture: Secondary | ICD-10-CM

## 2021-08-21 DIAGNOSIS — R262 Difficulty in walking, not elsewhere classified: Secondary | ICD-10-CM

## 2021-08-21 NOTE — Therapy (Signed)
[facility] Outpatient Rehabilitation [facility] [address] [place], [place], [address] Phone: [phone]   Fax:  [phone]  Physical Therapy Treatment  Patient Details  Name: [name] [name] MRN: [id number] Date of Birth: 05-03-1930 Referring Provider (PT): Doreene Eland, MD   Encounter Date: 08/21/2021   PT End of Session - 08/21/21 1212     Visit Number 9    Number of Visits 15    Date for PT Re-Evaluation 09/27/21    Authorization Type MCR - FOTO on 6th and 10th visit- DC due to language    PT Start Time 1215    PT Stop Time 1255    PT Time Calculation (min) 40 min    Activity Tolerance Patient tolerated treatment well;Patient limited by pain    Behavior During Therapy [facility] for tasks assessed/performed             Past Medical History:  Diagnosis Date   Anxiety    Diabetes (HCC) 10/27/2013   DVT (deep venous thrombosis) (HCC)    Essential tremor 10/27/2013   High cholesterol 1999   Hypertension    Ischemic stroke (HCC) 07/18/2014   Obesity, unspecified 10/27/2013   Pneumonia    Unspecified hereditary and idiopathic peripheral neuropathy 10/27/2013    Past Surgical History:  Procedure Laterality Date   CATARACT EXTRACTION Bilateral    FOOT SURGERY     IR KYPHO LUMBAR INC FX REDUCE BONE BX UNI/BIL CANNULATION INC/IMAGING  06/15/2019    There were no vitals filed for this visit.   Subjective Assessment - 08/21/21 1212     Subjective Pt presents to PT with reports of continued but decreasing back pain. Continues compliance with HEP. Ready to begin PT at this time.    Currently in Pain? Yes    Pain Score 5     Pain Location Back    Pain Orientation Lower           [facility] Adult PT Treatment/Exercise:   Therapeutic Exercise: STS with overhead cane elevation 2lbs 2x10 Seated green tband horizontal abduction 2x10 Standing row 3x12 green tband Overhead arms with red looped band 10 x 3 sets Standing wall slides for spine and hip  extension x 10 with close S (not today) Seated cane elevation 2lbs 2x10 Seated piriformis stretch x60 sec each side  Seated spine extension pushing into thighs x 20 LAQ 2x15 2lbs Seated hamstring curl 2x10 green tband   Manual Therapy: IASTM to bilat thoracic and lumbar paraspinals via percussion device                               PT Short Term Goals - 07/12/21 1149       PT SHORT TERM GOAL #1   Title The patient will be independent with a basic HEP of stretches.    Baseline no HEP    Time 2    Period Weeks    Status Achieved    Target Date 07/11/21               PT Long Term Goals - 08/07/21 1347       PT LONG TERM GOAL #1   Title Pt will decrease 5xSTS time to no greater than 20 sec with decrease UE assistance in order to improve balance and mobility    Baseline 26 sec w/ bilat UE assist    Time 7    Period Weeks  Status New    Target Date 09/27/21      PT LONG TERM GOAL #2   Title The patient will be able to sit for 45 minutes and then stand up with back pain under or equal to 3/10    Baseline 8/10-9/10 - 08/07/21 (no change)7    Time 7    Period Weeks    Status On-going      PT LONG TERM GOAL #3   Title The patient will walk x100-120 feet within normal distance of walker SBA for mobility to appointments.    Baseline forward flexed posture w/ walker at elbow height and arms extended forward x 80 feet.    Time 7    Period Weeks    Status On-going    Target Date 09/25/21                   Plan - 08/21/21 1228     Clinical Impression Statement Pt was able to complete prescribed execises and progressions today with no change in baseline pain. Today's session tried to progress what pt was able to do in standing, which we will keep progressing as tolerated. Also continues to benefit overall from skilled PT working on improving strength and postural endurance. Will continue to progress as able per POC.    PT  Treatment/Interventions ADLs/Self Care Home Management;Cryotherapy;Moist Heat;Electrical Stimulation;Gait training;Stair training;Functional mobility training;Therapeutic activities;Therapeutic exercise;Balance training;Neuromuscular re-education;Manual techniques;Patient/family education;Taping;Dry needling    PT Next Visit Plan lumbar/hips stretches, core stabilization, ankle DF strengthening, Sitting core and upper back strengthening (does not tolerate supine)    PT Home Exercise Plan Access Code: [email or web address]             Patient will benefit from skilled therapeutic intervention in order to improve the following deficits and impairments:  Abnormal gait, Decreased range of motion, Difficulty walking, Decreased activity tolerance, Pain, Improper body mechanics, Decreased mobility, Decreased strength, Postural dysfunction  Visit Diagnosis: Right low back pain, unspecified chronicity, unspecified whether sciatica present  Abnormal posture  Difficulty in walking, not elsewhere classified     Problem List Patient Active Problem List   Diagnosis Date Noted   Urinary frequency 12/06/2020   History of CVA (cerebrovascular accident) 04/26/2019   CHF (congestive heart failure), NYHA class III, chronic, diastolic (HCC) 11/11/2018   Hypoxemia requiring supplemental oxygen 11/10/2018   Fatigue 11/10/2018   Osteopenia of multiple sites 11/10/2018   Acute flank pain 08/07/2018   Umbilical pain 10/31/2017   Back pain 10/10/2016   Pelvic pain 10/10/2016   Seizures (HCC) 08/06/2014   Abnormal involuntary movement 08/05/2014   Hemiplegia, unspecified, affecting dominant side 08/05/2014   Right sided weakness 08/05/2014   CVA (cerebral vascular accident) (HCC) 07/21/2014   Left leg pain 07/20/2014   HTN (hypertension) 07/18/2014   HLD (hyperlipidemia) 07/18/2014   Thrombocytopenia (HCC) 07/18/2014   Essential tremor 10/27/2013   Obesity, unspecified 10/27/2013   DM2 (diabetes mellitus, type  2) (HCC) 10/27/2013   History of peripheral neuropathy 10/27/2013    Eloy End, PT 08/21/2021, 1:00 PM  [facility] Outpatient Rehabilitation [facility] [address] [place], [place], [address] Phone: [phone]   Fax:  [phone]  Name: SWETA HALSETH MRN: [id number] Date of Birth: [birth year]

## 2021-08-28 ENCOUNTER — Other Ambulatory Visit: Payer: Self-pay

## 2021-08-28 ENCOUNTER — Ambulatory Visit: Payer: Medicare Other | Attending: Family Medicine

## 2021-08-28 DIAGNOSIS — R262 Difficulty in walking, not elsewhere classified: Secondary | ICD-10-CM | POA: Insufficient documentation

## 2021-08-28 DIAGNOSIS — R293 Abnormal posture: Secondary | ICD-10-CM | POA: Diagnosis present

## 2021-08-28 DIAGNOSIS — M545 Low back pain, unspecified: Secondary | ICD-10-CM | POA: Diagnosis not present

## 2021-08-28 NOTE — Therapy (Signed)
[facility] [address] [place], [place], [address] Phone: [phone]   Fax:  [phone]  Physical Therapy Treatment  Patient Details  Name: [name] [name] MRN: [id number] Date of Birth: December 25, 1929 Referring Provider (PT): Doreene Eland, MD   Encounter Date: 08/28/2021   PT End of Session - 08/28/21 1213     Visit Number 10    Number of Visits 15    Date for PT Re-Evaluation 09/27/21    Authorization Type MCR - FOTO on 6th and 10th visit- DC due to language    Progress Note Due on Visit 18   progress note/recert done at visit 8 on 9/13   PT Start Time 1215    PT Stop Time 1258    PT Time Calculation (min) 43 min    Activity Tolerance Patient tolerated treatment well;Patient limited by pain    Behavior During Therapy [facility] for tasks assessed/performed             Past Medical History:  Diagnosis Date   Anxiety    Diabetes (HCC) 10/27/2013   DVT (deep venous thrombosis) (HCC)    Essential tremor 10/27/2013   High cholesterol 1999   Hypertension    Ischemic stroke (HCC) 07/18/2014   Obesity, unspecified 10/27/2013   Pneumonia    Unspecified hereditary and idiopathic peripheral neuropathy 10/27/2013    Past Surgical History:  Procedure Laterality Date   CATARACT EXTRACTION Bilateral    FOOT SURGERY     IR KYPHO LUMBAR INC FX REDUCE BONE BX UNI/BIL CANNULATION INC/IMAGING  06/15/2019    There were no vitals filed for this visit.   Subjective Assessment - 08/28/21 1214     Subjective Pt presents to PT with reports of continued but decreasing back pain. Continues compliance with HEP. Ready to begin PT at this time.    Currently in Pain? Yes    Pain Score 5     Pain Location Back    Pain Orientation Lower           [facility] Adult PT Treatment/Exercise:   Therapeutic Exercise: STS with overhead cane elevation 2lbs 2x10 Seated green tband horizontal abduction 2x10 Seated row 3x10 green tband Standing row  3x12 green tband Overhead arms with red looped band 10 x 3 sets Standing wall slides for spine and hip extension x 10 with close S (NOT TODAY) Seated cane elevation 2lbs 2x10 Seated piriformis stretch x60 sec each side  Seated spine extension pushing into thighs x 20 LAQ 2x15 2lbs Seated hamstring curl 2x10 green tband Standing hip abd/ext w/ UE support x 10 ea   Manual Therapy: IASTM to bilat thoracic and lumbar paraspinals via percussion device                               PT Short Term Goals - 07/12/21 1149       PT SHORT TERM GOAL #1   Title The patient will be independent with a basic HEP of stretches.    Baseline no HEP    Time 2    Period Weeks    Status Achieved    Target Date 07/11/21               PT Long Term Goals - 08/07/21 1347       PT LONG TERM GOAL #1   Title Pt will decrease 5xSTS time to no greater than 20 sec with decrease  UE assistance in order to improve balance and mobility    Baseline 26 sec w/ bilat UE assist    Time 7    Period Weeks    Status New    Target Date 09/27/21      PT LONG TERM GOAL #2   Title The patient will be able to sit for 45 minutes and then stand up with back pain under or equal to 3/10    Baseline 8/10-9/10 - 08/07/21 (no change)7    Time 7    Period Weeks    Status On-going      PT LONG TERM GOAL #3   Title The patient will walk x100-120 feet within normal distance of walker SBA for mobility to appointments.    Baseline forward flexed posture w/ walker at elbow height and arms extended forward x 80 feet.    Time 7    Period Weeks    Status On-going    Target Date 09/25/21                   Plan - 08/28/21 1345     Clinical Impression Statement Pt was able to complete prescribed exercises with tolerance to standing progressions. Today we continued to work on proximal hip strength and postural re-education in standing to decrease pain and improve function. Pt continues to  benefit from skilled PT services working to improve comfort and functional ability. Will continue to progress as tolerated per POC.    PT Treatment/Interventions ADLs/Self Care Home Management;Cryotherapy;Moist Heat;Electrical Stimulation;Gait training;Stair training;Functional mobility training;Therapeutic activities;Therapeutic exercise;Balance training;Neuromuscular re-education;Manual techniques;Patient/family education;Taping;Dry needling    PT Next Visit Plan lumbar/hips stretches, core stabilization, ankle DF strengthening, Sitting core and upper back strengthening (does not tolerate supine)    PT Home Exercise Plan Access Code: [email or web address]             Patient will benefit from skilled therapeutic intervention in order to improve the following deficits and impairments:  Abnormal gait, Decreased range of motion, Difficulty walking, Decreased activity tolerance, Pain, Improper body mechanics, Decreased mobility, Decreased strength, Postural dysfunction  Visit Diagnosis: Right low back pain, unspecified chronicity, unspecified whether sciatica present  Abnormal posture  Difficulty in walking, not elsewhere classified     Problem List Patient Active Problem List   Diagnosis Date Noted   Urinary frequency 12/06/2020   History of CVA (cerebrovascular accident) 04/26/2019   CHF (congestive heart failure), NYHA class III, chronic, diastolic (HCC) 11/11/2018   Hypoxemia requiring supplemental oxygen 11/10/2018   Fatigue 11/10/2018   Osteopenia of multiple sites 11/10/2018   Acute flank pain 08/07/2018   Umbilical pain 10/31/2017   Back pain 10/10/2016   Pelvic pain 10/10/2016   Seizures (HCC) 08/06/2014   Abnormal involuntary movement 08/05/2014   Hemiplegia, unspecified, affecting dominant side 08/05/2014   Right sided weakness 08/05/2014   CVA (cerebral vascular accident) (HCC) 07/21/2014   Left leg pain 07/20/2014   HTN (hypertension) 07/18/2014   HLD (hyperlipidemia)  07/18/2014   Thrombocytopenia (HCC) 07/18/2014   Essential tremor 10/27/2013   Obesity, unspecified 10/27/2013   DM2 (diabetes mellitus, type 2) (HCC) 10/27/2013   History of peripheral neuropathy 10/27/2013    Eloy End, PT 08/28/2021, 1:48 PM  [facility] [address] [place], [place], [address] Phone: [phone]   Fax:  [phone]  Name: [name] [name] MRN: [id number] Date of Birth: 01/22/1930

## 2021-09-04 ENCOUNTER — Encounter: Payer: Self-pay | Admitting: Physical Therapy

## 2021-09-04 ENCOUNTER — Other Ambulatory Visit: Payer: Self-pay

## 2021-09-04 ENCOUNTER — Ambulatory Visit: Payer: Medicare Other | Admitting: Physical Therapy

## 2021-09-04 DIAGNOSIS — M545 Low back pain, unspecified: Secondary | ICD-10-CM

## 2021-09-04 DIAGNOSIS — R262 Difficulty in walking, not elsewhere classified: Secondary | ICD-10-CM

## 2021-09-04 DIAGNOSIS — R293 Abnormal posture: Secondary | ICD-10-CM

## 2021-09-04 NOTE — Therapy (Signed)
[facility] [address] [place], [place], [address] Phone: [phone]   Fax:  [phone]  Physical Therapy Treatment  Patient Details  Name: [name] [name] MRN: [id number] Date of Birth: Mar 17, 1930 Referring Provider (PT): Doreene Eland, MD   Encounter Date: 09/04/2021   PT End of Session - 09/04/21 1157     Visit Number 11    Number of Visits 15    Date for PT Re-Evaluation 09/27/21    Authorization Type MCR - FOTO on 6th and 10th visit- DC due to language    PT Start Time 1148    PT Stop Time 1230    PT Time Calculation (min) 42 min    Activity Tolerance Patient tolerated treatment well;Patient limited by pain    Behavior During Therapy [facility] for tasks assessed/performed             Past Medical History:  Diagnosis Date   Anxiety    Diabetes (HCC) 10/27/2013   DVT (deep venous thrombosis) (HCC)    Essential tremor 10/27/2013   High cholesterol 1999   Hypertension    Ischemic stroke (HCC) 07/18/2014   Obesity, unspecified 10/27/2013   Pneumonia    Unspecified hereditary and idiopathic peripheral neuropathy 10/27/2013    Past Surgical History:  Procedure Laterality Date   CATARACT EXTRACTION Bilateral    FOOT SURGERY     IR KYPHO LUMBAR INC FX REDUCE BONE BX UNI/BIL CANNULATION INC/IMAGING  06/15/2019    There were no vitals filed for this visit.   Subjective Assessment - 09/04/21 1155     Subjective Patient has pain only with standing.  It is 9/10 with standing any length of time.    How long can you stand comfortably? 5-10 min    How long can you walk comfortably? 5-10 minutes    Currently in Pain? Yes    Pain Score 9    with standing   Pain Location Back    Pain Orientation Left;Lower    Pain Descriptors / Indicators Aching    Pain Type Chronic pain    Pain Radiating Towards no longer radiates    Pain Onset More than a month ago    Pain Frequency Intermittent    Aggravating Factors  walking,  standing    Pain Relieving Factors sitting                   Therapeutic Exercise: STS 3 x 5 with and without UE to walker  Wall l"sit"  and overhead lift 2 x 5 and horizontal abduction for chest opening, spine extension  Seated green tband horizontal abduction 2x10 Seated row 3x10 green tband Bicep curl x 15 green tband  Supine with bolster Lower trunk rotation x 10  Knee to chest with opp leg extend Mini bridge x 10  Hamstring stretching sheet 2 x 30 sec, very limited    Mod A for supine to sit     PT Short Term Goals - 07/12/21 1149       PT SHORT TERM GOAL #1   Title The patient will be independent with a basic HEP of stretches.    Baseline no HEP    Time 2    Period Weeks    Status Achieved    Target Date 07/11/21               PT Long Term Goals - 09/04/21 1157       PT LONG TERM GOAL #1  Title Pt will decrease 5xSTS time to no greater than 20 sec with decrease UE assistance in order to improve balance and mobility    Baseline can do > 20 sec without UEs x 5, 15 sec with UEs.    Status Achieved      PT LONG TERM GOAL #2   Title The patient will be able to sit for 45 minutes and then stand up with back pain under or equal to 3/10    Baseline no change    Status On-going      PT LONG TERM GOAL #3   Title The patient will walk x100-120 feet within normal distance of walker SBA for mobility to appointments.    Status On-going                   Plan - 09/04/21 1223     Clinical Impression Statement Pt with coninued severe 9/10 pain in back with any amount of standing.  She works hard, often needs cues to slow pace and maintain tension on bands. Able to tolerate hooklying with bolster, 2 pillows and towel to access post hips and stretch. Pt is not progressing with pain but no longer has pain in L knee. She will benefit from a few more visits as it does seem to be improving her overall quality of life.    PT Treatment/Interventions  ADLs/Self Care Home Management;Cryotherapy;Moist Heat;Electrical Stimulation;Gait training;Stair training;Functional mobility training;Therapeutic activities;Therapeutic exercise;Balance training;Neuromuscular re-education;Manual techniques;Patient/family education;Taping;Dry needling    PT Next Visit Plan posture, measure gait distance UE strength.  Sitting core, stand as tol.  Did Ok with wedge for supine    PT Home Exercise Plan Access Code: [email or web address]    Consulted and Agree with Plan of Care Patient;Family member/caregiver    Family Member Consulted Son             Patient will benefit from skilled therapeutic intervention in order to improve the following deficits and impairments:  Abnormal gait, Decreased range of motion, Difficulty walking, Decreased activity tolerance, Pain, Improper body mechanics, Decreased mobility, Decreased strength, Postural dysfunction  Visit Diagnosis: Right low back pain, unspecified chronicity, unspecified whether sciatica present  Abnormal posture  Difficulty in walking, not elsewhere classified     Problem List Patient Active Problem List   Diagnosis Date Noted   Urinary frequency 12/06/2020   History of CVA (cerebrovascular accident) 04/26/2019   CHF (congestive heart failure), NYHA class III, chronic, diastolic (HCC) 11/11/2018   Hypoxemia requiring supplemental oxygen 11/10/2018   Fatigue 11/10/2018   Osteopenia of multiple sites 11/10/2018   Acute flank pain 08/07/2018   Umbilical pain 10/31/2017   Back pain 10/10/2016   Pelvic pain 10/10/2016   Seizures (HCC) 08/06/2014   Abnormal involuntary movement 08/05/2014   Hemiplegia, unspecified, affecting dominant side 08/05/2014   Right sided weakness 08/05/2014   CVA (cerebral vascular accident) (HCC) 07/21/2014   Left leg pain 07/20/2014   HTN (hypertension) 07/18/2014   HLD (hyperlipidemia) 07/18/2014   Thrombocytopenia (HCC) 07/18/2014   Essential tremor 10/27/2013   Obesity,  unspecified 10/27/2013   DM2 (diabetes mellitus, type 2) (HCC) 10/27/2013   History of peripheral neuropathy 10/27/2013    Jessica Seidman, PT 09/04/2021, 12:36 PM  [facility] [address] [place], [place], [address] Phone: [phone]   Fax:  [phone]  Name: [name] [name] MRN: [id number] Date of Birth: March 10, 1930   Karie Mainland, PT 09/04/21 12:36 PM Phone: [phone] Fax: [phone]

## 2021-09-11 ENCOUNTER — Ambulatory Visit: Payer: Medicare Other

## 2021-09-18 ENCOUNTER — Other Ambulatory Visit: Payer: Self-pay

## 2021-09-18 ENCOUNTER — Ambulatory Visit: Payer: Medicare Other

## 2021-09-18 DIAGNOSIS — R262 Difficulty in walking, not elsewhere classified: Secondary | ICD-10-CM

## 2021-09-18 DIAGNOSIS — R293 Abnormal posture: Secondary | ICD-10-CM

## 2021-09-18 DIAGNOSIS — M545 Low back pain, unspecified: Secondary | ICD-10-CM

## 2021-09-18 NOTE — Therapy (Signed)
[facility] [address] [place], [place], [address] Phone: [phone]   Fax:  [phone]  Physical Therapy Treatment  Patient Details  Name: [name] [name] MRN: [id number] Date of Birth: [birth year] Referring Provider (PT): Doreene Eland, MD   Encounter Date: 09/18/2021   PT [name] of Session - 09/18/21 1214     Visit Number 12    Number of Visits 15    Date for PT Re-Evaluation 09/27/21    Authorization Type MCR - FOTO on 6th and 10th visit- DC due to language    PT Start Time 1215    PT Stop Time 1255    PT Time Calculation (min) 40 min    Activity Tolerance Patient tolerated treatment well;Patient limited by pain    Behavior During Therapy [facility] for tasks assessed/performed             Past Medical History:  Diagnosis Date   Anxiety    Diabetes (HCC) 10/27/2013   DVT (deep venous thrombosis) (HCC)    Essential tremor 10/27/2013   High cholesterol 1999   Hypertension    Ischemic stroke (HCC) 07/18/2014   Obesity, unspecified 10/27/2013   Pneumonia    Unspecified hereditary and idiopathic peripheral neuropathy 10/27/2013    Past Surgical History:  Procedure Laterality Date   CATARACT EXTRACTION Bilateral    FOOT SURGERY     IR KYPHO LUMBAR INC FX REDUCE BONE BX UNI/BIL CANNULATION INC/IMAGING  06/15/2019    There were no vitals filed for this visit.   Subjective Assessment - 09/18/21 1215     Subjective Patient has pain only with standing.  It is 9/10 with standing any length of time.    Currently in Pain? Yes    Pain Score 9     Pain Location Back    Pain Orientation Left;Lower           Therapeutic Exercise: STS 3 x 5 with and without UE to walker  Seated green tband horizontal abduction 3x10 Standing row 3x10 green tband Bicep curl 3x10 3lb  Seated clamshell 3x15 Seated march 2x20 green tband Seated hamstring curl 3x10 green tband LAQ 3lbs 2x15 ea Supine with bolster SLR 2x5 ea Lower  trunk rotation x 10  Knee to chest with opp leg extend Mini bridge x 10   Past Interventions Not Performed Today:  Wall l"sit"  and overhead lift 2 x 5 and horizontal abduction for chest opening, spine extension                               PT Short Term Goals - 07/12/21 1149       PT SHORT TERM GOAL #1   Title The patient will be independent with a basic HEP of stretches.    Baseline no HEP    Time 2    Period Weeks    Status Achieved    Target Date 07/11/21               PT Long Term Goals - 09/04/21 1157       PT LONG TERM GOAL #1   Title Pt will decrease 5xSTS time to no greater than 20 sec with decrease UE assistance in order to improve balance and mobility    Baseline can do > 20 sec without UEs x 5, 15 sec with UEs.    Status Achieved      PT LONG TERM  GOAL #2   Title The patient will be able to sit for 45 minutes and then stand up with back pain under or equal to 3/10    Baseline no change    Status On-going      PT LONG TERM GOAL #3   Title The patient will walk x100-120 feet within normal distance of walker SBA for mobility to appointments.    Status On-going                   Plan - 09/18/21 1340     Clinical Impression Statement Pt was able to complete all prescribed exercises with no adverse effect or increase in pain. She showed improved functional activity tolerance today and appears to be improving in strength and mobility. Today's session continued to focus on overall strengthening, with particular emphasis on glutes and periscapular area. She continues to benefit from skilled PT services and will continue to be seen and progressed as tolerated.    PT Treatment/Interventions ADLs/Self Care Home Management;Cryotherapy;Moist Heat;Electrical Stimulation;Gait training;Stair training;Functional mobility training;Therapeutic activities;Therapeutic exercise;Balance training;Neuromuscular re-education;Manual  techniques;Patient/family education;Taping;Dry needling    PT Next Visit Plan posture, measure gait distance UE strength.  Sitting core, stand as tol.  Did Ok with wedge for supine    PT Home Exercise Plan Access Code: [email or web address]             Patient will benefit from skilled therapeutic intervention in order to improve the following deficits and impairments:  Abnormal gait, Decreased range of motion, Difficulty walking, Decreased activity tolerance, Pain, Improper body mechanics, Decreased mobility, Decreased strength, Postural dysfunction  Visit Diagnosis: Right low back pain, unspecified chronicity, unspecified whether sciatica present  Abnormal posture  Difficulty in walking, not elsewhere classified     Problem List Patient Active Problem List   Diagnosis Date Noted   Urinary frequency 12/06/2020   History of CVA (cerebrovascular accident) 04/26/2019   CHF (congestive heart failure), NYHA class III, chronic, diastolic (HCC) 11/11/2018   Hypoxemia requiring supplemental oxygen 11/10/2018   Fatigue 11/10/2018   Osteopenia of multiple sites 11/10/2018   Acute flank pain 08/07/2018   Umbilical pain 10/31/2017   Back pain 10/10/2016   Pelvic pain 10/10/2016   Seizures (HCC) 08/06/2014   Abnormal involuntary movement 08/05/2014   Hemiplegia, unspecified, affecting dominant side 08/05/2014   Right sided weakness 08/05/2014   CVA (cerebral vascular accident) (HCC) 07/21/2014   Left leg pain 07/20/2014   HTN (hypertension) 07/18/2014   HLD (hyperlipidemia) 07/18/2014   Thrombocytopenia (HCC) 07/18/2014   Essential tremor 10/27/2013   Obesity, unspecified 10/27/2013   DM2 (diabetes mellitus, type 2) (HCC) 10/27/2013   History of peripheral neuropathy 10/27/2013    [name] [name], PT 09/18/2021, 1:48 PM  [facility] [address] [place], [place], [address] Phone: [phone]   Fax:  [phone]  Name: [name] [name] MRN: [id number] Date of Birth: 14-Mar-1930

## 2021-09-22 ENCOUNTER — Other Ambulatory Visit: Payer: Self-pay | Admitting: Neurology

## 2021-09-22 DIAGNOSIS — E0842 Diabetes mellitus due to underlying condition with diabetic polyneuropathy: Secondary | ICD-10-CM

## 2021-09-25 ENCOUNTER — Telehealth: Payer: Self-pay

## 2021-09-25 ENCOUNTER — Ambulatory Visit: Payer: Medicare Other | Attending: Family Medicine

## 2021-09-25 DIAGNOSIS — R293 Abnormal posture: Secondary | ICD-10-CM | POA: Insufficient documentation

## 2021-09-25 DIAGNOSIS — R262 Difficulty in walking, not elsewhere classified: Secondary | ICD-10-CM | POA: Insufficient documentation

## 2021-09-25 DIAGNOSIS — M545 Low back pain, unspecified: Secondary | ICD-10-CM | POA: Insufficient documentation

## 2021-09-25 NOTE — Telephone Encounter (Signed)
PT called and left voicemail for patient's son regarding missed visit. Left reminder of next visit and attendance policy.   Eloy End, PT, DPT 09/25/21 12:41 PM

## 2021-10-02 ENCOUNTER — Other Ambulatory Visit: Payer: Self-pay

## 2021-10-02 ENCOUNTER — Ambulatory Visit: Payer: Medicare Other

## 2021-10-02 DIAGNOSIS — M545 Low back pain, unspecified: Secondary | ICD-10-CM | POA: Diagnosis present

## 2021-10-02 DIAGNOSIS — R293 Abnormal posture: Secondary | ICD-10-CM

## 2021-10-02 DIAGNOSIS — R262 Difficulty in walking, not elsewhere classified: Secondary | ICD-10-CM

## 2021-10-02 NOTE — Therapy (Signed)
[facility] Outpatient Rehabilitation [facility] [address] [place], [place], [address] Phone: [phone]   Fax:  [phone]  Physical Therapy Treatment  Patient Details  Name: [name] [name] MRN: [id number] Date of Birth: [birth year] Referring Provider (PT): Doreene Eland, MD   Encounter Date: 10/02/2021   PT End of Session - 10/02/21 1213     Visit Number 13    Number of Visits 15    Date for PT Re-Evaluation 09/27/21    Authorization Type MCR - FOTO on 6th and 10th visit- DC due to language    PT Start Time 1215    PT Stop Time 1258    PT Time Calculation (min) 43 min    Activity Tolerance Patient tolerated treatment well;Patient limited by pain    Behavior During Therapy [facility] for tasks assessed/performed             Past Medical History:  Diagnosis Date   Anxiety    Diabetes (HCC) 10/27/2013   DVT (deep venous thrombosis) (HCC)    Essential tremor 10/27/2013   High cholesterol 1999   Hypertension    Ischemic stroke (HCC) 07/18/2014   Obesity, unspecified 10/27/2013   Pneumonia    Unspecified hereditary and idiopathic peripheral neuropathy 10/27/2013    Past Surgical History:  Procedure Laterality Date   CATARACT EXTRACTION Bilateral    FOOT SURGERY     IR KYPHO LUMBAR INC FX REDUCE BONE BX UNI/BIL CANNULATION INC/IMAGING  06/15/2019    There were no vitals filed for this visit.   Subjective Assessment - 10/02/21 1213     Subjective Pt presents to PT with continued 9/10 back pain with standing. Ready to begin PT at this time.    Currently in Pain? Yes    Pain Score 9     Pain Location Back    Pain Orientation Left;Lower           Therapeutic Exercise: STS 3 20 with and without UE to walker  Seated green tband horizontal abduction 3x10 Standing row 3x10 green tband Bicep curl 3x10 4lb  Seated march 2x20 3lbs Seated hamstring curl 3x10 green tband LAQ 3lbs 2x15 ea Supine with bolster Ball squeeze 2x10 - 5 sec  hold Clamshell 3x15 SLR 2x5 ea Lower trunk rotation x 10  Mini bridge 2x10    Past Interventions Not Performed Today:  Wall l"sit"  and overhead lift 2 x 5 and horizontal abduction for chest opening, spine extension                                PT Short Term Goals - 07/12/21 1149       PT SHORT TERM GOAL #1   Title The patient will be independent with a basic HEP of stretches.    Baseline no HEP    Time 2    Period Weeks    Status Achieved    Target Date 07/11/21               PT Long Term Goals - 09/04/21 1157       PT LONG TERM GOAL #1   Title Pt will decrease 5xSTS time to no greater than 20 sec with decrease UE assistance in order to improve balance and mobility    Baseline can do > 20 sec without UEs x 5, 15 sec with UEs.    Status Achieved      PT LONG TERM  GOAL #2   Title The patient will be able to sit for 45 minutes and then stand up with back pain under or equal to 3/10    Baseline no change    Status On-going      PT LONG TERM GOAL #3   Title The patient will walk x100-120 feet within normal distance of walker SBA for mobility to appointments.    Status On-going                   Plan - 10/02/21 1240     Clinical Impression Statement Pt was once again able to complete all prescribed exercises with no adverse effect or increases in pain. Continues to show improved functional activity tolerance and strength. Her next visit is her last scheduled, will assess goals and continued need for PT. Will otherwise continue to progress as able.    PT Treatment/Interventions ADLs/Self Care Home Management;Cryotherapy;Moist Heat;Electrical Stimulation;Gait training;Stair training;Functional mobility training;Therapeutic activities;Therapeutic exercise;Balance training;Neuromuscular re-education;Manual techniques;Patient/family education;Taping;Dry needling    PT Next Visit Plan posture, measure gait distance UE strength.  Sitting  core, stand as tol.  Did Ok with wedge for supine    PT Home Exercise Plan Access Code: [facility] [place]             Patient will benefit from skilled therapeutic intervention in order to improve the following deficits and impairments:  Abnormal gait, Decreased range of motion, Difficulty walking, Decreased activity tolerance, Pain, Improper body mechanics, Decreased mobility, Decreased strength, Postural dysfunction  Visit Diagnosis: Right low back pain, unspecified chronicity, unspecified whether sciatica present  Abnormal posture  Difficulty in walking, not elsewhere classified     Problem List Patient Active Problem List   Diagnosis Date Noted   Urinary frequency 12/06/2020   History of CVA (cerebrovascular accident) 04/26/2019   CHF (congestive heart failure), NYHA class III, chronic, diastolic (HCC) 11/11/2018   Hypoxemia requiring supplemental oxygen 11/10/2018   Fatigue 11/10/2018   Osteopenia of multiple sites 11/10/2018   Acute flank pain 08/07/2018   Umbilical pain 10/31/2017   Back pain 10/10/2016   Pelvic pain 10/10/2016   Seizures (HCC) 08/06/2014   Abnormal involuntary movement 08/05/2014   Hemiplegia, unspecified, affecting dominant side 08/05/2014   Right sided weakness 08/05/2014   CVA (cerebral vascular accident) (HCC) 07/21/2014   Left leg pain 07/20/2014   HTN (hypertension) 07/18/2014   HLD (hyperlipidemia) 07/18/2014   Thrombocytopenia (HCC) 07/18/2014   Essential tremor 10/27/2013   Obesity, unspecified 10/27/2013   DM2 (diabetes mellitus, type 2) (HCC) 10/27/2013   History of peripheral neuropathy 10/27/2013    Eloy End, PT 10/02/2021, 1:01 PM  [facility] Outpatient Rehabilitation [facility] [address] [place], [place], [address] Phone: [phone]   Fax:  [phone]  Name: [name] [name] MRN: [id number] Date of Birth: 04-17-1930

## 2021-10-09 ENCOUNTER — Other Ambulatory Visit: Payer: Self-pay

## 2021-10-09 ENCOUNTER — Ambulatory Visit: Payer: Medicare Other

## 2021-10-09 DIAGNOSIS — M545 Low back pain, unspecified: Secondary | ICD-10-CM

## 2021-10-09 DIAGNOSIS — R293 Abnormal posture: Secondary | ICD-10-CM

## 2021-10-09 DIAGNOSIS — R262 Difficulty in walking, not elsewhere classified: Secondary | ICD-10-CM

## 2021-10-09 NOTE — Therapy (Signed)
[place], [place], [address] Phone: [phone]   Fax:  [phone]  Physical Therapy Treatment/Discharge  Patient Details  Name: [name] [name] MRN: [id number] Date of Birth: Apr 09, 1930 Referring Provider (PT): Kinnie Feil, MD   Encounter Date: 10/09/2021   PT End of Session - 10/09/21 1213     Visit Number 14    Number of Visits 15    Date for PT Re-Evaluation 09/27/21    Authorization Type MCR - FOTO on 6th and 10th visit- DC due to language    PT Start Time 1215    PT Stop Time 1247    PT Time Calculation (min) 32 min    Activity Tolerance Patient tolerated treatment well;Patient limited by pain    Behavior During Therapy [facility] for tasks assessed/performed             Past Medical History:  Diagnosis Date   Anxiety    Diabetes ([place]) 10/27/2013   DVT (deep venous thrombosis) (HCC)    Essential tremor 10/27/2013   High cholesterol 1999   Hypertension    Ischemic stroke ([place]) 07/18/2014   Obesity, unspecified 10/27/2013   Pneumonia    Unspecified hereditary and idiopathic peripheral neuropathy 10/27/2013    Past Surgical History:  Procedure Laterality Date   CATARACT EXTRACTION Bilateral    FOOT SURGERY     IR KYPHO LUMBAR INC FX REDUCE BONE BX UNI/BIL CANNULATION INC/IMAGING  06/15/2019    There were no vitals filed for this visit.   Subjective Assessment - 10/09/21 1213     Subjective Pt presents to PT with continued reports of LBP. She has been fairly compliant with HEP per son report. They are ready to begin PT treatment at this time.    Currently in Pain? Yes    Pain Score 9     Pain Location Back           Therapeutic Exercise: Seated blue tband horizontal abduction 2x15 Seated row 2x15 blue tband Seated march 2x20 blue tband Seated clamshell 2x15 blue tband LAQ red tband 2x10 Seated thoracic extension x 10 - 5 sec Serratus lift w/ red tband  2x10                             PT Short Term Goals - 07/12/21 1149       PT SHORT TERM GOAL #1   Title The patient will be independent with a basic HEP of stretches.    Baseline no HEP    Time 2    Period Weeks    Status Achieved    Target Date 07/11/21               PT Long Term Goals - 10/09/21 1258       PT LONG TERM GOAL #1   Title Pt will decrease 5xSTS time to no greater than 20 sec with decrease UE assistance in order to improve balance and mobility    Baseline can do > 20 sec without UEs x 5, 15 sec with UEs.    Status Achieved      PT LONG TERM GOAL #2   Title The patient will be able to sit for 45 minutes and then stand up with back pain under or equal to 3/10    Baseline no change    Status Partially Met      PT LONG TERM GOAL #3  Title The patient will walk x100-120 feet within normal distance of walker SBA for mobility to appointments.    Status Achieved                   Plan - 10/09/21 1252     Clinical Impression Statement Pt was able to complete all prescribed exercises with no adverse effect and demonstrated knowledge of HEP. Over the course of PT treatment, she has noted subjective improvement in functional activity tolerance and strength. Her son notes that she seems to be improving in mobility and not mentioning pain in her lower back as frequently. She has seemed to plateau in status over last few weeks and after discussion pt and son were agreeable to discharge at this time. She should continue to maintain improvement with HEP compliance and is being discharged at this time.    PT Treatment/Interventions ADLs/Self Care Home Management;Cryotherapy;Moist Heat;Electrical Stimulation;Gait training;Stair training;Functional mobility training;Therapeutic activities;Therapeutic exercise;Balance training;Neuromuscular re-education;Manual techniques;Patient/family education;Taping;Dry needling    PT Home Exercise Plan  Access Code: [email or web address]             Patient will benefit from skilled therapeutic intervention in order to improve the following deficits and impairments:  Abnormal gait, Decreased range of motion, Difficulty walking, Decreased activity tolerance, Pain, Improper body mechanics, Decreased mobility, Decreased strength, Postural dysfunction  Visit Diagnosis: Right low back pain, unspecified chronicity, unspecified whether sciatica present  Abnormal posture  Difficulty in walking, not elsewhere classified     Problem List Patient Active Problem List   Diagnosis Date Noted   Urinary frequency 12/06/2020   History of CVA (cerebrovascular accident) 04/26/2019   CHF (congestive heart failure), NYHA class III, chronic, diastolic ([place]) 28/36/6294   Hypoxemia requiring supplemental oxygen 11/10/2018   Fatigue 11/10/2018   Osteopenia of multiple sites 11/10/2018   Acute flank pain 76/54/6503   Umbilical pain 54/65/6812   Back pain 10/10/2016   Pelvic pain 10/10/2016   Seizures ([place]) 08/06/2014   Abnormal involuntary movement 08/05/2014   Hemiplegia, unspecified, affecting dominant side 08/05/2014   Right sided weakness 08/05/2014   CVA (cerebral vascular accident) ([place]) 07/21/2014   Left leg pain 07/20/2014   HTN (hypertension) 07/18/2014   HLD (hyperlipidemia) 07/18/2014   Thrombocytopenia ([place]) 07/18/2014   Essential tremor 10/27/2013   Obesity, unspecified 10/27/2013   DM2 (diabetes mellitus, type 2) ([place]) 10/27/2013   History of peripheral neuropathy 10/27/2013    Ward Chatters, PT 10/09/2021, 12:59 PM  [facility] [address] [place], [place], [address] Phone: [phone]   Fax:  [phone]  Name: [name] [name] MRN: [id number] Date of Birth: [birth year]  PHYSICAL THERAPY DISCHARGE SUMMARY  Visits from Start of Care: 14  Current functional level related to goals / functional outcomes: See goals and  assessment   Remaining deficits: See goals and assessment   Education / Equipment: HEP   Patient agrees to discharge. Patient goals were  mostly met . Patient is being discharged due to maximized rehab potential.

## 2021-10-15 ENCOUNTER — Telehealth: Payer: Self-pay

## 2021-10-15 DIAGNOSIS — R32 Unspecified urinary incontinence: Secondary | ICD-10-CM

## 2021-10-15 NOTE — Telephone Encounter (Signed)
Patient calls nurse line requesting new order for depends and bed pads.   Please place this DME order and route back to RN team for processing.   Orders will then need to be faxed to [employer] at [phone].  Veronda Prude, RN

## 2021-10-15 NOTE — Telephone Encounter (Signed)
DME orders placed.

## 2021-10-16 NOTE — Telephone Encounter (Signed)
Faxed to [employer] at [phone].  Veronda Prude, RN

## 2022-01-07 ENCOUNTER — Other Ambulatory Visit: Payer: Self-pay | Admitting: Neurology

## 2022-01-07 ENCOUNTER — Other Ambulatory Visit: Payer: Self-pay | Admitting: Specialist

## 2022-01-07 ENCOUNTER — Other Ambulatory Visit: Payer: Self-pay | Admitting: Family Medicine

## 2022-01-07 DIAGNOSIS — G250 Essential tremor: Secondary | ICD-10-CM

## 2022-01-07 DIAGNOSIS — R531 Weakness: Secondary | ICD-10-CM

## 2022-01-07 DIAGNOSIS — G40109 Localization-related (focal) (partial) symptomatic epilepsy and epileptic syndromes with simple partial seizures, not intractable, without status epilepticus: Secondary | ICD-10-CM

## 2022-01-07 DIAGNOSIS — E785 Hyperlipidemia, unspecified: Secondary | ICD-10-CM

## 2022-03-05 ENCOUNTER — Ambulatory Visit (INDEPENDENT_AMBULATORY_CARE_PROVIDER_SITE_OTHER): Payer: Medicare Other | Admitting: Neurology

## 2022-03-05 ENCOUNTER — Encounter: Payer: Self-pay | Admitting: Neurology

## 2022-03-05 VITALS — BP 112/59 | HR 78 | Ht 60.0 in | Wt 156.4 lb

## 2022-03-05 DIAGNOSIS — G250 Essential tremor: Secondary | ICD-10-CM | POA: Diagnosis not present

## 2022-03-05 DIAGNOSIS — Z8673 Personal history of transient ischemic attack (TIA), and cerebral infarction without residual deficits: Secondary | ICD-10-CM

## 2022-03-05 DIAGNOSIS — E0842 Diabetes mellitus due to underlying condition with diabetic polyneuropathy: Secondary | ICD-10-CM | POA: Diagnosis not present

## 2022-03-05 DIAGNOSIS — G40109 Localization-related (focal) (partial) symptomatic epilepsy and epileptic syndromes with simple partial seizures, not intractable, without status epilepticus: Secondary | ICD-10-CM

## 2022-03-05 MED ORDER — GABAPENTIN 100 MG PO CAPS: 100.0000 mg | ORAL_CAPSULE | Freq: Every day | ORAL | 3 refills | Status: DC

## 2022-03-05 MED ORDER — LEVETIRACETAM 500 MG PO TABS: 500.0000 mg | ORAL_TABLET | Freq: Two times a day (BID) | ORAL | 3 refills | Status: DC

## 2022-03-05 MED ORDER — PRIMIDONE 250 MG PO TABS: 250.0000 mg | ORAL_TABLET | Freq: Two times a day (BID) | ORAL | 3 refills | Status: DC

## 2022-03-05 NOTE — Patient Instructions (Signed)
It was so nice to see you both as always. I am glad to see that things are stable. Your tremor is stable. Let's continue your meds at the same dose. I renewed your prescriptions for primidone, gabapentin and Keppra. See you in one year! Take care! ?

## 2022-03-05 NOTE — Progress Notes (Signed)
Subjective:  ?  ?Patient ID: [name] HoleFatemeh B [name] is a 88 y.o. female. ? ?HPI ? ? ? ?Interim history:  ? ?[name] [name] is a very pleasant 88 year old [place] lady with an underlying medical history of hypertension, hyperlipidemia, DVT, pneumonia, anxiety, obesity, diabetes, peripheral neuropathy, stroke and essential tremor, who presents for follow-up consultation of her essential tremor, and prior history of seizure and stroke. The patient is accompanied by her son again today. I last saw her on 03/01/2021, at which time she felt that her tremor was a little worse.  We continued her Mysoline at 250 mg twice daily, I did not feel comfortable increasing it.  She had pain in her feet.  She was already on Cymbalta and gabapentin low-dose, we had reduced the gabapentin before because of side effects and I suggested we do not increase any of her medications for fear of side effects.  She was advised to use her walker at all times. ? ?Today, 03/05/2022: She reports overall feeling stable. No falls, still able to do her ADLs fairly independently, good appetite, no seizures, no new symptoms, using her walker at all times, trying to hydrate well with water, sometimes it feels like she is trembling inside, legs feel heavy at times, which is not a new sensation.  Taking Mysoline twice daily without side effects, Keppra generic twice daily at 500 mg strength and low-dose gabapentin, 100 mg at bedtime. ?  ?Previously:  ? ?I saw her on 05/30/2020, at which time she reported that her tremor was worse, sometimes she feels an inner tremor in addition to her hand and head tremor. It was hard for her to feed herself. She denied any recent new symptoms such as one-sided weakness or numbness or tingling or seizures or falls. Her heart doctor told her that she could skip or reduce her Lasix as long as she avoided excess salt.  She had not had much in the way of swelling. I did not suggest increasing her Mysoline given that she was already on a  higher dose.  She was advised to routinely follow-up in this clinic in 6 months. ?  ?  ?I saw her on 10/27/2019, at which time she was fairly stable.  Sometimes she felt that her right side was a little weaker, she had residual weakness from the stroke in the past.  She was advised to look into using weighted utensils to be able to feed herself a little better. ?  ?Her son called in the interim with concern of worsening tremor.  I also received a staff message from her primary care physician regarding this.  I did not suggest increasing her Mysoline as she is already on a high dose. ?  ?  ?I saw her on 09/07/2018, at which time she was stable.  She had issues with back pain, she had been found to have osteopenia.  She had no recent falls.  She has had right-sided residual weakness from her stroke.  Her tremor has been somewhat progressive.  She had no recent seizures.  ?  ?She saw Shawnie DapperAmy Lomax, nurse practitioner in the interim with a phone call visit on 04/26/2019, at which time she was stable.  ?  ?I saw her on 07/08/2017 for a sooner than scheduled appointment because she felt that the tremor was worse, she also had intermittent leg cramps. She had issues with low back pain. She had a recent lumbar spine MRI. She was supposed to see Dr. Alvester MorinNewton for injection into the  spine. She was advised to continue with low-dose gabapentin, Mysoline twice daily, and Keppra twice daily. ?  ?  ?I saw her on 11/11/2016, at which time her tremor was mostly stable, she had no recent falls. Her right leg weakness was about the same as well. She did present to the emergency room in November 2017 for back pain and leg pain. She was found to have a UTI.  ?She was also found on CT lumbar spine to have a L1 vertebral body fracture, old.  ?  ?She was seen in the interim by Butch Penny on 05/13/2017, at which time she was noted to be fairly stable and her medications were kept the same. ?  ?She presented in the interim to the emergency room on  05/31/2017 for hip pain on the left. She saw her family physician on 07/02/2017. ?  ?I saw her on 05/08/2016, at which time she had recently fallen about 10 days prior. She had visited her grandmother daughter who is expecting a baby and fell backwards landed on her back. She had left-sided back pain since then. She went to the emergency room on 05/04/2016 and was given an injection with Decadron, Toradol, Dilaudid, but did not have much in way of relief. She had a bone scan on 05/06/16, which I reviewed: IMPRESSION: ?1. No evidence of a recent fracture. ?2. No evidence of neoplastic disease to bone. ?3. Significant degenerative uptake noted along spine as well as ?involving multiple bilateral joints. ?She reported that her tremor was worse. She had more stress and also more pain. ?  ?I suggested she continue with her Keppra, Plavix, Mysoline and Neurontin. ?  ?I saw her on 09/25/15, at which time she felt she was doing well. She was worried about her brother in [place]. She was not able to travel to [place] and was sad about it. She was planning to fly to [place] to be with her daughter and other family for some time. No recent seizures or strokelike symptoms were reported. Tremor was stable. She was using a cane and had no recent falls. I suggested we continue with Keppra, Plavix, gabapentin and Mysoline at the same doses. ?  ?I saw her on 04/26/2015 at which time she reported difficulty with her balance. Her tremor had become a little worse. She had had no recent seizures or auras. She was able to tolerate Keppra 500 mg twice daily. She was on the same dose of Mysoline for her tremors. She had benefited from physical therapy at [facility] and requested to go back for outpatient physical therapy. She had not fallen. She was using a 4 pronged cane and was no longer using her walker. ?  ?I saw her on 11/16/2014, at which time her son reported that she was doing well. She was in outpatient physical therapy and  was using a 2 wheeled walker. She was taking gabapentin at a lower dose. Lowering gabapentin had improved her heavy headedness in the morning. Her tremor was stable. She was tolerating Keppra 500 mg twice daily with no recent seizures or auras reported. Overall she has done well. I continued her on the same dose of primidone, the same dose of Keppra, and suggested we reduce her gabapentin further to 100 mg in the morning and 200 mg in the evening.  ?  ?Of note, they canceled an appointment for 03/17/2015. ?  ?I saw her on 08/19/2014 at which time she presented after her hospitalization for stroke. Her son reported  that Aggrenox was causing her headaches and she was switched to Plavix. We talked about sleep apnea screening. She had no witnessed apneas and only mild snoring. She had done fairly well. They requested a referral to outpatient therapy in [facility]. She was placed on Keppra for episodic twitching. She had had some medication changes when she was hospitalized for her stroke. She was advised to use her 2 wheeled walker with assistance. I referred her to physical therapy outpatient. We talked about secondary stroke prevention. I suggested she reduce gabapentin to 200 mg twice daily down from 300 mg twice daily because of dizziness and head heaviness reported. This continued on Mysoline for her essential tremor. ?  ?I saw her on 10/27/2013 at which time she presented for followup of her long-standing history of essential tremor. I felt she was stable in that regard and felt that her diabetes had resulted in diabetic neuropathy. I asked her to continue with Mysoline at the same strength and increased her gabapentin to 3 pills at night for a total of 300 mg each bedtime.   ?In the interim she presented to the emergency room on 07/18/2014 with new onset right-sided weakness, facial droop, slurring of speech which started around 6 PM the night before. She was in [place], [place] visiting family when she  had acute onset of the symptoms. She was seen in [place] at the hospital and CT head was negative for bleed but she did not receive TPA because of low platelet count of 90,000. She had improvement of

## 2022-05-10 ENCOUNTER — Other Ambulatory Visit: Payer: Self-pay

## 2022-05-10 ENCOUNTER — Other Ambulatory Visit: Payer: Self-pay | Admitting: Family Medicine

## 2022-05-10 MED ORDER — LIDOCAINE 5 % EX PTCH: 1.0000 | MEDICATED_PATCH | CUTANEOUS | 11 refills | Status: DC

## 2022-05-13 ENCOUNTER — Telehealth: Payer: Self-pay

## 2022-05-13 NOTE — Telephone Encounter (Signed)
Prior Auth for patients medication LIDOCAINE PATCHES denied by [employer] [facility] [employer] via CoverMyMeds.   Reason: covered when used for: A) Pain associated with post-herpetic neuralgia, B) Pain associated with diabetic neuropathy, C) Pain associated with cancer-related neuropathy (including treatment-related neuropathy [e.g., neuropathy associated with radiation treatment or chemotherapy])  Appeal available if needed. Denial scanned to chart.  CoverMyMeds Key: [id number]

## 2022-05-13 NOTE — Telephone Encounter (Signed)
A Prior Authorization was initiated for this patients LIDOCAINE PATCHES through CoverMyMeds.   Key: [id number]

## 2022-05-22 ENCOUNTER — Telehealth: Payer: Self-pay

## 2022-05-22 NOTE — Telephone Encounter (Signed)
Patients son calls nurse line in regards to incontinence supplies.   Son reports he spoke with [employer] and they reported a CMN form needs to be signed.   I have asked [employer] to send the required documentation over for PCP to sign.

## 2022-05-23 ENCOUNTER — Telehealth: Payer: Self-pay

## 2022-05-23 NOTE — Telephone Encounter (Signed)
Patient's [name] came in for dermatology.  He states that his mother needs a DME for adult pull ups.  He states that he has called the nurse line on several occassions with no response.  Glennie Hawk, CMA

## 2022-05-23 NOTE — Telephone Encounter (Signed)
Would prefer to have patient seen in clinic first as she has not been seen in about 1 year and I have not seen her before. Can sign if supplies are needed urgently but please schedule her an appointment with me. Thanks!

## 2022-05-24 NOTE — Telephone Encounter (Signed)
Patient will need office visit for forms to be filled out, please schedule an appointment for her. Thanks!

## 2022-05-27 NOTE — Telephone Encounter (Signed)
Attempted to call patients son to schedule an apt.   No answer or option for VM.

## 2022-06-05 NOTE — Telephone Encounter (Signed)
Patient scheduled for 7/20.

## 2022-06-05 NOTE — Telephone Encounter (Signed)
Patient has appointment with Dr. Velna Ochs on 06/13/22 at 3:15. Aquilla Solian, CMA

## 2022-06-11 NOTE — Telephone Encounter (Signed)
Sure no problem I will take care of it thank you.

## 2022-06-13 ENCOUNTER — Ambulatory Visit: Payer: Medicare Other | Admitting: Family Medicine

## 2022-06-13 NOTE — Progress Notes (Unsigned)
     SUBJECTIVE:   CHIEF COMPLAINT / HPI:   [name] [name] is a 88 y.o. female presents for follow-up  Presents today with her son who interpreted for her  Son reports that patient needs incontinence pads and pull-ups.  Paperwork is been provided to PCP to fill out.  Son is also requesting general  yearly lab work today.  Did not bring meds today.   Flowsheet Row Office Visit from 06/14/2022 in [facility]  PHQ-9 Total Score 4        Health Maintenance Due  Topic   OPHTHALMOLOGY EXAM    TETANUS/TDAP    Zoster Vaccines- Shingrix (1 of 2)   FOOT EXAM      PERTINENT  PMH / PSH: CHF, CVA, hypertension  OBJECTIVE:   BP 122/80   Pulse 95   Ht 5' (1.524 m)   Wt 153 lb (69.4 kg)   SpO2 96%   BMI 29.88 kg/m    General: Alert, no acute distress, frail appearing Cardio: well perfused  Pulm: normal work of breathing Neuro: Cranial nerves grossly intact   ASSESSMENT/PLAN:   Urinary incontinence Completed paperwork for incontinence supplies.  Health maintenance examination Obtained BMP, lipid panel A1c today per son's request.  Follow-up booked for physical with PCP Dr. Wynelle Link next month on 8/22.  Encouraged patient to bring her medications in at the next visit for medication reconciliation.    Towanda Octave, MD PGY-3 [facility]

## 2022-06-14 ENCOUNTER — Ambulatory Visit (INDEPENDENT_AMBULATORY_CARE_PROVIDER_SITE_OTHER): Payer: Medicare Other | Admitting: Family Medicine

## 2022-06-14 ENCOUNTER — Encounter: Payer: Self-pay | Admitting: Family Medicine

## 2022-06-14 VITALS — BP 122/80 | HR 95 | Ht 60.0 in | Wt 153.0 lb

## 2022-06-14 DIAGNOSIS — R32 Unspecified urinary incontinence: Secondary | ICD-10-CM

## 2022-06-14 DIAGNOSIS — Z0000 Encounter for general adult medical examination without abnormal findings: Secondary | ICD-10-CM

## 2022-06-14 DIAGNOSIS — Z131 Encounter for screening for diabetes mellitus: Secondary | ICD-10-CM | POA: Diagnosis not present

## 2022-06-14 DIAGNOSIS — I10 Essential (primary) hypertension: Secondary | ICD-10-CM

## 2022-06-14 DIAGNOSIS — R739 Hyperglycemia, unspecified: Secondary | ICD-10-CM

## 2022-06-14 DIAGNOSIS — E785 Hyperlipidemia, unspecified: Secondary | ICD-10-CM

## 2022-06-14 LAB — POCT GLYCOSYLATED HEMOGLOBIN (HGB A1C): HbA1c, POC (controlled diabetic range): 5.6 % (ref 0.0–7.0)

## 2022-06-14 NOTE — Patient Instructions (Signed)
Thank you for coming to see me today. It was a pleasure. I will sign order for pull up and pads.  We will get some labs today.  If they are abnormal or we need to do something about them, I will call you.  If they are normal, I will send you a message on MyChart (if it is active) or a letter in the mail.  If you don't hear from [place] in 2 weeks, please call the office at the number below.   Please follow-up with [place] in 4 weeks   If you have any questions or concerns, please do not hesitate to call the office at [phone].  Best wishes,   Dr Allena Katz

## 2022-06-15 ENCOUNTER — Other Ambulatory Visit: Payer: Self-pay | Admitting: Family Medicine

## 2022-06-15 DIAGNOSIS — E785 Hyperlipidemia, unspecified: Secondary | ICD-10-CM

## 2022-06-15 DIAGNOSIS — R531 Weakness: Secondary | ICD-10-CM

## 2022-06-15 LAB — LIPID PANEL
Chol/HDL Ratio: 2.3 ratio (ref 0.0–4.4)
Cholesterol, Total: 180 mg/dL (ref 100–199)
HDL: 79 mg/dL (ref >39)
LDL Chol Calc (NIH): 90 mg/dL (ref 0–99)
Triglycerides: 58 mg/dL (ref 0–149)
VLDL Cholesterol Cal: 11 mg/dL (ref 5–40)

## 2022-06-15 LAB — BASIC METABOLIC PANEL
BUN/Creatinine Ratio: 22 (ref 12–28)
BUN: 17 mg/dL (ref 8–27)
CO2: 23 mmol/L (ref 20–29)
Calcium: 9.2 mg/dL (ref 8.7–10.3)
Chloride: 103 mmol/L (ref 96–106)
Creatinine, Ser: 0.78 mg/dL (ref 0.57–1.00)
Glucose: 71 mg/dL (ref 70–99)
Potassium: 4.9 mmol/L (ref 3.5–5.2)
Sodium: 141 mmol/L (ref 134–144)
eGFR: 73 mL/min/{1.73_m2} (ref >59)

## 2022-06-16 DIAGNOSIS — Z0000 Encounter for general adult medical examination without abnormal findings: Secondary | ICD-10-CM | POA: Insufficient documentation

## 2022-06-16 DIAGNOSIS — R32 Unspecified urinary incontinence: Secondary | ICD-10-CM | POA: Insufficient documentation

## 2022-06-16 NOTE — Assessment & Plan Note (Addendum)
Obtained BMP, lipid panel A1c today per son's request.  Follow-up booked for physical with PCP Dr. Wynelle Link next month on 8/22.  Encouraged patient to bring her medications in at the next visit for medication reconciliation.

## 2022-06-16 NOTE — Assessment & Plan Note (Signed)
Completed paperwork for incontinence supplies.

## 2022-06-17 NOTE — Telephone Encounter (Signed)
**  UPDATE**  Prior Auth for patients medication LIDOCAINE PATCHES approved by [employer] from 11/25/21 to 06/16/13.  REC'D APPROVAL FAX 06/17/22 APPROVAL LETTER SCANNED TO CHART.

## 2022-07-01 ENCOUNTER — Telehealth: Payer: Self-pay

## 2022-07-01 NOTE — Telephone Encounter (Signed)
Received phone call from patient's son regarding issues with obtaining incontinence supplies.   Called [employer]. They need a PECOS certified provider to sign CMN. They are faxing over new paperwork now, addressed to Dr. McDiarmid.   Will place in Dr. McDiarmid's box for completion.   Veronda Prude, RN

## 2022-07-16 ENCOUNTER — Ambulatory Visit: Payer: Medicare Other | Admitting: Family Medicine

## 2022-07-17 NOTE — Telephone Encounter (Signed)
[employer] calls nurse line checking status of CMN form.   I asked for CMN to be refaxed as they have not received it back.   McDiarmid must sign this form as the attending physician.

## 2022-07-26 ENCOUNTER — Encounter: Payer: Self-pay | Admitting: Family Medicine

## 2022-07-26 ENCOUNTER — Ambulatory Visit (INDEPENDENT_AMBULATORY_CARE_PROVIDER_SITE_OTHER): Payer: Medicare Other | Admitting: Family Medicine

## 2022-07-26 VITALS — BP 118/74 | HR 84 | Wt 150.0 lb

## 2022-07-26 DIAGNOSIS — G8929 Other chronic pain: Secondary | ICD-10-CM

## 2022-07-26 DIAGNOSIS — Z0000 Encounter for general adult medical examination without abnormal findings: Secondary | ICD-10-CM | POA: Diagnosis present

## 2022-07-26 DIAGNOSIS — M25562 Pain in left knee: Secondary | ICD-10-CM | POA: Diagnosis not present

## 2022-07-26 MED ORDER — DICLOFENAC SODIUM 1 % EX GEL: 2.0000 g | Freq: Four times a day (QID) | CUTANEOUS | 2 refills | Status: DC | PRN

## 2022-07-26 NOTE — Assessment & Plan Note (Signed)
Suspect underlying OA. - XR knees bilateral standing - continue diclofenac gel - ref PT - consider corticosteroid injection at follow-up if not improved

## 2022-07-26 NOTE — Progress Notes (Signed)
    SUBJECTIVE:   CHIEF COMPLAINT / HPI:  Chief Complaint  Patient presents with   Annual Exam   Leg Pain    Here with son.  Only concern today is left knee pain ongoing for several months, worse at night. Joint locks often.  Pain is primarily on the kneecap.  Has morning stiffness which improves within 30 minutes.  No falls.  Manages her own medications.  Relies on son to drive her around.  Son also does the shopping.  PERTINENT  PMH / PSH: CVA, ET, HTN, HLD, seizures,   Patient Care Team: [name], MD as PCP - General (Family Medicine)  Cardiology - [facility] - [facility] - [facility]?  OBJECTIVE:   BP 118/74   Pulse 84   Wt 150 lb (68 kg)   SpO2 97%   BMI 29.29 kg/m   Physical Exam Constitutional:      General: She is not in acute distress. Cardiovascular:     Rate and Rhythm: Normal rate and regular rhythm.  Pulmonary:     Effort: Pulmonary effort is normal. No respiratory distress.     Breath sounds: Normal breath sounds.  Musculoskeletal:     Cervical back: Neck supple.  Skin:    Comments: Bilateral knees with no obvious deformity or swelling.  There is tenderness to palpation along the superior patella and medial and lateral joint lines.  She does have some pain with flexion of the knee.  Varus valgus stressing intact.  Negative anterior and posterior drawer.  Neurological:     Mental Status: She is alert.     Comments: Significant head tremor noted and bilateral intention hand tremor         07/26/2022    1:52 PM  Depression screen PHQ 2/9  Decreased Interest 1  Down, Depressed, Hopeless 2  PHQ - 2 Score 3  Altered sleeping 1  Tired, decreased energy 1  Change in appetite 3  Feeling bad or failure about yourself  0  Trouble concentrating 0  Moving slowly or fidgety/restless 0  Suicidal thoughts 0  PHQ-9 Score 8     {Show previous vital signs (optional):23777}    ASSESSMENT/PLAN:   Chronic pain of left knee Suspect  underlying OA. - XR knees bilateral standing - continue diclofenac gel - ref PT - consider corticosteroid injection at follow-up if not improved   HCM - Tdap discussed, patient will receive this at her pharmacy  Return in about 2 months (around 09/25/2022) for f/u knee pain.   [name], MD [facility]

## 2022-07-26 NOTE — Patient Instructions (Addendum)
It was nice seeing you today!  Continue using voltaren gel as needed for knee pain.  Go to your pharmacy to get the Tdap vaccine.  [facility] Address: [address], [place], [place] [address] Phone: [phone]   Stay well, [name], MD [facility] [phone]  --  Make sure to check out at the front desk before you leave today.  Please arrive at least 15 minutes prior to your scheduled appointments.  If you had blood work today, I will send you a MyChart message or a letter if results are normal. Otherwise, I will give you a call.  If you had a referral placed, they will call you to set up an appointment. Please give [place] a call if you don't hear back in the next 2 weeks.  If you need additional refills before your next appointment, please call your pharmacy first.

## 2022-07-31 ENCOUNTER — Ambulatory Visit
Admission: RE | Admit: 2022-07-31 | Discharge: 2022-07-31 | Disposition: A | Discharge status: Home / Self Care | Payer: Medicare Other | Source: Ambulatory Visit | Attending: Family Medicine | Admitting: Family Medicine

## 2022-07-31 ENCOUNTER — Other Ambulatory Visit: Payer: Self-pay | Admitting: Family Medicine

## 2022-07-31 DIAGNOSIS — G8929 Other chronic pain: Secondary | ICD-10-CM

## 2022-07-31 DIAGNOSIS — R52 Pain, unspecified: Secondary | ICD-10-CM

## 2022-08-01 ENCOUNTER — Encounter: Payer: Self-pay | Admitting: Family Medicine

## 2022-08-15 ENCOUNTER — Telehealth: Payer: Self-pay

## 2022-08-15 DIAGNOSIS — R32 Unspecified urinary incontinence: Secondary | ICD-10-CM

## 2022-08-15 NOTE — Telephone Encounter (Signed)
Son calls nurse line requesting DME order for bed pads.   Son reports they received all incontinence supplies except the bed pads.   Order has been placed, however [facility] reports they do not have this.   Please place an updated order for bed pads. Order in epic is > 6 months old. 09/2021.  Will send to [employer].

## 2022-08-15 NOTE — Telephone Encounter (Signed)
Received confirmation from [employer].   They are processing order for patient.   Will reach out with any questions or concerns.

## 2022-08-15 NOTE — Telephone Encounter (Signed)
Community message sent to [employer] for processing.   Will await response.  

## 2022-08-15 NOTE — Telephone Encounter (Signed)
Bed pad order placed, let me know if any issues

## 2022-08-19 ENCOUNTER — Ambulatory Visit: Payer: Medicare Other | Attending: Family Medicine

## 2022-08-19 DIAGNOSIS — M25562 Pain in left knee: Secondary | ICD-10-CM | POA: Insufficient documentation

## 2022-08-19 DIAGNOSIS — G8929 Other chronic pain: Secondary | ICD-10-CM | POA: Diagnosis present

## 2022-08-19 DIAGNOSIS — M6281 Muscle weakness (generalized): Secondary | ICD-10-CM | POA: Insufficient documentation

## 2022-08-19 DIAGNOSIS — R2689 Other abnormalities of gait and mobility: Secondary | ICD-10-CM | POA: Diagnosis present

## 2022-08-19 DIAGNOSIS — R293 Abnormal posture: Secondary | ICD-10-CM | POA: Diagnosis present

## 2022-08-19 NOTE — Therapy (Signed)
OUTPATIENT PHYSICAL THERAPY LOWER EXTREMITY EVALUATION   Patient Name: [name] [name] MRN: [id number] DOB:[birth year], 88 y.o., female Today's Date: 08/20/2022   PT End of Session - 08/20/22 0846     Visit Number 1    Number of Visits 17    Date for PT Re-Evaluation 10/15/22    Authorization Type Medicare    PT Start Time 1400    PT Stop Time 1430    PT Time Calculation (min) 30 min    Activity Tolerance Patient tolerated treatment well;Patient limited by pain    Behavior During Therapy [facility] for tasks assessed/performed             Past Medical History:  Diagnosis Date   Anxiety    Diabetes ([place]) 10/27/2013   DVT (deep venous thrombosis) (HCC)    Essential tremor 10/27/2013   High cholesterol 1999   Hypertension    Ischemic stroke ([place]) 07/18/2014   Obesity, unspecified 10/27/2013   Pneumonia    Unspecified hereditary and idiopathic peripheral neuropathy 10/27/2013   Past Surgical History:  Procedure Laterality Date   CATARACT EXTRACTION Bilateral    FOOT SURGERY     IR KYPHO LUMBAR INC FX REDUCE BONE BX UNI/BIL CANNULATION INC/IMAGING  06/15/2019   Patient Active Problem List   Diagnosis Date Noted   Chronic pain of left knee 07/26/2022   Urinary incontinence 06/16/2022   Urinary frequency 12/06/2020   History of CVA (cerebrovascular accident) 04/26/2019   CHF (congestive heart failure), NYHA class III, chronic, diastolic ([place]) 26/83/4196   Hypoxemia requiring supplemental oxygen 11/10/2018   Fatigue 11/10/2018   Osteopenia of multiple sites 11/10/2018   Back pain 10/10/2016   Seizures ([place]) 08/06/2014   Hemiplegia, unspecified, affecting dominant side 08/05/2014   Right sided weakness 08/05/2014   CVA (cerebral vascular accident) ([place]) 07/21/2014   HTN (hypertension) 07/18/2014   HLD (hyperlipidemia) 07/18/2014   Thrombocytopenia ([place]) 07/18/2014   Essential tremor 10/27/2013   Obesity, unspecified 10/27/2013   History of peripheral neuropathy 10/27/2013     PCP: Lenoria Chime, MD  REFERRING PROVIDER: Lenoria Chime, MD  REFERRING DIAG:  [phone] (ICD-10-CM) - Chronic pain of left knee  THERAPY DIAG:  Chronic pain of left knee - Plan: PT plan of care cert/re-cert  Muscle weakness (generalized) - Plan: PT plan of care cert/re-cert  Other abnormalities of gait and mobility - Plan: PT plan of care cert/re-cert  Abnormal posture - Plan: PT plan of care cert/re-cert  Rationale for Evaluation and Treatment Rehabilitation  ONSET DATE: Chronic  SUBJECTIVE:   SUBJECTIVE STATEMENT: Pt presents to PT with son to assist in translation reporting chronic L knee pain. Pt is well known to therapist and has previously been seen in PT for chronic back pain. She notes severe pain when walking or during transfers, especially with transferring into the car. Notes pain in back of L LE as well, particularly in posterior L knee. Pain seems to run from her L hip to distal L knee.   PERTINENT HISTORY: HTN, CVA, CHF, Osteopenia   PAIN:  Are you having pain?  Yes: NPRS scale: 9/10 (10/10 at worst) Pain location: L hip, L knee Pain description: sharp, tight Aggravating factors: transfers, walking Relieving factors: rest  PRECAUTIONS: Fall  WEIGHT BEARING RESTRICTIONS No  FALLS:  Has patient fallen in last 6 months? No  LIVING ENVIRONMENT: Lives with: lives with their family Lives in: House/apartment Stairs:  Has following equipment at home: Gilford Rile - 2 wheeled  OCCUPATION: Retired  PLOF: Independent and Independent with basic ADLs  PATIENT GOALS: Pt wants to decrease pain and improve ability with walking and car trasnfers   OBJECTIVE:   DIAGNOSTIC FINDINGS:   See imaging   PATIENT SURVEYS:  KOOS (short form): will perform next session  COGNITION:  Overall cognitive status: Within functional limits for tasks assessed     SENSATION: WFL  POSTURE: rounded shoulders, forward head, increased thoracic kyphosis, and  medium body habitus  PALPATION: TTP to distal L lateral hamstring tendon, medial L knee joint line, L lateral hip musculature  LOWER EXTREMITY ROM:  Active ROM Right eval Left eval  Hip flexion    Hip extension    Hip abduction    Hip adduction    Knee flexion [facility] WFL  Knee extension WFL Lacking 10   (Blank rows = not tested)  LOWER EXTREMITY MMT:  MMT Right eval Left eval  Hip flexion 3+/5   Hip extension    Hip abduction    Hip adduction    Hip internal rotation    Hip external rotation    Knee flexion    Knee extension    Ankle dorsiflexion    Ankle plantarflexion    Ankle inversion    Ankle eversion     (Blank rows = not tested)  FUNCTIONAL TESTS:  30 Second Sit to Stand: 3 reps - UE support  GAIT: Distance walked: 26ft Assistive device utilized: Environmental consultant - 2 wheeled Level of assistance: CGA Comments: decreased trunk ext, slowed gait speed, shuffling gait   TODAY'S TREATMENT: [facility] Adult PT Treatment:                                                DATE: 08/19/2022 Therapeutic Exercise: Seated hamstring stretch x 20" L  Supine quad set x 5 - 5" hold R Supine SLR x 5 R Therapeutic Activity: Car transfer with visual demo and verbal cues for sequencing in order to improve safety and decrease pain   PATIENT EDUCATION:  Education details: eval findings, KOOS, HEP, POC Person educated: Patient and family Education method: Explanation, Demonstration, and Handouts Education comprehension: verbalized understanding and returned demonstration   HOME EXERCISE PROGRAM: Access Code: [email or web address] URL: [email or web address] Date: 08/19/2022 Prepared by: Edwinna Areola  Exercises - Seated Hamstring Stretch  - 1 x daily - 7 x weekly - 2-3 sets - 20 sec hold - Long Sitting Quad Set with Towel Roll Under Heel  - 1 x daily - 7 x weekly - 2 sets - 10 reps - 5 sec hold - Active Straight Leg Raise with Quad Set  - 1 x daily - 7 x weekly - 2 sets - 10  reps  ASSESSMENT:  CLINICAL IMPRESSION: Patient is a 88 y.o. F who was seen today for physical therapy evaluation and treatment for chronic L knee pain. Physical findings are consistnet with MD impression, as pt demonstrates significant decrease in LE strength and functional mobility. She would benefit from skilled PT services working on improivng LE strength and mobility in order to decrease pain and improve function/safety.    OBJECTIVE IMPAIRMENTS Abnormal gait, decreased activity tolerance, decreased balance, decreased endurance, decreased mobility, difficulty walking, decreased ROM, decreased strength, postural dysfunction, and pain.   ACTIVITY LIMITATIONS lifting, bending, standing, squatting, stairs, and transfers  PARTICIPATION LIMITATIONS: cleaning, driving, and community activity  PERSONAL FACTORS Past/current experiences,  Time since onset of injury/illness/exacerbation, and 3+ comorbidities: HTN, CVA, CHF, Osteopenia   are also affecting patient's functional outcome.   REHAB POTENTIAL: Good  CLINICAL DECISION MAKING: Stable/uncomplicated  EVALUATION COMPLEXITY: Low   GOALS: Goals reviewed with patient? No  SHORT TERM GOALS: Target date: 09/10/2022  Pt will be compliant and knowledgeable with initial HEP for improved comfort and carryover Baseline: initial HEP given Goal status: INITIAL  2.  Pt will self report L LE pain no greater than 6/10 for improved comfort and functional ability Baseline: 10/10 at worst Goal status: INITIAL   LONG TERM GOALS: Target date: 10/15/2022   Pt will self report L LE pain no greater than 3/10 for improved comfort and functional ability Baseline: 10/10 at worst Goal status: INITIAL   2.  Pt will increase 30 Second Sit to Stand rep count to no less than 6 reps for improved balance, strength, and functional mobility Baseline: 3 reps  Goal status: INITIAL   3.  Pt will decrease KOOS (short form) disability score by MDC (6.1) as proxy  for functional improvement Baseline: will assess next session Goal status: INITIAL  4.  Pt will be able to perform car transfer independently with no increase in pain for improved comfort and functional ability Baseline: unable  Goal status: INITIAL  PLAN: PT FREQUENCY: 1-2x/week  PT DURATION: 8 weeks  PLANNED INTERVENTIONS: Therapeutic exercises, Therapeutic activity, Neuromuscular re-education, Balance training, Gait training, Patient/Family education, Self Care, Joint mobilization, Aquatic Therapy, Dry Needling, Electrical stimulation, Cryotherapy, Moist heat, Vasopneumatic device, Manual therapy, and Re-evaluation  PLAN FOR NEXT SESSION: assess HEP response, L knee stretching, proximal hip and quad strengthening    Eloy End, PT 08/20/2022, 8:57 AM

## 2022-08-20 NOTE — Therapy (Incomplete)
OUTPATIENT PHYSICAL THERAPY TREATMENT NOTE   Patient Name: [name] [name] MRN: [id number] DOB:Sep 06, 1930, 89 y.o., female Today's Date: 08/21/2022  PCP: Lenoria Chime, MD REFERRING PROVIDER: Lenoria Chime, MD  END OF SESSION:   PT End of Session - 08/21/22 1216     Visit Number 2    Number of Visits 17    Date for PT Re-Evaluation 10/15/22    Authorization Type Medicare    PT Start Time 1215    PT Stop Time 1255    PT Time Calculation (min) 40 min    Activity Tolerance Patient tolerated treatment well;Patient limited by pain    Behavior During Therapy [facility] for tasks assessed/performed             Past Medical History:  Diagnosis Date   Anxiety    Diabetes ([place]) 10/27/2013   DVT (deep venous thrombosis) (HCC)    Essential tremor 10/27/2013   High cholesterol 1999   Hypertension    Ischemic stroke ([place]) 07/18/2014   Obesity, unspecified 10/27/2013   Pneumonia    Unspecified hereditary and idiopathic peripheral neuropathy 10/27/2013   Past Surgical History:  Procedure Laterality Date   CATARACT EXTRACTION Bilateral    FOOT SURGERY     IR KYPHO LUMBAR INC FX REDUCE BONE BX UNI/BIL CANNULATION INC/IMAGING  06/15/2019   Patient Active Problem List   Diagnosis Date Noted   Chronic pain of left knee 07/26/2022   Urinary incontinence 06/16/2022   Urinary frequency 12/06/2020   History of CVA (cerebrovascular accident) 04/26/2019   CHF (congestive heart failure), NYHA class III, chronic, diastolic ([place]) 32/67/1245   Hypoxemia requiring supplemental oxygen 11/10/2018   Fatigue 11/10/2018   Osteopenia of multiple sites 11/10/2018   Back pain 10/10/2016   Seizures ([place]) 08/06/2014   Hemiplegia, unspecified, affecting dominant side 08/05/2014   Right sided weakness 08/05/2014   CVA (cerebral vascular accident) ([place]) 07/21/2014   HTN (hypertension) 07/18/2014   HLD (hyperlipidemia) 07/18/2014   Thrombocytopenia ([place]) 07/18/2014   Essential tremor 10/27/2013    Obesity, unspecified 10/27/2013   History of peripheral neuropathy 10/27/2013    REFERRING DIAG: M25.562,G89.29 (ICD-10-CM) - Chronic pain of left knee  THERAPY DIAG:  Chronic pain of left knee  Muscle weakness (generalized)  Other abnormalities of gait and mobility  Abnormal posture  Rationale for Evaluation and Treatment Rehabilitation  PERTINENT HISTORY: HTN, CVA, CHF, Osteopenia   PRECAUTIONS: Fall  SUBJECTIVE: Patient reports high levels of L knee pain today.   PAIN:  Are you having pain?  Yes: NPRS scale: 9/10 (10/10 at worst) Pain location: L hip, L knee Pain description: sharp, tight Aggravating factors: transfers, walking Relieving factors: rest   OBJECTIVE: (objective measures completed at initial evaluation unless otherwise dated)   DIAGNOSTIC FINDINGS:            See imaging    PATIENT SURVEYS:  KOOS (short form) 08/21/22: 20/28 71% disability   COGNITION:           Overall cognitive status: Within functional limits for tasks assessed                          SENSATION: WFL   POSTURE: rounded shoulders, forward head, increased thoracic kyphosis, and medium body habitus   PALPATION: TTP to distal L lateral hamstring tendon, medial L knee joint line, L lateral hip musculature   LOWER EXTREMITY ROM:   Active ROM Right eval Left eval  Hip flexion  Hip extension      Hip abduction      Hip adduction      Knee flexion [facility] WFL  Knee extension [facility] Lacking 10   (Blank rows = not tested)   LOWER EXTREMITY MMT:   MMT Right eval Left eval  Hip flexion 3+/5    Hip extension      Hip abduction      Hip adduction      Hip internal rotation      Hip external rotation      Knee flexion      Knee extension      Ankle dorsiflexion      Ankle plantarflexion      Ankle inversion      Ankle eversion       (Blank rows = not tested)   FUNCTIONAL TESTS:  30 Second Sit to Stand: 3 reps - UE support   GAIT: Distance walked: 3ft Assistive  device utilized: Environmental consultant - 2 wheeled Level of assistance: CGA Comments: decreased trunk ext, slowed gait speed, shuffling gait     TODAY'S TREATMENT: [facility] Adult PT Treatment:                                                DATE: 08/20/22 Therapeutic Exercise: Seated marching 2x10 BIL Seated hamstring stretch 2x30" L  Supine clamshell GTB 2x10 Supine marching GTB 2x10 BIL Seated hip adduction ball squeeze 2x10 LAQ 2x10 BIL Seated hamstring curl RTB 2x10 BIL STS 2x5 Rows GTB seated 2x10 Shoulder extension GTB 2x10 seated  LAQ 2# 2x10 BIL  [facility] Adult PT Treatment:                                                DATE: 08/19/2022 Therapeutic Exercise: Seated hamstring stretch x 20" L  Supine quad set x 5 - 5" hold R Supine SLR x 5 R Therapeutic Activity: Car transfer with visual demo and verbal cues for sequencing in order to improve safety and decrease pain     PATIENT EDUCATION:  Education details: eval findings, KOOS, HEP, POC Person educated: Patient and family Education method: Explanation, Demonstration, and Handouts Education comprehension: verbalized understanding and returned demonstration     HOME EXERCISE PROGRAM: Access Code: [email or web address] URL: [email or web address] Date: 08/19/2022 Prepared by: Edwinna Areola   Exercises - Seated Hamstring Stretch  - 1 x daily - 7 x weekly - 2-3 sets - 20 sec hold - Long Sitting Quad Set with Towel Roll Under Heel  - 1 x daily - 7 x weekly - 2 sets - 10 reps - 5 sec hold - Active Straight Leg Raise with Quad Set  - 1 x daily - 7 x weekly - 2 sets - 10 reps   ASSESSMENT:   CLINICAL IMPRESSION: Patient presents to PT with continued reports of severe L knee pain and reports HEP compliance. During session, patient complains of lower back pain and needed to change positions from supine to seating to help alleviate the pain. Session today focused on BIL LE strengthening, particular focus on the quads. Patient continues to benefit  from skilled PT services and should be progressed as able to improve functional independence.      OBJECTIVE IMPAIRMENTS  Abnormal gait, decreased activity tolerance, decreased balance, decreased endurance, decreased mobility, difficulty walking, decreased ROM, decreased strength, postural dysfunction, and pain.    ACTIVITY LIMITATIONS lifting, bending, standing, squatting, stairs, and transfers   PARTICIPATION LIMITATIONS: cleaning, driving, and community activity   PERSONAL FACTORS Past/current experiences, Time since onset of injury/illness/exacerbation, and 3+ comorbidities: HTN, CVA, CHF, Osteopenia   are also affecting patient's functional outcome.    REHAB POTENTIAL: Good   CLINICAL DECISION MAKING: Stable/uncomplicated   EVALUATION COMPLEXITY: Low     GOALS: Goals reviewed with patient? No   SHORT TERM GOALS: Target date: 09/10/2022  Pt will be compliant and knowledgeable with initial HEP for improved comfort and carryover Baseline: initial HEP given Goal status: INITIAL   2.  Pt will self report L LE pain no greater than 6/10 for improved comfort and functional ability Baseline: 10/10 at worst Goal status: INITIAL    LONG TERM GOALS: Target date: 10/15/2022    Pt will self report L LE pain no greater than 3/10 for improved comfort and functional ability Baseline: 10/10 at worst Goal status: INITIAL    2.  Pt will increase 30 Second Sit to Stand rep count to no less than 6 reps for improved balance, strength, and functional mobility Baseline: 3 reps  Goal status: INITIAL    3.  Pt will decrease KOOS (short form) disability score by MDC (6.1) as proxy for functional improvement Baseline: will assess next session Goal status: INITIAL   4.  Pt will be able to perform car transfer independently with no increase in pain for improved comfort and functional ability Baseline: unable  Goal status: INITIAL   PLAN: PT FREQUENCY: 1-2x/week   PT DURATION: 8 weeks    PLANNED INTERVENTIONS: Therapeutic exercises, Therapeutic activity, Neuromuscular re-education, Balance training, Gait training, Patient/Family education, Self Care, Joint mobilization, Aquatic Therapy, Dry Needling, Electrical stimulation, Cryotherapy, Moist heat, Vasopneumatic device, Manual therapy, and Re-evaluation   PLAN FOR NEXT SESSION: assess HEP response, L knee stretching, proximal hip and quad strengthening     Berta Minor, PTA 08/21/2022, 12:55 PM

## 2022-08-21 ENCOUNTER — Ambulatory Visit: Payer: Medicare Other

## 2022-08-21 DIAGNOSIS — R293 Abnormal posture: Secondary | ICD-10-CM

## 2022-08-21 DIAGNOSIS — M6281 Muscle weakness (generalized): Secondary | ICD-10-CM

## 2022-08-21 DIAGNOSIS — G8929 Other chronic pain: Secondary | ICD-10-CM

## 2022-08-21 DIAGNOSIS — M25562 Pain in left knee: Secondary | ICD-10-CM | POA: Diagnosis not present

## 2022-08-21 DIAGNOSIS — R2689 Other abnormalities of gait and mobility: Secondary | ICD-10-CM

## 2022-08-27 NOTE — Therapy (Signed)
OUTPATIENT PHYSICAL THERAPY TREATMENT NOTE   Patient Name: [name] [name] MRN: [id number] DOB:11-20-1930, 88 y.o., female Today's Date: 08/28/2022  PCP: Lenoria Chime, MD REFERRING PROVIDER: Lenoria Chime, MD  END OF SESSION:   PT End of Session - 08/28/22 1213     Visit Number 3    Number of Visits 17    Date for PT Re-Evaluation 10/15/22    Authorization Type Medicare    PT Start Time 1215    PT Stop Time 7829    PT Time Calculation (min) 40 min    Activity Tolerance Patient tolerated treatment well;Patient limited by pain    Behavior During Therapy [facility] for tasks assessed/performed              Past Medical History:  Diagnosis Date   Anxiety    Diabetes ([place]) 10/27/2013   DVT (deep venous thrombosis) (HCC)    Essential tremor 10/27/2013   High cholesterol 1999   Hypertension    Ischemic stroke ([place]) 07/18/2014   Obesity, unspecified 10/27/2013   Pneumonia    Unspecified hereditary and idiopathic peripheral neuropathy 10/27/2013   Past Surgical History:  Procedure Laterality Date   CATARACT EXTRACTION Bilateral    FOOT SURGERY     IR KYPHO LUMBAR INC FX REDUCE BONE BX UNI/BIL CANNULATION INC/IMAGING  06/15/2019   Patient Active Problem List   Diagnosis Date Noted   Chronic pain of left knee 07/26/2022   Urinary incontinence 06/16/2022   Urinary frequency 12/06/2020   History of CVA (cerebrovascular accident) 04/26/2019   CHF (congestive heart failure), NYHA class III, chronic, diastolic ([place]) 56/21/3086   Hypoxemia requiring supplemental oxygen 11/10/2018   Fatigue 11/10/2018   Osteopenia of multiple sites 11/10/2018   Back pain 10/10/2016   Seizures ([place]) 08/06/2014   Hemiplegia, unspecified, affecting dominant side 08/05/2014   Right sided weakness 08/05/2014   CVA (cerebral vascular accident) ([place]) 07/21/2014   HTN (hypertension) 07/18/2014   HLD (hyperlipidemia) 07/18/2014   Thrombocytopenia ([place]) 07/18/2014   Essential tremor 10/27/2013    Obesity, unspecified 10/27/2013   History of peripheral neuropathy 10/27/2013    REFERRING DIAG: M25.562,G89.29 (ICD-10-CM) - Chronic pain of left knee  THERAPY DIAG:  Chronic pain of left knee  Muscle weakness (generalized)  Other abnormalities of gait and mobility  Rationale for Evaluation and Treatment Rehabilitation  PERTINENT HISTORY: HTN, CVA, CHF, Osteopenia   PRECAUTIONS: Fall  SUBJECTIVE: Pt presents to PT with reports that she is feeling a little bit better. She has been compliant with HEP with no adverse effect. Pt is ready to begin PT at this time.   PAIN:  Are you having pain?  Yes: NPRS scale: 9/10 (10/10 at worst) Pain location: L hip, L knee Pain description: sharp, tight Aggravating factors: transfers, walking Relieving factors: rest   OBJECTIVE: (objective measures completed at initial evaluation unless otherwise dated)   DIAGNOSTIC FINDINGS:            See imaging    PATIENT SURVEYS:  KOOS (short form) 08/21/22: 20/28 71% disability   COGNITION:           Overall cognitive status: Within functional limits for tasks assessed                          SENSATION: WFL   POSTURE: rounded shoulders, forward head, increased thoracic kyphosis, and medium body habitus   PALPATION: TTP to distal L lateral hamstring tendon, medial L knee joint  line, L lateral hip musculature   LOWER EXTREMITY ROM:   Active ROM Right eval Left eval  Hip flexion      Hip extension      Hip abduction      Hip adduction      Knee flexion [facility] WFL  Knee extension WFL Lacking 10   (Blank rows = not tested)   LOWER EXTREMITY MMT:   MMT Right eval Left eval  Hip flexion 3+/5    Hip extension      Hip abduction      Hip adduction      Hip internal rotation      Hip external rotation      Knee flexion      Knee extension      Ankle dorsiflexion      Ankle plantarflexion      Ankle inversion      Ankle eversion       (Blank rows = not tested)   FUNCTIONAL  TESTS:  30 Second Sit to Stand: 3 reps - UE support   GAIT: Distance walked: 84ft Assistive device utilized: Environmental consultant - 2 wheeled Level of assistance: CGA Comments: decreased trunk ext, slowed gait speed, shuffling gait     TODAY'S TREATMENT: [facility] Adult PT Treatment:                                                DATE: 08/28/2022 Therapeutic Exercise: LAQ 2x10 each Seated clamshell 3x15 GTB Supine SLR 2x10 each Supine ball squeeze 2x10 - 5" hold Supine heel slide 2x10 each STS 2x5  Seated horizontal abd 2x10 GTB Seated march 3x20 3# Seated hip abd butterfly 2x10  [facility] Adult PT Treatment:                                                DATE: 08/20/2022 Therapeutic Exercise: Seated marching 2x10 BIL Seated hamstring stretch 2x30" L  Supine clamshell GTB 2x10 Supine marching GTB 2x10 BIL Seated hip adduction ball squeeze 2x10 LAQ 2x10 BIL Seated hamstring curl RTB 2x10 BIL STS 2x5 Rows GTB seated 2x10 Shoulder extension GTB 2x10 seated  LAQ 2# 2x10 BIL  [facility] Adult PT Treatment:                                                DATE: 08/19/2022 Therapeutic Exercise: Seated hamstring stretch x 20" L  Supine quad set x 5 - 5" hold R Supine SLR x 5 R Therapeutic Activity: Car transfer with visual demo and verbal cues for sequencing in order to improve safety and decrease pain     PATIENT EDUCATION:  Education details: continue HEP Person educated: Patient and family Education method: Explanation, Demonstration, and Handouts Education comprehension: verbalized understanding and returned demonstration     HOME EXERCISE PROGRAM: Access Code: [email or web address] URL: [email or web address] Date: 08/19/2022 Prepared by: Octavio Manns   Exercises - Seated Hamstring Stretch  - 1 x daily - 7 x weekly - 2-3 sets - 20 sec hold - Long Sitting Quad Set with Towel Roll Under Heel  - 1 x daily -  7 x weekly - 2 sets - 10 reps - 5 sec hold - Active Straight Leg Raise with Quad Set  - 1  x daily - 7 x weekly - 2 sets - 10 reps   ASSESSMENT:   CLINICAL IMPRESSION: Pt was able to complete all prescribed exercises with no adverse effect. Therapy focused on improving LE strength and functional mobility. She does demonstrate improving L LE mobility. Will continue to progress as able per POC.      OBJECTIVE IMPAIRMENTS Abnormal gait, decreased activity tolerance, decreased balance, decreased endurance, decreased mobility, difficulty walking, decreased ROM, decreased strength, postural dysfunction, and pain.    ACTIVITY LIMITATIONS lifting, bending, standing, squatting, stairs, and transfers   PARTICIPATION LIMITATIONS: cleaning, driving, and community activity   PERSONAL FACTORS Past/current experiences, Time since onset of injury/illness/exacerbation, and 3+ comorbidities: HTN, CVA, CHF, Osteopenia   are also affecting patient's functional outcome.      GOALS: Goals reviewed with patient? No   SHORT TERM GOALS: Target date: 09/10/2022  Pt will be compliant and knowledgeable with initial HEP for improved comfort and carryover Baseline: initial HEP given Goal status: INITIAL   2.  Pt will self report L LE pain no greater than 6/10 for improved comfort and functional ability Baseline: 10/10 at worst Goal status: INITIAL    LONG TERM GOALS: Target date: 10/15/2022    Pt will self report L LE pain no greater than 3/10 for improved comfort and functional ability Baseline: 10/10 at worst Goal status: INITIAL    2.  Pt will increase 30 Second Sit to Stand rep count to no less than 6 reps for improved balance, strength, and functional mobility Baseline: 3 reps  Goal status: INITIAL    3.  Pt will decrease KOOS (short form) disability score by MDC (6.1) as proxy for functional improvement Baseline: will assess next session Goal status: INITIAL   4.  Pt will be able to perform car transfer independently with no increase in pain for improved comfort and functional  ability Baseline: unable  Goal status: INITIAL   PLAN: PT FREQUENCY: 1-2x/week   PT DURATION: 8 weeks   PLANNED INTERVENTIONS: Therapeutic exercises, Therapeutic activity, Neuromuscular re-education, Balance training, Gait training, Patient/Family education, Self Care, Joint mobilization, Aquatic Therapy, Dry Needling, Electrical stimulation, Cryotherapy, Moist heat, Vasopneumatic device, Manual therapy, and Re-evaluation   PLAN FOR NEXT SESSION: assess HEP response, L knee stretching, proximal hip and quad strengthening     Ward Chatters, PT 08/28/2022, 2:05 PM

## 2022-08-28 ENCOUNTER — Ambulatory Visit: Payer: Medicare Other | Attending: Family Medicine

## 2022-08-28 DIAGNOSIS — R293 Abnormal posture: Secondary | ICD-10-CM | POA: Diagnosis present

## 2022-08-28 DIAGNOSIS — G8929 Other chronic pain: Secondary | ICD-10-CM | POA: Insufficient documentation

## 2022-08-28 DIAGNOSIS — M25562 Pain in left knee: Secondary | ICD-10-CM | POA: Insufficient documentation

## 2022-08-28 DIAGNOSIS — M6281 Muscle weakness (generalized): Secondary | ICD-10-CM | POA: Insufficient documentation

## 2022-08-28 DIAGNOSIS — R2689 Other abnormalities of gait and mobility: Secondary | ICD-10-CM | POA: Insufficient documentation

## 2022-09-02 ENCOUNTER — Ambulatory Visit: Payer: Medicare Other

## 2022-09-02 DIAGNOSIS — R293 Abnormal posture: Secondary | ICD-10-CM

## 2022-09-02 DIAGNOSIS — G8929 Other chronic pain: Secondary | ICD-10-CM

## 2022-09-02 DIAGNOSIS — M25562 Pain in left knee: Secondary | ICD-10-CM | POA: Diagnosis not present

## 2022-09-02 DIAGNOSIS — M6281 Muscle weakness (generalized): Secondary | ICD-10-CM

## 2022-09-02 DIAGNOSIS — R2689 Other abnormalities of gait and mobility: Secondary | ICD-10-CM

## 2022-09-02 NOTE — Therapy (Signed)
OUTPATIENT PHYSICAL THERAPY TREATMENT NOTE   Patient Name: [name] [name] MRN: [id number] DOB:Apr 03, 1930, 88 y.o., female Today's Date: 09/02/2022  PCP: Lenoria Chime, MD REFERRING PROVIDER: Lenoria Chime, MD  END OF SESSION:   PT End of Session - 09/02/22 1453     Visit Number 4    Number of Visits 17    Date for PT Re-Evaluation 10/15/22    Authorization Type Medicare    PT Start Time 1400    PT Stop Time 0086    PT Time Calculation (min) 42 min    Activity Tolerance Patient tolerated treatment well;Patient limited by pain    Behavior During Therapy [facility] for tasks assessed/performed               Past Medical History:  Diagnosis Date   Anxiety    Diabetes ([place]) 10/27/2013   DVT (deep venous thrombosis) (HCC)    Essential tremor 10/27/2013   High cholesterol 1999   Hypertension    Ischemic stroke ([place]) 07/18/2014   Obesity, unspecified 10/27/2013   Pneumonia    Unspecified hereditary and idiopathic peripheral neuropathy 10/27/2013   Past Surgical History:  Procedure Laterality Date   CATARACT EXTRACTION Bilateral    FOOT SURGERY     IR KYPHO LUMBAR INC FX REDUCE BONE BX UNI/BIL CANNULATION INC/IMAGING  06/15/2019   Patient Active Problem List   Diagnosis Date Noted   Chronic pain of left knee 07/26/2022   Urinary incontinence 06/16/2022   Urinary frequency 12/06/2020   History of CVA (cerebrovascular accident) 04/26/2019   CHF (congestive heart failure), NYHA class III, chronic, diastolic ([place]) 76/19/5093   Hypoxemia requiring supplemental oxygen 11/10/2018   Fatigue 11/10/2018   Osteopenia of multiple sites 11/10/2018   Back pain 10/10/2016   Seizures ([place]) 08/06/2014   Hemiplegia, unspecified, affecting dominant side 08/05/2014   Right sided weakness 08/05/2014   CVA (cerebral vascular accident) ([place]) 07/21/2014   HTN (hypertension) 07/18/2014   HLD (hyperlipidemia) 07/18/2014   Thrombocytopenia ([place]) 07/18/2014   Essential tremor 10/27/2013    Obesity, unspecified 10/27/2013   History of peripheral neuropathy 10/27/2013    REFERRING DIAG: M25.562,G89.29 (ICD-10-CM) - Chronic pain of left knee  THERAPY DIAG:  Chronic pain of left knee  Muscle weakness (generalized)  Other abnormalities of gait and mobility  Abnormal posture  Rationale for Evaluation and Treatment Rehabilitation  PERTINENT HISTORY: HTN, CVA, CHF, Osteopenia   PRECAUTIONS: Fall  SUBJECTIVE: Pt presents to PT with reports of decreased pain, did have 9/10 the previous night. Has been compliant with HEP with no adverse effect. Pt is ready to begin PT at this time.   PAIN:  Are you having pain?  Yes: NPRS scale: 4/10 (10/10 at worst) Pain location: L hip, L knee Pain description: sharp, tight Aggravating factors: transfers, walking Relieving factors: rest   OBJECTIVE: (objective measures completed at initial evaluation unless otherwise dated)   DIAGNOSTIC FINDINGS:            See imaging    PATIENT SURVEYS:  KOOS (short form) 08/21/22: 20/28 71% disability   COGNITION:           Overall cognitive status: Within functional limits for tasks assessed                          SENSATION: WFL   POSTURE: rounded shoulders, forward head, increased thoracic kyphosis, and medium body habitus   PALPATION: TTP to distal L lateral hamstring tendon,  medial L knee joint line, L lateral hip musculature   LOWER EXTREMITY ROM:   Active ROM Right eval Left eval  Hip flexion      Hip extension      Hip abduction      Hip adduction      Knee flexion [facility] WFL  Knee extension WFL Lacking 10   (Blank rows = not tested)   LOWER EXTREMITY MMT:   MMT Right eval Left eval  Hip flexion 3+/5    Hip extension      Hip abduction      Hip adduction      Hip internal rotation      Hip external rotation      Knee flexion      Knee extension      Ankle dorsiflexion      Ankle plantarflexion      Ankle inversion      Ankle eversion       (Blank rows  = not tested)   FUNCTIONAL TESTS:  30 Second Sit to Stand: 3 reps - UE support   GAIT: Distance walked: 20ft Assistive device utilized: Environmental consultant - 2 wheeled Level of assistance: CGA Comments: decreased trunk ext, slowed gait speed, shuffling gait     TODAY'S TREATMENT: [facility] Adult PT Treatment:                                                DATE: 09/02/2022 Therapeutic Exercise: LAQ 2x10 each 2# Seated hamstring curl 2x10 GTB STS high table 2x10 Supine clamshell 3x15 GTB Supine march 2x10 GTB Supine SLR 2x10 each Supine heel slide 2x10 each Manual Therapy:  STM to L posterior hamstring and calf  [facility] Adult PT Treatment:                                                DATE: 08/28/2022 Therapeutic Exercise: LAQ 2x10 each Seated clamshell 3x15 GTB Supine SLR 2x10 each Supine ball squeeze 2x10 - 5" hold Supine heel slide 2x10 each STS 2x5  Seated horizontal abd 2x10 GTB Seated march 3x20 3# Seated hip abd butterfly 2x10  [facility] Adult PT Treatment:                                                DATE: 08/20/2022 Therapeutic Exercise: Seated marching 2x10 BIL Seated hamstring stretch 2x30" L  Supine clamshell GTB 2x10 Supine marching GTB 2x10 BIL Seated hip adduction ball squeeze 2x10 LAQ 2x10 BIL Seated hamstring curl RTB 2x10 BIL STS 2x5 Rows GTB seated 2x10 Shoulder extension GTB 2x10 seated  LAQ 2# 2x10 BIL  [facility] Adult PT Treatment:                                                DATE: 08/19/2022 Therapeutic Exercise: Seated hamstring stretch x 20" L  Supine quad set x 5 - 5" hold R Supine SLR x 5 R Therapeutic Activity: Car transfer with visual  demo and verbal cues for sequencing in order to improve safety and decrease pain     PATIENT EDUCATION:  Education details: continue HEP Person educated: Patient and family Education method: Explanation, Demonstration, and Handouts Education comprehension: verbalized understanding and returned demonstration     HOME  EXERCISE PROGRAM: Access Code: [email or web address] URL: [email or web address] Date: 08/19/2022 Prepared by: Edwinna Areola   Exercises - Seated Hamstring Stretch  - 1 x daily - 7 x weekly - 2-3 sets - 20 sec hold - Long Sitting Quad Set with Towel Roll Under Heel  - 1 x daily - 7 x weekly - 2 sets - 10 reps - 5 sec hold - Active Straight Leg Raise with Quad Set  - 1 x daily - 7 x weekly - 2 sets - 10 reps   ASSESSMENT:   CLINICAL IMPRESSION: Pt was able to complete prescribed exercises with no adverse effect, did have slight increase in pain to L posterior hip. She responded well to manual therapy interventions noting slight decrease in pain. She continues to benefit from skilled PT, will progress as able.      OBJECTIVE IMPAIRMENTS Abnormal gait, decreased activity tolerance, decreased balance, decreased endurance, decreased mobility, difficulty walking, decreased ROM, decreased strength, postural dysfunction, and pain.    ACTIVITY LIMITATIONS lifting, bending, standing, squatting, stairs, and transfers   PARTICIPATION LIMITATIONS: cleaning, driving, and community activity   PERSONAL FACTORS Past/current experiences, Time since onset of injury/illness/exacerbation, and 3+ comorbidities: HTN, CVA, CHF, Osteopenia   are also affecting patient's functional outcome.      GOALS: Goals reviewed with patient? No   SHORT TERM GOALS: Target date: 09/10/2022  Pt will be compliant and knowledgeable with initial HEP for improved comfort and carryover Baseline: initial HEP given Goal status: INITIAL   2.  Pt will self report L LE pain no greater than 6/10 for improved comfort and functional ability Baseline: 10/10 at worst Goal status: INITIAL    LONG TERM GOALS: Target date: 10/15/2022    Pt will self report L LE pain no greater than 3/10 for improved comfort and functional ability Baseline: 10/10 at worst Goal status: INITIAL    2.  Pt will increase 30 Second Sit to Stand rep count  to no less than 6 reps for improved balance, strength, and functional mobility Baseline: 3 reps  Goal status: INITIAL    3.  Pt will decrease KOOS (short form) disability score by MDC (6.1) as proxy for functional improvement Baseline: will assess next session Goal status: INITIAL   4.  Pt will be able to perform car transfer independently with no increase in pain for improved comfort and functional ability Baseline: unable  Goal status: INITIAL   PLAN: PT FREQUENCY: 1-2x/week   PT DURATION: 8 weeks   PLANNED INTERVENTIONS: Therapeutic exercises, Therapeutic activity, Neuromuscular re-education, Balance training, Gait training, Patient/Family education, Self Care, Joint mobilization, Aquatic Therapy, Dry Needling, Electrical stimulation, Cryotherapy, Moist heat, Vasopneumatic device, Manual therapy, and Re-evaluation   PLAN FOR NEXT SESSION: assess HEP response, L knee stretching, proximal hip and quad strengthening     Eloy End, PT 09/02/2022, 3:07 PM

## 2022-09-11 ENCOUNTER — Ambulatory Visit: Payer: Medicare Other

## 2022-09-13 ENCOUNTER — Other Ambulatory Visit: Payer: Self-pay | Admitting: Neurology

## 2022-09-13 ENCOUNTER — Ambulatory Visit: Payer: Medicare Other | Admitting: Physical Therapy

## 2022-09-13 DIAGNOSIS — R293 Abnormal posture: Secondary | ICD-10-CM

## 2022-09-13 DIAGNOSIS — G250 Essential tremor: Secondary | ICD-10-CM

## 2022-09-13 DIAGNOSIS — R2689 Other abnormalities of gait and mobility: Secondary | ICD-10-CM

## 2022-09-13 DIAGNOSIS — G8929 Other chronic pain: Secondary | ICD-10-CM

## 2022-09-13 DIAGNOSIS — M6281 Muscle weakness (generalized): Secondary | ICD-10-CM

## 2022-09-13 DIAGNOSIS — M25562 Pain in left knee: Secondary | ICD-10-CM | POA: Diagnosis not present

## 2022-09-13 NOTE — Therapy (Addendum)
OUTPATIENT PHYSICAL THERAPY TREATMENT NOTE DISCHARGE   Patient Name: [name] [name] MRN: [id number] DOB:Apr 11, 1930, 88 y.o., female Today's Date: 09/13/2022  PCP: Billey Co, MD REFERRING PROVIDER: Billey Co, MD  END OF SESSION:   PT End of Session - 09/13/22 1145     Visit Number 5    Number of Visits 17    Date for PT Re-Evaluation 10/15/22    Authorization Type Medicare    PT Start Time 1147    PT Stop Time 1218    PT Time Calculation (min) 31 min    Activity Tolerance Patient tolerated treatment well;Patient limited by pain    Behavior During Therapy [facility] for tasks assessed/performed                Past Medical History:  Diagnosis Date   Anxiety    Diabetes (HCC) 10/27/2013   DVT (deep venous thrombosis) (HCC)    Essential tremor 10/27/2013   High cholesterol 1999   Hypertension    Ischemic stroke (HCC) 07/18/2014   Obesity, unspecified 10/27/2013   Pneumonia    Unspecified hereditary and idiopathic peripheral neuropathy 10/27/2013   Past Surgical History:  Procedure Laterality Date   CATARACT EXTRACTION Bilateral    FOOT SURGERY     IR KYPHO LUMBAR INC FX REDUCE BONE BX UNI/BIL CANNULATION INC/IMAGING  06/15/2019   Patient Active Problem List   Diagnosis Date Noted   Chronic pain of left knee 07/26/2022   Urinary incontinence 06/16/2022   Urinary frequency 12/06/2020   History of CVA (cerebrovascular accident) 04/26/2019   CHF (congestive heart failure), NYHA class III, chronic, diastolic (HCC) 11/11/2018   Hypoxemia requiring supplemental oxygen 11/10/2018   Fatigue 11/10/2018   Osteopenia of multiple sites 11/10/2018   Back pain 10/10/2016   Seizures (HCC) 08/06/2014   Hemiplegia, unspecified, affecting dominant side 08/05/2014   Right sided weakness 08/05/2014   CVA (cerebral vascular accident) (HCC) 07/21/2014   HTN (hypertension) 07/18/2014   HLD (hyperlipidemia) 07/18/2014   Thrombocytopenia (HCC) 07/18/2014   Essential  tremor 10/27/2013   Obesity, unspecified 10/27/2013   History of peripheral neuropathy 10/27/2013    REFERRING DIAG: M25.562,G89.29 (ICD-10-CM) - Chronic pain of left knee  THERAPY DIAG:  Chronic pain of left knee  Muscle weakness (generalized)  Other abnormalities of gait and mobility  Abnormal posture  Rationale for Evaluation and Treatment Rehabilitation  PERTINENT HISTORY: HTN, CVA, CHF, Osteopenia   PRECAUTIONS: Fall  SUBJECTIVE: Her knee locked after her last visit.  She had a fall the other day. She has not been able to walk for several days.  She uses a brace sometimes. Pain is up today.   PAIN:  Are you having pain?  Yes: NPRS scale: 8/10  Pain location: L low back L hip, L post knee Pain description: sharp, tight Aggravating factors: transfers, walking Relieving factors: rest   OBJECTIVE: (objective measures completed at initial evaluation unless otherwise dated)   DIAGNOSTIC FINDINGS:            See imaging    PATIENT SURVEYS:  KOOS (short form) 08/21/22: 20/28 71% disability   COGNITION:           Overall cognitive status: Within functional limits for tasks assessed                          SENSATION: WFL   POSTURE: rounded shoulders, forward head, increased thoracic kyphosis, and medium body habitus   PALPATION: TTP  to distal L lateral hamstring tendon, medial L knee joint line, L lateral hip musculature   LOWER EXTREMITY ROM:   Active ROM Right eval Left eval  Hip flexion      Hip extension      Hip abduction      Hip adduction      Knee flexion [facility]  Knee extension [facility] Lacking 10   (Blank rows = not tested)   LOWER EXTREMITY MMT:   MMT Right eval Left 09/13/22  Hip flexion 3+/5    Hip extension      Hip abduction      Hip adduction      Hip internal rotation      Hip external rotation      Knee flexion   4+ pain    Knee extension   4+ pain    Ankle dorsiflexion      Ankle plantarflexion      Ankle inversion      Ankle  eversion       (Blank rows = not tested)   FUNCTIONAL TESTS:  30 Second Sit to Stand: 3 reps - UE support   GAIT: Distance walked: 40ft Assistive device utilized: Environmental consultant - 2 wheeled Level of assistance: CGA Comments: decreased trunk ext, slowed gait speed, shuffling gait     TODAY'S TREATMENT:   [facility] Adult PT Treatment:                                                DATE: 09/13/22 Therapeutic Exercise: LAQ x 20 each LE SAQ x 20 Lt LE  Hamstring stretching by PT assisting  Lower trunk rotation  Sit to stand min A unable to tolerate due to pain and feeling weak Rolling to side Self Care: Symptom intake, recommendation for follow up with MD Back pain as source of L post LE pain  Position with legs propped on bolster  [facility] Adult PT Treatment:                                                DATE: 09/02/2022 Therapeutic Exercise: LAQ 2x10 each 2# Seated hamstring curl 2x10 GTB STS high table 2x10 Supine clamshell 3x15 GTB Supine march 2x10 GTB Supine SLR 2x10 each Supine heel slide 2x10 each Manual Therapy:  STM to L posterior hamstring and calf  [facility] Adult PT Treatment:                                                DATE: 08/28/2022 Therapeutic Exercise: LAQ 2x10 each Seated clamshell 3x15 GTB Supine SLR 2x10 each Supine ball squeeze 2x10 - 5" hold Supine heel slide 2x10 each STS 2x5  Seated horizontal abd 2x10 GTB Seated march 3x20 3# Seated hip abd butterfly 2x10  [facility] Adult PT Treatment:                                                DATE: 08/20/2022 Therapeutic Exercise: Seated  marching 2x10 BIL Seated hamstring stretch 2x30" L  Supine clamshell GTB 2x10 Supine marching GTB 2x10 BIL Seated hip adduction ball squeeze 2x10 LAQ 2x10 BIL Seated hamstring curl RTB 2x10 BIL STS 2x5 Rows GTB seated 2x10 Shoulder extension GTB 2x10 seated  LAQ 2# 2x10 BIL  [facility] Adult PT Treatment:                                                DATE: 08/19/2022 Therapeutic  Exercise: Seated hamstring stretch x 20" L  Supine quad set x 5 - 5" hold R Supine SLR x 5 R Therapeutic Activity: Car transfer with visual demo and verbal cues for sequencing in order to improve safety and decrease pain     PATIENT EDUCATION:  Education details: continue HEP Person educated: Patient and family Education method: Explanation, Demonstration, and Handouts Education comprehension: verbalized understanding and returned demonstration     HOME EXERCISE PROGRAM: Access Code: [email or web address] URL: [email or web address] Date: 08/19/2022 Prepared by: Edwinna Areola   Exercises - Seated Hamstring Stretch  - 1 x daily - 7 x weekly - 2-3 sets - 20 sec hold - Long Sitting Quad Set with Towel Roll Under Heel  - 1 x daily - 7 x weekly - 2 sets - 10 reps - 5 sec hold - Active Straight Leg Raise with Quad Set  - 1 x daily - 7 x weekly - 2 sets - 10 reps   ASSESSMENT:   CLINICAL IMPRESSION: Patient has had increased LLE pain since last visit.  She fell and her leg was bent up under her. She was unable to tolerate full session today due to severe back and LLE pain . Opted to hold PT for today and patient's son will call Dr. Wynelle Link today to see if she can get in sooner.  Her lumbar spine seems to be the source of her pain vs the knee itself.  Will still keep next appt here for now unless things change.     OBJECTIVE IMPAIRMENTS Abnormal gait, decreased activity tolerance, decreased balance, decreased endurance, decreased mobility, difficulty walking, decreased ROM, decreased strength, postural dysfunction, and pain.    ACTIVITY LIMITATIONS lifting, bending, standing, squatting, stairs, and transfers   PARTICIPATION LIMITATIONS: cleaning, driving, and community activity   PERSONAL FACTORS Past/current experiences, Time since onset of injury/illness/exacerbation, and 3+ comorbidities: HTN, CVA, CHF, Osteopenia   are also affecting patient's functional outcome.      GOALS: Goals  reviewed with patient? No   SHORT TERM GOALS: Target date: 09/10/2022  Pt will be compliant and knowledgeable with initial HEP for improved comfort and carryover Baseline: initial HEP given Goal status: INITIAL   2.  Pt will self report L LE pain no greater than 6/10 for improved comfort and functional ability Baseline: 10/10 at worst Goal status: INITIAL    LONG TERM GOALS: Target date: 10/15/2022    Pt will self report L LE pain no greater than 3/10 for improved comfort and functional ability Baseline: 10/10 at worst Goal status: INITIAL    2.  Pt will increase 30 Second Sit to Stand rep count to no less than 6 reps for improved balance, strength, and functional mobility Baseline: 3 reps  Goal status: INITIAL    3.  Pt will decrease KOOS (short form) disability score by MDC (6.1) as proxy for functional improvement Baseline: will  assess next session Goal status: INITIAL   4.  Pt will be able to perform car transfer independently with no increase in pain for improved comfort and functional ability Baseline: unable  Goal status: INITIAL   PLAN: PT FREQUENCY: 1-2x/week   PT DURATION: 8 weeks   PLANNED INTERVENTIONS: Therapeutic exercises, Therapeutic activity, Neuromuscular re-education, Balance training, Gait training, Patient/Family education, Self Care, Joint mobilization, Aquatic Therapy, Dry Needling, Electrical stimulation, Cryotherapy, Moist heat, Vasopneumatic device, Manual therapy, and Re-evaluation   PLAN FOR NEXT SESSION: MD follow up? Back vs knee pain   assess HEP response, L knee stretching, proximal hip and quad strengthening     Anesa Fronek, PT 09/13/2022, 12:20 PM  Karie Mainland, PT 09/13/22 12:28 PM Phone: [phone] Fax: [phone]   PHYSICAL THERAPY DISCHARGE SUMMARY  Visits from Start of Care: 5  Current functional level related to goals / functional outcomes: See above, unknown specifics   Remaining deficits: Unknown specifics     Education / Equipment: HEP, safety with gait, posture    Patient agrees to discharge. Patient goals were partially met. Patient is being discharged due to a change in medical status.  Karie Mainland, PT 11/19/22 11:33 AM Phone: [phone] Fax: [phone]

## 2022-09-16 ENCOUNTER — Ambulatory Visit (INDEPENDENT_AMBULATORY_CARE_PROVIDER_SITE_OTHER): Payer: Medicare Other

## 2022-09-16 ENCOUNTER — Ambulatory Visit: Payer: Medicare Other

## 2022-09-16 ENCOUNTER — Encounter (HOSPITAL_COMMUNITY): Payer: Self-pay | Admitting: Emergency Medicine

## 2022-09-16 ENCOUNTER — Encounter (HOSPITAL_COMMUNITY): Payer: Self-pay

## 2022-09-16 ENCOUNTER — Inpatient Hospital Stay (HOSPITAL_COMMUNITY)
Admission: EM | Admit: 2022-09-16 | Discharge: 2022-10-04 | DRG: 291 | Disposition: A | Discharge status: Rehabilitation Facility | Payer: Medicare Other | Attending: Family Medicine | Admitting: Family Medicine

## 2022-09-16 ENCOUNTER — Other Ambulatory Visit: Payer: Self-pay

## 2022-09-16 ENCOUNTER — Ambulatory Visit (HOSPITAL_COMMUNITY)
Admission: EM | Admit: 2022-09-16 | Discharge: 2022-09-16 | Disposition: A | Discharge status: Home / Self Care | Payer: Medicare Other | Attending: Internal Medicine | Admitting: Internal Medicine

## 2022-09-16 ENCOUNTER — Ambulatory Visit: Payer: Medicare Other | Admitting: Family Medicine

## 2022-09-16 DIAGNOSIS — Z823 Family history of stroke: Secondary | ICD-10-CM

## 2022-09-16 DIAGNOSIS — D62 Acute posthemorrhagic anemia: Secondary | ICD-10-CM | POA: Diagnosis not present

## 2022-09-16 DIAGNOSIS — I110 Hypertensive heart disease with heart failure: Principal | ICD-10-CM | POA: Diagnosis present

## 2022-09-16 DIAGNOSIS — M545 Low back pain, unspecified: Secondary | ICD-10-CM

## 2022-09-16 DIAGNOSIS — R0602 Shortness of breath: Secondary | ICD-10-CM

## 2022-09-16 DIAGNOSIS — R059 Cough, unspecified: Secondary | ICD-10-CM

## 2022-09-16 DIAGNOSIS — R54 Age-related physical debility: Secondary | ICD-10-CM | POA: Diagnosis present

## 2022-09-16 DIAGNOSIS — I2699 Other pulmonary embolism without acute cor pulmonale: Secondary | ICD-10-CM | POA: Diagnosis present

## 2022-09-16 DIAGNOSIS — Z9841 Cataract extraction status, right eye: Secondary | ICD-10-CM

## 2022-09-16 DIAGNOSIS — Z515 Encounter for palliative care: Secondary | ICD-10-CM

## 2022-09-16 DIAGNOSIS — M7989 Other specified soft tissue disorders: Secondary | ICD-10-CM

## 2022-09-16 DIAGNOSIS — M5441 Lumbago with sciatica, right side: Secondary | ICD-10-CM | POA: Diagnosis not present

## 2022-09-16 DIAGNOSIS — E876 Hypokalemia: Secondary | ICD-10-CM | POA: Diagnosis present

## 2022-09-16 DIAGNOSIS — M79605 Pain in left leg: Secondary | ICD-10-CM | POA: Diagnosis not present

## 2022-09-16 DIAGNOSIS — B952 Enterococcus as the cause of diseases classified elsewhere: Secondary | ICD-10-CM | POA: Diagnosis not present

## 2022-09-16 DIAGNOSIS — R002 Palpitations: Secondary | ICD-10-CM | POA: Diagnosis not present

## 2022-09-16 DIAGNOSIS — D649 Anemia, unspecified: Secondary | ICD-10-CM

## 2022-09-16 DIAGNOSIS — E7800 Pure hypercholesterolemia, unspecified: Secondary | ICD-10-CM | POA: Diagnosis present

## 2022-09-16 DIAGNOSIS — G20A1 Parkinson's disease without dyskinesia, without mention of fluctuations: Secondary | ICD-10-CM | POA: Diagnosis present

## 2022-09-16 DIAGNOSIS — I82441 Acute embolism and thrombosis of right tibial vein: Secondary | ICD-10-CM | POA: Diagnosis not present

## 2022-09-16 DIAGNOSIS — J90 Pleural effusion, not elsewhere classified: Secondary | ICD-10-CM | POA: Diagnosis not present

## 2022-09-16 DIAGNOSIS — N390 Urinary tract infection, site not specified: Secondary | ICD-10-CM | POA: Diagnosis not present

## 2022-09-16 DIAGNOSIS — Z1152 Encounter for screening for COVID-19: Secondary | ICD-10-CM

## 2022-09-16 DIAGNOSIS — R7401 Elevation of levels of liver transaminase levels: Secondary | ICD-10-CM | POA: Diagnosis not present

## 2022-09-16 DIAGNOSIS — R3129 Other microscopic hematuria: Secondary | ICD-10-CM | POA: Diagnosis not present

## 2022-09-16 DIAGNOSIS — E785 Hyperlipidemia, unspecified: Secondary | ICD-10-CM | POA: Diagnosis present

## 2022-09-16 DIAGNOSIS — E1140 Type 2 diabetes mellitus with diabetic neuropathy, unspecified: Secondary | ICD-10-CM | POA: Diagnosis present

## 2022-09-16 DIAGNOSIS — M5137 Other intervertebral disc degeneration, lumbosacral region: Secondary | ICD-10-CM | POA: Diagnosis present

## 2022-09-16 DIAGNOSIS — M5432 Sciatica, left side: Secondary | ICD-10-CM

## 2022-09-16 DIAGNOSIS — I82611 Acute embolism and thrombosis of superficial veins of right upper extremity: Secondary | ICD-10-CM | POA: Diagnosis not present

## 2022-09-16 DIAGNOSIS — Z79899 Other long term (current) drug therapy: Secondary | ICD-10-CM

## 2022-09-16 DIAGNOSIS — J9811 Atelectasis: Secondary | ICD-10-CM | POA: Diagnosis present

## 2022-09-16 DIAGNOSIS — Z66 Do not resuscitate: Secondary | ICD-10-CM | POA: Diagnosis present

## 2022-09-16 DIAGNOSIS — I480 Paroxysmal atrial fibrillation: Secondary | ICD-10-CM | POA: Diagnosis present

## 2022-09-16 DIAGNOSIS — G40909 Epilepsy, unspecified, not intractable, without status epilepticus: Secondary | ICD-10-CM | POA: Diagnosis present

## 2022-09-16 DIAGNOSIS — R82998 Other abnormal findings in urine: Secondary | ICD-10-CM

## 2022-09-16 DIAGNOSIS — K254 Chronic or unspecified gastric ulcer with hemorrhage: Secondary | ICD-10-CM | POA: Diagnosis present

## 2022-09-16 DIAGNOSIS — B9620 Unspecified Escherichia coli [E. coli] as the cause of diseases classified elsewhere: Secondary | ICD-10-CM | POA: Diagnosis not present

## 2022-09-16 DIAGNOSIS — D696 Thrombocytopenia, unspecified: Secondary | ICD-10-CM | POA: Diagnosis present

## 2022-09-16 DIAGNOSIS — R109 Unspecified abdominal pain: Secondary | ICD-10-CM

## 2022-09-16 DIAGNOSIS — F419 Anxiety disorder, unspecified: Secondary | ICD-10-CM | POA: Diagnosis present

## 2022-09-16 DIAGNOSIS — I4891 Unspecified atrial fibrillation: Secondary | ICD-10-CM | POA: Diagnosis present

## 2022-09-16 DIAGNOSIS — G8929 Other chronic pain: Secondary | ICD-10-CM | POA: Diagnosis present

## 2022-09-16 DIAGNOSIS — R079 Chest pain, unspecified: Secondary | ICD-10-CM

## 2022-09-16 DIAGNOSIS — J9601 Acute respiratory failure with hypoxia: Secondary | ICD-10-CM | POA: Diagnosis present

## 2022-09-16 DIAGNOSIS — Z8249 Family history of ischemic heart disease and other diseases of the circulatory system: Secondary | ICD-10-CM

## 2022-09-16 DIAGNOSIS — Z751 Person awaiting admission to adequate facility elsewhere: Secondary | ICD-10-CM

## 2022-09-16 DIAGNOSIS — I69351 Hemiplegia and hemiparesis following cerebral infarction affecting right dominant side: Secondary | ICD-10-CM

## 2022-09-16 DIAGNOSIS — M25551 Pain in right hip: Secondary | ICD-10-CM

## 2022-09-16 DIAGNOSIS — T462X5A Adverse effect of other antidysrhythmic drugs, initial encounter: Secondary | ICD-10-CM | POA: Diagnosis not present

## 2022-09-16 DIAGNOSIS — Z7401 Bed confinement status: Secondary | ICD-10-CM

## 2022-09-16 DIAGNOSIS — K259 Gastric ulcer, unspecified as acute or chronic, without hemorrhage or perforation: Secondary | ICD-10-CM

## 2022-09-16 DIAGNOSIS — I440 Atrioventricular block, first degree: Secondary | ICD-10-CM | POA: Diagnosis present

## 2022-09-16 DIAGNOSIS — Z7902 Long term (current) use of antithrombotics/antiplatelets: Secondary | ICD-10-CM

## 2022-09-16 DIAGNOSIS — D6832 Hemorrhagic disorder due to extrinsic circulating anticoagulants: Secondary | ICD-10-CM | POA: Diagnosis not present

## 2022-09-16 DIAGNOSIS — R944 Abnormal results of kidney function studies: Secondary | ICD-10-CM | POA: Diagnosis present

## 2022-09-16 DIAGNOSIS — M25552 Pain in left hip: Secondary | ICD-10-CM | POA: Diagnosis present

## 2022-09-16 DIAGNOSIS — R001 Bradycardia, unspecified: Secondary | ICD-10-CM | POA: Diagnosis not present

## 2022-09-16 DIAGNOSIS — Y92239 Unspecified place in hospital as the place of occurrence of the external cause: Secondary | ICD-10-CM | POA: Diagnosis not present

## 2022-09-16 DIAGNOSIS — Z9842 Cataract extraction status, left eye: Secondary | ICD-10-CM

## 2022-09-16 DIAGNOSIS — I5033 Acute on chronic diastolic (congestive) heart failure: Secondary | ICD-10-CM | POA: Diagnosis present

## 2022-09-16 DIAGNOSIS — K567 Ileus, unspecified: Secondary | ICD-10-CM | POA: Diagnosis not present

## 2022-09-16 DIAGNOSIS — G250 Essential tremor: Secondary | ICD-10-CM | POA: Diagnosis present

## 2022-09-16 DIAGNOSIS — I10 Essential (primary) hypertension: Secondary | ICD-10-CM | POA: Diagnosis present

## 2022-09-16 DIAGNOSIS — Z6831 Body mass index (BMI) 31.0-31.9, adult: Secondary | ICD-10-CM

## 2022-09-16 DIAGNOSIS — M47816 Spondylosis without myelopathy or radiculopathy, lumbar region: Secondary | ICD-10-CM | POA: Diagnosis present

## 2022-09-16 DIAGNOSIS — Z8669 Personal history of other diseases of the nervous system and sense organs: Secondary | ICD-10-CM

## 2022-09-16 DIAGNOSIS — E669 Obesity, unspecified: Secondary | ICD-10-CM | POA: Diagnosis present

## 2022-09-16 LAB — CBC WITH DIFFERENTIAL/PLATELET
Abs Immature Granulocytes: 0.03 10*3/uL (ref 0.00–0.07)
Basophils Absolute: 0.1 10*3/uL (ref 0.0–0.1)
Basophils Relative: 1 %
Eosinophils Absolute: 0 10*3/uL (ref 0.0–0.5)
Eosinophils Relative: 0 %
HCT: 46.1 % — ABNORMAL HIGH (ref 36.0–46.0)
Hemoglobin: 15.2 g/dL — ABNORMAL HIGH (ref 12.0–15.0)
Immature Granulocytes: 0 %
Lymphocytes Relative: 17 %
Lymphs Abs: 1.3 10*3/uL (ref 0.7–4.0)
MCH: 31.5 pg (ref 26.0–34.0)
MCHC: 33 g/dL (ref 30.0–36.0)
MCV: 95.4 fL (ref 80.0–100.0)
Monocytes Absolute: 0.6 10*3/uL (ref 0.1–1.0)
Monocytes Relative: 8 %
Neutro Abs: 5.7 10*3/uL (ref 1.7–7.7)
Neutrophils Relative %: 74 %
Platelets: 100 10*3/uL — ABNORMAL LOW (ref 150–400)
RBC: 4.83 MIL/uL (ref 3.87–5.11)
RDW: 14.9 % (ref 11.5–15.5)
WBC: 7.7 10*3/uL (ref 4.0–10.5)
nRBC: 0 % (ref 0.0–0.2)

## 2022-09-16 LAB — COMPREHENSIVE METABOLIC PANEL
ALT: 59 U/L — ABNORMAL HIGH (ref 0–44)
AST: 48 U/L — ABNORMAL HIGH (ref 15–41)
Albumin: 3.2 g/dL — ABNORMAL LOW (ref 3.5–5.0)
Alkaline Phosphatase: 110 U/L (ref 38–126)
Anion gap: 14 (ref 5–15)
BUN: 20 mg/dL (ref 8–23)
CO2: 27 mmol/L (ref 22–32)
Calcium: 8.8 mg/dL — ABNORMAL LOW (ref 8.9–10.3)
Chloride: 98 mmol/L (ref 98–111)
Creatinine, Ser: 0.97 mg/dL (ref 0.44–1.00)
GFR, Estimated: 56 mL/min — ABNORMAL LOW (ref >60)
Glucose, Bld: 114 mg/dL — ABNORMAL HIGH (ref 70–99)
Potassium: 4.4 mmol/L (ref 3.5–5.1)
Sodium: 139 mmol/L (ref 135–145)
Total Bilirubin: 0.6 mg/dL (ref 0.3–1.2)
Total Protein: 7.1 g/dL (ref 6.5–8.1)

## 2022-09-16 LAB — BRAIN NATRIURETIC PEPTIDE: B Natriuretic Peptide: 465.1 pg/mL — ABNORMAL HIGH (ref 0.0–100.0)

## 2022-09-16 LAB — TSH: TSH: 1.792 u[IU]/mL (ref 0.350–4.500)

## 2022-09-16 LAB — MAGNESIUM: Magnesium: 2 mg/dL (ref 1.7–2.4)

## 2022-09-16 LAB — TROPONIN I (HIGH SENSITIVITY): Troponin I (High Sensitivity): 6 ng/L (ref <18)

## 2022-09-16 LAB — RESP PANEL BY RT-PCR (FLU A&B, COVID) ARPGX2
Influenza A by PCR: NEGATIVE
Influenza B by PCR: NEGATIVE
SARS Coronavirus 2 by RT PCR: NEGATIVE

## 2022-09-16 MED ORDER — ALPRAZOLAM 0.25 MG PO TABS
0.2500 mg | ORAL_TABLET | Freq: Two times a day (BID) | ORAL | Status: DC | PRN
Start: 2022-09-16 — End: 2022-09-17

## 2022-09-16 MED ORDER — FUROSEMIDE 10 MG/ML IJ SOLN
40.0000 mg | Freq: Once | INTRAMUSCULAR | Status: AC
  Administered 2022-09-16: 40 mg via INTRAVENOUS
  Filled 2022-09-16: qty 4

## 2022-09-16 MED ORDER — POTASSIUM CHLORIDE CRYS ER 20 MEQ PO TBCR: 20.0000 meq | EXTENDED_RELEASE_TABLET | Freq: Every day | ORAL | Status: DC

## 2022-09-16 MED ORDER — OLOPATADINE HCL 0.7 % OP SOLN: 1.0000 [drp] | Freq: Every day | OPHTHALMIC | Status: DC

## 2022-09-16 MED ORDER — DILTIAZEM HCL-DEXTROSE 125-5 MG/125ML-% IV SOLN (PREMIX)
5.0000 mg/h | INTRAVENOUS | Status: DC
  Administered 2022-09-16: 5 mg/h via INTRAVENOUS
  Administered 2022-09-17 – 2022-09-18 (×2): 10 mg/h via INTRAVENOUS
  Filled 2022-09-16 (×4): qty 125

## 2022-09-16 MED ORDER — DICLOFENAC SODIUM 1 % EX GEL: 2.0000 g | Freq: Four times a day (QID) | CUTANEOUS | Status: DC | PRN

## 2022-09-16 MED ORDER — APIXABAN 2.5 MG PO TABS
2.5000 mg | ORAL_TABLET | Freq: Two times a day (BID) | ORAL | Status: DC
  Administered 2022-09-16: 2.5 mg via ORAL
  Filled 2022-09-16: qty 1

## 2022-09-16 MED ORDER — CYCLOSPORINE 0.05 % OP EMUL
1.0000 [drp] | Freq: Two times a day (BID) | OPHTHALMIC | Status: DC
  Administered 2022-09-17 – 2022-10-04 (×34): 1 [drp] via OPHTHALMIC
  Filled 2022-09-16 (×41): qty 30

## 2022-09-16 MED ORDER — ATORVASTATIN CALCIUM 10 MG PO TABS
20.0000 mg | ORAL_TABLET | Freq: Every evening | ORAL | Status: DC
  Administered 2022-09-17 – 2022-10-01 (×14): 20 mg via ORAL
  Filled 2022-09-16 (×14): qty 2

## 2022-09-16 MED ORDER — METOPROLOL TARTRATE 5 MG/5ML IV SOLN
5.0000 mg | Freq: Once | INTRAVENOUS | Status: AC
  Administered 2022-09-16: 5 mg via INTRAVENOUS
  Filled 2022-09-16: qty 5

## 2022-09-16 MED ORDER — METOPROLOL TARTRATE 25 MG PO TABS
25.0000 mg | ORAL_TABLET | Freq: Two times a day (BID) | ORAL | Status: DC
  Administered 2022-09-16 – 2022-09-18 (×4): 25 mg via ORAL
  Filled 2022-09-16 (×4): qty 1

## 2022-09-16 MED ORDER — PRIMIDONE 250 MG PO TABS
250.0000 mg | ORAL_TABLET | Freq: Two times a day (BID) | ORAL | Status: DC
  Administered 2022-09-16 – 2022-09-17 (×2): 250 mg via ORAL
  Filled 2022-09-16 (×3): qty 1

## 2022-09-16 MED ORDER — DIGOXIN 125 MCG PO TABS: 0.1250 mg | ORAL_TABLET | Freq: Every day | ORAL | Status: DC

## 2022-09-16 MED ORDER — DIGOXIN 0.25 MG/ML IJ SOLN
0.2500 mg | Freq: Once | INTRAMUSCULAR | Status: AC
  Administered 2022-09-16: 0.25 mg via INTRAVENOUS
  Filled 2022-09-16: qty 2

## 2022-09-16 MED ORDER — LEVETIRACETAM 500 MG PO TABS
500.0000 mg | ORAL_TABLET | Freq: Two times a day (BID) | ORAL | Status: DC
  Administered 2022-09-16 – 2022-09-19 (×7): 500 mg via ORAL
  Filled 2022-09-16 (×8): qty 1

## 2022-09-16 MED ORDER — DULOXETINE HCL 20 MG PO CPEP
20.0000 mg | ORAL_CAPSULE | Freq: Every day | ORAL | Status: DC
  Administered 2022-09-17 – 2022-09-23 (×6): 20 mg via ORAL
  Filled 2022-09-16 (×9): qty 1

## 2022-09-16 MED ORDER — GABAPENTIN 100 MG PO CAPS
100.0000 mg | ORAL_CAPSULE | Freq: Every day | ORAL | Status: DC
  Administered 2022-09-16 – 2022-10-03 (×18): 100 mg via ORAL
  Filled 2022-09-16 (×18): qty 1

## 2022-09-16 NOTE — Consult Note (Signed)
Referring Physician: Marda Stalker, MD  [name] [name] is an 88 y.o. female.                       Chief Complaint: weakness and shortness of breath  HPI: 88 years old [name] female patient has shortness of breath x 2 days and weakness in legs x 1 day. She also noticed her legs getting heavier. She has h/o HTN, HLD, DVT, Obesity, diet controlled type 2 DM, peripheral neuropathy, stroke and essential tremors. She is on Cymbalta, gabapentin, Keppra and Mysoline use for tremors, peripheral neuropathy and stroke.  Her BNP is elevated at 465.1 pg. Troponin I level is normal. EKG shows atrial fibrillation with RVR, poorly responding to IV diltiazem.   Past Medical History:  Diagnosis Date   Anxiety    Diabetes ([place]) 10/27/2013   DVT (deep venous thrombosis) (HCC)    Essential tremor 10/27/2013   High cholesterol 1999   Hypertension    Ischemic stroke ([place]) 07/18/2014   Obesity, unspecified 10/27/2013   Pneumonia    Unspecified hereditary and idiopathic peripheral neuropathy 10/27/2013      Past Surgical History:  Procedure Laterality Date   CATARACT EXTRACTION Bilateral    FOOT SURGERY     IR KYPHO LUMBAR INC FX REDUCE BONE BX UNI/BIL CANNULATION INC/IMAGING  06/15/2019    Family History  Problem Relation Age of Onset   Stroke Mother    Heart Problems Sister    Heart Problems Brother    Coronary artery disease Other    Tremor Neg Hx    Social History:  reports that she has never smoked. She has never used smokeless tobacco. She reports that she does not drink alcohol and does not use drugs.  Allergies:  Allergies  Allergen Reactions   Tape Rash    Please do not use Plastic Tape    (Not in a hospital admission)   Results for orders placed or performed during the hospital encounter of 09/16/22 (from the past 48 hour(s))  CBC with Differential     Status: Abnormal   Collection Time: 09/16/22  7:29 PM  Result Value Ref Range   WBC 7.7 4.0 - 10.5 K/uL   RBC 4.83 3.87 -  5.11 MIL/uL   Hemoglobin 15.2 (H) 12.0 - 15.0 g/dL   HCT 46.1 (H) 36.0 - 46.0 %   MCV 95.4 80.0 - 100.0 fL   MCH 31.5 26.0 - 34.0 pg   MCHC 33.0 30.0 - 36.0 g/dL   RDW 14.9 11.5 - 15.5 %   Platelets 100 (L) 150 - 400 K/uL    Comment: REPEATED TO VERIFY   nRBC 0.0 0.0 - 0.2 %   Neutrophils Relative % 74 %   Neutro Abs 5.7 1.7 - 7.7 K/uL   Lymphocytes Relative 17 %   Lymphs Abs 1.3 0.7 - 4.0 K/uL   Monocytes Relative 8 %   Monocytes Absolute 0.6 0.1 - 1.0 K/uL   Eosinophils Relative 0 %   Eosinophils Absolute 0.0 0.0 - 0.5 K/uL   Basophils Relative 1 %   Basophils Absolute 0.1 0.0 - 0.1 K/uL   Immature Granulocytes 0 %   Abs Immature Granulocytes 0.03 0.00 - 0.07 K/uL    Comment: Performed at [facility], [address], [place], [place] [address]  Comprehensive metabolic panel     Status: Abnormal   Collection Time: 09/16/22  7:29 PM  Result Value Ref Range   Sodium 139 135 -  145 mmol/L   Potassium 4.4 3.5 - 5.1 mmol/L   Chloride 98 98 - 111 mmol/L   CO2 27 22 - 32 mmol/L   Glucose, Bld 114 (H) 70 - 99 mg/dL    Comment: Glucose reference range applies only to samples taken after fasting for at least 8 hours.   BUN 20 8 - 23 mg/dL   Creatinine, Ser 0.97 0.44 - 1.00 mg/dL   Calcium 8.8 (L) 8.9 - 10.3 mg/dL   Total Protein 7.1 6.5 - 8.1 g/dL   Albumin 3.2 (L) 3.5 - 5.0 g/dL   AST 48 (H) 15 - 41 U/L   ALT 59 (H) 0 - 44 U/L   Alkaline Phosphatase 110 38 - 126 U/L   Total Bilirubin 0.6 0.3 - 1.2 mg/dL   GFR, Estimated 56 (L) >60 mL/min    Comment: (NOTE) Calculated using the CKD-EPI Creatinine Equation (2021)    Anion gap 14 5 - 15    Comment: Performed at [facility], [place] [address] [place], [place] [address]  Troponin I (High Sensitivity)     Status: None   Collection Time: 09/16/22  7:29 PM  Result Value Ref Range   Troponin I (High Sensitivity) 6 <18 ng/L    Comment: (NOTE) Elevated high sensitivity troponin I (hsTnI) values and significant  changes  across serial measurements may suggest ACS but many other  chronic and acute conditions are known to elevate hsTnI results.  Refer to the "Links" section for chest pain algorithms and additional  guidance. Performed at [facility], [place] [address], [place], [place] [address]   Brain natriuretic peptide     Status: Abnormal   Collection Time: 09/16/22  7:29 PM  Result Value Ref Range   B Natriuretic Peptide 465.1 (H) 0.0 - 100.0 pg/mL    Comment: Performed at [place] [address] [place], [place] [address]  TSH     Status: None   Collection Time: 09/16/22  7:29 PM  Result Value Ref Range   TSH 1.792 0.350 - 4.500 uIU/mL    Comment: Performed by a 3rd Generation assay with a functional sensitivity of <=0.01 uIU/mL. Performed at [facility], [place] [address], [place], [place] [address]   Magnesium     Status: None   Collection Time: 09/16/22  7:29 PM  Result Value Ref Range   Magnesium 2.0 1.7 - 2.4 mg/dL    Comment: Performed at [facility], [place] [address], [place],  [address]   DG Lumbar Spine Complete  Result Date: 09/16/2022 CLINICAL DATA:  Pain.  Weakness. EXAM: LUMBAR SPINE - COMPLETE 4+ VIEW COMPARISON:  Lumbar spine radiographs 07/16/2019 FINDINGS: There are 5 non-rib-bearing lumbar-type vertebral bodies. There is 5 mm grade 1 anterolisthesis of L4 on L5, similar to prior. Mild dextrocurvature centered at T9-10. Moderate anterior L1 vertebral body height loss, unchanged from 07/16/2019. Augmentation cement is again seen within the L4 vertebral body with unchanged mild L4 height loss. Moderate posterior L4-5 and L5-S1 and mild L1-2 through L3-4 disc space narrowing. Degenerative vacuum phenomenon at L1-2 through L4-5. Moderate to high-grade atherosclerotic calcifications. IMPRESSION: Compared to 07/16/2019: 1. Unchanged moderate anterior L1 vertebral body height loss, chronic. 2. Moderate L4-5 and mild L5-S1 degenerative disc and endplate  changes. Electronically Signed   By: Yvonne Kendall M.D.   On: 09/16/2022 17:05   DG Chest 2 View  Result Date: 09/16/2022 CLINICAL DATA:  Cough EXAM: CHEST -  2 VIEW COMPARISON:  AP chest and abdomen 11/13/2018, CT lumbar spine 05/17/2019 FINDINGS: Cardiac silhouette again appears moderately enlarged. Mediastinal contours are unchanged. Mild calcification within the aortic arch. Increased now mild-to-moderate right and unchanged tiny left pleural effusions. Right-greater-than-left lower lung interstitial thickening. No pneumothorax is seen. Moderate to severe level disc space narrowing and endplate osteophytes of the thoracic spine. Moderate anterior height loss of the L1 vertebral body is unchanged from 05/17/2019 lumbar spine CT and chronic. IMPRESSION: Compared to 11/13/2018: 1. Increased now mild-to-moderate right and unchanged tiny left pleural effusions. 2. Unchanged cardiomegaly. Electronically Signed   By: Yvonne Kendall M.D.   On: 09/16/2022 17:00    Review Of Systems Constitutional: No fever, chills, weight loss or gain. Eyes: No vision change, wears glasses. No discharge or pain. Ears: No hearing loss, No tinnitus. Respiratory: No asthma, COPD, positive pneumonias. Positive shortness of breath. No hemoptysis. Cardiovascular: Positive chest pain, palpitation, leg edema. Gastrointestinal: No nausea, vomiting, diarrhea, constipation. No GI bleed. No hepatitis. Genitourinary: No dysuria, hematuria, kidney stone. No incontinance. Neurological: No headache, positive stroke, seizures. Positive tremors Psychiatry: No psych facility admission for anxiety, depression, suicide. No detox. Skin: No rash. Musculoskeletal: positive joint pain, no fibromyalgia. Positive neck pain, back pain. Lymphadenopathy: No lymphadenopathy. Hematology: No anemia or easy bruising.   Blood pressure 127/88, pulse (!) 157, temperature 98.3 F (36.8 C), temperature source Oral, resp. rate (!) 44, height 5' (1.524 m),  weight 68 kg, SpO2 93 %. Body mass index is 29.29 kg/m. General appearance: alert, cooperative, appears stated age and no distress Head: Normocephalic, atraumatic. Eyes: Brown eyes, bilateral lens implants, pink conjunctiva, corneas clear.  Neck: No adenopathy, no carotid bruit, no JVD, supple, symmetrical, trachea midline and thyroid not enlarged. Resp: Basal crackles to auscultation bilaterally. Cardio: Irregular rate and rhythm, S1, S2 normal, II/VI systolic murmur, no click, rub or gallop GI: Soft, non-tender; bowel sounds normal; no organomegaly. Extremities: 1 + lower leg edema, no cyanosis or clubbing. Skin: Warm and dry.  Neurologic: Alert and oriented X 3, normal strength. Normal coordination and gait.  Assessment/Plan Atrial fibrillation with RVR, CHA2DS2VASc score of 8 Acute systolic left heart failure HTN HLD H/O type 2 DM, diet controlled Old stroke Parkinson's disease Degenerative arthritis Obesity  Plan: Add Metoprolol and digoxin for rate control along with IV diltiazem IV lasix x 1 for fluid overload then as tolerated. PO Eliquis 2.5 mg. Bid for advanced age. Continue home medications  2-D echocardiogram in AM.  Time spent: Review of old records, Lab, x-rays, EKG, other cardiac tests, examination, discussion with patient/ER doctor/Nurse over 70 minutes.  Birdie Riddle, MD  09/16/2022, 9:05 PM

## 2022-09-16 NOTE — ED Notes (Signed)
Patient is being discharged from the Urgent Care and sent to the Emergency Department via POV . Per Sharyn Lull NP, patient is in need of higher level of care due to worsening pleural effusion . Patient is aware and verbalizes understanding of plan of care.  Vitals:   09/16/22 1414  BP: (!) 145/86  Pulse: 80  Resp: 20  Temp: 97.8 F (36.6 C)  SpO2: 96%

## 2022-09-16 NOTE — ED Triage Notes (Signed)
Pt was using a walker and going to PT (last seen on Friday) but legs are too weak and now patient unable to bear weight. Pt having left hip pain that radiates down left leg. Hx stroke and left side deficit.

## 2022-09-16 NOTE — H&P (Addendum)
Hospital Admission History and Physical Service Pager: [phone]  Patient name: [name] [name] Medical record number: [id number] Date of Birth: 30-Oct-1930 Age: 88 y.o. Gender: female  Primary Care Provider: [name], MD Consultants: Cardiology Code Status: Full Preferred Emergency Contact:   Name Relation Home Work Mobile   Zargham,[name] Son [phone]    Chief Complaint:  1 wk worsening fatigue, shortness of breath  Assessment and Plan: [name] [name] is a 88 y.o. female presenting with 1 week history of worsening leg weakness and 2 day hx of SOB, and was found to have pleural effusions/volume overload with what is likely acute on chronic CHF exacerbation in the setting of new onset a-fib with RVR.   * Atrial fibrillation with RVR (HCC) Presents with new osnet a-fib w/ RVR. Found to have elevated BNP but reassuringly low troponin. Initially started on diltiazem and cardiology was consulted in the ER. Patient also placed on digoxin and metoprolol for rate control. Also started on Eliquis for anticoagulation, was previosuly taking Plavix at home per med rec. CHA2DS2VASc score 8.  - Admit to progressive, FMTS, Dr. Leveda Anna - Cardiology following, appreciate care and recommendations - Cardiac monitoring - Continuous pulse ox - Start PO Eliquis 2.5 mg twice daily - Continue Diltiazem gtt - Start digoxin, metoprolol per cardiology recs - Vitals per routine - Echocardiogram    Acute on chronic diastolic heart failure (HCC) Presenting with SOB and lower extremity weakness, with new oxygen requirements, 2L [place] (no home oxygen). Appears hypervolemic on exam with CXR showing bilateral pleural effusions, likely secondary to a-fib with RVR. COVID/Flu negative. BNP 465, troponins 6.  Anticipate improvement with rate control and diuresis.  - Cardiology following, appreciate recs - Strict I's and O's, daily weight - Trend tropnins - F/u morning labs  - F/u echo  results   Chronic stable conditions HLD - atorvastatin 20mg  daily HTN - Metoprolol 25mg  BID Prior CVA w/R weakness - transition from Plavix to Eliquis Diet controlled T2DM - monitor glucose on BMP Neuropathy - Gabapentin 100mg  QHS Seizures - Keppra and Primidone Anxiety - Xanax 0.25mg  BID PRN, Cymbalta 20mg  daily   FEN/GI: Heart Healthy VTE Prophylaxis: Eliquis 2.5 mg   Disposition: Admit to progressive  History of Present Illness:  [name] [name] is a 88 y.o. female presenting with 1 week history of leg weakness and 2 day history of SOB. She is accompanied by her son, .   Patient was in the walker with PT and then suddenly had weakness and had to be held up. They went to the urgent care and she got an x-ray of her chest and they were told to come to the ER. In the last few days has also felt a bit short of breath with walking. They have an oxygen tank at home from a long time ago and would use it for about 15 minute at a time until she felt better. This has all been going on for about a week. Took her morning medications today. She denies any chest pain on admission.   Patient's son took her to UC earlier today and was found to have pleural effusion on CXR.  In the ED, was found to have new onset atrial fibrillation with RVR, was started on Diltiazem gtt. Cardiology was consulted for further management, metoprolol and digoxin was added along with IV diltiazem. IV Lasix x1 was added for volume overload.   Review Of Systems: Per HPI  Pertinent Past Medical History: Per  chart review, CVA ET HTN HLD Seizures Essential tremors Remainder reviewed in history tab.   Pertinent Past Surgical History: Per chart review, Bilateral cataract extraction Foot surgery IR Kypho lumbar inc fx reduce bone bx uni/bil cannulation inc/imaging Remainder reviewed in history tab.   Pertinent Social History: Tobacco use: No Alcohol use: No Other Substance use: No Lives with son, uses  walker and the wheelchair in the last few days  Pertinent Family History: Remainder reviewed in history tab.   Important Outpatient Medications: Plavix 75 mg daily Lipitor 20 mg daily Lasix 20 mg daily Amlodipine 2.5 mg daily Gabapentin 100 mg nightly Keppra 500 mg twice daily Primidone to 50 twice daily Xanax 0.25 mg twice daily as needed Cymbalta 20 mg daily Vitamin D  Remainder reviewed in medication history.   Objective: BP (!) 119/91 (BP Location: Right Arm)   Pulse 91   Temp (!) 97.4 F (36.3 C) (Oral)   Resp (!) 35   Ht 5' (1.524 m)   Wt 68 kg   SpO2 92%   BMI 29.29 kg/m  Exam: General: Elderly woman, NAD, son at bedside helping interpret as needed Eyes: grossly intact EOM, white sclera ENTM: Dry oral mucosa with cracked lips Cardiovascular: irregular RR, tachycardic Respiratory: symmetric chest expansion, normal work of breathing on 2L via [place] Gastrointestinal: Abdomen appears mildly distended,normoactive bowel sounds, nontender to palpation, 0-6/2 systolic murmur MSK: cool extremities, +1 pitting edema of BLE Neuro: Awake, alert, conversant. No acute focal deficits notes.  Psych: Appropriate mood and affect  Labs:  CBC BMET  Recent Labs  Lab 09/16/22 1929  WBC 7.7  HGB 15.2*  HCT 46.1*  PLT 100*   Recent Labs  Lab 09/16/22 1929  NA 139  K 4.4  CL 98  CO2 27  BUN 20  CREATININE 0.97  GLUCOSE 114*  CALCIUM 8.8*    BNP- 465.1 Trops-6> TSH- 1.792 MG- 2.0 Lipid panel- pending COVID/FLU- negative   EKG: Afib with RVR, rate of 179   Imaging Studies Performed: DG Lumbar Spine Complete Result Date: 09/16/2022 IMPRESSION: Compared to 07/16/2019: 1. Unchanged moderate anterior L1 vertebral body height loss, chronic. 2. Moderate L4-5 and mild L5-S1 degenerative disc and endplate changes.   DG Chest 2 View Result Date: 09/16/2022 IMPRESSION: Compared to 11/13/2018: 1. Increased now mild-to-moderate right and unchanged tiny left pleural  effusions. 2. Unchanged cardiomegaly.    Christene Slates, MD 09/17/2022, 12:33 AM PGY-1, [place] Intern pager: [phone], text pages welcome Secure chat group [facility] Upper-Level Resident Addendum   I have independently interviewed and examined the patient. I have discussed the above with the original author and agree with their documentation. My edits for correction/addition/clarification are in within the document. Please see also any attending notes.   Rise Patience, DO  PGY-3, [facility] 09/17/2022 12:40 AM  FPTS Service pager: [phone] (text pages welcome through [facility])

## 2022-09-16 NOTE — ED Triage Notes (Signed)
Patient seen at [facility] for weakness and legs giving out on the way to PT.  UC did chest xray that showed pleural effusion and sent her here.

## 2022-09-16 NOTE — ED Provider Notes (Signed)
[facility] EMERGENCY DEPARTMENT Provider Note   CSN: [id number] Arrival date & time: 09/16/22  1754     History  Chief Complaint  Patient presents with   Weakness    [name] [name] is a 88 y.o. female.  The history is provided by the patient, the spouse and medical records. No language interpreter was used.  Weakness Severity:  Severe Onset quality:  Gradual Timing:  Constant Progression:  Worsening Chronicity:  New Relieved by:  Nothing Worsened by:  Nothing Ineffective treatments:  None tried Associated symptoms: chest pain, cough and shortness of breath   Associated symptoms: no abdominal pain, no diarrhea, no dysuria, no numbness in extremities, no falls, no fever, no foul-smelling urine, no frequency, no headaches, no lethargy, no loss of consciousness, no nausea and no vomiting   Chest pain:    Quality: aching, pressure and tightness     Severity:  Moderate   Onset quality:  Gradual   Duration:  1 week   Timing:  Constant   Progression:  Waxing and waning Shortness of breath:    Severity:  Moderate   Onset quality:  Gradual   Timing:  Constant   Progression:  Unchanged Risk factors: congestive heart failure        Home Medications Prior to Admission medications   Medication Sig Start Date End Date Taking? Authorizing Provider  ACCU-CHEK AVIVA PLUS test strip USE AS INSTRUCTED TO TEST ONCE A DAY DX CODE:E11.9 11/22/19   Benay Pike, MD  Accu-Chek Softclix Lancets lancets Use as instructed 09/23/20   Benay Pike, MD  acetaminophen (TYLENOL) 325 MG tablet Take 2 tablets (650 mg total) by mouth every 6 (six) hours as needed. 06/01/17   Varney Biles, MD  ALPRAZolam Duanne Moron) 0.25 MG tablet Take 0.25 mg by mouth 2 (two) times daily as needed for anxiety.     [provider]  amLODipine (NORVASC) 2.5 MG tablet Take 2.5 mg by mouth daily. 05/29/17   [provider]  atorvastatin (LIPITOR) 20 MG tablet TAKE 1 TABLET BY  MOUTH EVERY DAY IN THE EVENING 05/10/22   Zola Button, MD  CALCIUM PO Take 1 tablet by mouth daily.    [provider]  Cholecalciferol (VITAMIN D PO) Take 1 tablet by mouth daily.    [provider]  clopidogrel (PLAVIX) 75 MG tablet TAKE 1 TABLET BY MOUTH EVERY DAY 06/17/22   Zola Button, MD  diclofenac Sodium (VOLTAREN) 1 % GEL Apply 2 g topically 4 (four) times daily as needed. 07/26/22   Zola Button, MD  DULoxetine (CYMBALTA) 20 MG capsule TAKE 1 CAPSULE BY MOUTH EVERY DAY 01/07/22   Jessy Oto, MD  furosemide (LASIX) 20 MG tablet Take 20 mg by mouth daily. 06/08/18   [provider]  gabapentin (NEURONTIN) 100 MG capsule Take 1 capsule (100 mg total) by mouth at bedtime. 03/05/22   Star Age, MD  levETIRAcetam (KEPPRA) 500 MG tablet Take 1 tablet (500 mg total) by mouth 2 (two) times daily. 03/05/22   Star Age, MD  lidocaine (LIDODERM) 5 % Place 1 patch onto the skin daily. Remove & Discard patch within 12 hours or as directed by MD 05/10/22   Zola Button, MD  Omega-3 Fatty Acids (FISH OIL) 1000 MG CAPS Take 1 capsule by mouth every other day.     [provider]  PAZEO 0.7 % SOLN Place 1 drop into both eyes at bedtime.  07/24/18   [provider]  potassium chloride SA (K-DUR,KLOR-CON) 20 MEQ tablet Take 20 mEq by mouth daily.     [provider]  primidone (MYSOLINE) 250 MG tablet Take 1 tablet (250 mg total) by mouth 2 (two) times daily. 03/05/22   Star Age, MD  RESTASIS 0.05 % ophthalmic emulsion Place 1 drop into both eyes 2 (two) times daily.  09/29/14   [provider]      Allergies    Tape    Review of Systems   Review of Systems  Constitutional:  Positive for fatigue. Negative for chills, diaphoresis and fever.  HENT:  Negative for congestion.   Eyes:  Negative for visual disturbance.  Respiratory:  Positive for cough, chest tightness and shortness of breath. Negative for wheezing.   Cardiovascular:   Positive for chest pain and palpitations. Negative for leg swelling.  Gastrointestinal:  Negative for abdominal pain, constipation, diarrhea, nausea and vomiting.  Genitourinary:  Negative for dysuria, flank pain and frequency.  Musculoskeletal:  Negative for back pain, falls, neck pain and neck stiffness.  Skin:  Negative for rash and wound.  Neurological:  Positive for weakness. Negative for loss of consciousness, light-headedness and headaches.  Psychiatric/Behavioral:  Negative for agitation.   All other systems reviewed and are negative.   Physical Exam Updated Vital Signs BP (!) 135/99 (BP Location: Left Arm)   Pulse 98   Temp 98.3 F (36.8 C) (Oral)   Resp (!) 24   Ht 5' (1.524 m)   Wt 68 kg   SpO2 93%   BMI 29.29 kg/m  Physical Exam Vitals and nursing note reviewed.  Constitutional:      Appearance: She is well-developed. She is ill-appearing.  HENT:     Head: Normocephalic and atraumatic.     Nose: No congestion.     Mouth/Throat:     Mouth: Mucous membranes are dry.     Pharynx: No oropharyngeal exudate or posterior oropharyngeal erythema.  Eyes:     Extraocular Movements: Extraocular movements intact.     Conjunctiva/sclera: Conjunctivae normal.     Pupils: Pupils are equal, round, and reactive to light.  Cardiovascular:     Rate and Rhythm: Tachycardia present. Rhythm irregular.     Pulses: Normal pulses.     Heart sounds: Murmur heard.  Pulmonary:     Effort: No respiratory distress.     Breath sounds: Rales present. No wheezing or rhonchi.  Chest:     Chest wall: No tenderness.  Abdominal:     General: Abdomen is flat.     Palpations: Abdomen is soft.     Tenderness: There is no abdominal tenderness. There is no guarding or rebound.  Musculoskeletal:        General: No swelling or tenderness.     Cervical back: Neck supple. No tenderness.     Right lower leg: Edema present.     Left lower leg: Edema present.  Skin:    General: Skin is warm and dry.      Capillary Refill: Capillary refill takes less than 2 seconds.     Findings: No erythema or rash.  Neurological:     Mental Status: She is alert. Mental status is at baseline.     Sensory: No sensory deficit.     Motor: Weakness present.     Comments: Weakness in right arm and right leg at baseline per patient.  Psychiatric:        Mood and Affect: Mood normal.     ED Results /  Procedures / Treatments   Labs (all labs ordered are listed, but only abnormal results are displayed) Labs Reviewed - No data to display  EKG EKG Interpretation  Date/Time:  Monday September 16 2022 18:30:00 EDT Ventricular Rate:  179 PR Interval:    QRS Duration: 64 QT Interval:  278 QTC Calculation: 480 R Axis:   -60 Text Interpretation: Atrial fibrillation with rapid ventricular response Left axis deviation Low voltage QRS Septal infarct , age undetermined Abnormal ECG When compared with ECG of 26-Apr-2020 16:47, PREVIOUS ECG IS PRESENT when compared to prior, new afib with RVR and faster rate. No STEMI Confirmed by Antony Blackbird [phone]) on 09/16/2022 6:34:19 PM  Radiology DG Lumbar Spine Complete  Result Date: 09/16/2022 CLINICAL DATA:  Pain.  Weakness. EXAM: LUMBAR SPINE - COMPLETE 4+ VIEW COMPARISON:  Lumbar spine radiographs 07/16/2019 FINDINGS: There are 5 non-rib-bearing lumbar-type vertebral bodies. There is 5 mm grade 1 anterolisthesis of L4 on L5, similar to prior. Mild dextrocurvature centered at T9-10. Moderate anterior L1 vertebral body height loss, unchanged from 07/16/2019. Augmentation cement is again seen within the L4 vertebral body with unchanged mild L4 height loss. Moderate posterior L4-5 and L5-S1 and mild L1-2 through L3-4 disc space narrowing. Degenerative vacuum phenomenon at L1-2 through L4-5. Moderate to high-grade atherosclerotic calcifications. IMPRESSION: Compared to 07/16/2019: 1. Unchanged moderate anterior L1 vertebral body height loss, chronic. 2. Moderate L4-5 and mild  L5-S1 degenerative disc and endplate changes. Electronically Signed   By: Yvonne Kendall M.D.   On: 09/16/2022 17:05   DG Chest 2 View  Result Date: 09/16/2022 CLINICAL DATA:  Cough EXAM: CHEST - 2 VIEW COMPARISON:  AP chest and abdomen 11/13/2018, CT lumbar spine 05/17/2019 FINDINGS: Cardiac silhouette again appears moderately enlarged. Mediastinal contours are unchanged. Mild calcification within the aortic arch. Increased now mild-to-moderate right and unchanged tiny left pleural effusions. Right-greater-than-left lower lung interstitial thickening. No pneumothorax is seen. Moderate to severe level disc space narrowing and endplate osteophytes of the thoracic spine. Moderate anterior height loss of the L1 vertebral body is unchanged from 05/17/2019 lumbar spine CT and chronic. IMPRESSION: Compared to 11/13/2018: 1. Increased now mild-to-moderate right and unchanged tiny left pleural effusions. 2. Unchanged cardiomegaly. Electronically Signed   By: Yvonne Kendall M.D.   On: 09/16/2022 17:00    Procedures Procedures    CRITICAL CARE Performed by: Gwenyth Allegra Russell Engelstad Total critical care time: 35 minutes Critical care time was exclusive of separately billable procedures and treating other patients. Critical care was necessary to treat or prevent imminent or life-threatening deterioration. Critical care was time spent personally by me on the following activities: development of treatment plan with patient and/or surrogate as well as nursing, discussions with consultants, evaluation of patient's response to treatment, examination of patient, obtaining history from patient or surrogate, ordering and performing treatments and interventions, ordering and review of laboratory studies, ordering and review of radiographic studies, pulse oximetry and re-evaluation of patient's condition.   Medications Ordered in ED Medications  diltiazem (CARDIZEM) 125 mg in dextrose 5% 125 mL (1 mg/mL) infusion (10 mg/hr  Intravenous Rate/Dose Change 09/16/22 2110)  ALPRAZolam (XANAX) tablet 0.25 mg (has no administration in time range)  atorvastatin (LIPITOR) tablet 20 mg (has no administration in time range)  diclofenac Sodium (VOLTAREN) 1 % topical gel 2 g (has no administration in time range)  DULoxetine (CYMBALTA) DR capsule 20 mg (has no administration in time range)  gabapentin (NEURONTIN) capsule 100 mg (100 mg Oral Given 09/16/22 2244)  levETIRAcetam (  KEPPRA) tablet 500 mg (500 mg Oral Given 09/16/22 2244)  primidone (MYSOLINE) tablet 250 mg (250 mg Oral Given 09/16/22 2244)  cycloSPORINE (RESTASIS) 0.05 % ophthalmic emulsion 1 drop (has no administration in time range)  apixaban (ELIQUIS) tablet 2.5 mg (2.5 mg Oral Given 09/16/22 2244)  metoprolol tartrate (LOPRESSOR) tablet 25 mg (25 mg Oral Given 09/16/22 2244)  digoxin (LANOXIN) tablet 0.125 mg (has no administration in time range)  digoxin (LANOXIN) 0.25 MG/ML injection 0.25 mg (0.25 mg Intravenous Given 09/16/22 2130)  metoprolol tartrate (LOPRESSOR) injection 5 mg (5 mg Intravenous Given 09/16/22 2139)  furosemide (LASIX) injection 40 mg (40 mg Intravenous Given 09/16/22 2134)    ED Course/ Medical Decision Making/ A&P                           Medical Decision Making Amount and/or Complexity of Data Reviewed Labs: ordered.  Risk Prescription drug management. Decision regarding hospitalization.    [name] [name] is an 88 y.o. female with a past medical history significant for CHF, hypertension, hyperlipidemia, previous stroke with residual right-sided weakness, seizures, diabetes, previous DVT, and anxiety who presents with 1 week of worsening fatigue, shortness of breath, chest tightness, palpitations, and inability to ambulate due to overall weakness.  According to patient and family, patient went to urgent care today for worsening shortness of breath and overall fatigue and x-ray was found to have worsening pleural effusion.  She was  sent in for evaluation given her history of CHF.  On arrival, in triage patient was found to have heart rate in the 170s with what appears to be new A-fib with RVR.  Family reports no history of A-fib and she does not take blood thinners.  She does have history of previous DVT but is not on blood thinners.  Patient is denying fevers or chills but does report a mild cough.  She is reporting she is having palpitations, chest tightness, and some shortness of breath.  She is tachypneic on my evaluation and is quite tachycardic in the 140s.  She has a resting tremor.  Her chest and abdomen were nontender.  She has edema in both legs and has right arm and right leg weakness which she reports is unchanged from baseline.  Denies any weakness in her face and no numbness on my exam initially.   While doing my exam, oxygen saturations were found to be in the mid 80s on a good waveform so she will be placed on oxygen.  Clinically I concerned that patient is in new A-fib with RVR that is causing her likely worsened fatigue, shortness of breath, chest discomfort, and may contribute to the worsening fluid overload.  We will hold on fluid administration at this time so that does not worsen given the new hypoxia we will start diltiazem.  We will get screening labs.  We will call cardiology.  Family medicine says that they will admit and would like to be called when labs and other work-up are completed.  She will be admitted for further management and work-up is done.  7:49 PM Joe spoke with Dr. Percival Spanish with cardiology who will see the patient in consultation.   7:56 PM Patient is reportedly a patient of Dr. Doylene Canard who saw her several years ago.  We will call Dr. Doylene Canard.  Patient will be admitted for further management.        Final Clinical Impression(s) / ED Diagnoses Final diagnoses:  Atrial fibrillation  with RVR (HCC)  Shortness of breath  Chest pain, unspecified type  Palpitations      Clinical Impression: 1. Atrial fibrillation with RVR ([place])   2. Shortness of breath   3. Chest pain, unspecified type   4. Palpitations     Disposition: Admit  This note was prepared with assistance of Dragon voice recognition software. Occasional wrong-word or sound-a-like substitutions may have occurred due to the inherent limitations of voice recognition software.      Lyndzie Zentz, Gwenyth Allegra, MD 09/16/22 2330

## 2022-09-16 NOTE — Assessment & Plan Note (Addendum)
She is euvolemic on exam.  Currently on 2 L [place] with reassuring O2 sats.  Exam have good work of breathing and clear BS bilaterally - Cardiology following, appreciate recs - Strict I's and O's, daily weight - Continue Torsemide 20mg  daily

## 2022-09-16 NOTE — ED Provider Notes (Incomplete)
[place]    CSN: [id number] Arrival date & time: 09/16/22  1403      History   Chief Complaint Chief Complaint  Patient presents with  . Extremity Weakness  . Leg Pain    HPI VALONDA PETTAWAY is a 88 y.o. female.    Extremity Weakness  Leg Pain  Past Medical History:  Diagnosis Date  . Anxiety   . Diabetes ([place]) 10/27/2013  . DVT (deep venous thrombosis) ([place])   . Essential tremor 10/27/2013  . High cholesterol 1999  . Hypertension   . Ischemic stroke ([place]) 07/18/2014  . Obesity, unspecified 10/27/2013  . Pneumonia   . Unspecified hereditary and idiopathic peripheral neuropathy 10/27/2013    Patient Active Problem List   Diagnosis Date Noted  . Chronic pain of left knee 07/26/2022  . Urinary incontinence 06/16/2022  . Urinary frequency 12/06/2020  . History of CVA (cerebrovascular accident) 04/26/2019  . CHF (congestive heart failure), NYHA class III, chronic, diastolic ([place]) [id number]  . Hypoxemia requiring supplemental oxygen 11/10/2018  . Fatigue 11/10/2018  . Osteopenia of multiple sites 11/10/2018  . Back pain 10/10/2016  . Seizures ([place]) 08/06/2014  . Hemiplegia, unspecified, affecting dominant side 08/05/2014  . Right sided weakness 08/05/2014  . CVA (cerebral vascular accident) ([place]) 07/21/2014  . HTN (hypertension) 07/18/2014  . HLD (hyperlipidemia) 07/18/2014  . Thrombocytopenia ([place]) 07/18/2014  . Essential tremor 10/27/2013  . Obesity, unspecified 10/27/2013  . History of peripheral neuropathy 10/27/2013    Past Surgical History:  Procedure Laterality Date  . CATARACT EXTRACTION Bilateral   . FOOT SURGERY    . IR KYPHO LUMBAR INC FX REDUCE BONE BX UNI/BIL CANNULATION INC/IMAGING  06/15/2019    OB History   No obstetric history on file.      Home Medications    Prior to Admission medications   Medication Sig Start Date End Date Taking? Authorizing Provider  ACCU-CHEK AVIVA PLUS test strip USE AS INSTRUCTED TO TEST  ONCE A DAY DX CODE:E11.9 11/22/19   Benay Pike, MD  Accu-Chek Softclix Lancets lancets Use as instructed 09/23/20   Benay Pike, MD  acetaminophen (TYLENOL) 325 MG tablet Take 2 tablets (650 mg total) by mouth every 6 (six) hours as needed. 06/01/17   Varney Biles, MD  ALPRAZolam Duanne Moron) 0.25 MG tablet Take 0.25 mg by mouth 2 (two) times daily as needed for anxiety.     [provider]  amLODipine (NORVASC) 2.5 MG tablet Take 2.5 mg by mouth daily. 05/29/17   [provider]  atorvastatin (LIPITOR) 20 MG tablet TAKE 1 TABLET BY MOUTH EVERY DAY IN THE EVENING 05/10/22   Zola Button, MD  CALCIUM PO Take 1 tablet by mouth daily.    [provider]  Cholecalciferol (VITAMIN D PO) Take 1 tablet by mouth daily.    [provider]  clopidogrel (PLAVIX) 75 MG tablet TAKE 1 TABLET BY MOUTH EVERY DAY 06/17/22   Zola Button, MD  diclofenac Sodium (VOLTAREN) 1 % GEL Apply 2 g topically 4 (four) times daily as needed. 07/26/22   Zola Button, MD  DULoxetine (CYMBALTA) 20 MG capsule TAKE 1 CAPSULE BY MOUTH EVERY DAY 01/07/22   Jessy Oto, MD  furosemide (LASIX) 20 MG tablet Take 20 mg by mouth daily. 06/08/18   [provider]  gabapentin (NEURONTIN) 100 MG capsule Take 1 capsule (100 mg total) by mouth at bedtime. 03/05/22   Star Age, MD  levETIRAcetam (KEPPRA) 500 MG tablet Take  1 tablet (500 mg total) by mouth 2 (two) times daily. 03/05/22   Star Age, MD  lidocaine (LIDODERM) 5 % Place 1 patch onto the skin daily. Remove & Discard patch within 12 hours or as directed by MD 05/10/22   Zola Button, MD  Omega-3 Fatty Acids (FISH OIL) 1000 MG CAPS Take 1 capsule by mouth every other day.     [provider]  PAZEO 0.7 % SOLN Place 1 drop into both eyes at bedtime.  07/24/18   [provider]  potassium chloride SA (K-DUR,KLOR-CON) 20 MEQ tablet Take 20 mEq by mouth daily.     [provider]  primidone (MYSOLINE) 250 MG tablet  Take 1 tablet (250 mg total) by mouth 2 (two) times daily. 03/05/22   Star Age, MD  RESTASIS 0.05 % ophthalmic emulsion Place 1 drop into both eyes 2 (two) times daily.  09/29/14   [provider]    Family History Family History  Problem Relation Age of Onset  . Stroke Mother   . Heart Problems Sister   . Heart Problems Brother   . Coronary artery disease Other   . Tremor Neg Hx     Social History Social History   Tobacco Use  . Smoking status: Never  . Smokeless tobacco: Never  Vaping Use  . Vaping Use: Never used  Substance Use Topics  . Alcohol use: No    Alcohol/week: 0.0 standard drinks of alcohol  . Drug use: No     Allergies   Tape   Review of Systems Review of Systems  Musculoskeletal:  Positive for extremity weakness.    Physical Exam Triage Vital Signs ED Triage Vitals  Enc Vitals Group     BP 09/16/22 1414 (!) 145/86     Pulse Rate 09/16/22 1414 80     Resp 09/16/22 1414 20     Temp 09/16/22 1414 97.8 F (36.6 C)     Temp Source 09/16/22 1414 Oral     SpO2 09/16/22 1414 96 %     Weight --      Height --      Head Circumference --      Peak Flow --      Pain Score 09/16/22 1412 9     Pain Loc --      Pain Edu? --      Excl. in [place]? --    No data found.  Updated Vital Signs BP (!) 145/86 (BP Location: Left Arm)   Pulse 80   Temp 97.8 F (36.6 C) (Oral)   Resp 20   SpO2 96%   Visual Acuity Right Eye Distance:   Left Eye Distance:   Bilateral Distance:    Right Eye Near:   Left Eye Near:    Bilateral Near:     Physical Exam   UC Treatments / Results  Labs (all labs ordered are listed, but only abnormal results are displayed) Labs Reviewed - No data to display  EKG   Radiology DG Lumbar Spine Complete  Result Date: 09/16/2022 CLINICAL DATA:  Pain.  Weakness. EXAM: LUMBAR SPINE - COMPLETE 4+ VIEW COMPARISON:  Lumbar spine radiographs 07/16/2019 FINDINGS: There are 5 non-rib-bearing lumbar-type vertebral  bodies. There is 5 mm grade 1 anterolisthesis of L4 on L5, similar to prior. Mild dextrocurvature centered at T9-10. Moderate anterior L1 vertebral body height loss, unchanged from 07/16/2019. Augmentation cement is again seen within the L4 vertebral body with unchanged mild L4 height loss. Moderate  posterior L4-5 and L5-S1 and mild L1-2 through L3-4 disc space narrowing. Degenerative vacuum phenomenon at L1-2 through L4-5. Moderate to high-grade atherosclerotic calcifications. IMPRESSION: Compared to 07/16/2019: 1. Unchanged moderate anterior L1 vertebral body height loss, chronic. 2. Moderate L4-5 and mild L5-S1 degenerative disc and endplate changes. Electronically Signed   By: Yvonne Kendall M.D.   On: 09/16/2022 17:05   DG Chest 2 View  Result Date: 09/16/2022 CLINICAL DATA:  Cough EXAM: CHEST - 2 VIEW COMPARISON:  AP chest and abdomen 11/13/2018, CT lumbar spine 05/17/2019 FINDINGS: Cardiac silhouette again appears moderately enlarged. Mediastinal contours are unchanged. Mild calcification within the aortic arch. Increased now mild-to-moderate right and unchanged tiny left pleural effusions. Right-greater-than-left lower lung interstitial thickening. No pneumothorax is seen. Moderate to severe level disc space narrowing and endplate osteophytes of the thoracic spine. Moderate anterior height loss of the L1 vertebral body is unchanged from 05/17/2019 lumbar spine CT and chronic. IMPRESSION: Compared to 11/13/2018: 1. Increased now mild-to-moderate right and unchanged tiny left pleural effusions. 2. Unchanged cardiomegaly. Electronically Signed   By: Yvonne Kendall M.D.   On: 09/16/2022 17:00    Procedures Procedures (including critical care time)  Medications Ordered in UC Medications - No data to display  Initial Impression / Assessment and Plan / UC Course  I have reviewed the triage vital signs and the nursing notes.  Pertinent labs & imaging results that were available during my care of the  patient were reviewed by me and considered in my medical decision making (see chart for details).     *** Final Clinical Impressions(s) / UC Diagnoses   Final diagnoses:  None   Discharge Instructions   None    ED Prescriptions   None    PDMP not reviewed this encounter.

## 2022-09-16 NOTE — Discharge Instructions (Signed)
The chest x-ray shows that your pleural effusion (fluid build up on the right lung) has gotten bigger.  I would like for you to go to the nearest emergency department to have this further evaluated.

## 2022-09-17 ENCOUNTER — Observation Stay (HOSPITAL_COMMUNITY): Payer: Medicare Other

## 2022-09-17 ENCOUNTER — Telehealth (HOSPITAL_COMMUNITY): Payer: Self-pay | Admitting: Pharmacy Technician

## 2022-09-17 ENCOUNTER — Other Ambulatory Visit (HOSPITAL_COMMUNITY): Payer: Self-pay

## 2022-09-17 DIAGNOSIS — M25561 Pain in right knee: Secondary | ICD-10-CM | POA: Diagnosis not present

## 2022-09-17 DIAGNOSIS — M79604 Pain in right leg: Secondary | ICD-10-CM | POA: Diagnosis not present

## 2022-09-17 DIAGNOSIS — Z515 Encounter for palliative care: Secondary | ICD-10-CM | POA: Diagnosis not present

## 2022-09-17 DIAGNOSIS — R0602 Shortness of breath: Secondary | ICD-10-CM | POA: Diagnosis not present

## 2022-09-17 DIAGNOSIS — G8929 Other chronic pain: Secondary | ICD-10-CM | POA: Diagnosis not present

## 2022-09-17 DIAGNOSIS — J9601 Acute respiratory failure with hypoxia: Secondary | ICD-10-CM | POA: Diagnosis not present

## 2022-09-17 DIAGNOSIS — Z66 Do not resuscitate: Secondary | ICD-10-CM | POA: Diagnosis not present

## 2022-09-17 DIAGNOSIS — R7989 Other specified abnormal findings of blood chemistry: Secondary | ICD-10-CM | POA: Diagnosis not present

## 2022-09-17 DIAGNOSIS — Y92239 Unspecified place in hospital as the place of occurrence of the external cause: Secondary | ICD-10-CM | POA: Diagnosis not present

## 2022-09-17 DIAGNOSIS — D6832 Hemorrhagic disorder due to extrinsic circulating anticoagulants: Secondary | ICD-10-CM | POA: Diagnosis not present

## 2022-09-17 DIAGNOSIS — I110 Hypertensive heart disease with heart failure: Secondary | ICD-10-CM | POA: Diagnosis present

## 2022-09-17 DIAGNOSIS — K567 Ileus, unspecified: Secondary | ICD-10-CM | POA: Diagnosis not present

## 2022-09-17 DIAGNOSIS — E860 Dehydration: Secondary | ICD-10-CM | POA: Diagnosis not present

## 2022-09-17 DIAGNOSIS — I2782 Chronic pulmonary embolism: Secondary | ICD-10-CM | POA: Diagnosis not present

## 2022-09-17 DIAGNOSIS — M79671 Pain in right foot: Secondary | ICD-10-CM | POA: Diagnosis not present

## 2022-09-17 DIAGNOSIS — G40909 Epilepsy, unspecified, not intractable, without status epilepticus: Secondary | ICD-10-CM | POA: Diagnosis not present

## 2022-09-17 DIAGNOSIS — E7800 Pure hypercholesterolemia, unspecified: Secondary | ICD-10-CM | POA: Diagnosis present

## 2022-09-17 DIAGNOSIS — I82611 Acute embolism and thrombosis of superficial veins of right upper extremity: Secondary | ICD-10-CM | POA: Diagnosis not present

## 2022-09-17 DIAGNOSIS — N390 Urinary tract infection, site not specified: Secondary | ICD-10-CM | POA: Diagnosis not present

## 2022-09-17 DIAGNOSIS — D696 Thrombocytopenia, unspecified: Secondary | ICD-10-CM | POA: Diagnosis not present

## 2022-09-17 DIAGNOSIS — E1140 Type 2 diabetes mellitus with diabetic neuropathy, unspecified: Secondary | ICD-10-CM | POA: Diagnosis present

## 2022-09-17 DIAGNOSIS — R609 Edema, unspecified: Secondary | ICD-10-CM | POA: Diagnosis not present

## 2022-09-17 DIAGNOSIS — M7989 Other specified soft tissue disorders: Secondary | ICD-10-CM | POA: Diagnosis not present

## 2022-09-17 DIAGNOSIS — M792 Neuralgia and neuritis, unspecified: Secondary | ICD-10-CM | POA: Diagnosis not present

## 2022-09-17 DIAGNOSIS — E669 Obesity, unspecified: Secondary | ICD-10-CM | POA: Diagnosis present

## 2022-09-17 DIAGNOSIS — D62 Acute posthemorrhagic anemia: Secondary | ICD-10-CM | POA: Diagnosis not present

## 2022-09-17 DIAGNOSIS — K254 Chronic or unspecified gastric ulcer with hemorrhage: Secondary | ICD-10-CM | POA: Diagnosis present

## 2022-09-17 DIAGNOSIS — R5381 Other malaise: Secondary | ICD-10-CM | POA: Diagnosis not present

## 2022-09-17 DIAGNOSIS — E876 Hypokalemia: Secondary | ICD-10-CM | POA: Diagnosis present

## 2022-09-17 DIAGNOSIS — I10 Essential (primary) hypertension: Secondary | ICD-10-CM | POA: Diagnosis not present

## 2022-09-17 DIAGNOSIS — I9589 Other hypotension: Secondary | ICD-10-CM | POA: Diagnosis not present

## 2022-09-17 DIAGNOSIS — I480 Paroxysmal atrial fibrillation: Secondary | ICD-10-CM | POA: Diagnosis not present

## 2022-09-17 DIAGNOSIS — Z7901 Long term (current) use of anticoagulants: Secondary | ICD-10-CM | POA: Diagnosis not present

## 2022-09-17 DIAGNOSIS — I824Y1 Acute embolism and thrombosis of unspecified deep veins of right proximal lower extremity: Secondary | ICD-10-CM | POA: Diagnosis not present

## 2022-09-17 DIAGNOSIS — Z7189 Other specified counseling: Secondary | ICD-10-CM | POA: Diagnosis not present

## 2022-09-17 DIAGNOSIS — I5033 Acute on chronic diastolic (congestive) heart failure: Secondary | ICD-10-CM | POA: Diagnosis not present

## 2022-09-17 DIAGNOSIS — I69351 Hemiplegia and hemiparesis following cerebral infarction affecting right dominant side: Secondary | ICD-10-CM | POA: Diagnosis not present

## 2022-09-17 DIAGNOSIS — J90 Pleural effusion, not elsewhere classified: Secondary | ICD-10-CM | POA: Diagnosis not present

## 2022-09-17 DIAGNOSIS — K922 Gastrointestinal hemorrhage, unspecified: Secondary | ICD-10-CM | POA: Diagnosis not present

## 2022-09-17 DIAGNOSIS — R002 Palpitations: Secondary | ICD-10-CM | POA: Diagnosis present

## 2022-09-17 DIAGNOSIS — Z1152 Encounter for screening for COVID-19: Secondary | ICD-10-CM | POA: Diagnosis not present

## 2022-09-17 DIAGNOSIS — M25552 Pain in left hip: Secondary | ICD-10-CM | POA: Insufficient documentation

## 2022-09-17 DIAGNOSIS — J9811 Atelectasis: Secondary | ICD-10-CM | POA: Diagnosis present

## 2022-09-17 DIAGNOSIS — I4891 Unspecified atrial fibrillation: Secondary | ICD-10-CM | POA: Diagnosis not present

## 2022-09-17 DIAGNOSIS — I82541 Chronic embolism and thrombosis of right tibial vein: Secondary | ICD-10-CM | POA: Diagnosis not present

## 2022-09-17 DIAGNOSIS — I82441 Acute embolism and thrombosis of right tibial vein: Secondary | ICD-10-CM | POA: Diagnosis not present

## 2022-09-17 DIAGNOSIS — G250 Essential tremor: Secondary | ICD-10-CM | POA: Diagnosis present

## 2022-09-17 DIAGNOSIS — I2699 Other pulmonary embolism without acute cor pulmonale: Secondary | ICD-10-CM | POA: Diagnosis present

## 2022-09-17 LAB — COMPREHENSIVE METABOLIC PANEL
ALT: 59 U/L — ABNORMAL HIGH (ref 0–44)
AST: 51 U/L — ABNORMAL HIGH (ref 15–41)
Albumin: 3.1 g/dL — ABNORMAL LOW (ref 3.5–5.0)
Alkaline Phosphatase: 106 U/L (ref 38–126)
Anion gap: 12 (ref 5–15)
BUN: 18 mg/dL (ref 8–23)
CO2: 27 mmol/L (ref 22–32)
Calcium: 8.6 mg/dL — ABNORMAL LOW (ref 8.9–10.3)
Chloride: 100 mmol/L (ref 98–111)
Creatinine, Ser: 0.84 mg/dL (ref 0.44–1.00)
GFR, Estimated: >60 mL/min (ref >60)
Glucose, Bld: 122 mg/dL — ABNORMAL HIGH (ref 70–99)
Potassium: 4.1 mmol/L (ref 3.5–5.1)
Sodium: 139 mmol/L (ref 135–145)
Total Bilirubin: 0.6 mg/dL (ref 0.3–1.2)
Total Protein: 6.9 g/dL (ref 6.5–8.1)

## 2022-09-17 LAB — CBC WITH DIFFERENTIAL/PLATELET
Abs Immature Granulocytes: 0.02 10*3/uL (ref 0.00–0.07)
Basophils Absolute: 0.1 10*3/uL (ref 0.0–0.1)
Basophils Relative: 1 %
Eosinophils Absolute: 0 10*3/uL (ref 0.0–0.5)
Eosinophils Relative: 0 %
HCT: 44.4 % (ref 36.0–46.0)
Hemoglobin: 14.6 g/dL (ref 12.0–15.0)
Immature Granulocytes: 0 %
Lymphocytes Relative: 17 %
Lymphs Abs: 1.1 10*3/uL (ref 0.7–4.0)
MCH: 31.2 pg (ref 26.0–34.0)
MCHC: 32.9 g/dL (ref 30.0–36.0)
MCV: 94.9 fL (ref 80.0–100.0)
Monocytes Absolute: 0.8 10*3/uL (ref 0.1–1.0)
Monocytes Relative: 12 %
Neutro Abs: 4.7 10*3/uL (ref 1.7–7.7)
Neutrophils Relative %: 70 %
Platelets: 96 10*3/uL — ABNORMAL LOW (ref 150–400)
RBC: 4.68 MIL/uL (ref 3.87–5.11)
RDW: 14.7 % (ref 11.5–15.5)
WBC: 6.7 10*3/uL (ref 4.0–10.5)
nRBC: 0 % (ref 0.0–0.2)

## 2022-09-17 LAB — LIPID PANEL
Cholesterol: 161 mg/dL (ref 0–200)
HDL: 67 mg/dL (ref >40)
LDL Cholesterol: 79 mg/dL (ref 0–99)
Total CHOL/HDL Ratio: 2.4 RATIO
Triglycerides: 77 mg/dL (ref <150)
VLDL: 15 mg/dL (ref 0–40)

## 2022-09-17 LAB — ECHOCARDIOGRAM COMPLETE
AR max vel: 2.08 cm2
AV Area VTI: 1.93 cm2
AV Area mean vel: 2.18 cm2
AV Mean grad: 4.5 mmHg
AV Peak grad: 8.5 mmHg
Ao pk vel: 1.46 m/s
Height: 60 in
P 1/2 time: 657 msec
S' Lateral: 2.8 cm
Weight: 2400 oz

## 2022-09-17 MED ORDER — APIXABAN 5 MG PO TABS
5.0000 mg | ORAL_TABLET | Freq: Two times a day (BID) | ORAL | Status: DC
  Administered 2022-09-17 – 2022-09-22 (×10): 5 mg via ORAL
  Filled 2022-09-17 (×10): qty 1

## 2022-09-17 MED ORDER — FUROSEMIDE 10 MG/ML IJ SOLN
40.0000 mg | Freq: Once | INTRAMUSCULAR | Status: AC
  Administered 2022-09-17: 40 mg via INTRAVENOUS
  Filled 2022-09-17: qty 4

## 2022-09-17 NOTE — Assessment & Plan Note (Addendum)
CTAP negative for [facility] pathology. Suspect pain in LLE 2/2 deconditioning vs post-CVA. However, new and acute RLE pain found to be DVT as seen on [place]. -OOB with PT/OT for conditioning -Discussed serial [place] on 11/13 and 11/22 -Anticoagulation per PE

## 2022-09-17 NOTE — Evaluation (Signed)
Physical Therapy Evaluation Patient Details Name: [name] [name] MRN: [id number] DOB: 09/08/1930 Today's Date: 09/17/2022  History of Present Illness  88 y.o. female presenting with leg weakness and shortness of breath, found to be volume overloaded in the setting of acute on chronic CHF exacerbation and new onset A-fib with RVR. Admitted 09/16/22.  Clinical Impression  Pt admitted with above diagnosis. Convoluted hx even with translation  (Interpreter service Albertine Grates [phone]) Pt has been going to [facility] working on LE strength but states progressive weakness. Lives with son who is not home at times. Pt required up to mod assist for bed mobility and transfers. Resting comfortably in bed with call bell in reach, requests son "bob" be notified after 7pm tonight of her current status. Based on current functional deficits, patient may need short term SNF but will monitor and progress as tolerated during admission. Pt currently with functional limitations due to the deficits listed below (see PT Problem List). Pt will benefit from skilled PT to increase their independence and safety with mobility to allow discharge to the venue listed below.          Recommendations for follow up therapy are one component of a multi-disciplinary discharge planning process, led by the attending physician.  Recommendations may be updated based on patient status, additional functional criteria and insurance authorization.  Follow Up Recommendations Skilled nursing-short term rehab (<3 hours/day) (Will update if patient progresses significantly) Can patient physically be transported by private vehicle: Yes    Assistance Recommended at Discharge Frequent or constant Supervision/Assistance  Patient can return home with the following  A lot of help with walking and/or transfers;A lot of help with bathing/dressing/bathroom;Assistance with cooking/housework;Assist for transportation    Equipment Recommendations None  recommended by PT  Recommendations for Other Services       Functional Status Assessment Patient has had a recent decline in their functional status and demonstrates the ability to make significant improvements in function in a reasonable and predictable amount of time.     Precautions / Restrictions Precautions Precautions: Fall Restrictions Weight Bearing Restrictions: No      Mobility  Bed Mobility Overal bed mobility: Needs Assistance Bed Mobility: Supine to Sit, Sit to Supine     Supine to sit: Mod assist Sit to supine: Mod assist   General bed mobility comments: Mod assist for transitions on stretcher in ED. Cues for technique but ultimately required LE and trunk support to rise and lower back into bed. Mod assist for boost with bridge to [facility].    Transfers Overall transfer level: Needs assistance Equipment used: 1 person hand held assist Transfers: Sit to/from Stand, Bed to chair/wheelchair/BSC Sit to Stand: Mod assist     Squat pivot transfers: Mod assist     General transfer comment: Mod assist to transition from stretcher to chair with hand held support. Obvious LE weakness but difficult for patient due to height of stretcher.    Ambulation/Gait               General Gait Details: Deferred  Stairs            Wheelchair Mobility    Modified Rankin (Stroke Patients Only)       Balance Overall balance assessment: Needs assistance Sitting-balance support: No upper extremity supported, Feet unsupported Sitting balance-Leahy Scale: Fair Sitting balance - Comments: Able to sit EOB x 5 min reaching towards limits of stability with fair control.   Standing balance support: Single extremity supported, During functional activity  Standing balance-Leahy Scale: Poor                               Pertinent Vitals/Pain      Home Living Family/patient expects to be discharged to:: Private residence Living Arrangements:  Children Available Help at Discharge: Family;Available PRN/intermittently Type of Home: House Home Access: Level entry       Home Layout: One level Home Equipment: Conservation officer, nature (2 wheels) Additional Comments: Difficult translation even with translator - some information different from previous visit.    Prior Function Prior Level of Function : Other (comment) (Difficult to asses - per outpatient notes she was walking approx 50 feet and needed min assist with sit<>stand.)                     Hand Dominance   Dominant Hand: Right    Extremity/Trunk Assessment   Upper Extremity Assessment Upper Extremity Assessment: Defer to OT evaluation    Lower Extremity Assessment Lower Extremity Assessment: Generalized weakness       Communication   Communication: Prefers language other than English  Cognition Arousal/Alertness: Awake/alert Behavior During Therapy: Anxious Overall Cognitive Status: Difficult to assess Area of Impairment: Orientation                 Orientation Level: Disoriented to, Time             General Comments: states it is september.        General Comments General comments (skin integrity, edema, etc.): SpO2 94% on 3L supplemental O2, RR 22, HR varied between mid 90s and briefly 144 after transfering before quickly returning to low 100s. Denies pain, moderate DOE present . Interpreter service Albertine Grates [phone]    Exercises     Assessment/Plan    PT Assessment Patient needs continued PT services  PT Problem List Decreased strength;Decreased activity tolerance;Decreased balance;Decreased range of motion;Decreased mobility;Decreased cognition;Decreased knowledge of use of DME;Cardiopulmonary status limiting activity;Obesity       PT Treatment Interventions DME instruction;Gait training;Functional mobility training;Therapeutic exercise;Therapeutic activities;Balance training;Neuromuscular re-education;Cognitive remediation;Patient/family  education    PT Goals (Current goals can be found in the Care Plan section)  Acute Rehab PT Goals Patient Stated Goal: Have team call son after 7PM for an updated PT Goal Formulation: With patient Time For Goal Achievement: 10/01/22 Potential to Achieve Goals: Good    Frequency Min 3X/week     Co-evaluation               AM-PAC PT "6 Clicks" Mobility  Outcome Measure Help needed turning from your back to your side while in a flat bed without using bedrails?: A Lot Help needed moving from lying on your back to sitting on the side of a flat bed without using bedrails?: A Lot Help needed moving to and from a bed to a chair (including a wheelchair)?: A Lot Help needed standing up from a chair using your arms (e.g., wheelchair or bedside chair)?: A Lot Help needed to walk in hospital room?: A Lot Help needed climbing 3-5 steps with a railing? : Total 6 Click Score: 11    End of Session Equipment Utilized During Treatment: Gait belt;Oxygen Activity Tolerance: Patient limited by fatigue Patient left: in bed;with call bell/phone within reach Nurse Communication: Other (comment) (Pt requests son "Mikki Santee" be contacted after 7pm) PT Visit Diagnosis: Unsteadiness on feet (R26.81);Muscle weakness (generalized) (M62.81);Difficulty in walking, not elsewhere classified (R26.2)  Time: 1751-0258 PT Time Calculation (min) (ACUTE ONLY): 42 min   Charges:   PT Evaluation $PT Eval Moderate Complexity: 1 Mod PT Treatments $Therapeutic Activity: 23-37 mins        Candie Mile, PT, DPT Physical Therapist Acute [facility] [place] 09/17/2022, 4:40 PM

## 2022-09-17 NOTE — Consult Note (Signed)
Ref: Zola Button, MD   Subjective:  Awake. Moderate respiratory distress. VS stable. Atrial fibrillation continues but heart rate 80-90's. Lipid panel, BMET and CBC are stable. Echocardiogram shows moderate LVH and normal LV systolic function.  Objective:  Vital Signs in the last 24 hours: Temp:  [97.4 F (36.3 C)-98.4 F (36.9 C)] 97.4 F (36.3 C) (10/24 0641) Pulse Rate:  [54-159] 87 (10/24 0700) Resp:  [16-44] 22 (10/24 0700) BP: (111-145)/(66-113) 113/73 (10/24 0700) SpO2:  [90 %-100 %] 94 % (10/24 0700) Weight:  [68 kg] 68 kg (10/23 1825)  Physical Exam: BP Readings from Last 1 Encounters:  09/17/22 113/73     Wt Readings from Last 1 Encounters:  09/16/22 68 kg    Weight change:  Body mass index is 29.29 kg/m. HEENT: [place]/AT, Eyes-Brown, Conjunctiva-Pink, Sclera-Non-icteric Neck: No JVD, No bruit, Trachea midline. Lungs:  Clearing, Bilateral. Cardiac:  Irregular rhythm, normal S1 and S2, no S3. II/VI systolic murmur. Abdomen:  Soft, non-tender. BS present. Extremities:  1 + edema present. No cyanosis. No clubbing. CNS: AxOx3, Cranial nerves grossly intact, moves all 4 extremities.  Skin: Warm and dry.   Intake/Output from previous day: No intake/output data recorded.    Lab Results: BMET    Component Value Date/Time   NA 139 09/17/2022 0207   NA 139 09/16/2022 1929   NA 141 06/14/2022 1505   NA 141 04/26/2020 1708   NA 140 06/15/2019 0841   NA 141 06/22/2018 1448   K 4.1 09/17/2022 0207   K 4.4 09/16/2022 1929   K 4.9 06/14/2022 1505   CL 100 09/17/2022 0207   CL 98 09/16/2022 1929   CL 103 06/14/2022 1505   CO2 27 09/17/2022 0207   CO2 27 09/16/2022 1929   CO2 23 06/14/2022 1505   GLUCOSE 122 (H) 09/17/2022 0207   GLUCOSE 114 (H) 09/16/2022 1929   GLUCOSE 71 06/14/2022 1505   GLUCOSE 89 04/26/2020 1708   GLUCOSE 96 06/15/2019 0841   BUN 18 09/17/2022 0207   BUN 20 09/16/2022 1929   BUN 17 06/14/2022 1505   BUN 19 04/26/2020 1708   BUN 11  06/15/2019 0841   BUN 14 06/22/2018 1448   CREATININE 0.84 09/17/2022 0207   CREATININE 0.97 09/16/2022 1929   CREATININE 0.78 06/14/2022 1505   CALCIUM 8.6 (L) 09/17/2022 0207   CALCIUM 8.8 (L) 09/16/2022 1929   CALCIUM 9.2 06/14/2022 1505   GFRNONAA >60 09/17/2022 0207   GFRNONAA 56 (L) 09/16/2022 1929   GFRNONAA 60 04/26/2020 1708   GFRNONAA >60 06/15/2019 0841   GFRNONAA >60 11/13/2018 1941   GFRAA 69 04/26/2020 1708   GFRAA >60 06/15/2019 0841   GFRAA >60 11/13/2018 1941   CBC    Component Value Date/Time   WBC 6.7 09/17/2022 0207   RBC 4.68 09/17/2022 0207   HGB 14.6 09/17/2022 0207   HGB 15.2 04/26/2020 1708   HCT 44.4 09/17/2022 0207   HCT 46.5 04/26/2020 1708   PLT 96 (L) 09/17/2022 0207   PLT 102 (L) 04/26/2020 1708   MCV 94.9 09/17/2022 0207   MCV 94 04/26/2020 1708   MCH 31.2 09/17/2022 0207   MCHC 32.9 09/17/2022 0207   RDW 14.7 09/17/2022 0207   RDW 12.8 04/26/2020 1708   LYMPHSABS 1.1 09/17/2022 0207   LYMPHSABS 1.6 04/26/2020 1708   MONOABS 0.8 09/17/2022 0207   EOSABS 0.0 09/17/2022 0207   EOSABS 0.0 04/26/2020 1708   BASOSABS 0.1 09/17/2022 0207   BASOSABS 0.0 04/26/2020 1708  HEPATIC Function Panel Recent Labs    09/16/22 1929 09/17/22 0207  PROT 7.1 6.9  ALBUMIN 3.2* 3.1*  AST 48* 51*  ALT 59* 59*  ALKPHOS 110 106   HEMOGLOBIN A1C Lab Results  Component Value Date   MPG 126 (H) 07/18/2014   CARDIAC ENZYMES Lab Results  Component Value Date   TROPONINI <0.30 03/24/2012   BNP No results for input(s): "PROBNP" in the last 8760 hours. TSH Recent Labs    09/16/22 1929  TSH 1.792   CHOLESTEROL Recent Labs    06/14/22 1505 09/17/22 0207  CHOL 180 161    Scheduled Meds:  apixaban  2.5 mg Oral BID   atorvastatin  20 mg Oral QPM   cycloSPORINE  1 drop Both Eyes BID   digoxin  0.125 mg Oral Daily   DULoxetine  20 mg Oral Daily   furosemide  40 mg Intravenous Once   gabapentin  100 mg Oral QHS   levETIRAcetam  500 mg Oral  BID   metoprolol tartrate  25 mg Oral BID   primidone  250 mg Oral BID   Continuous Infusions:  diltiazem (CARDIZEM) infusion 10 mg/hr (09/17/22 0238)   PRN Meds:.ALPRAZolam, diclofenac Sodium  Assessment/Plan: Atrial fibrillation with RVR, improving rate control. Acute diastolic left heart failure HTN HLD Diet controlled type 2 DM Old strokes Parkinson's disease Obesity Arthritis  Plan: IV lasix as tolerated. Continue metoprolol and diltiazem. Will discontinue digoxin for LVH.   LOS: 0 days   Time spent including chart review, lab review, examination, discussion with patient/Son/Tech : 30 min   Dixie Dials  MD  09/17/2022, 9:38 AM

## 2022-09-17 NOTE — Assessment & Plan Note (Addendum)
Stable. Still have head tremors with is present at baseline. Home medication includes Xanax and primidone -Holding home Xanax -f/u neuro outpatient

## 2022-09-17 NOTE — Progress Notes (Incomplete)
Echocardiogram 2D Echocardiogram has been performed.  Ronny Flurry 09/17/2022, 9:52 AM

## 2022-09-17 NOTE — Plan of Care (Signed)
  Problem: Education: Goal: Knowledge of General Education information will improve Description: Including pain rating scale, medication(s)/side effects and non-pharmacologic comfort measures Outcome: Progressing   Problem: Health Behavior/Discharge Planning: Goal: Ability to manage health-related needs will improve Outcome: Progressing   Problem: Clinical Measurements: Goal: Ability to maintain clinical measurements within normal limits will improve Outcome: Progressing Goal: Respiratory complications will improve Outcome: Progressing   Problem: Activity: Goal: Risk for activity intolerance will decrease Outcome: Progressing   Problem: Nutrition: Goal: Adequate nutrition will be maintained Outcome: Progressing   Problem: Pain Managment: Goal: General experience of comfort will improve Outcome: Progressing   

## 2022-09-17 NOTE — Assessment & Plan Note (Addendum)
Patient on admission was found to have new onset A-fib with RVR.  Received TEE/DCCV on 10/31.  Has since cardioverted and remained stable. - Cardiology following, appreciate recs - Now on p.o. medications: - Diltiazem 30mg  QID - Lopressor 50 mg TID - Amiodarone 200 mg twice daily x7 days > qd on 11/6 - Continue Cardiac monitoring - Holding Eliquis for GI bleed per GI

## 2022-09-17 NOTE — Progress Notes (Addendum)
Daily Progress Note Intern Pager: [phone]  Patient name: [name] [name] Medical record number: [id number] Date of birth: [birth year] Age: 88 y.o. Gender: female  Primary Care Provider: Zola Button, MD Consultants: Cardiology Code Status: full  Pt Overview and Major Events to Date:  10/23-admitted  Assessment and Plan:  [name] [name] is a 88 y.o. female presenting with leg weakness and shortness of breath, found to be volume overloaded in the setting of acute on chronic CHF exacerbation and new onset A-fib with RVR.  * Atrial fibrillation with RVR ([place]) New onset.  Rate controlled this AM.  CHA2DS2VASc score 8.  - Cardiology following, appreciate care and recommendations -Metoprolol for rate control as well as IV diltiazem gtt - IV Lasix as tolerated, per cardiology - P.o. 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  - PT/OT -L Hip XR -Consider head imaging if acute deficits   Acute on chronic diastolic heart failure ([place]) Presenting with SOB and lower extremity weakness, with new oxygen requirements, 2L [place] (no home oxygen). Appears hypervolemic on exam with CXR showing bilateral pleural effusions, likely secondary to a-fib with RVR. COVID/Flu negative. BNP 465, troponin 6.  Anticipate improvement with rate control and diuresis.  - Cardiology following, appreciate recs - Strict I's and O's, daily weight - Lasix as tolerated per cards  Essential tremor Reports Xanax use as needed for tremors, no relief per patient.  Also on primidone; however, this notably has a drug interaction with Eliquis. -DC Xanax -Consider stopping/switching primidone   FEN/GI: heart healthy carb modified PPx: eliquis Dispo:Pending PT recommendations  pending clinical improvement . Barriers include afib rvr, chf.   Subjective:  Seen after rounds.  Denies acute  concerns.  Endorses continued left hip/leg pain.  Denies CP/SOB, abdominal pain.  Discussed Xanax, patient reports that she uses it for tremors but it has not been helping her.  She is okay with not taking it while in the hospital.  Objective: Temp:  [97.4 F (36.3 C)-98.4 F (36.9 C)] 97.4 F (36.3 C) (10/24 0641) Pulse Rate:  [54-159] 150 (10/24 1320) Resp:  [16-44] 24 (10/24 1245) BP: (111-145)/(66-113) 125/82 (10/24 1245) SpO2:  [90 %-100 %] 93 % (10/24 1245) Weight:  [68 kg] 68 kg (10/23 1825) Physical Exam: General: NAD, pleasant Cardiovascular: Mildly tachycardic, irregularly irregular rhythm Respiratory: CTAB anteriorly  Abdomen: Soft, NT/ND Extremities: 1+ bilateral lower extremity pitting edema Tenderness to palpation of left hip, mildly restricted ROM due to pain  Laboratory: Most recent CBC Lab Results  Component Value Date   WBC 6.7 09/17/2022   HGB 14.6 09/17/2022   HCT 44.4 09/17/2022   MCV 94.9 09/17/2022   PLT 96 (L) 09/17/2022   Most recent BMP    Latest Ref Rng & Units 09/17/2022    2:07 AM  BMP  Glucose 70 - 99 mg/dL 122   BUN 8 - 23 mg/dL 18   Creatinine 0.44 - 1.00 mg/dL 0.84   Sodium 135 - 145 mmol/L 139   Potassium 3.5 - 5.1 mmol/L 4.1   Chloride 98 - 111 mmol/L 100   CO2 22 - 32 mmol/L 27   Calcium 8.9 - 10.3 mg/dL 8.6    Lipid Panel     Component Value Date/Time   CHOL 161 09/17/2022 0207   CHOL 180 06/14/2022 1505   TRIG 77 09/17/2022 0207  HDL 67 09/17/2022 0207   HDL 79 06/14/2022 1505   CHOLHDL 2.4 09/17/2022 0207   VLDL 15 09/17/2022 0207   LDLCALC 79 09/17/2022 0207   LDLCALC 90 06/14/2022 1505   LABVLDL 11 06/14/2022 1505    ECHO  1. Left ventricular ejection fraction, by estimation, is 55 to 60%. The  left ventricle has normal function. The left ventricle has no regional  wall motion abnormalities. Left ventricular diastolic parameters are  indeterminate.   2. Right ventricular systolic function is low normal. The  right  ventricular size is normal.   3. Left atrial size was mildly dilated.   4. Right atrial size was mildly dilated.   5. The mitral valve is degenerative. Mild mitral valve regurgitation.   6. Tricuspid valve regurgitation is moderate.   7. The aortic valve is tricuspid. There is mild calcification of the  aortic valve. There is mild thickening of the aortic valve. Aortic valve  regurgitation is mild.   8. There is mild (Grade II) atheroma plaque involving the ascending aorta  and aortic root.   9. The inferior vena cava is dilated in size with <50% respiratory  variability, suggesting right atrial pressure of 15 mmHg.    [name] Albino, MD 09/17/2022, 1:47 PM  PGY-1, [place] Intern pager: [phone], text pages welcome Secure chat group [place]

## 2022-09-17 NOTE — Progress Notes (Signed)
   09/17/22 1955  Assess: MEWS Score  Temp 98.2 F (36.8 C)  BP 118/74  MAP (mmHg) 88  Pulse Rate (!) 112  ECG Heart Rate (!) 112  Resp (!) 24  Level of Consciousness Alert  SpO2 92 %  O2 Device Nasal Cannula  O2 Flow Rate (L/min) 4 L/min  Assess: MEWS Score  MEWS Temp 0  MEWS Systolic 0  MEWS Pulse 2  MEWS RR 1  MEWS LOC 0  MEWS Score 3  MEWS Score Color Yellow  Assess: if the MEWS score is Yellow or Red  Were vital signs taken at a resting state? Yes  Focused Assessment No change from prior assessment  Does the patient meet 2 or more of the SIRS criteria? No  MEWS guidelines implemented *See Row Information* Yes  Treat  MEWS Interventions Escalated (See documentation below)  Pain Scale 0-10  Pain Score 0  Take Vital Signs  Increase Vital Sign Frequency  Yellow: Q 2hr X 2 then Q 4hr X 2, if remains yellow, continue Q 4hrs  Escalate  MEWS: Escalate Yellow: discuss with charge nurse/RN and consider discussing with provider and RRT  Notify: Charge Nurse/RN  Name of Charge Nurse/RN Notified Rondall Allegra  Date Charge Nurse/RN Notified 09/17/22  Time Charge Nurse/RN Notified 2000  Document  Progress note created (see row info) Yes  Assess: SIRS CRITERIA  SIRS Temperature  0  SIRS Pulse 1  SIRS Respirations  1  SIRS WBC 1  SIRS Score Sum  3

## 2022-09-17 NOTE — ED Notes (Incomplete)
Patient transported to x-ray. ?

## 2022-09-17 NOTE — TOC Benefit Eligibility Note (Signed)
Patient Teacher, English as a foreign language completed.    The patient is currently admitted and upon discharge could be taking Eliquis 5 mg.  The current 30 day co-pay is $4.30.   The patient is insured through [place] [place] Patient Advocate Specialist [place] Patient Advocate Team Direct Number: [phone]  Fax: [phone]

## 2022-09-17 NOTE — Progress Notes (Signed)
PCP social note  Initially presented with leg weakness/giving out found to have new onset atrial fibrillation with RVR.  Cardiology is following, patient remains on diltiazem drip and has been started on metoprolol and digoxin.  Son is now present at bedside this morning.  [name] video interpreter utilized.  This morning, patient reports feeling overall improved.  She reports that over the last 2 months, she has had progressive weakness in her legs which she believes is due to leg heaviness/fluid retention.  She has required assistance getting out of bed which is new for her.  She has been ambulating with a walker at home more consistently for the past 2 months, reports that she feels unsteady without the walker.  She has 3 walkers at home.  She has not had any significant knee pain.  Exam: General: Alert, interactive, NAD Cardiovascular: Irregularly irregular, tachycardic Pulmonary: Faint bibasilar rales, breathing comfortably on nasal cannula Extremities: Warm and well perfused, bilateral 1-2+ pitting edema  Patient has been needing more assistance with mobility over the past 2 months.  I had sent her to outpatient PT for her bilateral knee pain which thankfully is improving.  I wonder if leg swelling is a contributor to her decreased mobility and may need dose adjustment of her loop diuretic.  I think she would benefit from PT and OT while hospitalized.  Appreciate excellent care of FMTS and cardiology

## 2022-09-17 NOTE — Telephone Encounter (Signed)
Pharmacy Patient Advocate Encounter  Insurance verification completed.    The patient is insured through [employer] Medicare Part D   The patient is currently admitted and ran test claims for the following: Eliquis .  Copays and coinsurance results were relayed to Inpatient clinical team.  

## 2022-09-17 NOTE — ED Notes (Signed)
Notified admitting that pt's cardizem gtt shows that it went offline at 0851, unbeknownst to this RN. Pt still afib w/ rate 90-120. Per admitting, restart cardizem gtt.

## 2022-09-18 DIAGNOSIS — I4891 Unspecified atrial fibrillation: Secondary | ICD-10-CM | POA: Diagnosis not present

## 2022-09-18 LAB — BASIC METABOLIC PANEL
Anion gap: 8 (ref 5–15)
BUN: 19 mg/dL (ref 8–23)
CO2: 30 mmol/L (ref 22–32)
Calcium: 8.5 mg/dL — ABNORMAL LOW (ref 8.9–10.3)
Chloride: 99 mmol/L (ref 98–111)
Creatinine, Ser: 0.7 mg/dL (ref 0.44–1.00)
GFR, Estimated: >60 mL/min (ref >60)
Glucose, Bld: 127 mg/dL — ABNORMAL HIGH (ref 70–99)
Potassium: 4 mmol/L (ref 3.5–5.1)
Sodium: 137 mmol/L (ref 135–145)

## 2022-09-18 LAB — MAGNESIUM: Magnesium: 2 mg/dL (ref 1.7–2.4)

## 2022-09-18 MED ORDER — FUROSEMIDE 10 MG/ML IJ SOLN
40.0000 mg | Freq: Once | INTRAMUSCULAR | Status: AC
  Administered 2022-09-18: 40 mg via INTRAVENOUS
  Filled 2022-09-18: qty 4

## 2022-09-18 MED ORDER — POLYETHYLENE GLYCOL 3350 17 G PO PACK
17.0000 g | PACK | Freq: Every day | ORAL | Status: DC
  Administered 2022-09-19: 17 g via ORAL
  Filled 2022-09-18 (×2): qty 1

## 2022-09-18 MED ORDER — METOPROLOL TARTRATE 50 MG PO TABS
50.0000 mg | ORAL_TABLET | Freq: Once | ORAL | Status: AC
  Administered 2022-09-18: 50 mg via ORAL
  Filled 2022-09-18: qty 1

## 2022-09-18 MED ORDER — SENNA 8.6 MG PO TABS
1.0000 | ORAL_TABLET | Freq: Every day | ORAL | Status: DC
  Administered 2022-09-18 – 2022-09-21 (×4): 8.6 mg via ORAL
  Filled 2022-09-18 (×5): qty 1

## 2022-09-18 MED ORDER — DILTIAZEM HCL ER COATED BEADS 240 MG PO CP24
240.0000 mg | ORAL_CAPSULE | Freq: Every day | ORAL | Status: DC
  Administered 2022-09-18 – 2022-09-19 (×2): 240 mg via ORAL
  Filled 2022-09-18 (×5): qty 1

## 2022-09-18 MED ORDER — METOPROLOL TARTRATE 25 MG PO TABS
50.0000 mg | ORAL_TABLET | Freq: Two times a day (BID) | ORAL | Status: DC
  Administered 2022-09-18 – 2022-09-20 (×5): 50 mg via ORAL
  Filled 2022-09-18: qty 1
  Filled 2022-09-18 (×2): qty 2
  Filled 2022-09-18 (×2): qty 1

## 2022-09-18 MED ORDER — FUROSEMIDE 10 MG/ML IJ SOLN: 40.0000 mg | Freq: Once | INTRAMUSCULAR | Status: DC

## 2022-09-18 NOTE — Progress Notes (Signed)
Daily Progress Note Intern Pager: [phone]  Patient name: [name] [name] Medical record number: [id number] Date of birth: 1930/01/11 Age: 88 y.o. Gender: female  Primary Care Provider: Zola Button, MD Consultants: Cardiology Code Status: Full  Pt Overview and Major Events to Date:  10/23-admitted  Assessment and Plan:  [name] [name] is a 88 y.o. female presenting with leg weakness and shortness of breath, found to be volume overloaded in the setting of acute on chronic CHF exacerbation and new onset A-fib with RVR.   * Atrial fibrillation with RVR ([place]) New onset.  Heart rate mostly controlled but continues to have some intermittent tachycardia.  CHA2DS2VASc score 8.  - Cardiology following, appreciate care and recommendations -Metoprolol for rate control as well as IV diltiazem gtt - IV Lasix as tolerated, per cardiology-40 mg IV today - P.o. 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  Left hip x-ray without acute fracture. - PT/OT -Consider head imaging if acute deficits   Acute on chronic diastolic heart failure ([facility]) Continued oxygen requirement.  Anticipate improvement with rate control and diuresis.  - Cardiology following, appreciate recs - Strict I's and O's, daily weight - Lasix as tolerated per cards  Essential tremor Reports Xanax use as needed for tremors, no relief per patient.  Also on primidone; however, this notably has a drug interaction with Eliquis. -DC Xanax -Dc'd primidone due to drug interaction w/ Eliquis -f/u neuro outpatient       FEN/GI: Heart healthy carb modified, miralax/senna PPx: Eliquis Dispo:SNF pending clinical improvement . Barriers include A-fib, IV diltiazem, oxygen requirement.   Subjective:  Seen this morning with son at bedside.  No acute concerns, reports that her breathing feels a  little bit better from yesterday.  Endorses continued pain in left hip.  Denies CP, abdominal pain.  Reports that she has not had a bowel movement in a couple of days.  Objective: Temp:  [97.5 F (36.4 C)-98.2 F (36.8 C)] 97.5 F (36.4 C) (10/25 1110) Pulse Rate:  [77-150] 90 (10/25 1110) Resp:  [18-33] 20 (10/25 1110) BP: (101-125)/(65-86) 121/65 (10/25 1110) SpO2:  [92 %-97 %] 92 % (10/25 1110) Weight:  [67.2 kg-68 kg] 67.2 kg (10/25 0500) Physical Exam: General: NAD, resting in bed Cardiovascular: Mildly tachycardic, irregularly irregular rhythm Respiratory: Anterior lung fields clear bilateral Abdomen: Soft, NT/ND Extremities: 1+ bilateral lower extremity edema  Laboratory: Most recent CBC Lab Results  Component Value Date   WBC 6.7 09/17/2022   HGB 14.6 09/17/2022   HCT 44.4 09/17/2022   MCV 94.9 09/17/2022   PLT 96 (L) 09/17/2022   Most recent BMP    Latest Ref Rng & Units 09/18/2022    1:15 AM  BMP  Glucose 70 - 99 mg/dL 127   BUN 8 - 23 mg/dL 19   Creatinine 0.44 - 1.00 mg/dL 0.70   Sodium 135 - 145 mmol/L 137   Potassium 3.5 - 5.1 mmol/L 4.0   Chloride 98 - 111 mmol/L 99   CO2 22 - 32 mmol/L 30   Calcium 8.9 - 10.3 mg/dL 8.5    Magnesium 2.0  Imaging/Diagnostic Tests:  L hip x-ray IMPRESSION: No evidence of acute fracture or joint dislocation. Cross-sectional imaging could provide more sensitive evaluation if clinically warranted.  August Albino, MD 09/18/2022, 11:39 AM  PGY-1, [place] Intern pager:  [phone], text pages welcome Secure chat group [place]

## 2022-09-18 NOTE — Discharge Instructions (Addendum)
Dear Gregary Signs,  Thank you for letting us participate in your care. You were hospitalized for fast heart rate and diagnosed with Atrial fibrillation with RVR ([place]). You were treated with multiple medications for your heart. You were also treated for blood clots, a bleed in your gastrointestinal system, fluid on your lungs, and constipation.  POST-HOSPITAL & CARE INSTRUCTIONS Be sure to take your medications as listed in this packet. Please let us know if you have any questions. You will go to the inpatient rehabilitation facility here at [facility] to help regain strength. Go to your follow up appointments (listed below)  DOCTOR'S APPOINTMENT   Future Appointments  Date Time Provider [place]  03/11/2023  2:15 PM Star Age, MD [facility] None    Take care and be well!  [facility]  [place], [place] [address] [phone]  Information on my medicine - ELIQUIS (apixaban)  Why was Eliquis prescribed for you? Eliquis was prescribed for you to reduce the risk of a blood clot forming that can cause a stroke if you have a medical condition called atrial fibrillation (a type of irregular heartbeat).  What do You need to know about Eliquis ? Take your Eliquis TWICE DAILY - one tablet in the morning and one tablet in the evening with or without food. If you have difficulty swallowing the tablet whole please discuss with your pharmacist how to take the medication safely.  Take Eliquis exactly as prescribed by your doctor and DO NOT stop taking Eliquis without talking to the doctor who prescribed the medication.  Stopping may increase your risk of developing a stroke.  Refill your prescription before you run out.  After discharge, you should have regular check-up appointments with your healthcare provider that is prescribing your Eliquis.  In the future your dose may need to be changed if  your kidney function or weight changes by a significant amount or as you get older.  What do you do if you miss a dose? If you miss a dose, take it as soon as you remember on the same day and resume taking twice daily.  Do not take more than one dose of ELIQUIS at the same time to make up a missed dose.  Important Safety Information A possible side effect of Eliquis is bleeding. You should call your healthcare provider right away if you experience any of the following: Bleeding from an injury or your nose that does not stop. Unusual colored urine (red or dark brown) or unusual colored stools (red or black). Unusual bruising for unknown reasons. A serious fall or if you hit your head (even if there is no bleeding).  Some medicines may interact with Eliquis and might increase your risk of bleeding or clotting while on Eliquis. To help avoid this, consult your healthcare provider or pharmacist prior to using any new prescription or non-prescription medications, including herbals, vitamins, non-steroidal anti-inflammatory drugs (NSAIDs) and supplements.  This website has more information on Eliquis (apixaban): http://www.eliquis.com/eliquis/home

## 2022-09-18 NOTE — Evaluation (Signed)
Occupational Therapy Evaluation Patient Details Name: [name] [name] MRN: [id number] DOB: 1930-01-28 Today's Date: 09/18/2022   History of Present Illness 88 y.o. female presenting with leg weakness and shortness of breath, found to be volume overloaded in the setting of acute on chronic CHF exacerbation and new onset A-fib with RVR. Admitted 09/16/22.   Clinical Impression   Pt was evaluated s/p the above admission list, son present and providing interpretation throughout. Pt was generally mod I PTA with use of RW, supplemental O2 as needed, and an aide 1x/wk to assist with ADL/IADLs. Upon evaluation pt had functional limitations due to generalized weakness, poor activity tolerance, decreased balance, and communication barrier. Overall she required mod A for bed mobility, min A +2 to stand and mod  A +2 with RW to take pivotal steps to the chair. Due to deficits listed below she requires up to max A for ADLs. Ot to continue to follow acutely. Recommend d/c to SNF for continued therapies prior to d/c home, discussed with pt's son who stated he will talk to the rest of the family.       Recommendations for follow up therapy are one component of a multi-disciplinary discharge planning process, led by the attending physician.  Recommendations may be updated based on patient status, additional functional criteria and insurance authorization.   Follow Up Recommendations  Skilled nursing-short term rehab (<3 hours/day) (pending acute progression)    Assistance Recommended at Discharge PRN (Pt's son works 11-6:30 Tues/Thurs)  Patient can return home with the following A lot of help with walking and/or transfers;A lot of help with bathing/dressing/bathroom;Assistance with cooking/housework;Direct supervision/assist for financial management;Assistance with feeding;Direct supervision/assist for medications management;Assist for transportation;Help with stairs or ramp for entrance    Functional Status  Assessment  Patient has had a recent decline in their functional status and demonstrates the ability to make significant improvements in function in a reasonable and predictable amount of time.  Equipment Recommendations  None recommended by OT       Precautions / Restrictions Precautions Precautions: Fall Precaution Comments: watch HR Restrictions Weight Bearing Restrictions: No      Mobility Bed Mobility Overal bed mobility: Needs Assistance Bed Mobility: Supine to Sit     Supine to sit: Mod assist     General bed mobility comments: +increased time and cues    Transfers Overall transfer level: Needs assistance Equipment used: Rolling walker (2 wheels) Transfers: Sit to/from Stand, Bed to chair/wheelchair/BSC Sit to Stand: Min assist, +2 physical assistance, +2 safety/equipment     Step pivot transfers: Mod assist, +2 physical assistance, +2 safety/equipment     General transfer comment: min A +2 to rise, mod A +2 to take pivotal steps to the recliner with RW. Cue proivded for posture and sequencing      Balance Overall balance assessment: Needs assistance Sitting-balance support: No upper extremity supported, Feet unsupported Sitting balance-Leahy Scale: Fair Sitting balance - Comments: close min G. 1 posterior LOB   Standing balance support: Bilateral upper extremity supported, During functional activity Standing balance-Leahy Scale: Poor                             ADL either performed or assessed with clinical judgement   ADL Overall ADL's : Needs assistance/impaired Eating/Feeding: Minimal assistance;Sitting   Grooming: Minimal assistance;Sitting   Upper Body Bathing: Moderate assistance;Sitting   Lower Body Bathing: Maximal assistance;Sit to/from stand   Upper Body Dressing : Minimal  assistance;Sitting   Lower Body Dressing: Maximal assistance;Sit to/from stand   Toilet Transfer: Moderate assistance;+2 for physical assistance;+2 for  safety/equipment;Rolling walker (2 wheels);Stand-pivot;BSC/3in1 Statistician Details (indicate cue type and reason): SP only, activity tolerance. cues. Toileting- Clothing Manipulation and Hygiene: Maximal assistance;Sit to/from stand       Functional mobility during ADLs: Moderate assistance;+2 for physical assistance;+2 for safety/equipment General ADL Comments: significantly limited by activity tolerance and weakness     Vision Baseline Vision/History: 0 No visual deficits Vision Assessment?: No apparent visual deficits       Hand Dominance Right   Extremity/Trunk Assessment Upper Extremity Assessment Upper Extremity Assessment: Generalized weakness   Lower Extremity Assessment Lower Extremity Assessment: Generalized weakness   Cervical / Trunk Assessment Cervical / Trunk Assessment: Kyphotic   Communication Communication Communication: Prefers language other than English   Cognition Arousal/Alertness: Awake/alert Behavior During Therapy: Flat affect Overall Cognitive Status: Difficult to assess                                 General Comments: son present and providing interpretation. Pt able to answer some simple questions in english. Carrying full conversation with son     General Comments  VSS on 3L. HR to 168 during activity. HR jumping 100s-140s throughout     Home Living Family/patient expects to be discharged to:: Private residence Living Arrangements: Children Available Help at Discharge: Family;Available PRN/intermittently Type of Home: House Home Access: Level entry     Home Layout: One level (1 step to the dining room)     Bathroom Shower/Tub: Producer, television/film/video: Standard Bathroom Accessibility: Yes   Home Equipment: Agricultural consultant (2 wheels);Other (comment) (O2)   Additional Comments: Pt's son present and interpreting. Pt on supplamental O2 at home intermittently (only with SOB)      Prior  Functioning/Environment Prior Level of Function : Needs assist             Mobility Comments: RW for mobility, working with OP PT ADLs Comments: aide assisting 1x/wk for bathing        OT Problem List: Decreased strength;Decreased range of motion;Decreased activity tolerance;Impaired balance (sitting and/or standing);Decreased safety awareness;Decreased knowledge of use of DME or AE;Decreased knowledge of precautions;Cardiopulmonary status limiting activity      OT Treatment/Interventions: Self-care/ADL training;Therapeutic exercise;DME and/or AE instruction;Therapeutic activities;Patient/family education;Balance training;Energy conservation    OT Goals(Current goals can be found in the care plan section) Acute Rehab OT Goals Patient Stated Goal: home per son OT Goal Formulation: With patient Time For Goal Achievement: 10/02/22 Potential to Achieve Goals: Good ADL Goals Pt Will Perform Grooming: with supervision;sitting Pt Will Perform Upper Body Dressing: with supervision;sitting Pt Will Perform Lower Body Dressing: with min assist;sit to/from stand Pt Will Transfer to Toilet: with min assist;ambulating Additional ADL Goal #1: Pt will complete bed mobility with superivsion A as a precursor to ADLs  OT Frequency: Min 2X/week    Co-evaluation PT/OT/SLP Co-Evaluation/Treatment: Yes Reason for Co-Treatment: Complexity of the patient's impairments (multi-system involvement);For patient/therapist safety;To address functional/ADL transfers   OT goals addressed during session: ADL's and self-care      AM-PAC OT "6 Clicks" Daily Activity     Outcome Measure Help from another person eating meals?: A Little Help from another person taking care of personal grooming?: A Little Help from another person toileting, which includes using toliet, bedpan, or urinal?: A Lot Help from another person bathing (  including washing, rinsing, drying)?: A Lot Help from another person to put on and  taking off regular upper body clothing?: A Little Help from another person to put on and taking off regular lower body clothing?: A Lot 6 Click Score: 15   End of Session Equipment Utilized During Treatment: Gait belt;Rolling walker (2 wheels);Oxygen Nurse Communication: Mobility status  Activity Tolerance: Patient tolerated treatment well Patient left: in chair;with call bell/phone within reach;with chair alarm set;with family/visitor present  OT Visit Diagnosis: Unsteadiness on feet (R26.81);Other abnormalities of gait and mobility (R26.89);Muscle weakness (generalized) (M62.81);Pain                Time: 0940-1005 OT Time Calculation (min): 25 min Charges:  OT General Charges $OT Visit: 1 Visit OT Evaluation $OT Eval Moderate Complexity: 1 Mod   Jathniel Smeltzer D Causey 09/18/2022, 10:22 AM

## 2022-09-18 NOTE — TOC Initial Note (Signed)
Transition of Care [facility]) - Initial/Assessment Note    Patient Details  Name: [name] [name] MRN: [id number] Date of Birth: 23-Mar-1930  Transition of Care [facility]) CM/SW Contact:    Bethann Berkshire, [place] Phone Number: 09/18/2022, 12:09 PM  Clinical Narrative:                  CSW met in pt room to discuss disposition. Pt son is present and able to interpret english to farsi. Grandaughter(Shiva) and daughter/granduaghter in-law also present. Son explains that he and pt live at home together at address listed in chart. Other family members live in [place] area. CSW explained SNF recommendation. Son is agreeable to SNF workup. CSW explained work-up process and medicare coverage for SNF. Fl2 completed and bed requests faxed in hub.   Expected Discharge Plan: Skilled Nursing Facility Barriers to Discharge: Continued Medical Work up   Patient Goals and CMS Choice        Expected Discharge Plan and Services Expected Discharge Plan: [place] arrangements for the past 2 months: Single Family Home                                      Prior Living Arrangements/Services Living arrangements for the past 2 months: Single Family Home Lives with:: Adult Children Patient language and need for interpreter reviewed:: Yes        Need for Family Participation in Patient Care: Yes (Comment) Care giver support system in place?: Yes (comment)   Criminal Activity/Legal Involvement Pertinent to Current Situation/Hospitalization: No - Comment as needed  Activities of Daily Living      Permission Sought/Granted                  Emotional Assessment Appearance:: Appears stated age Attitude/Demeanor/Rapport:  (Calm) Affect (typically observed): Appropriate Orientation: : Oriented to Self, Oriented to Place, Oriented to  Time, Oriented to Situation Alcohol / Substance Use: Not Applicable Psych Involvement: No (comment)  Admission diagnosis:   Palpitations [R00.2] Shortness of breath [R06.02] Atrial fibrillation with RVR ([place]) [I48.91] Chest pain, unspecified type [R07.9] Patient Active Problem List   Diagnosis Date Noted   Left hip pain 09/17/2022   Atrial fibrillation with RVR ([place]) 09/16/2022   Acute on chronic diastolic heart failure ([place]) 09/16/2022   Chronic pain of left knee 07/26/2022   Urinary incontinence 06/16/2022   Urinary frequency 12/06/2020   History of CVA (cerebrovascular accident) 04/26/2019   CHF (congestive heart failure), NYHA class III, chronic, diastolic ([place]) 16/94/5038   Hypoxemia requiring supplemental oxygen 11/10/2018   Fatigue 11/10/2018   Osteopenia of multiple sites 11/10/2018   Back pain 10/10/2016   Seizures ([place]) 08/06/2014   Hemiplegia, unspecified, affecting dominant side 08/05/2014   Right sided weakness 08/05/2014   CVA (cerebral vascular accident) ([place]) 07/21/2014   HTN (hypertension) 07/18/2014   HLD (hyperlipidemia) 07/18/2014   Thrombocytopenia ([place]) 07/18/2014   Essential tremor 10/27/2013   Obesity, unspecified 10/27/2013   History of peripheral neuropathy 10/27/2013   PCP:  Zola Button, MD Pharmacy:   [id number] [place] [place] [place] [address]Phone: [phone]:: [phone]    Social Determinants of Health (SDOH) Interventions    Readmission Risk Interventions     No data to display

## 2022-09-18 NOTE — Progress Notes (Signed)
Patient heart rate maintaining in 110-140's. Spoke with Dr. Doylene Canard to advise of heart rate. RN given a verbal order for one time does metoprolol 50 mg.

## 2022-09-18 NOTE — Progress Notes (Signed)
Physical Therapy Treatment Patient Details Name: [name] [name] MRN: [id number] DOB: [birth year] Today's Date: 09/18/2022   History of Present Illness Pt is an 88 y.o. female admitted 09/16/22 with worsening BLE weakness, SOB. Workup for volume overload secondary to CHF exacerbation, new onset afib with RVR. PMH includes HTN, DVT, DM, essential tremor, stroke (2015), anxiety.   PT Comments    Pt slowly progressing with mobility. Pt requires min-modA+2 for standing and pre-gait activity with RW, quick to fatigue requiring prolonged seated rest break to recover; DOE 3/4 with minimal activity. Pt remains limited by generalized weakness, decreased activity tolerance, and impaired balance strategies/postural reactions. Pt's son present and supportive; discussed recommendations for SNF-level therapies to maximize functional mobility and independence prior to return home, which they are unsure but willing to consider. If pt to return home, would need increased assist available.  SpO2 95% on 4L O2 [place] HR 100s-140s, briefly up to 168 with standing BP 118/76    Recommendations for follow up therapy are one component of a multi-disciplinary discharge planning process, led by the attending physician.  Recommendations may be updated based on patient status, additional functional criteria and insurance authorization.  Follow Up Recommendations  Skilled nursing-short term rehab (<3 hours/day) Can patient physically be transported by private vehicle: Yes   Assistance Recommended at Discharge Frequent or constant Supervision/Assistance  Patient can return home with the following A lot of help with walking and/or transfers;A lot of help with bathing/dressing/bathroom;Assistance with cooking/housework;Assist for transportation   Equipment Recommendations  None recommended by PT    Recommendations for Other Services  Mobility Specialist     Precautions / Restrictions Precautions Precautions: Fall;Other  (comment) Precaution Comments: Watch HR; wears supplemental O2 PRN baseline Restrictions Weight Bearing Restrictions: No     Mobility  Bed Mobility Overal bed mobility: Needs Assistance Bed Mobility: Supine to Sit     Supine to sit: Mod assist, HOB elevated     General bed mobility comments: good initiation of movement with cues, minA for trunk elevation to come to long sitting from elevated HOB, modA for scooting hips to EOB    Transfers Overall transfer level: Needs assistance Equipment used: Rolling walker (2 wheels) Transfers: Sit to/from Stand, Bed to chair/wheelchair/BSC Sit to Stand: Min assist, +2 physical assistance, +2 safety/equipment   Step pivot transfers: Mod assist, +2 physical assistance, +2 safety/equipment       General transfer comment: minA+2 for trunk elevation and stability standing from EOB and recliner, cues for hand placement and sequencing; modA for pivotal steps to recliner with RW, quick to fatigue needing to sit    Ambulation/Gait             Pre-gait activities: pivotal steps to recliner then prolonged seated rest break, additional stand and marching in place with RW; pt with increased forward flexion and posterior lean with fatigue requiring cues for upright posture; quick to fatigue needing seated rest break; DOE 3/4     Stairs             Wheelchair Mobility    Modified Rankin (Stroke Patients Only)       Balance Overall balance assessment: Needs assistance Sitting-balance support: No upper extremity supported, Feet unsupported Sitting balance-Leahy Scale: Fair     Standing balance support: Bilateral upper extremity supported, During functional activity Standing balance-Leahy Scale: Poor  Cognition Arousal/Alertness: Awake/alert Behavior During Therapy: Flat affect Overall Cognitive Status: Difficult to assess Area of Impairment: Following commands, Problem solving                        Following Commands: Follows one step commands consistently     Problem Solving: Requires verbal cues General Comments: pt able to communicate appropriately via basic English; carrying full conversation with son in native language, son assisting with interpretation. pt following simple commands well with some increased cues        Exercises      General Comments General comments (skin integrity, edema, etc.): SpO2 94% on 4L O2 [place]. HR briefly up to 168 with activity, maintaining 100s-140s afib with activity. BP 118/76. pt's son present and supportive - discussed d/c recommendation for SNF-level therapies with pt and son, they are willing to consider      Pertinent Vitals/Pain Pain Assessment Pain Assessment: Faces Faces Pain Scale: Hurts a little bit Pain Location: back, BLEs Pain Descriptors / Indicators: Discomfort Pain Intervention(s): Monitored during session, Repositioned    Home Living Family/patient expects to be discharged to:: Private residence Living Arrangements: Children Available Help at Discharge: Family;Available PRN/intermittently Type of Home: House Home Access: Level entry       Home Layout: One level (1 step to the dining room) Home Equipment: Conservation officer, nature (2 wheels);Other (comment) (O2) Additional Comments: Pt's son present and interpreting. Pt on supplamental O2 at home intermittently (only with SOB)    Prior Function            PT Goals (current goals can now be found in the care plan section) Progress towards PT goals: Progressing toward goals    Frequency    Min 3X/week      PT Plan Current plan remains appropriate    Co-evaluation PT/OT/SLP Co-Evaluation/Treatment: Yes Reason for Co-Treatment: For patient/therapist safety;To address functional/ADL transfers PT goals addressed during session: Mobility/safety with mobility;Balance;Proper use of DME OT goals addressed during session: ADL's and self-care       AM-PAC PT "6 Clicks" Mobility   Outcome Measure  Help needed turning from your back to your side while in a flat bed without using bedrails?: A Lot Help needed moving from lying on your back to sitting on the side of a flat bed without using bedrails?: A Lot Help needed moving to and from a bed to a chair (including a wheelchair)?: A Lot Help needed standing up from a chair using your arms (e.g., wheelchair or bedside chair)?: A Lot Help needed to walk in hospital room?: Total Help needed climbing 3-5 steps with a railing? : Total 6 Click Score: 10    End of Session Equipment Utilized During Treatment: Gait belt;Oxygen Activity Tolerance: Patient tolerated treatment well;Patient limited by fatigue Patient left: in chair;with call bell/phone within reach;with chair alarm set;with family/visitor present Nurse Communication: Mobility status PT Visit Diagnosis: Unsteadiness on feet (R26.81);Muscle weakness (generalized) (M62.81);Difficulty in walking, not elsewhere classified (R26.2)     Time: 0940-1005 PT Time Calculation (min) (ACUTE ONLY): 25 min  Charges:  $Therapeutic Activity: 8-22 mins                     Mabeline Caras, PT, DPT Acute Rehabilitation Services  Personal: [facility] Office: [place] 09/18/2022, 11:35 AM

## 2022-09-18 NOTE — [place] FL2 (Signed)
[place] MEDICAID FL2 LEVEL OF CARE SCREENING TOOL     IDENTIFICATION  Patient Name: [name] [name] Birthdate: Oct 13, 1930 Sex: female Admission Date (Current Location): 09/16/2022  [facility] and [place] Number:  Herbalist and Address:  [facility] [place] [address], [place],  [address]      Provider Number: [id number]  Attending Physician Name and Address:  Martyn Malay, MD  Relative Name and Phone Number:  Larey Seat)   [phone] (Work Phone)    Current Level of Care: Hospital Recommended Level of Care: [place] Prior Approval Number:    Date Approved/Denied:   PASRR Number: [id number]  Discharge Plan: SNF    Current Diagnoses: Patient Active Problem List   Diagnosis Date Noted   Left hip pain 09/17/2022   Atrial fibrillation with RVR ([place]) 09/16/2022   Acute on chronic diastolic heart failure ([place]) 09/16/2022   Chronic pain of left knee 07/26/2022   Urinary incontinence 06/16/2022   Urinary frequency 12/06/2020   History of CVA (cerebrovascular accident) 04/26/2019   CHF (congestive heart failure), NYHA class III, chronic, diastolic ([place]) [id number]   Hypoxemia requiring supplemental oxygen 11/10/2018   Fatigue 11/10/2018   Osteopenia of multiple sites 11/10/2018   Back pain 10/10/2016   Seizures ([place]) 08/06/2014   Hemiplegia, unspecified, affecting dominant side 08/05/2014   Right sided weakness 08/05/2014   CVA (cerebral vascular accident) ([place]) 07/21/2014   HTN (hypertension) 07/18/2014   HLD (hyperlipidemia) 07/18/2014   Thrombocytopenia ([place]) 07/18/2014   Essential tremor 10/27/2013   Obesity, unspecified 10/27/2013   History of peripheral neuropathy 10/27/2013    Orientation RESPIRATION BLADDER Height & Weight     Self, Time, Situation, Place  O2 (4LNC) Incontinent Weight: 148 lb 2.4 oz (67.2 kg) Height:  5' (152.4 cm)  BEHAVIORAL SYMPTOMS/MOOD NEUROLOGICAL BOWEL NUTRITION  STATUS      Continent Diet (see d/c summary)  AMBULATORY STATUS COMMUNICATION OF NEEDS Skin   Extensive Assist Verbally Normal                       Personal Care Assistance Level of Assistance  Bathing, Feeding, Dressing Bathing Assistance: Maximum assistance Feeding assistance: Independent Dressing Assistance: Limited assistance     Functional Limitations Info  Sight, Hearing, Speech Sight Info: Adequate Hearing Info: Adequate Speech Info: Adequate    SPECIAL CARE FACTORS FREQUENCY  PT (By licensed PT), OT (By licensed OT)     PT Frequency: 5x/week OT Frequency: 5x/week            Contractures Contractures Info: Not present    Additional Factors Info  Code Status, Allergies Code Status Info: full code Allergies Info: tape           Current Medications (09/18/2022):  This is the current hospital active medication list Current Facility-Administered Medications  Medication Dose Route Frequency Provider Last Rate Last Admin   apixaban (ELIQUIS) tablet 5 mg  5 mg Oral BID Nita Sells, Alejandra, DO   5 mg at 09/18/22 1138   atorvastatin (LIPITOR) tablet 20 mg  20 mg Oral QPM Carrion-Carrero, Margely, MD   20 mg at 09/17/22 2200   cycloSPORINE (RESTASIS) 0.05 % ophthalmic emulsion 1 drop  1 drop Both Eyes BID Carrion-Carrero, Margely, MD   1 drop at 09/18/22 1148   diclofenac Sodium (VOLTAREN) 1 % topical gel 2 g  2 g Topical QID PRN Carrion-Carrero, Caryl Ada, MD       diltiazem (CARDIZEM) 125  mg in dextrose 5% 125 mL (1 mg/mL) infusion  5-15 mg/hr Intravenous Continuous Carrion-Carrero, Margely, MD 10 mL/hr at 09/18/22 0458 10 mg/hr at 09/18/22 0458   DULoxetine (CYMBALTA) DR capsule 20 mg  20 mg Oral Daily Carrion-Carrero, Margely, MD   20 mg at 09/18/22 1148   gabapentin (NEURONTIN) capsule 100 mg  100 mg Oral QHS Carrion-Carrero, Margely, MD   100 mg at 09/17/22 2200   levETIRAcetam (KEPPRA) tablet 500 mg  500 mg Oral BID Carrion-Carrero, Margely, MD   500 mg at  09/18/22 1138   metoprolol tartrate (LOPRESSOR) tablet 25 mg  25 mg Oral BID Dixie Dials, MD   25 mg at 09/18/22 1138   polyethylene glycol (MIRALAX / GLYCOLAX) packet 17 g  17 g Oral Daily Leslie Dales, DO       senna (SENOKOT) tablet 8.6 mg  1 tablet Oral Daily Leslie Dales, DO   8.6 mg at 09/18/22 1138     Discharge Medications: Please see discharge summary for a list of discharge medications.  Relevant Imaging Results:  Relevant Lab Results:   Additional Information SSN [place], [place]

## 2022-09-18 NOTE — Consult Note (Signed)
Ref: Zola Button, MD   Subjective:  Awake, VS stable, Atrial fibrillation continues. Moderate respiratory distress.   Objective:  Vital Signs in the last 24 hours: Temp:  [97.5 F (36.4 C)-97.8 F (36.6 C)] 97.5 F (36.4 C) (10/25 1925) Pulse Rate:  [88-121] 101 (10/25 1733) Cardiac Rhythm: Atrial fibrillation (10/25 0811) Resp:  [18-30] 20 (10/25 1110) BP: (101-121)/(65-83) 102/78 (10/25 1925) SpO2:  [92 %-97 %] 92 % (10/25 1110) Weight:  [67.2 kg] 67.2 kg (10/25 0500)  Physical Exam: BP Readings from Last 1 Encounters:  09/18/22 102/78     Wt Readings from Last 1 Encounters:  09/18/22 67.2 kg    Weight change: -0.04 kg Body mass index is 28.93 kg/m. HEENT: [place]/AT, Eyes-Brown, Conjunctiva-Pink, Sclera-Non-icteric Neck: No JVD, No bruit, Trachea midline. Lungs:  Clearing, Bilateral. Cardiac:  Regular rhythm, normal S1 and S2, no S3. II/VI systolic murmur. Abdomen:  Soft, non-tender. BS present. Extremities:  1 + lower leg edema present. No cyanosis. No clubbing. CNS: AxOx3, Cranial nerves grossly intact, moves all 4 extremities.  Skin: Warm and dry.   Intake/Output from previous day: 10/24 0701 - 10/25 0700 In: 277.6 [I.V.:277.6] Out: 1200 [Urine:1200]    Lab Results: BMET    Component Value Date/Time   NA 137 09/18/2022 0115   NA 139 09/17/2022 0207   NA 139 09/16/2022 1929   NA 141 06/14/2022 1505   NA 141 04/26/2020 1708   NA 141 06/22/2018 1448   K 4.0 09/18/2022 0115   K 4.1 09/17/2022 0207   K 4.4 09/16/2022 1929   CL 99 09/18/2022 0115   CL 100 09/17/2022 0207   CL 98 09/16/2022 1929   CO2 30 09/18/2022 0115   CO2 27 09/17/2022 0207   CO2 27 09/16/2022 1929   GLUCOSE 127 (H) 09/18/2022 0115   GLUCOSE 122 (H) 09/17/2022 0207   GLUCOSE 114 (H) 09/16/2022 1929   BUN 19 09/18/2022 0115   BUN 18 09/17/2022 0207   BUN 20 09/16/2022 1929   BUN 17 06/14/2022 1505   BUN 19 04/26/2020 1708   BUN 14 06/22/2018 1448   CREATININE 0.70 09/18/2022 0115    CREATININE 0.84 09/17/2022 0207   CREATININE 0.97 09/16/2022 1929   CALCIUM 8.5 (L) 09/18/2022 0115   CALCIUM 8.6 (L) 09/17/2022 0207   CALCIUM 8.8 (L) 09/16/2022 1929   GFRNONAA >60 09/18/2022 0115   GFRNONAA >60 09/17/2022 0207   GFRNONAA 56 (L) 09/16/2022 1929   GFRAA 69 04/26/2020 1708   GFRAA >60 06/15/2019 0841   GFRAA >60 11/13/2018 1941   CBC    Component Value Date/Time   WBC 6.7 09/17/2022 0207   RBC 4.68 09/17/2022 0207   HGB 14.6 09/17/2022 0207   HGB 15.2 04/26/2020 1708   HCT 44.4 09/17/2022 0207   HCT 46.5 04/26/2020 1708   PLT 96 (L) 09/17/2022 0207   PLT 102 (L) 04/26/2020 1708   MCV 94.9 09/17/2022 0207   MCV 94 04/26/2020 1708   MCH 31.2 09/17/2022 0207   MCHC 32.9 09/17/2022 0207   RDW 14.7 09/17/2022 0207   RDW 12.8 04/26/2020 1708   LYMPHSABS 1.1 09/17/2022 0207   LYMPHSABS 1.6 04/26/2020 1708   MONOABS 0.8 09/17/2022 0207   EOSABS 0.0 09/17/2022 0207   EOSABS 0.0 04/26/2020 1708   BASOSABS 0.1 09/17/2022 0207   BASOSABS 0.0 04/26/2020 1708   HEPATIC Function Panel Recent Labs    09/16/22 1929 09/17/22 0207  PROT 7.1 6.9  ALBUMIN 3.2* 3.1*  AST 48*  51*  ALT 59* 59*  ALKPHOS 110 106   HEMOGLOBIN A1C Lab Results  Component Value Date   MPG 126 (H) 07/18/2014   CARDIAC ENZYMES Lab Results  Component Value Date   TROPONINI <0.30 03/24/2012   BNP No results for input(s): "PROBNP" in the last 8760 hours. TSH Recent Labs    09/16/22 1929  TSH 1.792   CHOLESTEROL Recent Labs    06/14/22 1505 09/17/22 0207  CHOL 180 161    Scheduled Meds:  apixaban  5 mg Oral BID   atorvastatin  20 mg Oral QPM   cycloSPORINE  1 drop Both Eyes BID   diltiazem  240 mg Oral Daily   DULoxetine  20 mg Oral Daily   gabapentin  100 mg Oral QHS   levETIRAcetam  500 mg Oral BID   metoprolol tartrate  50 mg Oral BID   polyethylene glycol  17 g Oral Daily   senna  1 tablet Oral Daily   Continuous Infusions: PRN Meds:.diclofenac  Sodium  Assessment/Plan: Atrial fibrillation with RVR, improving control Acute diastolic left heart failure HTN HLD Obesity Old strokes Parkinson's disease Arthritis  Plan: Change IV to PO medications. Discussed with patient, son and other grand kids about TEE with cardioversion tomorrow or continue Eliquis for 1 month then do external cardioversion without TEE. She and son choose external cardioversion after 1 month of anticoagulation. Agree with additional diuresis.   LOS: 1 day   Time spent including chart review, lab review, examination, discussion with patient/son/nurse : 30 min   Dixie Dials  MD  09/18/2022, 8:34 PM

## 2022-09-19 DIAGNOSIS — I4891 Unspecified atrial fibrillation: Secondary | ICD-10-CM | POA: Diagnosis not present

## 2022-09-19 LAB — BASIC METABOLIC PANEL
Anion gap: 9 (ref 5–15)
BUN: 21 mg/dL (ref 8–23)
CO2: 31 mmol/L (ref 22–32)
Calcium: 8.3 mg/dL — ABNORMAL LOW (ref 8.9–10.3)
Chloride: 94 mmol/L — ABNORMAL LOW (ref 98–111)
Creatinine, Ser: 0.86 mg/dL (ref 0.44–1.00)
GFR, Estimated: >60 mL/min (ref >60)
Glucose, Bld: 129 mg/dL — ABNORMAL HIGH (ref 70–99)
Potassium: 4.6 mmol/L (ref 3.5–5.1)
Sodium: 134 mmol/L — ABNORMAL LOW (ref 135–145)

## 2022-09-19 LAB — MAGNESIUM: Magnesium: 2.1 mg/dL (ref 1.7–2.4)

## 2022-09-19 MED ORDER — POLYETHYLENE GLYCOL 3350 17 G PO PACK
17.0000 g | PACK | Freq: Two times a day (BID) | ORAL | Status: DC
  Administered 2022-09-21 (×2): 17 g via ORAL
  Filled 2022-09-19 (×4): qty 1

## 2022-09-19 MED ORDER — FUROSEMIDE 10 MG/ML IJ SOLN
60.0000 mg | Freq: Once | INTRAMUSCULAR | Status: AC
  Administered 2022-09-19: 60 mg via INTRAVENOUS
  Filled 2022-09-19: qty 6

## 2022-09-19 MED ORDER — FUROSEMIDE 10 MG/ML IJ SOLN
40.0000 mg | Freq: Once | INTRAMUSCULAR | Status: AC
  Administered 2022-09-19: 40 mg via INTRAVENOUS
  Filled 2022-09-19: qty 4

## 2022-09-19 MED ORDER — AMIODARONE HCL 200 MG PO TABS
200.0000 mg | ORAL_TABLET | Freq: Two times a day (BID) | ORAL | Status: DC
  Administered 2022-09-19 (×2): 200 mg via ORAL
  Filled 2022-09-19 (×2): qty 1

## 2022-09-19 MED ORDER — FUROSEMIDE 10 MG/ML IJ SOLN: 40.0000 mg | Freq: Three times a day (TID) | INTRAMUSCULAR | Status: DC

## 2022-09-19 MED ORDER — ALBUMIN HUMAN 25 % IV SOLN
12.5000 g | Freq: Once | INTRAVENOUS | Status: AC
  Administered 2022-09-19: 12.5 g via INTRAVENOUS
  Filled 2022-09-19: qty 50

## 2022-09-19 MED ORDER — AMIODARONE HCL 200 MG PO TABS: 200.0000 mg | ORAL_TABLET | Freq: Every day | ORAL | Status: DC

## 2022-09-19 NOTE — Progress Notes (Signed)
FMTS Brief Progress Note  S:Patient seen at bedside, awake and in mild distress, mentions that she has some pain on her right thigh. Breathing appears labored. States that she has not had BM yet.   O: BP 105/72 (BP Location: Right Arm)   Pulse (!) 112   Temp 98.2 F (36.8 C) (Oral)   Resp 20   Ht 5' (1.524 m)   Wt 71.8 kg   SpO2 97%   BMI 30.91 kg/m   Physical Exam General: NAD. Awake and conversant. Eyes: Normal conjunctiva, anicteric. Round symmetric pupils. ENT: Hearing grossly intact. No nasal discharge. Neck: Neck is supple. No masses or thyromegaly. Respiratory: Tachypneic with labored breathing. No wheezing, crackles, or ronchi noted.  Skin: Warm and dry. BLE appear normal, no lesions/ulcer/erythema noted. Psych: alert, awake, conversant and cooperative, Appropriate mood and affect. CV: Tachycardic on auscultation. JVD noted below jaw. No lower extremity edema.  A/P: Atrial fibrillation with RVR (HCC) MEWS in red initially this morning (RR documented in the 30s), patient was noted to be tachypnic in room RR upper 22-24s w/ laboured breathing. HR 110-120s.  - Consider increasing metoprolol, discuss w/ Cards  Acute on chronic diastolic heart failure ([place]) Suspect she remains hypervolemic, with some JVD elevation. Output 736mL total yesterday. May need to diurese more.  -Consider ordering CXR to f/u on pleural effusions -Start additional Lasix 40 mg IV   Christene Slates, MD 09/19/2022, 6:23 AM PGY-1, [facility] Night Resident  Please page [phone] with questions.

## 2022-09-19 NOTE — Progress Notes (Signed)
Mobility Specialist Progress Note    09/19/22 1148  Mobility  Activity Transferred from bed to chair  Level of Assistance +2 (takes two people)  Assistive Device Other (Comment) (HHA)  Activity Response Tolerated fair  Mobility Referral Yes  $Mobility charge 1 Mobility   Pt received and agreeable. No complaints. MaxA+2 for stand and pivot. Pt kept her feet together and unable to fully extend legs. Left with call bell in reach and RN present.   Hildred Alamin Mobility Specialist  Secure Chat Only

## 2022-09-19 NOTE — Consult Note (Signed)
Ref: Zola Button, MD   Subjective:  Awake. Respiratory distress continues. Atrial fibrillation with RVR at times continues.  Objective:  Vital Signs in the last 24 hours: Temp:  [97.5 F (36.4 C)-98.2 F (36.8 C)] 98.2 F (36.8 C) (10/26 0607) Pulse Rate:  [76-122] 101 (10/26 0810) Cardiac Rhythm: Atrial fibrillation (10/26 0832) Resp:  [20-23] 23 (10/26 0810) BP: (91-121)/(65-85) 119/75 (10/26 0810) SpO2:  [92 %-97 %] 96 % (10/26 0810) Weight:  [71.8 kg] 71.8 kg (10/26 0307)  Physical Exam: BP Readings from Last 1 Encounters:  09/19/22 119/75     Wt Readings from Last 1 Encounters:  09/19/22 71.8 kg    Weight change: 3.8 kg Body mass index is 30.91 kg/m. HEENT: [place]/AT, Eyes-Brown, Conjunctiva-Pink, Sclera-Non-icteric Neck: No JVD, No bruit, Trachea midline. Lungs:  Clear, Bilateral. Cardiac:  Irregular rhythm, normal S1 and S2, no S3. II/VI systolic murmur. Abdomen:  Soft, non-tender. BS present. Extremities:  1 + edema present. No cyanosis. No clubbing. CNS: AxOx3, Cranial nerves grossly intact, moves all 4 extremities.  Skin: Warm and dry.   Intake/Output from previous day: 10/25 0701 - 10/26 0700 In: 91.6 [I.V.:91.6] Out: 700 [Urine:700]    Lab Results: BMET    Component Value Date/Time   NA 134 (L) 09/19/2022 0022   NA 137 09/18/2022 0115   NA 139 09/17/2022 0207   NA 141 06/14/2022 1505   NA 141 04/26/2020 1708   NA 141 06/22/2018 1448   K 4.6 09/19/2022 0022   K 4.0 09/18/2022 0115   K 4.1 09/17/2022 0207   CL 94 (L) 09/19/2022 0022   CL 99 09/18/2022 0115   CL 100 09/17/2022 0207   CO2 31 09/19/2022 0022   CO2 30 09/18/2022 0115   CO2 27 09/17/2022 0207   GLUCOSE 129 (H) 09/19/2022 0022   GLUCOSE 127 (H) 09/18/2022 0115   GLUCOSE 122 (H) 09/17/2022 0207   BUN 21 09/19/2022 0022   BUN 19 09/18/2022 0115   BUN 18 09/17/2022 0207   BUN 17 06/14/2022 1505   BUN 19 04/26/2020 1708   BUN 14 06/22/2018 1448   CREATININE 0.86 09/19/2022 0022    CREATININE 0.70 09/18/2022 0115   CREATININE 0.84 09/17/2022 0207   CALCIUM 8.3 (L) 09/19/2022 0022   CALCIUM 8.5 (L) 09/18/2022 0115   CALCIUM 8.6 (L) 09/17/2022 0207   GFRNONAA >60 09/19/2022 0022   GFRNONAA >60 09/18/2022 0115   GFRNONAA >60 09/17/2022 0207   GFRAA 69 04/26/2020 1708   GFRAA >60 06/15/2019 0841   GFRAA >60 11/13/2018 1941   CBC    Component Value Date/Time   WBC 6.7 09/17/2022 0207   RBC 4.68 09/17/2022 0207   HGB 14.6 09/17/2022 0207   HGB 15.2 04/26/2020 1708   HCT 44.4 09/17/2022 0207   HCT 46.5 04/26/2020 1708   PLT 96 (L) 09/17/2022 0207   PLT 102 (L) 04/26/2020 1708   MCV 94.9 09/17/2022 0207   MCV 94 04/26/2020 1708   MCH 31.2 09/17/2022 0207   MCHC 32.9 09/17/2022 0207   RDW 14.7 09/17/2022 0207   RDW 12.8 04/26/2020 1708   LYMPHSABS 1.1 09/17/2022 0207   LYMPHSABS 1.6 04/26/2020 1708   MONOABS 0.8 09/17/2022 0207   EOSABS 0.0 09/17/2022 0207   EOSABS 0.0 04/26/2020 1708   BASOSABS 0.1 09/17/2022 0207   BASOSABS 0.0 04/26/2020 1708   HEPATIC Function Panel Recent Labs    09/16/22 1929 09/17/22 0207  PROT 7.1 6.9  ALBUMIN 3.2* 3.1*  AST 48*  51*  ALT 59* 59*  ALKPHOS 110 106   HEMOGLOBIN A1C Lab Results  Component Value Date   MPG 126 (H) 07/18/2014   CARDIAC ENZYMES Lab Results  Component Value Date   TROPONINI <0.30 03/24/2012   BNP No results for input(s): "PROBNP" in the last 8760 hours. TSH Recent Labs    09/16/22 1929  TSH 1.792   CHOLESTEROL Recent Labs    06/14/22 1505 09/17/22 0207  CHOL 180 161    Scheduled Meds:  apixaban  5 mg Oral BID   atorvastatin  20 mg Oral QPM   cycloSPORINE  1 drop Both Eyes BID   diltiazem  240 mg Oral Daily   DULoxetine  20 mg Oral Daily   furosemide  40 mg Intravenous Once   gabapentin  100 mg Oral QHS   levETIRAcetam  500 mg Oral BID   metoprolol tartrate  50 mg Oral BID   polyethylene glycol  17 g Oral Daily   senna  1 tablet Oral Daily   Continuous Infusions: PRN  Meds:.diclofenac Sodium  Assessment/Plan: Atrial fibrillation with RVR Acute diastolic left heart failure HTN HLD Obesity Old strokes Arthritis Parkinson's disease  Plan: Add amiodarone for HR control.   LOS: 2 days   Time spent including chart review, lab review, examination, discussion with patient/Nurse : 30 min   Dixie Dials  MD  09/19/2022, 8:47 AM

## 2022-09-19 NOTE — TOC Progression Note (Signed)
Transition of Care [facility]) - Progression Note    Patient Details  Name: [name] [name] MRN: [id number] Date of Birth: [birth year]  Transition of [facility]) CM/SW [place], [place] Phone Number: 09/19/2022, 2:47 PM  Clinical Narrative:     CSW met pt son bedside and provided SNF offer list with medicare star ratings. CSW explained [facility] is considering pt though this will depend on bed availability and DC date. Son would like [place] as first choice if they are able to accept; considering [place] as second choice.   CSW contacted [facility]; they do not know if they will have beds available early next week though will update CSW if they do. TOC will continue to follow.   Expected Discharge Plan: [place] Barriers to Discharge: Continued Medical Work up  Expected Discharge Plan and Services Expected Discharge Plan: [place] arrangements for the past 2 months: Single Family Home                                       Social Determinants of Health (SDOH) Interventions    Readmission Risk Interventions     No data to display

## 2022-09-19 NOTE — Progress Notes (Signed)
     Daily Progress Note Intern Pager: [phone]  Patient name: [name] [name] Medical record number: [id number] Date of birth: [birth year] Age: 88 y.o. Gender: female  Primary Care Provider: Zola Button, MD Consultants: Cardiology Code Status: Full  Pt Overview and Major Events to Date:  10/23-admitted  Assessment and Plan:  [name] [name] is a 88 y.o. female presenting with leg weakness and shortness of breath, found to be volume overloaded in the setting of acute on chronic CHF exacerbation and new onset A-fib with RVR.  * Atrial fibrillation with RVR ([place]) New onset.  Heart rate continues to be elevated.  - Cardiology following, appreciate care and recommendations - Now on p.o. medications: --Diltiazem 240 mg daily --Lopressor 50 mg twice daily --Amiodarone 200 mg twice daily x7 days > qd - P.o. Eliquis 5 mg twice daily - Cardiac monitoring - Plan for outpatient cardioversion after 1 month of anticoagulation per cardiology   Acute on chronic diastolic heart failure ([place]) Continued oxygen requirement: Acute hypoxemic respiratory failure.  Anticipate improvement with rate control and diuresis.  - Cardiology following, appreciate recs - Strict I's and O's, daily weight - IV Lasix as tolerated -- 40mg  this morning, trial 60mg  IV this evening 10/26  Left hip pain Difficulty ambulating - does have chronic leg pain from CVA and known degenerative changes of lumbar spine.  Left hip x-ray without acute fracture. - PT/OT   Essential tremor Reports Xanax use as needed for tremors, no relief per patient.  Also on primidone; however, this notably has a drug interaction with Eliquis. -DC Xanax -Dc'd primidone due to drug interaction w/ Eliquis -f/u neuro outpatient       FEN/GI: Heart healthy carb modified, MiraLAX/senna PPx: Eliquis Dispo:SNF pending clinical improvement . Barriers include A-fib RVR, hypoxemic respiratory failure, continued diuresis.   Subjective:   No acute concerns this morning, son at bedside.  Patient reports her breathing is okay this morning.  However, her heart rates have still been high.  She endorses continued leg "heaviness".  Denies CP, abdominal pain but states she still has not had a bowel movement.  Objective: Temp:  [97.5 F (36.4 C)-98.2 F (36.8 C)] 97.5 F (36.4 C) (10/26 1129) Pulse Rate:  [76-122] 94 (10/26 1129) Resp:  [20-36] 36 (10/26 1129) BP: (91-119)/(72-85) 93/78 (10/26 1129) SpO2:  [93 %-97 %] 93 % (10/26 1129) Weight:  [71.8 kg] 71.8 kg (10/26 0307) Physical Exam: General: NAD, alert Cardiovascular: Irregularly irregular rhythm, tachycardic Respiratory: CTAB anteriorly, on supplemental oxygen Abdomen: Soft, NT/ND Extremities: 1-2+ bilateral lower extremity pitting edema  Laboratory: Most recent CBC Lab Results  Component Value Date   WBC 6.7 09/17/2022   HGB 14.6 09/17/2022   HCT 44.4 09/17/2022   MCV 94.9 09/17/2022   PLT 96 (L) 09/17/2022   Most recent BMP    Latest Ref Rng & Units 09/19/2022   12:22 AM  BMP  Glucose 70 - 99 mg/dL 129   BUN 8 - 23 mg/dL 21   Creatinine 0.44 - 1.00 mg/dL 0.86   Sodium 135 - 145 mmol/L 134   Potassium 3.5 - 5.1 mmol/L 4.6   Chloride 98 - 111 mmol/L 94   CO2 22 - 32 mmol/L 31   Calcium 8.9 - 10.3 mg/dL 8.3     Magnesium 2.1   August Albino, MD 09/19/2022, 12:06 PM  PGY-1, [place] Intern pager: [phone], text pages welcome Secure chat group [place]

## 2022-09-20 ENCOUNTER — Inpatient Hospital Stay (HOSPITAL_COMMUNITY): Payer: Medicare Other

## 2022-09-20 DIAGNOSIS — I4891 Unspecified atrial fibrillation: Secondary | ICD-10-CM | POA: Diagnosis not present

## 2022-09-20 LAB — BLOOD GAS, ARTERIAL
Acid-Base Excess: 13.1 mmol/L — ABNORMAL HIGH (ref 0.0–2.0)
Bicarbonate: 40.5 mmol/L — ABNORMAL HIGH (ref 20.0–28.0)
O2 Saturation: 100 %
Patient temperature: 37
pCO2 arterial: 61 mmHg — ABNORMAL HIGH (ref 32–48)
pH, Arterial: 7.43 (ref 7.35–7.45)
pO2, Arterial: 204 mmHg — ABNORMAL HIGH (ref 83–108)

## 2022-09-20 LAB — COMPREHENSIVE METABOLIC PANEL
ALT: 61 U/L — ABNORMAL HIGH (ref 0–44)
AST: 57 U/L — ABNORMAL HIGH (ref 15–41)
Albumin: 2.8 g/dL — ABNORMAL LOW (ref 3.5–5.0)
Alkaline Phosphatase: 112 U/L (ref 38–126)
Anion gap: 11 (ref 5–15)
BUN: 27 mg/dL — ABNORMAL HIGH (ref 8–23)
CO2: 31 mmol/L (ref 22–32)
Calcium: 8.5 mg/dL — ABNORMAL LOW (ref 8.9–10.3)
Chloride: 96 mmol/L — ABNORMAL LOW (ref 98–111)
Creatinine, Ser: 0.95 mg/dL (ref 0.44–1.00)
GFR, Estimated: 57 mL/min — ABNORMAL LOW (ref >60)
Glucose, Bld: 126 mg/dL — ABNORMAL HIGH (ref 70–99)
Potassium: 4.3 mmol/L (ref 3.5–5.1)
Sodium: 138 mmol/L (ref 135–145)
Total Bilirubin: 0.7 mg/dL (ref 0.3–1.2)
Total Protein: 6.4 g/dL — ABNORMAL LOW (ref 6.5–8.1)

## 2022-09-20 LAB — BLOOD GAS, VENOUS
Acid-Base Excess: 13.7 mmol/L — ABNORMAL HIGH (ref 0.0–2.0)
Bicarbonate: 41.2 mmol/L — ABNORMAL HIGH (ref 20.0–28.0)
O2 Saturation: 90.2 %
Patient temperature: 36.7
pCO2, Ven: 61 mmHg — ABNORMAL HIGH (ref 44–60)
pH, Ven: 7.43 (ref 7.25–7.43)
pO2, Ven: 57 mmHg — ABNORMAL HIGH (ref 32–45)

## 2022-09-20 LAB — CBC
HCT: 45.5 % (ref 36.0–46.0)
Hemoglobin: 14.8 g/dL (ref 12.0–15.0)
MCH: 31 pg (ref 26.0–34.0)
MCHC: 32.5 g/dL (ref 30.0–36.0)
MCV: 95.2 fL (ref 80.0–100.0)
Platelets: 121 10*3/uL — ABNORMAL LOW (ref 150–400)
RBC: 4.78 MIL/uL (ref 3.87–5.11)
RDW: 14.2 % (ref 11.5–15.5)
WBC: 7.5 10*3/uL (ref 4.0–10.5)
nRBC: 0 % (ref 0.0–0.2)

## 2022-09-20 LAB — GLUCOSE, CAPILLARY: Glucose-Capillary: 126 mg/dL — ABNORMAL HIGH (ref 70–99)

## 2022-09-20 LAB — MRSA NEXT GEN BY PCR, NASAL: MRSA by PCR Next Gen: NOT DETECTED

## 2022-09-20 LAB — MAGNESIUM: Magnesium: 2 mg/dL (ref 1.7–2.4)

## 2022-09-20 MED ORDER — LEVETIRACETAM IN NACL 500 MG/100ML IV SOLN
500.0000 mg | Freq: Two times a day (BID) | INTRAVENOUS | Status: DC
  Administered 2022-09-21 – 2022-09-23 (×5): 500 mg via INTRAVENOUS
  Filled 2022-09-20 (×5): qty 100

## 2022-09-20 MED ORDER — AMIODARONE HCL IN DEXTROSE 360-4.14 MG/200ML-% IV SOLN
60.0000 mg/h | INTRAVENOUS | Status: DC
  Administered 2022-09-20 (×2): 60 mg/h via INTRAVENOUS
  Filled 2022-09-20 (×2): qty 200

## 2022-09-20 MED ORDER — CHLORHEXIDINE GLUCONATE CLOTH 2 % EX PADS
6.0000 | MEDICATED_PAD | Freq: Every day | CUTANEOUS | Status: DC
  Administered 2022-09-20 – 2022-10-04 (×10): 6 via TOPICAL

## 2022-09-20 MED ORDER — FUROSEMIDE 10 MG/ML IJ SOLN
80.0000 mg | Freq: Once | INTRAMUSCULAR | Status: AC
  Administered 2022-09-20: 80 mg via INTRAVENOUS
  Filled 2022-09-20: qty 8

## 2022-09-20 MED ORDER — AMIODARONE LOAD VIA INFUSION
150.0000 mg | Freq: Once | INTRAVENOUS | Status: AC
  Administered 2022-09-20: 150 mg via INTRAVENOUS
  Filled 2022-09-20: qty 83.34

## 2022-09-20 MED ORDER — LEVETIRACETAM IN NACL 500 MG/100ML IV SOLN
500.0000 mg | Freq: Two times a day (BID) | INTRAVENOUS | Status: DC
  Administered 2022-09-20: 500 mg via INTRAVENOUS
  Filled 2022-09-20 (×2): qty 100

## 2022-09-20 MED ORDER — ORAL CARE MOUTH RINSE: 15.0000 mL | OROMUCOSAL | Status: DC | PRN

## 2022-09-20 MED ORDER — AMIODARONE HCL IN DEXTROSE 360-4.14 MG/200ML-% IV SOLN
30.0000 mg/h | INTRAVENOUS | Status: DC
  Administered 2022-09-20 – 2022-09-25 (×10): 30 mg/h via INTRAVENOUS
  Filled 2022-09-20 (×9): qty 200

## 2022-09-20 MED ORDER — ORAL CARE MOUTH RINSE
15.0000 mL | OROMUCOSAL | Status: DC
  Administered 2022-09-20 – 2022-10-04 (×44): 15 mL via OROMUCOSAL

## 2022-09-20 MED ORDER — BISACODYL 10 MG RE SUPP
10.0000 mg | Freq: Once | RECTAL | Status: AC
  Administered 2022-09-20: 10 mg via RECTAL
  Filled 2022-09-20: qty 1

## 2022-09-20 NOTE — Plan of Care (Signed)
  Problem: Clinical Measurements: Goal: Respiratory complications will improve Outcome: Progressing   Problem: Coping: Goal: Level of anxiety will decrease Outcome: Progressing   Problem: Elimination: Goal: Will not experience complications related to urinary retention Outcome: Progressing   Problem: Clinical Measurements: Goal: Cardiovascular complication will be avoided Outcome: Not Progressing   Problem: Activity: Goal: Risk for activity intolerance will decrease Outcome: Not Progressing   Problem: Nutrition: Goal: Adequate nutrition will be maintained Outcome: Not Progressing   Problem: Elimination: Goal: Will not experience complications related to bowel motility Outcome: Not Progressing

## 2022-09-20 NOTE — Consult Note (Signed)
NAME:  [name] [name], MRN:  [id number], DOB:  07/20/1930, LOS: 3 ADMISSION DATE:  09/16/2022, CONSULTATION DATE: 09/20/2022 REFERRING MD: Cardiology, CHIEF COMPLAINT: Acute respiratory di compensation  History of Present Illness:  88 year old female with known atrial fibrillation rapid ventricular response who is scheduled for cardioversion but subsequently has developed acute respiratory distress requiring noninvasive imaging treated with aggressive diuresis.  She will need to be transferred to intensive care unit for closer evaluation.  She may need to be intubated for the cardioversion.  Have discussed this with cardiology who agrees to give the Lasix chance to work she may not require intubation.  Pertinent  Medical History   Past Medical History:  Diagnosis Date   Anxiety    Diabetes (HCC) 10/27/2013   DVT (deep venous thrombosis) (HCC)    Essential tremor 10/27/2013   High cholesterol 1999   Hypertension    Ischemic stroke (HCC) 07/18/2014   Obesity, unspecified 10/27/2013   Pneumonia    Unspecified hereditary and idiopathic peripheral neuropathy 10/27/2013     Significant Hospital Events: Including procedures, antibiotic start and stop dates in addition to other pertinent events     Interim History / Subjective:  Increasing respiratory distress  Objective   Blood pressure (!) 118/99, pulse (!) 130, temperature 98 F (36.7 C), temperature source Axillary, resp. rate 20, height 5' (1.524 m), weight 71 kg, SpO2 96 %.    FiO2 (%):  [100 %] 100 %   Intake/Output Summary (Last 24 hours) at 09/20/2022 0901 Last data filed at 09/19/2022 2238 Gross per 24 hour  Intake 120 ml  Output 650 ml  Net -530 ml   Filed Weights   09/18/22 0500 09/19/22 0307 09/20/22 0337  Weight: 67.2 kg 71.8 kg 71 kg    Examination: General: Elderly female who is on noninvasive mechanical ventilatory support HENT: No JVD is appreciated Lungs: Decreased air movement Cardiovascular: Heart  sounds are regular in rate Abdomen: Soft nontender Extremities: 1+ edema Neuro: Does not speak English but appears to be neurologically intact  Resolved Hospital Problem list     Assessment & Plan:  Acute respiratory disc decompensation in the setting of congestive heart failure volume overload complicated by atrial fibrillation rapid ventricular response and 88 year old Positive pressure ventilation with BiPAP Aggressive diuresis Transfer to intensive care unit N.p.o. Limited code with due to intubation for short-term May need intubation to perform shock for atrial fibrillation  Atrial fibrillation Per cardiology  History of Parkinson's disease Continue to monitor in intensive care unit  Best Practice (right click and "Reselect all SmartList Selections" daily)   Diet/type: NPO DVT prophylaxis: DOAC GI prophylaxis: PPI Lines: N/A Foley:  N/A Code Status:  full code Last date of multidisciplinary goals of care discussion [tbd]  Labs   CBC: Recent Labs  Lab 09/16/22 1929 09/17/22 0207  WBC 7.7 6.7  NEUTROABS 5.7 4.7  HGB 15.2* 14.6  HCT 46.1* 44.4  MCV 95.4 94.9  PLT 100* 96*    Basic Metabolic Panel: Recent Labs  Lab 09/16/22 1929 09/17/22 0207 09/18/22 0115 09/19/22 0022 09/20/22 0035  NA 139 139 137 134* 138  K 4.4 4.1 4.0 4.6 4.3  CL 98 100 99 94* 96*  CO2 27 27 30 31 31   GLUCOSE 114* 122* 127* 129* 126*  BUN 20 18 19 21  27*  CREATININE 0.97 0.84 0.70 0.86 0.95  CALCIUM 8.8* 8.6* 8.5* 8.3* 8.5*  MG 2.0  --  2.0 2.1 2.0   GFR: Estimated Creatinine Clearance:  35.3 mL/min (by C-G formula based on SCr of 0.95 mg/dL). Recent Labs  Lab 09/16/22 1929 09/17/22 0207  WBC 7.7 6.7    Liver Function Tests: Recent Labs  Lab 09/16/22 1929 09/17/22 0207 09/20/22 0035  AST 48* 51* 57*  ALT 59* 59* 61*  ALKPHOS 110 106 112  BILITOT 0.6 0.6 0.7  PROT 7.1 6.9 6.4*  ALBUMIN 3.2* 3.1* 2.8*   No results for input(s): "LIPASE", "AMYLASE" in the last  168 hours. No results for input(s): "AMMONIA" in the last 168 hours.  ABG    Component Value Date/Time   PHART 7.43 09/20/2022 0833   PCO2ART 61 (H) 09/20/2022 0833   PO2ART 204 (H) 09/20/2022 0833   HCO3 40.5 (H) 09/20/2022 0833   TCO2 32 12/08/2010 2038   O2SAT 100 09/20/2022 0833     Coagulation Profile: No results for input(s): "INR", "PROTIME" in the last 168 hours.  Cardiac Enzymes: No results for input(s): "CKTOTAL", "CKMB", "CKMBINDEX", "TROPONINI" in the last 168 hours.  HbA1C: Hemoglobin A1C  Date/Time Value Ref Range Status  11/30/2020 12:09 PM 5.4 4.0 - 5.6 % Final   HbA1c, POC (controlled diabetic range)  Date/Time Value Ref Range Status  06/14/2022 12:15 PM 5.6 0.0 - 7.0 % Final  12/17/2018 03:56 PM 5.3 0.0 - 7.0 % Final    CBG: No results for input(s): "GLUCAP" in the last 168 hours.  Review of Systems:   Na   Past Medical History:  She,  has a past medical history of Anxiety, Diabetes (HCC) (10/27/2013), DVT (deep venous thrombosis) (HCC), Essential tremor (10/27/2013), High cholesterol (1999), Hypertension, Ischemic stroke (HCC) (07/18/2014), Obesity, unspecified (10/27/2013), Pneumonia, and Unspecified hereditary and idiopathic peripheral neuropathy (10/27/2013).   Surgical History:   Past Surgical History:  Procedure Laterality Date   CATARACT EXTRACTION Bilateral    FOOT SURGERY     IR KYPHO LUMBAR INC FX REDUCE BONE BX UNI/BIL CANNULATION INC/IMAGING  06/15/2019     Social History:   reports that she has never smoked. She has never used smokeless tobacco. She reports that she does not drink alcohol and does not use drugs.   Family History:  Her family history includes Coronary artery disease in an other family member; Heart Problems in her brother and sister; Stroke in her mother. There is no history of Tremor.   Allergies Allergies  Allergen Reactions   Tape Rash    Please do not use Plastic Tape     Home Medications  Prior to Admission  medications   Medication Sig Start Date End Date Taking? Authorizing Provider  acetaminophen (TYLENOL) 325 MG tablet Take 2 tablets (650 mg total) by mouth every 6 (six) hours as needed. Patient taking differently: Take 325 mg by mouth every 6 (six) hours as needed for moderate pain. 06/01/17  Yes Derwood Kaplan, MD  ALPRAZolam (XANAX) 0.25 MG tablet Take 0.25 mg by mouth 2 (two) times daily as needed for anxiety.    Yes [provider]  amLODipine (NORVASC) 2.5 MG tablet Take 2.5 mg by mouth daily. 05/29/17  Yes [provider]  atorvastatin (LIPITOR) 20 MG tablet TAKE 1 TABLET BY MOUTH EVERY DAY IN THE EVENING Patient taking differently: Take 20 mg by mouth every evening. 05/10/22  Yes [name], MD  CALCIUM PO Take 1 tablet by mouth daily.   Yes [provider]  Cholecalciferol (VITAMIN D PO) Take 1 tablet by mouth daily.   Yes [provider]  clopidogrel (PLAVIX) 75 MG tablet  TAKE 1 TABLET BY MOUTH EVERY DAY Patient taking differently: Take 75 mg by mouth once. 06/17/22  Yes [name], MD  diclofenac Sodium (VOLTAREN) 1 % GEL Apply 2 g topically 4 (four) times daily as needed. 07/26/22  Yes [name], MD  DULoxetine (CYMBALTA) 20 MG capsule TAKE 1 CAPSULE BY MOUTH EVERY DAY Patient taking differently: Take 20 mg by mouth daily. 01/07/22  Yes Kerrin Champagne, MD  furosemide (LASIX) 20 MG tablet Take 20 mg by mouth daily. 06/08/18  Yes [provider]  gabapentin (NEURONTIN) 100 MG capsule Take 1 capsule (100 mg total) by mouth at bedtime. Patient taking differently: Take 100 mg by mouth in the morning and at bedtime. 03/05/22  Yes Huston Foley, MD  isosorbide mononitrate (IMDUR) 30 MG 24 hr tablet Take 15 mg by mouth daily. 08/09/22  Yes [provider]  levETIRAcetam (KEPPRA) 500 MG tablet Take 1 tablet (500 mg total) by mouth 2 (two) times daily. 03/05/22  Yes Huston Foley, MD  lidocaine (LIDODERM) 5 % Place 1 patch onto the skin daily.  Remove & Discard patch within 12 hours or as directed by MD 05/10/22  Yes [name], MD  PAZEO 0.7 % SOLN Place 1 drop into both eyes at bedtime.  07/24/18  Yes [provider]  primidone (MYSOLINE) 250 MG tablet Take 1 tablet (250 mg total) by mouth 2 (two) times daily. 03/05/22  Yes Huston Foley, MD  RESTASIS 0.05 % ophthalmic emulsion Place 1 drop into both eyes 2 (two) times daily.  09/29/14  Yes [provider]  ACCU-CHEK AVIVA PLUS test strip USE AS INSTRUCTED TO TEST ONCE A DAY DX CODE:E11.9 11/22/19   Sandre Kitty, MD  Accu-Chek Softclix Lancets lancets Use as instructed 09/23/20   Sandre Kitty, MD     Critical care time: 45 min     Brett Canales Karson Reede ACNP Acute Care Nurse Practitioner [facility]/Critical [facility] Please consult Amion 09/20/2022, 9:01 AM

## 2022-09-20 NOTE — Consult Note (Signed)
Ref: [name], MD   Subjective:  Awake. Respiratory distress continues. RR-20-30/min. Now on BiPAP. Atrial fibrillation continues. HR in 90-120's.  Chest x-ray suggestive of CHF and atelectasis. Sodium level has normalized and creatinine is normal but edging up with diuresis.  Objective:  Vital Signs in the last 24 hours: Temp:  [97.5 F (36.4 C)-98.4 F (36.9 C)] 98 F (36.7 C) (10/27 0745) Pulse Rate:  [94-161] 100 (10/27 0820) Cardiac Rhythm: Atrial fibrillation (10/26 1945) Resp:  [19-36] 20 (10/27 0820) BP: (91-112)/(62-96) 101/67 (10/27 0745) SpO2:  [88 %-98 %] 96 % (10/27 0820) FiO2 (%):  [100 %] 100 % (10/27 0820) Weight:  [71 kg] 71 kg (10/27 0337)  Physical Exam: BP Readings from Last 1 Encounters:  09/20/22 101/67     Wt Readings from Last 1 Encounters:  09/20/22 71 kg    Weight change: -0.8 kg Body mass index is 30.57 kg/m. HEENT: [place]/AT, Eyes-Brown, Conjunctiva-Pink, Sclera-Non-icteric Neck: No JVD, No bruit, Trachea midline. Lungs:  Clearing, Bilateral. Cardiac:  Regular rhythm, normal S1 and S2, no S3. II/VI systolic murmur. Abdomen:  Soft, non-tender. BS present. Extremities:  Trace edema present. No cyanosis. No clubbing. CNS: AxOx3, Cranial nerves grossly intact, moves all 4 extremities.  Skin: Warm and dry.   Intake/Output from previous day: 10/26 0701 - 10/27 0700 In: 240 [P.O.:240] Out: 650 [Urine:650]    Lab Results: BMET    Component Value Date/Time   NA 138 09/20/2022 0035   NA 134 (L) 09/19/2022 0022   NA 137 09/18/2022 0115   NA 141 06/14/2022 1505   NA 141 04/26/2020 1708   NA 141 06/22/2018 1448   K 4.3 09/20/2022 0035   K 4.6 09/19/2022 0022   K 4.0 09/18/2022 0115   CL 96 (L) 09/20/2022 0035   CL 94 (L) 09/19/2022 0022   CL 99 09/18/2022 0115   CO2 31 09/20/2022 0035   CO2 31 09/19/2022 0022   CO2 30 09/18/2022 0115   GLUCOSE 126 (H) 09/20/2022 0035   GLUCOSE 129 (H) 09/19/2022 0022   GLUCOSE 127 (H) 09/18/2022 0115    BUN 27 (H) 09/20/2022 0035   BUN 21 09/19/2022 0022   BUN 19 09/18/2022 0115   BUN 17 06/14/2022 1505   BUN 19 04/26/2020 1708   BUN 14 06/22/2018 1448   CREATININE 0.95 09/20/2022 0035   CREATININE 0.86 09/19/2022 0022   CREATININE 0.70 09/18/2022 0115   CALCIUM 8.5 (L) 09/20/2022 0035   CALCIUM 8.3 (L) 09/19/2022 0022   CALCIUM 8.5 (L) 09/18/2022 0115   GFRNONAA 57 (L) 09/20/2022 0035   GFRNONAA >60 09/19/2022 0022   GFRNONAA >60 09/18/2022 0115   GFRAA 69 04/26/2020 1708   GFRAA >60 06/15/2019 0841   GFRAA >60 11/13/2018 1941   CBC    Component Value Date/Time   WBC 6.7 09/17/2022 0207   RBC 4.68 09/17/2022 0207   HGB 14.6 09/17/2022 0207   HGB 15.2 04/26/2020 1708   HCT 44.4 09/17/2022 0207   HCT 46.5 04/26/2020 1708   PLT 96 (L) 09/17/2022 0207   PLT 102 (L) 04/26/2020 1708   MCV 94.9 09/17/2022 0207   MCV 94 04/26/2020 1708   MCH 31.2 09/17/2022 0207   MCHC 32.9 09/17/2022 0207   RDW 14.7 09/17/2022 0207   RDW 12.8 04/26/2020 1708   LYMPHSABS 1.1 09/17/2022 0207   LYMPHSABS 1.6 04/26/2020 1708   MONOABS 0.8 09/17/2022 0207   EOSABS 0.0 09/17/2022 0207   EOSABS 0.0 04/26/2020 1708  BASOSABS 0.1 09/17/2022 0207   BASOSABS 0.0 04/26/2020 1708   HEPATIC Function Panel Recent Labs    09/16/22 1929 09/17/22 0207 09/20/22 0035  PROT 7.1 6.9 6.4*  ALBUMIN 3.2* 3.1* 2.8*  AST 48* 51* 57*  ALT 59* 59* 61*  ALKPHOS 110 106 112   HEMOGLOBIN A1C Lab Results  Component Value Date   MPG 126 (H) 07/18/2014   CARDIAC ENZYMES Lab Results  Component Value Date   TROPONINI <0.30 03/24/2012   BNP No results for input(s): "PROBNP" in the last 8760 hours. TSH Recent Labs    09/16/22 1929  TSH 1.792   CHOLESTEROL Recent Labs    06/14/22 1505 09/17/22 0207  CHOL 180 161    Scheduled Meds:  amiodarone  150 mg Intravenous Once   apixaban  5 mg Oral BID   atorvastatin  20 mg Oral QPM   cycloSPORINE  1 drop Both Eyes BID   diltiazem  240 mg Oral  Daily   DULoxetine  20 mg Oral Daily   furosemide  80 mg Intravenous Once   gabapentin  100 mg Oral QHS   levETIRAcetam  500 mg Oral BID   metoprolol tartrate  50 mg Oral BID   polyethylene glycol  17 g Oral BID   senna  1 tablet Oral Daily   Continuous Infusions:  amiodarone     Followed by   amiodarone     PRN Meds:.diclofenac Sodium  Assessment/Plan: Atrial fibrillation with moderate ventricular response Acute diastolic left heart failure HTN HLD Obesity Old strokes Arthritis Parkinson's disease  Plan: Change PO amiodarone to IV amiodarone ABGs CCM consulted/Possible intubation    LOS: 3 days   Time spent including chart review, lab review, examination, discussion with patient/Family : 30 min   Dixie Dials  MD  09/20/2022, 8:32 AM

## 2022-09-20 NOTE — Progress Notes (Signed)
OT Cancellation Note  Patient Details Name: [name] [name] MRN: [id number] DOB: 27-Dec-1929   Cancelled Treatment:    Reason Eval/Treat Not Completed: Medical issues which prohibited therapy (Pt with HR in 140s with afib/RVR.)  Malka So 09/20/2022 Cleta Alberts, OTR/L Acute Rehabilitation Services Office: [phone]

## 2022-09-20 NOTE — Progress Notes (Signed)
PT Cancellation Note  Patient Details Name: [name] [name] MRN: [id number] DOB: 24-Sep-1930   Cancelled Treatment:    Reason Eval/Treat Not Completed: Medical issues which prohibited therapy. Patient with tachycardia and respiratory distress; Cardiology MD requesting hold on PT treatment. Will follow-up.  Mabeline Caras, PT, DPT Acute Rehabilitation Services  Personal: [facility] Office: [place] 09/20/2022, 8:46 AM

## 2022-09-20 NOTE — Progress Notes (Signed)
Paged MD Doylene Canard at 0730 due to Afib RVR with rates in the 140-150's and increasing oxygen needs. Patient moved from 6 L nasal cannula to 15 L Non rebreather. Orders to get stat chest x-ray. Upon assessment by physician orders to start IV Amio, give IV lasix, and transition to Bipap. Patient resting comfortably at this time.

## 2022-09-20 NOTE — Significant Event (Addendum)
Rapid Response Event Note   Reason for Call :  Acute respiratory distress with oxygen desaturation  Initial Focused Assessment:  Pt lying in bed, lethargic. She is oriented and answering questions appropriately. Lung sounds are clear. She is diminished in the right lower lobe. Skin is warm, dry, pink. No edema or JVD noted.   VS: T 22F, BP 101/67, HR 127, RR 28, SpO2 96% on 100% NRB  CBG: 126  Interventions:  -CXR -ABG  7.43/ 61/ 204/ 40.5 -BiPAP 100% 16/6  Plan of Care:  -BiPAP and 80mg  IV Lasix now -Start Amio gtt with loading dose -Strict I&Os  Call rapid response for additional needs  Event Summary:  MD Notified: Dr. Doylene Canard Call Time: 1594 Arrival Time: 5859 End Time: [place], RN

## 2022-09-20 NOTE — Progress Notes (Signed)
FMTS Interim Progress Note  S: In to see patient this morning.  Dr. Doylene Canard and PCCM NP Richardson Landry Minor arrived at bedside as well. Patient currently on BiPAP with HR fluctuating between 110s-130s on the monitor.  Discussed with patient's granddaughter at bedside, patient would prefer to be DNR/DNI but would be okay with temporary intubation if needed for cardioversion.   Spoke with Dr. Doylene Canard and NP Minor, PCCM to assume care of patient, transferring to ICU.  Hopeful to address respiratory distress with aggressive diuresis with potential for cardioversion soon.  O: BP (!) 118/99   Pulse (!) 104   Temp 98 F (36.7 C) (Axillary)   Resp (!) 27   Ht 5' (1.524 m)   Wt 71 kg   SpO2 97%   BMI 30.57 kg/m     A/P: Transferred to ICU  [name] Albino, MD 09/20/2022, 9:52 AM PGY-1, [place] [facility] Service pager [phone]

## 2022-09-20 NOTE — Progress Notes (Signed)
eLink Physician-Brief Progress Note Patient Name: [name] [name] DOB: 16-Nov-1930 MRN: [id number]   Date of Service  09/20/2022  HPI/Events of Note  Family requesting update on medical plan regarding diuresis for flash pulmonary edema in setting of AFRVR  Given lasix 80 this am. UOP 300 cc during day shift Grand daughter is a Garment/textile technologist - reports possible 300 cc undocumented  eICU Interventions  Repeat lasix 80 mg now BMET already ordered for am     Intervention Category Intermediate Interventions: Other:  Danon Lograsso Rodman Pickle 09/20/2022, 8:25 PM

## 2022-09-20 NOTE — Progress Notes (Signed)
Mobility Specialist Progress Note    09/20/22 1010  Mobility  Activity Contraindicated/medical hold   RN advised. Will f/u when appropriate.   Hildred Alamin Mobility Specialist  Secure Chat Only

## 2022-09-20 NOTE — Progress Notes (Signed)
FMTS Interim Progress Note  S:Paged for continued RVR-rates in 160s. Cardiology also has been paged. Went to see patient at bedside and she is on non-rebreather at 15 L. Spoke with granddaughter and she says her grandma was initially saturating in the 23s when she first got there. Family is wanting cardioversion for patient. She asked about possibility of amiodarone drip as she has not been on that since being here. I said I would reach out to cardiology for further recommendations. Spoke with nursing again and cardiology has seen patient. For now continuing BiPAP and getting CXR. Possible cardioversion in a couple days? Hope to discuss further with cardiology. CXR came back with persistent pulmonary edema. Didn't have much output on 40 and 60 IV lasix yesterday. Will redose lasix at 80 today. Monitor BP closely after administration.  O: BP 101/67 (BP Location: Right Arm)   Pulse 100   Temp 98 F (36.7 C) (Axillary)   Resp 20   Ht 5' (1.524 m)   Wt 71 kg   SpO2 96%   BMI 30.57 kg/m    Gerrit Heck, MD 09/20/2022, 8:26 AM PGY-2, [place] [facility] pager [phone]

## 2022-09-21 DIAGNOSIS — I10 Essential (primary) hypertension: Secondary | ICD-10-CM | POA: Diagnosis not present

## 2022-09-21 DIAGNOSIS — I4891 Unspecified atrial fibrillation: Secondary | ICD-10-CM | POA: Diagnosis not present

## 2022-09-21 DIAGNOSIS — I5033 Acute on chronic diastolic (congestive) heart failure: Secondary | ICD-10-CM | POA: Diagnosis not present

## 2022-09-21 LAB — COMPREHENSIVE METABOLIC PANEL
ALT: 51 U/L — ABNORMAL HIGH (ref 0–44)
AST: 41 U/L (ref 15–41)
Albumin: 2.6 g/dL — ABNORMAL LOW (ref 3.5–5.0)
Alkaline Phosphatase: 105 U/L (ref 38–126)
Anion gap: 12 (ref 5–15)
BUN: 39 mg/dL — ABNORMAL HIGH (ref 8–23)
CO2: 33 mmol/L — ABNORMAL HIGH (ref 22–32)
Calcium: 8.4 mg/dL — ABNORMAL LOW (ref 8.9–10.3)
Chloride: 94 mmol/L — ABNORMAL LOW (ref 98–111)
Creatinine, Ser: 0.9 mg/dL (ref 0.44–1.00)
GFR, Estimated: >60 mL/min (ref >60)
Glucose, Bld: 135 mg/dL — ABNORMAL HIGH (ref 70–99)
Potassium: 3.9 mmol/L (ref 3.5–5.1)
Sodium: 139 mmol/L (ref 135–145)
Total Bilirubin: 0.7 mg/dL (ref 0.3–1.2)
Total Protein: 6.2 g/dL — ABNORMAL LOW (ref 6.5–8.1)

## 2022-09-21 LAB — CBC
HCT: 42.2 % (ref 36.0–46.0)
Hemoglobin: 14 g/dL (ref 12.0–15.0)
MCH: 31 pg (ref 26.0–34.0)
MCHC: 33.2 g/dL (ref 30.0–36.0)
MCV: 93.4 fL (ref 80.0–100.0)
Platelets: 132 10*3/uL — ABNORMAL LOW (ref 150–400)
RBC: 4.52 MIL/uL (ref 3.87–5.11)
RDW: 14.1 % (ref 11.5–15.5)
WBC: 6.8 10*3/uL (ref 4.0–10.5)
nRBC: 0 % (ref 0.0–0.2)

## 2022-09-21 LAB — MAGNESIUM: Magnesium: 2 mg/dL (ref 1.7–2.4)

## 2022-09-21 MED ORDER — DILTIAZEM HCL ER 90 MG PO CP12
90.0000 mg | ORAL_CAPSULE | Freq: Three times a day (TID) | ORAL | Status: DC
  Administered 2022-09-21 – 2022-09-23 (×6): 90 mg via ORAL
  Filled 2022-09-21 (×8): qty 1

## 2022-09-21 MED ORDER — FUROSEMIDE 10 MG/ML IJ SOLN
60.0000 mg | Freq: Once | INTRAMUSCULAR | Status: AC
  Administered 2022-09-21: 60 mg via INTRAVENOUS
  Filled 2022-09-21: qty 6

## 2022-09-21 MED ORDER — METOPROLOL TARTRATE 25 MG PO TABS
50.0000 mg | ORAL_TABLET | Freq: Three times a day (TID) | ORAL | Status: DC
  Administered 2022-09-21 – 2022-09-23 (×8): 50 mg via ORAL
  Filled 2022-09-21 (×8): qty 2

## 2022-09-21 MED ORDER — DIGOXIN 0.25 MG/ML IJ SOLN
0.2500 mg | Freq: Once | INTRAMUSCULAR | Status: AC
  Administered 2022-09-21: 0.25 mg via INTRAVENOUS
  Filled 2022-09-21: qty 1

## 2022-09-21 MED ORDER — PRIMIDONE 250 MG PO TABS
250.0000 mg | ORAL_TABLET | Freq: Two times a day (BID) | ORAL | Status: DC
  Filled 2022-09-21: qty 1

## 2022-09-21 MED ORDER — FUROSEMIDE 10 MG/ML IJ SOLN
120.0000 mg | Freq: Once | INTRAVENOUS | Status: DC
  Filled 2022-09-21: qty 12

## 2022-09-21 MED ORDER — POTASSIUM CHLORIDE ER 10 MEQ PO TBCR
10.0000 meq | EXTENDED_RELEASE_TABLET | Freq: Every day | ORAL | Status: DC
  Administered 2022-09-21 – 2022-09-23 (×3): 10 meq via ORAL
  Filled 2022-09-21 (×5): qty 1

## 2022-09-21 MED ORDER — ALBUMIN HUMAN 25 % IV SOLN
12.5000 g | Freq: Once | INTRAVENOUS | Status: AC
  Administered 2022-09-21: 12.5 g via INTRAVENOUS
  Filled 2022-09-21: qty 50

## 2022-09-21 NOTE — Progress Notes (Signed)
RT note- Patient placed on high flow nasal canula, 25L, fio2 60%. Placed on this device due to sp02 88%, and attempt to keep Bipap off for comfort, family at the bedside.

## 2022-09-21 NOTE — Progress Notes (Addendum)
NAME:  [name] [name], MRN:  [id number], DOB:  12/27/1929, LOS: 4 ADMISSION DATE:  09/16/2022, CONSULTATION DATE:  10/27 REFERRING MD:  Cardiology, CHIEF COMPLAINT:  Afib, Acute respiratory failure   History of Present Illness:  88 year old female with known atrial fibrillation rapid ventricular response who is scheduled for cardioversion but subsequently has developed acute respiratory distress requiring noninvasive imaging treated with aggressive diuresis.  She will need to be transferred to intensive care unit for closer evaluation.  She may need to be intubated for the cardioversion.  Have discussed this with cardiology who agrees to give the Lasix chance to work she may not require intubation.  Pertinent  Medical History   Past Medical History:  Diagnosis Date   Anxiety    Diabetes ([place]) 10/27/2013   DVT (deep venous thrombosis) (HCC)    Essential tremor 10/27/2013   High cholesterol 1999   Hypertension    Ischemic stroke ([place]) 07/18/2014   Obesity, unspecified 10/27/2013   Pneumonia    Unspecified hereditary and idiopathic peripheral neuropathy 10/27/2013   Significant Hospital Events: Including procedures, antibiotic start and stop dates in addition to other pertinent events   10/27 Admitted to ICU  Interim History / Subjective:  No acute events overnight Per granddaughter, patient tolerated BiPAP overnight  Objective   Blood pressure 112/65, pulse (!) 123, temperature 98.1 F (36.7 C), temperature source Axillary, resp. rate (!) 21, height 5' (1.524 m), weight 71.8 kg, SpO2 95 %.    FiO2 (%):  [40 %-100 %] 40 %   Intake/Output Summary (Last 24 hours) at 09/21/2022 0742 Last data filed at 09/21/2022 0700 Gross per 24 hour  Intake 651.02 ml  Output 650 ml  Net 1.02 ml   Filed Weights   09/19/22 0307 09/20/22 0337 09/21/22 0500  Weight: 71.8 kg 71 kg 71.8 kg    Examination: General: Acutely ill elderly woman laying in bed.  NAD. HENT: [place]/AT.  Moist mucous  membrane. Lungs: On BiPAP.  Decreased air movement anterior lung sounds.  Distant crackles at the bases. Cardiovascular: Tachycardic.  Irregularly irregular rhythm. No m/r/g Abdomen: Soft.  NT/ND.  Normal BS. Extremities: Warm and well perfused.  Trace BLE pitting edema Neuro: Sleepy but arousable. Spontaneously opens eyes.  Borderline extremities.  WBC and Hgb stable, platelet 132 K+ 3.9, bicarb 33, creatinine 0.9 VBG last night PCO2 61  Resolved Hospital Problem list     Assessment & Plan:  Acute hypoxic respiratory failure Acute on chronic diastolic heart failure Pulmonary edema In the setting of A-fib with RVR.  Tolerated BiPAP overnight.  Mental status improving. UOP of 650 in the last 24 hours s/p IV Lasix 80 mg x2. Per granddaughter likely about 300 cc UOP undocumented. Continue aggressive diuresing -Can wean off BIPAP, O2 supplementation for O2 sat >92% -IV lasix 60 mg x1 after IV albumin -Strict I&O's, daily weight -Trend and replete electrolytes -Continue to monitor resp and mental status   Atrial fibrillation Remains in A-fib overnight with HR in the 110s to 140s.  No electrolyte abnormalities.  Pending cardioversion by cardiology. -Cardiology following, appreciate recs -Continue IV amiodarone -Continue oral diltiazem -Continue Eliquis 5 mg twice daily -Trend and replete electrolytes  HTN BP has been relatively stable with SBP 100s to 120s. -Continue metoprolol  HLD -Continue Lipitor 20 mg daily   Essential tremor Anxiety Per granddaughter, patient does not have a diagnosis of Parkinson's.  She does have essential tremor that has been stable. -Continue IV Keppra -Continue gabapentin and duloxetine -  CTM  Best Practice (right click and "Reselect all SmartList Selections" daily)   Diet/type: NPO DVT prophylaxis: DOAC GI prophylaxis: PPI Lines: N/A Foley:  N/A Code Status:  full code Last date of multidisciplinary goals of care discussion  [[place]]  Labs   CBC: Recent Labs  Lab 09/16/22 1929 09/17/22 0207 09/20/22 0922 09/21/22 0253  WBC 7.7 6.7 7.5 6.8  NEUTROABS 5.7 4.7  --   --   HGB 15.2* 14.6 14.8 14.0  HCT 46.1* 44.4 45.5 42.2  MCV 95.4 94.9 95.2 93.4  PLT 100* 96* 121* 132*    Basic Metabolic Panel: Recent Labs  Lab 09/16/22 1929 09/17/22 0207 09/18/22 0115 09/19/22 0022 09/20/22 0035 09/21/22 0253  NA 139 139 137 134* 138 139  K 4.4 4.1 4.0 4.6 4.3 3.9  CL 98 100 99 94* 96* 94*  CO2 27 27 30 31 31  33*  GLUCOSE 114* 122* 127* 129* 126* 135*  BUN 20 18 19 21  27* 39*  CREATININE 0.97 0.84 0.70 0.86 0.95 0.90  CALCIUM 8.8* 8.6* 8.5* 8.3* 8.5* 8.4*  MG 2.0  --  2.0 2.1 2.0 2.0   GFR: Estimated Creatinine Clearance: 37.5 mL/min (by C-G formula based on SCr of 0.9 mg/dL). Recent Labs  Lab 09/16/22 1929 09/17/22 0207 09/20/22 0922 09/21/22 0253  WBC 7.7 6.7 7.5 6.8    Liver Function Tests: Recent Labs  Lab 09/16/22 1929 09/17/22 0207 09/20/22 0035 09/21/22 0253  AST 48* 51* 57* 41  ALT 59* 59* 61* 51*  ALKPHOS 110 106 112 105  BILITOT 0.6 0.6 0.7 0.7  PROT 7.1 6.9 6.4* 6.2*  ALBUMIN 3.2* 3.1* 2.8* 2.6*   No results for input(s): "LIPASE", "AMYLASE" in the last 168 hours. No results for input(s): "AMMONIA" in the last 168 hours.  ABG    Component Value Date/Time   PHART 7.43 09/20/2022 0833   PCO2ART 61 (H) 09/20/2022 0833   PO2ART 204 (H) 09/20/2022 0833   HCO3 41.2 (H) 09/20/2022 1554   TCO2 32 12/08/2010 2038   O2SAT 90.2 09/20/2022 1554     Coagulation Profile: No results for input(s): "INR", "PROTIME" in the last 168 hours.  Cardiac Enzymes: No results for input(s): "CKTOTAL", "CKMB", "CKMBINDEX", "TROPONINI" in the last 168 hours.  HbA1C: Hemoglobin A1C  Date/Time Value Ref Range Status  11/30/2020 12:09 PM 5.4 4.0 - 5.6 % Final   HbA1c, POC (controlled diabetic range)  Date/Time Value Ref Range Status  06/14/2022 12:15 PM 5.6 0.0 - 7.0 % Final  12/17/2018  03:56 PM 5.3 0.0 - 7.0 % Final    CBG: Recent Labs  Lab 09/20/22 1145  GLUCAP 126*    Review of Systems:   As in HPI  Past Medical History:  She,  has a past medical history of Anxiety, Diabetes ([place]) (10/27/2013), DVT (deep venous thrombosis) ([place]), Essential tremor (10/27/2013), High cholesterol (1999), Hypertension, Ischemic stroke ([place]) (07/18/2014), Obesity, unspecified (10/27/2013), Pneumonia, and Unspecified hereditary and idiopathic peripheral neuropathy (10/27/2013).   Surgical History:   Past Surgical History:  Procedure Laterality Date   CATARACT EXTRACTION Bilateral    FOOT SURGERY     IR KYPHO LUMBAR INC FX REDUCE BONE BX UNI/BIL CANNULATION INC/IMAGING  06/15/2019     Social History:   reports that she has never smoked. She has never used smokeless tobacco. She reports that she does not drink alcohol and does not use drugs.   Family History:  Her family history includes Coronary artery disease in an other family member;  Heart Problems in her brother and sister; Stroke in her mother. There is no history of Tremor.   Allergies Allergies  Allergen Reactions   Tape Rash    Please do not use Plastic Tape     Home Medications  Prior to Admission medications   Medication Sig Start Date End Date Taking? Authorizing Provider  acetaminophen (TYLENOL) 325 MG tablet Take 2 tablets (650 mg total) by mouth every 6 (six) hours as needed. Patient taking differently: Take 325 mg by mouth every 6 (six) hours as needed for moderate pain. 06/01/17  Yes Varney Biles, MD  ALPRAZolam (XANAX) 0.25 MG tablet Take 0.25 mg by mouth 2 (two) times daily as needed for anxiety.    Yes [provider]  amLODipine (NORVASC) 2.5 MG tablet Take 2.5 mg by mouth daily. 05/29/17  Yes [provider]  atorvastatin (LIPITOR) 20 MG tablet TAKE 1 TABLET BY MOUTH EVERY DAY IN THE EVENING Patient taking differently: Take 20 mg by mouth every evening. 05/10/22  Yes Zola Button, MD   CALCIUM PO Take 1 tablet by mouth daily.   Yes [provider]  Cholecalciferol (VITAMIN D PO) Take 1 tablet by mouth daily.   Yes [provider]  clopidogrel (PLAVIX) 75 MG tablet TAKE 1 TABLET BY MOUTH EVERY DAY Patient taking differently: Take 75 mg by mouth once. 06/17/22  Yes Zola Button, MD  diclofenac Sodium (VOLTAREN) 1 % GEL Apply 2 g topically 4 (four) times daily as needed. 07/26/22  Yes Zola Button, MD  DULoxetine (CYMBALTA) 20 MG capsule TAKE 1 CAPSULE BY MOUTH EVERY DAY Patient taking differently: Take 20 mg by mouth daily. 01/07/22  Yes Jessy Oto, MD  furosemide (LASIX) 20 MG tablet Take 20 mg by mouth daily. 06/08/18  Yes [provider]  gabapentin (NEURONTIN) 100 MG capsule Take 1 capsule (100 mg total) by mouth at bedtime. Patient taking differently: Take 100 mg by mouth in the morning and at bedtime. 03/05/22  Yes Star Age, MD  isosorbide mononitrate (IMDUR) 30 MG 24 hr tablet Take 15 mg by mouth daily. 08/09/22  Yes [provider]  levETIRAcetam (KEPPRA) 500 MG tablet Take 1 tablet (500 mg total) by mouth 2 (two) times daily. 03/05/22  Yes Star Age, MD  lidocaine (LIDODERM) 5 % Place 1 patch onto the skin daily. Remove & Discard patch within 12 hours or as directed by MD 05/10/22  Yes Zola Button, MD  PAZEO 0.7 % SOLN Place 1 drop into both eyes at bedtime.  07/24/18  Yes [provider]  primidone (MYSOLINE) 250 MG tablet Take 1 tablet (250 mg total) by mouth 2 (two) times daily. 03/05/22  Yes Star Age, MD  RESTASIS 0.05 % ophthalmic emulsion Place 1 drop into both eyes 2 (two) times daily.  09/29/14  Yes [provider]  [place] test strip USE AS INSTRUCTED TO TEST ONCE A DAY DX CODE:E11.9 11/22/19   Benay Pike, MD  Accu-Chek Softclix Lancets lancets Use as instructed 09/23/20   Benay Pike, MD     Critical care time: 31

## 2022-09-21 NOTE — Progress Notes (Signed)
RT attempt x 2 unsuccessful. RN aware.

## 2022-09-21 NOTE — Consult Note (Signed)
Ref: [name], MD   Subjective:  Awakens easily and moves all 4 extremities on command. On Bipap. Oxygen saturation 97 %. Monitor shows atrial fibrillation with RVR with IV amiodarone.  Objective:  Vital Signs in the last 24 hours: Temp:  [97.5 F (36.4 C)-98.5 F (36.9 C)] 98.1 F (36.7 C) (10/28 0315) Pulse Rate:  [88-154] 101 (10/28 0830) Cardiac Rhythm: Atrial fibrillation (10/28 0800) Resp:  [15-35] 21 (10/28 0830) BP: (92-150)/(52-136) 107/92 (10/28 0830) SpO2:  [72 %-100 %] 97 % (10/28 0830) FiO2 (%):  [40 %-50 %] 40 % (10/27 1611) Weight:  [71.8 kg] 71.8 kg (10/28 0500)  Physical Exam: BP Readings from Last 1 Encounters:  09/21/22 (!) 107/92     Wt Readings from Last 1 Encounters:  09/21/22 71.8 kg    Weight change: 0.8 kg Body mass index is 30.91 kg/m. HEENT: [place]/AT, Eyes-Brown, Conjunctiva-Pink, Sclera-Non-icteric Neck: No JVD, No bruit, Trachea midline. Lungs:  Clearing, Bilateral. Cardiac:  Irregular rhythm, normal S1 and S2, no S3. II/VI systolic murmur. Abdomen:  Soft, non-tender. BS present. Extremities:  Trace edema present. No cyanosis. No clubbing. CNS: AxOx1, Cranial nerves grossly intact, moves all 4 extremities.  Skin: Warm and dry.   Intake/Output from previous day: 10/27 0701 - 10/28 0700 In: 651 [I.V.:451.4; IV Piggyback:199.6] Out: 650 [Urine:650]    Lab Results: BMET    Component Value Date/Time   NA 139 09/21/2022 0253   NA 138 09/20/2022 0035   NA 134 (L) 09/19/2022 0022   NA 141 06/14/2022 1505   NA 141 04/26/2020 1708   NA 141 06/22/2018 1448   K 3.9 09/21/2022 0253   K 4.3 09/20/2022 0035   K 4.6 09/19/2022 0022   CL 94 (L) 09/21/2022 0253   CL 96 (L) 09/20/2022 0035   CL 94 (L) 09/19/2022 0022   CO2 33 (H) 09/21/2022 0253   CO2 31 09/20/2022 0035   CO2 31 09/19/2022 0022   GLUCOSE 135 (H) 09/21/2022 0253   GLUCOSE 126 (H) 09/20/2022 0035   GLUCOSE 129 (H) 09/19/2022 0022   BUN 39 (H) 09/21/2022 0253   BUN 27 (H)  09/20/2022 0035   BUN 21 09/19/2022 0022   BUN 17 06/14/2022 1505   BUN 19 04/26/2020 1708   BUN 14 06/22/2018 1448   CREATININE 0.90 09/21/2022 0253   CREATININE 0.95 09/20/2022 0035   CREATININE 0.86 09/19/2022 0022   CALCIUM 8.4 (L) 09/21/2022 0253   CALCIUM 8.5 (L) 09/20/2022 0035   CALCIUM 8.3 (L) 09/19/2022 0022   GFRNONAA >60 09/21/2022 0253   GFRNONAA 57 (L) 09/20/2022 0035   GFRNONAA >60 09/19/2022 0022   GFRAA 69 04/26/2020 1708   GFRAA >60 06/15/2019 0841   GFRAA >60 11/13/2018 1941   CBC    Component Value Date/Time   WBC 6.8 09/21/2022 0253   RBC 4.52 09/21/2022 0253   HGB 14.0 09/21/2022 0253   HGB 15.2 04/26/2020 1708   HCT 42.2 09/21/2022 0253   HCT 46.5 04/26/2020 1708   PLT 132 (L) 09/21/2022 0253   PLT 102 (L) 04/26/2020 1708   MCV 93.4 09/21/2022 0253   MCV 94 04/26/2020 1708   MCH 31.0 09/21/2022 0253   MCHC 33.2 09/21/2022 0253   RDW 14.1 09/21/2022 0253   RDW 12.8 04/26/2020 1708   LYMPHSABS 1.1 09/17/2022 0207   LYMPHSABS 1.6 04/26/2020 1708   MONOABS 0.8 09/17/2022 0207   EOSABS 0.0 09/17/2022 0207   EOSABS 0.0 04/26/2020 1708   BASOSABS 0.1 09/17/2022 0207  BASOSABS 0.0 04/26/2020 1708   HEPATIC Function Panel Recent Labs    09/17/22 0207 09/20/22 0035 09/21/22 0253  PROT 6.9 6.4* 6.2*  ALBUMIN 3.1* 2.8* 2.6*  AST 51* 57* 41  ALT 59* 61* 51*  ALKPHOS 106 112 105   HEMOGLOBIN A1C Lab Results  Component Value Date   MPG 126 (H) 07/18/2014   CARDIAC ENZYMES Lab Results  Component Value Date   TROPONINI <0.30 03/24/2012   BNP No results for input(s): "PROBNP" in the last 8760 hours. TSH Recent Labs    09/16/22 1929  TSH 1.792   CHOLESTEROL Recent Labs    06/14/22 1505 09/17/22 0207  CHOL 180 161    Scheduled Meds:  apixaban  5 mg Oral BID   atorvastatin  20 mg Oral QPM   Chlorhexidine Gluconate Cloth  6 each Topical Daily   cycloSPORINE  1 drop Both Eyes BID   diltiazem  240 mg Oral Daily   diltiazem  90 mg  Oral Q8H   DULoxetine  20 mg Oral Daily   gabapentin  100 mg Oral QHS   metoprolol tartrate  50 mg Oral TID   mouth rinse  15 mL Mouth Rinse 4 times per day   polyethylene glycol  17 g Oral BID   senna  1 tablet Oral Daily   Continuous Infusions:  albumin human     amiodarone 30 mg/hr (09/21/22 0851)   levETIRAcetam Stopped (09/21/22 0401)   PRN Meds:.diclofenac Sodium, mouth rinse  Assessment/Plan: Atrial fibrillation with Moderate to rapid ventricular response Acute diastolic left heart failure HTN HLD Obesity Old strokes Arthritis Parkinson's disease  Plan: Continue IV amiodarone. Increase Metoprolol and diltiazem dose. IV albumin x 1 to facilitate additional diuresis. TEE with external cardioversion when stable from respiratory stand point.   LOS: 4 days   Time spent including chart review, lab review, examination, discussion with patient/Family/Resident/Nurse : 30 min   Dixie Dials  MD  09/21/2022, 9:44 AM

## 2022-09-22 ENCOUNTER — Inpatient Hospital Stay (HOSPITAL_COMMUNITY): Payer: Medicare Other

## 2022-09-22 DIAGNOSIS — Z7189 Other specified counseling: Secondary | ICD-10-CM | POA: Diagnosis not present

## 2022-09-22 DIAGNOSIS — I4891 Unspecified atrial fibrillation: Secondary | ICD-10-CM | POA: Diagnosis not present

## 2022-09-22 DIAGNOSIS — R609 Edema, unspecified: Secondary | ICD-10-CM

## 2022-09-22 DIAGNOSIS — I5033 Acute on chronic diastolic (congestive) heart failure: Secondary | ICD-10-CM | POA: Diagnosis not present

## 2022-09-22 DIAGNOSIS — Z515 Encounter for palliative care: Secondary | ICD-10-CM

## 2022-09-22 DIAGNOSIS — Z66 Do not resuscitate: Secondary | ICD-10-CM

## 2022-09-22 DIAGNOSIS — K922 Gastrointestinal hemorrhage, unspecified: Secondary | ICD-10-CM | POA: Diagnosis not present

## 2022-09-22 DIAGNOSIS — Z7901 Long term (current) use of anticoagulants: Secondary | ICD-10-CM

## 2022-09-22 LAB — COMPREHENSIVE METABOLIC PANEL
ALT: 62 U/L — ABNORMAL HIGH (ref 0–44)
AST: 67 U/L — ABNORMAL HIGH (ref 15–41)
Albumin: 2.4 g/dL — ABNORMAL LOW (ref 3.5–5.0)
Alkaline Phosphatase: 94 U/L (ref 38–126)
Anion gap: 7 (ref 5–15)
BUN: 54 mg/dL — ABNORMAL HIGH (ref 8–23)
CO2: 35 mmol/L — ABNORMAL HIGH (ref 22–32)
Calcium: 8 mg/dL — ABNORMAL LOW (ref 8.9–10.3)
Chloride: 94 mmol/L — ABNORMAL LOW (ref 98–111)
Creatinine, Ser: 0.8 mg/dL (ref 0.44–1.00)
GFR, Estimated: >60 mL/min (ref >60)
Glucose, Bld: 118 mg/dL — ABNORMAL HIGH (ref 70–99)
Potassium: 4.1 mmol/L (ref 3.5–5.1)
Sodium: 136 mmol/L (ref 135–145)
Total Bilirubin: 0.4 mg/dL (ref 0.3–1.2)
Total Protein: 5.6 g/dL — ABNORMAL LOW (ref 6.5–8.1)

## 2022-09-22 LAB — CBC
HCT: 25.8 % — ABNORMAL LOW (ref 36.0–46.0)
HCT: 24.2 % — ABNORMAL LOW (ref 36.0–46.0)
HCT: 21.2 % — ABNORMAL LOW (ref 36.0–46.0)
Hemoglobin: 8.1 g/dL — ABNORMAL LOW (ref 12.0–15.0)
Hemoglobin: 7.9 g/dL — ABNORMAL LOW (ref 12.0–15.0)
Hemoglobin: 7 g/dL — ABNORMAL LOW (ref 12.0–15.0)
MCH: 31 pg (ref 26.0–34.0)
MCH: 31 pg (ref 26.0–34.0)
MCH: 31.1 pg (ref 26.0–34.0)
MCHC: 31.4 g/dL (ref 30.0–36.0)
MCHC: 32.6 g/dL (ref 30.0–36.0)
MCHC: 33 g/dL (ref 30.0–36.0)
MCV: 98.9 fL (ref 80.0–100.0)
MCV: 94.9 fL (ref 80.0–100.0)
MCV: 94.2 fL (ref 80.0–100.0)
Platelets: 100 10*3/uL — ABNORMAL LOW (ref 150–400)
Platelets: 104 10*3/uL — ABNORMAL LOW (ref 150–400)
Platelets: 99 10*3/uL — ABNORMAL LOW (ref 150–400)
RBC: 2.61 MIL/uL — ABNORMAL LOW (ref 3.87–5.11)
RBC: 2.55 MIL/uL — ABNORMAL LOW (ref 3.87–5.11)
RBC: 2.25 MIL/uL — ABNORMAL LOW (ref 3.87–5.11)
RDW: 14.1 % (ref 11.5–15.5)
RDW: 13.8 % (ref 11.5–15.5)
RDW: 14.1 % (ref 11.5–15.5)
WBC: 5.3 10*3/uL (ref 4.0–10.5)
WBC: 6.4 10*3/uL (ref 4.0–10.5)
WBC: 4.9 10*3/uL (ref 4.0–10.5)
nRBC: 0 % (ref 0.0–0.2)
nRBC: 0 % (ref 0.0–0.2)
nRBC: 0 % (ref 0.0–0.2)

## 2022-09-22 LAB — URINALYSIS, COMPLETE (UACMP) WITH MICROSCOPIC
Bilirubin Urine: NEGATIVE
Glucose, UA: NEGATIVE mg/dL
Ketones, ur: NEGATIVE mg/dL
Nitrite: POSITIVE — AB
Protein, ur: 30 mg/dL — AB
RBC / HPF: >50 RBC/hpf (ref 0–5)
Specific Gravity, Urine: 1.02 (ref 1.005–1.030)
WBC, UA: >50 WBC/hpf (ref 0–5)
pH: 6.5 (ref 5.0–8.0)

## 2022-09-22 LAB — MAGNESIUM: Magnesium: 1.9 mg/dL (ref 1.7–2.4)

## 2022-09-22 LAB — ABO/RH: ABO/RH(D): O POS

## 2022-09-22 MED ORDER — LACTATED RINGERS IV BOLUS
500.0000 mL | Freq: Once | INTRAVENOUS | Status: AC
  Administered 2022-09-22: 500 mL via INTRAVENOUS

## 2022-09-22 MED ORDER — PANTOPRAZOLE SODIUM 40 MG IV SOLR
40.0000 mg | Freq: Two times a day (BID) | INTRAVENOUS | Status: DC
  Administered 2022-09-22 – 2022-09-24 (×5): 40 mg via INTRAVENOUS
  Filled 2022-09-22 (×4): qty 10

## 2022-09-22 MED ORDER — SODIUM CHLORIDE 0.9 % IV SOLN
2.0000 g | INTRAVENOUS | Status: DC
  Administered 2022-09-22: 2 g via INTRAVENOUS
  Filled 2022-09-22: qty 20

## 2022-09-22 MED ORDER — FUROSEMIDE 10 MG/ML IJ SOLN
20.0000 mg | Freq: Once | INTRAMUSCULAR | Status: AC
  Administered 2022-09-23: 20 mg via INTRAVENOUS
  Filled 2022-09-22: qty 2

## 2022-09-22 MED ORDER — MAGNESIUM SULFATE 2 GM/50ML IV SOLN
2.0000 g | Freq: Once | INTRAVENOUS | Status: AC
  Administered 2022-09-22: 2 g via INTRAVENOUS
  Filled 2022-09-22: qty 50

## 2022-09-22 MED ORDER — DIGOXIN 0.25 MG/ML IJ SOLN
0.2500 mg | Freq: Four times a day (QID) | INTRAMUSCULAR | Status: DC
  Administered 2022-09-22: 0.25 mg via INTRAVENOUS
  Filled 2022-09-22 (×2): qty 1

## 2022-09-22 MED ORDER — ALBUMIN HUMAN 25 % IV SOLN
25.0000 g | Freq: Four times a day (QID) | INTRAVENOUS | Status: AC
  Administered 2022-09-22 (×3): 25 g via INTRAVENOUS
  Filled 2022-09-22 (×3): qty 100

## 2022-09-22 MED ORDER — SODIUM CHLORIDE 0.9% IV SOLUTION: Freq: Once | INTRAVENOUS | Status: AC

## 2022-09-22 NOTE — Progress Notes (Addendum)
eLink Physician-Brief Progress Note Patient Name: COBY ANTROBUS DOB: 1930-02-03 MRN: [id number]   Date of Service  09/22/2022  HPI/Events of Note  Hg at 7. On going bleeding per RN discussion. DNR. On amiodarone gtt for a fib RVR.  Lasix earlier for fluid overload.   88 year old female with history of CVA previously on Plavix, hypertension, diabetes admitted with weakness found to have A-fib and started on Eliquis now with development of acute on chronic CHF and GI bleeding.   Family refusing endoscopy or CTA. GI on board. Notes reviewed.  Did not get any transfusion so far.  Family has given consent for PRBC.   eICU Interventions  1 PRBC. To give lasix 20 post transfusion.  On heated HF o2, 30 L/40%.      Intervention Category Intermediate Interventions: Bleeding - evaluation and treatment with blood products  Elmer Sow 09/22/2022, 11:46 PM

## 2022-09-22 NOTE — Progress Notes (Signed)
VASCULAR LAB    Bilateral lower extremity venous duplex has been performed.  See CV proc for preliminary results.   Imir Brumbach, RVT 09/22/2022, 11:21 AM

## 2022-09-22 NOTE — Progress Notes (Signed)
Pt not tolarating BIPAP. Plaxed pt back on heated highflow. Pt resting.

## 2022-09-22 NOTE — Consult Note (Addendum)
Consultation  Referring Provider: No ref. provider found Primary Care Physician:  [name], MD Primary Gastroenterologist: ?   Reason for Consultation: GI bleeding/maroon stool  HPI: [name] [name] is a 88 y.o. female, with history of adult onset diabetes mellitus, hypertension, anxiety, and previous ischemic CVA for which she has been on Plavix. She was admitted about 5 days ago with weakness and found to be in A-fib with RVR.  She has been started on Eliquis. Since admit she has also developed acute on chronic congestive heart failure/diastolic Had an episode of lethargy and respiratory distress with desaturation on 09/20/2022 and was transferred to the ICU, and has required BiPAP. She has remained in A-fib with heart rate in the 110s to 140s, plan had been for cardioversion. Last evening she had 1 episode of dark maroonish stool, and had 1 more episode this morning which was somewhat larger amount She does not speak [place] but family is at bedside, patient is denying any abdominal pain, no cramping, no heartburn or indigestion no nausea Relates that she really has not had any GI issues though she has had prior colonoscopies here in [place] that they believe were negative.  Lab review-10/27/ 2023 hemoglobin 14.8/hematocrit 45.5 10/28-hemoglobin 14/hematocrit 42  Follow-up hemoglobin today pending BUN 54/creatinine 0.8/potassium 4.1  Family needed during our conversation that they have decided not to pursue any further aggressive management, and want to keep her comfortable.  They are to meet with palliative care later today.  They do not want to discontinue her current care and would like supportive management but do not want any very aggressive or invasive management.    Past Medical History:  Diagnosis Date   Anxiety    Diabetes (HCC) 10/27/2013   DVT (deep venous thrombosis) (HCC)    Essential tremor 10/27/2013   High cholesterol 1999   Hypertension    Ischemic  stroke (HCC) 07/18/2014   Obesity, unspecified 10/27/2013   Pneumonia    Unspecified hereditary and idiopathic peripheral neuropathy 10/27/2013    Past Surgical History:  Procedure Laterality Date   CATARACT EXTRACTION Bilateral    FOOT SURGERY     IR KYPHO LUMBAR INC FX REDUCE BONE BX UNI/BIL CANNULATION INC/IMAGING  06/15/2019    Prior to Admission medications   Medication Sig Start Date End Date Taking? Authorizing Provider  acetaminophen (TYLENOL) 325 MG tablet Take 2 tablets (650 mg total) by mouth every 6 (six) hours as needed. Patient taking differently: Take 325 mg by mouth every 6 (six) hours as needed for moderate pain. 06/01/17  Yes Derwood Kaplan, MD  ALPRAZolam (XANAX) 0.25 MG tablet Take 0.25 mg by mouth 2 (two) times daily as needed for anxiety.    Yes [provider]  amLODipine (NORVASC) 2.5 MG tablet Take 2.5 mg by mouth daily. 05/29/17  Yes [provider]  atorvastatin (LIPITOR) 20 MG tablet TAKE 1 TABLET BY MOUTH EVERY DAY IN THE EVENING Patient taking differently: Take 20 mg by mouth every evening. 05/10/22  Yes [name], MD  CALCIUM PO Take 1 tablet by mouth daily.   Yes [provider]  Cholecalciferol (VITAMIN D PO) Take 1 tablet by mouth daily.   Yes [provider]  clopidogrel (PLAVIX) 75 MG tablet TAKE 1 TABLET BY MOUTH EVERY DAY Patient taking differently: Take 75 mg by mouth once. 06/17/22  Yes [name], MD  diclofenac Sodium (VOLTAREN) 1 % GEL Apply 2 g topically 4 (four) times daily as needed. 07/26/22  Yes  Zola Button, MD  DULoxetine (CYMBALTA) 20 MG capsule TAKE 1 CAPSULE BY MOUTH EVERY DAY Patient taking differently: Take 20 mg by mouth daily. 01/07/22  Yes Jessy Oto, MD  furosemide (LASIX) 20 MG tablet Take 20 mg by mouth daily. 06/08/18  Yes [provider]  gabapentin (NEURONTIN) 100 MG capsule Take 1 capsule (100 mg total) by mouth at bedtime. Patient taking differently: Take 100 mg by mouth in the  morning and at bedtime. 03/05/22  Yes Star Age, MD  isosorbide mononitrate (IMDUR) 30 MG 24 hr tablet Take 15 mg by mouth daily. 08/09/22  Yes [provider]  levETIRAcetam (KEPPRA) 500 MG tablet Take 1 tablet (500 mg total) by mouth 2 (two) times daily. 03/05/22  Yes Star Age, MD  lidocaine (LIDODERM) 5 % Place 1 patch onto the skin daily. Remove & Discard patch within 12 hours or as directed by MD 05/10/22  Yes Zola Button, MD  PAZEO 0.7 % SOLN Place 1 drop into both eyes at bedtime.  07/24/18  Yes [provider]  primidone (MYSOLINE) 250 MG tablet Take 1 tablet (250 mg total) by mouth 2 (two) times daily. 03/05/22  Yes Star Age, MD  RESTASIS 0.05 % ophthalmic emulsion Place 1 drop into both eyes 2 (two) times daily.  09/29/14  Yes [provider]  [place] test strip USE AS INSTRUCTED TO TEST ONCE A DAY DX CODE:E11.9 11/22/19   Benay Pike, MD  Accu-Chek Softclix Lancets lancets Use as instructed 09/23/20   Benay Pike, MD    Current Facility-Administered Medications  Medication Dose Route Frequency Provider Last Rate Last Admin   albumin human 25 % solution 25 g  25 g Intravenous Q6H Freddi Starr, MD 54 mL/hr at 09/22/22 1000 Infusion Verify at 09/22/22 1000   amiodarone (NEXTERONE PREMIX) 360-4.14 MG/200ML-% (1.8 mg/mL) IV infusion  30 mg/hr Intravenous Continuous Minor, Grace Bushy, NP 16.67 mL/hr at 09/22/22 1000 30 mg/hr at 09/22/22 1000   atorvastatin (LIPITOR) tablet 20 mg  20 mg Oral QPM Minor, Grace Bushy, NP   20 mg at 09/21/22 1736   Chlorhexidine Gluconate Cloth 2 % PADS 6 each  6 each Topical Daily Jacky Kindle, MD   6 each at 09/21/22 0830   cycloSPORINE (RESTASIS) 0.05 % ophthalmic emulsion 1 drop  1 drop Both Eyes BID Minor, Grace Bushy, NP   1 drop at 09/22/22 1136   diclofenac Sodium (VOLTAREN) 1 % topical gel 2 g  2 g Topical QID PRN Minor, Grace Bushy, NP       diltiazem (CARDIZEM SR) 12 hr capsule 90 mg  90 mg Oral Q8H  Dixie Dials, MD   90 mg at 09/22/22 0510   DULoxetine (CYMBALTA) DR capsule 20 mg  20 mg Oral Daily Minor, Grace Bushy, NP   20 mg at 09/22/22 1137   gabapentin (NEURONTIN) capsule 100 mg  100 mg Oral QHS Minor, Grace Bushy, NP   100 mg at 09/21/22 2126   levETIRAcetam (KEPPRA) IVPB 500 mg/100 mL premix  500 mg Intravenous Q12H Maryjane Hurter, MD   Stopped at 09/22/22 0329   metoprolol tartrate (LOPRESSOR) tablet 50 mg  50 mg Oral TID Dixie Dials, MD   50 mg at 09/22/22 0956   Oral care mouth rinse  15 mL Mouth Rinse 4 times per day Maryjane Hurter, MD   15 mL at 09/22/22 1138   Oral care mouth rinse  15 mL Mouth Rinse PRN Verlee Monte,  Hortencia Conradi, MD       pantoprazole (PROTONIX) injection 40 mg  40 mg Intravenous Q12H Freddi Starr, MD   40 mg at 09/22/22 1042   polyethylene glycol (MIRALAX / GLYCOLAX) packet 17 g  17 g Oral BID Minor, Grace Bushy, NP   17 g at 09/21/22 2131   potassium chloride (KLOR-CON) CR tablet 10 mEq  10 mEq Oral Daily Dixie Dials, MD   10 mEq at 09/22/22 0951   senna (SENOKOT) tablet 8.6 mg  1 tablet Oral Daily Minor, Grace Bushy, NP   8.6 mg at 09/21/22 1025    Allergies as of 09/16/2022 - Review Complete 09/16/2022  Allergen Reaction Noted   Tape Rash 07/22/2014    Family History  Problem Relation Age of Onset   Stroke Mother    Heart Problems Sister    Heart Problems Brother    Coronary artery disease Other    Tremor Neg Hx     Social History   Socioeconomic History   Marital status: Widowed    Spouse name: Not on file   Number of children: 5   Years of education: 9   Highest education level: Not on file  Occupational History    Comment: does not work  Tobacco Use   Smoking status: Never   Smokeless tobacco: Never  Vaping Use   Vaping Use: Never used  Substance and Sexual Activity   Alcohol use: No    Alcohol/week: 0.0 standard drinks of alcohol   Drug use: No   Sexual activity: Never  Other Topics Concern   Not on file  Social History  Narrative   Patient is right handed and resides with son   Social Determinants of Health   Financial Resource Strain: Not on file  Food Insecurity: Not on file  Transportation Needs: Not on file  Physical Activity: Not on file  Stress: Not on file  Social Connections: Not on file  Intimate Partner Violence: Not on file    Review of Systems: Pertinent positive and negative review of systems were noted in the above HPI section.  All other review of systems was otherwise negative.   Physical Exam: Vital signs in last 24 hours: Temp:  [97.5 F (36.4 C)-98 F (36.7 C)] 98 F (36.7 C) (10/29 0720) Pulse Rate:  [78-133] 112 (10/29 1030) Resp:  [17-41] 41 (10/29 1030) BP: (87-179)/(43-154) 111/53 (10/29 1030) SpO2:  [89 %-99 %] 98 % (10/29 1030) FiO2 (%):  [40 %-60 %] 40 % (10/29 0756) Weight:  [72.2 kg] 72.2 kg (10/29 0500) Last BM Date : 09/21/22 General:   Alert,  Well-developed, very elderly female  cooperative in mild distress Head:  Normocephalic and atraumatic. Eyes:  Sclera clear, no icterus.   Conjunctiva pink. Ears:  Normal auditory acuity. Nose:  No deformity, discharge,  or lesions. Mouth:  No deformity or lesions.   Neck:  Supple; no masses or thyromegaly. Lungs: Somewhat decreased breath sounds,  heart: Tachy irregular rate and rhythm; no murmurs, clicks, rubs,  or gallops. Abdomen:  Soft, obese, nontender, BS active,nonpalp mass or hsm.   Rectal: Not done Msk:  Symmetrical without gross deformities. . Pulses:  Normal pulses noted. Extremities:  Without clubbing or edema. Neurologic:  Alert and able to answer short questions Skin:  Intact without significant lesions or rashes.. Psych:  Alert and cooperative. Normal mood and affect.  Intake/Output from previous day: 10/28 0701 - 10/29 0700 In: 594.8 [I.V.:394.8; IV Piggyback:200] Out: 1600 [Urine:1600] Intake/Output this  shift: Total I/O In: 109.3 [I.V.:50; IV Piggyback:59.3] Out: -   Lab Results: Recent  Labs    09/20/22 0922 09/21/22 0253  WBC 7.5 6.8  HGB 14.8 14.0  HCT 45.5 42.2  PLT 121* 132*   BMET Recent Labs    09/20/22 0035 09/21/22 0253 09/22/22 0207  NA 138 139 136  K 4.3 3.9 4.1  CL 96* 94* 94*  CO2 31 33* 35*  GLUCOSE 126* 135* 118*  BUN 27* 39* 54*  CREATININE 0.95 0.90 0.80  CALCIUM 8.5* 8.4* 8.0*   LFT Recent Labs    09/22/22 0207  PROT 5.6*  ALBUMIN 2.4*  AST 67*  ALT 62*  ALKPHOS 94  BILITOT 0.4   PT/INR No results for input(s): "LABPROT", "INR" in the last 72 hours. Hepatitis Panel No results for input(s): "HEPBSAG", "HCVAB", "HEPAIGM", "HEPBIGM" in the last 72 hours.   IMPRESSION:  #[age] year old female, non-English-speaking (speaks [place]) admitted 5 days ago with weakness and found to have A-fib with RVR, and acute on chronic diastolic heart failure She has been persistently symptomatic, had an episode of lethargy and respiratory distress on 10/27 requiring BiPAP support  Was to be for cardioversion On Eliquis since admission  #2 prior CVA-been on Plavix at home #3 acute GI bleed in setting of anticoagulation-onset maroonish stool last night, 2 episodes so far, painless She also has an elevated BUN raising suspicion for upper GI source, though unclear at this time, certainly may have a small bowel or colonic source Hemoglobin yesterday very good at 14  Plan; Family has decided that they do not want to pursue more aggressive management and are leaning more towards supportive comfort measures without withdrawal of her current care  Would allow clear liquids as tolerated Hold Eliquis She will hemoglobins every 6 hours and transfuse as indicated.  Family stated they would be okay with transfusions IV PPI every 12 hours We discussed possible CT angio to assess for active bleed/determine site, but general goal of CT angio if positive is to pursue IR arteriogram with embolization.  Family at this time does not want to pursue CT  angio.     Cherryl Babin EsterwoodPA-C 09/22/2022, 12:01 PM

## 2022-09-22 NOTE — Progress Notes (Signed)
NAME:  [name] [name], MRN:  [id number], DOB:  [birth year], LOS: 5 ADMISSION DATE:  09/16/2022, CONSULTATION DATE:  10/27 REFERRING MD:  Cardiology, CHIEF COMPLAINT:  Afib, Acute respiratory failure   History of Present Illness:  89 year old female with known atrial fibrillation rapid ventricular response who is scheduled for cardioversion but subsequently has developed acute respiratory distress requiring noninvasive imaging treated with aggressive diuresis.  She was  transferred to intensive care unit for closer evaluation and bipap treatment.   Pertinent  Medical History   Past Medical History:  Diagnosis Date   Anxiety    Diabetes ([place]) 10/27/2013   DVT (deep venous thrombosis) (HCC)    Essential tremor 10/27/2013   High cholesterol 1999   Hypertension    Ischemic stroke ([place]) 07/18/2014   Obesity, unspecified 10/27/2013   Pneumonia    Unspecified hereditary and idiopathic peripheral neuropathy 10/27/2013   Significant Hospital Events: Including procedures, antibiotic start and stop dates in addition to other pertinent events   10/27 Admitted to ICU, placed on Bipap overnight  Interim History / Subjective:  No acute events overnight Did not receive bipap overnight  Net negative 1L in past 24 hours, -2.9L since admission  Patient's daughter and granddaughter at bedside. Concern for increased right lower extremity weakness, more so than from her previous stroke. They have requested palliative care consult for support.  Around 10am patient had large melanotic stool.   Objective   Blood pressure 107/76, pulse (!) 109, temperature 98 F (36.7 C), temperature source Oral, resp. rate 19, height 5' (1.524 m), weight 72.2 kg, SpO2 98 %.    FiO2 (%):  [40 %-60 %] 40 %   Intake/Output Summary (Last 24 hours) at 09/22/2022 0734 Last data filed at 09/22/2022 0700 Gross per 24 hour  Intake 594.81 ml  Output 1600 ml  Net -1005.19 ml   Filed Weights   09/20/22 0337 09/21/22 0500  09/22/22 0500  Weight: 71 kg 71.8 kg 72.2 kg   Examination: General: elderly woman laying in bed. Mild distress. HENT: [place]/AT.  Moist mucous membrane. Lungs: diminished breath sounds. No wheezing. Cardiovascular: Tachycardic.  Irregularly irregular rhythm. No m/r/g Abdomen: Soft.  NT/ND.  Normal BS. Extremities: Warm and well perfused.  No edema Neuro: follows all commands  Resolved Hospital Problem list     Assessment & Plan:  Acute hypoxic respiratory failure Acute on chronic diastolic heart failure Pulmonary edema Possible Obstructive Sleep Apnea -Wean O2 supplementation for O2 sat >92% -Hold further diuresis at this time -Strict I&O's, daily weight -Continue to monitor resp and mental status   Acute Upper Gastrointestinal Bleeding - check H/H q6 hours - transfuse for hemoglobin < 7g/dL - pantoprazole IV BID - GI consulted, will hold off on EGD per family request - Albumin 25g q6hrs for 3 doses  Atrial fibrillation -Cardiology following, appreciate recs -Continue IV amiodarone -Continue oral diltiazem -Continue metoprolol - Complete digoxin load today -Hold Eliquis due to GI bleed -Trend and replete electrolytes  HTN BP has been relatively stable with SBP 100s to 120s. -Continue metoprolol  HLD -Continue Lipitor 20 mg daily   Essential tremor Anxiety Per granddaughter, patient does not have a diagnosis of Parkinson's.  She does have essential tremor that has been stable. -Continue IV Keppra -Continue gabapentin and duloxetine -CTM  Best Practice (right click and "Reselect all SmartList Selections" daily)   Diet/type: NPO DVT prophylaxis: not indicated, GI bleed GI prophylaxis: PPI Lines: N/A Foley:  N/A Code Status:  full code Last  date of multidisciplinary goals of care discussion [[place]]  Labs   CBC: Recent Labs  Lab 09/16/22 1929 09/17/22 0207 09/20/22 0922 09/21/22 0253  WBC 7.7 6.7 7.5 6.8  NEUTROABS 5.7 4.7  --   --   HGB 15.2* 14.6  14.8 14.0  HCT 46.1* 44.4 45.5 42.2  MCV 95.4 94.9 95.2 93.4  PLT 100* 96* 121* 132*    Basic Metabolic Panel: Recent Labs  Lab 09/18/22 0115 09/19/22 0022 09/20/22 0035 09/21/22 0253 09/22/22 0207  NA 137 134* 138 139 136  K 4.0 4.6 4.3 3.9 4.1  CL 99 94* 96* 94* 94*  CO2 30 31 31  33* 35*  GLUCOSE 127* 129* 126* 135* 118*  BUN 19 21 27* 39* 54*  CREATININE 0.70 0.86 0.95 0.90 0.80  CALCIUM 8.5* 8.3* 8.5* 8.4* 8.0*  MG 2.0 2.1 2.0 2.0 1.9   GFR: Estimated Creatinine Clearance: 42.3 mL/min (by C-G formula based on SCr of 0.8 mg/dL). Recent Labs  Lab 09/16/22 1929 09/17/22 0207 09/20/22 0922 09/21/22 0253  WBC 7.7 6.7 7.5 6.8    Liver Function Tests: Recent Labs  Lab 09/16/22 1929 09/17/22 0207 09/20/22 0035 09/21/22 0253 09/22/22 0207  AST 48* 51* 57* 41 67*  ALT 59* 59* 61* 51* 62*  ALKPHOS 110 106 112 105 94  BILITOT 0.6 0.6 0.7 0.7 0.4  PROT 7.1 6.9 6.4* 6.2* 5.6*  ALBUMIN 3.2* 3.1* 2.8* 2.6* 2.4*   No results for input(s): "LIPASE", "AMYLASE" in the last 168 hours. No results for input(s): "AMMONIA" in the last 168 hours.  ABG    Component Value Date/Time   PHART 7.43 09/20/2022 0833   PCO2ART 61 (H) 09/20/2022 0833   PO2ART 204 (H) 09/20/2022 0833   HCO3 41.2 (H) 09/20/2022 1554   TCO2 32 12/08/2010 2038   O2SAT 90.2 09/20/2022 1554     Coagulation Profile: No results for input(s): "INR", "PROTIME" in the last 168 hours.  Cardiac Enzymes: No results for input(s): "CKTOTAL", "CKMB", "CKMBINDEX", "TROPONINI" in the last 168 hours.  HbA1C: Hemoglobin A1C  Date/Time Value Ref Range Status  11/30/2020 12:09 PM 5.4 4.0 - 5.6 % Final   HbA1c, POC (controlled diabetic range)  Date/Time Value Ref Range Status  06/14/2022 12:15 PM 5.6 0.0 - 7.0 % Final  12/17/2018 03:56 PM 5.3 0.0 - 7.0 % Final    CBG: Recent Labs  Lab 09/20/22 1145  GLUCAP 126*    Review of Systems:   As in HPI  Past Medical History:  She,  has a past medical  history of Anxiety, Diabetes ([place]) (10/27/2013), DVT (deep venous thrombosis) ([place]), Essential tremor (10/27/2013), High cholesterol (1999), Hypertension, Ischemic stroke ([place]) (07/18/2014), Obesity, unspecified (10/27/2013), Pneumonia, and Unspecified hereditary and idiopathic peripheral neuropathy (10/27/2013).   Surgical History:   Past Surgical History:  Procedure Laterality Date   CATARACT EXTRACTION Bilateral    FOOT SURGERY     IR KYPHO LUMBAR INC FX REDUCE BONE BX UNI/BIL CANNULATION INC/IMAGING  06/15/2019     Social History:   reports that she has never smoked. She has never used smokeless tobacco. She reports that she does not drink alcohol and does not use drugs.   Family History:  Her family history includes Coronary artery disease in an other family member; Heart Problems in her brother and sister; Stroke in her mother. There is no history of Tremor.   Allergies Allergies  Allergen Reactions   Tape Rash    Please do not use Plastic Tape  Home Medications  Prior to Admission medications   Medication Sig Start Date End Date Taking? Authorizing Provider  acetaminophen (TYLENOL) 325 MG tablet Take 2 tablets (650 mg total) by mouth every 6 (six) hours as needed. Patient taking differently: Take 325 mg by mouth every 6 (six) hours as needed for moderate pain. 06/01/17  Yes Varney Biles, MD  ALPRAZolam (XANAX) 0.25 MG tablet Take 0.25 mg by mouth 2 (two) times daily as needed for anxiety.    Yes [provider]  amLODipine (NORVASC) 2.5 MG tablet Take 2.5 mg by mouth daily. 05/29/17  Yes [provider]  atorvastatin (LIPITOR) 20 MG tablet TAKE 1 TABLET BY MOUTH EVERY DAY IN THE EVENING Patient taking differently: Take 20 mg by mouth every evening. 05/10/22  Yes Zola Button, MD  CALCIUM PO Take 1 tablet by mouth daily.   Yes [provider]  Cholecalciferol (VITAMIN D PO) Take 1 tablet by mouth daily.   Yes [provider]  clopidogrel  (PLAVIX) 75 MG tablet TAKE 1 TABLET BY MOUTH EVERY DAY Patient taking differently: Take 75 mg by mouth once. 06/17/22  Yes Zola Button, MD  diclofenac Sodium (VOLTAREN) 1 % GEL Apply 2 g topically 4 (four) times daily as needed. 07/26/22  Yes Zola Button, MD  DULoxetine (CYMBALTA) 20 MG capsule TAKE 1 CAPSULE BY MOUTH EVERY DAY Patient taking differently: Take 20 mg by mouth daily. 01/07/22  Yes Jessy Oto, MD  furosemide (LASIX) 20 MG tablet Take 20 mg by mouth daily. 06/08/18  Yes [provider]  gabapentin (NEURONTIN) 100 MG capsule Take 1 capsule (100 mg total) by mouth at bedtime. Patient taking differently: Take 100 mg by mouth in the morning and at bedtime. 03/05/22  Yes Star Age, MD  isosorbide mononitrate (IMDUR) 30 MG 24 hr tablet Take 15 mg by mouth daily. 08/09/22  Yes [provider]  levETIRAcetam (KEPPRA) 500 MG tablet Take 1 tablet (500 mg total) by mouth 2 (two) times daily. 03/05/22  Yes Star Age, MD  lidocaine (LIDODERM) 5 % Place 1 patch onto the skin daily. Remove & Discard patch within 12 hours or as directed by MD 05/10/22  Yes Zola Button, MD  PAZEO 0.7 % SOLN Place 1 drop into both eyes at bedtime.  07/24/18  Yes [provider]  primidone (MYSOLINE) 250 MG tablet Take 1 tablet (250 mg total) by mouth 2 (two) times daily. 03/05/22  Yes Star Age, MD  RESTASIS 0.05 % ophthalmic emulsion Place 1 drop into both eyes 2 (two) times daily.  09/29/14  Yes [provider]  [place] test strip USE AS INSTRUCTED TO TEST ONCE A DAY DX CODE:E11.9 11/22/19   Benay Pike, MD  Accu-Chek Softclix Lancets lancets Use as instructed 09/23/20   Benay Pike, MD     Critical care time: 80 minutes in total    Critical Care time included multiple conversations with family throughout the day.   Freda Jackson, MD [facility] Office: [phone]   See Amion for personal pager PCCM on call pager [phone] until 7pm. Please call [facility] 7p-7a. [phone]

## 2022-09-22 NOTE — Consult Note (Signed)
Palliative Care Consult Note                                  Date: 09/22/2022   Patient Name: [name] [name]  DOB: 10/21/1930  MRN: [id number]  Age / Sex: 88 y.o., female  PCP: Zola Button, MD Referring Physician: Freddi Starr, MD  Reason for Consultation: {Reason for Consult:23484}  HPI/Patient Profile: 88 y.o. female  with past medical history of seizure disorder, essential tremor, HTN, HLD, and previous CVA who presented to [facility] ED on 09/16/2022 with leg weakness and shortness of breath.  In the ED, she was found to have new onset atrial fibrillation with RVR and was started on diltiazem drip.  She was initially admitted to FMTS.  Seen by cardiology and plan was for TEE with cardioversion.  On 10/27, she developed acute hypoxic respiratory failure and required BiPAP.  PCCM was consulted and she was transferred to ICU.  On 10/28-29, she developed GI bleeding and hemoglobin dropped to 8.1    Past Medical History:  Diagnosis Date   Anxiety    Diabetes ([place]) 10/27/2013   DVT (deep venous thrombosis) (HCC)    Essential tremor 10/27/2013   High cholesterol 1999   Hypertension    Ischemic stroke ([place]) 07/18/2014   Obesity, unspecified 10/27/2013   Pneumonia    Unspecified hereditary and idiopathic peripheral neuropathy 10/27/2013    Subjective:   I have reviewed medical records including progress notes, labs and imaging, received report from the team, and assessed the patient at bedside. She appears to be very weak.   I met with her son Kathrine Cords, another son, and granddaughters Jeneen Montgomery and Clyde Canterbury to discuss diagnosis, prognosis, GOC, EOL wishes, disposition, and options. Jeneen Montgomery is a Garment/textile technologist at [employer] and Clyde Canterbury is a Community education officer at a [employer] in [place].   I introduced Palliative Medicine as specialized medical care for people living with serious illness. It focuses on providing relief from the symptoms and stress of a  serious illness.   A brief life review was discussed.  Patient was born in [place], then later immigrated to the [place].  We discussed patient's current illness and what it means in the larger context of her ongoing co-morbidities. Current clinical status was reviewed.  Family verbalizes understanding that development of GI bleeding is a major complication in the setting of atrial fibrillation.   Created space and opportunity for family to express thoughts regarding patient's current medical situation. Values and goals of care important to patient and family were attempted to be elicited. The difference between full scope medical intervention and comfort care was considered.  Family reports that this is Fatemah's first hospitalization. They also report that prior to admission, she had decent functional status and fairly good quality of life.   We did discuss code status. Encouraged  to consider DNR/DNI status understanding evidenced based poor outcomes in similar hospitalized patients, as the cause of the arrest is likely associated with chronic/terminal disease rather than a reversible acute cardio-pulmonary event. She seems to indicate that she would agree that DNR is appropriate however she is not yet ready to make that decision.   At this time family are clear that they would like to continue current supportive interventions with watchful waiting.  They are agreeable to continue to gain answers through tests and will make stepwise decisions based off of that information.  Brief discussion was had around  goals if patient continues to decline - family are open to continue Short conversations around transition to full comfort care at that time.  Questions and concerns addressed. Patient/family encouraged to call with questions or concerns.      Review of Systems  Unable to perform ROS   Objective:   Primary Diagnoses: Present on Admission:  Atrial fibrillation with RVR (HCC)  Essential tremor   HTN (hypertension)  HLD (hyperlipidemia)   Physical Exam Vitals reviewed.  Constitutional:      General: She is not in acute distress.    Appearance: She is ill-appearing.  Pulmonary:     Effort: Pulmonary effort is normal.  Neurological:     Motor: Weakness present.     Vital Signs:  BP (!) 111/53   Pulse (!) 112   Temp 98.1 F (36.7 C) (Oral)   Resp (!) 41   Ht 5' (1.524 m)   Wt 72.2 kg   SpO2 98%   BMI 31.09 kg/m   Palliative Assessment/Data: PPS 20-30%     Assessment & Plan:   SUMMARY OF RECOMMENDATIONS   Code status changed to DNR/DNI Continue current supportive interventions  Primary Decision Maker: {Primary Decision VIFBP:79432}  Symptom Management:  ***  Prognosis:  {Palliative Care Prognosis:23504}  Discharge Planning:  {Palliative dispostion:23505}   Discussed with: ***    Thank you for allowing [place] to participate in the care of Kalaya B Zamor   Time Total: ***  Greater than 50%  of this time was spent counseling and coordinating care related to the above assessment and plan.  Signed by: Elie Confer, NP Palliative Medicine Team  Team Phone # [phone]  For individual providers, please see AMION

## 2022-09-22 NOTE — Consult Note (Signed)
Ref: Zola Button, MD   Subjective:  Heart rate marginally improved with digoxin use. Patient had some progress last evening with HR in 80's and she ate her dinner. This morning she started having maroon colored and bloody rectal BM.  CBC is pending. GI consulted. Patient on IV Protonix and off Eliquis.  Objective:  Vital Signs in the last 24 hours: Temp:  [97.5 F (36.4 C)-98 F (36.7 C)] 98 F (36.7 C) (10/29 0720) Pulse Rate:  [78-144] 117 (10/29 1000) Cardiac Rhythm: Atrial fibrillation (10/29 0720) Resp:  [17-38] 25 (10/29 1000) BP: (87-179)/(43-154) 101/69 (10/29 1000) SpO2:  [88 %-99 %] 99 % (10/29 1000) FiO2 (%):  [40 %-60 %] 40 % (10/29 0756) Weight:  [72.2 kg] 72.2 kg (10/29 0500)  Physical Exam: BP Readings from Last 1 Encounters:  09/22/22 101/69     Wt Readings from Last 1 Encounters:  09/22/22 72.2 kg    Weight change: 0.4 kg Body mass index is 31.09 kg/m. HEENT: [place]/AT, Eyes-Brown, Conjunctiva-Pink, Sclera-Non-icteric Neck: No JVD, No bruit, Trachea midline. Lungs:  Clear, Bilateral. Cardiac:  Irregular rhythm, normal S1 and S2, no S3. II/VI systolic murmur. Abdomen:  Soft, non-tender. BS present. Extremities:  No edema present. No cyanosis. No clubbing. CNS: AxOx3, Cranial nerves grossly intact, moves all 4 extremities.  Skin: Warm and dry.   Intake/Output from previous day: 10/28 0701 - 10/29 0700 In: 594.8 [I.V.:394.8; IV Piggyback:200] Out: 1600 [Urine:1600]    Lab Results: BMET    Component Value Date/Time   NA 136 09/22/2022 0207   NA 139 09/21/2022 0253   NA 138 09/20/2022 0035   NA 141 06/14/2022 1505   NA 141 04/26/2020 1708   NA 141 06/22/2018 1448   K 4.1 09/22/2022 0207   K 3.9 09/21/2022 0253   K 4.3 09/20/2022 0035   CL 94 (L) 09/22/2022 0207   CL 94 (L) 09/21/2022 0253   CL 96 (L) 09/20/2022 0035   CO2 35 (H) 09/22/2022 0207   CO2 33 (H) 09/21/2022 0253   CO2 31 09/20/2022 0035   GLUCOSE 118 (H) 09/22/2022 0207    GLUCOSE 135 (H) 09/21/2022 0253   GLUCOSE 126 (H) 09/20/2022 0035   BUN 54 (H) 09/22/2022 0207   BUN 39 (H) 09/21/2022 0253   BUN 27 (H) 09/20/2022 0035   BUN 17 06/14/2022 1505   BUN 19 04/26/2020 1708   BUN 14 06/22/2018 1448   CREATININE 0.80 09/22/2022 0207   CREATININE 0.90 09/21/2022 0253   CREATININE 0.95 09/20/2022 0035   CALCIUM 8.0 (L) 09/22/2022 0207   CALCIUM 8.4 (L) 09/21/2022 0253   CALCIUM 8.5 (L) 09/20/2022 0035   GFRNONAA >60 09/22/2022 0207   GFRNONAA >60 09/21/2022 0253   GFRNONAA 57 (L) 09/20/2022 0035   GFRAA 69 04/26/2020 1708   GFRAA >60 06/15/2019 0841   GFRAA >60 11/13/2018 1941   CBC    Component Value Date/Time   WBC 6.8 09/21/2022 0253   RBC 4.52 09/21/2022 0253   HGB 14.0 09/21/2022 0253   HGB 15.2 04/26/2020 1708   HCT 42.2 09/21/2022 0253   HCT 46.5 04/26/2020 1708   PLT 132 (L) 09/21/2022 0253   PLT 102 (L) 04/26/2020 1708   MCV 93.4 09/21/2022 0253   MCV 94 04/26/2020 1708   MCH 31.0 09/21/2022 0253   MCHC 33.2 09/21/2022 0253   RDW 14.1 09/21/2022 0253   RDW 12.8 04/26/2020 1708   LYMPHSABS 1.1 09/17/2022 0207   LYMPHSABS 1.6 04/26/2020 1708  MONOABS 0.8 09/17/2022 0207   EOSABS 0.0 09/17/2022 0207   EOSABS 0.0 04/26/2020 1708   BASOSABS 0.1 09/17/2022 0207   BASOSABS 0.0 04/26/2020 1708   HEPATIC Function Panel Recent Labs    09/20/22 0035 09/21/22 0253 09/22/22 0207  PROT 6.4* 6.2* 5.6*  ALBUMIN 2.8* 2.6* 2.4*  AST 57* 41 67*  ALT 61* 51* 62*  ALKPHOS 112 105 94   HEMOGLOBIN A1C Lab Results  Component Value Date   MPG 126 (H) 07/18/2014   CARDIAC ENZYMES Lab Results  Component Value Date   TROPONINI <0.30 03/24/2012   BNP No results for input(s): "PROBNP" in the last 8760 hours. TSH Recent Labs    09/16/22 1929  TSH 1.792   CHOLESTEROL Recent Labs    06/14/22 1505 09/17/22 0207  CHOL 180 161    Scheduled Meds:  atorvastatin  20 mg Oral QPM   Chlorhexidine Gluconate Cloth  6 each Topical Daily    cycloSPORINE  1 drop Both Eyes BID   diltiazem  90 mg Oral Q8H   DULoxetine  20 mg Oral Daily   gabapentin  100 mg Oral QHS   metoprolol tartrate  50 mg Oral TID   mouth rinse  15 mL Mouth Rinse 4 times per day   pantoprazole (PROTONIX) IV  40 mg Intravenous Q12H   polyethylene glycol  17 g Oral BID   potassium chloride  10 mEq Oral Daily   senna  1 tablet Oral Daily   Continuous Infusions:  albumin human 25 g (09/22/22 0948)   amiodarone 30 mg/hr (09/22/22 0816)   lactated ringers     levETIRAcetam Stopped (09/22/22 0329)   PRN Meds:.diclofenac Sodium, mouth rinse  Assessment/Plan: Acute GI bleed Atrial fibrillation with moderate ventricular response HTN HLD Obesity Old strokes Arthritis Parkinson's disease  Plan: GI consulted. Eliquis stopped. On IV pantoprazole bid. Reconsider TEE with cardioversion if stable tomorrow   LOS: 5 days   Time spent including chart review, lab review, examination, discussion with patient/Family : 30 min   Orpah Cobb  MD  09/22/2022, 10:41 AM

## 2022-09-23 ENCOUNTER — Encounter (HOSPITAL_COMMUNITY)
Admission: EM | Disposition: A | Discharge status: Rehabilitation Facility | Payer: Self-pay | Source: Home / Self Care | Attending: Pulmonary Disease

## 2022-09-23 DIAGNOSIS — Z515 Encounter for palliative care: Secondary | ICD-10-CM | POA: Diagnosis not present

## 2022-09-23 DIAGNOSIS — I5033 Acute on chronic diastolic (congestive) heart failure: Secondary | ICD-10-CM | POA: Diagnosis not present

## 2022-09-23 DIAGNOSIS — I4891 Unspecified atrial fibrillation: Secondary | ICD-10-CM | POA: Diagnosis not present

## 2022-09-23 DIAGNOSIS — R0602 Shortness of breath: Secondary | ICD-10-CM

## 2022-09-23 LAB — CBC
HCT: 27.7 % — ABNORMAL LOW (ref 36.0–46.0)
HCT: 27.5 % — ABNORMAL LOW (ref 36.0–46.0)
HCT: 27.4 % — ABNORMAL LOW (ref 36.0–46.0)
HCT: 26.6 % — ABNORMAL LOW (ref 36.0–46.0)
Hemoglobin: 9.2 g/dL — ABNORMAL LOW (ref 12.0–15.0)
Hemoglobin: 9.2 g/dL — ABNORMAL LOW (ref 12.0–15.0)
Hemoglobin: 9.7 g/dL — ABNORMAL LOW (ref 12.0–15.0)
Hemoglobin: 9 g/dL — ABNORMAL LOW (ref 12.0–15.0)
MCH: 30.7 pg (ref 26.0–34.0)
MCH: 31 pg (ref 26.0–34.0)
MCH: 31.7 pg (ref 26.0–34.0)
MCH: 31.4 pg (ref 26.0–34.0)
MCHC: 33.2 g/dL (ref 30.0–36.0)
MCHC: 33.5 g/dL (ref 30.0–36.0)
MCHC: 35.4 g/dL (ref 30.0–36.0)
MCHC: 33.8 g/dL (ref 30.0–36.0)
MCV: 92.3 fL (ref 80.0–100.0)
MCV: 92.6 fL (ref 80.0–100.0)
MCV: 89.5 fL (ref 80.0–100.0)
MCV: 92.7 fL (ref 80.0–100.0)
Platelets: 97 10*3/uL — ABNORMAL LOW (ref 150–400)
Platelets: 97 10*3/uL — ABNORMAL LOW (ref 150–400)
Platelets: 95 10*3/uL — ABNORMAL LOW (ref 150–400)
Platelets: 104 10*3/uL — ABNORMAL LOW (ref 150–400)
RBC: 3 MIL/uL — ABNORMAL LOW (ref 3.87–5.11)
RBC: 2.97 MIL/uL — ABNORMAL LOW (ref 3.87–5.11)
RBC: 3.06 MIL/uL — ABNORMAL LOW (ref 3.87–5.11)
RBC: 2.87 MIL/uL — ABNORMAL LOW (ref 3.87–5.11)
RDW: 15.4 % (ref 11.5–15.5)
RDW: 15.6 % — ABNORMAL HIGH (ref 11.5–15.5)
RDW: 15.3 % (ref 11.5–15.5)
RDW: 15.5 % (ref 11.5–15.5)
WBC: 5.8 10*3/uL (ref 4.0–10.5)
WBC: 7.8 10*3/uL (ref 4.0–10.5)
WBC: 8.3 10*3/uL (ref 4.0–10.5)
WBC: 7.5 10*3/uL (ref 4.0–10.5)
nRBC: 0 % (ref 0.0–0.2)
nRBC: 0 % (ref 0.0–0.2)
nRBC: 0 % (ref 0.0–0.2)
nRBC: 0 % (ref 0.0–0.2)

## 2022-09-23 LAB — BASIC METABOLIC PANEL
Anion gap: 9 (ref 5–15)
BUN: 42 mg/dL — ABNORMAL HIGH (ref 8–23)
CO2: 36 mmol/L — ABNORMAL HIGH (ref 22–32)
Calcium: 8.5 mg/dL — ABNORMAL LOW (ref 8.9–10.3)
Chloride: 95 mmol/L — ABNORMAL LOW (ref 98–111)
Creatinine, Ser: 0.67 mg/dL (ref 0.44–1.00)
GFR, Estimated: >60 mL/min (ref >60)
Glucose, Bld: 132 mg/dL — ABNORMAL HIGH (ref 70–99)
Potassium: 3.8 mmol/L (ref 3.5–5.1)
Sodium: 140 mmol/L (ref 135–145)

## 2022-09-23 LAB — PREPARE RBC (CROSSMATCH)

## 2022-09-23 LAB — MAGNESIUM: Magnesium: 2.2 mg/dL (ref 1.7–2.4)

## 2022-09-23 SURGERY — CARDIOVERSION
Anesthesia: General

## 2022-09-23 MED ORDER — LEVETIRACETAM 500 MG PO TABS
500.0000 mg | ORAL_TABLET | Freq: Two times a day (BID) | ORAL | Status: DC
  Administered 2022-09-23 – 2022-10-04 (×22): 500 mg via ORAL
  Filled 2022-09-23 (×23): qty 1

## 2022-09-23 MED ORDER — DILTIAZEM HCL ER 60 MG PO CP12
60.0000 mg | ORAL_CAPSULE | Freq: Three times a day (TID) | ORAL | Status: DC
  Administered 2022-09-23 (×2): 60 mg via ORAL
  Filled 2022-09-23 (×4): qty 1

## 2022-09-23 MED ORDER — BOOST PLUS PO LIQD
237.0000 mL | Freq: Three times a day (TID) | ORAL | Status: DC
  Administered 2022-09-23 – 2022-10-04 (×22): 237 mL via ORAL
  Filled 2022-09-23 (×35): qty 237

## 2022-09-23 MED ORDER — SODIUM CHLORIDE 0.9 % IV SOLN: INTRAVENOUS | Status: DC

## 2022-09-23 MED ORDER — POLYETHYLENE GLYCOL 3350 17 G PO PACK
17.0000 g | PACK | Freq: Two times a day (BID) | ORAL | Status: DC | PRN
  Administered 2022-09-25: 17 g via ORAL
  Filled 2022-09-23: qty 1

## 2022-09-23 MED ORDER — SODIUM CHLORIDE 0.9 % IV SOLN
2.0000 g | INTRAVENOUS | Status: AC
  Administered 2022-09-23 – 2022-09-24 (×2): 2 g via INTRAVENOUS
  Filled 2022-09-23 (×2): qty 20

## 2022-09-23 MED ORDER — SENNA 8.6 MG PO TABS
1.0000 | ORAL_TABLET | Freq: Every day | ORAL | Status: DC | PRN
  Administered 2022-09-25: 8.6 mg via ORAL
  Filled 2022-09-23: qty 1

## 2022-09-23 MED ORDER — DIGOXIN 0.25 MG/ML IJ SOLN
0.1250 mg | Freq: Every day | INTRAMUSCULAR | Status: DC
  Administered 2022-09-23: 0.125 mg via INTRAVENOUS
  Filled 2022-09-23 (×2): qty 0.5

## 2022-09-23 NOTE — Progress Notes (Signed)
Brief Nutrition Note  Discussed pt in rounds. Currently on a clear liquid diet but family does not want a GI workup. Discussed with resident, ok to advance diet. Spoke with family at bedside, pt was eating mostly soft foods at home. Will advance to DYS 3 and also add ensure as family is currently bringing them in from home. Pt like chocolate and strawberry.  No plan for RD to follow at this time, please submit a consult if nutrition needs arise this hospitalization.    Ranell Patrick, RD, LDN Clinical Dietitian RD pager # available in [place]  After hours/weekend pager # available in [facility]

## 2022-09-23 NOTE — TOC Progression Note (Signed)
Transition of Care [facility]) - Progression Note    Patient Details  Name: [name] [name] MRN: [id number] Date of Birth: [birth year]  [facility] CM/SW Contact  Joanne Chars, LCSW Phone Number: 09/23/2022, 3:36 PM  Clinical Narrative:   CSW LM with [facility] Leech with DC date estimate, asked if she can review and respond if can offer bed.      Expected Discharge Plan: [place] Barriers to Discharge: Continued Medical Work up  Expected Discharge Plan and Services Expected Discharge Plan: [place] arrangements for the past 2 months: Single Family Home                                       Social Determinants of Health (SDOH) Interventions    Readmission Risk Interventions     No data to display

## 2022-09-23 NOTE — Progress Notes (Signed)
Tried to decrease HF to 20-25 L however, WOB increased.  Kept at 30L/40%.

## 2022-09-23 NOTE — Progress Notes (Signed)
Physical Therapy Treatment Patient Details Name: [name] [name] MRN: [id number] DOB: 10/01/1930 Today's Date: 09/23/2022   History of Present Illness Pt is an 88 y.o. female admitted 09/16/22 with worsening BLE weakness, SOB. Pt with new onset AFib with RVR as well as new CHF and GIB. PMH includes HTN, DVT, DM, essential tremor, stroke (2015), anxiety.    PT Comments    Pt with granddaughter [name] [name] present to interpret. Pt able to transition to EOB, stand with +2 assist on HHFNC but unable to maintain sitting and standing balance requiring return to supine. Pt will need lift for safe transition to chair at this time. Family stating hopeful for return home but recognizing need for significant assist and reconsidering SNF. Will continue to follow and encouraged AAROM with family during day.   [place] 30L, 40% with Sats 96-99 HR 109-147 with highly fluctuant rate maintaining <120 majority of session    Recommendations for follow up therapy are one component of a multi-disciplinary discharge planning process, led by the attending physician.  Recommendations may be updated based on patient status, additional functional criteria and insurance authorization.  Follow Up Recommendations  Skilled nursing-short term rehab (<3 hours/day) Can patient physically be transported by private vehicle: No   Assistance Recommended at Discharge Frequent or constant Supervision/Assistance  Patient can return home with the following Two people to help with walking and/or transfers;Two people to help with bathing/dressing/bathroom;Direct supervision/assist for medications management;Assistance with feeding;Assist for transportation;Assistance with cooking/housework;Direct supervision/assist for financial management   Equipment Recommendations  Hospital bed;Wheelchair (measurements PT);BSC/3in1;Other (comment) (hoyer)    Recommendations for Other Services       Precautions / Restrictions  Precautions Precautions: Fall;Other (comment) Precaution Comments: Watch HR and O2     Mobility  Bed Mobility Overal bed mobility: Needs Assistance Bed Mobility: Rolling, Sidelying to Sit, Sit to Supine Rolling: Min assist Sidelying to sit: Mod assist Supine to sit: Total assist, +2 for physical assistance     General bed mobility comments: rolling bil for pad placement with multimodal cues, physical assist to rise from side to sitting and to stabilize. Pt with total assist to return to supine, total +2 to slide toward [facility]    Transfers Overall transfer level: Needs assistance   Transfers: Sit to/from Stand Sit to Stand: Max assist, +2 physical assistance           General transfer comment: Max +2 assist to stand from bed x 3 trials with bil knees blocked and use of pad and belt to control sacrum. Pt with left lean in sitting and standing and unable to fully achieve standing, unsafe to attempt pivot to chair at this time. With return to supine    Ambulation/Gait                   Stairs             Wheelchair Mobility    Modified Rankin (Stroke Patients Only)       Balance Overall balance assessment: Needs assistance Sitting-balance support: No upper extremity supported, Feet unsupported Sitting balance-Leahy Scale: Poor Sitting balance - Comments: left lean in sitting with physical assist mod>min assist over grossly 5 min EOB   Standing balance support: Bilateral upper extremity supported, During functional activity Standing balance-Leahy Scale: Zero Standing balance comment: knees blocked and physical assist to stand  Cognition Arousal/Alertness: Awake/alert Behavior During Therapy: Flat affect Overall Cognitive Status: Difficult to assess Area of Impairment: Following commands, Problem solving                       Following Commands: Follows one step commands consistently, Follows one step  commands with increased time     Problem Solving: Slow processing, Requires verbal cues, Requires tactile cues          Exercises      General Comments        Pertinent Vitals/Pain Pain Assessment Pain Assessment: No/denies pain    Home Living                          Prior Function            PT Goals (current goals can now be found in the care plan section) Acute Rehab PT Goals Time For Goal Achievement: 10/07/22 Potential to Achieve Goals: Fair Progress towards PT goals: Not progressing toward goals - comment    Frequency    Min 3X/week      PT Plan Current plan remains appropriate    Co-evaluation              AM-PAC PT "6 Clicks" Mobility   Outcome Measure  Help needed turning from your back to your side while in a flat bed without using bedrails?: A Little Help needed moving from lying on your back to sitting on the side of a flat bed without using bedrails?: A Lot Help needed moving to and from a bed to a chair (including a wheelchair)?: Total Help needed standing up from a chair using your arms (e.g., wheelchair or bedside chair)?: Total Help needed to walk in hospital room?: Total Help needed climbing 3-5 steps with a railing? : Total 6 Click Score: 9    End of Session Equipment Utilized During Treatment: Gait belt;Oxygen Activity Tolerance: Patient tolerated treatment well Patient left: with call bell/phone within reach;in bed;with family/visitor present Nurse Communication: Mobility status;Need for lift equipment PT Visit Diagnosis: Unsteadiness on feet (R26.81);Muscle weakness (generalized) (M62.81);Difficulty in walking, not elsewhere classified (R26.2)     Time: 7673-4193 PT Time Calculation (min) (ACUTE ONLY): 32 min  Charges:  $Therapeutic Activity: 23-37 mins                     [name] [name], PT Acute Rehabilitation Services Office: [phone]    [name] [name] 09/23/2022, 9:34 AM

## 2022-09-23 NOTE — Progress Notes (Signed)
Palliative Medicine Progress Note   Patient Name: [name] [name]       Date: 09/23/2022 DOB: [birth year]  Age: 88 y.o. MRN#: [id number] Attending Physician: Freddi Starr, MD Primary Care Physician: Zola Button, MD Admit Date: 09/16/2022  Reason for Consultation/Follow-up: {Reason for Consult:23484}  HPI/Patient Profile: 88 y.o. female  with past medical history of seizure disorder, essential tremor, HTN, HLD, and previous CVA who presented to [facility] ED on 09/16/2022 with leg weakness and shortness of breath.  In the ED, she was found to have new onset atrial fibrillation with RVR and was started on diltiazem drip.  She was initially admitted to FMTS.  Seen by cardiology and plan was for TEE with cardioversion.  On 10/27, she developed acute hypoxic respiratory failure and required BiPAP.  PCCM was consulted and she was transferred to ICU.  On 10/28-29, she developed GI bleeding. Palliative Medicine was consulted for goals of care.   Subjective: ***  Objective:  Physical Exam          Vital Signs: BP 105/62   Pulse (!) 128   Temp 99.7 F (37.6 C) (Axillary)   Resp (!) 29   Ht 5' (1.524 m)   Wt 71.9 kg   SpO2 96%   BMI 30.96 kg/m  SpO2: SpO2: 96 % O2 Device: O2 Device: High Flow Nasal Cannula O2 Flow Rate: O2 Flow Rate (L/min): 15 L/min  Intake/output summary:  Intake/Output Summary (Last 24 hours) at 09/23/2022 1807 Last data filed at 09/23/2022 1700 Gross per 24 hour  Intake 1048.33 ml  Output 750 ml  Net 298.33 ml    LBM: Last BM Date : 09/23/22     Palliative Assessment/Data: ***     Palliative Medicine Assessment & Plan   Assessment: Principal Problem:   Atrial fibrillation with RVR (HCC) Active Problems:   Essential tremor   History of peripheral  neuropathy   HTN (hypertension)   HLD (hyperlipidemia)   Acute on chronic diastolic heart failure (HCC)   Left hip pain    Recommendations/Plan: ***  Goals of Care and Additional Recommendations: Limitations on Scope of Treatment: {Recommended Scope and Preferences:21019}  Code Status:   Prognosis:  {Palliative Care Prognosis:23504}  Discharge Planning: {Palliative dispostion:23505}  Care plan was discussed with ***  Thank you for allowing the Palliative Medicine Team  to assist in the care of this patient.   ***   Lavena Bullion, NP   Please contact Palliative Medicine Team phone at [phone] for questions and concerns.  For individual providers, please see AMION.

## 2022-09-23 NOTE — Progress Notes (Signed)
NAME:  [name] [name], MRN:  [id number], DOB:  1930-09-03, LOS: 6 ADMISSION DATE:  09/16/2022, CONSULTATION DATE:  10/27 REFERRING MD:  Cardiology, CHIEF COMPLAINT:  Afib, Acute respiratory failure   History of Present Illness:  88 year old female with known atrial fibrillation rapid ventricular response who is scheduled for cardioversion but subsequently has developed acute respiratory distress requiring noninvasive imaging treated with aggressive diuresis.  She will need to be transferred to intensive care unit for closer evaluation.  She may need to be intubated for the cardioversion.  Have discussed this with cardiology who agrees to give the Lasix chance to work she may not require intubation.  Pertinent  Medical History   Past Medical History:  Diagnosis Date   Anxiety    Diabetes ([place]) 10/27/2013   DVT (deep venous thrombosis) ([place])    Essential tremor 10/27/2013   High cholesterol 1999   Hypertension    Ischemic stroke ([place]) 07/18/2014   Obesity, unspecified 10/27/2013   Pneumonia    Unspecified hereditary and idiopathic peripheral neuropathy 10/27/2013   Significant Hospital Events: Including procedures, antibiotic start and stop dates in addition to other pertinent events   10/27 Admitted to ICU 10/29 CT head no acute intracranial abnormalities 10/29 chest x-ray moderate bilateral pleural effusion, R>L  Interim History / Subjective:  GI bleed overnight, GI consulted Family refused endoscopic evaluation CT abdomen pelvis Plan for supportive care, status post 1 unit PRBC  Objective   Blood pressure 97/64, pulse 87, temperature 97.9 F (36.6 C), temperature source Axillary, resp. rate (!) 31, height 5' (1.524 m), weight 71.9 kg, SpO2 100 %.    FiO2 (%):  [40 %] 40 %   Intake/Output Summary (Last 24 hours) at 09/23/2022 0724 Last data filed at 09/23/2022 0700 Gross per 24 hour  Intake 1770.6 ml  Output 600 ml  Net 1170.6 ml   Filed Weights   09/21/22 0500  09/22/22 0500 09/23/22 0343  Weight: 71.8 kg 72.2 kg 71.9 kg    Examination: General: Acutely ill elderly woman laying in bed.  NAD. HENT: [place]/AT.  Moist mucous membrane. Lungs: ON HFNC. Decreased air movement anterior lung sounds. Distant crackles at the bases. Cardiovascular: Mildy tachy. Irregularly irregular rhythm. No m/r/g Abdomen: Soft.  NT/ND.  Normal BS. Extremities: Warm and well perfused.  Trace BLE pitting edema Neuro: Sleepy but easily arousable. Moving all extremities  K+ 3.8, bicarb 36, sCr 0.67 WBC 5.8, Hgb 9.2, plt 97 Repeat chest x-ray yesterday shows moderate bilateral pleural effusions  Resolved Hospital Problem list    Assessment & Plan:  Acute hypoxic respiratory failure Acute on chronic diastolic heart failure Pulmonary edema Bilateral pleural effusions In the setting of A-fib with RVR. Continues to remain on HFNC.  Unable to tolerate BiPAP overnight due to the mask.  Repeat chest x-ray yesterday showed moderate bilateral pleural effusions.  Plan to hold on further diuresis for now due to soft BPs.  Discussed with family about possibility of doing a thoracentesis to help improve her pleural effusions. -IV lasix later today if BP is stable -Continue supplemental O2, wean as able -Strict I&O's, daily weight -Pending bedside ultrasound to look for fluid pocket  GI bleed Patient found to have large melanotic stool yesterday with mild drop in hemoglobin to 7.0.  GI consulted, however family would like to hold off any invasive procedures.  Status post 1 unit PRBC with improvement in hemoglobin to 9.2 this morning.  No further melenic stools this morning. -GI following, appreciate recs -Continue  IV PPI BID -Continue to hold Eliquis -Trend CBC -Transfuse for Hgb < 7   Atrial fibrillation Patient remains in A-fib with better rate control since last night after digoxin load x2 yesterday.  HR in the 80s to 90s at rest.  Elevated to the 120s during ambulation with PT.   BP soft with SBP in the 100s.  -Cardiology following, appreciate recs -Pending cardioversion -Continue IV amiodarone -Decrease diltiazem from 90 to 60 mg q8h -Continue to hold Eliquis -Trend and replete electrolytes  HTN BP remained soft with SBP 90s to 100s.  Plan to hold further diuresis for now. -Hold morning dose of metoprolol -CTM -MAP goal >65  HLD -Continue Lipitor 20 mg daily   Essential tremor Anxiety Per granddaughter, patient does not have a diagnosis of Parkinson's.  She does have essential tremor that has been stable. -Continue keppra -Continue gabapentin and duloxetine -CTM  Generalized deconditioning Goals of care PT recommended SNF placement.  Granddaughter plans to speak with the rest of family as original plan was for patient to come back home with palliative care. -TOC consult -Palliative care following, appreciate recs  Best Practice (right click and "Reselect all SmartList Selections" daily)   Diet/type: dysphagia diet (see orders) DVT prophylaxis: SCD GI prophylaxis: PPI Lines: N/A Foley:  N/A Code Status:  DNR Last date of multidisciplinary goals of care discussion [Updated family at bedside]  Labs   CBC: Recent Labs  Lab 09/16/22 1929 09/17/22 0207 09/20/22 0922 09/21/22 0253 09/22/22 1154 09/22/22 1647 09/22/22 2238  WBC 7.7 6.7 7.5 6.8 5.3 6.4 4.9  NEUTROABS 5.7 4.7  --   --   --   --   --   HGB 15.2* 14.6 14.8 14.0 8.1* 7.9* 7.0*  HCT 46.1* 44.4 45.5 42.2 25.8* 24.2* 21.2*  MCV 95.4 94.9 95.2 93.4 98.9 94.9 94.2  PLT 100* 96* 121* 132* 100* 104* 99*    Basic Metabolic Panel: Recent Labs  Lab 09/18/22 0115 09/19/22 0022 09/20/22 0035 09/21/22 0253 09/22/22 0207  NA 137 134* 138 139 136  K 4.0 4.6 4.3 3.9 4.1  CL 99 94* 96* 94* 94*  CO2 30 31 31  33* 35*  GLUCOSE 127* 129* 126* 135* 118*  BUN 19 21 27* 39* 54*  CREATININE 0.70 0.86 0.95 0.90 0.80  CALCIUM 8.5* 8.3* 8.5* 8.4* 8.0*  MG 2.0 2.1 2.0 2.0 1.9    GFR: Estimated Creatinine Clearance: 42.2 mL/min (by C-G formula based on SCr of 0.8 mg/dL). Recent Labs  Lab 09/21/22 0253 09/22/22 1154 09/22/22 1647 09/22/22 2238  WBC 6.8 5.3 6.4 4.9    Liver Function Tests: Recent Labs  Lab 09/16/22 1929 09/17/22 0207 09/20/22 0035 09/21/22 0253 09/22/22 0207  AST 48* 51* 57* 41 67*  ALT 59* 59* 61* 51* 62*  ALKPHOS 110 106 112 105 94  BILITOT 0.6 0.6 0.7 0.7 0.4  PROT 7.1 6.9 6.4* 6.2* 5.6*  ALBUMIN 3.2* 3.1* 2.8* 2.6* 2.4*   No results for input(s): "LIPASE", "AMYLASE" in the last 168 hours. No results for input(s): "AMMONIA" in the last 168 hours.  ABG    Component Value Date/Time   PHART 7.43 09/20/2022 0833   PCO2ART 61 (H) 09/20/2022 0833   PO2ART 204 (H) 09/20/2022 0833   HCO3 41.2 (H) 09/20/2022 1554   TCO2 32 12/08/2010 2038   O2SAT 90.2 09/20/2022 1554     Coagulation Profile: No results for input(s): "INR", "PROTIME" in the last 168 hours.  Cardiac Enzymes: No results for input(s): "  CKTOTAL", "CKMB", "CKMBINDEX", "TROPONINI" in the last 168 hours.  HbA1C: Hemoglobin A1C  Date/Time Value Ref Range Status  11/30/2020 12:09 PM 5.4 4.0 - 5.6 % Final   HbA1c, POC (controlled diabetic range)  Date/Time Value Ref Range Status  06/14/2022 12:15 PM 5.6 0.0 - 7.0 % Final  12/17/2018 03:56 PM 5.3 0.0 - 7.0 % Final    CBG: Recent Labs  Lab 09/20/22 1145  GLUCAP 126*    Review of Systems:   As in HPI  Past Medical History:  She,  has a past medical history of Anxiety, Diabetes ([employer]) (10/27/2013), DVT (deep venous thrombosis) ([place]), Essential tremor (10/27/2013), High cholesterol (1999), Hypertension, Ischemic stroke ([place]) (07/18/2014), Obesity, unspecified (10/27/2013), Pneumonia, and Unspecified hereditary and idiopathic peripheral neuropathy (10/27/2013).   Surgical History:   Past Surgical History:  Procedure Laterality Date   CATARACT EXTRACTION Bilateral    FOOT SURGERY     IR KYPHO LUMBAR INC FX  REDUCE BONE BX UNI/BIL CANNULATION INC/IMAGING  06/15/2019     Social History:   reports that she has never smoked. She has never used smokeless tobacco. She reports that she does not drink alcohol and does not use drugs.   Family History:  Her family history includes Coronary artery disease in an other family member; Heart Problems in her brother and sister; Stroke in her mother. There is no history of Tremor.   Allergies Allergies  Allergen Reactions   Tape Rash    Please do not use Plastic Tape     Home Medications  Prior to Admission medications   Medication Sig Start Date End Date Taking? Authorizing Provider  acetaminophen (TYLENOL) 325 MG tablet Take 2 tablets (650 mg total) by mouth every 6 (six) hours as needed. Patient taking differently: Take 325 mg by mouth every 6 (six) hours as needed for moderate pain. 06/01/17  Yes Varney Biles, MD  ALPRAZolam (XANAX) 0.25 MG tablet Take 0.25 mg by mouth 2 (two) times daily as needed for anxiety.    Yes [provider]  amLODipine (NORVASC) 2.5 MG tablet Take 2.5 mg by mouth daily. 05/29/17  Yes [provider]  atorvastatin (LIPITOR) 20 MG tablet TAKE 1 TABLET BY MOUTH EVERY DAY IN THE EVENING Patient taking differently: Take 20 mg by mouth every evening. 05/10/22  Yes Zola Button, MD  CALCIUM PO Take 1 tablet by mouth daily.   Yes [provider]  Cholecalciferol (VITAMIN D PO) Take 1 tablet by mouth daily.   Yes [provider]  clopidogrel (PLAVIX) 75 MG tablet TAKE 1 TABLET BY MOUTH EVERY DAY Patient taking differently: Take 75 mg by mouth once. 06/17/22  Yes Zola Button, MD  diclofenac Sodium (VOLTAREN) 1 % GEL Apply 2 g topically 4 (four) times daily as needed. 07/26/22  Yes Zola Button, MD  DULoxetine (CYMBALTA) 20 MG capsule TAKE 1 CAPSULE BY MOUTH EVERY DAY Patient taking differently: Take 20 mg by mouth daily. 01/07/22  Yes Jessy Oto, MD  furosemide (LASIX) 20 MG tablet Take 20 mg by  mouth daily. 06/08/18  Yes [provider]  gabapentin (NEURONTIN) 100 MG capsule Take 1 capsule (100 mg total) by mouth at bedtime. Patient taking differently: Take 100 mg by mouth in the morning and at bedtime. 03/05/22  Yes Star Age, MD  isosorbide mononitrate (IMDUR) 30 MG 24 hr tablet Take 15 mg by mouth daily. 08/09/22  Yes [provider]  levETIRAcetam (KEPPRA) 500 MG tablet Take 1 tablet (500 mg total)  by mouth 2 (two) times daily. 03/05/22  Yes Star Age, MD  lidocaine (LIDODERM) 5 % Place 1 patch onto the skin daily. Remove & Discard patch within 12 hours or as directed by MD 05/10/22  Yes Zola Button, MD  PAZEO 0.7 % SOLN Place 1 drop into both eyes at bedtime.  07/24/18  Yes [provider]  primidone (MYSOLINE) 250 MG tablet Take 1 tablet (250 mg total) by mouth 2 (two) times daily. 03/05/22  Yes Star Age, MD  RESTASIS 0.05 % ophthalmic emulsion Place 1 drop into both eyes 2 (two) times daily.  09/29/14  Yes [provider]  [place] test strip USE AS INSTRUCTED TO TEST ONCE A DAY DX CODE:E11.9 11/22/19   Benay Pike, MD  Accu-Chek Softclix Lancets lancets Use as instructed 09/23/20   Benay Pike, MD     Critical care time: 52

## 2022-09-24 ENCOUNTER — Inpatient Hospital Stay (HOSPITAL_COMMUNITY): Payer: Medicare Other

## 2022-09-24 ENCOUNTER — Encounter (HOSPITAL_COMMUNITY): Payer: Self-pay | Admitting: Anesthesiology

## 2022-09-24 ENCOUNTER — Encounter (HOSPITAL_COMMUNITY)
Admission: EM | Disposition: A | Discharge status: Rehabilitation Facility | Payer: Self-pay | Source: Home / Self Care | Attending: Pulmonary Disease

## 2022-09-24 DIAGNOSIS — I4891 Unspecified atrial fibrillation: Secondary | ICD-10-CM | POA: Diagnosis not present

## 2022-09-24 LAB — COMPREHENSIVE METABOLIC PANEL
ALT: 66 U/L — ABNORMAL HIGH (ref 0–44)
AST: 71 U/L — ABNORMAL HIGH (ref 15–41)
Albumin: 3 g/dL — ABNORMAL LOW (ref 3.5–5.0)
Alkaline Phosphatase: 57 U/L (ref 38–126)
Anion gap: 9 (ref 5–15)
BUN: 34 mg/dL — ABNORMAL HIGH (ref 8–23)
CO2: 33 mmol/L — ABNORMAL HIGH (ref 22–32)
Calcium: 8.6 mg/dL — ABNORMAL LOW (ref 8.9–10.3)
Chloride: 96 mmol/L — ABNORMAL LOW (ref 98–111)
Creatinine, Ser: 0.72 mg/dL (ref 0.44–1.00)
GFR, Estimated: >60 mL/min (ref >60)
Glucose, Bld: 137 mg/dL — ABNORMAL HIGH (ref 70–99)
Potassium: 4 mmol/L (ref 3.5–5.1)
Sodium: 138 mmol/L (ref 135–145)
Total Bilirubin: 0.4 mg/dL (ref 0.3–1.2)
Total Protein: 5.6 g/dL — ABNORMAL LOW (ref 6.5–8.1)

## 2022-09-24 LAB — TYPE AND SCREEN
ABO/RH(D): O POS
Antibody Screen: NEGATIVE
Unit division: 0

## 2022-09-24 LAB — BPAM RBC
Blood Product Expiration Date: 202311232359
ISSUE DATE / TIME: 202310300043
Unit Type and Rh: 5100

## 2022-09-24 LAB — CBC
HCT: 26.9 % — ABNORMAL LOW (ref 36.0–46.0)
Hemoglobin: 9 g/dL — ABNORMAL LOW (ref 12.0–15.0)
MCH: 31.6 pg (ref 26.0–34.0)
MCHC: 33.5 g/dL (ref 30.0–36.0)
MCV: 94.4 fL (ref 80.0–100.0)
Platelets: 101 10*3/uL — ABNORMAL LOW (ref 150–400)
RBC: 2.85 MIL/uL — ABNORMAL LOW (ref 3.87–5.11)
RDW: 15.3 % (ref 11.5–15.5)
WBC: 7.6 10*3/uL (ref 4.0–10.5)
nRBC: 0 % (ref 0.0–0.2)

## 2022-09-24 LAB — MAGNESIUM: Magnesium: 2 mg/dL (ref 1.7–2.4)

## 2022-09-24 LAB — DIGOXIN LEVEL: Digoxin Level: 1.2 ng/mL (ref 0.8–2.0)

## 2022-09-24 SURGERY — CANCELLED PROCEDURE

## 2022-09-24 MED ORDER — METOPROLOL TARTRATE 25 MG PO TABS
50.0000 mg | ORAL_TABLET | Freq: Three times a day (TID) | ORAL | Status: DC
  Administered 2022-09-24 (×2): 50 mg via ORAL
  Filled 2022-09-24 (×2): qty 2

## 2022-09-24 MED ORDER — MIDAZOLAM-SODIUM CHLORIDE 100-0.9 MG/100ML-% IV SOLN: 0.5000 mg/h | INTRAVENOUS | Status: DC

## 2022-09-24 MED ORDER — DIGOXIN 0.25 MG/ML IJ SOLN
0.0625 mg | Freq: Every day | INTRAMUSCULAR | Status: DC
  Filled 2022-09-24: qty 0.25

## 2022-09-24 MED ORDER — NOREPINEPHRINE 4 MG/250ML-% IV SOLN: 0.0000 ug/min | INTRAVENOUS | Status: DC

## 2022-09-24 MED ORDER — PROPOFOL 1000 MG/100ML IV EMUL: 5.0000 ug/kg/min | INTRAVENOUS | Status: DC

## 2022-09-24 MED ORDER — DILTIAZEM HCL ER 60 MG PO CP12
60.0000 mg | ORAL_CAPSULE | Freq: Three times a day (TID) | ORAL | Status: DC
  Administered 2022-09-24 (×2): 60 mg via ORAL
  Filled 2022-09-24 (×4): qty 1

## 2022-09-24 MED ORDER — SODIUM CHLORIDE 0.9 % IV BOLUS: 1000.0000 mL | Freq: Once | INTRAVENOUS | Status: DC

## 2022-09-24 MED ORDER — POTASSIUM CHLORIDE CRYS ER 10 MEQ PO TBCR
10.0000 meq | EXTENDED_RELEASE_TABLET | Freq: Every day | ORAL | Status: DC
  Administered 2022-09-24 – 2022-09-26 (×3): 10 meq via ORAL
  Filled 2022-09-24 (×6): qty 1

## 2022-09-24 MED ORDER — ROCURONIUM BROMIDE 50 MG/5ML IV SOLN
100.0000 mg | Freq: Once | INTRAVENOUS | Status: DC
  Filled 2022-09-24: qty 10

## 2022-09-24 MED ORDER — FENTANYL CITRATE PF 50 MCG/ML IJ SOSY: 100.0000 ug | PREFILLED_SYRINGE | Freq: Once | INTRAMUSCULAR | Status: DC

## 2022-09-24 MED ORDER — DIGOXIN 125 MCG PO TABS
0.0625 mg | ORAL_TABLET | Freq: Every day | ORAL | Status: DC
  Administered 2022-09-24 – 2022-10-04 (×11): 0.0625 mg via ORAL
  Filled 2022-09-24 (×11): qty 1

## 2022-09-24 MED ORDER — DULOXETINE HCL 20 MG PO CPEP
20.0000 mg | ORAL_CAPSULE | Freq: Every day | ORAL | Status: DC
  Administered 2022-09-24 – 2022-10-04 (×10): 20 mg via ORAL
  Filled 2022-09-24 (×11): qty 1

## 2022-09-24 MED ORDER — MIDAZOLAM HCL 2 MG/2ML IJ SOLN
INTRAMUSCULAR | Status: AC
  Administered 2022-09-24: 0.5 mg
  Filled 2022-09-24: qty 2

## 2022-09-24 MED ORDER — ETOMIDATE 2 MG/ML IV SOLN: 20.0000 mg | Freq: Once | INTRAVENOUS | Status: DC

## 2022-09-24 MED ORDER — NOREPINEPHRINE 4 MG/250ML-% IV SOLN
INTRAVENOUS | Status: AC
  Filled 2022-09-24: qty 250

## 2022-09-24 MED ORDER — NOREPINEPHRINE 4 MG/250ML-% IV SOLN: 2.0000 ug/min | INTRAVENOUS | Status: DC

## 2022-09-24 MED ORDER — FENTANYL CITRATE PF 50 MCG/ML IJ SOSY
PREFILLED_SYRINGE | INTRAMUSCULAR | Status: AC
  Filled 2022-09-24: qty 2

## 2022-09-24 MED ORDER — ROCURONIUM BROMIDE 10 MG/ML (PF) SYRINGE
PREFILLED_SYRINGE | INTRAVENOUS | Status: AC
  Filled 2022-09-24: qty 10

## 2022-09-24 MED ORDER — PANTOPRAZOLE SODIUM 40 MG PO TBEC
40.0000 mg | DELAYED_RELEASE_TABLET | Freq: Every day | ORAL | Status: DC
  Administered 2022-09-25 – 2022-09-27 (×3): 40 mg via ORAL
  Filled 2022-09-24 (×3): qty 1

## 2022-09-24 MED ORDER — MIDAZOLAM HCL 2 MG/2ML IJ SOLN
INTRAMUSCULAR | Status: AC
  Administered 2022-09-24: 2 mg
  Filled 2022-09-24: qty 2

## 2022-09-24 MED ORDER — PROPOFOL 1000 MG/100ML IV EMUL
INTRAVENOUS | Status: AC
  Administered 2022-09-24: 5 ug/kg/min
  Filled 2022-09-24: qty 100

## 2022-09-24 MED ORDER — MIDAZOLAM HCL 2 MG/2ML IJ SOLN: 4.0000 mg | Freq: Once | INTRAMUSCULAR | Status: AC

## 2022-09-24 NOTE — Progress Notes (Signed)
NAME:  [name] [name], MRN:  [id number], DOB:  1930-01-18, LOS: 7 ADMISSION DATE:  09/16/2022, CONSULTATION DATE:  10/27 REFERRING MD:  Cardiology, CHIEF COMPLAINT:  Afib, Acute respiratory failure   History of Present Illness:  88 year old female with known atrial fibrillation rapid ventricular response who is scheduled for cardioversion but subsequently has developed acute respiratory distress requiring noninvasive imaging treated with aggressive diuresis.  She will need to be transferred to intensive care unit for closer evaluation.  She may need to be intubated for the cardioversion.  Have discussed this with cardiology who agrees to give the Lasix chance to work she may not require intubation.  Pertinent  Medical History   Past Medical History:  Diagnosis Date   Anxiety    Diabetes ([place]) 10/27/2013   DVT (deep venous thrombosis) ([place])    Essential tremor 10/27/2013   High cholesterol 1999   Hypertension    Ischemic stroke ([place]) 07/18/2014   Obesity, unspecified 10/27/2013   Pneumonia    Unspecified hereditary and idiopathic peripheral neuropathy 10/27/2013   Significant Hospital Events: Including procedures, antibiotic start and stop dates in addition to other pertinent events   10/27 Admitted to ICU 10/29 CT head no acute intracranial abnormalities 10/29 chest x-ray moderate bilateral pleural effusion, R>L 10/31: TEE/DCCV under conscious sedation  Interim History / Subjective:  No acute events overnight Tolerated BiPAP briefly overnight Remains on HFNC at 20 L Plan for TEE and cardioversion under conscious sedation  Objective   Blood pressure 105/61, pulse 75, temperature 98.2 F (36.8 C), temperature source Axillary, resp. rate 20, height 5' (1.524 m), weight 71.9 kg, SpO2 99 %.    FiO2 (%):  [30 %-40 %] 30 %   Intake/Output Summary (Last 24 hours) at 09/24/2022 0709 Last data filed at 09/24/2022 0600 Gross per 24 hour  Intake 799.69 ml  Output 650 ml  Net 149.69  ml   Filed Weights   09/21/22 0500 09/22/22 0500 09/23/22 0343  Weight: 71.8 kg 72.2 kg 71.9 kg    Examination: General: Acutely ill elderly woman laying in bed.  NAD. HENT: [place]/AT.  Moist mucous membrane. Lungs: Remains on HFNC.  Decreased breath sounds anteriorly. Cardiovascular: Cardiac.  Irregularly irregular rhythm. No m/r/g Abdomen: Soft.  NT/ND.  Normal BS. Extremities: Warm and well perfused.  Trace BLE pitting edema Neuro: Remains drowsy this morning but easily arousable.  Moving all extremities.  K+ 4.0, bicarb 33, creatinine 0.72 Digoxin level 1.2, Hgb 9.0, platelet 104  Resolved Hospital Problem list    Assessment & Plan:  Acute hypoxic respiratory failure Acute on chronic diastolic heart failure Pulmonary edema Bilateral pleural effusions In the setting of A-fib with RVR.  Remains on HFNC at 20 L and FiO2 30%.  Able to tolerate some BiPAP overnight per family.  Plan to wean down O2 as tolerated. -Continue supplemental O2, wean for O2 sats >92% -Repeat CXR in the morning -Strict I&O's, daily weight  Atrial fibrillation Successful TEE and cardioversion under conscious sedation this morning.  Repeat EKG shows sinus rhythm with occasional PACs. Patient has a CHADVASc of 3, and HASBLED score of 3. Patient is at moderate to high risk of stroke as well as moderate to high risk of major bleed.  Discussed with family concerning patient's risk and benefits of resuming anticoagulation. Family would like GIs input concerning the timing for resume anticoagulation due to patient's recent GI bleed. -Cardiology following, appreciate recs -Continue IV amiodarone -Continue diltiazem 60 mg q8h -Continue metoprolol 50 mg  TID -Continue to hold Eliquis for now -Trend and replete electrolytes  GI bleed Patient found to have large melanotic stool on 10/29.  No evidence of GI bleeds since then. Hemoglobin has been stable at 9.0.  Family okay with resuming anticoagulation but would like GI  to decide if EGD is absolutely necessary before restarting anticoagulation. -GI following, plan to discuss with them concerning need for EGD -Continue IV PPI BID -Continue to hold Eliquis -F/u afternoon CBC -Transfuse for Hgb < 7   HTN Blood pressure has been stable with SBP in the 110s to 130s. -Continue metoprolol -CTM -MAP goal >65  Bacteriuria Microscopic hematuria Patient found to have positive nitrate, leukocytes, many bacteria, >50 WBCs, and >50 RBCs on UA. -Continue Rocephin (day 2 of 3)  HLD -Continue Lipitor 20 mg daily   Essential tremor Anxiety Per granddaughter, patient does not have a diagnosis of Parkinson's.  She does have essential tremor that has been stable. -Continue keppra -Continue gabapentin and duloxetine -CTM  Generalized deconditioning Goals of care PT recommended SNF placement.  Family reports they have secure private outpatient rehab for patient when she is ready for discharge home. -TOC following, appreciate recs -Palliative care following, appreciate recs  Best Practice (right click and "Reselect all SmartList Selections" daily)   Diet/type: clear liquids DVT prophylaxis: SCD GI prophylaxis: PPI Lines: N/A Foley:  N/A Code Status:  DNR Last date of multidisciplinary goals of care discussion [Updated family at bedside]  Labs   CBC: Recent Labs  Lab 09/22/22 2238 09/23/22 0642 09/23/22 1251 09/23/22 1805 09/23/22 2300  WBC 4.9 5.8 7.8 8.3 7.5  HGB 7.0* 9.2* 9.2* 9.7* 9.0*  HCT 21.2* 27.7* 27.5* 27.4* 26.6*  MCV 94.2 92.3 92.6 89.5 92.7  PLT 99* 97* 97* 95* 104*    Basic Metabolic Panel: Recent Labs  Lab 09/19/22 0022 09/20/22 0035 09/21/22 0253 09/22/22 0207 09/23/22 0642 09/24/22 0339  NA 134* 138 139 136 140 138  K 4.6 4.3 3.9 4.1 3.8 4.0  CL 94* 96* 94* 94* 95* 96*  CO2 31 31 33* 35* 36* 33*  GLUCOSE 129* 126* 135* 118* 132* 137*  BUN 21 27* 39* 54* 42* 34*  CREATININE 0.86 0.95 0.90 0.80 0.67 0.72  CALCIUM  8.3* 8.5* 8.4* 8.0* 8.5* 8.6*  MG 2.1 2.0 2.0 1.9 2.2  --    GFR: Estimated Creatinine Clearance: 42.2 mL/min (by C-G formula based on SCr of 0.72 mg/dL). Recent Labs  Lab 09/23/22 0642 09/23/22 1251 09/23/22 1805 09/23/22 2300  WBC 5.8 7.8 8.3 7.5    Liver Function Tests: Recent Labs  Lab 09/20/22 0035 09/21/22 0253 09/22/22 0207 09/24/22 0339  AST 57* 41 67* 71*  ALT 61* 51* 62* 66*  ALKPHOS 112 105 94 57  BILITOT 0.7 0.7 0.4 0.4  PROT 6.4* 6.2* 5.6* 5.6*  ALBUMIN 2.8* 2.6* 2.4* 3.0*   No results for input(s): "LIPASE", "AMYLASE" in the last 168 hours. No results for input(s): "AMMONIA" in the last 168 hours.  ABG    Component Value Date/Time   PHART 7.43 09/20/2022 0833   PCO2ART 61 (H) 09/20/2022 0833   PO2ART 204 (H) 09/20/2022 0833   HCO3 41.2 (H) 09/20/2022 1554   TCO2 32 12/08/2010 2038   O2SAT 90.2 09/20/2022 1554     Coagulation Profile: No results for input(s): "INR", "PROTIME" in the last 168 hours.  Cardiac Enzymes: No results for input(s): "CKTOTAL", "CKMB", "CKMBINDEX", "TROPONINI" in the last 168 hours.  HbA1C: Hemoglobin A1C  Date/Time  Value Ref Range Status  11/30/2020 12:09 PM 5.4 4.0 - 5.6 % Final   HbA1c, POC (controlled diabetic range)  Date/Time Value Ref Range Status  06/14/2022 12:15 PM 5.6 0.0 - 7.0 % Final  12/17/2018 03:56 PM 5.3 0.0 - 7.0 % Final    CBG: Recent Labs  Lab 09/20/22 1145  GLUCAP 126*    Review of Systems:   As in HPI  Past Medical History:  She,  has a past medical history of Anxiety, Diabetes ([place]) (10/27/2013), DVT (deep venous thrombosis) ([place]), Essential tremor (10/27/2013), High cholesterol (1999), Hypertension, Ischemic stroke ([place]) (07/18/2014), Obesity, unspecified (10/27/2013), Pneumonia, and Unspecified hereditary and idiopathic peripheral neuropathy (10/27/2013).   Surgical History:   Past Surgical History:  Procedure Laterality Date   CATARACT EXTRACTION Bilateral    FOOT SURGERY     IR  KYPHO LUMBAR INC FX REDUCE BONE BX UNI/BIL CANNULATION INC/IMAGING  06/15/2019     Social History:   reports that she has never smoked. She has never used smokeless tobacco. She reports that she does not drink alcohol and does not use drugs.   Family History:  Her family history includes Coronary artery disease in an other family member; Heart Problems in her brother and sister; Stroke in her mother. There is no history of Tremor.   Allergies Allergies  Allergen Reactions   Tape Rash    Please do not use Plastic Tape     Home Medications  Prior to Admission medications   Medication Sig Start Date End Date Taking? Authorizing Provider  acetaminophen (TYLENOL) 325 MG tablet Take 2 tablets (650 mg total) by mouth every 6 (six) hours as needed. Patient taking differently: Take 325 mg by mouth every 6 (six) hours as needed for moderate pain. 06/01/17  Yes Varney Biles, MD  ALPRAZolam (XANAX) 0.25 MG tablet Take 0.25 mg by mouth 2 (two) times daily as needed for anxiety.    Yes [provider]  amLODipine (NORVASC) 2.5 MG tablet Take 2.5 mg by mouth daily. 05/29/17  Yes [provider]  atorvastatin (LIPITOR) 20 MG tablet TAKE 1 TABLET BY MOUTH EVERY DAY IN THE EVENING Patient taking differently: Take 20 mg by mouth every evening. 05/10/22  Yes Zola Button, MD  CALCIUM PO Take 1 tablet by mouth daily.   Yes [provider]  Cholecalciferol (VITAMIN D PO) Take 1 tablet by mouth daily.   Yes [provider]  clopidogrel (PLAVIX) 75 MG tablet TAKE 1 TABLET BY MOUTH EVERY DAY Patient taking differently: Take 75 mg by mouth once. 06/17/22  Yes Zola Button, MD  diclofenac Sodium (VOLTAREN) 1 % GEL Apply 2 g topically 4 (four) times daily as needed. 07/26/22  Yes Zola Button, MD  DULoxetine (CYMBALTA) 20 MG capsule TAKE 1 CAPSULE BY MOUTH EVERY DAY Patient taking differently: Take 20 mg by mouth daily. 01/07/22  Yes Jessy Oto, MD  furosemide (LASIX) 20 MG  tablet Take 20 mg by mouth daily. 06/08/18  Yes [provider]  gabapentin (NEURONTIN) 100 MG capsule Take 1 capsule (100 mg total) by mouth at bedtime. Patient taking differently: Take 100 mg by mouth in the morning and at bedtime. 03/05/22  Yes Star Age, MD  isosorbide mononitrate (IMDUR) 30 MG 24 hr tablet Take 15 mg by mouth daily. 08/09/22  Yes [provider]  levETIRAcetam (KEPPRA) 500 MG tablet Take 1 tablet (500 mg total) by mouth 2 (two) times daily. 03/05/22  Yes Star Age, MD  lidocaine ([place])  5 % Place 1 patch onto the skin daily. Remove & Discard patch within 12 hours or as directed by MD 05/10/22  Yes Zola Button, MD  PAZEO 0.7 % SOLN Place 1 drop into both eyes at bedtime.  07/24/18  Yes [provider]  primidone (MYSOLINE) 250 MG tablet Take 1 tablet (250 mg total) by mouth 2 (two) times daily. 03/05/22  Yes Star Age, MD  RESTASIS 0.05 % ophthalmic emulsion Place 1 drop into both eyes 2 (two) times daily.  09/29/14  Yes [provider]  [place] test strip USE AS INSTRUCTED TO TEST ONCE A DAY DX CODE:E11.9 11/22/19   Benay Pike, MD  Accu-Chek Softclix Lancets lancets Use as instructed 09/23/20   Benay Pike, MD     Critical care time: 62

## 2022-09-24 NOTE — Progress Notes (Addendum)
Daily Rounding Note   09/24/2022, 2:17 PM  LOS: 7 days    SUBJECTIVE:   Chief complaint:    evaluate gi bleeding   Seen by GI on 10/29 for burgundy BM in setting eliquis, plavix, rapid a fib, CVA 2015.  Family chose not to pursue GI eval until the cardiac issues improved/resolved   Underwent successful cardioversion to SR today.  Medical staff now wanting to pursue GI studies. Currently off Plavix and AC so EGD timing optimal.  However spoke w son in room and he is not wanting EGD for mom until she is "out of the ICU".    Burgundy stools have resolved, stools are now brown. Continues on Protonix 40 mg IV bid.     Scheduled Meds:  atorvastatin  20 mg Oral QPM   Chlorhexidine Gluconate Cloth  6 each Topical Daily   cycloSPORINE  1 drop Both Eyes BID   digoxin  0.0625 mg Oral Daily   diltiazem  60 mg Oral Q8H   DULoxetine  20 mg Oral Daily   fentaNYL (SUBLIMAZE) injection  100 mcg Intravenous Once   gabapentin  100 mg Oral QHS   lactose free nutrition  237 mL Oral TID WC   levETIRAcetam  500 mg Oral BID   metoprolol tartrate  50 mg Oral TID   mouth rinse  15 mL Mouth Rinse 4 times per day   pantoprazole (PROTONIX) IV  40 mg Intravenous Q12H   potassium chloride  10 mEq Oral Daily   Continuous Infusions:  sodium chloride     amiodarone 30 mg/hr (09/24/22 1100)   cefTRIAXone (ROCEPHIN)  IV Stopped (09/23/22 1739)   PRN Meds:.diclofenac Sodium, mouth rinse, polyethylene glycol, senna      OBJECTIVE:         Vital signs in last 24 hours:    Temp:  [97.9 F (36.6 C)-99.7 F (37.6 C)] 98.2 F (36.8 C) (10/31 1130) Pulse Rate:  [61-133] 63 (10/31 1400) Resp:  [19-37] 26 (10/31 1400) BP: (92-130)/(45-84) 125/53 (10/31 1400) SpO2:  [93 %-100 %] 97 % (10/31 1400) FiO2 (%):  [30 %] 30 % (10/31 0834) Last BM Date : 09/23/22      Filed Weights    09/21/22 0500 09/22/22 0500 09/23/22 0343  Weight: 71.8 kg 72.2 kg 71.9 kg    General: resting in bed.  Did not  awaken and did not examine pt.     Heart: sinus rhythm in low 70s on tele.   Chest: no labored breathing.      Intake/Output from previous day: 10/30 0701 - 10/31 0700 In: 799.7 [P.O.:400; I.V.:299.7; IV Piggyback:100] Out: 650 [Urine:650]   Intake/Output this shift: Total I/O In: 162.1 [I.V.:162.1] Out: 200 [Urine:200]   Lab Results: Recent Labs (last 2 labs)        Recent Labs    09/23/22 1805 09/23/22 2300 09/24/22 0339  WBC 8.3 7.5 7.6  HGB 9.7* 9.0* 9.0*  HCT 27.4* 26.6* 26.9*  PLT 95* 104* 101*      BMET Recent Labs (last 2 labs)        Recent Labs    09/22/22 0207 09/23/22 0642 09/24/22 0339  NA 136 140 138  K 4.1 3.8 4.0  CL 94* 95* 96*  CO2 35* 36* 33*  GLUCOSE 118* 132* 137*  BUN 54* 42* 34*  CREATININE 0.80 0.67 0.72  CALCIUM 8.0* 8.5* 8.6*      LFT Recent Labs (  last 2 labs)       Recent Labs    09/22/22 0207 09/24/22 0339  PROT 5.6* 5.6*  ALBUMIN 2.4* 3.0*  AST 67* 71*  ALT 62* 66*  ALKPHOS 94 57  BILITOT 0.4 0.4      PT/INR Recent Labs (last 2 labs)   No results for input(s): "LABPROT", "INR" in the last 72 hours.   Hepatitis Panel Recent Labs (last 2 labs)   No results for input(s): "HEPBSAG", "HCVAB", "HEPAIGM", "HEPBIGM" in the last 72 hours.     Studies/Results:  Imaging Results (Last 48 hours)  ECHO TEE   Result Date: 09/24/2022    TRANSESOPHOGEAL ECHO REPORT   Patient Name:   TYARA DASSOW Date of Exam: 09/24/2022 Medical Rec #:  [id number]        Height:       60.0 in Accession #:    [id number]       Weight:       158.5 lb Date of Birth:  1930/01/17       BSA:          1.691 m Patient Age:    20 years         BP:           111/68 mmHg Patient Gender: F                HR:           118 bpm. Exam Location:  Inpatient Procedure: Transesophageal Echo, Cardiac Doppler and Color Doppler Indications:     atrial fibrillation  History:         Patient has prior history of Echocardiogram examinations, most                   recent 09/17/2022. CHF; Risk Factors:Hypertension, Dyslipidemia                  and Diabetes.  Sonographer:     Johny Chess RDCS Referring Phys:  [place] Diagnosing Phys: Dixie Dials MD PROCEDURE: After discussion of the risks and benefits of a TEE, an informed consent was obtained from the patient. The transesophogeal probe was passed without difficulty through the esophogus of the patient. Imaged were obtained with the patient in a left lateral decubitus position. Sedation performed by performing physician. Patients was under conscious sedation during this procedure., 2.5mg  of Versed. The patient developed no complications during the procedure. IMPRESSIONS  1. Left ventricular ejection fraction, by estimation, is 55 to 60%. The left ventricle has normal function. The left ventricle has no regional wall motion abnormalities. Left ventricular diastolic parameters are indeterminate.  2. Right ventricular systolic function is low normal. The right ventricular size is normal. Mildly increased right ventricular wall thickness.  3. Left atrial size was moderately dilated. No left atrial/left atrial appendage thrombus was detected.  4. Right atrial size was mildly dilated.  5. There is no evidence of cardiac tamponade.  6. The mitral valve is degenerative. Mild mitral valve regurgitation.  7. The aortic valve is tricuspid. There is mild calcification of the aortic valve. There is mild thickening of the aortic valve. Aortic valve regurgitation is mild.  8. Agitated saline contrast bubble study was negative, with no evidence of any interatrial shunt. FINDINGS  Left Ventricle: Left ventricular ejection fraction, by estimation, is 55 to 60%. The left ventricle has normal function. The left ventricle has no regional wall motion abnormalities. The left ventricular internal cavity size was normal  in size. There is  borderline concentric left ventricular hypertrophy. Left ventricular diastolic parameters are  indeterminate. Right Ventricle: The right ventricular size is normal. Mildly increased right ventricular wall thickness. Right ventricular systolic function is low normal. Left Atrium: Left atrial size was moderately dilated. No left atrial/left atrial appendage thrombus was detected. Right Atrium: Right atrial size was mildly dilated. Pericardium: Trivial pericardial effusion is present. The pericardial effusion is circumferential. There is no evidence of cardiac tamponade. Mitral Valve: The mitral valve is degenerative in appearance. Mild mitral valve regurgitation. There is no evidence of mitral valve vegetation. Tricuspid Valve: The tricuspid valve is normal in structure. Tricuspid valve regurgitation is mild. There is no evidence of tricuspid valve vegetation. Aortic Valve: The aortic valve is tricuspid. There is mild calcification of the aortic valve. There is mild thickening of the aortic valve. There is mild aortic valve annular calcification. Aortic valve regurgitation is mild. There is no evidence of aortic valve vegetation. Pulmonic Valve: The pulmonic valve was normal in structure. Pulmonic valve regurgitation is trivial. There is no evidence of pulmonic valve vegetation. Aorta: The aortic root is normal in size and structure. Venous: The left upper pulmonary vein, left lower pulmonary vein, right upper pulmonary vein and right lower pulmonary vein are normal. The inferior vena cava was not well visualized. IAS/Shunts: No atrial level shunt detected by color flow Doppler. Agitated saline contrast was given intravenously to evaluate for intracardiac shunting. Agitated saline contrast bubble study was negative, with no evidence of any interatrial shunt. Dixie Dials MD Electronically signed by Dixie Dials MD Signature Date/Time: 09/24/2022/9:54:04 AM    Final        ASSESMENT:      Maroon, dark bloody stool in setting of Eliquis, plavix over weekend.  Burgundy stools resolved.   [place] anemia.  After  initial drop Hgb from 14-15 down to 7 on 10/29, it has rebounded and is stable at 9.   Notes says previous colonoscopy within local region, no other details in chart.       Afib w RVR.  Cardioversion successful today 10/31.      Thrombocytopenia.  Improved.  INR 1.1.  no liver, spleen imaging.       PLAN      At this point the pt's son is not consenting to endoscopic procedures.  He wants pt out of ICU before considering this.  GI can not schedule for procedures until family provides consent.  Explained that EGD ought to be done while pt off AC, and medical team would like EGD before restart.  Son does not seen to fully understand this.  Will wait for pt's family to consent to EGD.  Dr Lyndel Safe will be best positioned to explain situation to family and will round on pt this afternoon.  I understand some of the grandkids are MDs, so involving them will be helpful.  .      Switch Protonix to 40 mg po daily.  Advance to full liquids if tolerated.  Currently quite sedated from procedure, anesthesia per her RN       Azucena Freed  09/24/2022, 2:17 PM Phone [phone]    Attending physician's note   I have taken history, reviewed the chart and examined the patient. I performed a substantive portion of this encounter, including complete performance of at least one of the key components, in conjunction with the APP. I agree with the Advanced Practitioner's note, impression and recommendations.    GI re-consulted for  EGD prior to starting [facility].  A Fib with RVR s/p successful cardioversion today 10/31  No active bleeding. Hb stable.  Family had refused endoscopic procedures previously. Niece Jeneen Montgomery [phone] who is a neurologist @ [employer] to make final call.  She is currently en-route to [facility] and not reachable.  Will reach back after getting in touch with her (likely tomorrow)   Switch Protonix to 40 mg p.o. daily   Carmell Austria, MD [facility] [phone]

## 2022-09-24 NOTE — Progress Notes (Signed)
OT Cancellation Note  Patient Details Name: [name] [name] MRN: [id number] DOB: Jun 21, 1930   Cancelled Treatment:    Reason Eval/Treat Not Completed: Fatigue/lethargy limiting ability to participate (patient sedated due to procedures on this date and patient's son asked for therapy to try again tomorrow.) Lodema Hong, [place]  Office [place] 09/24/2022, 2:30 PM

## 2022-09-24 NOTE — CV Procedure (Signed)
INDICATIONS:   The patient is 88 years old female with recent onset of atrial fibrillation.  PROCEDURE:  Informed consent was discussed including risks, benefits and alternatives for the procedure.  Risks include, but are not limited to, cough, sore throat, vomiting, nausea, somnolence, esophageal and stomach trauma or perforation, bleeding, low blood pressure, aspiration, pneumonia, infection, trauma to the teeth and death.    Patient was given sedation.  The oropharynx was anesthetized with topical lidocaine.  The transesophageal probe was inserted in the esophagus and stomach and multiple views were obtained.  Agitated saline was used after the transesophageal probe was removed from the body.  The patient was kept under observation until the patient left the procedure room.  The patient left the procedure room in stable condition.   COMPLICATIONS:  There were no immediate complications.  FINDINGS:  LEFT VENTRICLE: The left ventricle is normal in structure and function.  Wall motion is normal.  No thrombus or masses seen in the left ventricle.  RIGHT VENTRICLE:  The right ventricle is normal in structure and function without any thrombus or masses.    LEFT ATRIUM:  The left atrium is mildly dilated without any thrombus or masses.  LEFT ATRIAL APPENDAGE:  The left atrial appendage is free of any thrombus or masses.  RIGHT ATRIUM:  The right atrium is free of any thrombus or masses. Prominent eustachian valve seen   ATRIAL SEPTUM:  The atrial septum is normal without any ASD or PFO.  MITRAL VALVE:  The mitral valve is normal in structure and function with mild regurgitation, no masses, stenosis or vegetations.  TRICUSPID VALVE:  The tricuspid valve is normal in structure and function with mild regurgitation, no masses, stenosis or vegetations.  AORTIC VALVE:  The aortic valve is normal in structure and function with mild regurgitation, no masses, stenosis or vegetations.  PULMONIC VALVE:   The pulmonic valve is normal in structure and function with trivial regurgitation, no masses, stenosis or vegetations.  AORTIC ARCH, ASCENDING AND DESCENDING AORTA:  The aorta had moderate to sevbere atherosclerosis in the ascending or descending aorta.  The aortic arch was normal.   Superior Vena Cava : No thrombus or catheter.   Pulmonary Veins: Visible.   Pulmonary artery: visible and normal.   IMPRESSION:   Normal LV systolic function. Mild MR, TR, AI and trace PI Negative PFO.  RECOMMENDATIONS:   External cardioversion.

## 2022-09-24 NOTE — Consult Note (Signed)
Ref: Zola Button, MD   Subjective:  Awake.No additional GI bleed per nurse. VS stable post digoxin use. BP is low normal. She had 1 unit of PRBC transfusion for Hgb of 7.0  Objective:  Vital Signs in the last 24 hours: Temp:  [97.9 F (36.6 C)-99.7 F (37.6 C)] 98.2 F (36.8 C) (10/31 0721) Pulse Rate:  [71-133] 75 (10/31 0300) Cardiac Rhythm: Atrial fibrillation (10/31 0000) Resp:  [16-36] 20 (10/31 0300) BP: (83-134)/(34-110) 105/61 (10/31 0300) SpO2:  [93 %-100 %] 99 % (10/31 0300) FiO2 (%):  [30 %-40 %] 30 % (10/30 1950)  Physical Exam: BP Readings from Last 1 Encounters:  09/24/22 105/61     Wt Readings from Last 1 Encounters:  09/23/22 71.9 kg    Weight change:  Body mass index is 30.96 kg/m. HEENT: [place]/AT, Eyes-Brown, Conjunctiva-Pale pink, Sclera-Non-icteric Neck: No JVD, No bruit, Trachea midline. Lungs:  Clear, Bilateral. Cardiac:  Regular rhythm, normal S1 and S2, no S3. II/VI systolic murmur. Abdomen:  Soft, non-tender. BS present. Extremities:  No edema present. No cyanosis. No clubbing. CNS: AxOx3, Cranial nerves grossly intact, moves all 4 extremities.  Skin: Warm and dry.   Intake/Output from previous day: 10/30 0701 - 10/31 0700 In: 799.7 [P.O.:400; I.V.:299.7; IV Piggyback:100] Out: 650 [Urine:650]    Lab Results: BMET    Component Value Date/Time   NA 138 09/24/2022 0339   NA 140 09/23/2022 0642   NA 136 09/22/2022 0207   NA 141 06/14/2022 1505   NA 141 04/26/2020 1708   NA 141 06/22/2018 1448   K 4.0 09/24/2022 0339   K 3.8 09/23/2022 0642   K 4.1 09/22/2022 0207   CL 96 (L) 09/24/2022 0339   CL 95 (L) 09/23/2022 0642   CL 94 (L) 09/22/2022 0207   CO2 33 (H) 09/24/2022 0339   CO2 36 (H) 09/23/2022 0642   CO2 35 (H) 09/22/2022 0207   GLUCOSE 137 (H) 09/24/2022 0339   GLUCOSE 132 (H) 09/23/2022 0642   GLUCOSE 118 (H) 09/22/2022 0207   BUN 34 (H) 09/24/2022 0339   BUN 42 (H) 09/23/2022 0642   BUN 54 (H) 09/22/2022 0207   BUN 17  06/14/2022 1505   BUN 19 04/26/2020 1708   BUN 14 06/22/2018 1448   CREATININE 0.72 09/24/2022 0339   CREATININE 0.67 09/23/2022 0642   CREATININE 0.80 09/22/2022 0207   CALCIUM 8.6 (L) 09/24/2022 0339   CALCIUM 8.5 (L) 09/23/2022 0642   CALCIUM 8.0 (L) 09/22/2022 0207   GFRNONAA >60 09/24/2022 0339   GFRNONAA >60 09/23/2022 0642   GFRNONAA >60 09/22/2022 0207   GFRAA 69 04/26/2020 1708   GFRAA >60 06/15/2019 0841   GFRAA >60 11/13/2018 1941   CBC    Component Value Date/Time   WBC 7.5 09/23/2022 2300   RBC 2.87 (L) 09/23/2022 2300   HGB 9.0 (L) 09/23/2022 2300   HGB 15.2 04/26/2020 1708   HCT 26.6 (L) 09/23/2022 2300   HCT 46.5 04/26/2020 1708   PLT 104 (L) 09/23/2022 2300   PLT 102 (L) 04/26/2020 1708   MCV 92.7 09/23/2022 2300   MCV 94 04/26/2020 1708   MCH 31.4 09/23/2022 2300   MCHC 33.8 09/23/2022 2300   RDW 15.5 09/23/2022 2300   RDW 12.8 04/26/2020 1708   LYMPHSABS 1.1 09/17/2022 0207   LYMPHSABS 1.6 04/26/2020 1708   MONOABS 0.8 09/17/2022 0207   EOSABS 0.0 09/17/2022 0207   EOSABS 0.0 04/26/2020 1708   BASOSABS 0.1 09/17/2022 0207  BASOSABS 0.0 04/26/2020 1708   HEPATIC Function Panel Recent Labs    09/21/22 0253 09/22/22 0207 09/24/22 0339  PROT 6.2* 5.6* 5.6*  ALBUMIN 2.6* 2.4* 3.0*  AST 41 67* 71*  ALT 51* 62* 66*  ALKPHOS 105 94 57   HEMOGLOBIN A1C Lab Results  Component Value Date   MPG 126 (H) 07/18/2014   CARDIAC ENZYMES Lab Results  Component Value Date   TROPONINI <0.30 03/24/2012   BNP No results for input(s): "PROBNP" in the last 8760 hours. TSH Recent Labs    09/16/22 1929  TSH 1.792   CHOLESTEROL Recent Labs    06/14/22 1505 09/17/22 0207  CHOL 180 161    Scheduled Meds:  atorvastatin  20 mg Oral QPM   Chlorhexidine Gluconate Cloth  6 each Topical Daily   cycloSPORINE  1 drop Both Eyes BID   digoxin  0.125 mg Intravenous Daily   diltiazem  60 mg Oral Q8H   DULoxetine  20 mg Oral Daily   gabapentin  100 mg  Oral QHS   lactose free nutrition  237 mL Oral TID WC   levETIRAcetam  500 mg Oral BID   metoprolol tartrate  50 mg Oral TID   mouth rinse  15 mL Mouth Rinse 4 times per day   pantoprazole (PROTONIX) IV  40 mg Intravenous Q12H   potassium chloride  10 mEq Oral Daily   Continuous Infusions:  sodium chloride     amiodarone 30 mg/hr (09/24/22 0429)   cefTRIAXone (ROCEPHIN)  IV Stopped (09/23/22 1739)   PRN Meds:.diclofenac Sodium, mouth rinse, polyethylene glycol, senna  Assessment/Plan: Acute GI bleed Atrial fibrillation with moderate ventricular response HTN HLD Obesity Old stroke Arthritis Parkinson's disease  Possible TEE with cardioversion tomorrow.   LOS: 7 days   Time spent including chart review, lab review, examination, discussion with patient/Family : 30 min   Dixie Dials  MD  09/24/2022, 7:22 AM

## 2022-09-24 NOTE — Procedures (Addendum)
Moderate Conscious Sedation Note    [name] [name]  [id number]  10-22-1930   Date:09/24/22  Time:9:21 AM   Provider Performing:Maeson Lourenco B Samona Chihuahua   Procedure: Moderate Conscious Sedation  During this procedure the patient is administered a total of Versed 2.5 mg and propofol 40mg  in total to achieve and maintain moderate conscious sedation. The patient's heart rate, blood pressure, and oxygen saturation are monitored continuously during the procedure. The period of conscious sedation is 30 minutes, of which I was present face-to-face 100% of this time.   Freda Jackson, MD [facility] Office: [phone]   See Amion for personal pager PCCM on call pager [phone] until 7pm. Please call Elink 7p-7a. [phone]

## 2022-09-24 NOTE — CV Procedure (Signed)
PRE-OP DIAGNOSIS:  Atrial fibrillation.  POST-OP DIAGNOSIS:  Successful cardioversion to sinus rhythm.  OPERATOR:  Dixie Dials, MD.     ANESTHESIA:  50 mg of propofol and 2.5 mcg of versed.  COMPLICATIONS:  None.   OPERATIVE TERM:  DC cardioversion.  The nature of the procedure, risks and alternatives were discussed with the patient who gave informed consent.  OPERATIVE TECHNIQUE:  The patient was sedated with 50 mg of propofol and 2.5 mcg of Versed.  When the patient was no longer responsive to quiet voice, DC cardioversion was performed with 120 J biphasically and synchronously.  She converted to sinus rhythm. The patient was then monitored until fully alert and left the procedure area in stable condition.  IMPRESSION:  Successful DC cardioversion.

## 2022-09-24 NOTE — Progress Notes (Signed)
Regarding family consent to proceed w/ endoscopy: all family are in consensus that they wish to proceed with the procedure ASAP, while she is in ICU. They were very comfortable with the administration of conscious sedation for the TEE/DCCV this morning and are hopeful that Dr. Erin Fulling will be available to manage sedation in conjunction with GI for endo if the px is deemed the best coarse of action to make the best possible decision r/t [place].

## 2022-09-24 NOTE — Progress Notes (Signed)
  Echocardiogram Echocardiogram Transesophageal has been performed.  [name] [name] 09/24/2022, 9:18 AM

## 2022-09-24 NOTE — Consult Note (Signed)
Ref: Zola Button, MD   Subjective:  Awake. VS stable. HR in 80' to 90's. Atrial fibrillation continues. Hgb 9.0 gm.  Objective:  Vital Signs in the last 24 hours: Temp:  [97.9 F (36.6 C)-99.7 F (37.6 C)] 98.2 F (36.8 C) (10/31 0721) Pulse Rate:  [71-133] 75 (10/31 0300) Cardiac Rhythm: Atrial fibrillation (10/31 0000) Resp:  [16-36] 20 (10/31 0300) BP: (83-134)/(34-110) 105/61 (10/31 0300) SpO2:  [93 %-100 %] 99 % (10/31 0300) FiO2 (%):  [30 %-40 %] 30 % (10/30 1950)  Physical Exam: BP Readings from Last 1 Encounters:  09/24/22 105/61     Wt Readings from Last 1 Encounters:  09/23/22 71.9 kg    Weight change:  Body mass index is 30.96 kg/m. HEENT: [place]/AT, Eyes-Brown, Conjunctiva-Pale pink, Sclera-Non-icteric Neck: No JVD, No bruit, Trachea midline. Lungs:  Clear, Bilateral. Cardiac:  Irregular rhythm, normal S1 and S2, no S3. II/VI systolic murmur. Abdomen:  Soft, non-tender. BS present. Extremities:  No edema present. No cyanosis. No clubbing. CNS: AxOx3, Cranial nerves grossly intact, moves all 4 extremities.  Skin: Warm and dry.   Intake/Output from previous day: 10/30 0701 - 10/31 0700 In: 799.7 [P.O.:400; I.V.:299.7; IV Piggyback:100] Out: 650 [Urine:650]    Lab Results: BMET    Component Value Date/Time   NA 138 09/24/2022 0339   NA 140 09/23/2022 0642   NA 136 09/22/2022 0207   NA 141 06/14/2022 1505   NA 141 04/26/2020 1708   NA 141 06/22/2018 1448   K 4.0 09/24/2022 0339   K 3.8 09/23/2022 0642   K 4.1 09/22/2022 0207   CL 96 (L) 09/24/2022 0339   CL 95 (L) 09/23/2022 0642   CL 94 (L) 09/22/2022 0207   CO2 33 (H) 09/24/2022 0339   CO2 36 (H) 09/23/2022 0642   CO2 35 (H) 09/22/2022 0207   GLUCOSE 137 (H) 09/24/2022 0339   GLUCOSE 132 (H) 09/23/2022 0642   GLUCOSE 118 (H) 09/22/2022 0207   BUN 34 (H) 09/24/2022 0339   BUN 42 (H) 09/23/2022 0642   BUN 54 (H) 09/22/2022 0207   BUN 17 06/14/2022 1505   BUN 19 04/26/2020 1708   BUN 14  06/22/2018 1448   CREATININE 0.72 09/24/2022 0339   CREATININE 0.67 09/23/2022 0642   CREATININE 0.80 09/22/2022 0207   CALCIUM 8.6 (L) 09/24/2022 0339   CALCIUM 8.5 (L) 09/23/2022 0642   CALCIUM 8.0 (L) 09/22/2022 0207   GFRNONAA >60 09/24/2022 0339   GFRNONAA >60 09/23/2022 0642   GFRNONAA >60 09/22/2022 0207   GFRAA 69 04/26/2020 1708   GFRAA >60 06/15/2019 0841   GFRAA >60 11/13/2018 1941   CBC    Component Value Date/Time   WBC 7.5 09/23/2022 2300   RBC 2.87 (L) 09/23/2022 2300   HGB 9.0 (L) 09/23/2022 2300   HGB 15.2 04/26/2020 1708   HCT 26.6 (L) 09/23/2022 2300   HCT 46.5 04/26/2020 1708   PLT 104 (L) 09/23/2022 2300   PLT 102 (L) 04/26/2020 1708   MCV 92.7 09/23/2022 2300   MCV 94 04/26/2020 1708   MCH 31.4 09/23/2022 2300   MCHC 33.8 09/23/2022 2300   RDW 15.5 09/23/2022 2300   RDW 12.8 04/26/2020 1708   LYMPHSABS 1.1 09/17/2022 0207   LYMPHSABS 1.6 04/26/2020 1708   MONOABS 0.8 09/17/2022 0207   EOSABS 0.0 09/17/2022 0207   EOSABS 0.0 04/26/2020 1708   BASOSABS 0.1 09/17/2022 0207   BASOSABS 0.0 04/26/2020 1708   HEPATIC Function Panel Recent Labs  09/21/22 0253 09/22/22 0207 09/24/22 0339  PROT 6.2* 5.6* 5.6*  ALBUMIN 2.6* 2.4* 3.0*  AST 41 67* 71*  ALT 51* 62* 66*  ALKPHOS 105 94 57   HEMOGLOBIN A1C Lab Results  Component Value Date   MPG 126 (H) 07/18/2014   CARDIAC ENZYMES Lab Results  Component Value Date   TROPONINI <0.30 03/24/2012   BNP No results for input(s): "PROBNP" in the last 8760 hours. TSH Recent Labs    09/16/22 1929  TSH 1.792   CHOLESTEROL Recent Labs    06/14/22 1505 09/17/22 0207  CHOL 180 161    Scheduled Meds:  atorvastatin  20 mg Oral QPM   Chlorhexidine Gluconate Cloth  6 each Topical Daily   cycloSPORINE  1 drop Both Eyes BID   digoxin  0.125 mg Intravenous Daily   diltiazem  60 mg Oral Q8H   DULoxetine  20 mg Oral Daily   gabapentin  100 mg Oral QHS   lactose free nutrition  237 mL Oral TID WC    levETIRAcetam  500 mg Oral BID   metoprolol tartrate  50 mg Oral TID   mouth rinse  15 mL Mouth Rinse 4 times per day   pantoprazole (PROTONIX) IV  40 mg Intravenous Q12H   potassium chloride  10 mEq Oral Daily   Continuous Infusions:  sodium chloride     amiodarone 30 mg/hr (09/24/22 0429)   cefTRIAXone (ROCEPHIN)  IV Stopped (09/23/22 1739)   PRN Meds:.diclofenac Sodium, mouth rinse, polyethylene glycol, senna  Assessment/Plan: Atrial fibrillation Recent GI bleed HTN HLD Obesity Old stroke Arthritis Parkinson's disease  Plan: Awaiting TEE today.   LOS: 7 days   Time spent including chart review, lab review, examination, discussion with patient/Family : 30 min   Dixie Dials  MD  09/24/2022, 7:28 AM

## 2022-09-24 NOTE — TOC Progression Note (Signed)
Transition of Care [facility]) - Progression Note    Patient Details  Name: AGAM TUOHY MRN: [id number] Date of Birth: [birth year]  Transition of Care [facility] CM/SW Contact  Joanne Chars, LCSW Phone Number: 09/24/2022, 2:17 PM  Clinical Narrative:  CSW asked to come meet with family, spoke with them in room.  They have been in touch with [name] and would like to pursue.  Referral had already been declined by [name], resent, confirmed with Whitney[name] that she did receive and is reconsidering potential bed offers.       Expected Discharge Plan: [place] Barriers to Discharge: Continued Medical Work up  Expected Discharge Plan and Services Expected Discharge Plan: [place] arrangements for the past 2 months: Single Family Home                                       Social Determinants of Health (SDOH) Interventions    Readmission Risk Interventions     No data to display

## 2022-09-25 ENCOUNTER — Inpatient Hospital Stay (HOSPITAL_COMMUNITY): Payer: Medicare Other

## 2022-09-25 DIAGNOSIS — I4891 Unspecified atrial fibrillation: Secondary | ICD-10-CM | POA: Diagnosis not present

## 2022-09-25 LAB — COMPREHENSIVE METABOLIC PANEL
ALT: 134 U/L — ABNORMAL HIGH (ref 0–44)
AST: 158 U/L — ABNORMAL HIGH (ref 15–41)
Albumin: 2.9 g/dL — ABNORMAL LOW (ref 3.5–5.0)
Alkaline Phosphatase: 68 U/L (ref 38–126)
Anion gap: 8 (ref 5–15)
BUN: 26 mg/dL — ABNORMAL HIGH (ref 8–23)
CO2: 33 mmol/L — ABNORMAL HIGH (ref 22–32)
Calcium: 8.6 mg/dL — ABNORMAL LOW (ref 8.9–10.3)
Chloride: 98 mmol/L (ref 98–111)
Creatinine, Ser: 0.69 mg/dL (ref 0.44–1.00)
GFR, Estimated: >60 mL/min (ref >60)
Glucose, Bld: 127 mg/dL — ABNORMAL HIGH (ref 70–99)
Potassium: 3.9 mmol/L (ref 3.5–5.1)
Sodium: 139 mmol/L (ref 135–145)
Total Bilirubin: 0.4 mg/dL (ref 0.3–1.2)
Total Protein: 5.3 g/dL — ABNORMAL LOW (ref 6.5–8.1)

## 2022-09-25 LAB — CBC
HCT: 27.1 % — ABNORMAL LOW (ref 36.0–46.0)
Hemoglobin: 8.6 g/dL — ABNORMAL LOW (ref 12.0–15.0)
MCH: 30.4 pg (ref 26.0–34.0)
MCHC: 31.7 g/dL (ref 30.0–36.0)
MCV: 95.8 fL (ref 80.0–100.0)
Platelets: 106 10*3/uL — ABNORMAL LOW (ref 150–400)
RBC: 2.83 MIL/uL — ABNORMAL LOW (ref 3.87–5.11)
RDW: 15.2 % (ref 11.5–15.5)
WBC: 7.4 10*3/uL (ref 4.0–10.5)
nRBC: 0.3 % — ABNORMAL HIGH (ref 0.0–0.2)

## 2022-09-25 LAB — TRIGLYCERIDES: Triglycerides: 48 mg/dL (ref <150)

## 2022-09-25 LAB — MAGNESIUM: Magnesium: 2.1 mg/dL (ref 1.7–2.4)

## 2022-09-25 MED ORDER — AMIODARONE HCL 200 MG PO TABS
200.0000 mg | ORAL_TABLET | Freq: Every day | ORAL | Status: DC
  Administered 2022-09-30 – 2022-10-04 (×5): 200 mg via ORAL
  Filled 2022-09-25 (×5): qty 1

## 2022-09-25 MED ORDER — TORSEMIDE 20 MG PO TABS
20.0000 mg | ORAL_TABLET | Freq: Every day | ORAL | Status: DC
  Administered 2022-09-25 – 2022-10-04 (×10): 20 mg via ORAL
  Filled 2022-09-25 (×10): qty 1

## 2022-09-25 MED ORDER — AMIODARONE HCL 200 MG PO TABS
200.0000 mg | ORAL_TABLET | Freq: Two times a day (BID) | ORAL | Status: AC
  Administered 2022-09-25 – 2022-09-29 (×10): 200 mg via ORAL
  Filled 2022-09-25 (×10): qty 1

## 2022-09-25 MED ORDER — DILTIAZEM HCL 60 MG PO TABS
30.0000 mg | ORAL_TABLET | Freq: Four times a day (QID) | ORAL | Status: DC
  Administered 2022-09-25 – 2022-09-28 (×13): 30 mg via ORAL
  Filled 2022-09-25 (×13): qty 1

## 2022-09-25 MED ORDER — AMIODARONE HCL 200 MG PO TABS: 200.0000 mg | ORAL_TABLET | Freq: Two times a day (BID) | ORAL | Status: DC

## 2022-09-25 MED ORDER — METOPROLOL TARTRATE 25 MG PO TABS: 50.0000 mg | ORAL_TABLET | Freq: Two times a day (BID) | ORAL | Status: DC

## 2022-09-25 MED ORDER — METOPROLOL TARTRATE 25 MG PO TABS
25.0000 mg | ORAL_TABLET | Freq: Four times a day (QID) | ORAL | Status: DC
  Administered 2022-09-25 – 2022-09-27 (×6): 25 mg via ORAL
  Filled 2022-09-25 (×8): qty 1

## 2022-09-25 MED ORDER — SODIUM CHLORIDE 0.9 % IV SOLN: INTRAVENOUS | Status: DC

## 2022-09-25 MED ORDER — GUAIFENESIN 100 MG/5ML PO LIQD: 5.0000 mL | ORAL | Status: DC | PRN

## 2022-09-25 MED ORDER — SIMETHICONE 40 MG/0.6ML PO SUSP
40.0000 mg | Freq: Four times a day (QID) | ORAL | Status: DC | PRN
  Administered 2022-09-29 – 2022-10-02 (×3): 40 mg via ORAL
  Filled 2022-09-25 (×4): qty 0.6

## 2022-09-25 NOTE — Progress Notes (Signed)
eLink Physician-Brief Progress Note Patient Name: [name] [name] DOB: 25-Sep-1930 MRN: [id number]   Date of Service  09/25/2022  HPI/Events of Note  HR is in the 50's with BP = 108/40. Currently on an Amiodarone IV infusion, Digoxin PO, Cardizem PO and Metoprolol PO.   eICU Interventions  Plan: Hold Amiodarone IV infusion until HR > 60 X 2 hours then restart.  Will put hold dose for HR < 60 on Digoxin, Cardizem and Metoprolol.      Intervention Category Major Interventions: Other:  Lysle Dingwall 09/25/2022, 2:33 AM

## 2022-09-25 NOTE — Progress Notes (Signed)
Patient transitioned off [place] to a [place] 8L with humidity. Patient states, through her daughter, that her breathing feels good. RT will continue to monitor.

## 2022-09-25 NOTE — H&P (View-Only) (Signed)
Daily Rounding Note  09/25/2022, 12:21 PM  LOS: 8 days   SUBJECTIVE:   Chief complaint: resolved burgundy stool.     Note some HR readings to 50s early this AM.  CCM call MD held Amiodarone x 3 hours.  Initiated hold dose for HR < 60 on Digoxin, Cardizem and Metoprolol.  OBJECTIVE:         Vital signs in last 24 hours:    Temp:  [97.8 F (36.6 C)-98.6 F (37 C)] 97.8 F (36.6 C) (11/01 1143) Pulse Rate:  [54-85] 64 (11/01 1100) Resp:  [18-36] 20 (11/01 1100) BP: (94-131)/(30-82) 110/47 (11/01 1100) SpO2:  [91 %-100 %] 100 % (11/01 1100) FiO2 (%):  [30 %] 30 % (11/01 0720) Last BM Date : 09/23/22 Filed Weights   09/21/22 0500 09/22/22 0500 09/23/22 0343  Weight: 71.8 kg 72.2 kg 71.9 kg   Pt not re-examined.    Intake/Output from previous day: 10/31 0701 - 11/01 0700 In: 533.5 [I.V.:433.5; IV Piggyback:100] Out: 950 [Urine:950]  Intake/Output this shift: Total I/O In: 66.7 [I.V.:66.7] Out: -   Lab Results: Recent Labs    09/23/22 2300 09/24/22 0339 09/25/22 0535  WBC 7.5 7.6 7.4  HGB 9.0* 9.0* 8.6*  HCT 26.6* 26.9* 27.1*  PLT 104* 101* 106*   BMET Recent Labs    09/23/22 0642 09/24/22 0339 09/25/22 0535  NA 140 138 139  K 3.8 4.0 3.9  CL 95* 96* 98  CO2 36* 33* 33*  GLUCOSE 132* 137* 127*  BUN 42* 34* 26*  CREATININE 0.67 0.72 0.69  CALCIUM 8.5* 8.6* 8.6*   LFT Recent Labs    09/24/22 0339 09/25/22 0535  PROT 5.6* 5.3*  ALBUMIN 3.0* 2.9*  AST 71* 158*  ALT 66* 134*  ALKPHOS 57 68  BILITOT 0.4 0.4   PT/INR No results for input(s): "LABPROT", "INR" in the last 72 hours. Hepatitis Panel No results for input(s): "HEPBSAG", "HCVAB", "HEPAIGM", "HEPBIGM" in the last 72 hours.  Studies/Results: DG CHEST PORT 1 VIEW  Result Date: 09/25/2022 CLINICAL DATA:  Dyspnea, weakness, history stroke, diabetes mellitus, hypertension EXAM: PORTABLE CHEST 1 VIEW COMPARISON:  Portable exam 0438  hours compared to 09/22/2022 FINDINGS: Enlargement of cardiac silhouette with pulmonary vascular congestion. Atherosclerotic calcification aorta. Bibasilar effusions and atelectasis. Upper lungs clear. No pneumothorax or acute osseous findings. IMPRESSION: Enlargement of cardiac silhouette with persistent bibasilar pleural effusions and atelectasis. Aortic Atherosclerosis (ICD10-I70.0). Electronically Signed   By: Lavonia Dana M.D.   On: 09/25/2022 08:14   ECHO TEE  Result Date: 09/24/2022  IMPRESSIONS  1. Left ventricular ejection fraction, by estimation, is 55 to 60%. The left ventricle has normal function. The left ventricle has no regional wall motion abnormalities. Left ventricular diastolic parameters are indeterminate.  2. Right ventricular systolic function is low normal. The right ventricular size is normal. Mildly increased right ventricular wall thickness.  3. Left atrial size was moderately dilated. No left atrial/left atrial appendage thrombus was detected.  4. Right atrial size was mildly dilated.  5. There is no evidence of cardiac tamponade.  6. The mitral valve is degenerative. Mild mitral valve regurgitation.  7. The aortic valve is tricuspid. There is mild calcification of the aortic valve. There is mild thickening of the aortic valve. Aortic valve regurgitation is mild.  8. Agitated saline contrast bubble study was negative, with no evidence of any interatrial shunt. Electronically signed by Dixie Dials MD Signature Date/Time: 09/24/2022/9:54:04 AM  Final     Scheduled Meds:  amiodarone  200 mg Oral BID   Followed by   Derrill Memo ON 09/30/2022] amiodarone  200 mg Oral Daily   atorvastatin  20 mg Oral QPM   Chlorhexidine Gluconate Cloth  6 each Topical Daily   cycloSPORINE  1 drop Both Eyes BID   digoxin  0.0625 mg Oral Daily   diltiazem  30 mg Oral QID   DULoxetine  20 mg Oral Daily   fentaNYL (SUBLIMAZE) injection  100 mcg Intravenous Once   gabapentin  100 mg Oral QHS   lactose  free nutrition  237 mL Oral TID WC   levETIRAcetam  500 mg Oral BID   metoprolol tartrate  25 mg Oral QID   mouth rinse  15 mL Mouth Rinse 4 times per day   pantoprazole  40 mg Oral Q0600   potassium chloride  10 mEq Oral Daily   torsemide  20 mg Oral Daily   Continuous Infusions:  sodium chloride     PRN Meds:.diclofenac Sodium, guaiFENesin, mouth rinse, polyethylene glycol, senna, simethicone   ASSESMENT:     Resolved burgundy bloody stools a few days ago     A-fib, RVR.  Successful cardioversion 10/31.    Progressive elevation LFTs, likely due to amiodarone.  HCV Ab and HBV surface Ag were negative in 2015.    PLAN     EGD tomorrow 12:30.    As there is no record of prior liver imaging, ordered an ultrasound for tomorrow morning as she will be n.p.o. for the EGD    Azucena Freed  09/25/2022, 12:21 PM Phone [phone]     Attending physician's note   I have taken history, reviewed the chart and examined the patient. I performed a substantive portion of this encounter, including complete performance of at least one of the key components, in conjunction with the APP. I agree with the Advanced Practitioner's note, impression and recommendations.   No further bleeding. Hb stable Discussed with [name] and family- EGD tomorrow. No colon Noted increase in LFTs- ?d/t amiodarone. Will also get US liver in AM. Trend LFTs.  D/W Dr Erin Fulling.  Appreciate his help with CS/propofol tomorrow.  Carmell Austria, MD [facility] [phone]

## 2022-09-25 NOTE — Progress Notes (Signed)
Occupational Therapy Treatment Patient Details Name: [name] [name] MRN: [id number] DOB: 05-Aug-1930 Today's Date: 09/25/2022   History of present illness Pt is an 88 y.o. female admitted 09/16/22 with worsening BLE weakness, SOB. Pt with new onset AFib with RVR as well as new CHF and GIB. Bilateral pleural effusions R>L found 10/29. S/P TEE/DCCV 10/31.  PMH includes HTN, DVT, DM, essential tremor, stroke (2015), anxiety.   OT comments  Patient supine in bed and agreeable to OT session, son at side and assisting with translation as needed.  Pt with significant full body tremors throughout session, able to follow simple commands with increased time but limited by weakness and decreased activity tolerance. She fatigues quickly, noted only on 8L HFNC today with SPO2 maintained but poor tolerance. Requires up to max assist to sit EOB with heavy posterior lean preference, min assist to wash face.  Downgrading goals, but continue to recommend SNF at this time.  Will follow acutely.    Recommendations for follow up therapy are one component of a multi-disciplinary discharge planning process, led by the attending physician.  Recommendations may be updated based on patient status, additional functional criteria and insurance authorization.    Follow Up Recommendations  Skilled nursing-short term rehab (<3 hours/day)    Assistance Recommended at Discharge Frequent or constant Supervision/Assistance  Patient can return home with the following  Assistance with cooking/housework;Direct supervision/assist for financial management;Assistance with feeding;Direct supervision/assist for medications management;Assist for transportation;Help with stairs or ramp for entrance;Two people to help with walking and/or transfers;Two people to help with bathing/dressing/bathroom   Equipment Recommendations  Other (comment) (defer)    Recommendations for Other Services      Precautions / Restrictions  Precautions Precautions: Fall;Other (comment) Precaution Comments: Watch HR and O2 Restrictions Weight Bearing Restrictions: No       Mobility Bed Mobility Overal bed mobility: Needs Assistance Bed Mobility: Supine to Sit, Sit to Supine     Supine to sit: Max assist, +2 for physical assistance, HOB elevated Sit to supine: Total assist, +2 for physical assistance   General bed mobility comments: pt able to initate BLEs towards EOB with increased time, support required for trunk and scooting    Transfers                   General transfer comment: deferred     Balance Overall balance assessment: Needs assistance Sitting-balance support: Single extremity supported, Bilateral upper extremity supported, Feet unsupported Sitting balance-Leahy Scale: Poor Sitting balance - Comments: mod-maxA for majority of session Postural control: Left lateral lean, Posterior lean                                 ADL either performed or assessed with clinical judgement   ADL Overall ADL's : Needs assistance/impaired     Grooming: Moderate assistance;Sitting Grooming Details (indicate cue type and reason): pt able to wash face at EOB, but would required increased assist for bimanual tasks. assist required for balance if unsupported.         Upper Body Dressing : Moderate assistance;Sitting   Lower Body Dressing: Total assistance;Sitting/lateral leans;Bed level     Toilet Transfer Details (indicate cue type and reason): unable         Functional mobility during ADLs: Maximal assistance;Total assistance;+2 for physical assistance;+2 for safety/equipment General ADL Comments: limited to EOB only    Extremity/Trunk Assessment  Vision       Perception     Praxis      Cognition Arousal/Alertness: Awake/alert Behavior During Therapy: Flat affect Overall Cognitive Status: Difficult to assess Area of Impairment: Following commands                        Following Commands: Follows one step commands with increased time       General Comments: pt communicates appropriately via very basic english, son interprets for more complex questions/thoughts        Exercises      Shoulder Instructions       General Comments pt on 8L HFNC, significant whole body tremors today. Encouraged UE hand pumps, shoulder flexion and elevation of UEs.    Pertinent Vitals/ Pain       Pain Assessment Pain Assessment: No/denies pain  Home Living                                          Prior Functioning/Environment              Frequency  Min 2X/week        Progress Toward Goals  OT Goals(current goals can now be found in the care plan section)  Progress towards OT goals: Goals drowngraded-see care plan  Acute Rehab OT Goals Patient Stated Goal: rehab OT Goal Formulation: With patient/family Time For Goal Achievement: 10/09/22 Potential to Achieve Goals: Good  Plan Discharge plan remains appropriate;Frequency remains appropriate    Co-evaluation    PT/OT/SLP Co-Evaluation/Treatment: Yes Reason for Co-Treatment: Complexity of the patient's impairments (multi-system involvement);For patient/therapist safety;To address functional/ADL transfers PT goals addressed during session: Mobility/safety with mobility;Balance;Strengthening/ROM OT goals addressed during session: ADL's and self-care      AM-PAC OT "6 Clicks" Daily Activity     Outcome Measure   Help from another person eating meals?: Total Help from another person taking care of personal grooming?: A Lot Help from another person toileting, which includes using toliet, bedpan, or urinal?: Total Help from another person bathing (including washing, rinsing, drying)?: A Lot Help from another person to put on and taking off regular upper body clothing?: A Lot Help from another person to put on and taking off regular lower body clothing?:  Total 6 Click Score: 9    End of Session    OT Visit Diagnosis: Unsteadiness on feet (R26.81);Other abnormalities of gait and mobility (R26.89);Muscle weakness (generalized) (M62.81);Pain   Activity Tolerance Patient tolerated treatment well   Patient Left in bed;with call bell/phone within reach;with bed alarm set;with family/visitor present   Nurse Communication Mobility status        Time: 0263-7858 OT Time Calculation (min): 31 min  Charges: OT General Charges $OT Visit: 1 Visit OT Treatments $Self Care/Home Management : 8-22 mins  Jolaine Artist, [place] Office [phone]   Delight Stare 09/25/2022, 1:39 PM

## 2022-09-25 NOTE — Progress Notes (Addendum)
Daily Rounding Note  09/25/2022, 12:21 PM  LOS: 8 days   SUBJECTIVE:   Chief complaint: resolved burgundy stool.     Note some HR readings to 50s early this AM.  CCM call MD held Amiodarone x 3 hours.  Initiated hold dose for HR < 60 on Digoxin, Cardizem and Metoprolol.  OBJECTIVE:         Vital signs in last 24 hours:    Temp:  [97.8 F (36.6 C)-98.6 F (37 C)] 97.8 F (36.6 C) (11/01 1143) Pulse Rate:  [54-85] 64 (11/01 1100) Resp:  [18-36] 20 (11/01 1100) BP: (94-131)/(30-82) 110/47 (11/01 1100) SpO2:  [91 %-100 %] 100 % (11/01 1100) FiO2 (%):  [30 %] 30 % (11/01 0720) Last BM Date : 09/23/22 Filed Weights   09/21/22 0500 09/22/22 0500 09/23/22 0343  Weight: 71.8 kg 72.2 kg 71.9 kg   Pt not re-examined.    Intake/Output from previous day: 10/31 0701 - 11/01 0700 In: 533.5 [I.V.:433.5; IV Piggyback:100] Out: 950 [Urine:950]  Intake/Output this shift: Total I/O In: 66.7 [I.V.:66.7] Out: -   Lab Results: Recent Labs    09/23/22 2300 09/24/22 0339 09/25/22 0535  WBC 7.5 7.6 7.4  HGB 9.0* 9.0* 8.6*  HCT 26.6* 26.9* 27.1*  PLT 104* 101* 106*   BMET Recent Labs    09/23/22 0642 09/24/22 0339 09/25/22 0535  NA 140 138 139  K 3.8 4.0 3.9  CL 95* 96* 98  CO2 36* 33* 33*  GLUCOSE 132* 137* 127*  BUN 42* 34* 26*  CREATININE 0.67 0.72 0.69  CALCIUM 8.5* 8.6* 8.6*   LFT Recent Labs    09/24/22 0339 09/25/22 0535  PROT 5.6* 5.3*  ALBUMIN 3.0* 2.9*  AST 71* 158*  ALT 66* 134*  ALKPHOS 57 68  BILITOT 0.4 0.4   PT/INR No results for input(s): "LABPROT", "INR" in the last 72 hours. Hepatitis Panel No results for input(s): "HEPBSAG", "HCVAB", "HEPAIGM", "HEPBIGM" in the last 72 hours.  Studies/Results: DG CHEST PORT 1 VIEW  Result Date: 09/25/2022 CLINICAL DATA:  Dyspnea, weakness, history stroke, diabetes mellitus, hypertension EXAM: PORTABLE CHEST 1 VIEW COMPARISON:  Portable exam 0438  hours compared to 09/22/2022 FINDINGS: Enlargement of cardiac silhouette with pulmonary vascular congestion. Atherosclerotic calcification aorta. Bibasilar effusions and atelectasis. Upper lungs clear. No pneumothorax or acute osseous findings. IMPRESSION: Enlargement of cardiac silhouette with persistent bibasilar pleural effusions and atelectasis. Aortic Atherosclerosis (ICD10-I70.0). Electronically Signed   By: Lavonia Dana M.D.   On: 09/25/2022 08:14   ECHO TEE  Result Date: 09/24/2022  IMPRESSIONS  1. Left ventricular ejection fraction, by estimation, is 55 to 60%. The left ventricle has normal function. The left ventricle has no regional wall motion abnormalities. Left ventricular diastolic parameters are indeterminate.  2. Right ventricular systolic function is low normal. The right ventricular size is normal. Mildly increased right ventricular wall thickness.  3. Left atrial size was moderately dilated. No left atrial/left atrial appendage thrombus was detected.  4. Right atrial size was mildly dilated.  5. There is no evidence of cardiac tamponade.  6. The mitral valve is degenerative. Mild mitral valve regurgitation.  7. The aortic valve is tricuspid. There is mild calcification of the aortic valve. There is mild thickening of the aortic valve. Aortic valve regurgitation is mild.  8. Agitated saline contrast bubble study was negative, with no evidence of any interatrial shunt. Electronically signed by Dixie Dials MD Signature Date/Time: 09/24/2022/9:54:04 AM  Final     Scheduled Meds:  amiodarone  200 mg Oral BID   Followed by   Derrill Memo ON 09/30/2022] amiodarone  200 mg Oral Daily   atorvastatin  20 mg Oral QPM   Chlorhexidine Gluconate Cloth  6 each Topical Daily   cycloSPORINE  1 drop Both Eyes BID   digoxin  0.0625 mg Oral Daily   diltiazem  30 mg Oral QID   DULoxetine  20 mg Oral Daily   fentaNYL (SUBLIMAZE) injection  100 mcg Intravenous Once   gabapentin  100 mg Oral QHS   lactose  free nutrition  237 mL Oral TID WC   levETIRAcetam  500 mg Oral BID   metoprolol tartrate  25 mg Oral QID   mouth rinse  15 mL Mouth Rinse 4 times per day   pantoprazole  40 mg Oral Q0600   potassium chloride  10 mEq Oral Daily   torsemide  20 mg Oral Daily   Continuous Infusions:  sodium chloride     PRN Meds:.diclofenac Sodium, guaiFENesin, mouth rinse, polyethylene glycol, senna, simethicone   ASSESMENT:     Resolved burgundy bloody stools a few days ago     A-fib, RVR.  Successful cardioversion 10/31.    Progressive elevation LFTs, likely due to amiodarone.  HCV Ab and HBV surface Ag were negative in 2015.    PLAN     EGD tomorrow 12:30.    As there is no record of prior liver imaging, ordered an ultrasound for tomorrow morning as she will be n.p.o. for the EGD    [name] [name]  09/25/2022, 12:21 PM Phone [phone]     Attending physician's note   I have taken history, reviewed the chart and examined the patient. I performed a substantive portion of this encounter, including complete performance of at least one of the key components, in conjunction with the APP. I agree with the Advanced Practitioner's note, impression and recommendations.   No further bleeding. Hb stable Discussed with [name] and family- EGD tomorrow. No colon Noted increase in LFTs- ?d/t amiodarone. Will also get US liver in AM. Trend LFTs.  D/W Dr Erin Fulling.  Appreciate his help with CS/propofol tomorrow.  Carmell Austria, MD [facility] [phone]

## 2022-09-25 NOTE — Consult Note (Signed)
Ref: Zola Button, MD   Subjective:  Resting comfortably. Communicated with family earlier. VS stable with NSR but respiratory distress continues.  Objective:  Vital Signs in the last 24 hours: Temp:  [97.8 F (36.6 C)-98.6 F (37 C)] 97.8 F (36.6 C) (11/01 0717) Pulse Rate:  [54-85] 67 (11/01 0800) Cardiac Rhythm: Normal sinus rhythm (11/01 0000) Resp:  [18-36] 35 (11/01 0800) BP: (94-130)/(30-82) 116/48 (11/01 0800) SpO2:  [91 %-100 %] 93 % (11/01 0800) FiO2 (%):  [30 %] 30 % (11/01 0320)  Physical Exam: BP Readings from Last 1 Encounters:  09/25/22 (!) 116/48     Wt Readings from Last 1 Encounters:  09/23/22 71.9 kg    Weight change:  Body mass index is 30.96 kg/m. HEENT: [place]/AT, Eyes-Brown, Conjunctiva-Pale, Sclera-Non-icteric Neck: No JVD, No bruit, Trachea midline. Lungs:  Clearing, Bilateral. Cardiac:  Regular rhythm, normal S1 and S2, no S3. II/VI systolic murmur. Abdomen:  Soft, non-tender. BS present. Extremities:  No edema present. No cyanosis. No clubbing. CNS: AxOx1, Cranial nerves grossly intact, moves all 4 extremities.  Skin: Warm and dry.   Intake/Output from previous day: 10/31 0701 - 11/01 0700 In: 494 [I.V.:394; IV Piggyback:100] Out: 950 [Urine:950]    Lab Results: BMET    Component Value Date/Time   NA 139 09/25/2022 0535   NA 138 09/24/2022 0339   NA 140 09/23/2022 0642   NA 141 06/14/2022 1505   NA 141 04/26/2020 1708   NA 141 06/22/2018 1448   K 3.9 09/25/2022 0535   K 4.0 09/24/2022 0339   K 3.8 09/23/2022 0642   CL 98 09/25/2022 0535   CL 96 (L) 09/24/2022 0339   CL 95 (L) 09/23/2022 0642   CO2 33 (H) 09/25/2022 0535   CO2 33 (H) 09/24/2022 0339   CO2 36 (H) 09/23/2022 0642   GLUCOSE 127 (H) 09/25/2022 0535   GLUCOSE 137 (H) 09/24/2022 0339   GLUCOSE 132 (H) 09/23/2022 0642   BUN 26 (H) 09/25/2022 0535   BUN 34 (H) 09/24/2022 0339   BUN 42 (H) 09/23/2022 0642   BUN 17 06/14/2022 1505   BUN 19 04/26/2020 1708   BUN 14  06/22/2018 1448   CREATININE 0.69 09/25/2022 0535   CREATININE 0.72 09/24/2022 0339   CREATININE 0.67 09/23/2022 0642   CALCIUM 8.6 (L) 09/25/2022 0535   CALCIUM 8.6 (L) 09/24/2022 0339   CALCIUM 8.5 (L) 09/23/2022 0642   GFRNONAA >60 09/25/2022 0535   GFRNONAA >60 09/24/2022 0339   GFRNONAA >60 09/23/2022 0642   GFRAA 69 04/26/2020 1708   GFRAA >60 06/15/2019 0841   GFRAA >60 11/13/2018 1941   CBC    Component Value Date/Time   WBC 7.4 09/25/2022 0535   RBC 2.83 (L) 09/25/2022 0535   HGB 8.6 (L) 09/25/2022 0535   HGB 15.2 04/26/2020 1708   HCT 27.1 (L) 09/25/2022 0535   HCT 46.5 04/26/2020 1708   PLT 106 (L) 09/25/2022 0535   PLT 102 (L) 04/26/2020 1708   MCV 95.8 09/25/2022 0535   MCV 94 04/26/2020 1708   MCH 30.4 09/25/2022 0535   MCHC 31.7 09/25/2022 0535   RDW 15.2 09/25/2022 0535   RDW 12.8 04/26/2020 1708   LYMPHSABS 1.1 09/17/2022 0207   LYMPHSABS 1.6 04/26/2020 1708   MONOABS 0.8 09/17/2022 0207   EOSABS 0.0 09/17/2022 0207   EOSABS 0.0 04/26/2020 1708   BASOSABS 0.1 09/17/2022 0207   BASOSABS 0.0 04/26/2020 1708   HEPATIC Function Panel Recent Labs  09/22/22 0207 09/24/22 0339 09/25/22 0535  PROT 5.6* 5.6* 5.3*  ALBUMIN 2.4* 3.0* 2.9*  AST 67* 71* 158*  ALT 62* 66* 134*  ALKPHOS 94 57 68   HEMOGLOBIN A1C Lab Results  Component Value Date   MPG 126 (H) 07/18/2014   CARDIAC ENZYMES Lab Results  Component Value Date   TROPONINI <0.30 03/24/2012   BNP No results for input(s): "PROBNP" in the last 8760 hours. TSH Recent Labs    09/16/22 1929  TSH 1.792   CHOLESTEROL Recent Labs    06/14/22 1505 09/17/22 0207  CHOL 180 161    Scheduled Meds:  amiodarone  200 mg Oral BID   atorvastatin  20 mg Oral QPM   Chlorhexidine Gluconate Cloth  6 each Topical Daily   cycloSPORINE  1 drop Both Eyes BID   digoxin  0.0625 mg Oral Daily   diltiazem  30 mg Oral QID   DULoxetine  20 mg Oral Daily   fentaNYL (SUBLIMAZE) injection  100 mcg  Intravenous Once   gabapentin  100 mg Oral QHS   lactose free nutrition  237 mL Oral TID WC   levETIRAcetam  500 mg Oral BID   metoprolol tartrate  25 mg Oral QID   mouth rinse  15 mL Mouth Rinse 4 times per day   pantoprazole  40 mg Oral Q0600   potassium chloride  10 mEq Oral Daily   Continuous Infusions:  sodium chloride     PRN Meds:.diclofenac Sodium, guaiFENesin, mouth rinse, polyethylene glycol, senna, simethicone  Assessment/Plan: Paroxysmal atrial fibrillation Acute on chronic diastolic left heart failure Acute respiratory failure with hypoxia Recent GI bleed Anemia of blood loss HTN HLD Obesity Old stroke Arthritis Parkinson's disease  Plan: Decrease metoprolol and diltiazem dose. Change IV amiodarone to PO.   LOS: 8 days   Time spent including chart review, lab review, examination, discussion with patient :  min   Dixie Dials  MD  09/25/2022, 9:11 AM

## 2022-09-25 NOTE — Progress Notes (Signed)
NAME:  [name] [name], MRN:  [id number], DOB:  04-30-1930, LOS: 8 ADMISSION DATE:  09/16/2022, CONSULTATION DATE:  10/27 REFERRING MD:  Cardiology, CHIEF COMPLAINT:  Afib, Acute respiratory failure   History of Present Illness:  88 year old female with known atrial fibrillation rapid ventricular response who is scheduled for cardioversion but subsequently has developed acute respiratory distress requiring noninvasive imaging treated with aggressive diuresis.  She will need to be transferred to intensive care unit for closer evaluation.  She may need to be intubated for the cardioversion.  Have discussed this with cardiology who agrees to give the Lasix chance to work she may not require intubation.  Pertinent  Medical History   Past Medical History:  Diagnosis Date   Anxiety    Diabetes ([place]) 10/27/2013   DVT (deep venous thrombosis) ([place])    Essential tremor 10/27/2013   High cholesterol 1999   Hypertension    Ischemic stroke ([place]) 07/18/2014   Obesity, unspecified 10/27/2013   Pneumonia    Unspecified hereditary and idiopathic peripheral neuropathy 10/27/2013   Significant Hospital Events: Including procedures, antibiotic start and stop dates in addition to other pertinent events   10/27 Admitted to ICU 10/29 CT head no acute intracranial abnormalities 10/29 chest x-ray moderate bilateral pleural effusion, R>L 10/31: TEE/DCCV under conscious sedation  Interim History / Subjective:   Patient became bradycardic to the 50s overnight, IV Amio was paused for 2 hours. HR remains in the 60s to 70s this morning She is awake and alert, answering questions appropriately Reports some abdominal discomfort and feeling bloated  Objective   Blood pressure (!) 109/43, pulse (!) 59, temperature 97.8 F (36.6 C), temperature source Oral, resp. rate (!) 27, height 5' (1.524 m), weight 71.9 kg, SpO2 95 %.    FiO2 (%):  [30 %] 30 %   Intake/Output Summary (Last 24 hours) at 09/25/2022  0720 Last data filed at 09/25/2022 0600 Gross per 24 hour  Intake 494 ml  Output 950 ml  Net -456 ml   Filed Weights   09/21/22 0500 09/22/22 0500 09/23/22 0343  Weight: 71.8 kg 72.2 kg 71.9 kg    Examination: General: Acutely ill-appearing elderly lady laying in bed.  NAD. HENT: [place]/AT.  Moist mucous membrane. Lungs: Remains tachypneic on HFNC.  Decreased breath sounds anteriorly and at the bases.  No WOB Cardiovascular: Regular rate and rhythm.  S1, S2, No m/r/g.  Abdomen: Soft. Mildly tender diffusely. Normal BS. Extremities: Warm and well perfused.  Trace BLE pitting edema Neuro: Awake.  Interactive and following commands.  Moving all extremities.  WBC 7.4, Hgb 8.6, platelet 106 K+ 3.9, bicarb 33, creatinine 0.69, AST/ALT trending up to 158/134 Repeat chest x-ray shows persistent bibasilar pleural effusions and atelectasis  Resolved Hospital Problem list    Assessment & Plan:  Acute hypoxic respiratory failure Acute on chronic diastolic heart failure Pulmonary edema Bilateral pleural effusions Remains on high flow O2 overnight at 15 L with FiO2 30%.  Still tachypneic this morning but no increased work of breathing.  She had good UOP of 950 cc without IV lasix. We will resume diuresis today and wean down oxygen as tolerated. -Continue supplemental O2, wean for O2 sats >92% -Start torsemide 20 mg daily -Strict I&O's, daily weight -PRN Guaifenesin for cough  Atrial fibrillation She remains in normal sinus overnight with occasional PVCs.  Heart rates stable in the 60s to 70s.  IV amnio held briefly overnight due to drop in HR to the 50s while patient was  sleeping. -Cardiology following, recs below    -Change Amiodarone to po 200 mg BID for 5 days, then 200 mg daily     -Change diltiazem 60 mg q8h to 30 mg QID     -Change metoprolol 50 mg TID to 25 mg QID -Continue to hold Eliquis for now -Trend and replete electrolytes  GI bleed Patient found to have large melanotic  stool on 10/29.  No evidence of GI bleeds since then. Hgb stable at 8.6. Pending possible EGD by GI before resuming eliquis. -GI following, appreciate assistance -Continue IV PPI BID -Continue to hold Eliquis -Transfuse for Hgb < 7   HTN Blood pressure has been stable with SBP in the 100s-130s.  -Continue metoprolol -CTM -MAP goal >65  Bacteriuria Microscopic hematuria Patient found to have positive nitrate, leukocytes, many bacteria, >50 WBCs, and >50 RBCs on UA. -Complete course of Rocephin today (day 3 of 3)  HLD -Continue Lipitor 20 mg daily   Essential tremor Anxiety Per granddaughter, patient does not have a diagnosis of Parkinson's.  She does have essential tremor that has been stable. -Continue keppra -Continue gabapentin and duloxetine -CTM  Generalized deconditioning Goals of care PT recommended SNF placement.  Family reports they have secure private outpatient rehab for patient when she is ready for discharge home. -TOC following, appreciate recs -Palliative care following, appreciate recs  Best Practice (right click and "Reselect all SmartList Selections" daily)   Diet/type: full liquids  DVT prophylaxis: SCD GI prophylaxis: PPI Lines: N/A Foley:  N/A Code Status:  DNR Last date of multidisciplinary goals of care discussion [Updated family at bedside]  Labs   CBC: Recent Labs  Lab 09/23/22 1251 09/23/22 1805 09/23/22 2300 09/24/22 0339 09/25/22 0535  WBC 7.8 8.3 7.5 7.6 7.4  HGB 9.2* 9.7* 9.0* 9.0* 8.6*  HCT 27.5* 27.4* 26.6* 26.9* 27.1*  MCV 92.6 89.5 92.7 94.4 95.8  PLT 97* 95* 104* 101* 106*    Basic Metabolic Panel: Recent Labs  Lab 09/21/22 0253 09/22/22 0207 09/23/22 0642 09/24/22 0339 09/25/22 0535  NA 139 136 140 138 139  K 3.9 4.1 3.8 4.0 3.9  CL 94* 94* 95* 96* 98  CO2 33* 35* 36* 33* 33*  GLUCOSE 135* 118* 132* 137* 127*  BUN 39* 54* 42* 34* 26*  CREATININE 0.90 0.80 0.67 0.72 0.69  CALCIUM 8.4* 8.0* 8.5* 8.6* 8.6*  MG  2.0 1.9 2.2 2.0 2.1   GFR: Estimated Creatinine Clearance: 42.2 mL/min (by C-G formula based on SCr of 0.69 mg/dL). Recent Labs  Lab 09/23/22 1805 09/23/22 2300 09/24/22 0339 09/25/22 0535  WBC 8.3 7.5 7.6 7.4    Liver Function Tests: Recent Labs  Lab 09/20/22 0035 09/21/22 0253 09/22/22 0207 09/24/22 0339 09/25/22 0535  AST 57* 41 67* 71* 158*  ALT 61* 51* 62* 66* 134*  ALKPHOS 112 105 94 57 68  BILITOT 0.7 0.7 0.4 0.4 0.4  PROT 6.4* 6.2* 5.6* 5.6* 5.3*  ALBUMIN 2.8* 2.6* 2.4* 3.0* 2.9*   No results for input(s): "LIPASE", "AMYLASE" in the last 168 hours. No results for input(s): "AMMONIA" in the last 168 hours.  ABG    Component Value Date/Time   PHART 7.43 09/20/2022 0833   PCO2ART 61 (H) 09/20/2022 0833   PO2ART 204 (H) 09/20/2022 0833   HCO3 41.2 (H) 09/20/2022 1554   TCO2 32 12/08/2010 2038   O2SAT 90.2 09/20/2022 1554     Coagulation Profile: No results for input(s): "INR", "PROTIME" in the last 168 hours.  Cardiac Enzymes: No results for input(s): "CKTOTAL", "CKMB", "CKMBINDEX", "TROPONINI" in the last 168 hours.  HbA1C: Hemoglobin A1C  Date/Time Value Ref Range Status  11/30/2020 12:09 PM 5.4 4.0 - 5.6 % Final   HbA1c, POC (controlled diabetic range)  Date/Time Value Ref Range Status  06/14/2022 12:15 PM 5.6 0.0 - 7.0 % Final  12/17/2018 03:56 PM 5.3 0.0 - 7.0 % Final    CBG: Recent Labs  Lab 09/20/22 1145  GLUCAP 126*    Review of Systems:   As in HPI  Past Medical History:  She,  has a past medical history of Anxiety, Diabetes ([place]) (10/27/2013), DVT (deep venous thrombosis) ([place]), Essential tremor (10/27/2013), High cholesterol (1999), Hypertension, Ischemic stroke ([place]) (07/18/2014), Obesity, unspecified (10/27/2013), Pneumonia, and Unspecified hereditary and idiopathic peripheral neuropathy (10/27/2013).   Surgical History:   Past Surgical History:  Procedure Laterality Date   CATARACT EXTRACTION Bilateral    FOOT SURGERY     IR  KYPHO LUMBAR INC FX REDUCE BONE BX UNI/BIL CANNULATION INC/IMAGING  06/15/2019     Social History:   reports that she has never smoked. She has never used smokeless tobacco. She reports that she does not drink alcohol and does not use drugs.   Family History:  Her family history includes Coronary artery disease in an other family member; Heart Problems in her brother and sister; Stroke in her mother. There is no history of Tremor.   Allergies Allergies  Allergen Reactions   Tape Rash    Please do not use Plastic Tape     Home Medications  Prior to Admission medications   Medication Sig Start Date End Date Taking? Authorizing Provider  acetaminophen (TYLENOL) 325 MG tablet Take 2 tablets (650 mg total) by mouth every 6 (six) hours as needed. Patient taking differently: Take 325 mg by mouth every 6 (six) hours as needed for moderate pain. 06/01/17  Yes Varney Biles, MD  ALPRAZolam (XANAX) 0.25 MG tablet Take 0.25 mg by mouth 2 (two) times daily as needed for anxiety.    Yes [provider]  amLODipine (NORVASC) 2.5 MG tablet Take 2.5 mg by mouth daily. 05/29/17  Yes [provider]  atorvastatin (LIPITOR) 20 MG tablet TAKE 1 TABLET BY MOUTH EVERY DAY IN THE EVENING Patient taking differently: Take 20 mg by mouth every evening. 05/10/22  Yes Zola Button, MD  CALCIUM PO Take 1 tablet by mouth daily.   Yes [provider]  Cholecalciferol (VITAMIN D PO) Take 1 tablet by mouth daily.   Yes [provider]  clopidogrel (PLAVIX) 75 MG tablet TAKE 1 TABLET BY MOUTH EVERY DAY Patient taking differently: Take 75 mg by mouth once. 06/17/22  Yes Zola Button, MD  diclofenac Sodium (VOLTAREN) 1 % GEL Apply 2 g topically 4 (four) times daily as needed. 07/26/22  Yes Zola Button, MD  DULoxetine (CYMBALTA) 20 MG capsule TAKE 1 CAPSULE BY MOUTH EVERY DAY Patient taking differently: Take 20 mg by mouth daily. 01/07/22  Yes Jessy Oto, MD  furosemide (LASIX) 20 MG  tablet Take 20 mg by mouth daily. 06/08/18  Yes [provider]  gabapentin (NEURONTIN) 100 MG capsule Take 1 capsule (100 mg total) by mouth at bedtime. Patient taking differently: Take 100 mg by mouth in the morning and at bedtime. 03/05/22  Yes Star Age, MD  isosorbide mononitrate (IMDUR) 30 MG 24 hr tablet Take 15 mg by mouth daily. 08/09/22  Yes [provider]  levETIRAcetam (KEPPRA) 500 MG tablet  Take 1 tablet (500 mg total) by mouth 2 (two) times daily. 03/05/22  Yes Star Age, MD  lidocaine (LIDODERM) 5 % Place 1 patch onto the skin daily. Remove & Discard patch within 12 hours or as directed by MD 05/10/22  Yes Zola Button, MD  PAZEO 0.7 % SOLN Place 1 drop into both eyes at bedtime.  07/24/18  Yes [provider]  primidone (MYSOLINE) 250 MG tablet Take 1 tablet (250 mg total) by mouth 2 (two) times daily. 03/05/22  Yes Star Age, MD  RESTASIS 0.05 % ophthalmic emulsion Place 1 drop into both eyes 2 (two) times daily.  09/29/14  Yes [provider]  [place] test strip USE AS INSTRUCTED TO TEST ONCE A DAY DX CODE:E11.9 11/22/19   Benay Pike, MD  Accu-Chek Softclix Lancets lancets Use as instructed 09/23/20   Benay Pike, MD     Critical care time: 64

## 2022-09-25 NOTE — Progress Notes (Signed)
Physical Therapy Treatment Patient Details Name: [name] [name] MRN: [id number] DOB: July 09, 1930 Today's Date: 09/25/2022   History of Present Illness Pt is an 88 y.o. female admitted 09/16/22 with worsening BLE weakness, SOB. Pt with new onset AFib with RVR as well as new CHF and GIB. PMH includes HTN, DVT, DM, essential tremor, stroke (2015), anxiety.    PT Comments    Pt remains limited by increased work of breathing, although she has been able to significantly wean supplemental oxygen needs since last session. Pt fatigues quickly, likely expending great amounts of energy with full body tremors throughout session. Pt continues to require significant physical assistance for all functional mobility activities and will benefit from acute PT services to improve strength and endurance. PT continues to recommend SNF placement at this time.   Recommendations for follow up therapy are one component of a multi-disciplinary discharge planning process, led by the attending physician.  Recommendations may be updated based on patient status, additional functional criteria and insurance authorization.  Follow Up Recommendations  Skilled nursing-short term rehab (<3 hours/day) Can patient physically be transported by private vehicle: No   Assistance Recommended at Discharge Frequent or constant Supervision/Assistance  Patient can return home with the following Two people to help with walking and/or transfers;Two people to help with bathing/dressing/bathroom;Direct supervision/assist for medications management;Assistance with feeding;Assist for transportation;Assistance with cooking/housework;Direct supervision/assist for financial management   Equipment Recommendations  Hospital bed;Wheelchair (measurements PT);BSC/3in1;Other (comment) (hoyer lift)    Recommendations for Other Services       Precautions / Restrictions Precautions Precautions: Fall;Other (comment) Precaution Comments: Watch HR and  O2 Restrictions Weight Bearing Restrictions: No     Mobility  Bed Mobility Overal bed mobility: Needs Assistance Bed Mobility: Supine to Sit, Sit to Supine     Supine to sit: Max assist, +2 for physical assistance, HOB elevated Sit to supine: Total assist, +2 for physical assistance        Transfers                        Ambulation/Gait                   Stairs             Wheelchair Mobility    Modified Rankin (Stroke Patients Only)       Balance Overall balance assessment: Needs assistance Sitting-balance support: Single extremity supported, Bilateral upper extremity supported, Feet unsupported Sitting balance-Leahy Scale: Poor Sitting balance - Comments: mod-maxA for majority of session Postural control: Left lateral lean, Posterior lean                                  Cognition Arousal/Alertness: Awake/alert Behavior During Therapy: Flat affect Overall Cognitive Status: Difficult to assess Area of Impairment: Following commands                       Following Commands: Follows one step commands with increased time       General Comments: pt communicates appropriately via very basic english, son interprets for more complex questions/thoughts        Exercises General Exercises - Lower Extremity Ankle Circles/Pumps: AROM, Both, 5 reps Heel Slides: AROM, Both, 5 reps Straight Leg Raises: AROM, Both, 5 reps    General Comments General comments (skin integrity, edema, etc.): pt on 8L HFNC, SpO2 stable during session. Pt with  significant tremors throughout entire body      Pertinent Vitals/Pain Pain Assessment Pain Assessment: No/denies pain    Home Living                          Prior Function            PT Goals (current goals can now be found in the care plan section) Acute Rehab PT Goals Patient Stated Goal: to continue to progress, regain mobility Progress towards PT goals: Not  progressing toward goals - comment (limited by work of breathing)    Frequency    Min 3X/week      PT Plan Current plan remains appropriate    Co-evaluation PT/OT/SLP Co-Evaluation/Treatment: Yes            AM-PAC PT "6 Clicks" Mobility   Outcome Measure  Help needed turning from your back to your side while in a flat bed without using bedrails?: A Lot Help needed moving from lying on your back to sitting on the side of a flat bed without using bedrails?: A Lot Help needed moving to and from a bed to a chair (including a wheelchair)?: Total Help needed standing up from a chair using your arms (e.g., wheelchair or bedside chair)?: Total Help needed to walk in hospital room?: Total Help needed climbing 3-5 steps with a railing? : Total 6 Click Score: 8    End of Session Equipment Utilized During Treatment: Gait belt;Oxygen Activity Tolerance: Patient tolerated treatment well Patient left: with call bell/phone within reach;in bed;with family/visitor present Nurse Communication: Mobility status;Need for lift equipment PT Visit Diagnosis: Unsteadiness on feet (R26.81);Muscle weakness (generalized) (M62.81);Difficulty in walking, not elsewhere classified (R26.2)     Time: 1107-1140 PT Time Calculation (min) (ACUTE ONLY): 33 min  Charges:  $Therapeutic Activity: 8-22 mins                     Arlyss Gandy, PT, DPT Acute Rehabilitation Office [phone]    Arlyss Gandy 09/25/2022, 12:52 PM

## 2022-09-26 ENCOUNTER — Encounter (HOSPITAL_COMMUNITY): Payer: Self-pay | Admitting: Anesthesiology

## 2022-09-26 ENCOUNTER — Inpatient Hospital Stay (HOSPITAL_COMMUNITY): Payer: Medicare Other

## 2022-09-26 ENCOUNTER — Encounter (HOSPITAL_COMMUNITY)
Admission: EM | Disposition: A | Discharge status: Rehabilitation Facility | Payer: Self-pay | Source: Home / Self Care | Attending: Pulmonary Disease

## 2022-09-26 DIAGNOSIS — I4891 Unspecified atrial fibrillation: Secondary | ICD-10-CM | POA: Diagnosis not present

## 2022-09-26 DIAGNOSIS — D62 Acute posthemorrhagic anemia: Secondary | ICD-10-CM | POA: Diagnosis not present

## 2022-09-26 DIAGNOSIS — I5033 Acute on chronic diastolic (congestive) heart failure: Secondary | ICD-10-CM | POA: Diagnosis not present

## 2022-09-26 DIAGNOSIS — Z515 Encounter for palliative care: Secondary | ICD-10-CM | POA: Diagnosis not present

## 2022-09-26 HISTORY — PX: ESOPHAGOGASTRODUODENOSCOPY (EGD) WITH PROPOFOL: SHX5813

## 2022-09-26 HISTORY — PX: BIOPSY: SHX5522

## 2022-09-26 LAB — COMPREHENSIVE METABOLIC PANEL
ALT: 122 U/L — ABNORMAL HIGH (ref 0–44)
AST: 107 U/L — ABNORMAL HIGH (ref 15–41)
Albumin: 2.7 g/dL — ABNORMAL LOW (ref 3.5–5.0)
Alkaline Phosphatase: 75 U/L (ref 38–126)
Anion gap: 10 (ref 5–15)
BUN: 21 mg/dL (ref 8–23)
CO2: 33 mmol/L — ABNORMAL HIGH (ref 22–32)
Calcium: 8.2 mg/dL — ABNORMAL LOW (ref 8.9–10.3)
Chloride: 96 mmol/L — ABNORMAL LOW (ref 98–111)
Creatinine, Ser: 0.77 mg/dL (ref 0.44–1.00)
GFR, Estimated: >60 mL/min (ref >60)
Glucose, Bld: 129 mg/dL — ABNORMAL HIGH (ref 70–99)
Potassium: 3.4 mmol/L — ABNORMAL LOW (ref 3.5–5.1)
Sodium: 139 mmol/L (ref 135–145)
Total Bilirubin: 0.6 mg/dL (ref 0.3–1.2)
Total Protein: 5.3 g/dL — ABNORMAL LOW (ref 6.5–8.1)

## 2022-09-26 LAB — CBC
HCT: 26.8 % — ABNORMAL LOW (ref 36.0–46.0)
Hemoglobin: 8.5 g/dL — ABNORMAL LOW (ref 12.0–15.0)
MCH: 30.2 pg (ref 26.0–34.0)
MCHC: 31.7 g/dL (ref 30.0–36.0)
MCV: 95.4 fL (ref 80.0–100.0)
Platelets: 102 10*3/uL — ABNORMAL LOW (ref 150–400)
RBC: 2.81 MIL/uL — ABNORMAL LOW (ref 3.87–5.11)
RDW: 15 % (ref 11.5–15.5)
WBC: 8.7 10*3/uL (ref 4.0–10.5)
nRBC: 0.2 % (ref 0.0–0.2)

## 2022-09-26 LAB — MAGNESIUM: Magnesium: 1.8 mg/dL (ref 1.7–2.4)

## 2022-09-26 SURGERY — ESOPHAGOGASTRODUODENOSCOPY (EGD) WITH PROPOFOL
Anesthesia: Moderate Sedation

## 2022-09-26 MED ORDER — PROPOFOL 1000 MG/100ML IV EMUL
5.0000 ug/kg/min | INTRAVENOUS | Status: DC
  Administered 2022-09-26: 10 ug/kg/min via INTRAVENOUS
  Filled 2022-09-26: qty 100

## 2022-09-26 MED ORDER — NOREPINEPHRINE 4 MG/250ML-% IV SOLN
2.0000 ug/min | INTRAVENOUS | Status: DC
  Administered 2022-09-26: 2 ug/min via INTRAVENOUS
  Filled 2022-09-26: qty 250

## 2022-09-26 MED ORDER — POTASSIUM CHLORIDE 10 MEQ/100ML IV SOLN
10.0000 meq | INTRAVENOUS | Status: DC
  Administered 2022-09-26: 10 meq via INTRAVENOUS
  Filled 2022-09-26: qty 100

## 2022-09-26 MED ORDER — POTASSIUM CHLORIDE CRYS ER 20 MEQ PO TBCR
40.0000 meq | EXTENDED_RELEASE_TABLET | Freq: Once | ORAL | Status: AC
  Administered 2022-09-26: 40 meq via ORAL
  Filled 2022-09-26: qty 2

## 2022-09-26 MED ORDER — MIDAZOLAM HCL 2 MG/2ML IJ SOLN
4.0000 mg | Freq: Once | INTRAMUSCULAR | Status: AC
  Administered 2022-09-26: 2.5 mg via INTRAVENOUS
  Filled 2022-09-26: qty 4

## 2022-09-26 MED ORDER — SODIUM CHLORIDE 0.9 % IV SOLN: 250.0000 mL | INTRAVENOUS | Status: DC

## 2022-09-26 MED ORDER — MAGNESIUM SULFATE 2 GM/50ML IV SOLN
2.0000 g | Freq: Once | INTRAVENOUS | Status: AC
  Administered 2022-09-26: 2 g via INTRAVENOUS
  Filled 2022-09-26: qty 50

## 2022-09-26 MED ORDER — POTASSIUM CHLORIDE CRYS ER 10 MEQ PO TBCR
10.0000 meq | EXTENDED_RELEASE_TABLET | Freq: Two times a day (BID) | ORAL | Status: DC
  Administered 2022-09-26 – 2022-10-04 (×16): 10 meq via ORAL
  Filled 2022-09-26 (×16): qty 1

## 2022-09-26 SURGICAL SUPPLY — 15 items

## 2022-09-26 NOTE — Progress Notes (Signed)
Palliative Medicine Progress Note   Patient Name: [name] [name]       Date: 09/26/2022 DOB: 1930/09/16  Age: 88 y.o. MRN#: [id number] Attending Physician: Freddi Starr, MD Primary Care Physician: Zola Button, MD Admit Date: 09/16/2022    HPI/Patient Profile: 88 y.o. female  with past medical history of seizure disorder, essential tremor, HTN, HLD, and previous CVA who presented to [facility] ED on 09/16/2022 with leg weakness and shortness of breath.  In the ED, she was found to have new onset atrial fibrillation with RVR and was started on diltiazem drip.  She was initially admitted to [facility]MT[facility].  Seen by cardiology and plan was for TEE with cardioversion.  On 10/27, she developed acute hypoxic respiratory failure and required BiPAP.  PCCM was consulted and she was transferred to ICU.  On 10/28-29, she developed GI bleeding. Palliative Medicine was consulted for goals of care.    10/31 - underwent bedside cardioversion with successful conversion to sinus rhythm  11/2 - underwent EGD which showed nonbleeding gastric ulcer   Subjective: Chart reviewed and patient assessed at bedside.  She underwent EGD earlier today and remains sleepy from the sedation.  Granddaughter/Shiva is present at bedside.  We reviewed patient's hospital course since I last saw her on she reports that her grandmother tolerated post cardioversion and the EGD quite well.  We discussed plan to hold anticoagulation for 2 weeks; family is aware of subsequent increased risk of thromboembolic event if she goes back into a-fib.   Sedalia Muta confirms that at this time, goal of care continues to be medical stabilization and then rehab to improve functional status. She also shares that family is very realistic and would consider a shift to  comfort care if there is acute decompensation or failure to improve.    Objective:  Physical Exam Constitutional:      General: She is sleeping. She is not in acute distress.    Appearance: She is ill-appearing.     Comments: Frail  Cardiovascular:     Rate and Rhythm: Normal rate.  Pulmonary:     Effort: Pulmonary effort is normal.             Vital Signs: BP (!) 109/43   Pulse 66   Temp 98.1 F (36.7 C) (Axillary)   Resp (!) 31  Ht 5' (1.524 m)   Wt 72.5 kg   SpO2 96%   BMI 31.22 kg/m  SpO2: SpO2: 96 % O2 Device: O2 Device: Nasal Cannula O2 Flow Rate: O2 Flow Rate (L/min): 2 L/min   LBM: Last BM Date : 09/23/22    Palliative Medicine Assessment & Plan   Assessment: Principal Problem:   Atrial fibrillation with RVR (HCC) Active Problems:   Essential tremor   History of peripheral neuropathy   HTN (hypertension)   HLD (hyperlipidemia)   Acute on chronic diastolic heart failure (HCC)   Left hip pain    Recommendations/Plan: DNR/DNI as previously documented Continue current interventions Goal of care is medical stabilization Disposition plan is rehab to improve functional status PMT will continue to follow  Prognosis:  Guarded    Thank you for allowing the Palliative Medicine Team to assist in the care of this patient.   MDM - High   Lavena Bullion, NP   Please contact Palliative Medicine Team phone at [phone] for questions and concerns.  For individual providers, please see AMION.

## 2022-09-26 NOTE — Progress Notes (Signed)
Pharmacy Electrolyte Replacement  Recent Labs:  Recent Labs    09/26/22 0240  K 3.4*  MG 1.8  CREATININE 0.77    Low Critical Values (K </= 2.5, Phos </= 1, Mg </= 1) Present: None  MD Contacted: Linwood Dibbles, MD  Plan: Potassium already repleted overnight Add-on magnesium slightly low 1.8 (goal >/=2 with afib) - Give mag sulfate 2g IV x 1 Recheck labs in AM per protocol   Arturo Morton, PharmD, BCPS Please check AMION for all [place] contact numbers Clinical Pharmacist 09/26/2022 10:06 AM

## 2022-09-26 NOTE — Procedures (Addendum)
Moderate Conscious Sedation Note    [name] [name]  [id number]  [birth year]   Date:09/26/22  Time:2:24 PM   Provider Performing:Hady Niemczyk B Adlean Hardeman   Procedure: Moderate Conscious Sedation  During this procedure the patient is administered a total of Versed 2.5 mg and propofol 20-30mg  to achieve and maintain moderate conscious sedation. The patient's heart rate, blood pressure, and oxygen saturation are monitored continuously during the procedure. The period of conscious sedation is 15 minutes, of which I was present face-to-face 100% of this time.   Freda Jackson, MD [facility] Office: [phone]   See Amion for personal pager PCCM on call pager [phone] until 7pm. Please call Elink 7p-7a. [phone]

## 2022-09-26 NOTE — Consult Note (Signed)
Ref: Zola Button, MD   Subjective:  Awake. VS stable. Monitor: sinus rhythm. Mildly low magnesium and potassium.  Objective:  Vital Signs in the last 24 hours: Temp:  [97.8 F (36.6 C)-98.5 F (36.9 C)] 98.1 F (36.7 C) (11/02 0715) Pulse Rate:  [54-97] 61 (11/02 1200) Cardiac Rhythm: Normal sinus rhythm (11/02 0330) Resp:  [20-31] 27 (11/02 1200) BP: (96-127)/(38-88) 123/50 (11/02 1200) SpO2:  [93 %-100 %] 93 % (11/02 1200) FiO2 (%):  [30 %-35 %] 35 % (11/02 0000) Weight:  [72.5 kg] 72.5 kg (11/02 0500)  Physical Exam: BP Readings from Last 1 Encounters:  09/26/22 (!) 123/50     Wt Readings from Last 1 Encounters:  09/26/22 72.5 kg    Weight change:  Body mass index is 31.22 kg/m. HEENT: [place]/AT, Eyes-Brown, Conjunctiva-Pale, Sclera-Non-icteric Neck: No JVD, No bruit, Trachea midline. Lungs:  Clearing, Bilateral. Cardiac:  Regular rhythm, normal S1 and S2, no S3. II/VI systolic murmur. Abdomen:  Soft, non-tender. BS present. Extremities:  No edema present. No cyanosis. No clubbing. CNS: AxOx2, Cranial nerves grossly intact, moves all 4 extremities.  Skin: Warm and dry.   Intake/Output from previous day: 11/01 0701 - 11/02 0700 In: 366.8 [I.V.:366.8] Out: 1950 [Urine:1950]    Lab Results: BMET    Component Value Date/Time   NA 139 09/26/2022 0240   NA 139 09/25/2022 0535   NA 138 09/24/2022 0339   NA 141 06/14/2022 1505   NA 141 04/26/2020 1708   NA 141 06/22/2018 1448   K 3.4 (L) 09/26/2022 0240   K 3.9 09/25/2022 0535   K 4.0 09/24/2022 0339   CL 96 (L) 09/26/2022 0240   CL 98 09/25/2022 0535   CL 96 (L) 09/24/2022 0339   CO2 33 (H) 09/26/2022 0240   CO2 33 (H) 09/25/2022 0535   CO2 33 (H) 09/24/2022 0339   GLUCOSE 129 (H) 09/26/2022 0240   GLUCOSE 127 (H) 09/25/2022 0535   GLUCOSE 137 (H) 09/24/2022 0339   BUN 21 09/26/2022 0240   BUN 26 (H) 09/25/2022 0535   BUN 34 (H) 09/24/2022 0339   BUN 17 06/14/2022 1505   BUN 19 04/26/2020 1708   BUN  14 06/22/2018 1448   CREATININE 0.77 09/26/2022 0240   CREATININE 0.69 09/25/2022 0535   CREATININE 0.72 09/24/2022 0339   CALCIUM 8.2 (L) 09/26/2022 0240   CALCIUM 8.6 (L) 09/25/2022 0535   CALCIUM 8.6 (L) 09/24/2022 0339   GFRNONAA >60 09/26/2022 0240   GFRNONAA >60 09/25/2022 0535   GFRNONAA >60 09/24/2022 0339   GFRAA 69 04/26/2020 1708   GFRAA >60 06/15/2019 0841   GFRAA >60 11/13/2018 1941   CBC    Component Value Date/Time   WBC 8.7 09/26/2022 0240   RBC 2.81 (L) 09/26/2022 0240   HGB 8.5 (L) 09/26/2022 0240   HGB 15.2 04/26/2020 1708   HCT 26.8 (L) 09/26/2022 0240   HCT 46.5 04/26/2020 1708   PLT 102 (L) 09/26/2022 0240   PLT 102 (L) 04/26/2020 1708   MCV 95.4 09/26/2022 0240   MCV 94 04/26/2020 1708   MCH 30.2 09/26/2022 0240   MCHC 31.7 09/26/2022 0240   RDW 15.0 09/26/2022 0240   RDW 12.8 04/26/2020 1708   LYMPHSABS 1.1 09/17/2022 0207   LYMPHSABS 1.6 04/26/2020 1708   MONOABS 0.8 09/17/2022 0207   EOSABS 0.0 09/17/2022 0207   EOSABS 0.0 04/26/2020 1708   BASOSABS 0.1 09/17/2022 0207   BASOSABS 0.0 04/26/2020 1708   HEPATIC Function Panel Recent  Labs    09/24/22 0339 09/25/22 0535 09/26/22 0240  PROT 5.6* 5.3* 5.3*  ALBUMIN 3.0* 2.9* 2.7*  AST 71* 158* 107*  ALT 66* 134* 122*  ALKPHOS 57 68 75   HEMOGLOBIN A1C Lab Results  Component Value Date   MPG 126 (H) 07/18/2014   CARDIAC ENZYMES Lab Results  Component Value Date   TROPONINI <0.30 03/24/2012   BNP No results for input(s): "PROBNP" in the last 8760 hours. TSH Recent Labs    09/16/22 1929  TSH 1.792   CHOLESTEROL Recent Labs    06/14/22 1505 09/17/22 0207  CHOL 180 161    Scheduled Meds:  amiodarone  200 mg Oral BID   Followed by   Derrill Memo ON 09/30/2022] amiodarone  200 mg Oral Daily   atorvastatin  20 mg Oral QPM   Chlorhexidine Gluconate Cloth  6 each Topical Daily   cycloSPORINE  1 drop Both Eyes BID   digoxin  0.0625 mg Oral Daily   diltiazem  30 mg Oral QID    DULoxetine  20 mg Oral Daily   fentaNYL (SUBLIMAZE) injection  100 mcg Intravenous Once   gabapentin  100 mg Oral QHS   lactose free nutrition  237 mL Oral TID WC   levETIRAcetam  500 mg Oral BID   metoprolol tartrate  25 mg Oral QID   midazolam  4 mg Intravenous Once   mouth rinse  15 mL Mouth Rinse 4 times per day   pantoprazole  40 mg Oral Q0600   potassium chloride  10 mEq Oral BID   torsemide  20 mg Oral Daily   Continuous Infusions:  sodium chloride 20 mL/hr at 09/26/22 0603   sodium chloride Stopped (09/26/22 0558)   sodium chloride     norepinephrine (LEVOPHED) Adult infusion     propofol (DIPRIVAN) infusion     PRN Meds:.diclofenac Sodium, guaiFENesin, mouth rinse, polyethylene glycol, senna, simethicone  Assessment/Plan: Paroxysmal atrial fibrillation Acute on chronic diastolic left heart failure Acute respiratory failure with hypoxia Recent GI bleed Anemia of blood loss HTN HLD Old stroke Obesity Arthritis Parkinson's disease  Plan: Potassium supplement. IV magnesium already given. Increase activity as tolerated. Awaiting GI evaluation.   LOS: 9 days   Time spent including chart review, lab review, examination, discussion with patient/Family : 30 min   Dixie Dials  MD  09/26/2022, 12:47 PM

## 2022-09-26 NOTE — Progress Notes (Signed)
eLink Physician-Brief Progress Note Patient Name: GRISEL BLUMENSTOCK DOB: 09/15/1930 MRN: [id number]   Date of Service  09/26/2022  HPI/Events of Note  Notified of bladder scanned at 0400;  405cc in bladder, pt urinated and put out 150. Patient has had 350 total output for the night. Nurse wants to know if you want to give fluids or anything else for this  H/H 8.5/26.8 Fluid negative 4 liters since admission Not on pressors  eICU Interventions  Will continue to monitor for now Bedside rounding team to reassess fluid status     Intervention Category Intermediate Interventions: Oliguria - evaluation and management  Judd Lien 09/26/2022, 5:30 AM

## 2022-09-26 NOTE — Interval H&P Note (Signed)
History and Physical Interval Note:  09/26/2022 2:07 PM  [name] [name]  has presented today for surgery, with the diagnosis of GI bleed.  The various methods of treatment have been discussed with the patient and family. After consideration of risks, benefits and other options for treatment, the patient has consented to  Procedure(s): ESOPHAGOGASTRODUODENOSCOPY (EGD) WITH PROPOFOL (N/A) as a surgical intervention.  The patient's history has been reviewed, patient examined, no change in status, stable for surgery.  I have reviewed the patient's chart and labs.  Questions were answered to the patient's satisfaction.     [name] [name]

## 2022-09-26 NOTE — Progress Notes (Signed)
[facility] ADULT ICU REPLACEMENT PROTOCOL   The patient does apply for the [facility] Adult ICU Electrolyte Replacment Protocol based on the criteria listed below:   1.Exclusion criteria: TCTS patients, ECMO patients, and Dialysis patients 2. Is GFR >/= 30 ml/min? Yes.    Patient's GFR today is >60 3. Is SCr </= 2? Yes.   Patient's SCr is 0.77 mg/dL 4. Did SCr increase >/= 0.5 in 24 hours? No. 5.Pt's weight >40kg  Yes.   6. Abnormal electrolyte(s): K+ 3.4  7. Electrolytes replaced per protocol 8.  Call MD STAT for K+ </= 2.5, Phos </= 1, or Mag </= 1 Physician:  n/a  [name] [name] 09/26/2022 4:50 AM

## 2022-09-26 NOTE — Progress Notes (Signed)
NAME:  [name] [name], MRN:  [id number], DOB:  08-07-1930, LOS: 9 ADMISSION DATE:  09/16/2022, CONSULTATION DATE:  10/27 REFERRING MD:  Cardiology, CHIEF COMPLAINT:  Afib, Acute respiratory failure   History of Present Illness:  88 year old female with known atrial fibrillation rapid ventricular response who is scheduled for cardioversion but subsequently has developed acute respiratory distress requiring noninvasive imaging treated with aggressive diuresis.  She will need to be transferred to intensive care unit for closer evaluation.  She may need to be intubated for the cardioversion.  Have discussed this with cardiology who agrees to give the Lasix chance to work she may not require intubation.  Pertinent  Medical History   Past Medical History:  Diagnosis Date   Anxiety    Diabetes ([place]) 10/27/2013   DVT (deep venous thrombosis) ([place])    Essential tremor 10/27/2013   High cholesterol 1999   Hypertension    Ischemic stroke ([place]) 07/18/2014   Obesity, unspecified 10/27/2013   Pneumonia    Unspecified hereditary and idiopathic peripheral neuropathy 10/27/2013   Significant Hospital Events: Including procedures, antibiotic start and stop dates in addition to other pertinent events   10/27 Admitted to ICU 10/29 CT head no acute intracranial abnormalities 10/29 chest x-ray moderate bilateral pleural effusion, R>L 10/31: TEE/DCCV under conscious sedation  Interim History / Subjective:  NPO overnight for EGD Tolerated some BIPAP overnight Remains on Nasal cannula Alert and following commands this AM Denies any pain  Objective   Blood pressure (!) 121/51, pulse 60, temperature 98.1 F (36.7 C), temperature source Axillary, resp. rate (!) 30, height 5' (1.524 m), weight 72.5 kg, SpO2 100 %.    FiO2 (%):  [30 %-35 %] 35 %   Intake/Output Summary (Last 24 hours) at 09/26/2022 0719 Last data filed at 09/26/2022 0600 Gross per 24 hour  Intake 366.82 ml  Output 1950 ml  Net  -1583.18 ml   Filed Weights   09/22/22 0500 09/23/22 0343 09/26/22 0500  Weight: 72.2 kg 71.9 kg 72.5 kg   Examination: General: Acutely ill elderly woman comfortable in bed.  NAD. HENT: [place]/AT. Moist mucous membrane. Lungs: On 2 LNC.  Lungs CTAB.  No WOB.  No wheezing or rales. Cardiovascular: Regular rate and rhythm.  S1, S2, No m/r/g.  Abdomen: Soft.  NT/ND.  Normal bowel sounds. Extremities: Warm and well perfused.  No LE edema. Neuro: Awake and alert.  Moving all extremities.  Following commands.  Mag 1.8, K+ 3.4, bicarb 33, creatinine 0.77 WBC 8.7, Hgb 8.5, platelet 102, AST/ALT trending down to 107/122.  Resolved Hospital Problem list   GI bleed  Assessment & Plan:  Acute hypoxic respiratory failure Acute on chronic diastolic heart failure Pulmonary edema Bilateral pleural effusions Respiratory status continues to improve. Tolerated BiPAP briefly overnight. Down to 2 L [place]. Comfortable appearing on exam today.  Had great UOP overnight of about 2 L. -Continue torsemide 20 mg daily -Supplemental O2 to keep O2 sat >92% -Strict I&O's, daily weight -PRN Guaifenesin for cough  Atrial fibrillation Patient remains in normal sinus.  Rate controlled on multiple agents.  Blood pressure has been stable with SBP in the 110s to 120s.  K+ 3.4, mag 1.8. -Cardiology following, recs below -Continue amiodarone to po 200 mg BID for 5 days, then 200 mg daily -Continue diltiazem 30 mg QID -Continue metoprolol 25 mg QID -Continue digoxin 0.0625 mg daily -Replete mag and K+ -Continue to hold Eliquis for now pending results of EGD -Trend and replete electrolytes  GI bleed Patient found to have large melanotic stool on 10/29.  No evidence of further GI bleed for the last 3 days.  Hemoglobin stable at 8.5.  Plan for EGD this afternoon under conscious sedation. -GI following, appreciate assistance -EGD later today -Continue IV PPI BID -Continue to hold Eliquis -Transfuse for Hgb < 7    Transaminitis Patient found to have elevation in LFTs yesterday with AST/ALT of 158/134. Trending down today after some diuresis.  Patient has no abd tenderness on exam.  Likely shock liver vs. Amiodarone. We will continue to monitor.  -Follow-up RUQ U/S -Trend LFTs -Avoid hepatotoxic meds if able  HTN Blood pressure has been stable with SBP in the 110s to 120s. -Continue metoprolol -CTM -MAP goal >65  HLD -Continue Lipitor 20 mg daily   Essential tremor Anxiety Per granddaughter, patient does not have a diagnosis of Parkinson's.  She does have essential tremor that has been stable. -Continue keppra -Continue gabapentin and duloxetine -CTM  Generalized deconditioning Goals of care PT recommended SNF placement.  Family reports they have secure private outpatient rehab for patient when she is ready for discharge home. -TOC following, appreciate recs -Palliative care following, appreciate recs  Best Practice (right click and "Reselect all SmartList Selections" daily)   Diet/type: full liquids  DVT prophylaxis: SCD GI prophylaxis: PPI Lines: N/A Foley:  N/A Code Status:  DNR Last date of multidisciplinary goals of care discussion [Updated family at bedside]  Labs   CBC: Recent Labs  Lab 09/23/22 1805 09/23/22 2300 09/24/22 0339 09/25/22 0535 09/26/22 0240  WBC 8.3 7.5 7.6 7.4 8.7  HGB 9.7* 9.0* 9.0* 8.6* 8.5*  HCT 27.4* 26.6* 26.9* 27.1* 26.8*  MCV 89.5 92.7 94.4 95.8 95.4  PLT 95* 104* 101* 106* 102*    Basic Metabolic Panel: Recent Labs  Lab 09/21/22 0253 09/22/22 0207 09/23/22 0642 09/24/22 0339 09/25/22 0535 09/26/22 0240  NA 139 136 140 138 139 139  K 3.9 4.1 3.8 4.0 3.9 3.4*  CL 94* 94* 95* 96* 98 96*  CO2 33* 35* 36* 33* 33* 33*  GLUCOSE 135* 118* 132* 137* 127* 129*  BUN 39* 54* 42* 34* 26* 21  CREATININE 0.90 0.80 0.67 0.72 0.69 0.77  CALCIUM 8.4* 8.0* 8.5* 8.6* 8.6* 8.2*  MG 2.0 1.9 2.2 2.0 2.1  --    GFR: Estimated Creatinine  Clearance: 42.4 mL/min (by C-G formula based on SCr of 0.77 mg/dL). Recent Labs  Lab 09/23/22 2300 09/24/22 0339 09/25/22 0535 09/26/22 0240  WBC 7.5 7.6 7.4 8.7    Liver Function Tests: Recent Labs  Lab 09/21/22 0253 09/22/22 0207 09/24/22 0339 09/25/22 0535 09/26/22 0240  AST 41 67* 71* 158* 107*  ALT 51* 62* 66* 134* 122*  ALKPHOS 105 94 57 68 75  BILITOT 0.7 0.4 0.4 0.4 0.6  PROT 6.2* 5.6* 5.6* 5.3* 5.3*  ALBUMIN 2.6* 2.4* 3.0* 2.9* 2.7*   No results for input(s): "LIPASE", "AMYLASE" in the last 168 hours. No results for input(s): "AMMONIA" in the last 168 hours.  ABG    Component Value Date/Time   PHART 7.43 09/20/2022 0833   PCO2ART 61 (H) 09/20/2022 0833   PO2ART 204 (H) 09/20/2022 0833   HCO3 41.2 (H) 09/20/2022 1554   TCO2 32 12/08/2010 2038   O2SAT 90.2 09/20/2022 1554     Coagulation Profile: No results for input(s): "INR", "PROTIME" in the last 168 hours.  Cardiac Enzymes: No results for input(s): "CKTOTAL", "CKMB", "CKMBINDEX", "TROPONINI" in the last 168 hours.  HbA1C: Hemoglobin A1C  Date/Time Value Ref Range Status  11/30/2020 12:09 PM 5.4 4.0 - 5.6 % Final   HbA1c, POC (controlled diabetic range)  Date/Time Value Ref Range Status  06/14/2022 12:15 PM 5.6 0.0 - 7.0 % Final  12/17/2018 03:56 PM 5.3 0.0 - 7.0 % Final    CBG: Recent Labs  Lab 09/20/22 1145  GLUCAP 126*    Review of Systems:   As in HPI  Past Medical History:  She,  has a past medical history of Anxiety, Diabetes ([place]) (10/27/2013), DVT (deep venous thrombosis) ([place]), Essential tremor (10/27/2013), High cholesterol (1999), Hypertension, Ischemic stroke ([place]) (07/18/2014), Obesity, unspecified (10/27/2013), Pneumonia, and Unspecified hereditary and idiopathic peripheral neuropathy (10/27/2013).   Surgical History:   Past Surgical History:  Procedure Laterality Date   CATARACT EXTRACTION Bilateral    FOOT SURGERY     IR KYPHO LUMBAR INC FX REDUCE BONE BX UNI/BIL  CANNULATION INC/IMAGING  06/15/2019     Social History:   reports that she has never smoked. She has never used smokeless tobacco. She reports that she does not drink alcohol and does not use drugs.   Family History:  Her family history includes Coronary artery disease in an other family member; Heart Problems in her brother and sister; Stroke in her mother. There is no history of Tremor.   Allergies Allergies  Allergen Reactions   Tape Rash    Please do not use Plastic Tape     Home Medications  Prior to Admission medications   Medication Sig Start Date End Date Taking? Authorizing Provider  acetaminophen (TYLENOL) 325 MG tablet Take 2 tablets (650 mg total) by mouth every 6 (six) hours as needed. Patient taking differently: Take 325 mg by mouth every 6 (six) hours as needed for moderate pain. 06/01/17  Yes Varney Biles, MD  ALPRAZolam (XANAX) 0.25 MG tablet Take 0.25 mg by mouth 2 (two) times daily as needed for anxiety.    Yes [provider]  amLODipine (NORVASC) 2.5 MG tablet Take 2.5 mg by mouth daily. 05/29/17  Yes [provider]  atorvastatin (LIPITOR) 20 MG tablet TAKE 1 TABLET BY MOUTH EVERY DAY IN THE EVENING Patient taking differently: Take 20 mg by mouth every evening. 05/10/22  Yes Zola Button, MD  CALCIUM PO Take 1 tablet by mouth daily.   Yes [provider]  Cholecalciferol (VITAMIN D PO) Take 1 tablet by mouth daily.   Yes [provider]  clopidogrel (PLAVIX) 75 MG tablet TAKE 1 TABLET BY MOUTH EVERY DAY Patient taking differently: Take 75 mg by mouth once. 06/17/22  Yes Zola Button, MD  diclofenac Sodium (VOLTAREN) 1 % GEL Apply 2 g topically 4 (four) times daily as needed. 07/26/22  Yes Zola Button, MD  DULoxetine (CYMBALTA) 20 MG capsule TAKE 1 CAPSULE BY MOUTH EVERY DAY Patient taking differently: Take 20 mg by mouth daily. 01/07/22  Yes Jessy Oto, MD  furosemide (LASIX) 20 MG tablet Take 20 mg by mouth daily. 06/08/18  Yes  [provider]  gabapentin (NEURONTIN) 100 MG capsule Take 1 capsule (100 mg total) by mouth at bedtime. Patient taking differently: Take 100 mg by mouth in the morning and at bedtime. 03/05/22  Yes Star Age, MD  isosorbide mononitrate (IMDUR) 30 MG 24 hr tablet Take 15 mg by mouth daily. 08/09/22  Yes [provider]  levETIRAcetam (KEPPRA) 500 MG tablet Take 1 tablet (500 mg total) by mouth 2 (two) times daily. 03/05/22  Yes Athar,  Saima, MD  lidocaine (LIDODERM) 5 % Place 1 patch onto the skin daily. Remove & Discard patch within 12 hours or as directed by MD 05/10/22  Yes Zola Button, MD  PAZEO 0.7 % SOLN Place 1 drop into both eyes at bedtime.  07/24/18  Yes [provider]  primidone (MYSOLINE) 250 MG tablet Take 1 tablet (250 mg total) by mouth 2 (two) times daily. 03/05/22  Yes Star Age, MD  RESTASIS 0.05 % ophthalmic emulsion Place 1 drop into both eyes 2 (two) times daily.  09/29/14  Yes [provider]  Ages test strip USE AS INSTRUCTED TO TEST ONCE A DAY DX CODE:E11.9 11/22/19   Benay Pike, MD  Accu-Chek Softclix Lancets lancets Use as instructed 09/23/20   Benay Pike, MD     Critical care time: 69

## 2022-09-26 NOTE — Op Note (Signed)
[facility] Patient Name: [name] [name] Procedure Date : 09/26/2022 MRN: [id number] Attending MD: [name] [name] , MD, [id number] Date of Birth: [birth year] CSN: [id number] Age: 88 Admit Type: Inpatient Procedure:                Upper GI endoscopy Indications:              H/O Hematochezia on adm. A Fib but now in NSR after                            cardioversion. Need for AC. Providers:                [name] Denmark, MD, [name] Harman, RN, [name] Cara, RN, [name] [name], Technician Referring MD:              Medicines:                Propofol per Pulm. Appreciate their assistance with                            bedside EGD Complications:            No immediate complications. Estimated Blood Loss:     Estimated blood loss: none. Procedure:                Pre-Anesthesia Assessment:                           - Prior to the procedure, a History and Physical                            was performed, and patient medications and                            allergies were reviewed. The patient's tolerance of                            previous anesthesia was also reviewed. The risks                            and benefits of the procedure and the sedation                            options and risks were discussed with the patient.                            All questions were answered, and informed consent                            was obtained. Prior Anticoagulants: The patient has                            taken no anticoagulant or antiplatelet agents. ASA  Grade Assessment: III - A patient with severe                            systemic disease. After reviewing the risks and                            benefits, the patient was deemed in satisfactory                            condition to undergo the procedure.                           After obtaining informed consent, the endoscope was                             passed under direct vision. Throughout the                            procedure, the patient's blood pressure, pulse, and                            oxygen saturations were monitored continuously. The                            [id number] Olympus endoscope was introduced                            through the mouth, and advanced to the third part                            of duodenum. The upper GI endoscopy was                            accomplished without difficulty. The patient                            tolerated the procedure well. Scope In: Scope Out: Findings:      The examined esophagus was normal with well-defined Z-line at 36 cm.      One non-bleeding cratered gastric ulcer with a clean ulcer base but       raised margins (Forrest Class III) was found in the gastric body. The       lesion was 10 mm in largest dimension. Multiple biopsies were taken with       a cold forceps for histology.      The examined duodenum was normal.      The exam was otherwise without abnormality. Impression:               - Non-bleeding gastric ulcer with a clean ulcer                            base (Forrest Class III). Biopsied.                           - Otherwise normal EGD. Recommendation:           -  Resume previous diet.                           - Recommend Protonix 40 mg p.o. twice daily x 12                            weeks, then QD                           - Await pathology results.                           - Given the above findings, the risk of UGI                            bleeding would be higher with AC. If possible and                            if it is nonemergent (as my understanding is), can                            hold off AC x 2 weeks.                           - The findings and recommendations were discussed                            with the patient's family. There is no right                            answer. If [name] is emergently needed, can  always                            start as clinically indicated. If bleeds, can                            reperform EGD with control of bleeding. Also                            ideally, would recommend repeating EGD in 12 weeks                            to ensure healing of the ulcer. That would be up to                            the family to decide.                           - Pl let us know if we could be of any assistance.                            Will stand by                           -  FU GI in 4-6 weeks. Procedure Code(s):        --- Professional ---                           [phone], Esophagogastroduodenoscopy, flexible,                            transoral; with biopsy, single or multiple Diagnosis Code(s):        --- Professional ---                           K25.9, Gastric ulcer, unspecified as acute or                            chronic, without hemorrhage or perforation                           K92.1, Melena (includes Hematochezia) CPT copyright 2022 [employer] Association. All rights reserved. The codes documented in this report are preliminary and upon coder review may  be revised to meet current compliance requirements. [name] Denmark, MD 09/26/2022 3:26:02 PM This report has been signed electronically. Number of Addenda: 0

## 2022-09-26 NOTE — Progress Notes (Signed)
Removed patient from QHS BIPAP per her request. Vital signs stable at this time.

## 2022-09-27 ENCOUNTER — Inpatient Hospital Stay (HOSPITAL_COMMUNITY): Payer: Medicare Other

## 2022-09-27 ENCOUNTER — Ambulatory Visit: Payer: Medicare Other | Admitting: Family Medicine

## 2022-09-27 DIAGNOSIS — M7989 Other specified soft tissue disorders: Secondary | ICD-10-CM

## 2022-09-27 DIAGNOSIS — I4891 Unspecified atrial fibrillation: Secondary | ICD-10-CM | POA: Diagnosis not present

## 2022-09-27 LAB — COMPREHENSIVE METABOLIC PANEL
ALT: 126 U/L — ABNORMAL HIGH (ref 0–44)
AST: 111 U/L — ABNORMAL HIGH (ref 15–41)
Albumin: 2.7 g/dL — ABNORMAL LOW (ref 3.5–5.0)
Alkaline Phosphatase: 82 U/L (ref 38–126)
Anion gap: 8 (ref 5–15)
BUN: 18 mg/dL (ref 8–23)
CO2: 36 mmol/L — ABNORMAL HIGH (ref 22–32)
Calcium: 8.4 mg/dL — ABNORMAL LOW (ref 8.9–10.3)
Chloride: 95 mmol/L — ABNORMAL LOW (ref 98–111)
Creatinine, Ser: 0.74 mg/dL (ref 0.44–1.00)
GFR, Estimated: >60 mL/min (ref >60)
Glucose, Bld: 123 mg/dL — ABNORMAL HIGH (ref 70–99)
Potassium: 4.2 mmol/L (ref 3.5–5.1)
Sodium: 139 mmol/L (ref 135–145)
Total Bilirubin: 0.6 mg/dL (ref 0.3–1.2)
Total Protein: 5.4 g/dL — ABNORMAL LOW (ref 6.5–8.1)

## 2022-09-27 LAB — CBC
HCT: 26.3 % — ABNORMAL LOW (ref 36.0–46.0)
Hemoglobin: 8.6 g/dL — ABNORMAL LOW (ref 12.0–15.0)
MCH: 31.2 pg (ref 26.0–34.0)
MCHC: 32.7 g/dL (ref 30.0–36.0)
MCV: 95.3 fL (ref 80.0–100.0)
Platelets: 122 10*3/uL — ABNORMAL LOW (ref 150–400)
RBC: 2.76 MIL/uL — ABNORMAL LOW (ref 3.87–5.11)
RDW: 15.3 % (ref 11.5–15.5)
WBC: 8.4 10*3/uL (ref 4.0–10.5)
nRBC: 0 % (ref 0.0–0.2)

## 2022-09-27 LAB — MAGNESIUM: Magnesium: 2.2 mg/dL (ref 1.7–2.4)

## 2022-09-27 LAB — SURGICAL PATHOLOGY

## 2022-09-27 MED ORDER — METOPROLOL TARTRATE 25 MG PO TABS
25.0000 mg | ORAL_TABLET | Freq: Three times a day (TID) | ORAL | Status: DC
  Administered 2022-09-27 – 2022-09-28 (×3): 25 mg via ORAL
  Filled 2022-09-27 (×4): qty 1

## 2022-09-27 MED ORDER — PANTOPRAZOLE SODIUM 40 MG PO TBEC
40.0000 mg | DELAYED_RELEASE_TABLET | Freq: Two times a day (BID) | ORAL | Status: DC
  Administered 2022-09-27 – 2022-10-04 (×14): 40 mg via ORAL
  Filled 2022-09-27 (×14): qty 1

## 2022-09-27 NOTE — TOC Progression Note (Addendum)
Transition of Care [facility]) - Progression Note    Patient Details  Name: [name] [name] MRN: [id number] Date of Birth: 1930/10/15  Transition of Care [facility]) CM/SW Contact  Joanne Chars, LCSW Phone Number: 09/27/2022, 10:21 AM  Clinical Narrative:   CSW spoke with [name]. Loree Fee is off.  He will review referral to see if they can offer.   1115: [name] cannot offer bed.  Expected Discharge Plan: [place] Barriers to Discharge: Continued Medical Work up  Expected Discharge Plan and Services Expected Discharge Plan: [place] arrangements for the past 2 months: Single Family Home                                       Social Determinants of Health (SDOH) Interventions    Readmission Risk Interventions     No data to display

## 2022-09-27 NOTE — Progress Notes (Signed)
Occupational Therapy Treatment Patient Details Name: [name] [name] MRN: [id number] DOB: November 21, 1930 Today's Date: 09/27/2022   History of present illness Pt is an 88 y.o. female admitted 09/16/22 with worsening BLE weakness, SOB. Pt with new onset AFib with RVR as well as new CHF and GIB. Bilateral pleural effusions R>L found 10/29. S/P TEE/DCCV 10/31. Tx to ICU due to GIB. PMH includes HTN, DVT, DM, essential tremor, stroke (2015), anxiety.   OT comments  Pt progressing toward goals. Son present and interpreting throughout. VSS on 2L. Pt easily fatigues however works hard with therapy. Max A +2 with use of Stedy for transfers - would do better with [name] [name] due to inability to completely stand upright. Encouraged family to complete bed level exercises with [name] [name]. Pt/family very appreciative. Continue to recommend rehab at [facility]. Acute OT to follow.    Recommendations for follow up therapy are one component of a multi-disciplinary discharge planning process, led by the attending physician.  Recommendations may be updated based on patient status, additional functional criteria and insurance authorization.    Follow Up Recommendations  Skilled nursing-short term rehab (<3 hours/day)    Assistance Recommended at Discharge Frequent or constant Supervision/Assistance  Patient can return home with the following  Assistance with cooking/housework;Direct supervision/assist for financial management;Assistance with feeding;Direct supervision/assist for medications management;Assist for transportation;Help with stairs or ramp for entrance;Two people to help with walking and/or transfers;Two people to help with bathing/dressing/bathroom   Equipment Recommendations  Other (comment)    Recommendations for Other Services      Precautions / Restrictions Precautions Precautions: Fall;Other (comment) Precaution Comments: Watch HR and O2 Restrictions Weight Bearing Restrictions: No        Mobility Bed Mobility Overal bed mobility: Needs Assistance Bed Mobility: Sit to Supine       Sit to supine: Max assist, +2 for physical assistance   General bed mobility comments: Assist of 2 to return to supine with LEs and trunk as well as for repositioning.    Transfers Overall transfer level: Needs assistance Equipment used: Ambulation equipment used Transfers: Sit to/from Stand Sit to Stand: Max assist, +2 physical assistance           General transfer comment: Max of 2-3 to power to standing using belt and pad with assist for hip extension and 3rd person to adjust flaps on stedy seat, Stood from recliner x1, from stedy seat x1. Total A to transfer back to bed using stedy.  Pt able to reciprocally scoot to end of chair with cues and some assist as well as scoot back onto bed. Transfer via Lift Equipment: Stedy   Balance Overall balance assessment: Needs assistance Sitting-balance support: Feet supported, No upper extremity supported Sitting balance-Leahy Scale: Fair Sitting balance - Comments: Close Min guard-Min A for sitting balance withuot back support.   Standing balance support: During functional activity Standing balance-Leahy Scale: Zero Standing balance comment: ASsist of 2 to stand using stedy                           ADL either performed or assessed with clinical judgement   ADL Overall ADL's : Needs assistance/impaired Eating/Feeding: Minimal assistance;Sitting Eating/Feeding Details (indicate cue type and reason): tremor interferring with ability to self feed - may benefit fomr tubing and lidded cup Grooming: Minimal assistance   Upper Body Bathing: Moderate assistance   Lower Body Bathing: Maximal assistance  Toileting- Clothing Manipulation and Hygiene: Maximal assistance              Extremity/Trunk Assessment Upper Extremity Assessment Upper Extremity Assessment: Generalized weakness   Lower Extremity  Assessment Lower Extremity Assessment: Defer to PT evaluation        Vision   Vision Assessment?: No apparent visual deficits   Perception     Praxis      Cognition Arousal/Alertness: Awake/alert Behavior During Therapy: WFL for tasks assessed/performed Overall Cognitive Status: Difficult to assess                                 General Comments: Pt follows some basic commands in english, otherwise son interprets. Son staes she is at her baseline        Exercises Exercises: General Upper Extremity General Exercises - Upper Extremity Shoulder Flexion: AROM, 10 reps, Both, Supine Elbow Flexion: AROM, Both, 10 reps Elbow Extension: AROM, Both, 10 reps, Supine    Shoulder Instructions       General Comments overall VSS with  exception of increased HR with mobility however tremors affecting reliability    Pertinent Vitals/ Pain       Pain Assessment Pain Assessment: Faces Faces Pain Scale: Hurts a little bit Pain Descriptors / Indicators: Discomfort Pain Intervention(s):  (generalized)  Home Living                                          Prior Functioning/Environment              Frequency  Min 2X/week        Progress Toward Goals  OT Goals(current goals can now be found in the care plan section)  Progress towards OT goals: Progressing toward goals  Acute Rehab OT Goals Patient Stated Goal: to go to rehab OT Goal Formulation: With patient/family Time For Goal Achievement: 10/09/22 Potential to Achieve Goals: Good ADL Goals Pt Will Perform Grooming: with min guard assist;sitting Pt Will Perform Upper Body Dressing: with min assist;sitting Pt Will Perform Lower Body Dressing: with max assist;sitting/lateral leans;with adaptive equipment Pt Will Transfer to Toilet: with max assist;with +2 assist;bedside commode Additional ADL Goal #1: Pt will complete bed mobility with min assist and maintain sitting balance  dynamically with min assist for 5 minutes as precursor to ADLs.  Plan Discharge plan remains appropriate;Frequency remains appropriate    Co-evaluation    PT/OT/SLP Co-Evaluation/Treatment: Yes Reason for Co-Treatment: Complexity of the patient's impairments (multi-system involvement);To address functional/ADL transfers;For patient/therapist safety PT goals addressed during session: Mobility/safety with mobility;Strengthening/ROM;Balance        AM-PAC OT "6 Clicks" Daily Activity     Outcome Measure   Help from another person eating meals?: A Lot Help from another person taking care of personal grooming?: A Lot Help from another person toileting, which includes using toliet, bedpan, or urinal?: A Lot Help from another person bathing (including washing, rinsing, drying)?: A Lot Help from another person to put on and taking off regular upper body clothing?: A Lot Help from another person to put on and taking off regular lower body clothing?: Total 6 Click Score: 11    End of Session Equipment Utilized During Treatment: Gait belt;Oxygen (2L)  OT Visit Diagnosis: Unsteadiness on feet (R26.81);Other abnormalities of gait and mobility (R26.89);Muscle weakness (generalized) (M62.81);Pain Pain -  part of body:  (generalized)   Activity Tolerance Patient tolerated treatment well   Patient Left in bed;with call bell/phone within reach;with bed alarm set;with family/visitor present   Nurse Communication Mobility status;Need for lift equipment        Time: 1110-1135 OT Time Calculation (min): 25 min  Charges: OT General Charges $OT Visit: 1 Visit OT Treatments $Self Care/Home Management : 8-22 mins  Maurie Boettcher, OT/L   Acute OT Clinical Specialist [place] Pager [phone] Office [phone]   [facility] 09/27/2022, 2:20 PM

## 2022-09-27 NOTE — Progress Notes (Signed)
Right upper extremity venous duplex has been completed. Preliminary results can be found in CV Proc through chart review.  Results were given to Dr. Shearon Stalls.  09/27/22 5:08 PM [name] [name] RVT

## 2022-09-27 NOTE — Progress Notes (Signed)
   Spoke with Dr. Coy Saunas, patient recently had EGD and GI wants to continue to hold eliquis for now until pathology results. She was cardioverted a few days ago and now back in normal sinus. GI and cardiology are continuing to follow. From a respiratory standpoint, she is down to 2L. PT recommended SNF. Family is still in the midst of making a decision regarding dispo. Patient is now stable for floor status, FPTS will be resuming care of this patient at [facility] on 11/4. Appreciate CCM's care.    Donney Dice, DO 09/27/2022, 3:17 PM PGY-3, [place] [facility] Service pager [phone]

## 2022-09-27 NOTE — Progress Notes (Signed)
Physical Therapy Treatment Patient Details Name: [name] [name] MRN: [id number] DOB: [birth year] Today's Date: 09/27/2022   History of Present Illness Pt is an 88 y.o. female admitted 09/16/22 with worsening BLE weakness, SOB. Pt with new onset AFib with RVR as well as new CHF and GIB. Bilateral pleural effusions R>L found 10/29. S/P TEE/DCCV 10/31. Tx to ICU due to GIB. PMH includes HTN, DVT, DM, essential tremor, stroke (2015), anxiety.    PT Comments    Patient progressing well towards PT goals. Session focused on standing trials using stedy and there ex. Requires Max A of 2 to stand from chair with cues for hip extension and upright. Pt able to initiate the transfer as well as reciprocally scoot in chair and when on bed with cues and Min A. Tolerated there ex of UEs/LEs in seated and supine position. Also noted to still have full body tremors which is baseline per son. Continues to be appropriate for SNF. Will follow.    Recommendations for follow up therapy are one component of a multi-disciplinary discharge planning process, led by the attending physician.  Recommendations may be updated based on patient status, additional functional criteria and insurance authorization.  Follow Up Recommendations  Skilled nursing-short term rehab (<3 hours/day) Can patient physically be transported by private vehicle: No   Assistance Recommended at Discharge Frequent or constant Supervision/Assistance  Patient can return home with the following Two people to help with walking and/or transfers;Two people to help with bathing/dressing/bathroom;Direct supervision/assist for medications management;Assistance with feeding;Assist for transportation;Assistance with cooking/housework;Direct supervision/assist for financial management   Equipment Recommendations  Hospital bed;Wheelchair (measurements PT);BSC/3in1;Other (comment)    Recommendations for Other Services       Precautions / Restrictions  Precautions Precautions: Fall;Other (comment) Precaution Comments: Watch HR and O2 Restrictions Weight Bearing Restrictions: No     Mobility  Bed Mobility Overal bed mobility: Needs Assistance Bed Mobility: Sit to Supine       Sit to supine: Max assist, +2 for physical assistance   General bed mobility comments: Assist of 2 to return to supine with LEs and trunk as well as for repositioning.    Transfers Overall transfer level: Needs assistance Equipment used: Ambulation equipment used Transfers: Sit to/from Stand Sit to Stand: Max assist, +2 physical assistance           General transfer comment: Max of 2-3 to power to standing using belt and pad with assist for hip extension and 3rd person to adjust flaps on stedy seat, Stood from recliner x1, from stedy seat x1. Total A to transfer back to bed using stedy.  Pt able to reciprocally scoot to end of chair with cues and some assist as well as scoot back onto bed. Transfer via Lift Equipment: Stedy  Ambulation/Gait               General Gait Details: Deferred   Stairs             Wheelchair Mobility    Modified Rankin (Stroke Patients Only)       Balance Overall balance assessment: Needs assistance Sitting-balance support: Feet supported, No upper extremity supported Sitting balance-Leahy Scale: Fair Sitting balance - Comments: Close Min guard-Min A for sitting balance withuot back support.   Standing balance support: During functional activity Standing balance-Leahy Scale: Zero Standing balance comment: ASsist of 2 to stand using stedy  Cognition Arousal/Alertness: Awake/alert Behavior During Therapy: WFL for tasks assessed/performed Overall Cognitive Status: Difficult to assess                                 General Comments: Pt follows some basic commands in english, otherwise son interprets.        Exercises General Exercises - Lower  Extremity Ankle Circles/Pumps: AROM, Both, 10 reps, Seated Long Arc Quad: AROM, Both, 20 reps, Seated Heel Slides: AROM, Both, 10 reps, Supine Hip ABduction/ADduction: AROM, Both, AAROM, 5 reps, Supine Hip Flexion/Marching: AROM, Both, 20 reps, Seated    General Comments General comments (skin integrity, edema, etc.): HR up to 120s bpm, whole body tremors still present which is baseline per son.      Pertinent Vitals/Pain Pain Assessment Pain Assessment: Faces Faces Pain Scale: No hurt    Home Living                          Prior Function            PT Goals (current goals can now be found in the care plan section) Progress towards PT goals: Progressing toward goals    Frequency    Min 3X/week      PT Plan Current plan remains appropriate    Co-evaluation PT/OT/SLP Co-Evaluation/Treatment: Yes Reason for Co-Treatment: Complexity of the patient's impairments (multi-system involvement);For patient/therapist safety;To address functional/ADL transfers PT goals addressed during session: Mobility/safety with mobility;Strengthening/ROM;Balance        AM-PAC PT "6 Clicks" Mobility   Outcome Measure  Help needed turning from your back to your side while in a flat bed without using bedrails?: A Lot Help needed moving from lying on your back to sitting on the side of a flat bed without using bedrails?: A Lot Help needed moving to and from a bed to a chair (including a wheelchair)?: Total Help needed standing up from a chair using your arms (e.g., wheelchair or bedside chair)?: Total Help needed to walk in hospital room?: Total Help needed climbing 3-5 steps with a railing? : Total 6 Click Score: 8    End of Session Equipment Utilized During Treatment: Gait belt;Oxygen Activity Tolerance: Patient tolerated treatment well;Patient limited by fatigue Patient left: in bed;with call bell/phone within reach;with bed alarm set;with family/visitor present Nurse  Communication: Mobility status;Need for lift equipment PT Visit Diagnosis: Unsteadiness on feet (R26.81);Muscle weakness (generalized) (M62.81);Difficulty in walking, not elsewhere classified (R26.2)     Time: 1110-1135 PT Time Calculation (min) (ACUTE ONLY): 25 min  Charges:  $Therapeutic Activity: 8-22 mins                     Marisa Severin, PT, DPT Acute Rehabilitation Services Secure chat preferred Office [phone]      [name] [name] 09/27/2022, 12:42 PM

## 2022-09-27 NOTE — Progress Notes (Signed)
Patient resting well.  VSS.  BiPAP not indicated at this time.

## 2022-09-27 NOTE — Consult Note (Signed)
Ref: [name], MD   Subjective:  Awake. Sitting up in chair. VS stable. Monitor shows sinus rhythm. Hypokalemia corrected. Creatinine and Hgb are stable.  Objective:  Vital Signs in the last 24 hours: Temp:  [97.6 F (36.4 C)-98.9 F (37.2 C)] 98 F (36.7 C) (11/03 0301) Pulse Rate:  [57-97] 69 (11/03 0800) Cardiac Rhythm: Normal sinus rhythm (11/03 0800) Resp:  [18-33] 29 (11/03 0800) BP: (104-131)/(37-71) 128/55 (11/03 0800) SpO2:  [93 %-100 %] 93 % (11/03 0800) FiO2 (%):  [35 %] 35 % (11/03 0000) Weight:  [70.7 kg] 70.7 kg (11/03 0500)  Physical Exam: BP Readings from Last 1 Encounters:  09/27/22 (!) 128/55     Wt Readings from Last 1 Encounters:  09/27/22 70.7 kg    Weight change: -1.8 kg Body mass index is 30.44 kg/m. HEENT: [place]/AT, Eyes-Brown, Conjunctiva-Pink, Sclera-Non-icteric Neck: No JVD, No bruit, Trachea midline. Lungs:  Clearing, Bilateral. Cardiac:  Regular rhythm, normal S1 and S2, no S3. II/VI systolic murmur. Abdomen:  Soft, non-tender. BS present. Extremities:  No edema present. No cyanosis. No clubbing. CNS: AxOx3, Cranial nerves grossly intact, moves all 4 extremities.  Skin: Warm and dry.   Intake/Output from previous day: 11/02 0701 - 11/03 0700 In: 1.1 [I.V.:1.1] Out: 2050 [Urine:2050]    Lab Results: BMET    Component Value Date/Time   NA 139 09/27/2022 0357   NA 139 09/26/2022 0240   NA 139 09/25/2022 0535   NA 141 06/14/2022 1505   NA 141 04/26/2020 1708   NA 141 06/22/2018 1448   K 4.2 09/27/2022 0357   K 3.4 (L) 09/26/2022 0240   K 3.9 09/25/2022 0535   CL 95 (L) 09/27/2022 0357   CL 96 (L) 09/26/2022 0240   CL 98 09/25/2022 0535   CO2 36 (H) 09/27/2022 0357   CO2 33 (H) 09/26/2022 0240   CO2 33 (H) 09/25/2022 0535   GLUCOSE 123 (H) 09/27/2022 0357   GLUCOSE 129 (H) 09/26/2022 0240   GLUCOSE 127 (H) 09/25/2022 0535   BUN 18 09/27/2022 0357   BUN 21 09/26/2022 0240   BUN 26 (H) 09/25/2022 0535   BUN 17 06/14/2022  1505   BUN 19 04/26/2020 1708   BUN 14 06/22/2018 1448   CREATININE 0.74 09/27/2022 0357   CREATININE 0.77 09/26/2022 0240   CREATININE 0.69 09/25/2022 0535   CALCIUM 8.4 (L) 09/27/2022 0357   CALCIUM 8.2 (L) 09/26/2022 0240   CALCIUM 8.6 (L) 09/25/2022 0535   GFRNONAA >60 09/27/2022 0357   GFRNONAA >60 09/26/2022 0240   GFRNONAA >60 09/25/2022 0535   GFRAA 69 04/26/2020 1708   GFRAA >60 06/15/2019 0841   GFRAA >60 11/13/2018 1941   CBC    Component Value Date/Time   WBC 8.4 09/27/2022 0357   RBC 2.76 (L) 09/27/2022 0357   HGB 8.6 (L) 09/27/2022 0357   HGB 15.2 04/26/2020 1708   HCT 26.3 (L) 09/27/2022 0357   HCT 46.5 04/26/2020 1708   PLT 122 (L) 09/27/2022 0357   PLT 102 (L) 04/26/2020 1708   MCV 95.3 09/27/2022 0357   MCV 94 04/26/2020 1708   MCH 31.2 09/27/2022 0357   MCHC 32.7 09/27/2022 0357   RDW 15.3 09/27/2022 0357   RDW 12.8 04/26/2020 1708   LYMPHSABS 1.1 09/17/2022 0207   LYMPHSABS 1.6 04/26/2020 1708   MONOABS 0.8 09/17/2022 0207   EOSABS 0.0 09/17/2022 0207   EOSABS 0.0 04/26/2020 1708   BASOSABS 0.1 09/17/2022 0207   BASOSABS 0.0 04/26/2020 1708  HEPATIC Function Panel Recent Labs    09/25/22 0535 09/26/22 0240 09/27/22 0357  PROT 5.3* 5.3* 5.4*  ALBUMIN 2.9* 2.7* 2.7*  AST 158* 107* 111*  ALT 134* 122* 126*  ALKPHOS 68 75 82   HEMOGLOBIN A1C Lab Results  Component Value Date   MPG 126 (H) 07/18/2014   CARDIAC ENZYMES Lab Results  Component Value Date   TROPONINI <0.30 03/24/2012   BNP No results for input(s): "PROBNP" in the last 8760 hours. TSH Recent Labs    09/16/22 1929  TSH 1.792   CHOLESTEROL Recent Labs    06/14/22 1505 09/17/22 0207  CHOL 180 161    Scheduled Meds:  amiodarone  200 mg Oral BID   Followed by   Derrill Memo ON 09/30/2022] amiodarone  200 mg Oral Daily   atorvastatin  20 mg Oral QPM   Chlorhexidine Gluconate Cloth  6 each Topical Daily   cycloSPORINE  1 drop Both Eyes BID   digoxin  0.0625 mg Oral  Daily   diltiazem  30 mg Oral QID   DULoxetine  20 mg Oral Daily   fentaNYL (SUBLIMAZE) injection  100 mcg Intravenous Once   gabapentin  100 mg Oral QHS   lactose free nutrition  237 mL Oral TID WC   levETIRAcetam  500 mg Oral BID   metoprolol tartrate  25 mg Oral QID   mouth rinse  15 mL Mouth Rinse 4 times per day   pantoprazole  40 mg Oral BID   potassium chloride  10 mEq Oral BID   torsemide  20 mg Oral Daily   Continuous Infusions:  sodium chloride Stopped (09/26/22 1457)   PRN Meds:.diclofenac Sodium, guaiFENesin, mouth rinse, polyethylene glycol, senna, simethicone  Assessment/Plan: Acute on chronic diastolic left heart failure Acute respiratory failure with hypoxia Paroxysmal atrial fibrillation Recent GI bleed Gastric ulcer Anemia of blood loss HTN HLD Old stroke Obesity Arthritis Parkinson's disease  Plan: Increase activity as tolerated. Hold anticoagulation for 2 weeks and continue pantoprazole bid per GI.   LOS: 10 days   Time spent including chart review, lab review, examination, discussion with patient/Nurse/Family : 30 min   Dixie Dials  MD  09/27/2022, 9:13 AM

## 2022-09-27 NOTE — Progress Notes (Signed)
NAME:  [name] [name], MRN:  [id number], DOB:  1930-11-16, LOS: 28 ADMISSION DATE:  09/16/2022, CONSULTATION DATE:  10/27 REFERRING MD:  Cardiology, CHIEF COMPLAINT:  Afib, Acute respiratory failure   History of Present Illness:  88 year old female with known atrial fibrillation rapid ventricular response who is scheduled for cardioversion but subsequently has developed acute respiratory distress requiring noninvasive imaging treated with aggressive diuresis.  She will need to be transferred to intensive care unit for closer evaluation.  She may need to be intubated for the cardioversion.  Have discussed this with cardiology who agrees to give the Lasix chance to work she may not require intubation.  Pertinent  Medical History   Past Medical History:  Diagnosis Date   Anxiety    Diabetes ([place]) 10/27/2013   DVT (deep venous thrombosis) ([place])    Essential tremor 10/27/2013   High cholesterol 1999   Hypertension    Ischemic stroke ([place]) 07/18/2014   Obesity, unspecified 10/27/2013   Pneumonia    Unspecified hereditary and idiopathic peripheral neuropathy 10/27/2013   Significant Hospital Events: Including procedures, antibiotic start and stop dates in addition to other pertinent events   10/27 Admitted to ICU 10/29 CT head no acute intracranial abnormalities 10/29 chest x-ray moderate bilateral pleural effusion, R>L 10/31: TEE/DCCV under conscious sedation  Interim History / Subjective:  No acute events overnight Remained on 2 L [place] Alert and awake this morning. Denies any abdominal pain, no acute concerns Nighttime metoprolol held due to mild bradycardia  Objective   Blood pressure (!) 119/49, pulse 61, temperature 98 F (36.7 C), temperature source Axillary, resp. rate (!) 31, height 5' (1.524 m), weight 70.7 kg, SpO2 97 %.    FiO2 (%):  [35 %] 35 %   Intake/Output Summary (Last 24 hours) at 09/27/2022 0655 Last data filed at 09/27/2022 0400 Gross per 24 hour  Intake 1.09 ml   Output 2050 ml  Net -2048.91 ml   Filed Weights   09/23/22 0343 09/26/22 0500 09/27/22 0500  Weight: 71.9 kg 72.5 kg 70.7 kg   Examination: General: Acutely ill elderly woman comfortable in bed.  NAD. HENT: [place]/AT. Moist mucous membrane. Lungs: On 2 LNC.  Lungs CTAB.  No WOB.  No wheezing or rales. Cardiovascular: Regular rate and rhythm.  S1, S2, No m/r/g.  Abdomen: Soft.  NT/ND.  Normal bowel sounds. Extremities: Warm and well perfused.  No LE edema. Neuro: Alert and awake.  Moving all extremities.  Mag 2.2, K+ 4.2, bicarb 36, creatinine 0.74 WBC 8.4, Hgb 8.6, platelet 122  CXR with stable bilateral pleural effusions  Resolved Hospital Problem list   GI bleed  Assessment & Plan:  Acute hypoxic respiratory failure Acute on chronic diastolic heart failure Pulmonary edema Bilateral pleural effusions Remains comfortable on 2 L [place] with O2 sats above 93%.  No significant change in pleural effusion from repeat chest x-ray today.  She had 2 L of UOP with p.o. diuresing. -Continue torsemide 20 mg daily -Supplemental O2 to keep O2 sat >92% -Strict I&O's, daily weight -PRN Guaifenesin for cough -Transfer to telemetry bed  Atrial fibrillation Patient continues to be in normal sinus with HR in the 60s to 70s.  Nighttime dose of metoprolol held due to mild bradycardia.  Patient will probably require decreased dose of her metoprolol to decrease further nodal blockade.  Magnesium and potassium are within normal limits. -Cardiology following, appreciate recs. -Continue amiodarone to po 200 mg BID for 5 days, then 200 mg daily -Continue diltiazem  30 mg QID -Decrease metoprolol 25 mg from QID to TID -Continue digoxin 0.0625 mg daily -Holding Eliquis for now until further discussions with GI -Trend and replete electrolytes  GI bleed EGD yesterday showed nonbleeding gastric ulcer and was biopsied.  No complications from the procedure or further GI bleeds.  Hemoglobin remained stable at  8.6.  Plan to hold anticoagulation for at least 2 weeks until follow-up with GI. -GI following, appreciate assistance -Continue PPI twice daily -Continue to hold Eliquis -Transfuse for Hgb < 7   Transaminitis No significant change in LFTs today. RUQ U/S yesterday was unremarkable.  She continues to denies any abdominal pain. -Trend LFTs -Avoid hepatotoxic meds if able  HTN Blood pressure remained stable with SBP in the 110s to 120s. One dose of her metoprolol held overnight. -Continue metoprolol -CTM -MAP goal >65  HLD -Continue Lipitor 20 mg daily   Essential tremor Anxiety Per granddaughter, patient does not have a diagnosis of Parkinson's. She does have essential tremor that has been stable. -Continue keppra -Continue gabapentin and duloxetine -CTM  Generalized deconditioning Goals of care PT recommended SNF placement.  Family reports they have pick a place in [facility] that they prefer.  They have discussed this with Education officer, museum. -Continue PT/OT -TOC following, appreciate recs -Palliative care following, appreciate recs  Best Practice (right click and "Reselect all SmartList Selections" daily)   Diet/type: dysphagia diet (see orders) DVT prophylaxis: SCD GI prophylaxis: PPI Lines: N/A Foley:  N/A Code Status:  DNR Last date of multidisciplinary goals of care discussion [Updated family at bedside]  Labs   CBC: Recent Labs  Lab 09/23/22 2300 09/24/22 0339 09/25/22 0535 09/26/22 0240 09/27/22 0357  WBC 7.5 7.6 7.4 8.7 8.4  HGB 9.0* 9.0* 8.6* 8.5* 8.6*  HCT 26.6* 26.9* 27.1* 26.8* 26.3*  MCV 92.7 94.4 95.8 95.4 95.3  PLT 104* 101* 106* 102* 122*    Basic Metabolic Panel: Recent Labs  Lab 09/23/22 0642 09/24/22 0339 09/25/22 0535 09/26/22 0240 09/27/22 0357  NA 140 138 139 139 139  K 3.8 4.0 3.9 3.4* 4.2  CL 95* 96* 98 96* 95*  CO2 36* 33* 33* 33* 36*  GLUCOSE 132* 137* 127* 129* 123*  BUN 42* 34* 26* 21 18  CREATININE 0.67 0.72 0.69 0.77  0.74  CALCIUM 8.5* 8.6* 8.6* 8.2* 8.4*  MG 2.2 2.0 2.1 1.8 2.2   GFR: Estimated Creatinine Clearance: 41.8 mL/min (by C-G formula based on SCr of 0.74 mg/dL). Recent Labs  Lab 09/24/22 0339 09/25/22 0535 09/26/22 0240 09/27/22 0357  WBC 7.6 7.4 8.7 8.4    Liver Function Tests: Recent Labs  Lab 09/22/22 0207 09/24/22 0339 09/25/22 0535 09/26/22 0240 09/27/22 0357  AST 67* 71* 158* 107* 111*  ALT 62* 66* 134* 122* 126*  ALKPHOS 94 57 68 75 82  BILITOT 0.4 0.4 0.4 0.6 0.6  PROT 5.6* 5.6* 5.3* 5.3* 5.4*  ALBUMIN 2.4* 3.0* 2.9* 2.7* 2.7*   No results for input(s): "LIPASE", "AMYLASE" in the last 168 hours. No results for input(s): "AMMONIA" in the last 168 hours.  ABG    Component Value Date/Time   PHART 7.43 09/20/2022 0833   PCO2ART 61 (H) 09/20/2022 0833   PO2ART 204 (H) 09/20/2022 0833   HCO3 41.2 (H) 09/20/2022 1554   TCO2 32 12/08/2010 2038   O2SAT 90.2 09/20/2022 1554     Coagulation Profile: No results for input(s): "INR", "PROTIME" in the last 168 hours.  Cardiac Enzymes: No results for input(s): "CKTOTAL", "  CKMB", "CKMBINDEX", "TROPONINI" in the last 168 hours.  HbA1C: Hemoglobin A1C  Date/Time Value Ref Range Status  11/30/2020 12:09 PM 5.4 4.0 - 5.6 % Final   HbA1c, POC (controlled diabetic range)  Date/Time Value Ref Range Status  06/14/2022 12:15 PM 5.6 0.0 - 7.0 % Final  12/17/2018 03:56 PM 5.3 0.0 - 7.0 % Final    CBG: Recent Labs  Lab 09/20/22 1145  GLUCAP 126*    Review of Systems:   As in HPI  Past Medical History:  She,  has a past medical history of Anxiety, Diabetes ([place]) (10/27/2013), DVT (deep venous thrombosis) ([place]), Essential tremor (10/27/2013), High cholesterol (1999), Hypertension, Ischemic stroke ([place]) (07/18/2014), Obesity, unspecified (10/27/2013), Pneumonia, and Unspecified hereditary and idiopathic peripheral neuropathy (10/27/2013).   Surgical History:   Past Surgical History:  Procedure Laterality Date    CATARACT EXTRACTION Bilateral    FOOT SURGERY     IR KYPHO LUMBAR INC FX REDUCE BONE BX UNI/BIL CANNULATION INC/IMAGING  06/15/2019     Social History:   reports that she has never smoked. She has never used smokeless tobacco. She reports that she does not drink alcohol and does not use drugs.   Family History:  Her family history includes Coronary artery disease in an other family member; Heart Problems in her brother and sister; Stroke in her mother. There is no history of Tremor.   Allergies Allergies  Allergen Reactions   Tape Rash    Please do not use Plastic Tape     Home Medications  Prior to Admission medications   Medication Sig Start Date End Date Taking? Authorizing Provider  acetaminophen (TYLENOL) 325 MG tablet Take 2 tablets (650 mg total) by mouth every 6 (six) hours as needed. Patient taking differently: Take 325 mg by mouth every 6 (six) hours as needed for moderate pain. 06/01/17  Yes Varney Biles, MD  ALPRAZolam (XANAX) 0.25 MG tablet Take 0.25 mg by mouth 2 (two) times daily as needed for anxiety.    Yes [provider]  amLODipine (NORVASC) 2.5 MG tablet Take 2.5 mg by mouth daily. 05/29/17  Yes [provider]  atorvastatin (LIPITOR) 20 MG tablet TAKE 1 TABLET BY MOUTH EVERY DAY IN THE EVENING Patient taking differently: Take 20 mg by mouth every evening. 05/10/22  Yes Zola Button, MD  CALCIUM PO Take 1 tablet by mouth daily.   Yes [provider]  Cholecalciferol (VITAMIN D PO) Take 1 tablet by mouth daily.   Yes [provider]  clopidogrel (PLAVIX) 75 MG tablet TAKE 1 TABLET BY MOUTH EVERY DAY Patient taking differently: Take 75 mg by mouth once. 06/17/22  Yes Zola Button, MD  diclofenac Sodium (VOLTAREN) 1 % GEL Apply 2 g topically 4 (four) times daily as needed. 07/26/22  Yes Zola Button, MD  DULoxetine (CYMBALTA) 20 MG capsule TAKE 1 CAPSULE BY MOUTH EVERY DAY Patient taking differently: Take 20 mg by mouth daily. 01/07/22   Yes Jessy Oto, MD  furosemide (LASIX) 20 MG tablet Take 20 mg by mouth daily. 06/08/18  Yes [provider]  gabapentin (NEURONTIN) 100 MG capsule Take 1 capsule (100 mg total) by mouth at bedtime. Patient taking differently: Take 100 mg by mouth in the morning and at bedtime. 03/05/22  Yes Star Age, MD  isosorbide mononitrate (IMDUR) 30 MG 24 hr tablet Take 15 mg by mouth daily. 08/09/22  Yes [provider]  levETIRAcetam (KEPPRA) 500 MG tablet Take 1 tablet (500 mg total) by  mouth 2 (two) times daily. 03/05/22  Yes Star Age, MD  lidocaine (LIDODERM) 5 % Place 1 patch onto the skin daily. Remove & Discard patch within 12 hours or as directed by MD 05/10/22  Yes Zola Button, MD  PAZEO 0.7 % SOLN Place 1 drop into both eyes at bedtime.  07/24/18  Yes [provider]  primidone (MYSOLINE) 250 MG tablet Take 1 tablet (250 mg total) by mouth 2 (two) times daily. 03/05/22  Yes Star Age, MD  RESTASIS 0.05 % ophthalmic emulsion Place 1 drop into both eyes 2 (two) times daily.  09/29/14  Yes [provider]  [place] test strip USE AS INSTRUCTED TO TEST ONCE A DAY DX CODE:E11.9 11/22/19   Benay Pike, MD  Accu-Chek Softclix Lancets lancets Use as instructed 09/23/20   Benay Pike, MD     Critical care time: 90

## 2022-09-28 ENCOUNTER — Inpatient Hospital Stay (HOSPITAL_COMMUNITY): Payer: Medicare Other

## 2022-09-28 DIAGNOSIS — I4891 Unspecified atrial fibrillation: Secondary | ICD-10-CM | POA: Diagnosis not present

## 2022-09-28 DIAGNOSIS — K259 Gastric ulcer, unspecified as acute or chronic, without hemorrhage or perforation: Secondary | ICD-10-CM

## 2022-09-28 DIAGNOSIS — D649 Anemia, unspecified: Secondary | ICD-10-CM

## 2022-09-28 LAB — COMPREHENSIVE METABOLIC PANEL
ALT: 123 U/L — ABNORMAL HIGH (ref 0–44)
AST: 100 U/L — ABNORMAL HIGH (ref 15–41)
Albumin: 2.8 g/dL — ABNORMAL LOW (ref 3.5–5.0)
Alkaline Phosphatase: 84 U/L (ref 38–126)
Anion gap: 7 (ref 5–15)
BUN: 17 mg/dL (ref 8–23)
CO2: 35 mmol/L — ABNORMAL HIGH (ref 22–32)
Calcium: 8.5 mg/dL — ABNORMAL LOW (ref 8.9–10.3)
Chloride: 96 mmol/L — ABNORMAL LOW (ref 98–111)
Creatinine, Ser: 0.6 mg/dL (ref 0.44–1.00)
GFR, Estimated: >60 mL/min (ref >60)
Glucose, Bld: 132 mg/dL — ABNORMAL HIGH (ref 70–99)
Potassium: 4.1 mmol/L (ref 3.5–5.1)
Sodium: 138 mmol/L (ref 135–145)
Total Bilirubin: 0.6 mg/dL (ref 0.3–1.2)
Total Protein: 5.9 g/dL — ABNORMAL LOW (ref 6.5–8.1)

## 2022-09-28 LAB — CBC
HCT: 28.5 % — ABNORMAL LOW (ref 36.0–46.0)
Hemoglobin: 9.2 g/dL — ABNORMAL LOW (ref 12.0–15.0)
MCH: 30.8 pg (ref 26.0–34.0)
MCHC: 32.3 g/dL (ref 30.0–36.0)
MCV: 95.3 fL (ref 80.0–100.0)
Platelets: 138 10*3/uL — ABNORMAL LOW (ref 150–400)
RBC: 2.99 MIL/uL — ABNORMAL LOW (ref 3.87–5.11)
RDW: 15.3 % (ref 11.5–15.5)
WBC: 9.2 10*3/uL (ref 4.0–10.5)
nRBC: 0 % (ref 0.0–0.2)

## 2022-09-28 LAB — TROPONIN I (HIGH SENSITIVITY)
Troponin I (High Sensitivity): 4 ng/L (ref <18)
Troponin I (High Sensitivity): 5 ng/L (ref <18)

## 2022-09-28 MED ORDER — METOPROLOL TARTRATE 25 MG PO TABS
25.0000 mg | ORAL_TABLET | Freq: Two times a day (BID) | ORAL | Status: DC
  Administered 2022-09-29 – 2022-10-01 (×6): 25 mg via ORAL
  Filled 2022-09-28 (×6): qty 1

## 2022-09-28 MED ORDER — DILTIAZEM HCL 60 MG PO TABS
30.0000 mg | ORAL_TABLET | Freq: Three times a day (TID) | ORAL | Status: DC
  Administered 2022-09-28 – 2022-10-02 (×11): 30 mg via ORAL
  Filled 2022-09-28 (×11): qty 1

## 2022-09-28 NOTE — Progress Notes (Signed)
Subjective:  Presently denies any chest pain or shortness of breath.  Remains in sinus rhythm earlier had vague chest discomfort and abdominal discomfort 2 sets of high-sensitivity troponin I are negative.  Objective:  Vital Signs in the last 24 hours: Temp:  [97.7 F (36.5 C)-98 F (36.7 C)] 98 F (36.7 C) (11/04 1528) Pulse Rate:  [57-74] 74 (11/04 1711) Resp:  [15-38] 38 (11/04 1250) BP: (115-126)/(43-62) 116/46 (11/04 1712) SpO2:  [96 %-99 %] 96 % (11/04 1250)  Intake/Output from previous day: 11/03 0701 - 11/04 0700 In: 500 [P.O.:500] Out: 250 [Urine:250] Intake/Output from this shift: No intake/output data recorded.  Physical Exam: Neck: no adenopathy, no carotid bruit, no JVD, and supple, symmetrical, trachea midline Lungs: Decreased breath sound at bases Heart: regular rate and rhythm, S1, S2 normal, and 2/6 systolic murmur noted Abdomen: soft, non-tender; bowel sounds normal; no masses,  no organomegaly Extremities: extremities normal, atraumatic, no cyanosis or edema  Lab Results: Recent Labs    09/27/22 0357 09/28/22 0114  WBC 8.4 9.2  HGB 8.6* 9.2*  PLT 122* 138*   Recent Labs    09/27/22 0357 09/28/22 0114  NA 139 138  K 4.2 4.1  CL 95* 96*  CO2 36* 35*  GLUCOSE 123* 132*  BUN 18 17  CREATININE 0.74 0.60   No results for input(s): "TROPONINI" in the last 72 hours.  Invalid input(s): "CK", "MB" Hepatic Function Panel Recent Labs    09/28/22 0114  PROT 5.9*  ALBUMIN 2.8*  AST 100*  ALT 123*  ALKPHOS 84  BILITOT 0.6   No results for input(s): "CHOL" in the last 72 hours. No results for input(s): "PROTIME" in the last 72 hours.  Imaging: Imaging results have been reviewed and DG CHEST PORT 1 VIEW  Result Date: 09/28/2022 CLINICAL DATA:  Dyspnea. EXAM: PORTABLE CHEST 1 VIEW COMPARISON:  Radiograph yesterday. FINDINGS: Stable cardiomegaly. Unchanged mediastinal contours. Aortic atherosclerosis. Bibasilar collapse/consolidation with pleural  effusions, without significant interval change. No new airspace disease. No pneumothorax. No pulmonary edema IMPRESSION: 1. Unchanged radiographic appearance of the chest with bibasilar collapse/consolidation and pleural effusions. 2. Stable cardiomegaly. Electronically Signed   By: Keith Rake M.D.   On: 09/28/2022 15:20   VAS [place] UPPER EXTREMITY VENOUS DUPLEX  Result Date: 09/28/2022 UPPER VENOUS STUDY  Patient Name:  [name] [name]  Date of Exam:   09/27/2022 Medical Rec #: [id number]         Accession #:    [id number] Date of Birth: May 03, 1930        Patient Gender: F Patient Age:   27 years Exam Location:  [facility] - Inpatient Procedure:      VAS [place] UPPER EXTREMITY VENOUS DUPLEX Referring Phys: Teressa Senter DESAI --------------------------------------------------------------------------------  Indications: Swelling Risk Factors: None identified. Limitations: Bandages and poor ultrasound/tissue interface. Comparison Study: No prior studies. Performing Technologist: Oliver Hum RVT  Examination Guidelines: A complete evaluation includes B-mode imaging, spectral Doppler, color Doppler, and power Doppler as needed of all accessible portions of each vessel. Bilateral testing is considered an integral part of a complete examination. Limited examinations for reoccurring indications may be performed as noted.  Right Findings: +----------+------------+---------+-----------+----------+-------+ RIGHT     CompressiblePhasicitySpontaneousPropertiesSummary +----------+------------+---------+-----------+----------+-------+ IJV           Full       Yes       Yes                      +----------+------------+---------+-----------+----------+-------+ Subclavian  Full       Yes       Yes                      +----------+------------+---------+-----------+----------+-------+ Axillary      Full       Yes       Yes                       +----------+------------+---------+-----------+----------+-------+ Brachial      Full       Yes       Yes                      +----------+------------+---------+-----------+----------+-------+ Radial        Full                                          +----------+------------+---------+-----------+----------+-------+ Ulnar         Full                                          +----------+------------+---------+-----------+----------+-------+ Cephalic      Full                                          +----------+------------+---------+-----------+----------+-------+ Basilic       None                                   Acute  +----------+------------+---------+-----------+----------+-------+ Thrombus located in the basilic vein extends from the distal forearm into the proximal forearm.  Left Findings: +----------+------------+---------+-----------+----------+-------+ LEFT      CompressiblePhasicitySpontaneousPropertiesSummary +----------+------------+---------+-----------+----------+-------+ Subclavian    Full       Yes       Yes                      +----------+------------+---------+-----------+----------+-------+  Summary:  Right: No evidence of deep vein thrombosis in the upper extremity. Findings consistent with acute superficial vein thrombosis involving the right basilic vein.  Left: No evidence of thrombosis in the subclavian.  *See table(s) above for measurements and observations.  Diagnosing physician: Orlie Pollen Electronically signed by Orlie Pollen on 09/28/2022 at 1:32:25 PM.    Final    DG CHEST PORT 1 VIEW  Result Date: 09/27/2022 CLINICAL DATA:  Dyspnea. EXAM: PORTABLE CHEST 1 VIEW COMPARISON:  09/25/2022 FINDINGS: 0734 hours. The cardio pericardial silhouette is enlarged. Bibasilar collapse/consolidation with bilateral pleural effusions, similar to prior. Vascular congestion without overt airspace pulmonary edema. Telemetry leads overlie the  chest. IMPRESSION: Bibasilar collapse/consolidation with bilateral pleural effusions, similar to prior. Electronically Signed   By: Misty Stanley M.D.   On: 09/27/2022 08:09    Cardiac Studies:  Assessment/Plan:  Acute on chronic diastolic left heart failure Acute respiratory failure with hypoxia Paroxysmal atrial fibrillation status post cardioversion off anticoagulation for now in view of recent GI bleed Recent GI bleed Gastric ulcer Anemia of blood loss HTN HLD Old stroke Obesity Arthritis Parkinson's disease Plan Continue present management Out of bed to chair Increase ambulation as tolerated   LOS: 11  days    [name] 09/28/2022, 5:28 PM

## 2022-09-28 NOTE — Progress Notes (Addendum)
Daily Progress Note Intern Pager: [phone]  Patient name: [name] [name] Medical record number: [id number] Date of birth: [birth year] Age: 88 y.o. Gender: female  Primary Care Provider: Zola Button, MD Consultants: CCM, Cards. GI Code Status: Full  Pt Overview and Major Events to Date:  10/27 Admitted to ICU 10/29 CT head no acute intracranial abnormalities 10/29 chest x-ray moderate bilateral pleural effusion, R>L 10/31: TEE/DCCV under conscious sedation  Assessment and Plan: [name] is an 88 year old female with hx of HF who was admitted with new onset A-fib with RVR and volume overloaded.  Eventually transferred to the ICU for hypoxic respiratory failure and hospitalization complicated by GI bleed. Pertinent PMH/PSH includes CVA, ET, HTN, HLD, seizure, essential tremor and diastolic heart failure.  * Atrial fibrillation with RVR ([place]) Patient on admission was found to have new onset A-fib with RVR.  Received TEE/DCCV on 10/31.  Has since cardioverted and remained stable. - Cardiology following, appreciate recs - Now on p.o. medications: - Diltiazem 30mg  QID - Lopressor 50 mg TID - Amiodarone 200 mg twice daily x7 days > qd on 11/6 - Continue Cardiac monitoring - Holding Eliquis for GI bleed per GI    Acute on chronic diastolic heart failure ([place]) She is euvolemic on exam.  Currently on 2 L [place] with reassuring O2 sats.  Exam have good work of breathing and clear BS bilaterally - Cardiology following, appreciate recs - Strict I's and O's, daily weight - Continue Torsemide 20mg  daily  Left hip pain Pt chronic leg pain from CVA  which cause difficulty with ambulation. Also have known degenerative changes of lumbar spine.  Left hip x-ray without acute fracture. - PT/OT   Essential tremor Stable. Still have head tremors with is present at baseline. Home medication includes Xanax and primidone -Holding home Xanax -f/u neuro outpatient  Anemia ABLA from GI  bleed.  Upper endoscopy by GI showed nonbleeding ulcer.  Hgb this morning is 9.2 and stable. Will continue PPI given GI bleed and continue to hold Eliquis per GI recommendation. -GI following, appreciate recs -Continue daily am  CBC -Protonix 40 mg twice daily -Transfusion threshold <7     FEN/GI: DYS3 PPx: SCDs, holding Eliquis for GI bleed Dispo:SNF pending clinical improvement . Barriers include  Clinical status.   Subjective:  Ms. [name] [name] is laying comfortably in bed with husband at bedside.  He said that she is doing much better and symptoms seems to have improved.  Husband reports only concern is intermittent tachypnea.  Objective: Temp:  [97.7 F (36.5 C)-98.2 F (36.8 C)] 97.8 F (36.6 C) (11/04 1131) Pulse Rate:  [57-74] 71 (11/04 1054) Resp:  [15-36] 21 (11/04 0310) BP: (108-147)/(19-62) 126/43 (11/04 1054) SpO2:  [95 %-99 %] 98 % (11/04 0310) Physical Exam: General: Elderly lady, ill-appearing, laying in bed Cardiovascular: RRR, no murmurs. Respiratory: CTAB, normal WOB on 2 LNC Abdomen: Positive bowel sounds.  Soft and no tenderness. Extremities: Well perfused.  No LE edema. Neuro: Alert and   Laboratory: Most recent CBC Lab Results  Component Value Date   WBC 9.2 09/28/2022   HGB 9.2 (L) 09/28/2022   HCT 28.5 (L) 09/28/2022   MCV 95.3 09/28/2022   PLT 138 (L) 09/28/2022   Most recent BMP    Latest Ref Rng & Units 09/28/2022    1:14 AM  BMP  Glucose 70 - 99 mg/dL 132   BUN 8 - 23 mg/dL 17   Creatinine 0.44 - 1.00 mg/dL 0.60  Sodium 135 - 145 mmol/L 138   Potassium 3.5 - 5.1 mmol/L 4.1   Chloride 98 - 111 mmol/L 96   CO2 22 - 32 mmol/L 35   Calcium 8.9 - 10.3 mg/dL 8.5     Imaging/Diagnostic Tests: CXR Radiologist Impression: Bibasilar collapse/consolidation with bilateral pleural effusions,similar to prior. My interpretation: mild bilateral Pleura effusion Alen Bleacher, MD 09/28/2022, 11:53 AM  PGY-2, [place] Intern  pager: [phone], text pages welcome Secure chat group [place]

## 2022-09-28 NOTE — Plan of Care (Signed)
  Problem: Education: Goal: Knowledge of General Education information will improve Description: Including pain rating scale, medication(s)/side effects and non-pharmacologic comfort measures Outcome: Progressing   Problem: Health Behavior/Discharge Planning: Goal: Ability to manage health-related needs will improve Outcome: Progressing   Problem: Clinical Measurements: Goal: Diagnostic test results will improve Outcome: Progressing   Problem: Activity: Goal: Risk for activity intolerance will decrease Outcome: Progressing   Problem: Pain Managment: Goal: General experience of comfort will improve Outcome: Progressing   

## 2022-09-28 NOTE — Progress Notes (Signed)
FMTS Interim Progress Note  S: Patient seen at bedside with 3 family members present.  Chest pain has resolved, patient is still having some abdominal pain which they believed to be due to gas pain.  O: BP 127/65 (BP Location: Right Arm)   Pulse 68   Temp 97.9 F (36.6 C) (Oral)   Resp 20   Ht 5' (1.524 m)   Wt 70.7 kg   SpO2 95%   BMI 30.44 kg/m   General: Resting in bed comfortably, NAD CV: RRR, no murmurs Respiratory: Mildly tachypneic on 2 L nasal cannula, oxygen saturation as low as 90% on monitor Abdominal: Soft, mildly tender diffusely  A/P: Mild abdominal discomfort, I wonder if could be related to gastric ulcer although pain seems to have started earlier this afternoon She remains in normal sinus rhythm after recent cardioversion No change to current plan  Zola Button, MD 09/28/2022, 9:56 PM PGY-3, [place] [facility] Service pager [phone]

## 2022-09-28 NOTE — Assessment & Plan Note (Addendum)
Likely initially secondary to GI bleed. EGD demonstrated nonbleeding ulcer. Hgb stable. Will continue PPI given GI bleed. -GI following, appreciate recs -Continue daily am CBC -Protonix 40 mg twice daily -monitor for signs of bleeding  -Transfusion threshold <7

## 2022-09-28 NOTE — Progress Notes (Addendum)
Arrived at patient's bedside to reassess patient this evening.  Upon arrival patient was laying comfortably in bed with 2 sons at bedside.  Patient complained of chest pain and mild abdominal pain earlier in the day about 3 hours ago.  EKG, troponin and checks x-ray were ordered at the time.  Troponin was within normal limits, chest x-ray showed no acute changes and EKG showed sinus rhythm.  Both sons and patient reports chest pain has since resolved but still endorses mild abdominal pain that is tolerable at this time. Patient's sons report her RR was elevated to the high 20s and as high as 31. However in the last couple of hours it has normalized to the lower 20s. Overall patient's son feel like she is doing better in the last hour. Informed sons of the result of the CXR, EKG and troponin.  Son also expressed concerns about area of  erythema on the right arm at the site of her amiodarone infusion.  He does report that the swelling has reduced since transferring to the floor and patient does endorse mild pain/ discomfort in the area.  Doppler ultrasound showed superficial thrombosis but negative for deep vein thrombosis consistent with IV infilteration. I encourage continued application of warm compress.  Vitals: BP 116/46  HR 74 RR 25  Exam General: Laying in bed comfortably, ill-appearing Cardiac: RRR, no M/R/G Respiratory: Normal WOB on 2L [place] Remedies: Right arm with area of erythema and induration. No LE Edema  Alen Bleacher, MD PGY-2, [facility] Family Medicine Resident  Please page [phone] with questions.

## 2022-09-29 ENCOUNTER — Encounter (HOSPITAL_COMMUNITY): Payer: Self-pay | Admitting: Gastroenterology

## 2022-09-29 DIAGNOSIS — M7989 Other specified soft tissue disorders: Secondary | ICD-10-CM

## 2022-09-29 DIAGNOSIS — R109 Unspecified abdominal pain: Secondary | ICD-10-CM

## 2022-09-29 DIAGNOSIS — I82611 Acute embolism and thrombosis of superficial veins of right upper extremity: Secondary | ICD-10-CM

## 2022-09-29 DIAGNOSIS — I4891 Unspecified atrial fibrillation: Secondary | ICD-10-CM | POA: Diagnosis not present

## 2022-09-29 LAB — CBC WITH DIFFERENTIAL/PLATELET
Abs Immature Granulocytes: 0.07 10*3/uL (ref 0.00–0.07)
Basophils Absolute: 0.1 10*3/uL (ref 0.0–0.1)
Basophils Relative: 1 %
Eosinophils Absolute: 0.1 10*3/uL (ref 0.0–0.5)
Eosinophils Relative: 1 %
HCT: 28.2 % — ABNORMAL LOW (ref 36.0–46.0)
Hemoglobin: 9.1 g/dL — ABNORMAL LOW (ref 12.0–15.0)
Immature Granulocytes: 1 %
Lymphocytes Relative: 13 %
Lymphs Abs: 1.4 10*3/uL (ref 0.7–4.0)
MCH: 31 pg (ref 26.0–34.0)
MCHC: 32.3 g/dL (ref 30.0–36.0)
MCV: 95.9 fL (ref 80.0–100.0)
Monocytes Absolute: 0.9 10*3/uL (ref 0.1–1.0)
Monocytes Relative: 8 %
Neutro Abs: 7.7 10*3/uL (ref 1.7–7.7)
Neutrophils Relative %: 76 %
Platelets: 138 10*3/uL — ABNORMAL LOW (ref 150–400)
RBC: 2.94 MIL/uL — ABNORMAL LOW (ref 3.87–5.11)
RDW: 15.1 % (ref 11.5–15.5)
WBC: 10.2 10*3/uL (ref 4.0–10.5)
nRBC: 0.4 % — ABNORMAL HIGH (ref 0.0–0.2)

## 2022-09-29 LAB — COMPREHENSIVE METABOLIC PANEL
ALT: 142 U/L — ABNORMAL HIGH (ref 0–44)
AST: 125 U/L — ABNORMAL HIGH (ref 15–41)
Albumin: 2.7 g/dL — ABNORMAL LOW (ref 3.5–5.0)
Alkaline Phosphatase: 93 U/L (ref 38–126)
Anion gap: 8 (ref 5–15)
BUN: 18 mg/dL (ref 8–23)
CO2: 34 mmol/L — ABNORMAL HIGH (ref 22–32)
Calcium: 8.3 mg/dL — ABNORMAL LOW (ref 8.9–10.3)
Chloride: 95 mmol/L — ABNORMAL LOW (ref 98–111)
Creatinine, Ser: 0.71 mg/dL (ref 0.44–1.00)
GFR, Estimated: >60 mL/min (ref >60)
Glucose, Bld: 129 mg/dL — ABNORMAL HIGH (ref 70–99)
Potassium: 4.1 mmol/L (ref 3.5–5.1)
Sodium: 137 mmol/L (ref 135–145)
Total Bilirubin: 0.6 mg/dL (ref 0.3–1.2)
Total Protein: 5.7 g/dL — ABNORMAL LOW (ref 6.5–8.1)

## 2022-09-29 MED ORDER — POLYETHYLENE GLYCOL 3350 17 G PO PACK
17.0000 g | PACK | Freq: Every day | ORAL | Status: DC
  Administered 2022-09-29: 17 g via ORAL
  Filled 2022-09-29 (×3): qty 1

## 2022-09-29 MED ORDER — HEPARIN SODIUM (PORCINE) 5000 UNIT/ML IJ SOLN
5000.0000 [IU] | Freq: Three times a day (TID) | INTRAMUSCULAR | Status: DC
  Administered 2022-09-29 – 2022-10-03 (×13): 5000 [IU] via SUBCUTANEOUS
  Filled 2022-09-29 (×14): qty 1

## 2022-09-29 NOTE — Assessment & Plan Note (Addendum)
Right arm swelling that started a few days ago, DVT US notable for superficial thrombosis of right basilar vein. Thrombosis should resolve on its own over time, though will try conservative management until then. -will trial alternating cold and warm compress -apply voltaren gel to area for pain control -continue to monitor closely  -consider abx if patient becomes febrile

## 2022-09-29 NOTE — Assessment & Plan Note (Addendum)
Reported abdominal pain yesterday, abdominal US normal. Today still ongoing, more generalized with epigastric pain more prominent.  -trial miralax -continue to monitor -consider CT abdomen to evaluate for possible ischemic changes given cardiac history

## 2022-09-29 NOTE — Progress Notes (Addendum)
Daily Progress Note Intern Pager: [phone]  Patient name: [name] [name] Medical record number: [id number] Date of birth: December 08, 1929 Age: 88 y.o. Gender: female  Primary Care Provider: Zola Button, MD Consultants: CCM, Cardiology, GI Code Status: Full  Pt Overview and Major Events to Date:  10/23: Admitted to [place] 10/27: Admitted to CCM 10/29: CT head demonstrated no acute abnormalities 10/29: CXR moderate bilateral pleural effusions, R>L 10/31: TEE/DCCV performed   Assessment and Plan: Ms.Krisy Pennie is an 88 year old female with hx of HF who was admitted with new onset A-fib with RVR and volume overloaded.  Eventually transferred to the ICU for hypoxic respiratory failure and hospitalization complicated by GI bleed.  Pertinent PMH/PSH includes hypertension, HLD, seizures, essential tremor, diastolic heart failure and CVA.  * Atrial fibrillation with RVR ([place]) Patient on admission was found to have new onset A-fib with RVR.  Received TEE/DCCV on 10/31, s/p cardioversion and has remained in normal sinus rhythm . - Cardiology following, appreciate continued recs - Diltiazem 30mg  QID - Lopressor 50 mg TID - Amiodarone 200 mg twice daily x7 days > qd on 11/6 - Continue Cardiac monitoring - Holding Eliquis for GI bleed per GI  - heparin for DVT ppx, will monitor closely for bleeding and discontinue as appropriate    Acute on chronic diastolic heart failure (HCC) Remains euvolemic on exam, stable on 3L. Down 6.5L since admission.  - Cardiology following, appreciate recs - Strict I's and O's, daily weight - Continue Torsemide 20mg  daily -wean O2 as tolerated  Left hip pain Pt chronic leg pain from CVA  which cause difficulty with ambulation. Also have known degenerative changes of lumbar spine.  Left hip x-ray without acute fracture. - PT/OT   Essential tremor Stable. Still have head tremors with is present at baseline. Home medication includes Xanax and  primidone -Holding home Xanax -f/u neuro outpatient  Swelling of right extremity Right arm swelling that started a few days ago, DVT US notable for superficial thrombosis of right basilar vein. Improving, patient remains afebrile.  -warm compress -continue to monitor closely  -consider abx if patient becomes febrile  Abdominal pain Reported abdominal pain yesterday, abdominal US normal. Today still ongoing, more generalized with epigastric pain more prominent.  -trial miralax -continue to monitor -consider CT abdomen to evaluate for possible ischemic changes given cardiac history   Anemia Likely initially secondary to GI bleed. EGD demonstrated nonbleeding ulcer. Hgb stable. Will continue PPI given GI bleed and continue to hold Eliquis per GI recommendation. -GI following, appreciate recs -Continue daily am  CBC -Protonix 40 mg twice daily -monitor for signs of bleeding  -Transfusion threshold <7      FEN/GI: dysphagia 3 diet  PPx: heparin subq Dispo:SNF pending clinical improvement   Subjective:  No acute overnight events reported. Patient still reporting abdominal pain.   Objective: Temp:  [97.8 F (36.6 C)-98.4 F (36.9 C)] 98.4 F (36.9 C) (11/05 0339) Pulse Rate:  [65-74] 65 (11/05 0339) Resp:  [18-38] 18 (11/05 0339) BP: (109-128)/(39-65) 128/39 (11/05 0339) SpO2:  [94 %-96 %] 95 % (11/05 0339) Physical Exam: General: Patient laying comfortably, in no acute distress. Cardiovascular: RRR, no murmurs or gallops auscultated  Respiratory: CTAB Abdomen: soft, generalized tenderness along epigastric region more prominently, bowel sounds present Extremities: improved swelling of right extremity with associated warmth but no associated lesions or drainage, no LE edema noted bilaterally, distal pulses strong and equal bilaterally   Laboratory: Most recent CBC Lab Results  Component  Value Date   WBC 10.2 09/29/2022   HGB 9.1 (L) 09/29/2022   HCT 28.2 (L) 09/29/2022    MCV 95.9 09/29/2022   PLT 138 (L) 09/29/2022   Most recent BMP    Latest Ref Rng & Units 09/29/2022    1:34 AM  BMP  Glucose 70 - 99 mg/dL 129   BUN 8 - 23 mg/dL 18   Creatinine 0.44 - 1.00 mg/dL 0.71   Sodium 135 - 145 mmol/L 137   Potassium 3.5 - 5.1 mmol/L 4.1   Chloride 98 - 111 mmol/L 95   CO2 22 - 32 mmol/L 34   Calcium 8.9 - 10.3 mg/dL 8.3      Imaging/Diagnostic Tests: DG CHEST PORT 1 VIEW  Result Date: 09/28/2022 CLINICAL DATA:  Dyspnea. EXAM: PORTABLE CHEST 1 VIEW COMPARISON:  Radiograph yesterday. FINDINGS: Stable cardiomegaly. Unchanged mediastinal contours. Aortic atherosclerosis. Bibasilar collapse/consolidation with pleural effusions, without significant interval change. No new airspace disease. No pneumothorax. No pulmonary edema IMPRESSION: 1. Unchanged radiographic appearance of the chest with bibasilar collapse/consolidation and pleural effusions. 2. Stable cardiomegaly. Electronically Signed   By: Keith Rake M.D.   On: 09/28/2022 15:20    Donney Dice, DO 09/29/2022, 9:30 AM  PGY-3, [place] Intern pager: [phone], text pages welcome Secure chat group [place]

## 2022-09-29 NOTE — Progress Notes (Signed)
Subjective:  Denies chest pain or shortness of breath.  Remains bedbound and has not walked in the last 2 weeks.  Objective:  Vital Signs in the last 24 hours: Temp:  [97.8 F (36.6 C)-98.4 F (36.9 C)] 98.4 F (36.9 C) (11/05 0339) Pulse Rate:  [65-74] 65 (11/05 0339) Resp:  [18-38] 18 (11/05 0339) BP: (109-128)/(39-65) 128/39 (11/05 0339) SpO2:  [94 %-96 %] 95 % (11/05 0339)  Intake/Output from previous day: 11/04 0701 - 11/05 0700 In: -  Out: 500 [Urine:500] Intake/Output from this shift: No intake/output data recorded.  Physical Exam: Exam unchanged  Lab Results: Recent Labs    09/28/22 0114 09/29/22 0134  WBC 9.2 10.2  HGB 9.2* 9.1*  PLT 138* 138*   Recent Labs    09/28/22 0114 09/29/22 0134  NA 138 137  K 4.1 4.1  CL 96* 95*  CO2 35* 34*  GLUCOSE 132* 129*  BUN 17 18  CREATININE 0.60 0.71   No results for input(s): "TROPONINI" in the last 72 hours.  Invalid input(s): "CK", "MB" Hepatic Function Panel Recent Labs    09/29/22 0134  PROT 5.7*  ALBUMIN 2.7*  AST 125*  ALT 142*  ALKPHOS 93  BILITOT 0.6   No results for input(s): "CHOL" in the last 72 hours. No results for input(s): "PROTIME" in the last 72 hours.  Imaging: Imaging results have been reviewed and DG CHEST PORT 1 VIEW  Result Date: 09/28/2022 CLINICAL DATA:  Dyspnea. EXAM: PORTABLE CHEST 1 VIEW COMPARISON:  Radiograph yesterday. FINDINGS: Stable cardiomegaly. Unchanged mediastinal contours. Aortic atherosclerosis. Bibasilar collapse/consolidation with pleural effusions, without significant interval change. No new airspace disease. No pneumothorax. No pulmonary edema IMPRESSION: 1. Unchanged radiographic appearance of the chest with bibasilar collapse/consolidation and pleural effusions. 2. Stable cardiomegaly. Electronically Signed   By: Keith Rake M.D.   On: 09/28/2022 15:20   VAS [place] UPPER EXTREMITY VENOUS DUPLEX  Result Date: 09/28/2022 UPPER VENOUS STUDY  Patient Name:   [name] [name]  Date of Exam:   09/27/2022 Medical Rec #: [id number]         Accession #:    [id number] Date of Birth: [birth year]        Patient Gender: F Patient Age:   88 years Exam Location:  [facility] Procedure:      VAS [place] UPPER EXTREMITY VENOUS DUPLEX Referring Phys: Teressa Senter DESAI --------------------------------------------------------------------------------  Indications: Swelling Risk Factors: None identified. Limitations: Bandages and poor ultrasound/tissue interface. Comparison Study: No prior studies. Performing Technologist: Oliver Hum RVT  Examination Guidelines: A complete evaluation includes B-mode imaging, spectral Doppler, color Doppler, and power Doppler as needed of all accessible portions of each vessel. Bilateral testing is considered an integral part of a complete examination. Limited examinations for reoccurring indications may be performed as noted.  Right Findings: +----------+------------+---------+-----------+----------+-------+ RIGHT     CompressiblePhasicitySpontaneousPropertiesSummary +----------+------------+---------+-----------+----------+-------+ IJV           Full       Yes       Yes                      +----------+------------+---------+-----------+----------+-------+ Subclavian    Full       Yes       Yes                      +----------+------------+---------+-----------+----------+-------+ Axillary      Full       Yes       Yes                      +----------+------------+---------+-----------+----------+-------+  Brachial      Full       Yes       Yes                      +----------+------------+---------+-----------+----------+-------+ Radial        Full                                          +----------+------------+---------+-----------+----------+-------+ Ulnar         Full                                          +----------+------------+---------+-----------+----------+-------+ Cephalic      Full                                           +----------+------------+---------+-----------+----------+-------+ Basilic       None                                   Acute  +----------+------------+---------+-----------+----------+-------+ Thrombus located in the basilic vein extends from the distal forearm into the proximal forearm.  Left Findings: +----------+------------+---------+-----------+----------+-------+ LEFT      CompressiblePhasicitySpontaneousPropertiesSummary +----------+------------+---------+-----------+----------+-------+ Subclavian    Full       Yes       Yes                      +----------+------------+---------+-----------+----------+-------+  Summary:  Right: No evidence of deep vein thrombosis in the upper extremity. Findings consistent with acute superficial vein thrombosis involving the right basilic vein.  Left: No evidence of thrombosis in the subclavian.  *See table(s) above for measurements and observations.  Diagnosing physician: Orlie Pollen Electronically signed by Orlie Pollen on 09/28/2022 at 1:32:25 PM.    Final     Cardiac Studies:  Assessment/Plan:  Resolving acute on chronic diastolic left heart failure Status post acute respiratory failure with hypoxia Paroxysmal atrial fibrillation status post cardioversion off anticoagulation for now in view of recent GI bleed Recent GI bleed Gastric ulcer Anemia of blood loss HTN HLD Old stroke Obesity Arthritis Parkinson's disease Plan Continue present management Out of bed as tolerated Dr. Doylene Canard will follow from a.m.    LOS: 12 days    [name] 09/29/2022, 8:28 AM

## 2022-09-30 ENCOUNTER — Inpatient Hospital Stay (HOSPITAL_COMMUNITY): Payer: Medicare Other

## 2022-09-30 DIAGNOSIS — R82998 Other abnormal findings in urine: Secondary | ICD-10-CM

## 2022-09-30 DIAGNOSIS — N390 Urinary tract infection, site not specified: Secondary | ICD-10-CM

## 2022-09-30 DIAGNOSIS — I4891 Unspecified atrial fibrillation: Secondary | ICD-10-CM | POA: Diagnosis not present

## 2022-09-30 LAB — COMPREHENSIVE METABOLIC PANEL
ALT: 163 U/L — ABNORMAL HIGH (ref 0–44)
AST: 144 U/L — ABNORMAL HIGH (ref 15–41)
Albumin: 2.6 g/dL — ABNORMAL LOW (ref 3.5–5.0)
Alkaline Phosphatase: 89 U/L (ref 38–126)
Anion gap: 12 (ref 5–15)
BUN: 20 mg/dL (ref 8–23)
CO2: 31 mmol/L (ref 22–32)
Calcium: 8.2 mg/dL — ABNORMAL LOW (ref 8.9–10.3)
Chloride: 93 mmol/L — ABNORMAL LOW (ref 98–111)
Creatinine, Ser: 0.71 mg/dL (ref 0.44–1.00)
GFR, Estimated: >60 mL/min (ref >60)
Glucose, Bld: 117 mg/dL — ABNORMAL HIGH (ref 70–99)
Potassium: 4.1 mmol/L (ref 3.5–5.1)
Sodium: 136 mmol/L (ref 135–145)
Total Bilirubin: 0.7 mg/dL (ref 0.3–1.2)
Total Protein: 5.9 g/dL — ABNORMAL LOW (ref 6.5–8.1)

## 2022-09-30 LAB — CBC
HCT: 28.1 % — ABNORMAL LOW (ref 36.0–46.0)
Hemoglobin: 9.1 g/dL — ABNORMAL LOW (ref 12.0–15.0)
MCH: 30.6 pg (ref 26.0–34.0)
MCHC: 32.4 g/dL (ref 30.0–36.0)
MCV: 94.6 fL (ref 80.0–100.0)
Platelets: 158 10*3/uL (ref 150–400)
RBC: 2.97 MIL/uL — ABNORMAL LOW (ref 3.87–5.11)
RDW: 14.9 % (ref 11.5–15.5)
WBC: 11.1 10*3/uL — ABNORMAL HIGH (ref 4.0–10.5)
nRBC: 0 % (ref 0.0–0.2)

## 2022-09-30 MED ORDER — IOHEXOL 9 MG/ML PO SOLN
500.0000 mL | ORAL | Status: AC
  Administered 2022-09-30 (×2): 500 mL via ORAL

## 2022-09-30 MED ORDER — IOHEXOL 350 MG/ML SOLN
75.0000 mL | Freq: Once | INTRAVENOUS | Status: AC | PRN
  Administered 2022-09-30: 75 mL via INTRAVENOUS

## 2022-09-30 NOTE — Care Management Important Message (Signed)
Important Message  Patient Details  Name: [name] [name] MRN: [id number] Date of Birth: October 02, 1930   Medicare Important Message Given:  Yes     Shelda Altes 09/30/2022, 12:08 PM

## 2022-09-30 NOTE — Hospital Course (Addendum)
[name] [name] is a 88 y.o. female who presented with leg weakness and SOB and found to have pleural effusions/volume overload with what is likely acute on chronic CHF exacerbation in the setting of new onset a-fib with RVR. PMH includes CVA, essential tremor, HTN, HLD, and seizures.   Atrial fibrillation with RVR ([place]) Presented in new onset a-fib w/ RVR on 10/23. Placed on digoxin, diltiazem, and metoprolol for rate and rhythm control. Echo demonstrated moderate LVH with normal LV function. Cardiology added amiodarone for increasing rates, though rates increased to 160s with increasing respiratory distress. She was then transferred to ICU on BiPAP on 10/27. On 10/31, she was successfully cardioverted to NSR. She was weaned off BiPAP and returned to the floor on 11/4. She remained rate controlled throughout remainder of admission.   Acute on chronic diastolic heart failure (HCC)  Pleural effusion  Acute on chronic PE Presented with worsening SOB, LE weakness and pitting edema, and O2 requirement of 2L [place]. Had pleural effusions on CXR likely 2/2 afib. BNP elevated with flat trops. IV lasix given for diuresis. She also reported some chest pain; EKG, troponins, and CXR normal at that time. Suspect pain 2/2 pleural effusions from overload. Chest pain resolved. DVT US on RLE (below) with DVT, in context of continued O2 requirement, yielded CTA PE that revealed acute on chronic scattered PE with signs of right heart strain. She was placed on heparin drip.  By discharge, she was euvolemic without further chest pain.  Upper GI bleed and abdominal pain Had large melanotic stool in the ICU on 10/29. 1u pRBCs were given for Hgb 7. GI consulted and performed EGD which demonstrated nonbleeding gastric ulcer. Hgb remained stable thereafter. Patient also reported increasing abdominal pain once of the ICU; CTAP with likely ileus. Miralax given and PT/OT assisted with mobility, and patient began having more stools.  LFTs were also noted over admission; GI obtained abd [place] which was unremarkable. Hepatitis panel also negative. Cardiology consulted for potential amiodarone interaction and reduced doses of statin and eventually amio. By discharge, she was placed on ferrous sulfate 325 mg daily, and LFTs were stable and downtrending.  Bilateral hip and leg pain Had difficulty ambulating before admission. Known chronic pain in left leg from CVA. Degenerative changes in lumbar spine also known. Left hip XR without fracture. Over admission, right hip and leg also began to hurt. CTAP obtained for abdominal pain but did not demonstrate any MSK findings of hips. DVT US of RLE demonstrated DVT. CTA PE was obtained as above with PE; heparin drip was started.  IR also recommended serial [place] of DVT as below if she d/c anticoagulation. PT/OT evaluated for overall deconditioning after the stay and recommended her for CIR.  Superficial thrombosis of right basilar vein Presented with erythema and swelling around IV site used for amiodarone infusion when back to the floor. Exam notable for tenderness and firmness in addition to erythema. DVT US of RUE with thrombosis of basilar vein. Alternating warm and cool compresses with voltaren gel was utilized for conservative management. By discharge, her symptoms were stable.  UTI Presented with dark, brown-colored urine over admission. UA on 10/29 suspicious for UTI, though patient did not have suprapubic tenderness per chart review. Repeat UA on 11/8 further demonstrated UTI, and with her suprapubic pain, she was started on cefdinir. Ucx returned with E. Faecalis and E. Coli; antibiotics were switched to 4 days of amoxicillin.   Chronic stable conditions HLD - atorvastatin 20mg  daily HTN -  Metoprolol 25mg  BID Prior CVA w/R weakness - transition from Plavix to Eliquis Diet controlled T2DM - monitor glucose on BMP Neuropathy - Gabapentin 100mg  QHS Seizures - Keppra and Primidone Anxiety -  Xanax 0.25mg  BID PRN, Cymbalta 20mg  daily Thrombocytopenia - improved to 170 over admission Tremors - was on xanax and primidone at home; both d/c given side effects  Issues for follow up 1. Neuro follow up outpatient for essential tremor 2. Abnormal endometrial distension and or fluid at 13 mm - no further evaluation per family 3. Reassess CBC and CMP 4. Consider repeat DVT US of RLE on 11/13 and on 11/22 to ensure no progression of DVT if anticoagulation is discontinued 5. Amlodipine held at discharge, please reassess and adjust as appropriate 6. Consider weaning diltiazem and increasing beta blocker to aid in tremor given she is on digoxin and is predisposed to interactions 7. Follow up iron studies and need for more iron supplementation after GI bleed 8. Started on Torsemide per Cards. Discontinued Lasix, should follow up with Cards on decision on whether to start Lasix.

## 2022-09-30 NOTE — Progress Notes (Signed)
Spoke with granddaughter [name]. Will proceed with CTAP for eval of continued abdominal pain. Will also be able to assess bilateral hips to some degree to evaluate pain. Also informed her of patient's overall improving status and pending SNF placement.

## 2022-09-30 NOTE — Assessment & Plan Note (Addendum)
Patient with suprapubic pain and dark urine from catheter. UA on 10/29 with positive nitrites and large Hgb. UA yesterday 11/8 suspicious for UTI. Ucx pending. Started cefdinir 300 mg BID for UTI. -Continue cefdinir (day 2/5)

## 2022-09-30 NOTE — TOC Progression Note (Signed)
Transition of Care [facility]) - Progression Note    Patient Details  Name: [name] [name] MRN: [id number] Date of Birth: Jan 03, 1930  Transition of Care [facility]) CM/SW [place], [place] Phone Number: 09/30/2022, 2:49 PM  Clinical Narrative:     Spoke with patient's son,[name]- informed [name] confirmed bed offer- family accepted bed offer. Patient will d/c to [name] once medically stable.   TOC will continue to follow and assist discharge planning.  Thurmond Butts, MSW, LCSW Clinical Social Worker    Expected Discharge Plan: Skilled Nursing Facility Barriers to Discharge: Continued Medical Work up  Expected Discharge Plan and Services Expected Discharge Plan: [place] arrangements for the past 2 months: Single Family Home                                       Social Determinants of Health (SDOH) Interventions    Readmission Risk Interventions     No data to display

## 2022-09-30 NOTE — Progress Notes (Signed)
Daily Progress Note Intern Pager: [phone]  Patient name: [name] [name] Medical record number: [id number] Date of birth: 1930-01-23 Age: 88 y.o. Gender: female  Primary Care Provider: Zola Button, MD Consultants: CCM, Cardiology, GI Code Status: Full   Pt Overview and Major Events to Date:  10/23: Admitted to [facility] 10/27: Admitted to [facility]CM 10/29: CT head demonstrated no acute abnormalities 10/29: CXR moderate bilateral pleural effusions, R>L 10/31: TEE/DCCV performed  11/4: Back to FMTS from ICU   Assessment and Plan: [name] [name] is an 88 year old female with hx of HF who was admitted with new onset A-fib with RVR and volume overloaded.  Eventually transferred to the ICU for hypoxic respiratory failure and hospitalization complicated by GI bleed.  Pertinent PMH/PSH includes hypertension, HLD, seizures, essential tremor, diastolic heart failure and CVA.   * Atrial fibrillation with RVR ([place]) Patient on admission was found to have new onset A-fib with RVR.  Received TEE/DCCV on 10/31, s/p cardioversion. Has episode of 1st degree AVB this morning. - Cardiology following, appreciate continued recs - Diltiazem 30mg  QID, Lopressor 50 mg TID, amiodarone 200 mg now qd 11/6, and digoxin 0.0625 mg daily per cardiology - Continue Cardiac monitoring - heparin for DVT ppx, will monitor closely for bleeding and discontinue as appropriate    Acute on chronic diastolic heart failure ([place]) Remains euvolemic on exam, stable on 2L. Down 6.5L since admission.  - Cardiology following, appreciate recs - Strict I's and O's, daily weight - Continue Torsemide 20mg  daily -wean O2 as tolerated  Dark brown-colored urine New since yesterday. Has catheter in place with draining dark urine. Patient endorses suprapubic pain today. UA on 10/29 with positive nitrites and large Hgb. Suspicious for UTI. -Obtain UA and urine culture  Bilateral hip pain Pt chronic leg pain from CVA  which cause  difficulty with ambulation. Also have known degenerative changes of lumbar spine.  Left hip x-ray without acute fracture. Today, she now has right hip and knee pain with passive and active flexion of the joints. Unclear etiology; will consider XR of right hip. Will also be able to see any pathology on CTAP should we obtain the study after speaking with family. For now, PT/OT recs SNF for further rehabilitation.  -DVT US of RLE to r/o DVT, though exam reassuring -F/u need for CTAP as below vs XR for hip   Essential tremor Stable. Still have head tremors with is present at baseline. Home medication includes Xanax and primidone -Holding home Xanax -f/u neuro outpatient  Swelling of right extremity Right arm swelling that started a few days ago, DVT US notable for superficial thrombosis of right basilar vein. Improving, patient remains afebrile.  -warm compress -continue to monitor closely  -consider abx if patient becomes febrile  Abdominal pain Abdominal US normal. Still experiencing abdominal pain today, now with suprapubic tenderness. Miralax and bowel movements have not helped the pain adequately. -continue miralax -consider CT abdomen to evaluate for possible ischemic changes given cardiac history; will discuss with granddaughter [name]  Anemia Likely initially secondary to GI bleed. EGD demonstrated nonbleeding ulcer. Hgb stable. Will continue PPI given GI bleed. -GI following, appreciate recs -Continue daily am CBC -Protonix 40 mg twice daily -monitor for signs of bleeding  -Transfusion threshold <7     FEN/GI: dysphagia 3 diet  PPx: heparin subq Dispo:SNF pending clinical improvement   Subjective:  Still has abdominal pain this morning. Family at bedside and mentions new dark-colored urine. Patient also endorses right leg pain  at flexion of knee and hip that is new and has never occurred before. She had a good bowel movement with miralax.  Objective: Temp:  [97.4 F (36.3  C)-99.4 F (37.4 C)] 97.5 F (36.4 C) (11/06 1204) Pulse Rate:  [62-83] 79 (11/06 1204) Resp:  [16-35] 20 (11/06 1204) BP: (117-129)/(49-74) 119/51 (11/06 1204) SpO2:  [93 %-99 %] 99 % (11/06 1204) Weight:  [82.4 kg] 82.4 kg (11/06 0351) Physical Exam: General: Alert and oriented, in NAD, facial tremulousness, ill appearing Skin: Warm, dry HEENT: NCAT, EOM grossly normal, midline nasal septum Cardiac: RRR, no m/r/g appreciated Respiratory: CTAB anteriorly, breathing and speaking comfortably on RA Abdominal: Soft, TTP throughout and in suprapubic region, nondistended, normoactive bowel sounds Extremities: Moves all extremities grossly equally in bed though has pain with flexion of L knee and hip Neurological: No gross focal deficit Psychiatric: Appropriate mood and affect   Laboratory: Most recent CBC Lab Results  Component Value Date   WBC 11.1 (H) 09/30/2022   HGB 9.1 (L) 09/30/2022   HCT 28.1 (L) 09/30/2022   MCV 94.6 09/30/2022   PLT 158 09/30/2022   Most recent BMP    Latest Ref Rng & Units 09/30/2022   12:55 AM  BMP  Glucose 70 - 99 mg/dL 117   BUN 8 - 23 mg/dL 20   Creatinine 0.44 - 1.00 mg/dL 0.71   Sodium 135 - 145 mmol/L 136   Potassium 3.5 - 5.1 mmol/L 4.1   Chloride 98 - 111 mmol/L 93   CO2 22 - 32 mmol/L 31   Calcium 8.9 - 10.3 mg/dL 8.2    Ethelene Hal, MD 09/30/2022, 1:15 PM PGY-1, [place] Intern pager: [phone], text pages welcome Secure chat group [facility]

## 2022-09-30 NOTE — Consult Note (Signed)
Ref: Zola Button, MD   Subjective:  Awake. No chest pain. T max 99.4 degree F.  Objective:  Vital Signs in the last 24 hours: Temp:  [97.4 F (36.3 C)-99.4 F (37.4 C)] 97.4 F (36.3 C) (11/06 0813) Pulse Rate:  [62-86] 69 (11/06 0813) Cardiac Rhythm: Heart block (11/06 0748) Resp:  [16-35] 16 (11/06 0813) BP: (111-129)/(49-74) 129/54 (11/06 0813) SpO2:  [93 %-100 %] 97 % (11/06 0813) Weight:  [82.4 kg] 82.4 kg (11/06 0351)  Physical Exam: BP Readings from Last 1 Encounters:  09/30/22 (!) 129/54     Wt Readings from Last 1 Encounters:  09/30/22 82.4 kg    Weight change:  Body mass index is 35.48 kg/m. HEENT: [place]/AT, Eyes-Brown, Conjunctiva-Pale, Sclera-Non-icteric Neck: No JVD, No bruit, Trachea midline. Lungs:  Clearing, Bilateral. Cardiac:  Regular rhythm, normal S1 and S2, no S3. II/VI systolic murmur. Abdomen:  Soft, non-tender. BS present. Extremities:  No edema present. No cyanosis. No clubbing. CNS: AxOx3, Cranial nerves grossly intact, moves all 4 extremities.  Skin: Warm and dry.   Intake/Output from previous day: 11/05 0701 - 11/06 0700 In: 100 [P.O.:100] Out: 1650 [Urine:1650]    Lab Results: BMET    Component Value Date/Time   NA 136 09/30/2022 0055   NA 137 09/29/2022 0134   NA 138 09/28/2022 0114   NA 141 06/14/2022 1505   NA 141 04/26/2020 1708   NA 141 06/22/2018 1448   K 4.1 09/30/2022 0055   K 4.1 09/29/2022 0134   K 4.1 09/28/2022 0114   CL 93 (L) 09/30/2022 0055   CL 95 (L) 09/29/2022 0134   CL 96 (L) 09/28/2022 0114   CO2 31 09/30/2022 0055   CO2 34 (H) 09/29/2022 0134   CO2 35 (H) 09/28/2022 0114   GLUCOSE 117 (H) 09/30/2022 0055   GLUCOSE 129 (H) 09/29/2022 0134   GLUCOSE 132 (H) 09/28/2022 0114   BUN 20 09/30/2022 0055   BUN 18 09/29/2022 0134   BUN 17 09/28/2022 0114   BUN 17 06/14/2022 1505   BUN 19 04/26/2020 1708   BUN 14 06/22/2018 1448   CREATININE 0.71 09/30/2022 0055   CREATININE 0.71 09/29/2022 0134    CREATININE 0.60 09/28/2022 0114   CALCIUM 8.2 (L) 09/30/2022 0055   CALCIUM 8.3 (L) 09/29/2022 0134   CALCIUM 8.5 (L) 09/28/2022 0114   GFRNONAA >60 09/30/2022 0055   GFRNONAA >60 09/29/2022 0134   GFRNONAA >60 09/28/2022 0114   GFRAA 69 04/26/2020 1708   GFRAA >60 06/15/2019 0841   GFRAA >60 11/13/2018 1941   CBC    Component Value Date/Time   WBC 11.1 (H) 09/30/2022 0055   RBC 2.97 (L) 09/30/2022 0055   HGB 9.1 (L) 09/30/2022 0055   HGB 15.2 04/26/2020 1708   HCT 28.1 (L) 09/30/2022 0055   HCT 46.5 04/26/2020 1708   PLT 158 09/30/2022 0055   PLT 102 (L) 04/26/2020 1708   MCV 94.6 09/30/2022 0055   MCV 94 04/26/2020 1708   MCH 30.6 09/30/2022 0055   MCHC 32.4 09/30/2022 0055   RDW 14.9 09/30/2022 0055   RDW 12.8 04/26/2020 1708   LYMPHSABS 1.4 09/29/2022 0134   LYMPHSABS 1.6 04/26/2020 1708   MONOABS 0.9 09/29/2022 0134   EOSABS 0.1 09/29/2022 0134   EOSABS 0.0 04/26/2020 1708   BASOSABS 0.1 09/29/2022 0134   BASOSABS 0.0 04/26/2020 1708   HEPATIC Function Panel Recent Labs    09/28/22 0114 09/29/22 0134 09/30/22 0055  PROT 5.9* 5.7* 5.9*  ALBUMIN 2.8* 2.7* 2.6*  AST 100* 125* 144*  ALT 123* 142* 163*  ALKPHOS 84 93 89   HEMOGLOBIN A1C Lab Results  Component Value Date   MPG 126 (H) 07/18/2014   CARDIAC ENZYMES Lab Results  Component Value Date   TROPONINI <0.30 03/24/2012   BNP No results for input(s): "PROBNP" in the last 8760 hours. TSH Recent Labs    09/16/22 1929  TSH 1.792   CHOLESTEROL Recent Labs    06/14/22 1505 09/17/22 0207  CHOL 180 161    Scheduled Meds:  amiodarone  200 mg Oral Daily   atorvastatin  20 mg Oral QPM   Chlorhexidine Gluconate Cloth  6 each Topical Daily   cycloSPORINE  1 drop Both Eyes BID   digoxin  0.0625 mg Oral Daily   diltiazem  30 mg Oral Q8H   DULoxetine  20 mg Oral Daily   fentaNYL (SUBLIMAZE) injection  100 mcg Intravenous Once   gabapentin  100 mg Oral QHS   heparin injection (subcutaneous)   5,000 Units Subcutaneous Q8H   lactose free nutrition  237 mL Oral TID WC   levETIRAcetam  500 mg Oral BID   metoprolol tartrate  25 mg Oral BID   mouth rinse  15 mL Mouth Rinse 4 times per day   pantoprazole  40 mg Oral BID   polyethylene glycol  17 g Oral Daily   potassium chloride  10 mEq Oral BID   torsemide  20 mg Oral Daily   Continuous Infusions:  sodium chloride Stopped (09/26/22 1457)   PRN Meds:.diclofenac Sodium, guaiFENesin, mouth rinse, senna, simethicone  Assessment/Plan: Acute on chronic diastolic left heart failure Acute respiratory failure with hypoxia, improving Paroxysmal atrial fibrillation S/P cardioversion Recent GI bled An[place]ai of blood loss Gastric ulcer HTN HLD Old stroke Obesity Arthritis Parkinson's disease  Plan: Increase activity.  Decrease amiodarone to 200 mg. Daily. PT/OT evaluation   LOS: 13 days   Time spent including chart review, lab review, examination, discussion with patient/Nurse/Family : 30 min   Dixie Dials  MD  09/30/2022, 8:59 AM

## 2022-10-01 ENCOUNTER — Encounter (HOSPITAL_COMMUNITY): Payer: Medicare Other

## 2022-10-01 DIAGNOSIS — R0602 Shortness of breath: Secondary | ICD-10-CM | POA: Diagnosis not present

## 2022-10-01 DIAGNOSIS — I4891 Unspecified atrial fibrillation: Secondary | ICD-10-CM | POA: Diagnosis not present

## 2022-10-01 LAB — HEPATITIS PANEL, ACUTE
HCV Ab: NONREACTIVE
Hep A IgM: NONREACTIVE
Hep B C IgM: NONREACTIVE
Hepatitis B Surface Ag: NONREACTIVE

## 2022-10-01 LAB — CBC WITH DIFFERENTIAL/PLATELET
Abs Immature Granulocytes: 0.06 10*3/uL (ref 0.00–0.07)
Basophils Absolute: 0.1 10*3/uL (ref 0.0–0.1)
Basophils Relative: 1 %
Eosinophils Absolute: 0.2 10*3/uL (ref 0.0–0.5)
Eosinophils Relative: 2 %
HCT: 28.5 % — ABNORMAL LOW (ref 36.0–46.0)
Hemoglobin: 9.4 g/dL — ABNORMAL LOW (ref 12.0–15.0)
Immature Granulocytes: 1 %
Lymphocytes Relative: 15 %
Lymphs Abs: 1.5 10*3/uL (ref 0.7–4.0)
MCH: 30.9 pg (ref 26.0–34.0)
MCHC: 33 g/dL (ref 30.0–36.0)
MCV: 93.8 fL (ref 80.0–100.0)
Monocytes Absolute: 0.8 10*3/uL (ref 0.1–1.0)
Monocytes Relative: 8 %
Neutro Abs: 7.3 10*3/uL (ref 1.7–7.7)
Neutrophils Relative %: 73 %
Platelets: 172 10*3/uL (ref 150–400)
RBC: 3.04 MIL/uL — ABNORMAL LOW (ref 3.87–5.11)
RDW: 14.8 % (ref 11.5–15.5)
WBC: 9.9 10*3/uL (ref 4.0–10.5)
nRBC: 0 % (ref 0.0–0.2)

## 2022-10-01 LAB — COMPREHENSIVE METABOLIC PANEL
ALT: 191 U/L — ABNORMAL HIGH (ref 0–44)
AST: 159 U/L — ABNORMAL HIGH (ref 15–41)
Albumin: 2.6 g/dL — ABNORMAL LOW (ref 3.5–5.0)
Alkaline Phosphatase: 96 U/L (ref 38–126)
Anion gap: 14 (ref 5–15)
BUN: 16 mg/dL (ref 8–23)
CO2: 32 mmol/L (ref 22–32)
Calcium: 8.5 mg/dL — ABNORMAL LOW (ref 8.9–10.3)
Chloride: 89 mmol/L — ABNORMAL LOW (ref 98–111)
Creatinine, Ser: 0.67 mg/dL (ref 0.44–1.00)
GFR, Estimated: >60 mL/min (ref >60)
Glucose, Bld: 121 mg/dL — ABNORMAL HIGH (ref 70–99)
Potassium: 4.4 mmol/L (ref 3.5–5.1)
Sodium: 135 mmol/L (ref 135–145)
Total Bilirubin: 0.6 mg/dL (ref 0.3–1.2)
Total Protein: 5.9 g/dL — ABNORMAL LOW (ref 6.5–8.1)

## 2022-10-01 MED ORDER — ADULT MULTIVITAMIN W/MINERALS CH
1.0000 | ORAL_TABLET | Freq: Every day | ORAL | Status: DC
  Administered 2022-10-01 – 2022-10-04 (×4): 1 via ORAL
  Filled 2022-10-01 (×4): qty 1

## 2022-10-01 MED ORDER — MAGNESIUM OXIDE -MG SUPPLEMENT 400 (240 MG) MG PO TABS
400.0000 mg | ORAL_TABLET | Freq: Every day | ORAL | Status: DC
  Administered 2022-10-01 – 2022-10-04 (×4): 400 mg via ORAL
  Filled 2022-10-01 (×4): qty 1

## 2022-10-01 NOTE — Progress Notes (Signed)
Daily Progress Note Intern Pager: [phone]  Patient name: [name] [name] Medical record number: [id number] Date of birth: 1930-05-28 Age: 88 y.o. Gender: female  Primary Care Provider: Zola Button, MD Consultants: CCM, Cardiology, GI Code Status: Full   Pt Overview and Major Events to Date:  10/23: Admitted to [facility] 10/27: Admitted to CCM 10/29: CT head demonstrated no acute abnormalities 10/29: CXR moderate bilateral pleural effusions, R>L 10/31: TEE/DCCV performed  11/4: Back to FMTS from ICU   Assessment and Plan: [name] is an 88 year old female with hx of HF who was admitted with new onset A-fib with RVR and volume overloaded.  Eventually transferred to the ICU for hypoxic respiratory failure and hospitalization complicated by GI bleed.  Pertinent PMH/PSH includes hypertension, HLD, seizures, essential tremor, diastolic heart failure and CVA.   * Atrial fibrillation with RVR ([place]) Patient on admission was found to have new onset A-fib with RVR.  Received TEE/DCCV on 10/31, s/p cardioversion. Rate controlled. - Cardiology following, appreciate continued recs - Diltiazem 30mg  QID, Lopressor 50 mg TID, amiodarone 200 mg now qd 11/6, and digoxin 0.0625 mg daily per cardiology - Continue Cardiac monitoring - heparin for DVT ppx, will monitor closely for bleeding and discontinue as appropriate    Acute on chronic diastolic heart failure ([facility]) Remains euvolemic on exam, stable on 2L **. Down 6.5L since admission.  - Cardiology following, appreciate recs - Strict I's and O's, daily weight - Continue Torsemide 20mg  daily -wean O2 as tolerated  Dark brown-colored urine New since yesterday. Has catheter in place with draining dark urine. Patient endorses suprapubic pain today. UA on 10/29 with positive nitrites and large Hgb. Suspicious for UTI. -F/u UA and urine culture  Bilateral hip pain CTAP pending to evaluate for abdominal pain, though will be able to see  bilateral hips. Suspect pain 2/2 deconditioning vs post-CVA. DVT US pending but exam remains unremarkable for DVT; she still has tenderness with palpation of the R knee and passive flexion of knee and hip. CTAP without focal MSK findings. -F/u DVT US of RLE to r/o DVT -OOB with PT/OT for conditioning   Essential tremor Stable. Still have head tremors with is present at baseline. Home medication includes Xanax and primidone -Holding home Xanax -f/u neuro outpatient  Swelling of right extremity Right arm swelling that started a few days ago, DVT US notable for superficial thrombosis of right basilar vein. Thrombosis should resolve on its own over time, though will try conservative management until then. -will trial alternating cold and warm compress -apply voltaren gel to area for pain control -continue to monitor closely  -consider abx if patient becomes febrile  Abdominal pain Abdominal US normal. Still experiencing abdominal pain, now with suprapubic tenderness. CTAP with findings suggestive of ileus and endometrial distention. Of note, LFTs have been persistently elevated over admission. RUQ [place] was obtained and showed normal gallbladder and common bile duct, so do not suspect this is cause for abdominal pain. LFTs could also be elevated 2/2 amiodarone. -continue miralax -OOB with PT/OT to mobilize bowel contents -Order hepatitis panel and message cardiology about amiodarone and LFTs; if no new information, consider re consult to GI  Anemia Likely initially secondary to GI bleed. EGD demonstrated nonbleeding ulcer. Hgb stable. Will continue PPI given GI bleed. -GI following, appreciate recs -Continue daily am CBC -Protonix 40 mg twice daily -monitor for signs of bleeding  -Transfusion threshold <7     FEN/GI: dysphagia 3 diet  PPx: heparin subq  Dispo:SNF pending clinical improvement   Subjective:  Continues to have abdominal pain, right LE pain, and pain on right arm. Family  would like PT/OT to see again and help her out of bed.   Objective: Temp:  [97.5 F (36.4 C)-98.4 F (36.9 C)] 98.4 F (36.9 C) (11/07 1206) Pulse Rate:  [61-71] 68 (11/07 1206) Resp:  [18-22] 18 (11/07 1206) BP: (109-153)/(41-66) 109/66 (11/07 1206) SpO2:  [96 %-99 %] 97 % (11/07 1206) Weight:  [79.9 kg] 79.9 kg (11/07 0001) Physical Exam: General: Alert and oriented, in NAD Skin: Warm, dry; erythematous, firm swelling of R forearm that is painful to palpation HEENT: NCAT, EOM grossly normal, midline nasal septum Cardiac: RRR, no m/r/g appreciated Respiratory: CTAB anteriorly, breathing and speaking comfortably on RA Abdominal: Soft, TTP throughout, nondistended, normoactive bowel sounds Neurological: No gross focal deficit Psychiatric: Appropriate mood and affect   Laboratory: Most recent CBC Lab Results  Component Value Date   WBC 9.9 10/01/2022   HGB 9.4 (L) 10/01/2022   HCT 28.5 (L) 10/01/2022   MCV 93.8 10/01/2022   PLT 172 10/01/2022   Most recent BMP    Latest Ref Rng & Units 10/01/2022    6:44 AM  BMP  Glucose 70 - 99 mg/dL 121   BUN 8 - 23 mg/dL 16   Creatinine 0.44 - 1.00 mg/dL 0.67   Sodium 135 - 145 mmol/L 135   Potassium 3.5 - 5.1 mmol/L 4.4   Chloride 98 - 111 mmol/L 89   CO2 22 - 32 mmol/L 32   Calcium 8.9 - 10.3 mg/dL 8.5    Ethelene Hal, MD 10/01/2022, 12:51 PM PGY-1, [place] Intern pager: [phone], text pages welcome Secure chat group [place]

## 2022-10-01 NOTE — Consult Note (Signed)
Ref: Zola Button, MD   Subjective:  Awake. Eager to increase activity. CT abdomen suggestive of colonic ileus, moderate right pleural effusion and moderate umbilical hernia.  Objective:  Vital Signs in the last 24 hours: Temp:  [97.5 F (36.4 C)-97.8 F (36.6 C)] 97.6 F (36.4 C) (11/07 0819) Pulse Rate:  [61-79] 64 (11/07 0819) Cardiac Rhythm: Heart block (11/07 0836) Resp:  [20-22] 22 (11/07 0819) BP: (115-153)/(41-58) 115/44 (11/07 0819) SpO2:  [96 %-99 %] 97 % (11/07 0819) Weight:  [79.9 kg] 79.9 kg (11/07 0001)  Physical Exam: BP Readings from Last 1 Encounters:  10/01/22 (!) 115/44     Wt Readings from Last 1 Encounters:  10/01/22 79.9 kg    Weight change: -2.5 kg Body mass index is 34.4 kg/m. HEENT: [place]/AT, Eyes-Brown, Conjunctiva-Pale pink, Sclera-Non-icteric Neck: No JVD, No bruit, Trachea midline. Lungs:  Clear, Bilateral. Cardiac:  Regular rhythm, normal S1 and S2, no S3. II/VI systolic murmur. Abdomen:  Soft, non-tender. BS present. Extremities:  No edema present. No cyanosis. No clubbing. CNS: AxOx3, Cranial nerves grossly intact, moves all 4 extremities.  Skin: Warm and dry.   Intake/Output from previous day: 11/06 0701 - 11/07 0700 In: -  Out: 1600 [Urine:1600]    Lab Results: BMET    Component Value Date/Time   NA 135 10/01/2022 0644   NA 136 09/30/2022 0055   NA 137 09/29/2022 0134   NA 141 06/14/2022 1505   NA 141 04/26/2020 1708   NA 141 06/22/2018 1448   K 4.4 10/01/2022 0644   K 4.1 09/30/2022 0055   K 4.1 09/29/2022 0134   CL 89 (L) 10/01/2022 0644   CL 93 (L) 09/30/2022 0055   CL 95 (L) 09/29/2022 0134   CO2 32 10/01/2022 0644   CO2 31 09/30/2022 0055   CO2 34 (H) 09/29/2022 0134   GLUCOSE 121 (H) 10/01/2022 0644   GLUCOSE 117 (H) 09/30/2022 0055   GLUCOSE 129 (H) 09/29/2022 0134   BUN 16 10/01/2022 0644   BUN 20 09/30/2022 0055   BUN 18 09/29/2022 0134   BUN 17 06/14/2022 1505   BUN 19 04/26/2020 1708   BUN 14 06/22/2018  1448   CREATININE 0.67 10/01/2022 0644   CREATININE 0.71 09/30/2022 0055   CREATININE 0.71 09/29/2022 0134   CALCIUM 8.5 (L) 10/01/2022 0644   CALCIUM 8.2 (L) 09/30/2022 0055   CALCIUM 8.3 (L) 09/29/2022 0134   GFRNONAA >60 10/01/2022 0644   GFRNONAA >60 09/30/2022 0055   GFRNONAA >60 09/29/2022 0134   GFRAA 69 04/26/2020 1708   GFRAA >60 06/15/2019 0841   GFRAA >60 11/13/2018 1941   CBC    Component Value Date/Time   WBC 9.9 10/01/2022 0644   RBC 3.04 (L) 10/01/2022 0644   HGB 9.4 (L) 10/01/2022 0644   HGB 15.2 04/26/2020 1708   HCT 28.5 (L) 10/01/2022 0644   HCT 46.5 04/26/2020 1708   PLT 172 10/01/2022 0644   PLT 102 (L) 04/26/2020 1708   MCV 93.8 10/01/2022 0644   MCV 94 04/26/2020 1708   MCH 30.9 10/01/2022 0644   MCHC 33.0 10/01/2022 0644   RDW 14.8 10/01/2022 0644   RDW 12.8 04/26/2020 1708   LYMPHSABS 1.5 10/01/2022 0644   LYMPHSABS 1.6 04/26/2020 1708   MONOABS 0.8 10/01/2022 0644   EOSABS 0.2 10/01/2022 0644   EOSABS 0.0 04/26/2020 1708   BASOSABS 0.1 10/01/2022 0644   BASOSABS 0.0 04/26/2020 1708   HEPATIC Function Panel Recent Labs    09/29/22 0134  09/30/22 0055 10/01/22 0644  PROT 5.7* 5.9* 5.9*  ALBUMIN 2.7* 2.6* 2.6*  AST 125* 144* 159*  ALT 142* 163* 191*  ALKPHOS 93 89 96   HEMOGLOBIN A1C Lab Results  Component Value Date   MPG 126 (H) 07/18/2014   CARDIAC ENZYMES Lab Results  Component Value Date   TROPONINI <0.30 03/24/2012   BNP No results for input(s): "PROBNP" in the last 8760 hours. TSH Recent Labs    09/16/22 1929  TSH 1.792   CHOLESTEROL Recent Labs    06/14/22 1505 09/17/22 0207  CHOL 180 161    Scheduled Meds:  amiodarone  200 mg Oral Daily   atorvastatin  20 mg Oral QPM   Chlorhexidine Gluconate Cloth  6 each Topical Daily   cycloSPORINE  1 drop Both Eyes BID   digoxin  0.0625 mg Oral Daily   diltiazem  30 mg Oral Q8H   DULoxetine  20 mg Oral Daily   fentaNYL (SUBLIMAZE) injection  100 mcg Intravenous  Once   gabapentin  100 mg Oral QHS   heparin injection (subcutaneous)  5,000 Units Subcutaneous Q8H   lactose free nutrition  237 mL Oral TID WC   levETIRAcetam  500 mg Oral BID   magnesium oxide  400 mg Oral Daily   metoprolol tartrate  25 mg Oral BID   multivitamin with minerals  1 tablet Oral Daily   mouth rinse  15 mL Mouth Rinse 4 times per day   pantoprazole  40 mg Oral BID   polyethylene glycol  17 g Oral Daily   potassium chloride  10 mEq Oral BID   torsemide  20 mg Oral Daily   Continuous Infusions:  sodium chloride Stopped (09/26/22 1457)   PRN Meds:.diclofenac Sodium, guaiFENesin, mouth rinse, senna, simethicone  Assessment/Plan: Acute on chronic diastolic left heart failure, stable Acute respiratory failure with hypoxia, improving Paroxysmal atrial fibrillation S/P cardioversion to sinus rhythm Abdominal pain Colonic ileus Recent GI bleed Anemia of blood loss Gastric ulcer HTN HLD Old stroke Obesity Arthritis Parkinson's disease  Plan: Add Multivitamin + oral magnesium. Patient willing to do exercise in bed, like raising arms, flexing arms and flexing legs for 1 minute several times a day.   LOS: 14 days   Time spent including chart review, lab review, examination, discussion with patient/Family : 30 min   Dixie Dials  MD  10/01/2022, 9:39 AM

## 2022-10-01 NOTE — Progress Notes (Addendum)
Physical Therapy Treatment Patient Details Name: [name] [name] MRN: [id number] DOB: Apr 11, 1930 Today's Date: 10/01/2022   History of Present Illness Pt is an 88 y.o. female admitted 09/16/22 with worsening BLE weakness, SOB. Pt with new onset AFib with RVR as well as new CHF and GIB. Bilateral pleural effusions R>L found 10/29. S/P TEE/DCCV 10/31. Tx to ICU due to GIB. PMH includes HTN, DVT, DM, essential tremor, stroke (2015), anxiety.    PT Comments    Pt making steady progress towards her physical therapy goals; exhibits improved activity tolerance and is motivated to participate. Pt family present and supportive. Session focused on Stedy transfers for functional strengthening and pre transfer training. Pt able to complete x 6 trials with two person moderate assist. Worked on seated exercises for BLE strengthening. Pt will benefit from post acute rehab to address deficits and maximize functional mobility.   Recommendations for follow up therapy are one component of a multi-disciplinary discharge planning process, led by the attending physician.  Recommendations may be updated based on patient status, additional functional criteria and insurance authorization.  Follow Up Recommendations  Skilled nursing-short term rehab (<3 hours/day) Can patient physically be transported by private vehicle: No   Assistance Recommended at Discharge Frequent or constant Supervision/Assistance  Patient can return home with the following Two people to help with walking and/or transfers;Two people to help with bathing/dressing/bathroom;Direct supervision/assist for medications management;Assistance with feeding;Assist for transportation;Assistance with cooking/housework;Direct supervision/assist for financial management   Equipment Recommendations  Hospital bed;Wheelchair (measurements PT);BSC/3in1    Recommendations for Other Services       Precautions / Restrictions Precautions Precautions:  Fall;Other (comment) Precaution Comments: Watch O2 Restrictions Weight Bearing Restrictions: No     Mobility  Bed Mobility Overal bed mobility: Needs Assistance Bed Mobility: Supine to Sit     Supine to sit: Mod assist, +2 for physical assistance     General bed mobility comments: Cues for initiation, use of bed pad to scoot hips forward, assist at trunk to elevate to sitting    Transfers Overall transfer level: Needs assistance Equipment used: Ambulation equipment used Transfers: Sit to/from Stand Sit to Stand: Mod assist, +2 physical assistance           General transfer comment: ModA + 2 to stand from [place] x 6. Cues for pushing through BLE's, scapular retraction, looking up Transfer via Lift Equipment: Stedy  Ambulation/Gait                   Stairs             Wheelchair Mobility    Modified Rankin (Stroke Patients Only)       Balance Overall balance assessment: Needs assistance Sitting-balance support: Feet supported, No upper extremity supported Sitting balance-Leahy Scale: Fair                                      Cognition Arousal/Alertness: Awake/alert Behavior During Therapy: WFL for tasks assessed/performed Overall Cognitive Status: Difficult to assess                                 General Comments: Pleasant and motivated        Exercises General Exercises - Lower Extremity Ankle Circles/Pumps: Both, 10 reps, Seated Long Arc Quad: Both, 10 reps, Seated Hip ABduction/ADduction: Both, 10 reps, Seated Hip Flexion/Marching:  Both, 10 reps, Seated    General Comments        Pertinent Vitals/Pain Pain Assessment Pain Assessment: No/denies pain    Home Living                          Prior Function            PT Goals (current goals can now be found in the care plan section) Acute Rehab PT Goals Patient Stated Goal: continue to work with therapy Potential to Achieve Goals:  Fair Progress towards PT goals: Progressing toward goals    Frequency    Min 3X/week      PT Plan Current plan remains appropriate    Co-evaluation PT/OT/SLP Co-Evaluation/Treatment: Yes Reason for Co-Treatment: For patient/therapist safety;To address functional/ADL transfers PT goals addressed during session: Mobility/safety with mobility        AM-PAC PT "6 Clicks" Mobility   Outcome Measure  Help needed turning from your back to your side while in a flat bed without using bedrails?: A Lot Help needed moving from lying on your back to sitting on the side of a flat bed without using bedrails?: A Lot Help needed moving to and from a bed to a chair (including a wheelchair)?: Total Help needed standing up from a chair using your arms (e.g., wheelchair or bedside chair)?: Total Help needed to walk in hospital room?: Total Help needed climbing 3-5 steps with a railing? : Total 6 Click Score: 8    End of Session Equipment Utilized During Treatment: Gait belt;Oxygen Activity Tolerance: Patient tolerated treatment well Patient left: in chair;with call bell/phone within reach;with family/visitor present Nurse Communication: Mobility status PT Visit Diagnosis: Unsteadiness on feet (R26.81);Muscle weakness (generalized) (M62.81);Difficulty in walking, not elsewhere classified (R26.2)     Time: [id number] PT Time Calculation (min) (ACUTE ONLY): 26 min  Charges:  $Therapeutic Activity: 8-22 mins                     Wyona Almas, PT, DPT Acute Rehabilitation Services Office [phone]    Deno Etienne 10/01/2022, 12:02 PM

## 2022-10-01 NOTE — Progress Notes (Signed)
FMTS Interim Progress Note  Spoke with the patient's granddaughter [name]. We spoke about multiple concerns:  We will forego additional imaging for endometrial thickening on CTAP due to patient's age and clinical status. We will talk with pulmonary about her bilateral pleural effusions and possibly of tap. She will speak with the rest of her family about their wishes for this, as this would likely be a painful procedure for her grandmother. She would like to get admitted to CIR if possible, though she would not like to lose the SNF bed. If CIR is not an option, they would like to take their existing SNF bed. I have messaged CIR coordinator about this. I have contacted her cardiologist, Dr. Doylene Canard, about her elevated LFTs. They have been uptrending since the addition of amiodarone, and I am curious what they think. Her LFTs were mildly elevated on admission, however. Abdominal US was unremarkable. We will follow up a hepatitis panel. Jeneen Montgomery is in agreement.  Ethelene Hal, MD 10/01/2022, 1:19 PM PGY-1, [place] Medicine Service pager [phone]

## 2022-10-01 NOTE — Progress Notes (Addendum)
  Inpatient Rehab Admissions Coordinator :  Per therapy change in recommendations, patient was screened for CIR candidacy by Danne Baxter RN MSN.  Noted they have accepted SNF bed at [facility] 11/6. I will hold placing rehab consult unless pt/family wish to have consult to assess for CIR admit. Please advise.  Danne Baxter RN MSN Admissions Coordinator [phone]

## 2022-10-01 NOTE — Progress Notes (Signed)
Occupational Therapy Treatment Patient Details Name: [name] [name] MRN: [id number] DOB: 21-Jul-1930 Today's Date: 10/01/2022   History of present illness Pt is an 88 y.o. female admitted 09/16/22 with worsening BLE weakness, SOB. Pt with new onset AFib with RVR as well as new CHF and GIB. Bilateral pleural effusions R>L found 10/29. S/P TEE/DCCV 10/31. Tx to ICU due to GIB. PMH includes HTN, DVT, DM, essential tremor, stroke (2015), anxiety.   OT comments  Patient supine in bed and agreeable to OT/PT session.  Completing bed mobility with mod assist +2, transfers into standing using stedy frame with mod assist +2.  Sitting EOB with min guard, requires total assist for LB dressing and toileting. Continues to be limited by tremors, weakness and decreased activity tolerance.  Remains highly motivated.  Family present to interpret.  Will follow acutely.    Recommendations for follow up therapy are one component of a multi-disciplinary discharge planning process, led by the attending physician.  Recommendations may be updated based on patient status, additional functional criteria and insurance authorization.    Follow Up Recommendations  Skilled nursing-short term rehab (<3 hours/[name])    Assistance Recommended at Discharge Frequent or constant Supervision/Assistance  Patient can return home with the following  Assistance with cooking/housework;Direct supervision/assist for financial management;Assistance with feeding;Direct supervision/assist for medications management;Assist for transportation;Help with stairs or ramp for entrance;Two people to help with walking and/or transfers;Two people to help with bathing/dressing/bathroom   Equipment Recommendations  Other (comment) (defer)    Recommendations for Other Services      Precautions / Restrictions Precautions Precautions: Fall;Other (comment) Precaution Comments: Watch O2 Restrictions Weight Bearing Restrictions: No       Mobility  Bed Mobility Overal bed mobility: Needs Assistance Bed Mobility: Supine to Sit     Supine to sit: Mod assist, +2 for physical assistance     General bed mobility comments: Cues for initiation, use of bed pad to scoot hips forward, assist at trunk to elevate to sitting    Transfers Overall transfer level: Needs assistance Equipment used: Ambulation equipment used Transfers: Sit to/from Stand Sit to Stand: Mod assist, +2 physical assistance           General transfer comment: ModA + 2 to stand from [place] x 6. Cues for pushing through BLE's, scapular retraction, looking up Transfer via Lift Equipment: Stedy   Balance Overall balance assessment: Needs assistance Sitting-balance support: Feet supported, No upper extremity supported Sitting balance-Leahy Scale: Fair Sitting balance - Comments: min guard sitting EOB   Standing balance support: Bilateral upper extremity supported, During functional activity Standing balance-Leahy Scale: Zero Standing balance comment: assist of 2 to stand in stedy                           ADL either performed or assessed with clinical judgement   ADL Overall ADL's : Needs assistance/impaired     Grooming: Wash/dry face;Set up;Sitting               Lower Body Dressing: Total assistance;+2 for physical assistance;+2 for safety/equipment;Sit to/from stand   Toilet Transfer: Total assistance;+2 for physical assistance;+2 for safety/equipment Toilet Transfer Details (indicate cue type and reason): using stedy to recliner Toileting- Clothing Manipulation and Hygiene: Total assistance;+2 for safety/equipment;Sit to/from stand       Functional mobility during ADLs: Moderate assistance;+2 for physical assistance;+2 for safety/equipment      Extremity/Trunk Assessment  Vision       Perception     Praxis      Cognition Arousal/Alertness: Awake/alert Behavior During Therapy: WFL for tasks  assessed/performed Overall Cognitive Status: Difficult to assess                                 General Comments: Pleasant and motivated, follows simple commands with increased time.  Family assisted with translation as needed.        Exercises      Shoulder Instructions       General Comments VSS on 2L [place]    Pertinent Vitals/ Pain       Pain Assessment Pain Assessment: No/denies pain  Home Living                                          Prior Functioning/Environment              Frequency  Min 2X/week        Progress Toward Goals  OT Goals(current goals can now be found in the care plan section)  Progress towards OT goals: Progressing toward goals  Acute Rehab OT Goals Patient Stated Goal: rehab OT Goal Formulation: With patient/family Time For Goal Achievement: 10/09/22 Potential to Achieve Goals: Good  Plan Discharge plan remains appropriate;Frequency remains appropriate    Co-evaluation    PT/OT/SLP Co-Evaluation/Treatment: Yes Reason for Co-Treatment: To address functional/ADL transfers;For patient/therapist safety PT goals addressed during session: Mobility/safety with mobility OT goals addressed during session: ADL's and self-care      AM-PAC OT "6 Clicks" Daily Activity     Outcome Measure   Help from another person eating meals?: A Lot Help from another person taking care of personal grooming?: A Lot Help from another person toileting, which includes using toliet, bedpan, or urinal?: A Lot Help from another person bathing (including washing, rinsing, drying)?: A Lot Help from another person to put on and taking off regular upper body clothing?: A Lot Help from another person to put on and taking off regular lower body clothing?: Total 6 Click Score: 11    End of Session Equipment Utilized During Treatment: Oxygen  OT Visit Diagnosis: Unsteadiness on feet (R26.81);Other abnormalities of gait and mobility  (R26.89);Muscle weakness (generalized) (M62.81);Pain   Activity Tolerance Patient tolerated treatment well   Patient Left in chair;with call bell/phone within reach;with chair alarm set;with family/visitor present;with nursing/sitter in room   Nurse Communication Mobility status        Time: 3329-5188 OT Time Calculation (min): 26 min  Charges: OT General Charges $OT Visit: 1 Visit OT Treatments $Self Care/Home Management : 8-22 mins  Jolaine Artist, [place] Office [phone]   Delight Stare 10/01/2022, 12:24 PM

## 2022-10-02 ENCOUNTER — Inpatient Hospital Stay (HOSPITAL_COMMUNITY): Payer: Medicare Other

## 2022-10-02 DIAGNOSIS — I4891 Unspecified atrial fibrillation: Secondary | ICD-10-CM | POA: Diagnosis not present

## 2022-10-02 DIAGNOSIS — M79604 Pain in right leg: Secondary | ICD-10-CM | POA: Diagnosis not present

## 2022-10-02 DIAGNOSIS — J90 Pleural effusion, not elsewhere classified: Secondary | ICD-10-CM | POA: Diagnosis not present

## 2022-10-02 LAB — URINALYSIS, ROUTINE W REFLEX MICROSCOPIC
Bilirubin Urine: NEGATIVE
Glucose, UA: NEGATIVE mg/dL
Ketones, ur: NEGATIVE mg/dL
Nitrite: NEGATIVE
Protein, ur: NEGATIVE mg/dL
Specific Gravity, Urine: 1.005 (ref 1.005–1.030)
pH: 7 (ref 5.0–8.0)

## 2022-10-02 LAB — COMPREHENSIVE METABOLIC PANEL
ALT: 197 U/L — ABNORMAL HIGH (ref 0–44)
AST: 149 U/L — ABNORMAL HIGH (ref 15–41)
Albumin: 2.6 g/dL — ABNORMAL LOW (ref 3.5–5.0)
Alkaline Phosphatase: 95 U/L (ref 38–126)
Anion gap: 8 (ref 5–15)
BUN: 18 mg/dL (ref 8–23)
CO2: 33 mmol/L — ABNORMAL HIGH (ref 22–32)
Calcium: 8.5 mg/dL — ABNORMAL LOW (ref 8.9–10.3)
Chloride: 93 mmol/L — ABNORMAL LOW (ref 98–111)
Creatinine, Ser: 0.66 mg/dL (ref 0.44–1.00)
GFR, Estimated: >60 mL/min (ref >60)
Glucose, Bld: 112 mg/dL — ABNORMAL HIGH (ref 70–99)
Potassium: 4.4 mmol/L (ref 3.5–5.1)
Sodium: 134 mmol/L — ABNORMAL LOW (ref 135–145)
Total Bilirubin: 0.4 mg/dL (ref 0.3–1.2)
Total Protein: 5.8 g/dL — ABNORMAL LOW (ref 6.5–8.1)

## 2022-10-02 MED ORDER — METOPROLOL SUCCINATE ER 50 MG PO TB24
50.0000 mg | ORAL_TABLET | Freq: Every evening | ORAL | Status: DC
  Administered 2022-10-02 – 2022-10-03 (×2): 50 mg via ORAL
  Filled 2022-10-02 (×2): qty 1

## 2022-10-02 MED ORDER — ALBUMIN HUMAN 5 % IV SOLN
12.5000 g | Freq: Once | INTRAVENOUS | Status: AC
  Administered 2022-10-02: 12.5 g via INTRAVENOUS
  Filled 2022-10-02: qty 250

## 2022-10-02 MED ORDER — DILTIAZEM HCL ER COATED BEADS 120 MG PO CP24: 120.0000 mg | ORAL_CAPSULE | Freq: Every day | ORAL | Status: DC

## 2022-10-02 MED ORDER — CEFDINIR 300 MG PO CAPS
300.0000 mg | ORAL_CAPSULE | Freq: Two times a day (BID) | ORAL | Status: DC
  Administered 2022-10-02 – 2022-10-04 (×4): 300 mg via ORAL
  Filled 2022-10-02 (×4): qty 1

## 2022-10-02 MED ORDER — DILTIAZEM HCL 60 MG PO TABS
30.0000 mg | ORAL_TABLET | Freq: Three times a day (TID) | ORAL | Status: DC
  Administered 2022-10-02 – 2022-10-04 (×8): 30 mg via ORAL
  Filled 2022-10-02 (×8): qty 1

## 2022-10-02 MED ORDER — ATORVASTATIN CALCIUM 10 MG PO TABS
10.0000 mg | ORAL_TABLET | Freq: Every evening | ORAL | Status: DC
  Administered 2022-10-02 – 2022-10-03 (×2): 10 mg via ORAL
  Filled 2022-10-02 (×2): qty 1

## 2022-10-02 NOTE — Progress Notes (Signed)
Inpatient Rehabilitation Admissions Coordinator   To patient's bedside for assessment of rehab needs. No family at bedside. I have left a voicemail for son to contact me or for staff to make me aware when family present.  Ottie Glazier, RN, MSN Rehab Admissions Coordinator [phone] 10/02/2022 1:44 PM

## 2022-10-02 NOTE — Progress Notes (Signed)
Daily Progress Note Intern Pager: [phone]  Patient name: [name] [name] Medical record number: [id number] Date of birth: 1930-04-16 Age: 88 y.o. Gender: female  Primary Care Provider: [name], MD Consultants: CCM, Cardiology, GI Code Status: Full   Pt Overview and Major Events to Date:  10/23: Admitted to FPTS 10/27: Admitted to CCM 10/29: CT head demonstrated no acute abnormalities 10/29: CXR moderate bilateral pleural effusions, R>L 10/31: TEE/DCCV performed  11/4: Back to FMTS from ICU   Assessment and Plan: [name] is an 88 year old female with hx of HF who was admitted with new onset A-fib with RVR and volume overloaded.  Eventually transferred to the ICU for hypoxic respiratory failure and hospitalization complicated by GI bleed.  Pertinent PMH/PSH includes hypertension, HLD, seizures, essential tremor, diastolic heart failure and CVA.   * Atrial fibrillation with RVR (HCC) Patient on admission was found to have new onset A-fib with RVR.  Received TEE/DCCV on 10/31, s/p cardioversion. Rate controlled. - Cardiology following, appreciate continued recs - Diltiazem 30mg  QID, Lopressor 50 mg TID, amiodarone 200 mg now qd 11/6, and digoxin 0.0625 mg daily per cardiology - Continue Cardiac monitoring - heparin for DVT ppx, will monitor closely for bleeding and discontinue as appropriate    Acute on chronic diastolic heart failure (HCC) Remains euvolemic on exam, stable on 2L. Down 6.5L since admission.  - Cardiology following, appreciate recs - Strict I's and O's, daily weight - Continue Torsemide 20mg  daily -wean O2 as tolerated  Dark brown-colored urine New since yesterday. Has catheter in place with draining dark urine. Patient endorses suprapubic pain today. UA on 10/29 with positive nitrites and large Hgb. Suspicious for UTI. -F/u UA and urine culture  Bilateral hip pain CTAP pending to evaluate for abdominal pain, though will be able to see  bilateral hips. Suspect pain 2/2 deconditioning vs post-CVA. DVT pending but exam remains unremarkable for DVT; she still has tenderness with palpation of the R knee and passive flexion of knee and hip. CTAP without focal MSK findings. -F/u DVT 11/29 of RLE to r/o DVT -OOB with PT/OT for conditioning   Essential tremor Stable. Still have head tremors with is present at baseline. Home medication includes Xanax and primidone -Holding home Xanax -f/u neuro outpatient  Bilateral pleural effusion As seen on CXR and CTAP. Appears stable, though unclear cause, and patient is still in 2L [place]. Suspect potential afib with RVR that sent her to ICU could have precipitated fluid buildup; however, would have expected the effusions to have lessened by now. Spoke with [place] the granddaughter who would speak with family about their wishes for tap. In the meantime, will consult pulmonology for their recommendations. -F/u pulmonology recommendations  Swelling of right extremity Right arm swelling that started a few days ago, DVT US notable for superficial thrombosis of right basilar vein. Thrombosis should resolve on its own over time, though will try conservative management until then. -continue alternating cold and warm compress -apply voltaren gel to area for pain control -continue to monitor closely  -consider abx if patient becomes febrile  Abdominal pain Abdominal Gae Dry normal. Still experiencing abdominal pain, now with suprapubic tenderness. CTAP with findings suggestive of ileus and endometrial distention. Of note, LFTs have been persistently elevated over admission. RUQ [place] was obtained and showed normal gallbladder and common bile duct, so do not suspect this is cause for abdominal pain. LFTs could also be elevated 2/2 amiodarone. Hepatitis panel this AM negative. -continue miralax -OOB with  PT/OT to mobilize bowel contents -F/u message to cardiology about amiodarone and LFTs; if no new information,  consider re consult to GI  Anemia Likely initially secondary to GI bleed. EGD demonstrated nonbleeding ulcer. Hgb stable. Will continue PPI given GI bleed. -GI following, appreciate recs -Continue daily am CBC -Protonix 40 mg twice daily -monitor for signs of bleeding  -Transfusion threshold <7    FEN/GI: dysphagia 3 diet  PPx: heparin subq Dispo:SNF vs CIR, admissions coordinator following  Subjective:  Doing well this morning. Arm, hips, and abdominal pain all improved with PT.  Objective: Temp:  [97.6 F (36.4 C)-98.4 F (36.9 C)] 97.7 F (36.5 C) (11/08 0734) Pulse Rate:  [60-70] 70 (11/08 0734) Resp:  [14-24] 24 (11/08 0734) BP: (106-126)/(41-67) 117/47 (11/08 0734) SpO2:  [95 %-98 %] 97 % (11/08 0734) Weight:  [79.1 kg] 79.1 kg (11/08 0450) Physical Exam: General: Alert and oriented, in NAD Skin: Warm, dry HEENT: NCAT, EOM grossly normal, midline nasal septum Cardiac: RRR, no m/r/g appreciated Respiratory: CTAB anteriorly, breathing and speaking comfortably on 2L  Abdominal: Soft, mildly TTP throughout, nondistended, normoactive bowel sounds Extremities: Moves all extremities grossly equally in bed Neurological: No gross focal deficit Psychiatric: Appropriate mood and affect   Laboratory: Most recent CBC Lab Results  Component Value Date   WBC 9.9 10/01/2022   HGB 9.4 (L) 10/01/2022   HCT 28.5 (L) 10/01/2022   MCV 93.8 10/01/2022   PLT 172 10/01/2022   Most recent BMP    Latest Ref Rng & Units 10/02/2022    1:48 AM  BMP  Glucose 70 - 99 mg/dL 112   BUN 8 - 23 mg/dL 18   Creatinine 0.44 - 1.00 mg/dL 0.66   Sodium 135 - 145 mmol/L 134   Potassium 3.5 - 5.1 mmol/L 4.4   Chloride 98 - 111 mmol/L 93   CO2 22 - 32 mmol/L 33   Calcium 8.9 - 10.3 mg/dL 8.5    Ethelene Hal, MD 10/02/2022, 8:10 AM PGY-1, [place] Intern pager: [phone], text pages welcome Secure chat group [facility] Teaching Service

## 2022-10-02 NOTE — Final Progress Note (Signed)
FMTS Interim Progress Note  Attempted to call granddaughter to discuss anticoagulation. No answer and voicemail box was full. Will attempt to call again tomorrow to discuss therapy options.   Glendale Chard, DO 10/02/2022, 7:10 PM PGY-1, [facility] Family Medicine Service pager [phone]

## 2022-10-02 NOTE — Progress Notes (Signed)
UA from earlier today noted to have bacteria and leukocytes with reported suprapubic pain on examination. Called microbiology lab but they reported that they have not done a culture on the urine and it has been over 5 hours so another sample will need to be collected. Discussed with nursing who is to collect another urine culture and we will start antibiotics tonight.    Jaylei Fuerte, DO

## 2022-10-02 NOTE — Progress Notes (Signed)
  Given recent CT abdomen/pelvis finding of moderate right and small left pleural effusion with associated compressive atelectasis of the lung bases, consulted PCCM and spoke with Brett Canales Minor, NP. Appreciate recommendations on possible intervention vs conservative management.   Reece Leader, DO 10/02/2022, 8:21 AM PGY-3, [facility] Family Medicine Service pager [phone]

## 2022-10-02 NOTE — Assessment & Plan Note (Addendum)
As seen on CXR and CTAP. Appears stable, though unclear cause, and patient is still in 2L [place]. Suspect potential afib with RVR that sent her to ICU could have precipitated fluid buildup; however, would have expected the effusions to have lessened by now. Spoke with Gae Dry the granddaughter who would speak with family about their wishes for tap. In the meantime, will consult pulmonology for their recommendations. -F/u pulmonology recommendations

## 2022-10-02 NOTE — Progress Notes (Signed)
NAME:  LARENDA REEDY, MRN:  [id number], DOB:  1930-07-26, LOS: 15 ADMISSION DATE:  09/16/2022, CONSULTATION DATE:  10/27 REFERRING MD:  Cardiology, CHIEF COMPLAINT:  Afib, Acute respiratory failure   History of Present Illness:  88 year old female with known atrial fibrillation rapid ventricular response who was scheduled for cardioversion but subsequently  developed acute respiratory distress requiring noninvasive imaging treated with aggressive diuresis.   Course complicated by upper GI bleed due to gastric ulcer She had an ICU stay from 10/27 to 11/3 PCCM reconsulted 11/8 for bilateral effusions noted on CT scan   Pertinent  Medical History   Past Medical History:  Diagnosis Date   Anxiety    Diabetes (HCC) 10/27/2013   DVT (deep venous thrombosis) (HCC)    Essential tremor 10/27/2013   High cholesterol 1999   Hypertension    Ischemic stroke (HCC) 07/18/2014   Obesity, unspecified 10/27/2013   Pneumonia    Unspecified hereditary and idiopathic peripheral neuropathy 10/27/2013   Significant Hospital Events: Including procedures, antibiotic start and stop dates in addition to other pertinent events   10/27 Admitted to ICU 10/29 CT head no acute intracranial abnormalities 10/29 chest x-ray moderate bilateral pleural effusion, R>L 10/31: TEE/DCCV under conscious sedation CT abdomen/pelvis on 11/6 showed mild colonic distention, moderate right and small left effusion  Interim History / Subjective:   On 2 L nasal cannula Denies chest pain or dyspnea Daughter at bedside and provides additional history  Objective   Blood pressure 118/60, pulse 73, temperature 97.7 F (36.5 C), temperature source Oral, resp. rate (!) 28, height 5' (1.524 m), weight 79.1 kg, SpO2 99 %.        Intake/Output Summary (Last 24 hours) at 10/02/2022 1705 Last data filed at 10/02/2022 0600 Gross per 24 hour  Intake 340 ml  Output 1300 ml  Net -960 ml    Filed Weights   09/30/22 0351 10/01/22  0001 10/02/22 0450  Weight: 82.4 kg 79.9 kg 79.1 kg   Examination: General: Acutely ill elderly woman comfortable in bed.  No acute distress on nasal cannula HENT: [place]/AT. Moist mucous membrane. Lungs: Breath sounds bilateral, no rhonchi, no accessory muscle use Cardiovascular: S1-S2 regular Abdomen: Soft.  NT/ND.  Normal bowel sounds. Extremities: Warm and well perfused.  No LE edema. Neuro: Alert and awake.  Moving all extremities.    Resolved Hospital Problem list   GI bleed  Assessment & Plan:  Acute hypoxic respiratory failure Acute on chronic diastolic heart failure Pulmonary edema Bilateral pleural effusions -could be related to diastolic heart failure or low albumin  Discussed procedure thoracenteses with patient and her daughter.  Not sure that this will enable her to come off oxygen and she would like to avoid painful procedures Do not feel that thoracentesis is necessary at this time -  Atrial fibrillation  -Cardiology following,   GI bleed EGD  showed nonbleeding gastric ulcer and was biopsied.  Plan to hold anticoagulation for at least 2 weeks until follow-up with GI. -GI following, appreciate assistance -Continue PPI twice daily -Continue to hold Eliquis -Transfuse for Hgb < 7   PCC will be available as needed  Best Practice (right click and "Reselect all SmartList Selections" daily)    Code Status:  DNR Last date of multidisciplinary goals of care discussion [Updated family at bedside]  Labs   CBC: Recent Labs  Lab 09/27/22 0357 09/28/22 0114 09/29/22 0134 09/30/22 0055 10/01/22 0644  WBC 8.4 9.2 10.2 11.1* 9.9  NEUTROABS  --   --  7.7  --  7.3  HGB 8.6* 9.2* 9.1* 9.1* 9.4*  HCT 26.3* 28.5* 28.2* 28.1* 28.5*  MCV 95.3 95.3 95.9 94.6 93.8  PLT 122* 138* 138* 158 172     Basic Metabolic Panel: Recent Labs  Lab 09/26/22 0240 09/27/22 0357 09/28/22 0114 09/29/22 0134 09/30/22 0055 10/01/22 0644 10/02/22 0148  NA 139 139 138 137 136  135 134*  K 3.4* 4.2 4.1 4.1 4.1 4.4 4.4  CL 96* 95* 96* 95* 93* 89* 93*  CO2 33* 36* 35* 34* 31 32 33*  GLUCOSE 129* 123* 132* 129* 117* 121* 112*  BUN 21 18 17 18 20 16 18   CREATININE 0.77 0.74 0.60 0.71 0.71 0.67 0.66  CALCIUM 8.2* 8.4* 8.5* 8.3* 8.2* 8.5* 8.5*  MG 1.8 2.2  --   --   --   --   --     GFR: Estimated Creatinine Clearance: 44.3 mL/min (by C-G formula based on SCr of 0.66 mg/dL). Recent Labs  Lab 09/28/22 0114 09/29/22 0134 09/30/22 0055 10/01/22 0644  WBC 9.2 10.2 11.1* 9.9     Liver Function Tests: Recent Labs  Lab 09/28/22 0114 09/29/22 0134 09/30/22 0055 10/01/22 0644 10/02/22 0148  AST 100* 125* 144* 159* 149*  ALT 123* 142* 163* 191* 197*  ALKPHOS 84 93 89 96 95  BILITOT 0.6 0.6 0.7 0.6 0.4  PROT 5.9* 5.7* 5.9* 5.9* 5.8*  ALBUMIN 2.8* 2.7* 2.6* 2.6* 2.6*    No results for input(s): "LIPASE", "AMYLASE" in the last 168 hours. No results for input(s): "AMMONIA" in the last 168 hours.  ABG    Component Value Date/Time   PHART 7.43 09/20/2022 0833   PCO2ART 61 (H) 09/20/2022 0833   PO2ART 204 (H) 09/20/2022 0833   HCO3 41.2 (H) 09/20/2022 1554   TCO2 32 12/08/2010 2038   O2SAT 90.2 09/20/2022 1554     Coagulation Profile: No results for input(s): "INR", "PROTIME" in the last 168 hours.  Cardiac Enzymes: No results for input(s): "CKTOTAL", "CKMB", "CKMBINDEX", "TROPONINI" in the last 168 hours.  HbA1C: Hemoglobin A1C  Date/Time Value Ref Range Status  11/30/2020 12:09 PM 5.4 4.0 - 5.6 % Final   HbA1c, POC (controlled diabetic range)  Date/Time Value Ref Range Status  06/14/2022 12:15 PM 5.6 0.0 - 7.0 % Final  12/17/2018 03:56 PM 5.3 0.0 - 7.0 % Final    CBG: No results for input(s): "GLUCAP" in the last 168 hours.  Kara Mead MD. Shade Flood. [facility] Pager : [phone]  If no response to pager , please call [phone] until 7 pm After 7:00 pm call Elink  [phone]   10/02/2022

## 2022-10-02 NOTE — Progress Notes (Signed)
Lower extremity venous right study completed.  Preliminary results relayed to Pollie Meyer, MD and [facility] Teaching Service.  See CV Proc for preliminary results report.   Jean Rosenthal, RDMS, RVT

## 2022-10-02 NOTE — Consult Note (Signed)
Ref: [name], MD   Subjective:  Awake.  Monitor shows sinus rhythm. Abnormal LFT continues with small decline. Poor oral intake from colonic ileus/abdominal discomfort  Objective:  Vital Signs in the last 24 hours: Temp:  [97.7 F (36.5 C)-98.4 F (36.9 C)] 97.7 F (36.5 C) (11/08 0734) Pulse Rate:  [60-70] 70 (11/08 0734) Cardiac Rhythm: Normal sinus rhythm;Heart block (11/07 1900) Resp:  [14-24] 24 (11/08 0734) BP: (106-126)/(41-67) 117/47 (11/08 0734) SpO2:  [95 %-98 %] 97 % (11/08 0734) Weight:  [79.1 kg] 79.1 kg (11/08 0450)  Physical Exam: BP Readings from Last 1 Encounters:  10/02/22 (!) 117/47     Wt Readings from Last 1 Encounters:  10/02/22 79.1 kg    Weight change: -0.8 kg Body mass index is 34.06 kg/m. HEENT: [place]/AT, Eyes-Brown, Conjunctiva-Pale, Sclera-Non-icteric Neck: No JVD, No bruit, Trachea midline. Lungs:  Clear, Bilateral. Cardiac:  Regular rhythm, normal S1 and S2, no S3. II/VI systolic murmur. Abdomen:  Soft, non-tender. BS present. Extremities:  No edema present. No cyanosis. No clubbing. CNS: AxOx1, Cranial nerves grossly intact, moves all 4 extremities.  Skin: Warm and dry.   Intake/Output from previous day: 11/07 0701 - 11/08 0700 In: 580 [P.O.:580] Out: 1300 [Urine:1300]    Lab Results: BMET    Component Value Date/Time   NA 134 (L) 10/02/2022 0148   NA 135 10/01/2022 0644   NA 136 09/30/2022 0055   NA 141 06/14/2022 1505   NA 141 04/26/2020 1708   NA 141 06/22/2018 1448   K 4.4 10/02/2022 0148   K 4.4 10/01/2022 0644   K 4.1 09/30/2022 0055   CL 93 (L) 10/02/2022 0148   CL 89 (L) 10/01/2022 0644   CL 93 (L) 09/30/2022 0055   CO2 33 (H) 10/02/2022 0148   CO2 32 10/01/2022 0644   CO2 31 09/30/2022 0055   GLUCOSE 112 (H) 10/02/2022 0148   GLUCOSE 121 (H) 10/01/2022 0644   GLUCOSE 117 (H) 09/30/2022 0055   BUN 18 10/02/2022 0148   BUN 16 10/01/2022 0644   BUN 20 09/30/2022 0055   BUN 17 06/14/2022 1505   BUN 19  04/26/2020 1708   BUN 14 06/22/2018 1448   CREATININE 0.66 10/02/2022 0148   CREATININE 0.67 10/01/2022 0644   CREATININE 0.71 09/30/2022 0055   CALCIUM 8.5 (L) 10/02/2022 0148   CALCIUM 8.5 (L) 10/01/2022 0644   CALCIUM 8.2 (L) 09/30/2022 0055   GFRNONAA >60 10/02/2022 0148   GFRNONAA >60 10/01/2022 0644   GFRNONAA >60 09/30/2022 0055   GFRAA 69 04/26/2020 1708   GFRAA >60 06/15/2019 0841   GFRAA >60 11/13/2018 1941   CBC    Component Value Date/Time   WBC 9.9 10/01/2022 0644   RBC 3.04 (L) 10/01/2022 0644   HGB 9.4 (L) 10/01/2022 0644   HGB 15.2 04/26/2020 1708   HCT 28.5 (L) 10/01/2022 0644   HCT 46.5 04/26/2020 1708   PLT 172 10/01/2022 0644   PLT 102 (L) 04/26/2020 1708   MCV 93.8 10/01/2022 0644   MCV 94 04/26/2020 1708   MCH 30.9 10/01/2022 0644   MCHC 33.0 10/01/2022 0644   RDW 14.8 10/01/2022 0644   RDW 12.8 04/26/2020 1708   LYMPHSABS 1.5 10/01/2022 0644   LYMPHSABS 1.6 04/26/2020 1708   MONOABS 0.8 10/01/2022 0644   EOSABS 0.2 10/01/2022 0644   EOSABS 0.0 04/26/2020 1708   BASOSABS 0.1 10/01/2022 0644   BASOSABS 0.0 04/26/2020 1708   HEPATIC Function Panel Recent Labs  09/30/22 0055 10/01/22 0644 10/02/22 0148  PROT 5.9* 5.9* 5.8*  ALBUMIN 2.6* 2.6* 2.6*  AST 144* 159* 149*  ALT 163* 191* 197*  ALKPHOS 89 96 95   HEMOGLOBIN A1C Lab Results  Component Value Date   MPG 126 (H) 07/18/2014   CARDIAC ENZYMES Lab Results  Component Value Date   TROPONINI <0.30 03/24/2012   BNP No results for input(s): "PROBNP" in the last 8760 hours. TSH Recent Labs    09/16/22 1929  TSH 1.792   CHOLESTEROL Recent Labs    06/14/22 1505 09/17/22 0207  CHOL 180 161    Scheduled Meds:  amiodarone  200 mg Oral Daily   atorvastatin  10 mg Oral QPM   Chlorhexidine Gluconate Cloth  6 each Topical Daily   cycloSPORINE  1 drop Both Eyes BID   digoxin  0.0625 mg Oral Daily   diltiazem  30 mg Oral Q8H   DULoxetine  20 mg Oral Daily   fentaNYL  (SUBLIMAZE) injection  100 mcg Intravenous Once   gabapentin  100 mg Oral QHS   heparin injection (subcutaneous)  5,000 Units Subcutaneous Q8H   lactose free nutrition  237 mL Oral TID WC   levETIRAcetam  500 mg Oral BID   magnesium oxide  400 mg Oral Daily   metoprolol succinate  50 mg Oral QPM   multivitamin with minerals  1 tablet Oral Daily   mouth rinse  15 mL Mouth Rinse 4 times per day   pantoprazole  40 mg Oral BID   polyethylene glycol  17 g Oral Daily   potassium chloride  10 mEq Oral BID   torsemide  20 mg Oral Daily   Continuous Infusions:  sodium chloride Stopped (09/26/22 1457)   albumin human     PRN Meds:.diclofenac Sodium, guaiFENesin, mouth rinse, senna, simethicone  Assessment/Plan: Acute on chronic diastolic left heart failure Acute respiratory failure with hypoxia Paroxysmal atrial fibrillation S/P external cardioversion to sinus rhythm Abdominal pain with elevated LFT Colonic Ileus Recent GI bleed Gastric ulcer HTN HLD Old stroke Obesity Arthritis Parkinson's disease  Plan: One dose IV albumin. Consider parenteral nutrition if PO intake is poor. Decrease atorvastatin by 50 % now. Decrease amiodarone by 50 % in 1 week.   LOS: 15 days   Time spent including chart review, lab review, examination, discussion with patient/Family/Resident : 30 min   Dixie Dials  MD  10/02/2022, 8:56 AM

## 2022-10-02 NOTE — Progress Notes (Signed)
  Inpatient Rehabilitation Admissions Coordinator   Met with patient and son at bedside for rehab assessment. We discussed goals and expectations of a possible CIR admit. They were at [facility] 2015 after her CVA and prefer cir rather than SNF.I have asked therapy to change their recommendations to support that preference if they feel appropriate as a therapist discussed this option with them yesterday per son. I will then discuss with my Rehab MD her case to clarify if she is felt to be a candidate. Please call me with any questions.   Danne Baxter, RN, MSN Rehab Admissions Coordinator [phone]

## 2022-10-03 ENCOUNTER — Inpatient Hospital Stay (HOSPITAL_COMMUNITY): Payer: Medicare Other

## 2022-10-03 DIAGNOSIS — I4891 Unspecified atrial fibrillation: Secondary | ICD-10-CM | POA: Diagnosis not present

## 2022-10-03 LAB — COMPREHENSIVE METABOLIC PANEL
ALT: 160 U/L — ABNORMAL HIGH (ref 0–44)
AST: 101 U/L — ABNORMAL HIGH (ref 15–41)
Albumin: 2.7 g/dL — ABNORMAL LOW (ref 3.5–5.0)
Alkaline Phosphatase: 88 U/L (ref 38–126)
Anion gap: 9 (ref 5–15)
BUN: 17 mg/dL (ref 8–23)
CO2: 35 mmol/L — ABNORMAL HIGH (ref 22–32)
Calcium: 8.6 mg/dL — ABNORMAL LOW (ref 8.9–10.3)
Chloride: 91 mmol/L — ABNORMAL LOW (ref 98–111)
Creatinine, Ser: 0.75 mg/dL (ref 0.44–1.00)
GFR, Estimated: >60 mL/min (ref >60)
Glucose, Bld: 132 mg/dL — ABNORMAL HIGH (ref 70–99)
Potassium: 4.3 mmol/L (ref 3.5–5.1)
Sodium: 135 mmol/L (ref 135–145)
Total Bilirubin: 0.4 mg/dL (ref 0.3–1.2)
Total Protein: 5.8 g/dL — ABNORMAL LOW (ref 6.5–8.1)

## 2022-10-03 MED ORDER — ACETAMINOPHEN 325 MG PO TABS: 650.0000 mg | ORAL_TABLET | Freq: Four times a day (QID) | ORAL | Status: DC | PRN

## 2022-10-03 MED ORDER — HEPARIN (PORCINE) 25000 UT/250ML-% IV SOLN
1050.0000 [IU]/h | INTRAVENOUS | Status: AC
  Administered 2022-10-03: 1050 [IU]/h via INTRAVENOUS
  Filled 2022-10-03: qty 250

## 2022-10-03 MED ORDER — ACETAMINOPHEN 500 MG PO TABS
500.0000 mg | ORAL_TABLET | Freq: Four times a day (QID) | ORAL | Status: DC
  Administered 2022-10-04: 500 mg via ORAL
  Filled 2022-10-03: qty 1

## 2022-10-03 MED ORDER — IOHEXOL 350 MG/ML SOLN
75.0000 mL | Freq: Once | INTRAVENOUS | Status: AC | PRN
  Administered 2022-10-03: 75 mL via INTRAVENOUS

## 2022-10-03 MED ORDER — ACETAMINOPHEN 500 MG PO TABS: 500.0000 mg | ORAL_TABLET | Freq: Four times a day (QID) | ORAL | Status: DC | PRN

## 2022-10-03 NOTE — Progress Notes (Signed)
PT Cancellation Note  Patient Details Name: [name] [name] MRN: [id number] DOB: November 13, 1930   Cancelled Treatment:    Reason Eval/Treat Not Completed: Patient at procedure or test/unavailable, awaiting IV placement then going for CT. Will try to follow-up after this as schedule permits.  Ina Homes, PT, DPT Acute Rehabilitation Services  Personal: Secure Chat Rehab Office: [phone]  Malachy Chamber 10/03/2022, 2:41 PM

## 2022-10-03 NOTE — Progress Notes (Addendum)
Attempted to call patient's granddaughters x3 tonight to discuss options for PE treatment but was not able to reach anyone. Will attempt further calls as able.    8:59 PM Was able to reach patient's granddaughter Teodora Medici to discuss the results of the CTA with acute scattered pulmonary emboli superimposed on chronic ones (with the chronic ones causing most of the filling defects). We discussed the risks and benefits of anticoagulation with heparin given patient's recent GI bleed along with her increasing clot burden. Her hemoglobin has been stable for the last week since her EGD and she has been tolerating the subcutaneous DVT dosing of heparin so far. She wants to reach out to her other family members and discuss further with them. I gave her a call back number to reach out once they have decided.    9:33 PM Received call from Shiva who states that the family is in agreement to start the heparin and keep a close eye on the patient's stability and hemoglobin level. Heparin per pharmacy consult was placed.   Daylynn Stumpp, DO

## 2022-10-03 NOTE — Progress Notes (Signed)
Daily Progress Note Intern Pager: [phone]  Patient name: [name] [name] Medical record number: [id number] Date of birth: May 18, 1930 Age: 88 y.o. Gender: female  Primary Care Provider: [name], MD Consultants: CCM, Cardiology, GI Code Status: Full   Pt Overview and Major Events to Date:  10/23: Admitted to FPTS 10/27: Admitted to CCM 10/29: CT head demonstrated no acute abnormalities 10/29: CXR moderate bilateral pleural effusions, R>L 10/31: TEE/DCCV performed  11/4: Back to FMTS from ICU   Assessment and Plan: [name] [name] is an 88 year old female with hx of HF who was admitted with new onset A-fib with RVR and volume overloaded. Eventually transferred to the ICU for hypoxic respiratory failure and hospitalization complicated by GI bleed. Pertinent PMH/PSH includes hypertension, HLD, seizures, essential tremor, diastolic heart failure and CVA.   * Atrial fibrillation with RVR (HCC) Patient on admission was found to have new onset A-fib with RVR.  Received TEE/DCCV on 10/31, s/p cardioversion. Rate controlled. - Cardiology following, appreciate continued recs - Diltiazem 30mg  QID, Lopressor 50 mg TID, amiodarone 200 mg now qd 11/6, and digoxin 0.0625 mg daily per cardiology - Continue Cardiac monitoring - heparin for DVT ppx, will monitor closely for bleeding and discontinue as appropriate    Bilateral pleural effusion As seen on CXR and CTAP. Appears stable, though unclear cause, and patient is still in 2L [place]. Suspect potential afib with RVR that sent her to ICU could have precipitated fluid buildup. Pulm with recs, after speaking with family, to avoid thoracentesis given painful procedures and not guaranteed she would come off of O2. -Monitor clinical status -O2 as needed  UTI (urinary tract infection) Patient with suprapubic pain and dark urine from catheter. UA on 10/29 with positive nitrites and large Hgb. UA yesterday 11/8 suspicious for UTI. Ucx pending.  Started cefdinir 300 mg BID for UTI. -Continue cefdinir (day 1/5)  Abdominal pain Abdominal 13/8 normal. Still experiencing abdominal pain, now with suprapubic tenderness. CTAP with findings suggestive of ileus and endometrial distention. Of note, LFTs have been persistently elevated over admission. RUQ [place] was obtained and showed normal gallbladder and common bile duct, so do not suspect this is cause for abdominal pain. LFTs could also be elevated 2/2 amiodarone. Hepatitis panel negative. -continue miralax -OOB with PT/OT to mobilize bowel contents -Cardiology to reduce statin by 50% and will reduce amio dose by 50% in 1 week, will assess LFTs  Bilateral hip pain with RLE DVT CTAP negative for [facility] pathology. Suspect pain in LLE 2/2 deconditioning vs post-CVA. However, new and acute RLE pain found to be DVT as seen on [place]. -OOB with PT/OT for conditioning -Will discuss with family today about starting anticoagulation in light of GI bleed earlier in admission   Acute on chronic diastolic heart failure (HCC) Remains euvolemic on exam, stable on 2L. Down 6.5L since admission.  - Cardiology following, appreciate recs - Strict I's and O's, daily weight - Continue Torsemide 20mg  daily -wean O2 as tolerated  Swelling of right upper extremity Right arm swelling that started a few days ago, DVT US notable for superficial thrombosis of right basilar vein. Thrombosis should resolve on its own over time, though will try conservative management until then. -continue alternating cold and warm compress -apply voltaren gel to area for pain control -continue to monitor closely  -consider abx if patient becomes febrile  Anemia Likely initially secondary to GI bleed. EGD demonstrated nonbleeding ulcer. Hgb stable. Will continue PPI given GI bleed. -GI following,  appreciate recs -Continue daily am CBC -Protonix 40 mg twice daily -monitor for signs of bleeding  -Transfusion threshold <7    Essential  tremor Stable. Still have head tremors with is present at baseline. Home medication includes Xanax and primidone -Holding home Xanax -f/u neuro outpatient   FEN/GI: dysphagia 3 diet  PPx: heparin subq Dispo:Likely CIR  Subjective:  Still feels improved this morning. Her and family prefers to go to CIR and think that this will help her feel even better.  Objective: Temp:  [97.7 F (36.5 C)-98.4 F (36.9 C)] 98.4 F (36.9 C) (11/09 0400) Pulse Rate:  [64-73] 64 (11/09 0400) Resp:  [13-28] 23 (11/09 0400) BP: (85-127)/(41-74) 119/41 (11/09 0400) SpO2:  [95 %-100 %] 100 % (11/09 0400) Weight:  [77.1 kg] 77.1 kg (11/09 0527) Physical Exam: General: Alert and oriented, in NAD Skin: Warm, dry HEENT: NCAT, EOM grossly normal, midline nasal septum Cardiac: RRR, no m/r/g appreciated Respiratory: CTAB anteriorly, breathing and speaking comfortably on 2L [place] Abdominal: Soft, mildly TTP throughout, nondistended, normoactive bowel sounds Extremities: Moves all extremities grossly equally in bed Neurological: No gross focal deficit Psychiatric: Appropriate mood and affect   Laboratory: Most recent CBC Lab Results  Component Value Date   WBC 9.9 10/01/2022   HGB 9.4 (L) 10/01/2022   HCT 28.5 (L) 10/01/2022   MCV 93.8 10/01/2022   PLT 172 10/01/2022   Most recent BMP    Latest Ref Rng & Units 10/03/2022    1:35 AM  BMP  Glucose 70 - 99 mg/dL 132   BUN 8 - 23 mg/dL 17   Creatinine 0.44 - 1.00 mg/dL 0.75   Sodium 135 - 145 mmol/L 135   Potassium 3.5 - 5.1 mmol/L 4.3   Chloride 98 - 111 mmol/L 91   CO2 22 - 32 mmol/L 35   Calcium 8.9 - 10.3 mg/dL 8.6    Ethelene Hal, MD 10/03/2022, 7:25 AM PGY-1, [place] Intern pager: [phone], text pages welcome Secure chat group [facility] Teaching Service

## 2022-10-03 NOTE — Progress Notes (Signed)
Physical Therapy Treatment Patient Details Name: [name] [name] MRN: [id number] DOB: Sep 29, 1930 Today's Date: 10/03/2022   History of Present Illness Pt is an 88 y.o. female admitted 09/16/22 with worsening BLE weakness, SOB. Pt with new onset AFib with RVR as well as new CHF and GIB. Bilateral pleural effusions R>L found 10/29. S/P TEE/DCCV 10/31. Tx to ICU due to GIB. PMH includes HTN, DVT, DM, essential tremor, stroke (2015), anxiety.    PT Comments    Pt pleasant and steady progressing with mobility. Today's session focused on transfers and pre-gait activities. Sit to stand modA+2 with RW. Lateral stepping to L modA+2 with verbal and tactile cues necessary to progress RW and allow pt to stay in upright position. VSS throughout mobility. Pt remains limited by generalized weakness, decreased activity tolerance, and impaired balance strategies/postural reactions. Today's session limited today due to IV team arrival for necessary IV placement before CT scan. Continue to recommend acute PT services to maximize functional mobility and independence prior to d/c to AIR level therapies. Family of great support and daughter asking appropriate questions during session.    Recommendations for follow up therapy are one component of a multi-disciplinary discharge planning process, led by the attending physician.  Recommendations may be updated based on patient status, additional functional criteria and insurance authorization.  Follow Up Recommendations  Acute inpatient rehab (3hours/day) Can patient physically be transported by private vehicle: No   Assistance Recommended at Discharge Frequent or constant Supervision/Assistance  Patient can return home with the following Two people to help with walking and/or transfers;Two people to help with bathing/dressing/bathroom;Direct supervision/assist for medications management;Assistance with feeding;Assist for transportation;Assistance with  cooking/housework;Direct supervision/assist for financial management   Equipment Recommendations  Hospital bed;Wheelchair (measurements PT);BSC/3in1    Recommendations for Other Services       Precautions / Restrictions Precautions Precautions: Fall;Other (comment) Precaution Comments: Watch O2 Restrictions Weight Bearing Restrictions: No     Mobility  Bed Mobility Overal bed mobility: Needs Assistance Bed Mobility: Supine to Sit     Supine to sit: Mod assist, +2 for physical assistance, Min assist Sit to supine: +2 for physical assistance, Mod assist   General bed mobility comments: min-mod+2 supine <> sit with trunk assist and utilizing pad to get hips closer to EOB; pt with great insight on how to advance mobility by beginning to initiate but ultimately requires assist for successful movement    Transfers Overall transfer level: Needs assistance Equipment used: Rolling walker (2 wheels) Transfers: Sit to/from Stand Sit to Stand: Mod assist, +2 physical assistance           General transfer comment: modA+2 to stand, pt driven to stand up and did not allow for therapist to correct UE placement, will continue to address appropriate sequence for successful transfers    Ambulation/Gait             Pre-gait activities: lateral stepping to L along side of bed with verbal and tactile cues for RW negotiation and sequencing of LEs, modA+2 throughout     Stairs             Wheelchair Mobility    Modified Rankin (Stroke Patients Only)       Balance Overall balance assessment: Needs assistance Sitting-balance support: Feet supported, No upper extremity supported Sitting balance-Leahy Scale: Fair Sitting balance - Comments: fluctuating from min guard-modA sitting EOB Postural control: Posterior lean Standing balance support: Bilateral upper extremity supported, During functional activity Standing balance-Leahy Scale: Poor Standing balance comment:  reliant on UEs for upright positioning, did not attempt single hand support                            Cognition Arousal/Alertness: Awake/alert Behavior During Therapy: WFL for tasks assessed/performed Overall Cognitive Status: Difficult to assess                                 General Comments: family assist for translation but pt knows some conversational english, follows commands with increased time        Exercises      General Comments General comments (skin integrity, edema, etc.): VSS on 2L [place]      Pertinent Vitals/Pain Pain Assessment Faces Pain Scale: Hurts a little bit Pain Location: generalized Pain Descriptors / Indicators: Aching, Tiring Pain Intervention(s): Monitored during session    Home Living                          Prior Function            PT Goals (current goals can now be found in the care plan section) Acute Rehab PT Goals Patient Stated Goal: continue to work with therapy PT Goal Formulation: With patient Time For Goal Achievement: 10/07/22 Potential to Achieve Goals: Fair Progress towards PT goals: Progressing toward goals    Frequency    Min 3X/week      PT Plan Current plan remains appropriate    Co-evaluation              AM-PAC PT "6 Clicks" Mobility   Outcome Measure  Help needed turning from your back to your side while in a flat bed without using bedrails?: A Lot Help needed moving from lying on your back to sitting on the side of a flat bed without using bedrails?: A Lot Help needed moving to and from a bed to a chair (including a wheelchair)?: Total Help needed standing up from a chair using your arms (e.g., wheelchair or bedside chair)?: Total Help needed to walk in hospital room?: Total Help needed climbing 3-5 steps with a railing? : Total 6 Click Score: 8    End of Session Equipment Utilized During Treatment: Gait belt;Oxygen Activity Tolerance: Patient tolerated  treatment well;Other (comment) (Treatment limited by IV team appearance) Patient left: with call bell/phone within reach;with family/visitor present;in bed Nurse Communication: Mobility status PT Visit Diagnosis: Unsteadiness on feet (R26.81);Muscle weakness (generalized) (M62.81);Difficulty in walking, not elsewhere classified (R26.2)     Time: 0272-5366 PT Time Calculation (min) (ACUTE ONLY): 16 min  Charges:  $Therapeutic Activity: 8-22 mins                    Alric Ran, SPT    [place] [facility] Montone 10/03/2022, 5:40 PM

## 2022-10-03 NOTE — Progress Notes (Signed)
ANTICOAGULATION CONSULT NOTE - Initial Consult  Pharmacy Consult for heparin Indication: pulmonary embolus and DVT  Allergies  Allergen Reactions   Tape Rash    Please do not use Plastic Tape    Patient Measurements: Height: 5' (152.4 cm) Weight: 77.1 kg (169 lb 15.6 oz) IBW/kg (Calculated) : 45.5 Heparin Dosing Weight: 63 kg  Vital Signs: Temp: 98.2 F (36.8 C) (11/09 2053) Temp Source: Oral (11/09 2053) BP: 135/50 (11/09 2053) Pulse Rate: 77 (11/09 2053)  Labs: Recent Labs    10/01/22 0644 10/02/22 0148 10/03/22 0135  HGB 9.4*  --   --   HCT 28.5*  --   --   PLT 172  --   --   CREATININE 0.67 0.66 0.75    Estimated Creatinine Clearance: 43.7 mL/min (by C-G formula based on SCr of 0.75 mg/dL).   Medical History: Past Medical History:  Diagnosis Date   Anxiety    Diabetes ([place]) 10/27/2013   DVT (deep venous thrombosis) ([facility])    Essential tremor 10/27/2013   High cholesterol 1999   Hypertension    Ischemic stroke ([place]) 07/18/2014   Obesity, unspecified 10/27/2013   Pneumonia    Unspecified hereditary and idiopathic peripheral neuropathy 10/27/2013    Medications:  Medications Prior to Admission  Medication Sig Dispense Refill Last Dose   acetaminophen (TYLENOL) 325 MG tablet Take 2 tablets (650 mg total) by mouth every 6 (six) hours as needed. (Patient taking differently: Take 325 mg by mouth every 6 (six) hours as needed for moderate pain.) 30 tablet 0 09/15/2022   ALPRAZolam (XANAX) 0.25 MG tablet Take 0.25 mg by mouth 2 (two) times daily as needed for anxiety.    09/16/2022   amLODipine (NORVASC) 2.5 MG tablet Take 2.5 mg by mouth daily.  0 09/16/2022   atorvastatin (LIPITOR) 20 MG tablet TAKE 1 TABLET BY MOUTH EVERY DAY IN THE EVENING (Patient taking differently: Take 20 mg by mouth every evening.) 90 tablet 3 09/15/2022   CALCIUM PO Take 1 tablet by mouth daily.   09/16/2022   Cholecalciferol (VITAMIN D PO) Take 1 tablet by mouth daily.   Unknown    clopidogrel (PLAVIX) 75 MG tablet TAKE 1 TABLET BY MOUTH EVERY DAY (Patient taking differently: Take 75 mg by mouth once.) 90 tablet 1 09/16/2022   diclofenac Sodium (VOLTAREN) 1 % GEL Apply 2 g topically 4 (four) times daily as needed. 350 g 2 Unknown   DULoxetine (CYMBALTA) 20 MG capsule TAKE 1 CAPSULE BY MOUTH EVERY DAY (Patient taking differently: Take 20 mg by mouth daily.) 90 capsule 2 09/15/2022   furosemide (LASIX) 20 MG tablet Take 20 mg by mouth daily.  3 09/16/2022   gabapentin (NEURONTIN) 100 MG capsule Take 1 capsule (100 mg total) by mouth at bedtime. (Patient taking differently: Take 100 mg by mouth in the morning and at bedtime.) 90 capsule 3 09/16/2022   isosorbide mononitrate (IMDUR) 30 MG 24 hr tablet Take 15 mg by mouth daily.   09/16/2022   levETIRAcetam (KEPPRA) 500 MG tablet Take 1 tablet (500 mg total) by mouth 2 (two) times daily. 180 tablet 3 09/16/2022   lidocaine (LIDODERM) 5 % Place 1 patch onto the skin daily. Remove & Discard patch within 12 hours or as directed by MD 30 patch 11 Past Week   PAZEO 0.7 % SOLN Place 1 drop into both eyes at bedtime.    Unknown   primidone (MYSOLINE) 250 MG tablet Take 1 tablet (250 mg total) by mouth  2 (two) times daily. 180 tablet 3 09/16/2022   RESTASIS 0.05 % ophthalmic emulsion Place 1 drop into both eyes 2 (two) times daily.   4 09/16/2022   ACCU-CHEK AVIVA PLUS test strip USE AS INSTRUCTED TO TEST ONCE A DAY DX CODE:E11.9 50 strip 25    Accu-Chek Softclix Lancets lancets Use as instructed 100 each 12     Assessment: 69 yof admitted with new onset Afib and volume overload. CTA chest found acute on chronic PE. LE doppler also shows right DVT. Hospital course complicated by previous GIB. Family in agreement to begin IV heparin. Pharmacy consulted to manage inpatient. No bleeding noted, Hgb stable 9s, platelets are normal. Discussed heparin bolus with Dr Clayborne Artist and ok to hold given recent GIB.   Goal of Therapy:  Heparin level  0.3-0.5 units/ml d/t recent GIB Monitor platelets by anticoagulation protocol: Yes   Plan:  Begin heparin drip at 1050 units/hr with no bolus 8h heparin level Daily heparin level, CBC Monitor for s/sx of bleeding  Thank you for involving pharmacy in this patient's care.  Loura Back, PharmD, BCPS Clinical Pharmacist Clinical phone for 10/03/2022 until 10p is [phone] 10/03/2022 9:37 PM

## 2022-10-03 NOTE — PMR Pre-admission (Signed)
PMR Admission Coordinator Pre-Admission Assessment  Patient: [name] [name] is an 88 y.o., female MRN: [id number] DOB: 1930/06/17 Height: 5' (152.4 cm) Weight: 77.1 kg  Insurance Information HMO:     PPO:      PCP:      IPA:      80/20:      OTHER:  PRIMARY: Medicare a and b [id number]      Policy#: pt      Benefits:  Phone #: passport one course online     Name: 11/9 Eff. Date: a 11/25/2000 and b 07/26/2000     Deduct: $1600      Out of Pocket Max: none      Life Max: none CIR: 100%      SNF: 20 full days Outpatient: 80%     Co-Pay: 20% Home Health: 100%      Co-Pay: none DME: 80%     Co-Pay: 20% Providers: in network  SECONDARY: Medicaid of [place]      Policy#: [id number]  Financial Counselor:       Phone#:   The "Data Collection Information Summary" for patients in Inpatient Rehabilitation Facilities with attached "Privacy Act [place] Records" was provided and verbally reviewed with: Patient  Emergency Contact Information Contact Information     Name Relation Home Work Mobile   [place] Son [phone]    Basilia Jumbo   [place] Granddaughter   [phone]       Current Medical History  Patient Admitting Diagnosis: Debility  History of Present Illness:  88 year old limited English speaking right-handed female with history of 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  Presented 09/16/2022 with generalized weakness x2 days as well as increased shortness of breath.    Chest x-ray showed increased mild to moderate  right and unchanged tiny left pleural effusions.  Chemistries unremarkable except glucose 114, GFR 56, troponin negative, BNP 465.  EKG showed atrial fibrillation with RVR and placed on intravenous Cardizem.  Echocardiogram with ejection fraction of 55 to 60% no wall motion abnormalities.   Patient initially scheduled for cardioversion by cardiology services but subsequently developed acute respiratory distress requiring noninvasive imaging treated with aggressive diuresis.  Course complicated by upper GI bleed due to gastric ulcer with follow-up gastroenterology services undergoing upper GI endoscopy 09/26/2022 per Dr. Lyndel Safe and placed on PPI twice daily x12 weeks.  Noted bouts of confusion initial restlessness CT of the head completed showing no acute findings 09/22/2022.  Patient stabilized underwent TEE/DCCV 09/24/2022 per cardiology services and currently maintained on amiodarone and Toprol as well as Cardizem/Lanoxin.  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  Patient was cleared to begin intravenous heparin and transitioned to Eliquis.  No current plan for Eliquis at this time due to GI bleed.  IV diuresis transitioned to Demadex 20 mg p.o. daily.  Patient was cleared to begin subcutaneous heparin for DVT prophylaxis 09/29/2022.  Intermittent bouts of  abdominal pain with CT of the abdomen 09/30/2022 showing mild gaseous distention of small bowel in the lower abdomen up to 3.5 cm suggesting possible ileus.  Her diet has been advanced to a mechanical soft.  She has had some elevated LFTs felt to be possibly related to amiodarone.  Hepatitis panel negative.  Urinalysis study 10/02/2022 with negative nitrite moderate  leukocytosis of any bacteria placed on Omnicef empirically with urine culture pending.   Patient's medical record from [facility] has been reviewed by the rehabilitation admission coordinator and physician.  Past Medical History  Past Medical History:  Diagnosis Date   Anxiety    Diabetes ([place]) 10/27/2013   DVT (deep venous thrombosis) (HCC)    Essential tremor 10/27/2013   High cholesterol 1999   Hypertension    Ischemic stroke ([place]) 07/18/2014   Obesity, unspecified 10/27/2013   Pneumonia    Unspecified hereditary and idiopathic peripheral neuropathy 10/27/2013   Has the patient had major surgery during 100 days prior to admission? No  Family History   family history includes Coronary artery disease in an other family member; Heart Problems in her brother and sister; Stroke in her mother.  Current Medications  Current Facility-Administered Medications:    0.9 %  sodium chloride infusion, 250 mL, Intravenous, Continuous, [place], Charisse March, MD, Stopped at 09/26/22 1457   acetaminophen (TYLENOL) tablet 500 mg, 500 mg, Oral, Q6H, Mabe, Gerald, MD   [COMPLETED] amiodarone (PACERONE) tablet 200 mg, 200 mg, Oral, BID, 200 mg at 09/29/22 2217 **FOLLOWED BY** amiodarone (PACERONE) tablet 200 mg, 200 mg, Oral, Daily, Dixie Dials, MD, 200 mg at 10/04/22 1025   amoxicillin (AMOXIL) capsule 500 mg, 500 mg, Oral, Q12H, Hoang, Daniela B, MD   apixaban (ELIQUIS) tablet 5 mg, 5 mg, Oral, BID, Wellborn, Adrienne B, RPH   atorvastatin (LIPITOR) tablet 10 mg, 10 mg, Oral, QPM, Doylene Canard, Ajay, MD, 10 mg at 10/03/22 1825   Chlorhexidine Gluconate Cloth 2 % PADS 6 each, 6 each, Topical, Daily, Chand, Currie Paris, MD, 6 each at 10/04/22 1037   cycloSPORINE (RESTASIS) 0.05 % ophthalmic emulsion 1 drop, 1 drop, Both Eyes, BID, Minor, Grace Bushy, NP, 1 drop at 10/04/22 1036   diclofenac Sodium (VOLTAREN) 1 % topical gel 2 g, 2 g, Topical, QID PRN, Darci Current, DO   digoxin (LANOXIN) tablet 0.0625 mg,  0.0625 mg, Oral, Daily, Anders Simmonds, MD, 0.0625 mg at 10/04/22 1025   diltiazem (CARDIZEM) tablet 30 mg, 30 mg, Oral, Q8H, Kadakia, Ajay, MD, 30 mg at 10/04/22 0650   DULoxetine (CYMBALTA) DR capsule 20 mg, 20 mg, Oral, Daily, Freda Jackson B, MD, 20 mg at 10/03/22 1334   gabapentin (NEURONTIN) capsule 100 mg, 100 mg, Oral, QHS, Minor, Grace Bushy, NP, 100 mg at 10/03/22 2152   guaiFENesin (ROBITUSSIN) 100 MG/5ML liquid 5 mL, 5 mL, Oral, Q4H PRN, Coy Saunas, Charisse March, MD   lactose free nutrition (BOOST PLUS) liquid 237 mL, 237 mL, Oral, TID WC, Lacinda Axon, MD, 237 mL at 10/04/22 1035   levETIRAcetam (KEPPRA) tablet 500 mg, 500 mg, Oral, BID, Freda Jackson B, MD, 500 mg at 10/04/22 0420   magnesium oxide (MAG-OX) tablet 400 mg, 400 mg, Oral, Daily, Dixie Dials, MD, 400 mg at 10/04/22 1025   metoprolol succinate (TOPROL-XL) 24 hr tablet 50 mg, 50 mg, Oral, QPM, Dixie Dials, MD, 50 mg at 10/03/22 1826   multivitamin with minerals tablet 1 tablet, 1 tablet, Oral, Daily, Dixie Dials, MD, 1 tablet at 10/04/22 1025  Oral care mouth rinse, 15 mL, Mouth Rinse, 4 times per day, Maryjane Hurter, MD, 15 mL at 10/04/22 1035   Oral care mouth rinse, 15 mL, Mouth Rinse, PRN, Verlee Monte Hortencia Conradi, MD   pantoprazole (PROTONIX) EC tablet 40 mg, 40 mg, Oral, BID, Spero Geralds, MD, 40 mg at 10/04/22 1028   polyethylene glycol (MIRALAX / GLYCOLAX) packet 17 g, 17 g, Oral, Daily, Leeanne Rio, MD, 17 g at 09/29/22 0956   potassium chloride (KLOR-CON M) CR tablet 10 mEq, 10 mEq, Oral, BID, Dixie Dials, MD, 10 mEq at 10/04/22 1028   senna (SENOKOT) tablet 8.6 mg, 1 tablet, Oral, Daily PRN, Freda Jackson B, MD, 8.6 mg at 09/25/22 0820   simethicone (MYLICON) 40 YF/7.4BS suspension 40 mg, 40 mg, Oral, Q6H PRN, Lacinda Axon, MD, 40 mg at 10/02/22 1025   torsemide (DEMADEX) tablet 20 mg, 20 mg, Oral, Daily, Dixie Dials, MD, 20 mg at 10/04/22 1025  Patients Current Diet:   Diet Order             DIET DYS 3 Room service appropriate? Yes with Assist; Fluid consistency: Thin  Diet effective now                   Precautions / Restrictions Precautions Precautions: Fall, Other (comment) Precaution Comments: Watch O2 Restrictions Weight Bearing Restrictions: No   Has the patient had 2 or more falls or a fall with injury in the past year? No  Prior Activity Level Limited Community (1-2x/wk): Mod I with RW. Was Mod I until 3- 4 weeks pta  Prior Functional Level Self Care: Did the patient need help bathing, dressing, using the toilet or eating? Needed some help  Indoor Mobility: Did the patient need assistance with walking from room to room (with or without device)? Independent  Stairs: Did the patient need assistance with internal or external stairs (with or without device)? Independent  Functional Cognition: Did the patient need help planning regular tasks such as shopping or remembering to take medications? Independent  Patient Information Are you of Hispanic, Latino/a,or Spanish origin?: A. No, not of Hispanic, Latino/a, or Spanish origin What is your race?: Z. None of the above ([name]) Do you need or want an interpreter to communicate with a doctor or health care staff?: 0. No (requests family to interpret)  Patient's Response To:  Health Literacy and Transportation Is the patient able to respond to health literacy and transportation needs?: Yes Health Literacy - How often do you need to have someone help you when you read instructions, pamphlets, or other written material from your doctor or pharmacy?: Sometimes In the past 12 months, has lack of transportation kept you from medical appointments or from getting medications?: No In the past 12 months, has lack of transportation kept you from meetings, work, or from getting things needed for daily living?: No  Home Assistive Devices / [place] Devices/Equipment: Environmental consultant  (specify type) Home Equipment: Rolling Walker (2 wheels), Other (comment) (O2)  Prior Device Use: Indicate devices/aids used by the patient prior to current illness, exacerbation or injury? Walker  Current Functional Level Cognition  Overall Cognitive Status: Within Functional Limits for tasks assessed Difficult to assess due to: Non-English speaking Orientation Level: Oriented X4 Following Commands: Follows one step commands with increased time General Comments: per son she is at her baseline    Extremity Assessment (includes Sensation/Coordination)  Upper Extremity Assessment: Generalized weakness  Lower Extremity Assessment: Defer to PT evaluation  ADLs  Overall ADL's : Needs assistance/impaired Eating/Feeding: Minimal assistance, Sitting Eating/Feeding Details (indicate cue type and reason): tremor interferring with ability to self feed - may benefit fomr tubing and lidded cup Grooming: Wash/dry face, Set up, Sitting Grooming Details (indicate cue type and reason): pt able to wash face at EOB, but would required increased assist for bimanual tasks. assist required for balance if unsupported. Upper Body Bathing: Moderate assistance Lower Body Bathing: Maximal assistance Upper Body Dressing : Moderate assistance, Sitting Lower Body Dressing: Total assistance, +2 for physical assistance, +2 for safety/equipment, Sit to/from stand Toilet Transfer: +2 for physical assistance, +2 for safety/equipment, Rolling walker (2 wheels), Moderate assistance (simulated) Toilet Transfer Details (indicate cue type and reason): using stedy to recliner Toileting- Clothing Manipulation and Hygiene: +2 for safety/equipment, Sit to/from stand, Maximal assistance Functional mobility during ADLs: Moderate assistance, +2 for physical assistance, Rolling walker (2 wheels) General ADL Comments: limited by poor endurance; easily fatigues    Mobility  Overal bed mobility: Needs Assistance Bed Mobility:  Supine to Sit Rolling: Min assist Sidelying to sit: Mod assist Supine to sit: Mod assist, +2 for physical assistance, Min assist Sit to supine: +2 for physical assistance, Mod assist General bed mobility comments: min-mod+2 supine <> sit with trunk assist and utilizing pad to get hips closer to EOB; pt with great insight on how to advance mobility by beginning to initiate but ultimately requires assist for successful movement    Transfers  Overall transfer level: Needs assistance Equipment used: Rolling walker (2 wheels) Transfers: Sit to/from Stand Sit to Stand: +2 physical assistance, Min assist Bed to/from chair/wheelchair/BSC transfer type:: Via Lift equipment Squat pivot transfers: Mod assist Step pivot transfers: Mod assist, +2 physical assistance, +2 safety/equipment Transfer via Lift Equipment: Stedy General transfer comment: modA+2 to stand, pt driven to stand up and did not allow for therapist to correct UE placement, will continue to address appropriate sequence for successful transfers    Ambulation / Gait / Stairs / Wheelchair Mobility  Ambulation/Gait General Gait Details: Deferred Pre-gait activities: lateral stepping to L along side of bed with verbal and tactile cues for RW negotiation and sequencing of LEs, modA+2 throughout    Posture / Balance Dynamic Sitting Balance Sitting balance - Comments: fluctuating from min guard-modA sitting EOB Balance Overall balance assessment: Needs assistance Sitting-balance support: Feet supported, No upper extremity supported Sitting balance-Leahy Scale: Fair Sitting balance - Comments: fluctuating from min guard-modA sitting EOB Postural control: Posterior lean Standing balance support: Bilateral upper extremity supported, During functional activity Standing balance-Leahy Scale: Poor Standing balance comment: reliant on UEs for upright positioning, did not attempt single hand support    Special needs/care consideration Family  interprets for her/Persian IV heparin   Previous Home Environment  Living Arrangements:  (lives with son)  Lives With: Son Available Help at Discharge: Available 24 hours/day (son will arrange 24/7 supervision when he is at work twice weekly) Type of Home: House Home Layout: One level Home Access: Level entry Bathroom Shower/Tub: Multimedia programmer: Standard Bathroom Accessibility: Yes Home Care Services: No (OP therapy) Additional Comments: Pt's son present and interpreting. Pt on supplamental O2 at home intermittently (only with SOB)  Discharge Living Setting Plans for Discharge Living Setting: Patient's home, Lives with (comment) (son) Type of Home at Discharge: House Discharge Home Layout: One level Discharge Home Access: Level entry Discharge Bathroom Shower/Tub: Walk-in shower Discharge Bathroom Toilet: Standard Discharge Bathroom Accessibility: Yes How Accessible: Accessible via walker Does the patient have  any problems obtaining your medications?: No  Social/Family/Support Systems Patient Roles: Parent Contact Information: son, [name] Anticipated Caregiver: son and family Anticipated Caregiver's Contact Information: see contacts Ability/Limitations of Caregiver: son works 6 hrs twice [employer] Caregiver Availability: 24/7 Discharge Plan Discussed with Primary Caregiver: Yes Is Caregiver In Agreement with Plan?: Yes Does Caregiver/Family have Issues with Lodging/Transportation while Pt is in Rehab?: No  Goals Patient/Family Goal for Rehab: supervision to min asisst with PT and OT Expected length of stay: ELOS 10 to 14 days Cultural Considerations: [name] Additional Information: Was at [place] 2015 for 7 days after CVA Pt/Family Agrees to Admission and willing to participate: Yes Program Orientation Provided & Reviewed with Pt/Caregiver Including Roles  & Responsibilities: Yes  Decrease burden of Care through IP rehab admission: n/a  Possible  need for SNF placement upon discharge: not anticipated Patient Condition: I have reviewed medical records from [facility], spoken with CM, and patient and son. I met with patient at the bedside for inpatient rehabilitation assessment.  Patient will benefit from ongoing PT and OT, can actively participate in 3 hours of therapy a day 5 days of the week, and can make measurable gains during the admission.  Patient will also benefit from the coordinated team approach during an Inpatient Acute Rehabilitation admission.  The patient will receive intensive therapy as well as Rehabilitation physician, nursing, social worker, and care management interventions.  Due to bladder management, bowel management, safety, skin/wound care, disease management, medication administration, pain management, and patient education the patient requires 24 hour a day rehabilitation nursing.  The patient is currently mod assist overall with mobility and basic ADLs.  Discharge setting and therapy post discharge at home with home health is anticipated.  Patient has agreed to participate in the Acute Inpatient Rehabilitation Program and will admit today.  Preadmission Screen Completed By:  Cleatrice Burke, 10/04/2022 11:16 AM ______________________________________________________________________   Discussed status with Dr. Tressa Busman on 10/04/22 at 1117 and received approval for admission today.  Admission Coordinator:  Cleatrice Burke, RN, time 5035 Date 10/04/22   Assessment/Plan: Diagnosis: Does the need for close, 24 hr/day Medical supervision in concert with the patient's rehab needs make it unreasonable for this patient to be served in a less intensive setting? Yes Co-Morbidities requiring supervision/potential complications: atrial fibrillation with RVR, heart failure, PE/DVT on heparin drip, AHRF with PE and bilateral pleural effusion, upper GI bleed, UTI, typpe 2 diabetes, Htn, thrombocytopenia, seizures,  and severe tremors Due to bladder management, bowel management, skin/wound care, disease management, medication administration, pain management, and patient education, does the patient require 24 hr/day rehab nursing? Yes Does the patient require coordinated care of a physician, rehab nurse, PT, OT to address physical and functional deficits in the context of the above medical diagnosis(es)? Yes Addressing deficits in the following areas: balance, endurance, locomotion, strength, transferring, bowel/bladder control, bathing, dressing, feeding, grooming, and toileting Can the patient actively participate in an intensive therapy program of at least 3 hrs of therapy 5 days a week? Yes The potential for patient to make measurable gains while on inpatient rehab is good Anticipated functional outcomes upon discharge from inpatient rehab: min assist PT, min assist OT Estimated rehab length of stay to reach the above functional goals is: 10-14 days Anticipated discharge destination: Home 10. Overall Rehab/Functional Prognosis: excellent   MD Signature:  [name], DO 10/04/2022

## 2022-10-03 NOTE — Progress Notes (Signed)
FMTS Interim Progress Note  Consulted Dr. [name] with IR about patient's DVT in R posterior tibial vein given patient's granddaughter [name] [name] voiced concern for need of IVC filter instead of anticoagulation given her history of GI bleed and overall clinical status. Discussed with Dr. [name] how the DVT is very distal and has lower chance of mobilizing. After speaking with colleagues, he recommended obtaining a repeat DVT US of the extremity in 4 days. If the DVT has progressed, he would recommend reconsulting IR for IVC placement. If it is stable at that time, then repeat US at the 2 week mark (from initial [place] on 11/8). Again, if it has progressed, an IVC could be placed. Otherwise, there is no need for further intervention.  [name] [name] also brought up concern for PE given her grandmother has persistent 2L O2 requirement. Patient has not endorsed chest pain, and her pleural effusions are likely playing a role in her dyspnea. However, given DVT and shared decision making, will proceed with CTA PE to rule out PE.  In the afternoon, I spoke with [name] [name], the patient's other granddaughter. Updated her with IR recommendations, and she is agreeable. Discussed that if CTA PE negative, her grandmother can be discharged to [facility] as early as tomorrow, 11/10.  [name], MD 10/03/2022, 2:48 PM PGY-1, [facility] Service pager [phone]

## 2022-10-03 NOTE — Progress Notes (Signed)
Inpatient Rehabilitation Admissions Coordinator   I await medical readiness to admit to CIR. Discussed with Dr Riley Kill and she is felt to be a good candidate.  Ottie Glazier, RN, MSN Rehab Admissions Coordinator [phone] 10/03/2022 3:52 PM

## 2022-10-03 NOTE — Consult Note (Signed)
Ref: [name], MD   Subjective:  Awake. VS stable. Monitor: Sinus rhythm. RLE DVT. LFTs are trending downward.   Objective:  Vital Signs in the last 24 hours: Temp:  [97.7 F (36.5 C)-98.4 F (36.9 C)] 97.7 F (36.5 C) (11/09 0809) Pulse Rate:  [64-73] 70 (11/09 1005) Cardiac Rhythm: Normal sinus rhythm;Heart block (11/09 0700) Resp:  [13-33] 33 (11/09 0809) BP: (103-127)/(41-68) 114/43 (11/09 0809) SpO2:  [99 %-100 %] 99 % (11/09 0809) Weight:  [77.1 kg] 77.1 kg (11/09 0527)  Physical Exam: BP Readings from Last 1 Encounters:  10/03/22 (!) 114/43     Wt Readings from Last 1 Encounters:  10/03/22 77.1 kg    Weight change: -2 kg Body mass index is 33.2 kg/m. HEENT: [place]/AT, Eyes-Brown, Conjunctiva-Pale, Sclera-Non-icteric Neck: No JVD, No bruit, Trachea midline. Lungs:  Clearing, Bilateral. Cardiac:  Regular rhythm, normal S1 and S2, no S3. II/VI systolic murmur. Abdomen:  Soft, non-tender. BS present. Extremities:  No edema present. No cyanosis. No clubbing. CNS: AxOx3, Cranial nerves grossly intact, moves both upper extremities.  Skin: Warm and dry.   Intake/Output from previous day: 11/08 0701 - 11/09 0700 In: -  Out: 400 [Urine:400]    Lab Results: BMET    Component Value Date/Time   NA 135 10/03/2022 0135   NA 134 (L) 10/02/2022 0148   NA 135 10/01/2022 0644   NA 141 06/14/2022 1505   NA 141 04/26/2020 1708   NA 141 06/22/2018 1448   K 4.3 10/03/2022 0135   K 4.4 10/02/2022 0148   K 4.4 10/01/2022 0644   CL 91 (L) 10/03/2022 0135   CL 93 (L) 10/02/2022 0148   CL 89 (L) 10/01/2022 0644   CO2 35 (H) 10/03/2022 0135   CO2 33 (H) 10/02/2022 0148   CO2 32 10/01/2022 0644   GLUCOSE 132 (H) 10/03/2022 0135   GLUCOSE 112 (H) 10/02/2022 0148   GLUCOSE 121 (H) 10/01/2022 0644   BUN 17 10/03/2022 0135   BUN 18 10/02/2022 0148   BUN 16 10/01/2022 0644   BUN 17 06/14/2022 1505   BUN 19 04/26/2020 1708   BUN 14 06/22/2018 1448   CREATININE 0.75  10/03/2022 0135   CREATININE 0.66 10/02/2022 0148   CREATININE 0.67 10/01/2022 0644   CALCIUM 8.6 (L) 10/03/2022 0135   CALCIUM 8.5 (L) 10/02/2022 0148   CALCIUM 8.5 (L) 10/01/2022 0644   GFRNONAA >60 10/03/2022 0135   GFRNONAA >60 10/02/2022 0148   GFRNONAA >60 10/01/2022 0644   GFRAA 69 04/26/2020 1708   GFRAA >60 06/15/2019 0841   GFRAA >60 11/13/2018 1941   CBC    Component Value Date/Time   WBC 9.9 10/01/2022 0644   RBC 3.04 (L) 10/01/2022 0644   HGB 9.4 (L) 10/01/2022 0644   HGB 15.2 04/26/2020 1708   HCT 28.5 (L) 10/01/2022 0644   HCT 46.5 04/26/2020 1708   PLT 172 10/01/2022 0644   PLT 102 (L) 04/26/2020 1708   MCV 93.8 10/01/2022 0644   MCV 94 04/26/2020 1708   MCH 30.9 10/01/2022 0644   MCHC 33.0 10/01/2022 0644   RDW 14.8 10/01/2022 0644   RDW 12.8 04/26/2020 1708   LYMPHSABS 1.5 10/01/2022 0644   LYMPHSABS 1.6 04/26/2020 1708   MONOABS 0.8 10/01/2022 0644   EOSABS 0.2 10/01/2022 0644   EOSABS 0.0 04/26/2020 1708   BASOSABS 0.1 10/01/2022 0644   BASOSABS 0.0 04/26/2020 1708   HEPATIC Function Panel Recent Labs    10/01/22 0644 10/02/22 0148 10/03/22  0135  PROT 5.9* 5.8* 5.8*  ALBUMIN 2.6* 2.6* 2.7*  AST 159* 149* 101*  ALT 191* 197* 160*  ALKPHOS 96 95 88   HEMOGLOBIN A1C Lab Results  Component Value Date   MPG 126 (H) 07/18/2014   CARDIAC ENZYMES Lab Results  Component Value Date   TROPONINI <0.30 03/24/2012   BNP No results for input(s): "PROBNP" in the last 8760 hours. TSH Recent Labs    09/16/22 1929  TSH 1.792   CHOLESTEROL Recent Labs    06/14/22 1505 09/17/22 0207  CHOL 180 161    Scheduled Meds:  amiodarone  200 mg Oral Daily   atorvastatin  10 mg Oral QPM   cefdinir  300 mg Oral Q12H   Chlorhexidine Gluconate Cloth  6 each Topical Daily   cycloSPORINE  1 drop Both Eyes BID   digoxin  0.0625 mg Oral Daily   diltiazem  30 mg Oral Q8H   DULoxetine  20 mg Oral Daily   fentaNYL (SUBLIMAZE) injection  100 mcg  Intravenous Once   gabapentin  100 mg Oral QHS   heparin injection (subcutaneous)  5,000 Units Subcutaneous Q8H   lactose free nutrition  237 mL Oral TID WC   levETIRAcetam  500 mg Oral BID   magnesium oxide  400 mg Oral Daily   metoprolol succinate  50 mg Oral QPM   multivitamin with minerals  1 tablet Oral Daily   mouth rinse  15 mL Mouth Rinse 4 times per day   pantoprazole  40 mg Oral BID   polyethylene glycol  17 g Oral Daily   potassium chloride  10 mEq Oral BID   torsemide  20 mg Oral Daily   Continuous Infusions:  sodium chloride Stopped (09/26/22 1457)   PRN Meds:.diclofenac Sodium, guaiFENesin, mouth rinse, senna, simethicone  Assessment/Plan: Paroxysmal atrial fibrillation Acute on chronic diastolic left heart failure, stable Acute respiratory failure with hypoxia, stable Acute DVT of Right lower extremity Abdominal pain Bilateral hip pain Colonic Ileus Recent GI bleed Gastric ulcer HTN HLD Old stroke Obesity Arthritis Parkinson's disease  Plan: Consider IV heparin without bolus if Okay with GI and family.  Possible thrombectomy/Filter placement   LOS: 16 days   Time spent including chart review, lab review, examination, discussion with patient/Family : 30 min   Dixie Dials  MD  10/03/2022, 10:42 AM

## 2022-10-03 NOTE — Progress Notes (Signed)
FMTS Interim Progress Note  CTA chest findings of acute on chronic PE with scatted areas of likely acute superimposed PE in the bilateral upper lobes and left lower lobe. Presence of right heart strain as well. Given these findings, consulted PCCM. Spoke with Dr. Merrily Pew who recommends anticoagulation if the family is amendable to this. He believes this is all mostly chronic and that patient will benefit from anticoagulation. Appreciate recommendations from Dr. Merrily Pew and the PCCM team's involvement.   Reece Leader, DO 10/03/2022, 7:12 PM PGY-3, [facility] Family Medicine Service pager [phone]

## 2022-10-04 ENCOUNTER — Inpatient Hospital Stay (HOSPITAL_COMMUNITY)
Admission: RE | Admit: 2022-10-04 | Discharge: 2022-10-23 | DRG: 945 | Disposition: A | Discharge status: Home Health | Payer: Medicare Other | Source: Intra-hospital | Attending: Physical Medicine and Rehabilitation | Admitting: Physical Medicine and Rehabilitation

## 2022-10-04 ENCOUNTER — Other Ambulatory Visit (HOSPITAL_COMMUNITY): Payer: Self-pay

## 2022-10-04 ENCOUNTER — Encounter (HOSPITAL_COMMUNITY): Payer: Self-pay | Admitting: Physical Medicine and Rehabilitation

## 2022-10-04 ENCOUNTER — Other Ambulatory Visit: Payer: Self-pay

## 2022-10-04 DIAGNOSIS — K567 Ileus, unspecified: Secondary | ICD-10-CM | POA: Diagnosis present

## 2022-10-04 DIAGNOSIS — I5033 Acute on chronic diastolic (congestive) heart failure: Secondary | ICD-10-CM | POA: Diagnosis present

## 2022-10-04 DIAGNOSIS — G4700 Insomnia, unspecified: Secondary | ICD-10-CM | POA: Diagnosis present

## 2022-10-04 DIAGNOSIS — G250 Essential tremor: Secondary | ICD-10-CM | POA: Diagnosis present

## 2022-10-04 DIAGNOSIS — F419 Anxiety disorder, unspecified: Secondary | ICD-10-CM | POA: Diagnosis present

## 2022-10-04 DIAGNOSIS — E860 Dehydration: Secondary | ICD-10-CM | POA: Diagnosis present

## 2022-10-04 DIAGNOSIS — Z823 Family history of stroke: Secondary | ICD-10-CM

## 2022-10-04 DIAGNOSIS — G40109 Localization-related (focal) (partial) symptomatic epilepsy and epileptic syndromes with simple partial seizures, not intractable, without status epilepticus: Secondary | ICD-10-CM

## 2022-10-04 DIAGNOSIS — E119 Type 2 diabetes mellitus without complications: Secondary | ICD-10-CM | POA: Diagnosis present

## 2022-10-04 DIAGNOSIS — I480 Paroxysmal atrial fibrillation: Secondary | ICD-10-CM | POA: Diagnosis present

## 2022-10-04 DIAGNOSIS — D500 Iron deficiency anemia secondary to blood loss (chronic): Secondary | ICD-10-CM | POA: Diagnosis present

## 2022-10-04 DIAGNOSIS — G20A1 Parkinson's disease without dyskinesia, without mention of fluctuations: Secondary | ICD-10-CM | POA: Diagnosis present

## 2022-10-04 DIAGNOSIS — I2699 Other pulmonary embolism without acute cor pulmonale: Secondary | ICD-10-CM

## 2022-10-04 DIAGNOSIS — R202 Paresthesia of skin: Secondary | ICD-10-CM | POA: Diagnosis present

## 2022-10-04 DIAGNOSIS — M79604 Pain in right leg: Secondary | ICD-10-CM | POA: Diagnosis not present

## 2022-10-04 DIAGNOSIS — M25562 Pain in left knee: Secondary | ICD-10-CM | POA: Diagnosis present

## 2022-10-04 DIAGNOSIS — N390 Urinary tract infection, site not specified: Secondary | ICD-10-CM | POA: Diagnosis present

## 2022-10-04 DIAGNOSIS — G8929 Other chronic pain: Secondary | ICD-10-CM | POA: Diagnosis present

## 2022-10-04 DIAGNOSIS — Z7189 Other specified counseling: Secondary | ICD-10-CM | POA: Diagnosis not present

## 2022-10-04 DIAGNOSIS — Z86718 Personal history of other venous thrombosis and embolism: Secondary | ICD-10-CM | POA: Diagnosis not present

## 2022-10-04 DIAGNOSIS — G40909 Epilepsy, unspecified, not intractable, without status epilepticus: Secondary | ICD-10-CM | POA: Diagnosis present

## 2022-10-04 DIAGNOSIS — K922 Gastrointestinal hemorrhage, unspecified: Secondary | ICD-10-CM | POA: Diagnosis not present

## 2022-10-04 DIAGNOSIS — E7800 Pure hypercholesterolemia, unspecified: Secondary | ICD-10-CM | POA: Diagnosis present

## 2022-10-04 DIAGNOSIS — Z8249 Family history of ischemic heart disease and other diseases of the circulatory system: Secondary | ICD-10-CM

## 2022-10-04 DIAGNOSIS — J9601 Acute respiratory failure with hypoxia: Secondary | ICD-10-CM | POA: Diagnosis present

## 2022-10-04 DIAGNOSIS — R5381 Other malaise: Principal | ICD-10-CM | POA: Diagnosis present

## 2022-10-04 DIAGNOSIS — I2782 Chronic pulmonary embolism: Secondary | ICD-10-CM | POA: Diagnosis present

## 2022-10-04 DIAGNOSIS — I824Y1 Acute embolism and thrombosis of unspecified deep veins of right proximal lower extremity: Secondary | ICD-10-CM | POA: Diagnosis not present

## 2022-10-04 DIAGNOSIS — I9589 Other hypotension: Secondary | ICD-10-CM | POA: Diagnosis present

## 2022-10-04 DIAGNOSIS — R7989 Other specified abnormal findings of blood chemistry: Secondary | ICD-10-CM | POA: Diagnosis not present

## 2022-10-04 DIAGNOSIS — M1711 Unilateral primary osteoarthritis, right knee: Secondary | ICD-10-CM | POA: Diagnosis present

## 2022-10-04 DIAGNOSIS — Z8673 Personal history of transient ischemic attack (TIA), and cerebral infarction without residual deficits: Secondary | ICD-10-CM | POA: Diagnosis not present

## 2022-10-04 DIAGNOSIS — I110 Hypertensive heart disease with heart failure: Secondary | ICD-10-CM | POA: Diagnosis present

## 2022-10-04 DIAGNOSIS — K219 Gastro-esophageal reflux disease without esophagitis: Secondary | ICD-10-CM | POA: Diagnosis present

## 2022-10-04 DIAGNOSIS — Z9842 Cataract extraction status, left eye: Secondary | ICD-10-CM

## 2022-10-04 DIAGNOSIS — Z91048 Other nonmedicinal substance allergy status: Secondary | ICD-10-CM

## 2022-10-04 DIAGNOSIS — Z66 Do not resuscitate: Secondary | ICD-10-CM | POA: Diagnosis present

## 2022-10-04 DIAGNOSIS — Z515 Encounter for palliative care: Secondary | ICD-10-CM | POA: Diagnosis not present

## 2022-10-04 DIAGNOSIS — M79671 Pain in right foot: Secondary | ICD-10-CM | POA: Diagnosis not present

## 2022-10-04 DIAGNOSIS — I4891 Unspecified atrial fibrillation: Secondary | ICD-10-CM | POA: Diagnosis not present

## 2022-10-04 DIAGNOSIS — M25561 Pain in right knee: Secondary | ICD-10-CM | POA: Diagnosis not present

## 2022-10-04 DIAGNOSIS — J9811 Atelectasis: Secondary | ICD-10-CM | POA: Diagnosis present

## 2022-10-04 DIAGNOSIS — D696 Thrombocytopenia, unspecified: Secondary | ICD-10-CM | POA: Diagnosis present

## 2022-10-04 DIAGNOSIS — M25571 Pain in right ankle and joints of right foot: Secondary | ICD-10-CM | POA: Diagnosis present

## 2022-10-04 DIAGNOSIS — Z79899 Other long term (current) drug therapy: Secondary | ICD-10-CM | POA: Diagnosis not present

## 2022-10-04 DIAGNOSIS — Z7902 Long term (current) use of antithrombotics/antiplatelets: Secondary | ICD-10-CM

## 2022-10-04 DIAGNOSIS — E669 Obesity, unspecified: Secondary | ICD-10-CM | POA: Diagnosis present

## 2022-10-04 DIAGNOSIS — I82541 Chronic embolism and thrombosis of right tibial vein: Secondary | ICD-10-CM | POA: Diagnosis not present

## 2022-10-04 DIAGNOSIS — I440 Atrioventricular block, first degree: Secondary | ICD-10-CM | POA: Diagnosis present

## 2022-10-04 DIAGNOSIS — Z6833 Body mass index (BMI) 33.0-33.9, adult: Secondary | ICD-10-CM

## 2022-10-04 DIAGNOSIS — M792 Neuralgia and neuritis, unspecified: Secondary | ICD-10-CM | POA: Diagnosis not present

## 2022-10-04 DIAGNOSIS — Z9841 Cataract extraction status, right eye: Secondary | ICD-10-CM

## 2022-10-04 DIAGNOSIS — Z603 Acculturation difficulty: Secondary | ICD-10-CM | POA: Diagnosis present

## 2022-10-04 LAB — URINE CULTURE: Culture: 80000 — AB

## 2022-10-04 LAB — HEPARIN LEVEL (UNFRACTIONATED)
Heparin Unfractionated: >1.1 IU/mL — ABNORMAL HIGH (ref 0.30–0.70)
Heparin Unfractionated: >1.1 IU/mL — ABNORMAL HIGH (ref 0.30–0.70)

## 2022-10-04 LAB — COMPREHENSIVE METABOLIC PANEL
ALT: 146 U/L — ABNORMAL HIGH (ref 0–44)
AST: 93 U/L — ABNORMAL HIGH (ref 15–41)
Albumin: 2.7 g/dL — ABNORMAL LOW (ref 3.5–5.0)
Alkaline Phosphatase: 101 U/L (ref 38–126)
Anion gap: 8 (ref 5–15)
BUN: 17 mg/dL (ref 8–23)
CO2: 32 mmol/L (ref 22–32)
Calcium: 8.3 mg/dL — ABNORMAL LOW (ref 8.9–10.3)
Chloride: 91 mmol/L — ABNORMAL LOW (ref 98–111)
Creatinine, Ser: 0.75 mg/dL (ref 0.44–1.00)
GFR, Estimated: >60 mL/min (ref >60)
Glucose, Bld: 131 mg/dL — ABNORMAL HIGH (ref 70–99)
Potassium: 4.1 mmol/L (ref 3.5–5.1)
Sodium: 131 mmol/L — ABNORMAL LOW (ref 135–145)
Total Bilirubin: 0.6 mg/dL (ref 0.3–1.2)
Total Protein: 5.9 g/dL — ABNORMAL LOW (ref 6.5–8.1)

## 2022-10-04 LAB — CBC
HCT: 28.2 % — ABNORMAL LOW (ref 36.0–46.0)
Hemoglobin: 9.2 g/dL — ABNORMAL LOW (ref 12.0–15.0)
MCH: 30.7 pg (ref 26.0–34.0)
MCHC: 32.6 g/dL (ref 30.0–36.0)
MCV: 94 fL (ref 80.0–100.0)
Platelets: 170 10*3/uL (ref 150–400)
RBC: 3 MIL/uL — ABNORMAL LOW (ref 3.87–5.11)
RDW: 14.6 % (ref 11.5–15.5)
WBC: 7.3 10*3/uL (ref 4.0–10.5)
nRBC: 0 % (ref 0.0–0.2)

## 2022-10-04 MED ORDER — DULOXETINE HCL 20 MG PO CPEP
20.0000 mg | ORAL_CAPSULE | Freq: Every day | ORAL | Status: DC
  Administered 2022-10-05 – 2022-10-09 (×5): 20 mg via ORAL
  Filled 2022-10-04 (×5): qty 1

## 2022-10-04 MED ORDER — LEVETIRACETAM 250 MG PO TABS
500.0000 mg | ORAL_TABLET | Freq: Two times a day (BID) | ORAL | Status: DC
  Administered 2022-10-04 – 2022-10-23 (×38): 500 mg via ORAL
  Filled 2022-10-04 (×39): qty 2

## 2022-10-04 MED ORDER — DILTIAZEM HCL 30 MG PO TABS
30.0000 mg | ORAL_TABLET | Freq: Three times a day (TID) | ORAL | 0 refills | Status: DC
  Filled 2022-10-04: qty 30, 10d supply, fill #0

## 2022-10-04 MED ORDER — TORSEMIDE 20 MG PO TABS
20.0000 mg | ORAL_TABLET | Freq: Every day | ORAL | Status: DC
  Administered 2022-10-05 – 2022-10-17 (×13): 20 mg via ORAL
  Filled 2022-10-04 (×13): qty 1

## 2022-10-04 MED ORDER — HEPARIN (PORCINE) 25000 UT/250ML-% IV SOLN: 900.0000 [IU]/h | INTRAVENOUS | Status: DC

## 2022-10-04 MED ORDER — DILTIAZEM HCL 30 MG PO TABS
30.0000 mg | ORAL_TABLET | Freq: Three times a day (TID) | ORAL | Status: DC
  Administered 2022-10-04 – 2022-10-09 (×14): 30 mg via ORAL
  Filled 2022-10-04 (×15): qty 1

## 2022-10-04 MED ORDER — SENNA 8.6 MG PO TABS
1.0000 | ORAL_TABLET | Freq: Every day | ORAL | Status: DC | PRN
  Administered 2022-10-07: 8.6 mg via ORAL
  Filled 2022-10-04: qty 1

## 2022-10-04 MED ORDER — METOPROLOL SUCCINATE ER 50 MG PO TB24
50.0000 mg | ORAL_TABLET | Freq: Every evening | ORAL | Status: DC
  Administered 2022-10-04 – 2022-10-21 (×16): 50 mg via ORAL
  Filled 2022-10-04 (×18): qty 1

## 2022-10-04 MED ORDER — FUROSEMIDE 20 MG PO TABS
20.0000 mg | ORAL_TABLET | ORAL | 3 refills | Status: DC
  Filled 2022-10-04: qty 30, 60d supply, fill #0

## 2022-10-04 MED ORDER — AMIODARONE HCL 200 MG PO TABS
ORAL_TABLET | ORAL | 0 refills | Status: DC
  Filled 2022-10-04: qty 21, 36d supply, fill #0

## 2022-10-04 MED ORDER — PANTOPRAZOLE SODIUM 40 MG PO TBEC
40.0000 mg | DELAYED_RELEASE_TABLET | Freq: Two times a day (BID) | ORAL | 0 refills | Status: DC
  Filled 2022-10-04: qty 90, 45d supply, fill #0

## 2022-10-04 MED ORDER — HEPARIN (PORCINE) 25000 UT/250ML-% IV SOLN
750.0000 [IU]/h | INTRAVENOUS | Status: DC
  Administered 2022-10-04: 900 [IU]/h via INTRAVENOUS
  Administered 2022-10-06: 700 [IU]/h via INTRAVENOUS
  Administered 2022-10-08 – 2022-10-10 (×2): 750 [IU]/h via INTRAVENOUS
  Filled 2022-10-04 (×6): qty 250

## 2022-10-04 MED ORDER — BOOST PLUS PO LIQD: 237.0000 mL | Freq: Three times a day (TID) | ORAL | 0 refills | Status: DC

## 2022-10-04 MED ORDER — SIMETHICONE 40 MG/0.6ML PO SUSP: 40.0000 mg | Freq: Four times a day (QID) | ORAL | Status: DC | PRN

## 2022-10-04 MED ORDER — PANTOPRAZOLE SODIUM 40 MG PO TBEC
40.0000 mg | DELAYED_RELEASE_TABLET | Freq: Two times a day (BID) | ORAL | Status: DC
  Administered 2022-10-04 – 2022-10-22 (×37): 40 mg via ORAL
  Filled 2022-10-04 (×38): qty 1

## 2022-10-04 MED ORDER — AMIODARONE HCL 200 MG PO TABS
200.0000 mg | ORAL_TABLET | Freq: Every day | ORAL | Status: DC
  Administered 2022-10-05 – 2022-10-09 (×5): 200 mg via ORAL
  Filled 2022-10-04 (×5): qty 1

## 2022-10-04 MED ORDER — ACETAMINOPHEN 325 MG PO TABS
325.0000 mg | ORAL_TABLET | ORAL | Status: DC | PRN
  Administered 2022-10-08 – 2022-10-15 (×10): 650 mg via ORAL
  Administered 2022-10-21: 325 mg via ORAL
  Administered 2022-10-21 – 2022-10-22 (×2): 650 mg via ORAL
  Filled 2022-10-04 (×14): qty 2

## 2022-10-04 MED ORDER — AMOXICILLIN 500 MG PO CAPS
500.0000 mg | ORAL_CAPSULE | Freq: Two times a day (BID) | ORAL | Status: DC
  Administered 2022-10-04: 500 mg via ORAL
  Filled 2022-10-04 (×2): qty 1

## 2022-10-04 MED ORDER — APIXABAN 5 MG PO TABS: 5.0000 mg | ORAL_TABLET | Freq: Two times a day (BID) | ORAL | Status: DC

## 2022-10-04 MED ORDER — CYCLOSPORINE 0.05 % OP EMUL
1.0000 [drp] | Freq: Two times a day (BID) | OPHTHALMIC | Status: DC
  Administered 2022-10-04 – 2022-10-22 (×37): 1 [drp] via OPHTHALMIC
  Filled 2022-10-04 (×38): qty 30

## 2022-10-04 MED ORDER — DIGOXIN 0.0625 MG HALF TABLET
0.0625 mg | ORAL_TABLET | Freq: Every day | ORAL | Status: DC
  Administered 2022-10-05 – 2022-10-22 (×18): 0.0625 mg via ORAL
  Filled 2022-10-04 (×19): qty 1

## 2022-10-04 MED ORDER — MAGNESIUM OXIDE -MG SUPPLEMENT 400 (240 MG) MG PO TABS
400.0000 mg | ORAL_TABLET | Freq: Every day | ORAL | Status: DC
  Administered 2022-10-05 – 2022-10-22 (×18): 400 mg via ORAL
  Filled 2022-10-04 (×19): qty 1

## 2022-10-04 MED ORDER — ATORVASTATIN CALCIUM 10 MG PO TABS
10.0000 mg | ORAL_TABLET | Freq: Every evening | ORAL | 0 refills | Status: DC
  Filled 2022-10-04: qty 30, 30d supply, fill #0

## 2022-10-04 MED ORDER — GUAIFENESIN 100 MG/5ML PO LIQD: 5.0000 mL | ORAL | Status: DC | PRN

## 2022-10-04 MED ORDER — DICLOFENAC SODIUM 1 % EX GEL
2.0000 g | Freq: Four times a day (QID) | CUTANEOUS | Status: DC | PRN
  Administered 2022-10-22: 2 g via TOPICAL
  Filled 2022-10-04: qty 100

## 2022-10-04 MED ORDER — TORSEMIDE 20 MG PO TABS
20.0000 mg | ORAL_TABLET | Freq: Every day | ORAL | 0 refills | Status: DC
  Filled 2022-10-04: qty 30, 30d supply, fill #0

## 2022-10-04 MED ORDER — POLYETHYLENE GLYCOL 3350 17 G PO PACK: 17.0000 g | PACK | Freq: Every day | ORAL | 0 refills | Status: DC

## 2022-10-04 MED ORDER — ADULT MULTIVITAMIN W/MINERALS CH: 1.0000 | ORAL_TABLET | Freq: Every day | ORAL | Status: AC

## 2022-10-04 MED ORDER — ATORVASTATIN CALCIUM 10 MG PO TABS
10.0000 mg | ORAL_TABLET | Freq: Every evening | ORAL | Status: DC
  Administered 2022-10-04 – 2022-10-22 (×19): 10 mg via ORAL
  Filled 2022-10-04 (×21): qty 1

## 2022-10-04 MED ORDER — ADULT MULTIVITAMIN W/MINERALS CH
1.0000 | ORAL_TABLET | Freq: Every day | ORAL | Status: DC
  Administered 2022-10-05 – 2022-10-22 (×18): 1 via ORAL
  Filled 2022-10-04 (×19): qty 1

## 2022-10-04 MED ORDER — GABAPENTIN 100 MG PO CAPS
100.0000 mg | ORAL_CAPSULE | Freq: Every day | ORAL | Status: DC
  Administered 2022-10-04 – 2022-10-10 (×7): 100 mg via ORAL
  Filled 2022-10-04 (×7): qty 1

## 2022-10-04 MED ORDER — DIGOXIN 62.5 MCG PO TABS
0.0625 mg | ORAL_TABLET | Freq: Every day | ORAL | 0 refills | Status: DC
  Filled 2022-10-04: qty 30, 30d supply, fill #0

## 2022-10-04 MED ORDER — MAGNESIUM OXIDE -MG SUPPLEMENT 400 (240 MG) MG PO TABS: 400.0000 mg | ORAL_TABLET | Freq: Every day | ORAL | Status: DC

## 2022-10-04 MED ORDER — SENNA 8.6 MG PO TABS: 1.0000 | ORAL_TABLET | Freq: Every day | ORAL | 0 refills | Status: DC | PRN

## 2022-10-04 MED ORDER — BOOST PLUS PO LIQD
237.0000 mL | Freq: Three times a day (TID) | ORAL | Status: DC
  Administered 2022-10-04 – 2022-10-18 (×33): 237 mL via ORAL
  Filled 2022-10-04 (×57): qty 237

## 2022-10-04 MED ORDER — AMOXICILLIN 500 MG PO CAPS
500.0000 mg | ORAL_CAPSULE | Freq: Two times a day (BID) | ORAL | 0 refills | Status: DC
  Filled 2022-10-04: qty 8, 4d supply, fill #0

## 2022-10-04 MED ORDER — POTASSIUM CHLORIDE CRYS ER 10 MEQ PO TBCR
10.0000 meq | EXTENDED_RELEASE_TABLET | Freq: Two times a day (BID) | ORAL | Status: DC
  Administered 2022-10-04 – 2022-10-08 (×9): 10 meq via ORAL
  Filled 2022-10-04 (×9): qty 1

## 2022-10-04 MED ORDER — METOPROLOL SUCCINATE ER 50 MG PO TB24
50.0000 mg | ORAL_TABLET | Freq: Every evening | ORAL | 0 refills | Status: DC
  Filled 2022-10-04: qty 30, 30d supply, fill #0

## 2022-10-04 MED ORDER — AMOXICILLIN 250 MG PO CAPS
500.0000 mg | ORAL_CAPSULE | Freq: Two times a day (BID) | ORAL | Status: AC
  Administered 2022-10-04 – 2022-10-08 (×8): 500 mg via ORAL
  Filled 2022-10-04 (×8): qty 2

## 2022-10-04 MED ORDER — FERROUS SULFATE 325 (65 FE) MG PO TABS
325.0000 mg | ORAL_TABLET | Freq: Every day | ORAL | 0 refills | Status: DC
  Filled 2022-10-04: qty 30, 30d supply, fill #0

## 2022-10-04 MED ORDER — POLYETHYLENE GLYCOL 3350 17 G PO PACK
17.0000 g | PACK | Freq: Every day | ORAL | Status: DC
  Administered 2022-10-05 – 2022-10-14 (×10): 17 g via ORAL
  Filled 2022-10-04 (×11): qty 1

## 2022-10-04 NOTE — Progress Notes (Signed)
Daily Progress Note Intern Pager: [phone]  Patient name: [name] [name] Medical record number: [id number] Date of birth: 08/16/1930 Age: 88 y.o. Gender: female  Primary Care Provider: Zola Button, MD Consultants: CCM, Cardiology, GI Code Status: Full   Pt Overview and Major Events to Date:  10/23: Admitted to [facility] 10/27: Admitted to CCM 10/29: CT head demonstrated no acute abnormalities 10/29: CXR moderate bilateral pleural effusions, R>L 10/31: TEE/DCCV performed  11/4: Back to FMTS from ICU 11/9: Evidence of acute on chronic PE   Assessment and Plan: Ms. [name] [name] is an 87 year old female with hx of HF who was admitted with new onset A-fib with RVR and volume overloaded. Eventually transferred to the ICU for hypoxic respiratory failure and hospitalization complicated by GI bleed. Pertinent PMH/PSH includes hypertension, HLD, seizures, essential tremor, diastolic heart failure and CVA.   * Atrial fibrillation with RVR ([place]) Patient on admission was found to have new onset A-fib with RVR.  Received TEE/DCCV on 10/31, s/p cardioversion. Rate controlled. - Cardiology following, appreciate continued recs - Diltiazem 30mg  QID, Lopressor 50 mg TID, amiodarone 200 mg now qd 11/6, and digoxin 0.0625 mg daily per cardiology - Continue Cardiac monitoring - heparin for DVT ppx, will monitor closely for bleeding and discontinue as appropriate    Acute on chronic pulmonary embolism As seen on CT. Appears most areas are chronic. Suspect 2/2 prolonged immobilization and overall age. Discussed anticoagulation with family who agreed on heparin drip without bolus given Gi bleed earlier in admission. -Continue heparin drip -Monitor O2 status, currently remains on 2L [place]  Bilateral pleural effusion As seen on CXR and CTAP. Appears stable, though unclear cause, and patient is still in 2L [place]. Suspect potential afib with RVR that sent her to ICU could have precipitated fluid buildup.  Pulm with recs, after speaking with family, to avoid thoracentesis given painful procedures and not guaranteed she would come off of O2. -Monitor clinical status -O2 as needed  UTI (urinary tract infection) Patient with suprapubic pain and dark urine from catheter. UA on 10/29 with positive nitrites and large Hgb. UA yesterday 11/8 suspicious for UTI. Ucx pending. Started cefdinir 300 mg BID for UTI. -Continue cefdinir (day 2/5)  Abdominal pain Abdominal US normal. Still experiencing abdominal pain, now with suprapubic tenderness. CTAP with findings suggestive of ileus and endometrial distention. Of note, LFTs have been persistently elevated over admission. RUQ [place] was obtained and showed normal gallbladder and common bile duct, so do not suspect this is cause for abdominal pain. LFTs could also be elevated 2/2 amiodarone. Hepatitis panel negative. -continue miralax -OOB with PT/OT to mobilize bowel contents -Cardiology to reduce statin by 50% and will reduce amio dose by 50% in 1 week, will assess LFTs  Bilateral hip pain with RLE DVT CTAP negative for [facility] pathology. Suspect pain in LLE 2/2 deconditioning vs post-CVA. However, new and acute RLE pain found to be DVT as seen on [place]. -OOB with PT/OT for conditioning -Discussed serial [place] on 11/13 and 11/22 -Anticoagulation per PE   Acute on chronic diastolic heart failure (HCC) Remains euvolemic on exam, stable on 2L. Down 6.5L since admission.  - Cardiology following, appreciate recs - Strict I's and O's, daily weight - Continue Torsemide 20mg  daily -wean O2 as tolerated  Swelling of right upper extremity Right arm swelling that started a few days ago, DVT US notable for superficial thrombosis of right basilar vein. Thrombosis should resolve on its own over time, though will try  conservative management until then. -continue alternating cold and warm compress -apply voltaren gel to area for pain control -continue to monitor closely   -consider abx if patient becomes febrile  Anemia Likely initially secondary to GI bleed. EGD demonstrated nonbleeding ulcer. Hgb stable. Will continue PPI given GI bleed. -GI following, appreciate recs -Continue daily am CBC -Protonix 40 mg twice daily -monitor for signs of bleeding  -Transfusion threshold <7    Essential tremor Stable. Still have head tremors with is present at baseline. Home medication includes Xanax and primidone -Holding home Xanax -f/u neuro outpatient   FEN/GI: dysphagia 3 diet  PPx: heparin subq Dispo:Likely CIR pending clinical improvement  Subjective:  Doing well this morning on heparin for PE. She feels she is breathing well. Her family would like for her to go to CIR on Monday if possible to give her time for the blood thinners, though they are okay with sooner if the CIR bed will be lost.  Objective: Temp:  [97.7 F (36.5 C)-98.5 F (36.9 C)] 98.5 F (36.9 C) (11/10 0408) Pulse Rate:  [60-80] 60 (11/10 0408) Resp:  [13-33] 13 (11/10 0408) BP: (114-154)/(43-59) 141/45 (11/10 0408) SpO2:  [99 %-100 %] 99 % (11/10 0408) Physical Exam: General: Alert and oriented, in NAD Skin: Warm, dry HEENT: NCAT, EOM grossly normal, midline nasal septum Cardiac: RRR, no m/r/g appreciated Respiratory: CTAB anteriorly, breathing and speaking comfortably on 1L [place] Abdominal: Soft, mildly TTP throughout, nondistended, normoactive bowel sounds Extremities: Moves all extremities grossly equally in bed though weak Neurological: No gross focal deficit Psychiatric: Appropriate mood and affect   Laboratory: Most recent CBC Lab Results  Component Value Date   WBC 7.3 10/04/2022   HGB 9.2 (L) 10/04/2022   HCT 28.2 (L) 10/04/2022   MCV 94.0 10/04/2022   PLT 170 10/04/2022   Most recent BMP    Latest Ref Rng & Units 10/04/2022    6:22 AM  BMP  Glucose 70 - 99 mg/dL 643   BUN 8 - 23 mg/dL 17   Creatinine 3.29 - 1.00 mg/dL 5.18   Sodium 841 - 660 mmol/L 131    Potassium 3.5 - 5.1 mmol/L 4.1   Chloride 98 - 111 mmol/L 91   CO2 22 - 32 mmol/L 32   Calcium 8.9 - 10.3 mg/dL 8.3    Imaging/Diagnostic Tests:  CTA PE IMPRESSION: 1. Acute on chronic pulmonary embolus with the bulk of the filling defect in the pulmonary arterial tree peripheral compatible with chronic embolus. Scattered areas of likely acute superimposed pulmonary embolus in the bilateral upper lobes and left lower lobe. Positive for acute PE with CT evidence of right heart strain (RV/LV Ratio = 1.3 ) consistent with at least submassive (intermediate risk) PE. The presence of right heart strain has been associated with an increased risk of morbidity and mortality. Please refer to the "Code PE Focused" order set in EPIC. 2. Moderate right and trace left pleural effusion with passive atelectasis. 3. Airway thickening and airway plugging in the left lower lobe. 4. There is some focal consolidation anteriorly in the left upper lobe which is nonspecific. However this could be from a small pulmonary infarct. 5. Left ventricular hypertrophy and cardiomegaly. 6. Coronary and aortic atherosclerosis along with atherosclerosis in the proximal superior mesenteric artery. Mild mitral and aortic valve calcification. 7. Chronic L1 compression fracture.  [name], MD 10/04/2022, 7:38 AM PGY-1, [facility] Family Medicine FPTS Intern pager: [phone], text pages welcome Secure chat group [facility] Family Medicine  Hospital Teaching Service

## 2022-10-04 NOTE — Progress Notes (Signed)
Inpatient Rehabilitation Admissions Coordinator   I have received approval for discharge to Cir today by Attending service. I met with patient and her son at bedside and they are in agreement to admit today. I have alerted acute team and TOC and will make the arrangements to admit today.   , RN, MSN Rehab Admissions Coordinator [phone] 10/04/2022 11:10 AM  

## 2022-10-04 NOTE — Progress Notes (Signed)
Hold eliquis after double check with unit pharmacist.   Lawson Radar, RN

## 2022-10-04 NOTE — Consult Note (Signed)
Ref: Zola Button, MD   Subjective:  Awake. VS stable.  Monitor: Sinus rhythm  Objective:  Vital Signs in the last 24 hours: Temp:  [97.7 F (36.5 C)-98.5 F (36.9 C)] 98.4 F (36.9 C) (11/10 1000) Pulse Rate:  [60-80] 66 (11/10 1000) Cardiac Rhythm: Normal sinus rhythm;Heart block (11/10 0813) Resp:  [13-20] 20 (11/10 1000) BP: (128-154)/(45-59) 134/51 (11/10 1000) SpO2:  [96 %-100 %] 96 % (11/10 1000)  Physical Exam: BP Readings from Last 1 Encounters:  10/04/22 (!) 134/51     Wt Readings from Last 1 Encounters:  10/03/22 77.1 kg    Weight change:  Body mass index is 33.2 kg/m. HEENT: [place]/AT, Eyes-Brown, Conjunctiva-Pale, Sclera-Non-icteric Neck: No JVD, No bruit, Trachea midline. Lungs:  Clear, Bilateral. Cardiac:  Regular rhythm, normal S1 and S2, no S3. II/VI systolic murmur. Abdomen:  Soft, non-tender. BS present. Extremities:  No edema present. No cyanosis. No clubbing. CNS: AxOx3, Cranial nerves grossly intact, moves all 4 extremities.  Skin: Warm and dry.   Intake/Output from previous day: 11/09 0701 - 11/10 0700 In: 240 [P.O.:240] Out: 1450 [Urine:1450]    Lab Results: BMET    Component Value Date/Time   NA 131 (L) 10/04/2022 0622   NA 135 10/03/2022 0135   NA 134 (L) 10/02/2022 0148   NA 141 06/14/2022 1505   NA 141 04/26/2020 1708   NA 141 06/22/2018 1448   K 4.1 10/04/2022 0622   K 4.3 10/03/2022 0135   K 4.4 10/02/2022 0148   CL 91 (L) 10/04/2022 0622   CL 91 (L) 10/03/2022 0135   CL 93 (L) 10/02/2022 0148   CO2 32 10/04/2022 0622   CO2 35 (H) 10/03/2022 0135   CO2 33 (H) 10/02/2022 0148   GLUCOSE 131 (H) 10/04/2022 0622   GLUCOSE 132 (H) 10/03/2022 0135   GLUCOSE 112 (H) 10/02/2022 0148   BUN 17 10/04/2022 0622   BUN 17 10/03/2022 0135   BUN 18 10/02/2022 0148   BUN 17 06/14/2022 1505   BUN 19 04/26/2020 1708   BUN 14 06/22/2018 1448   CREATININE 0.75 10/04/2022 0622   CREATININE 0.75 10/03/2022 0135   CREATININE 0.66 10/02/2022  0148   CALCIUM 8.3 (L) 10/04/2022 0622   CALCIUM 8.6 (L) 10/03/2022 0135   CALCIUM 8.5 (L) 10/02/2022 0148   GFRNONAA >60 10/04/2022 0622   GFRNONAA >60 10/03/2022 0135   GFRNONAA >60 10/02/2022 0148   GFRAA 69 04/26/2020 1708   GFRAA >60 06/15/2019 0841   GFRAA >60 11/13/2018 1941   CBC    Component Value Date/Time   WBC 7.3 10/04/2022 0622   RBC 3.00 (L) 10/04/2022 0622   HGB 9.2 (L) 10/04/2022 0622   HGB 15.2 04/26/2020 1708   HCT 28.2 (L) 10/04/2022 0622   HCT 46.5 04/26/2020 1708   PLT 170 10/04/2022 0622   PLT 102 (L) 04/26/2020 1708   MCV 94.0 10/04/2022 0622   MCV 94 04/26/2020 1708   MCH 30.7 10/04/2022 0622   MCHC 32.6 10/04/2022 0622   RDW 14.6 10/04/2022 0622   RDW 12.8 04/26/2020 1708   LYMPHSABS 1.5 10/01/2022 0644   LYMPHSABS 1.6 04/26/2020 1708   MONOABS 0.8 10/01/2022 0644   EOSABS 0.2 10/01/2022 0644   EOSABS 0.0 04/26/2020 1708   BASOSABS 0.1 10/01/2022 0644   BASOSABS 0.0 04/26/2020 1708   HEPATIC Function Panel Recent Labs    10/02/22 0148 10/03/22 0135 10/04/22 0622  PROT 5.8* 5.8* 5.9*  ALBUMIN 2.6* 2.7* 2.7*  AST 149*  101* 93*  ALT 197* 160* 146*  ALKPHOS 95 88 101   HEMOGLOBIN A1C Lab Results  Component Value Date   MPG 126 (H) 07/18/2014   CARDIAC ENZYMES Lab Results  Component Value Date   TROPONINI <0.30 03/24/2012   BNP No results for input(s): "PROBNP" in the last 8760 hours. TSH Recent Labs    09/16/22 1929  TSH 1.792   CHOLESTEROL Recent Labs    06/14/22 1505 09/17/22 0207  CHOL 180 161    Scheduled Meds:  acetaminophen  500 mg Oral Q6H   amiodarone  200 mg Oral Daily   amoxicillin  500 mg Oral Q12H   apixaban  5 mg Oral BID   atorvastatin  10 mg Oral QPM   Chlorhexidine Gluconate Cloth  6 each Topical Daily   cycloSPORINE  1 drop Both Eyes BID   digoxin  0.0625 mg Oral Daily   diltiazem  30 mg Oral Q8H   DULoxetine  20 mg Oral Daily   gabapentin  100 mg Oral QHS   lactose free nutrition  237 mL Oral  TID WC   levETIRAcetam  500 mg Oral BID   magnesium oxide  400 mg Oral Daily   metoprolol succinate  50 mg Oral QPM   multivitamin with minerals  1 tablet Oral Daily   mouth rinse  15 mL Mouth Rinse 4 times per day   pantoprazole  40 mg Oral BID   polyethylene glycol  17 g Oral Daily   potassium chloride  10 mEq Oral BID   torsemide  20 mg Oral Daily   Continuous Infusions:  sodium chloride Stopped (09/26/22 1457)   PRN Meds:.diclofenac Sodium, guaiFENesin, mouth rinse, senna, simethicone  Assessment/Plan: Paroxysmal atrial fibrillation Acute on chronic diastolic left heart failure, stable Acute respiratory failure with hypoxia, stable Acute right lower extremity DVT Abdominal pain Colonic ileus Bilateral hip pain Recent GI bleed Gastric ulcer HTN HLD Old stroke Obesity Arthritis Parkinson's disease  Plan: Continue IV heparin. Awaiting rehab.   LOS: 17 days   Time spent including chart review, lab review, examination, discussion with patient/Family/Nurse : 30 min   Orpah Cobb  MD  10/04/2022, 10:46 AM

## 2022-10-04 NOTE — Plan of Care (Signed)
  Problem: Consults Goal: RH GENERAL PATIENT EDUCATION Description: See Patient Education module for education specifics. Outcome: Progressing   Problem: RH BOWEL ELIMINATION Goal: RH STG MANAGE BOWEL WITH ASSISTANCE Description: STG Manage Bowel with mod I Assistance. Outcome: Progressing Goal: RH STG MANAGE BOWEL W/MEDICATION W/ASSISTANCE Description: STG Manage Bowel with Medication with mod I  Assistance. Outcome: Progressing   Problem: RH BLADDER ELIMINATION Goal: RH STG MANAGE BLADDER WITH ASSISTANCE Description: STG Manage Bladder With toileting Assistance Outcome: Progressing   Problem: RH SAFETY Goal: RH STG ADHERE TO SAFETY PRECAUTIONS W/ASSISTANCE/DEVICE Description: STG Adhere to Safety Precautions With cues Assistance/Device. Outcome: Progressing   Problem: RH PAIN MANAGEMENT Goal: RH STG PAIN MANAGED AT OR BELOW PT'S PAIN GOAL Description: < 4 with prns Outcome: Progressing   Problem: RH KNOWLEDGE DEFICIT GENERAL Goal: RH STG INCREASE KNOWLEDGE OF SELF CARE AFTER HOSPITALIZATION Description: Patient and son will be able to manage care at discharge using educational handouts independently Outcome: Progressing

## 2022-10-04 NOTE — H&P (Signed)
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  Per chart review patient lives with her children.  1 level home one-step to entry.  Uses a rolling walker for mobility working with outpatient physical therapy.  She has an aide assisting 1 time a week for bathing.  Presented 09/16/2022 with generalized weakness x2 days as well as increased shortness of breath.  Chest x-ray showed increased mild to moderate right and unchanged tiny left pleural effusions.  Chemistries unremarkable except glucose 114, GFR 56, troponin negative, BNP 465.  EKG showed atrial fibrillation with RVR and placed on intravenous Cardizem.  Echocardiogram with ejection fraction of 55 to 60% no wall motion abnormalities.   Patient initially scheduled for cardioversion by cardiology services but subsequently developed acute respiratory distress requiring noninvasive imaging treated with aggressive diuresis.  Course complicated by upper GI bleed due to gastric ulcer with follow-up gastroenterology services undergoing upper GI endoscopy 09/26/2022 per Dr. Lyndel Safe and placed on PPI twice daily x12 weeks.  Noted bouts of confusion initial restlessness CT of the head completed showing no acute findings 09/22/2022.  Patient stabilized underwent TEE/DCCV 09/24/2022 per cardiology services and currently maintained on  amiodarone and Toprol as well as Cardizem/Lanoxin.  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  Patient was cleared to begin intravenous heparin and transitioned to Eliquis.  IV diuresis transitioned to Demadex 20 mg p.o. daily.   Intermittent bouts of abdominal pain with CT of the abdomen 09/30/2022 showing mild gaseous distention of small bowel in the lower abdomen up to 3.5 cm suggesting possible ileus.  Her diet has been advanced to a mechanical soft.  She has had some elevated LFTs felt to be possibly related to amiodarone.  Hepatitis panel negative.  Urinalysis study 10/02/2022 with negative nitrite moderate leukocytosis with preliminary culture 80,000 gram-positive cocci and 30,000 gram-negative rod currently maintained on Omnicef transition to amoxicillin.  Therapy evaluations completed due to patient decreased functional mobility was admitted for a comprehensive rehab program.   Review of Systems  Constitutional:  Positive for malaise/fatigue. Negative for chills and fever.  HENT:  Negative for hearing loss.   Eyes:  Negative for double vision.  Respiratory:  Positive for shortness of breath. Negative for cough and wheezing.   Cardiovascular:  Positive for palpitations and leg swelling. Negative for chest pain.  Gastrointestinal:  Positive for abdominal pain, constipation and nausea. Negative for heartburn and vomiting.  Genitourinary:  Negative for dysuria, flank pain and hematuria.  Musculoskeletal:  Positive for back pain, joint pain and myalgias.  Skin:  Negative for rash.  Neurological:  Positive for tremors and seizures.  Psychiatric/Behavioral:  Anxiety  All other systems reviewed and are  negative.       Past Medical History:  Diagnosis Date   Anxiety     Diabetes (HCC) 10/27/2013   DVT (deep venous thrombosis) (HCC)     Essential tremor 10/27/2013   High cholesterol 1999   Hypertension     Ischemic stroke (HCC) 07/18/2014   Obesity, unspecified 10/27/2013   Pneumonia     Unspecified hereditary and idiopathic peripheral neuropathy 10/27/2013         Past Surgical History:  Procedure Laterality Date   BIOPSY   09/26/2022    Procedure: BIOPSY;  Surgeon: Lynann Bologna, MD;  Location: [facility] ENDOSCOPY;  Service: Gastroenterology;;   CATARACT EXTRACTION Bilateral     ESOPHAGOGASTRODUODENOSCOPY (EGD) WITH PROPOFOL N/A 09/26/2022    Procedure: ESOPHAGOGASTRODUODENOSCOPY (EGD) WITH PROPOFOL;  Surgeon: Lynann Bologna, MD;  Location: [facility] ENDOSCOPY;  Service: Gastroenterology;  Laterality: N/A;   FOOT SURGERY       IR KYPHO LUMBAR INC FX REDUCE BONE BX UNI/BIL CANNULATION INC/IMAGING   06/15/2019         Family History  Problem Relation Age of Onset   Stroke Mother     Heart Problems Sister     Heart Problems Brother     Coronary artery disease Other     Tremor Neg Hx      Social History:  reports that she has never smoked. She has never used smokeless tobacco. She reports that she does not drink alcohol and does not use drugs. Allergies:       Allergies  Allergen Reactions   Tape Rash      Please do not use Plastic Tape          Medications Prior to Admission  Medication Sig Dispense Refill   acetaminophen (TYLENOL) 325 MG tablet Take 2 tablets (650 mg total) by mouth every 6 (six) hours as needed. (Patient taking differently: Take 325 mg by mouth every 6 (six) hours as needed for moderate pain.) 30 tablet 0   ALPRAZolam (XANAX) 0.25 MG tablet Take 0.25 mg by mouth 2 (two) times daily as needed for anxiety.        amLODipine (NORVASC) 2.5 MG tablet Take 2.5 mg by mouth daily.   0   atorvastatin (LIPITOR) 20 MG tablet TAKE 1 TABLET BY MOUTH EVERY DAY IN THE EVENING (Patient  taking differently: Take 20 mg by mouth every evening.) 90 tablet 3   CALCIUM PO Take 1 tablet by mouth daily.       Cholecalciferol (VITAMIN D PO) Take 1 tablet by mouth daily.       clopidogrel (PLAVIX) 75 MG tablet TAKE 1 TABLET BY MOUTH EVERY DAY (Patient taking differently: Take 75 mg by mouth once.) 90 tablet 1   diclofenac Sodium (VOLTAREN) 1 % GEL Apply 2 g topically 4 (four) times daily as needed. 350 g 2   DULoxetine (CYMBALTA) 20 MG capsule TAKE 1 CAPSULE BY MOUTH EVERY DAY (Patient taking differently: Take 20 mg by mouth daily.) 90 capsule 2   furosemide (LASIX) 20 MG tablet Take 20 mg by mouth daily.   3   gabapentin (NEURONTIN) 100 MG capsule Take 1 capsule (100 mg total) by mouth at bedtime. (Patient taking differently: Take 100 mg by mouth in the morning and at bedtime.) 90 capsule 3   isosorbide mononitrate (IMDUR) 30 MG 24 hr tablet Take 15 mg by mouth daily.       levETIRAcetam (KEPPRA) 500 MG tablet  Take 1 tablet (500 mg total) by mouth 2 (two) times daily. 180 tablet 3   lidocaine (LIDODERM) 5 % Place 1 patch onto the skin daily. Remove & Discard patch within 12 hours or as directed by MD 30 patch 11   PAZEO 0.7 % SOLN Place 1 drop into both eyes at bedtime.        primidone (MYSOLINE) 250 MG tablet Take 1 tablet (250 mg total) by mouth 2 (two) times daily. 180 tablet 3   RESTASIS 0.05 % ophthalmic emulsion Place 1 drop into both eyes 2 (two) times daily.    4   ACCU-CHEK AVIVA PLUS test strip USE AS INSTRUCTED TO TEST ONCE A DAY DX CODE:E11.9 50 strip 25   Accu-Chek Softclix Lancets lancets Use as instructed 100 each 12          Home: Home Living Family/patient expects to be discharged to:: Private residence Living Arrangements:  (lives with son) Available Help at Discharge: Available 24 hours/day (son will arrange 24/7 supervision when he is at work twice weekly) Type of Home: House Home Access: Level entry Home Layout: One level Bathroom Shower/Tub: Clinical cytogeneticist: Standard Bathroom Accessibility: Yes Home Equipment: Conservation officer, nature (2 wheels), Other (comment) (O2) Additional Comments: Pt's son present and interpreting. Pt on supplamental O2 at home intermittently (only with SOB)  Lives With: Son   Functional History: Prior Function Prior Level of Function : Independent/Modified Independent (was Mod I until few weeks pta) Mobility Comments: RW for mobility, working with OP PT ADLs Comments: aide assisting 1x/wk for bathing   Functional Status:  Mobility: Bed Mobility Overal bed mobility: Needs Assistance Bed Mobility: Supine to Sit Rolling: Min assist Sidelying to sit: Mod assist Supine to sit: Mod assist, +2 for physical assistance, Min assist Sit to supine: +2 for physical assistance, Mod assist General bed mobility comments: min-mod+2 supine <> sit with trunk assist and utilizing pad to get hips closer to EOB; pt with great insight on how to advance mobility by beginning to initiate but ultimately requires assist for successful movement Transfers Overall transfer level: Needs assistance Equipment used: Rolling walker (2 wheels) Transfers: Sit to/from Stand Sit to Stand: Mod assist, +2 physical assistance Bed to/from chair/wheelchair/BSC transfer type:: Via Lift equipment Squat pivot transfers: Mod assist Step pivot transfers: Mod assist, +2 physical assistance, +2 safety/equipment Transfer via Lift Equipment: Stedy General transfer comment: modA+2 to stand, pt driven to stand up and did not allow for therapist to correct UE placement, will continue to address appropriate sequence for successful transfers Ambulation/Gait General Gait Details: Deferred Pre-gait activities: lateral stepping to L along side of bed with verbal and tactile cues for RW negotiation and sequencing of LEs, modA+2 throughout   ADL: ADL Overall ADL's : Needs assistance/impaired Eating/Feeding: Minimal assistance, Sitting Eating/Feeding  Details (indicate cue type and reason): tremor interferring with ability to self feed - may benefit fomr tubing and lidded cup Grooming: Wash/dry face, Set up, Sitting Grooming Details (indicate cue type and reason): pt able to wash face at EOB, but would required increased assist for bimanual tasks. assist required for balance if unsupported. Upper Body Bathing: Moderate assistance Lower Body Bathing: Maximal assistance Upper Body Dressing : Moderate assistance, Sitting Lower Body Dressing: Total assistance, +2 for physical assistance, +2 for safety/equipment, Sit to/from stand Toilet Transfer: Total assistance, +2 for physical assistance, +2 for safety/equipment Toilet Transfer Details (indicate cue type and reason): using stedy to recliner Toileting- Clothing Manipulation and Hygiene: Total assistance, +  2 for safety/equipment, Sit to/from stand Functional mobility during ADLs: Moderate assistance, +2 for physical assistance, +2 for safety/equipment General ADL Comments: limited to EOB only   Cognition: Cognition Overall Cognitive Status: Difficult to assess Orientation Level: Oriented X4 Cognition Arousal/Alertness: Awake/alert Behavior During Therapy: WFL for tasks assessed/performed Overall Cognitive Status: Difficult to assess Area of Impairment: Following commands Orientation Level: Disoriented to, Time Following Commands: Follows one step commands with increased time Problem Solving: Slow processing, Requires verbal cues, Requires tactile cues General Comments: family assist for translation but pt knows some conversational english, follows commands with increased time Difficult to assess due to: Non-English speaking   Physical Exam: Blood pressure (!) 141/45, pulse 60, temperature 98.5 F (36.9 C), temperature source Oral, resp. rate 13, height 5' (1.524 m), weight 77.1 kg, SpO2 99 %. Physical Exam  Constitutional:  Patient is alert. No acute distress. Appropriate appearance  for age.  HENT: No JVD. Neck Supple. Trachea midline. Atraumatic, normocephalic. Eyes: PERRLA. EOMI. Visual fields grossly intact.  Cardiovascular: RRR, +murmur. No Edema. Peripheral pulses 2+  Respiratory: Bilateral decreased breath sounds in lower lobes. No rales, rhonchi, or wheezing. On RA.  Abdomen: + bowel sounds, normoactive. No distention or tenderness.  GU: Not examined. +urinary incontinent episode .  Skin: C/D/I. Scattered UE bruising. No apparent lesions. MSK:      No apparent deformity.      Strength:                RUE: 4/5 SA, 4/5 EF, 5/5 EE, 4/5 WE, 4/5 FF, 4/5 FA                 LUE: 4/5 SA, 4/5 EF, 5/5 EE, 5/5 WE, 5/5 FF, 5/5 FA                 RLE: 4/5 HF, 5/5 KE, 5/5 DF, 5/5 EHL, 5/5 PF                 LLE:  4/5 HF, 5/5 KE, 5/5 DF, 5/5 EHL, 5/5 PF   Neurologic exam:  Cognition: AAO to person, place, time (not month, but intact to day and year).  Language: Difficult to assess d/t language barrier. No dysarthria. Names 3/3 objects correctly.  Memory: Recalls 2/3 objects at 5 minutes.  Insight: Good insight into current condition.  Mood: Pleasant affect, appropriate mood.  Sensation: To light touch intact in BL UEs and LEs  Reflexes: 2+ in BL UE and LEs. Negative Hoffman's and babinski signs bilaterally.  CN: 2-12 grossly intact.  Coordination: Severe tremor effecting neck and bilateral upper extremities; present at rest but worse with intention, causing difficulty with BL UE fine motor and ataxia testing.  Spasticity: MAS 0 in all extremities.         Lab Results Last 48 Hours         Results for orders placed or performed during the hospital encounter of 09/16/22 (from the past 48 hour(s))  Urinalysis, Routine w reflex microscopic Urine, Unspecified Source     Status: Abnormal    Collection Time: 10/02/22  2:01 PM  Result Value Ref Range    Color, Urine YELLOW YELLOW    APPearance CLOUDY (A) CLEAR    Specific Gravity, Urine 1.005 1.005 - 1.030    pH 7.0 5.0  - 8.0    Glucose, UA NEGATIVE NEGATIVE mg/dL    Hgb urine dipstick LARGE (A) NEGATIVE    Bilirubin Urine NEGATIVE NEGATIVE    Ketones, ur NEGATIVE  NEGATIVE mg/dL    Protein, ur NEGATIVE NEGATIVE mg/dL    Nitrite NEGATIVE NEGATIVE    Leukocytes,Ua MODERATE (A) NEGATIVE    RBC / HPF 11-20 0 - 5 RBC/hpf    WBC, UA 11-20 0 - 5 WBC/hpf    Bacteria, UA MANY (A) NONE SEEN    Squamous Epithelial / LPF 0-5 0 - 5      Comment: Performed at [facility], [place] [address], [place], [place] [address]  Urine Culture     Status: Abnormal    Collection Time: 10/02/22  7:35 PM    Specimen: Urine, Catheterized  Result Value Ref Range    Specimen Description URINE, CATHETERIZED      Special Requests          NONE Performed at [facility] [address] [place], [place] [address]      Culture (A)        80,000 COLONIES/mL ENTEROCOCCUS FAECALIS 30,000 COLONIES/mL ESCHERICHIA COLI      Report Status 10/04/2022 FINAL      Organism ID, Bacteria ENTEROCOCCUS FAECALIS (A)      Organism ID, Bacteria ESCHERICHIA COLI (A)        Susceptibility    Escherichia coli - MIC*      AMPICILLIN 8 SENSITIVE Sensitive        CEFAZOLIN <=4 SENSITIVE Sensitive        CEFEPIME <=0.12 SENSITIVE Sensitive        CEFTRIAXONE <=0.25 SENSITIVE Sensitive        CIPROFLOXACIN <=0.25 SENSITIVE Sensitive        GENTAMICIN <=1 SENSITIVE Sensitive        IMIPENEM <=0.25 SENSITIVE Sensitive        NITROFURANTOIN <=16 SENSITIVE Sensitive        TRIMETH/SULFA <=20 SENSITIVE Sensitive        AMPICILLIN/SULBACTAM <=2 SENSITIVE Sensitive        PIP/TAZO <=4 SENSITIVE Sensitive       * 30,000 COLONIES/mL ESCHERICHIA COLI    Enterococcus faecalis - MIC*      AMPICILLIN <=2 SENSITIVE Sensitive        NITROFURANTOIN <=16 SENSITIVE Sensitive        VANCOMYCIN 1 SENSITIVE Sensitive       * 80,000 COLONIES/mL ENTEROCOCCUS FAECALIS  Comprehensive metabolic panel     Status: Abnormal    Collection Time: 10/03/22  1:35 AM   Result Value Ref Range    Sodium 135 135 - 145 mmol/L    Potassium 4.3 3.5 - 5.1 mmol/L    Chloride 91 (L) 98 - 111 mmol/L    CO2 35 (H) 22 - 32 mmol/L    Glucose, Bld 132 (H) 70 - 99 mg/dL      Comment: Glucose reference range applies only to samples taken after fasting for at least 8 hours.    BUN 17 8 - 23 mg/dL    Creatinine, Ser 0.75 0.44 - 1.00 mg/dL    Calcium 8.6 (L) 8.9 - 10.3 mg/dL    Total Protein 5.8 (L) 6.5 - 8.1 g/dL    Albumin 2.7 (L) 3.5 - 5.0 g/dL    AST 101 (H) 15 - 41 U/L    ALT 160 (H) 0 - 44 U/L    Alkaline Phosphatase 88 38 - 126 U/L    Total Bilirubin 0.4 0.3 - 1.2 mg/dL    GFR, Estimated >60 >60 mL/min      Comment: (NOTE) Calculated using the CKD-EPI  Creatinine Equation (2021)      Anion gap 9 5 - 15      Comment: Performed at [facility], [place] [address], [place], [place] [address]  Comprehensive metabolic panel     Status: Abnormal    Collection Time: 10/04/22  6:22 AM  Result Value Ref Range    Sodium 131 (L) 135 - 145 mmol/L    Potassium 4.1 3.5 - 5.1 mmol/L    Chloride 91 (L) 98 - 111 mmol/L    CO2 32 22 - 32 mmol/L    Glucose, Bld 131 (H) 70 - 99 mg/dL      Comment: Glucose reference range applies only to samples taken after fasting for at least 8 hours.    BUN 17 8 - 23 mg/dL    Creatinine, Ser 0.75 0.44 - 1.00 mg/dL    Calcium 8.3 (L) 8.9 - 10.3 mg/dL    Total Protein 5.9 (L) 6.5 - 8.1 g/dL    Albumin 2.7 (L) 3.5 - 5.0 g/dL    AST 93 (H) 15 - 41 U/L    ALT 146 (H) 0 - 44 U/L    Alkaline Phosphatase 101 38 - 126 U/L    Total Bilirubin 0.6 0.3 - 1.2 mg/dL    GFR, Estimated >60 >60 mL/min      Comment: (NOTE) Calculated using the CKD-EPI Creatinine Equation (2021)      Anion gap 8 5 - 15      Comment: Performed at [facility], [place] [address], [place], [place] [address]  Heparin level (unfractionated)     Status: Abnormal    Collection Time: 10/04/22  6:22 AM  Result Value Ref Range    Heparin Unfractionated >1.10 (H) 0.30 -  0.70 IU/mL      Comment: (NOTE) The clinical reportable range upper limit is being lowered to >1.10 to align with the FDA approved guidance for the current laboratory assay.   If heparin results are below expected values, and patient dosage has  been confirmed, suggest follow up testing of antithrombin III levels. Performed at [facility], [place] [address], [place], [place] [address]    CBC     Status: Abnormal    Collection Time: 10/04/22  6:22 AM  Result Value Ref Range    WBC 7.3 4.0 - 10.5 K/uL    RBC 3.00 (L) 3.87 - 5.11 MIL/uL    Hemoglobin 9.2 (L) 12.0 - 15.0 g/dL    HCT 28.2 (L) 36.0 - 46.0 %    MCV 94.0 80.0 - 100.0 fL    MCH 30.7 26.0 - 34.0 pg    MCHC 32.6 30.0 - 36.0 g/dL    RDW 14.6 11.5 - 15.5 %    Platelets 170 150 - 400 K/uL      Comment: REPEATED TO VERIFY    nRBC 0.0 0.0 - 0.2 %      Comment: Performed at [facility], [place] [address], [place], [place] [address]  Heparin level (unfractionated)     Status: Abnormal    Collection Time: 10/04/22  8:37 AM  Result Value Ref Range    Heparin Unfractionated >1.10 (H) 0.30 - 0.70 IU/mL      Comment: (NOTE) The clinical reportable range upper limit is being lowered to >1.10 to align with the FDA approved guidance for the current laboratory assay.   If heparin results are below expected values, and patient dosage has  been confirmed, suggest follow  up testing of antithrombin III levels. Performed at [facility], [place] [address], [place], [place] [address]         Imaging Results (Last 48 hours)  CT Angio Chest Pulmonary Embolism (PE) W or WO Contrast   Result Date: 10/03/2022 CLINICAL DATA:  DVT and prolonged immobilization. EXAM: CT ANGIOGRAPHY CHEST WITH CONTRAST TECHNIQUE: Multidetector CT imaging of the chest was performed using the standard protocol during bolus administration of intravenous contrast. Multiplanar CT image reconstructions and MIPs were obtained to evaluate the vascular  anatomy. RADIATION DOSE REDUCTION: This exam was performed according to the departmental dose-optimization program which includes automated exposure control, adjustment of the mA and/or kV according to patient size and/or use of iterative reconstruction technique. CONTRAST:  80mL OMNIPAQUE IOHEXOL 350 MG/ML SOLN COMPARISON:  Chest radiograph 09/28/2022 FINDINGS: Despite efforts by the technologist and patient, motion artifact is present on today's exam and could not be eliminated. This reduces exam sensitivity and specificity. Cardiovascular: There is substantial primarily chronic pulmonary embolus with notable peripheral thrombus including in the right and left pulmonary arteries. Scattered areas of likely acute superimposed pulmonary embolus, for example in the left lower lobe on image 231 series 7 although also in the bilateral upper lobes. Right ventricular to left ventricular ratio is elevated at 1.3. Intervertebral calor septal thickness of 1.8 cm compatible with left ventricular hypertrophy. Mild mitral and aortic valve calcification. Coronary, aortic arch, and branch vessel atherosclerotic vascular disease. Mediastinum/Nodes: Probable wall thickening in the distal half of the thoracic esophagus. Reflux would be a common cause. Lungs/Pleura: Moderate right trace left pleural effusion with passive atelectasis. Airway thickening and airway plugging in the left lower lobe. Nonspecific mild peripheral airspace opacity anteriorly in the left upper lobe measuring 1.4 by 0.9 cm on image 69 series 6. Upper Abdomen: Abdominal aortic atherosclerosis. There is atheromatous calcification proximally in the superior mesenteric artery. Musculoskeletal: L1 compression fracture as shown on 09/30/2022. Thoracic spondylosis with multilevel bridging spurring. Review of the MIP images confirms the above findings. IMPRESSION: 1. Acute on chronic pulmonary embolus with the bulk of the filling defect in the pulmonary arterial tree  peripheral compatible with chronic embolus. Scattered areas of likely acute superimposed pulmonary embolus in the bilateral upper lobes and left lower lobe. Positive for acute PE with CT evidence of right heart strain (RV/LV Ratio = 1.3 ) consistent with at least submassive (intermediate risk) PE. The presence of right heart strain has been associated with an increased risk of morbidity and mortality. Please refer to the "Code PE Focused" order set in EPIC. 2. Moderate right and trace left pleural effusion with passive atelectasis. 3. Airway thickening and airway plugging in the left lower lobe. 4. There is some focal consolidation anteriorly in the left upper lobe which is nonspecific. However this could be from a small pulmonary infarct. 5. Left ventricular hypertrophy and cardiomegaly. 6. Coronary and aortic atherosclerosis along with atherosclerosis in the proximal superior mesenteric artery. Mild mitral and aortic valve calcification. 7. Chronic L1 compression fracture. Aortic Atherosclerosis (ICD10-I70.0). Critical Value/emergent results were called by telephone at the time of interpretation on 10/03/2022 at 7:06 pm to provider Dr. Sherrye Payor, who verbally acknowledged these results. Electronically Signed   By: Van Clines M.D.   On: 10/03/2022 19:06    VAS [place] LOWER EXTREMITY VENOUS (DVT)   Result Date: 10/03/2022  Lower Venous DVT Study Patient Name:  [name] [name]  Date of Exam:   10/02/2022 Medical Rec #: [id number]  Accession #:    [id number] Date of Birth: 1930-04-19        Patient Gender: F Patient Age:   75 years Exam Location:  [facility] Procedure:      VAS [place] LOWER EXTREMITY VENOUS (DVT) Referring Phys: Marye Round MCINTYRE --------------------------------------------------------------------------------  Indications: Pain in right leg.  Comparison Study: 09-22-2022 Prior bilateral lower extremity venous was negative                   for DVT. Performing Technologist: Darlin Coco  RDMS, RVT  Examination Guidelines: A complete evaluation includes B-mode imaging, spectral Doppler, color Doppler, and power Doppler as needed of all accessible portions of each vessel. Bilateral testing is considered an integral part of a complete examination. Limited examinations for reoccurring indications may be performed as noted. The reflux portion of the exam is performed with the patient in reverse Trendelenburg.  +---------+---------------+---------+-----------+----------+-----------------+ RIGHT    CompressibilityPhasicitySpontaneityPropertiesThrombus Aging    +---------+---------------+---------+-----------+----------+-----------------+ CFV      Full           Yes      Yes                                    +---------+---------------+---------+-----------+----------+-----------------+ SFJ      Full                                                           +---------+---------------+---------+-----------+----------+-----------------+ FV Prox  Full                                                           +---------+---------------+---------+-----------+----------+-----------------+ FV Mid   Full                                                           +---------+---------------+---------+-----------+----------+-----------------+ FV DistalFull                                                           +---------+---------------+---------+-----------+----------+-----------------+ PFV      Full                                                           +---------+---------------+---------+-----------+----------+-----------------+ POP      Full           Yes      Yes                                    +---------+---------------+---------+-----------+----------+-----------------+  PTV      Partial        Yes      Yes                  Age Indeterminate +---------+---------------+---------+-----------+----------+-----------------+ PERO     Full                                                            +---------+---------------+---------+-----------+----------+-----------------+   +----+---------------+---------+-----------+----------+--------------+ LEFTCompressibilityPhasicitySpontaneityPropertiesThrombus Aging +----+---------------+---------+-----------+----------+--------------+ CFV Full           Yes      Yes                                 +----+---------------+---------+-----------+----------+--------------+     Summary: RIGHT: - Findings consistent with age indeterminate deep vein thrombosis involving the right posterior tibial veins. - No cystic structure found in the popliteal fossa.  LEFT: - No evidence of common femoral vein obstruction.  *See table(s) above for measurements and observations. Electronically signed by Orlie Pollen on 10/03/2022 at 2:35:33 PM.    Final            Blood pressure (!) 141/45, pulse 60, temperature 98.5 F (36.9 C), temperature source Oral, resp. rate 13, height 5' (1.524 m), weight 77.1 kg, SpO2 99 %.   Medical Problem List and Plan: 1. Functional deficits secondary to debility/multi medical including left thalamic posterior limb infarction 2015 receiving inpatient rehab services 07/21/2014 - 07/30/2014             -patient may shower             -ELOS/Goals: 10-14 days,  2.  Antithrombotics: -DVT/anticoagulation: Venous Doppler study 10/02/2022 findings consistent with age-indeterminate DVT right posterior tibial vein as well as CT angiogram of the chest showing acute on chronic pulmonary emboli.   - Intravenous heparin initiated 10/03/2022; plan to transition to Eliquis - held off 11/10 d/t ?supratherapeutic heparin gtt - follow up with pharmacy regarding appropriate timing of transition - Per documentation will need serial [place] for RLE DVT on 11/13 and 11/22              -antiplatelet therapy: N/A  3. Pain Management: Neurontin 100 mg nightly, Cymbalta 20 mg daily, Voltaren gel as  needed 4. Mood/Behavior/Sleep: Provide emotional support             -antipsychotic agents: N/A 5. Neuropsych/cognition: This patient is capable of making decisions on her own behalf. 6. Skin/Wound Care: Routine skin checks 7. Fluids/Electrolytes/Nutrition: Routine in and outs with follow-up chemistries 8.  GI bleed.  Status post upper GI endoscopy 09/26/2022.  Continue PPI twice daily.              - Will need to discuss long-term AC risks and benefits with patient and family prior to discharge.  9.  Acute respiratory failure requiring noninvasive imaging treated with aggressive diuresis.  Check oxygen saturations every shift. Wean oxygen as tolerated.  10.  Acute on chronic diastolic congestive heart failure.  Demadex 20 mg daily.  Monitor for any signs of fluid overload.  Daily weights.               - Patient was using supplemental oxygen as needed prior to admission.  11.  New onset atrial fibrillation with RVR.  Amiodarone 200 mg daily.  Follow-up cardiology service Dr. Doylene Canard.  Status post DCCV 09/24/2022.  Continue Cardizem 30 mg every 8 hours, Lanoxin 0.0625 mg daily, Toprol 50 mg daily 12.  Elevated LFTs.  Felt to be possibly due to amiodarone.  Hepatitis panel negative follow-up labs. 13.  Seizure disorder.  Longstanding Keppra 500 mg twice daily 14.  Hyperlipidemia.  Lipitor 15.  Ileus.  Resolving.  Diet advanced to mechanical soft. LBM <24 hours per son.  16.  Obesity.  BMI 33.20.  Dietary follow-up 17.  History of type 2 diabetes mellitus, latest hemoglobin A1c 5.4.  18.  UTI.  80,000 gram-positive cocci/30,000 gram-negative rod.  Initially on Omnicef transition to amoxicillin.  19. Hx essential tremor, severe. On longstanding primidone 250 mg BID, low-dose gabapentin, cymbalta, keppra.       - Per son, can perform ADLs including self feeding/grooming at home.       - Follows closely with OP neurology  Cathlyn Parsons, PA-C 10/04/2022  I have examined the patient  independently and edited the note for HPI, ROS, exam, assessment, and plan as appropriate. I am in agreement with the above recommendations.   [name], DO 10/04/2022

## 2022-10-04 NOTE — Plan of Care (Addendum)
1:50pm  Called patient's granddaughter Gae Dry to update her on patient's treatment plan.  Informed Denna that patient is being tranfered to CIR today and IV heparin which was started today for PE was discontinued and switched to Eliquis 5 mg twice daily.  [name] expressed concerns about the change in medications due to concerns from recent GI bleed.  The patient had a recent GI bleed about 2 weeks ago and at that time GI had recommended no anticoagulation due to large gastric ulcer. I informed her the change was made after we got the ok from Dr. Chales Abrahams (GI attending) to start Eliquis  due to starting therapy with CIR soon and being on a drip could impede her therapy.  Informed daughter that we did not start her on the usual loading dose treatment for PE which is usually 10 mg twice daily of Eliquis due to consideration of her recent GI bleed.  [name] still expressed concerns on patient start Eliquis and wants to hold off until she speak to GI.  2:15 (follow up call) Called back patient's granddaughter Gae Dry) to give update after speaking to CIR. Informed her CIR confirmed they could take patient on heparin drip. [name] was agreeable to the plan to restart heparin drip. Informed her I discontinued the DOAC and we will be starting the Heparin drip which will be managed by the provider in CIR.  Jerre Simon, MD PGY-2, [facility] Family [facility] Resident  Please page [phone] with questions.

## 2022-10-04 NOTE — Progress Notes (Deleted)
ANTICOAGULATION CONSULT NOTE  Pharmacy Consult for heparin Indication: pulmonary embolus and DVT  Allergies  Allergen Reactions   Tape Rash    Please do not use Plastic Tape    Patient Measurements:    Heparin Dosing Weight: 63 kg  Vital Signs: Temp: 97.9 F (36.6 C) (11/10 1508) Temp Source: Oral (11/10 1402) BP: 151/49 (11/10 1508) Pulse Rate: 68 (11/10 1508)  Labs: Recent Labs    10/02/22 0148 10/03/22 0135 10/04/22 0622 10/04/22 0837  HGB  --   --  9.2*  --   HCT  --   --  28.2*  --   PLT  --   --  170  --   HEPARINUNFRC  --   --  >1.10* >1.10*  CREATININE 0.66 0.75 0.75  --     Estimated Creatinine Clearance: 43.7 mL/min (by C-G formula based on SCr of 0.75 mg/dL).  Medical History: Past Medical History:  Diagnosis Date   Anxiety    Diabetes (HCC) 10/27/2013   DVT (deep venous thrombosis) (HCC)    Essential tremor 10/27/2013   High cholesterol 1999   Hypertension    Ischemic stroke (HCC) 07/18/2014   Obesity, unspecified 10/27/2013   Pneumonia    Unspecified hereditary and idiopathic peripheral neuropathy 10/27/2013    Assessment: 89 yof admitted with new onset Afib and volume overload. CTA chest found acute on chronic PE. LE doppler also shows right DVT. Hospital course complicated by previous GIB. Family in agreement to begin IV heparin. Pharmacy consulted to manage inpatient.   Hgb stable 9s, platelets are normal. No issues with heparin infusion or signs of bleeding reported. 11/10 heparin level > 1.1, supratherapeutic. Clarified with RN and phlebotomy, unable to confirm if lab is erroneous. Repeat heparin level in 2 hours still significantly elevated. Holding heparin infusion for 1 hour (held at 10AM).   Per discussion with FM team, discussing [facility] plan with family before transitioning to apixaban. Will forego the loading dose due to recent bleed. Holding apixaban for now until family able to talk with GI  Goal of Therapy:  Heparin level 0.3-0.5  units/ml d/t recent GIB Monitor platelets by anticoagulation protocol: Yes   Plan:  Resume heparin at 850 unit /hr Heparin level in 8 hours Daily heparin level, CBC  Thank you Okey Regal, PharmD

## 2022-10-04 NOTE — Progress Notes (Signed)
Occupational Therapy Treatment Patient Details Name: [name] [name] MRN: [id number] DOB: 05/02/1930 Today's Date: 10/04/2022   History of present illness Pt is an 88 y.o. female admitted 09/16/22 with worsening BLE weakness, SOB. Pt with new onset AFib with RVR as well as new CHF and GIB. Bilateral pleural effusions R>L found 10/29. S/P TEE/DCCV 10/31. Tx to ICU due to GIB. CTA chest found acute on chronic PE. LE doppler also shows right DVT. PMH includes HTN, DVT, DM, essential tremor, stroke (2015), anxiety.   OT comments  Pt has made significant progress as compared to initial eval in ICU. Appropriate for AIR however will need sessions spread out throughout the day due to poor endurance. VSS on RA with SpO2 briefly dropped to 88 with quick rebound to 90s. Good use of pursed lip breathing. Excellent participation and very supportive family.    Recommendations for follow up therapy are one component of a multi-disciplinary discharge planning process, led by the attending physician.  Recommendations may be updated based on patient status, additional functional criteria and insurance authorization.    Follow Up Recommendations  Acute inpatient rehab (3hours/day)    Assistance Recommended at Discharge Frequent or constant Supervision/Assistance  Patient can return home with the following  Assistance with cooking/housework;Direct supervision/assist for financial management;Assistance with feeding;Direct supervision/assist for medications management;Assist for transportation;Help with stairs or ramp for entrance;Two people to help with walking and/or transfers;A lot of help with bathing/dressing/bathroom   Equipment Recommendations  Other (comment) (defer)    Recommendations for Other Services      Precautions / Restrictions Precautions Precautions: Fall;Other (comment) Precaution Comments: Watch O2 Restrictions Weight Bearing Restrictions: No       Mobility Bed Mobility Overal bed  mobility: Needs Assistance Bed Mobility: Supine to Sit Rolling: Min assist Sidelying to sit: Mod assist Supine to sit: Mod assist, +2 for physical assistance, Min assist          Transfers Overall transfer level: Needs assistance Equipment used: Rolling walker (2 wheels) Transfers: Sit to/from Stand Sit to Stand: +2 physical assistance, Min assist   Squat pivot transfers: Mod assist Step pivot transfers: Mod assist, +2 physical assistance, +2 safety/equipment           Balance Overall balance assessment: Needs assistance Sitting-balance support: Feet supported, No upper extremity supported Sitting balance-Leahy Scale: Fair Sitting balance - Comments: fluctuating from min guard-modA sitting EOB Postural control: Posterior lean Standing balance support: Bilateral upper extremity supported, During functional activity Standing balance-Leahy Scale: Poor Standing balance comment: reliant on UEs for upright positioning, did not attempt single hand support                           ADL either performed or assessed with clinical judgement   ADL Overall ADL's : Needs assistance/impaired Eating/Feeding: Minimal assistance;Sitting Eating/Feeding Details (indicate cue type and reason): tremor interferring with ability to self feed - may benefit fomr tubing and lidded cup Grooming: Wash/dry face;Set up;Sitting Grooming Details (indicate cue type and reason): pt able to wash face at EOB, but would required increased assist for bimanual tasks. assist required for balance if unsupported. Upper Body Bathing: Moderate assistance   Lower Body Bathing: Maximal assistance   Upper Body Dressing : Moderate assistance;Sitting   Lower Body Dressing: Total assistance;+2 for physical assistance;+2 for safety/equipment;Sit to/from stand   Toilet Transfer: +2 for physical assistance;+2 for safety/equipment;Rolling walker (2 wheels);Moderate assistance (simulated) Toilet Transfer Details  (indicate cue type and reason):  using stedy to recliner Toileting- Clothing Manipulation and Hygiene: +2 for safety/equipment;Sit to/from stand;Maximal assistance       Functional mobility during ADLs: Moderate assistance;+2 for physical assistance;Rolling walker (2 wheels) General ADL Comments: limited by poor endurance; easily fatigues    Extremity/Trunk Assessment              Vision   Vision Assessment?: No apparent visual deficits   Perception     Praxis      Cognition Arousal/Alertness: Awake/alert Behavior During Therapy: WFL for tasks assessed/performed Overall Cognitive Status: Within Functional Limits for tasks assessed                                 General Comments: per son she is at her baseline        Exercises Exercises: General Lower Extremity General Exercises - Upper Extremity Shoulder Flexion: AROM, 10 reps, Both, Supine Elbow Flexion: AROM, Both, 10 reps Elbow Extension: AROM, Both, 10 reps, Supine General Exercises - Lower Extremity Ankle Circles/Pumps: Both, 10 reps, Seated Long Arc Quad: Both, 10 reps, Seated Hip Flexion/Marching: Both, 10 reps, Seated    Shoulder Instructions       General Comments VSS on RA    Pertinent Vitals/ Pain       Pain Assessment Pain Assessment: Faces Faces Pain Scale: Hurts a little bit Pain Location: generalized Pain Descriptors / Indicators: Aching, Tiring Pain Intervention(s): Limited activity within patient's tolerance  Home Living                                          Prior Functioning/Environment              Frequency  Min 2X/week        Progress Toward Goals  OT Goals(current goals can now be found in the care plan section)  Progress towards OT goals: Progressing toward goals  Acute Rehab OT Goals Patient Stated Goal: to get stronger OT Goal Formulation: With patient/family Time For Goal Achievement: 10/09/22 Potential to Achieve Goals:  Good ADL Goals Pt Will Perform Grooming: with min guard assist;sitting Pt Will Perform Upper Body Dressing: with min assist;sitting Pt Will Perform Lower Body Dressing: with max assist;sitting/lateral leans;with adaptive equipment Pt Will Transfer to Toilet: with max assist;with +2 assist;bedside commode Additional ADL Goal #1: Pt will complete bed mobility with min assist and maintain sitting balance dynamically with min assist for 5 minutes as precursor to ADLs.  Plan Frequency remains appropriate;Discharge plan needs to be updated    Co-evaluation                 AM-PAC OT "6 Clicks" Daily Activity     Outcome Measure   Help from another person eating meals?: A Little Help from another person taking care of personal grooming?: A Little Help from another person toileting, which includes using toliet, bedpan, or urinal?: A Lot Help from another person bathing (including washing, rinsing, drying)?: A Lot Help from another person to put on and taking off regular upper body clothing?: A Lot Help from another person to put on and taking off regular lower body clothing?: Total 6 Click Score: 13    End of Session    OT Visit Diagnosis: Unsteadiness on feet (R26.81);Other abnormalities of gait and mobility (R26.89);Muscle weakness (generalized) (M62.81);Pain Pain - part  of body:  (generalized)   Activity Tolerance Patient tolerated treatment well   Patient Left in chair;with call bell/phone within reach;with family/visitor present   Nurse Communication Mobility status        Time: 1035-1101 OT Time Calculation (min): 26 min  Charges: OT General Charges $OT Visit: 1 Visit OT Treatments $Self Care/Home Management : 23-37 mins  Luisa Dago, OT/L   Acute OT Clinical Specialist Acute Rehabilitation Services Pager [phone] Office [phone]   [facility] 10/04/2022, 11:15 AM

## 2022-10-04 NOTE — Progress Notes (Addendum)
ANTICOAGULATION CONSULT NOTE  Pharmacy Consult for heparin Indication: pulmonary embolus and DVT  Allergies  Allergen Reactions   Tape Rash    Please do not use Plastic Tape    Patient Measurements: Height: 5' (152.4 cm) Weight: 77.1 kg (169 lb 15.6 oz) IBW/kg (Calculated) : 45.5  Heparin Dosing Weight: 63 kg  Vital Signs: Temp: 98.5 F (36.9 C) (11/10 0408) Temp Source: Oral (11/10 0408) BP: 141/45 (11/10 0408) Pulse Rate: 60 (11/10 0408)  Labs: Recent Labs    10/02/22 0148 10/03/22 0135 10/04/22 0622  HGB  --   --  9.2*  HCT  --   --  28.2*  PLT  --   --  170  HEPARINUNFRC  --   --  >1.10*  CREATININE 0.66 0.75 0.75   Estimated Creatinine Clearance: 43.7 mL/min (by C-G formula based on SCr of 0.75 mg/dL).  Medical History: Past Medical History:  Diagnosis Date   Anxiety    Diabetes (HCC) 10/27/2013   DVT (deep venous thrombosis) (HCC)    Essential tremor 10/27/2013   High cholesterol 1999   Hypertension    Ischemic stroke (HCC) 07/18/2014   Obesity, unspecified 10/27/2013   Pneumonia    Unspecified hereditary and idiopathic peripheral neuropathy 10/27/2013    Assessment: 89 yof admitted with new onset Afib and volume overload. CTA chest found acute on chronic PE. LE doppler also shows right DVT. Hospital course complicated by previous GIB. Family in agreement to begin IV heparin. Pharmacy consulted to manage inpatient.   Hgb stable 9s, platelets are normal. No issues with heparin infusion or signs of bleeding reported. 11/10 heparin level > 1.1, supratherapeutic. Clarified with RN and phlebotomy, unable to confirm if lab is erroneous. Repeat heparin level in 2 hours still significantly elevated. Holding heparin infusion for 1 hour (held at 10AM). Per discussion with FM team, discussing [facility] plan with family before transitioning to apixaban. Will forego the loading dose due to recent bleed.  Goal of Therapy:  Heparin level 0.3-0.5 units/ml d/t recent  GIB Monitor platelets by anticoagulation protocol: Yes   Plan:   Hold heparin infusion for 1 hour F/u starting apixaban vs resuming heparin infusion pending family discussion Continue to monitor H&H and platelets      Thank you for allowing pharmacy to be a part of this patient's care.  Thelma Barge, PharmD Clinical Pharmacist

## 2022-10-04 NOTE — Progress Notes (Signed)
[name], DO  Physician Physical Medicine and Rehabilitation   PMR Pre-admission    Signed   Date of Service: 10/03/2022  6:17 PM  Related encounter: ED to Hosp-Admission (Discharged) from 09/16/2022 in [facility] 4E CV SURGICAL PROGRESSIVE CARE   Signed      Show:Clear all _0 Written_1 Templated_2 Copied  Added by: _3 Cristina Gong, RN_4 [name], DO  _5 Hover for details PMR Admission Coordinator Pre-Admission Assessment   Patient: [name] [name] is an 88 y.o., female MRN: [id number] DOB: November 24, 1930 Height: 5' (152.4 cm) Weight: 77.1 kg   Insurance Information HMO:     PPO:      PCP:      IPA:      80/20:      OTHER:  PRIMARY: Medicare a and b [id number]      Policy#: pt      Benefits:  Phone #: passport one course online     Name: 11/9 Eff. Date: a 11/25/2000 and b 07/26/2000     Deduct: $1600      Out of Pocket Max: none      Life Max: none CIR: 100%      SNF: 20 full days Outpatient: 80%     Co-Pay: 20% Home Health: 100%      Co-Pay: none DME: 80%     Co-Pay: 20% Providers: in network  SECONDARY: Medicaid of [place]      Policy#: [id number]   Financial Counselor:       Phone#:    The "Data Collection Information Summary" for patients in Inpatient Rehabilitation Facilities with attached "Privacy Act [place] Records" was provided and verbally reviewed with: Patient   Emergency Contact Information Contact Information       Name Relation Home Work Mobile    Steele Son [phone]      Basilia Jumbo     [place] Granddaughter     [phone]           Current Medical History  Patient Admitting Diagnosis: Debility   History of Present Illness:  88 year old limited English speaking right-handed female with history of 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  Presented 09/16/2022 with generalized weakness x2 days as well as increased shortness of breath.     Chest x-ray showed increased mild to moderate right and unchanged tiny left pleural effusions.  Chemistries unremarkable except glucose 114, GFR 56, troponin negative, BNP 465.  EKG showed atrial fibrillation with RVR and placed on intravenous Cardizem.  Echocardiogram with ejection fraction of 55 to 60% no wall motion abnormalities.   Patient initially scheduled for cardioversion by cardiology services but subsequently developed acute respiratory distress requiring noninvasive imaging treated with aggressive diuresis.  Course complicated by upper GI bleed due to gastric ulcer with follow-up gastroenterology services undergoing upper GI endoscopy 09/26/2022 per Dr. Lyndel Safe and placed on PPI twice daily x12 weeks.  Noted bouts of confusion initial restlessness CT of the head completed showing no acute findings 09/22/2022.  Patient stabilized underwent TEE/DCCV 09/24/2022 per cardiology services and currently maintained on amiodarone and Toprol as well as Cardizem/Lanoxin.  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  Patient was cleared to begin intravenous heparin and transitioned to Eliquis.  No current plan for Eliquis at this time due to GI bleed.  IV diuresis transitioned to Demadex 20 mg p.o. daily.  Patient was cleared to begin subcutaneous  heparin for DVT prophylaxis 09/29/2022.  Intermittent bouts of abdominal pain with CT of the abdomen 09/30/2022 showing mild gaseous distention of small bowel in the lower abdomen up to 3.5 cm suggesting possible ileus.  Her diet has been advanced to a mechanical soft.  She has had some elevated LFTs felt to be possibly related to amiodarone.  Hepatitis panel negative.  Urinalysis study 10/02/2022 with negative nitrite moderate leukocytosis of any bacteria placed on Omnicef empirically with urine culture pending.    Patient's medical [facility] has been reviewed by the rehabilitation admission coordinator and physician.   Past Medical History      Past Medical History:  Diagnosis Date   Anxiety     Diabetes ([place]) 10/27/2013   DVT (deep venous thrombosis) (HCC)     Essential tremor 10/27/2013   High cholesterol 1999   Hypertension     Ischemic stroke ([place]) 07/18/2014   Obesity, unspecified 10/27/2013   Pneumonia     Unspecified hereditary and idiopathic peripheral neuropathy 10/27/2013    Has the patient had major surgery during 100 days prior to admission? No   Family History   family history includes Coronary artery disease in an other family member; Heart Problems in her brother and sister; Stroke in her mother.   Current Medications   Current Facility-Administered Medications:    0.9 %  sodium chloride infusion, 250 mL, Intravenous, Continuous, [place], Charisse March, MD, Stopped at 09/26/22 1457   acetaminophen (TYLENOL) tablet 500 mg, 500 mg, Oral, Q6H, Mabe, Gerald, MD   [COMPLETED] amiodarone (PACERONE) tablet 200 mg, 200 mg, Oral, BID, 200 mg at 09/29/22 2217 **FOLLOWED BY** amiodarone (PACERONE) tablet 200 mg, 200 mg, Oral, Daily, Dixie Dials, MD, 200 mg at 10/04/22 1025   amoxicillin (AMOXIL) capsule 500 mg, 500 mg, Oral, Q12H, Hoang, Daniela B, MD   apixaban (ELIQUIS) tablet 5 mg, 5 mg, Oral, BID, Wellborn, Adrienne B, RPH   atorvastatin (LIPITOR) tablet 10 mg,  10 mg, Oral, QPM, Doylene Canard, Ajay, MD, 10 mg at 10/03/22 1825   Chlorhexidine Gluconate Cloth 2 % PADS 6 each, 6 each, Topical, Daily, Chand, Currie Paris, MD, 6 each at 10/04/22 1037   cycloSPORINE (RESTASIS) 0.05 % ophthalmic emulsion 1 drop, 1 drop, Both Eyes, BID, Minor, Grace Bushy, NP, 1 drop at 10/04/22 1036   diclofenac Sodium (VOLTAREN) 1 % topical gel 2 g, 2 g, Topical, QID PRN, Darci Current, DO   digoxin (LANOXIN) tablet 0.0625 mg, 0.0625 mg, Oral, Daily, Anders Simmonds, MD, 0.0625 mg at 10/04/22 1025   diltiazem (CARDIZEM) tablet 30 mg, 30 mg, Oral, Q8H, Kadakia, Ajay, MD, 30 mg at 10/04/22 0650   DULoxetine (CYMBALTA) DR capsule 20 mg, 20 mg, Oral, Daily, Freda Jackson B, MD, 20 mg at 10/03/22 1334   gabapentin (NEURONTIN) capsule 100 mg, 100 mg, Oral, QHS, Minor, Grace Bushy, NP, 100 mg at 10/03/22 2152   guaiFENesin (ROBITUSSIN) 100 MG/5ML liquid 5 mL, 5 mL, Oral, Q4H PRN, Coy Saunas, Charisse March, MD  lactose free nutrition (BOOST PLUS) liquid 237 mL, 237 mL, Oral, TID WC, Lacinda Axon, MD, 237 mL at 10/04/22 1035   levETIRAcetam (KEPPRA) tablet 500 mg, 500 mg, Oral, BID, Freda Jackson B, MD, 500 mg at 10/04/22 0420   magnesium oxide (MAG-OX) tablet 400 mg, 400 mg, Oral, Daily, Dixie Dials, MD, 400 mg at 10/04/22 1025   metoprolol succinate (TOPROL-XL) 24 hr tablet 50 mg, 50 mg, Oral, QPM, Dixie Dials, MD, 50 mg at 10/03/22 1826   multivitamin with minerals tablet 1 tablet, 1 tablet, Oral, Daily, Dixie Dials, MD, 1 tablet at 10/04/22 1025   Oral care mouth rinse, 15 mL, Mouth Rinse, 4 times per day, Maryjane Hurter, MD, 15 mL at 10/04/22 1035   Oral care mouth rinse, 15 mL, Mouth Rinse, PRN, Maryjane Hurter, MD   pantoprazole (PROTONIX) EC tablet 40 mg, 40 mg, Oral, BID, Spero Geralds, MD, 40 mg at 10/04/22 1028   polyethylene glycol (MIRALAX / GLYCOLAX) packet 17 g, 17 g, Oral, Daily, Leeanne Rio, MD, 17 g at 09/29/22 0956   potassium chloride (KLOR-CON M) CR  tablet 10 mEq, 10 mEq, Oral, BID, Dixie Dials, MD, 10 mEq at 10/04/22 1028   senna (SENOKOT) tablet 8.6 mg, 1 tablet, Oral, Daily PRN, Freda Jackson B, MD, 8.6 mg at 09/25/22 0820   simethicone (MYLICON) 40 WU/1.3KG suspension 40 mg, 40 mg, Oral, Q6H PRN, Lacinda Axon, MD, 40 mg at 10/02/22 1025   torsemide (DEMADEX) tablet 20 mg, 20 mg, Oral, Daily, Dixie Dials, MD, 20 mg at 10/04/22 1025   Patients Current Diet:  Diet Order                  DIET DYS 3 Room service appropriate? Yes with Assist; Fluid consistency: Thin  Diet effective now                         Precautions / Restrictions Precautions Precautions: Fall, Other (comment) Precaution Comments: Watch O2 Restrictions Weight Bearing Restrictions: No    Has the patient had 2 or more falls or a fall with injury in the past year? No   Prior Activity Level Limited Community (1-2x/wk): Mod I with RW. Was Mod I until 3- 4 weeks pta   Prior Functional Level Self Care: Did the patient need help bathing, dressing, using the toilet or eating? Needed some help   Indoor Mobility: Did the patient need assistance with walking from room to room (with or without device)? Independent   Stairs: Did the patient need assistance with internal or external stairs (with or without device)? Independent   Functional Cognition: Did the patient need help planning regular tasks such as shopping or remembering to take medications? Independent   Patient Information Are you of Hispanic, Latino/a,or Spanish origin?: A. No, not of Hispanic, Latino/a, or Spanish origin What is your race?: Z. None of the above ([name]) Do you need or want an interpreter to communicate with a doctor or health care staff?: 0. No (requests family to interpret)   Patient's Response To:  Health Literacy and Transportation Is the patient able to respond to health literacy and transportation needs?: Yes Health Literacy - How often do you need to have  someone help you when you read instructions, pamphlets, or other written material from your doctor or pharmacy?: Sometimes In the past 12 months, has lack of transportation kept you from medical appointments or from getting medications?: No In  the past 12 months, has lack of transportation kept you from meetings, work, or from getting things needed for daily living?: No   [place] / [place] Devices/Equipment: Environmental consultant (specify type) Home Equipment: Rolling Walker (2 wheels), Other (comment) (O2)   Prior Device Use: Indicate devices/aids used by the patient prior to current illness, exacerbation or injury? Walker   Current Functional Level Cognition   Overall Cognitive Status: Within Functional Limits for tasks assessed Difficult to assess due to: Non-English speaking Orientation Level: Oriented X4 Following Commands: Follows one step commands with increased time General Comments: per son she is at her baseline    Extremity Assessment (includes Sensation/Coordination)   Upper Extremity Assessment: Generalized weakness  Lower Extremity Assessment: Defer to PT evaluation     ADLs   Overall ADL's : Needs assistance/impaired Eating/Feeding: Minimal assistance, Sitting Eating/Feeding Details (indicate cue type and reason): tremor interferring with ability to self feed - may benefit fomr tubing and lidded cup Grooming: Wash/dry face, Set up, Sitting Grooming Details (indicate cue type and reason): pt able to wash face at EOB, but would required increased assist for bimanual tasks. assist required for balance if unsupported. Upper Body Bathing: Moderate assistance Lower Body Bathing: Maximal assistance Upper Body Dressing : Moderate assistance, Sitting Lower Body Dressing: Total assistance, +2 for physical assistance, +2 for safety/equipment, Sit to/from stand Toilet Transfer: +2 for physical assistance, +2 for safety/equipment, Rolling walker (2 wheels),  Moderate assistance (simulated) Toilet Transfer Details (indicate cue type and reason): using stedy to recliner Toileting- Clothing Manipulation and Hygiene: +2 for safety/equipment, Sit to/from stand, Maximal assistance Functional mobility during ADLs: Moderate assistance, +2 for physical assistance, Rolling walker (2 wheels) General ADL Comments: limited by poor endurance; easily fatigues     Mobility   Overal bed mobility: Needs Assistance Bed Mobility: Supine to Sit Rolling: Min assist Sidelying to sit: Mod assist Supine to sit: Mod assist, +2 for physical assistance, Min assist Sit to supine: +2 for physical assistance, Mod assist General bed mobility comments: min-mod+2 supine <> sit with trunk assist and utilizing pad to get hips closer to EOB; pt with great insight on how to advance mobility by beginning to initiate but ultimately requires assist for successful movement     Transfers   Overall transfer level: Needs assistance Equipment used: Rolling walker (2 wheels) Transfers: Sit to/from Stand Sit to Stand: +2 physical assistance, Min assist Bed to/from chair/wheelchair/BSC transfer type:: Via Lift equipment Squat pivot transfers: Mod assist Step pivot transfers: Mod assist, +2 physical assistance, +2 safety/equipment Transfer via Lift Equipment: Stedy General transfer comment: modA+2 to stand, pt driven to stand up and did not allow for therapist to correct UE placement, will continue to address appropriate sequence for successful transfers     Ambulation / Gait / Stairs / Wheelchair Mobility   Ambulation/Gait General Gait Details: Deferred Pre-gait activities: lateral stepping to L along side of bed with verbal and tactile cues for RW negotiation and sequencing of LEs, modA+2 throughout     Posture / Balance Dynamic Sitting Balance Sitting balance - Comments: fluctuating from min guard-modA sitting EOB Balance Overall balance assessment: Needs assistance Sitting-balance  support: Feet supported, No upper extremity supported Sitting balance-Leahy Scale: Fair Sitting balance - Comments: fluctuating from min guard-modA sitting EOB Postural control: Posterior lean Standing balance support: Bilateral upper extremity supported, During functional activity Standing balance-Leahy Scale: Poor Standing balance comment: reliant on UEs for upright positioning, did not attempt single hand support  Special needs/care consideration Family interprets for her/Persian IV heparin    Previous Home Environment  Living Arrangements:  (lives with son)  Lives With: Son Available Help at Discharge: Available 24 hours/day (son will arrange 24/7 supervision when he is at work twice weekly) Type of Home: [place]: One level Home Access: Level entry Bathroom Shower/Tub: Multimedia programmer: Standard Bathroom Accessibility: Yes [place]: No (OP therapy) Additional Comments: Pt's son present and interpreting. Pt on supplamental O2 at home intermittently (only with SOB)   Discharge Living Setting Plans for Discharge Living Setting: Patient's home, Lives with (comment) (son) Type of Home at Discharge: House Discharge Home Layout: One level Discharge Home Access: Level entry Discharge Bathroom Shower/Tub: Walk-in shower Discharge Bathroom Toilet: Standard Discharge Bathroom Accessibility: Yes How Accessible: Accessible via walker Does the patient have any problems obtaining your medications?: No   Social/Family/Support Systems Patient Roles: Parent Contact Information: son, [name] Anticipated Caregiver: son and family Anticipated Caregiver's Contact Information: see contacts Ability/Limitations of Caregiver: son works 6 hrs twice [employer] Caregiver Availability: 24/7 Discharge Plan Discussed with Primary Caregiver: Yes Is Caregiver In Agreement with Plan?: Yes Does Caregiver/Family have Issues with Lodging/Transportation while Pt is in  Rehab?: No   Goals Patient/Family Goal for Rehab: supervision to min asisst with PT and OT Expected length of stay: ELOS 10 to 14 days Cultural Considerations: [name] Additional Information: Was at [place] 2015 for 7 days after CVA Pt/Family Agrees to Admission and willing to participate: Yes Program Orientation Provided & Reviewed with Pt/Caregiver Including Roles  & Responsibilities: Yes   Decrease burden of Care through IP rehab admission: n/a   Possible need for SNF placement upon discharge: not anticipated Patient Condition: I have reviewed medical records from [facility], spoken with CM, and patient and son. I met with patient at the bedside for inpatient rehabilitation assessment.  Patient will benefit from ongoing PT and OT, can actively participate in 3 hours of therapy a day 5 days of the week, and can make measurable gains during the admission.  Patient will also benefit from the coordinated team approach during an Inpatient Acute Rehabilitation admission.  The patient will receive intensive therapy as well as Rehabilitation physician, nursing, social worker, and care management interventions.  Due to bladder management, bowel management, safety, skin/wound care, disease management, medication administration, pain management, and patient education the patient requires 24 hour a day rehabilitation nursing.  The patient is currently mod assist overall with mobility and basic ADLs.  Discharge setting and therapy post discharge at home with home health is anticipated.  Patient has agreed to participate in the Acute Inpatient Rehabilitation Program and will admit today.   Preadmission Screen Completed By:  Cleatrice Burke, 10/04/2022 11:16 AM ______________________________________________________________________   Discussed status with Dr. Tressa Busman on 10/04/22 at 1117 and received approval for admission today.   Admission Coordinator:  Cleatrice Burke, RN, time [id number]  Date 10/04/22    Assessment/Plan: Diagnosis: Does the need for close, 24 hr/day Medical supervision in concert with the patient's rehab needs make it unreasonable for this patient to be served in a less intensive setting? Yes Co-Morbidities requiring supervision/potential complications: atrial fibrillation with RVR, heart failure, PE/DVT on heparin drip, AHRF with PE and bilateral pleural effusion, upper GI bleed, UTI, typpe 2 diabetes, Htn, thrombocytopenia, seizures, and severe tremors Due to bladder management, bowel management, skin/wound care, disease management, medication administration, pain management, and patient education, does the patient require 24  hr/day rehab nursing? Yes Does the patient require coordinated care of a physician, rehab nurse, PT, OT to address physical and functional deficits in the context of the above medical diagnosis(es)? Yes Addressing deficits in the following areas: balance, endurance, locomotion, strength, transferring, bowel/bladder control, bathing, dressing, feeding, grooming, and toileting Can the patient actively participate in an intensive therapy program of at least 3 hrs of therapy 5 days a week? Yes The potential for patient to make measurable gains while on inpatient rehab is good Anticipated functional outcomes upon discharge from inpatient rehab: min assist PT, min assist OT Estimated rehab length of stay to reach the above functional goals is: 10-14 days Anticipated discharge destination: Home 10. Overall Rehab/Functional Prognosis: excellent     MD Signature:   [name], DO 10/04/2022          Revision History

## 2022-10-04 NOTE — Assessment & Plan Note (Signed)
As seen on CT. Appears most areas are chronic. Suspect 2/2 prolonged immobilization and overall age. Discussed anticoagulation with family who agreed on heparin drip without bolus given Gi bleed earlier in admission. -Continue heparin drip -Monitor O2 status, currently remains on 2L [place]

## 2022-10-04 NOTE — Progress Notes (Signed)
ANTICOAGULATION CONSULT NOTE  Pharmacy Consult for heparin Indication: pulmonary embolus and DVT  Allergies  Allergen Reactions   Tape Rash    Please do not use Plastic Tape    Patient Measurements:   Heparin Dosing Weight: 63 kg  Vital Signs: Temp: 97.9 F (36.6 C) (11/10 1508) Temp Source: Oral (11/10 1402) BP: 151/49 (11/10 1508) Pulse Rate: 68 (11/10 1508)  Labs: Recent Labs    10/02/22 0148 10/03/22 0135 10/04/22 0622 10/04/22 0837  HGB  --   --  9.2*  --   HCT  --   --  28.2*  --   PLT  --   --  170  --   HEPARINUNFRC  --   --  >1.10* >1.10*  CREATININE 0.66 0.75 0.75  --     Estimated Creatinine Clearance: 43.7 mL/min (by C-G formula based on SCr of 0.75 mg/dL).  Medical History: Past Medical History:  Diagnosis Date   Anxiety    Diabetes (HCC) 10/27/2013   DVT (deep venous thrombosis) (HCC)    Essential tremor 10/27/2013   High cholesterol 1999   Hypertension    Ischemic stroke (HCC) 07/18/2014   Obesity, unspecified 10/27/2013   Pneumonia    Unspecified hereditary and idiopathic peripheral neuropathy 10/27/2013    Assessment: 89 yof admitted with new onset Afib and volume overload. CTA chest found acute on chronic PE. LE doppler also shows right DVT. Hospital course complicated by previous GIB. Family in agreement to begin IV heparin. Pharmacy consulted to manage inpatient.   Hgb stable 9s, platelets are normal. No issues with heparin infusion or signs of bleeding reported. 11/10 heparin level > 1.1, supratherapeutic. Clarified with RN and phlebotomy, unable to confirm if lab is erroneous. Repeat heparin level in 2 hours still significantly elevated. Holding heparin infusion for 1 hour (held at 10AM). Per discussion with FM team, discussing [facility] plan with family before transitioning to apixaban. Family opting to continue on heparin infusion for now.   Goal of Therapy:  Heparin level 0.3-0.5 units/ml d/t recent GIB Monitor platelets by anticoagulation  protocol: Yes   Plan:  Resume heparin infusion at 900 units/hr Check heparin level in 8 hours and daily while on heparin Continue to monitor H&H and platelets    Thank you for allowing pharmacy to be a part of this patient's care.  Thelma Barge, PharmD Clinical Pharmacist

## 2022-10-04 NOTE — Progress Notes (Addendum)
Inpatient Rehabilitation Admission Medication Review by a Pharmacist  A complete drug regimen review was completed for this patient to identify any potential clinically significant medication issues.  High Risk Drug Classes Is patient taking? Indication by Medication  Antipsychotic No   Anticoagulant Yes Heparin drip for now  to Apixaban - DVT, PE  Antibiotic Yes PO amoxicillin to complete course for UTI  Opioid No   Antiplatelet No   Hypoglycemics/insulin No   Vasoactive Medication Yes Amiodarone, digoxin, diltiazem, Toprol XL, torsemide - Afib, CHF  Chemotherapy No   Other Yes Duloxetine - mood Gabapentin - pain Keppra - seizures, tremor Pantoprazole - Reflux  Potassium / magnesium - supplement     Type of Medication Issue Identified Description of Issue Recommendation(s)  Drug Interaction(s) (clinically significant)     Duplicate Therapy     Allergy     No Medication Administration End Date     Incorrect Dose     Additional Drug Therapy Needed     Significant med changes from prior encounter (inform family/care partners about these prior to discharge).    Other       Clinically significant medication issues were identified that warrant physician communication and completion of prescribed/recommended actions by midnight of the next day:  No   Pharmacist comment: None  Time spent performing this drug regimen review (minutes):  20 minutes  Thank you Okey Regal, PharmD

## 2022-10-04 NOTE — TOC Transition Note (Signed)
Transition of Care [facility] [place]) - CM/SW Discharge Note   Patient Details  Name: [name] [name] MRN: [id number] Date of Birth: Mar 28, 1930  Transition of Care [facility] CM/SW Contact:  Gala Lewandowsky, RN Phone Number: 10/04/2022, 11:10 AM   Clinical Narrative:  Case Manager received notification from Inpatient rehab Admissions Coordinator that the patient would be admitting to CIR today. Family is aware of disposition plan. No further needs identified at this time by Case Manager.    Final next level of care: IP Rehab Facility Barriers to Discharge: No Barriers Identified  Readmission Risk Interventions     No data to display

## 2022-10-04 NOTE — Discharge Summary (Cosign Needed Addendum)
[facility] Discharge Summary  Patient name: [name] [name] Medical record number: [id number] Date of birth: 26-Mar-1930 Age: 88 y.o. Gender: female Date of Admission: 09/16/2022  Date of Discharge: 10/04/2022 Admitting Physician: Zenia Resides, MD  Primary Care Provider: Zola Button, MD Consultants: CCM, cardiology, GI  Indication for Hospitalization: Afib with RVR  Brief Hospital Course:  [name] [name] is a 88 y.o. female who presented with leg weakness and SOB and found to have pleural effusions/volume overload with what is likely acute on chronic CHF exacerbation in the setting of new onset a-fib with RVR. PMH includes CVA, essential tremor, HTN, HLD, and seizures.   Atrial fibrillation with RVR ([name] [name]) Presented in new onset a-fib w/ RVR on 10/23. Placed on digoxin, diltiazem, and metoprolol for rate and rhythm control. Echo demonstrated moderate LVH with normal LV function. Cardiology added amiodarone for increasing rates, though rates increased to 160s with increasing respiratory distress. She was then transferred to ICU on BiPAP on 10/27. On 10/31, she was successfully cardioverted to NSR. She was weaned off BiPAP and returned to the floor on 11/4. She remained rate controlled throughout remainder of admission.   Acute on chronic diastolic heart failure (HCC)  Pleural effusion  Acute on chronic PE Presented with worsening SOB, LE weakness and pitting edema, and O2 requirement of 2L [place]. Had pleural effusions on CXR likely 2/2 afib. BNP elevated with flat trops. IV lasix given for diuresis. She also reported some chest pain; EKG, troponins, and CXR normal at that time. Suspect pain 2/2 pleural effusions from overload. Chest pain resolved. DVT US on RLE (below) with DVT, in context of continued O2 requirement, yielded CTA PE that revealed acute on chronic scattered PE with signs of right heart strain. She was placed on heparin drip.  By discharge, she  was euvolemic without further chest pain.  Upper GI bleed and abdominal pain Had large melanotic stool in the ICU on 10/29. 1u pRBCs were given for Hgb 7. GI consulted and performed EGD which demonstrated nonbleeding gastric ulcer. Hgb remained stable thereafter. Patient also reported increasing abdominal pain once of the ICU; CTAP with likely ileus. Miralax given and PT/OT assisted with mobility, and patient began having more stools. LFTs were also noted over admission; GI obtained abd [place] which was unremarkable. Hepatitis panel also negative. Cardiology consulted for potential amiodarone interaction and reduced doses of statin and eventually amio. By discharge, she was placed on ferrous sulfate 325 mg daily, and LFTs were stable and downtrending.  Bilateral hip and leg pain Had difficulty ambulating before admission. Known chronic pain in left leg from CVA. Degenerative changes in lumbar spine also known. Left hip XR without fracture. Over admission, right hip and leg also began to hurt. CTAP obtained for abdominal pain but did not demonstrate any MSK findings of hips. DVT US of RLE demonstrated DVT. CTA PE was obtained as above with PE; heparin drip was started.  IR also recommended serial [place] of DVT as below if she d/c anticoagulation. PT/OT evaluated for overall deconditioning after the stay and recommended her for CIR.  Superficial thrombosis of right basilar vein Presented with erythema and swelling around IV site used for amiodarone infusion when back to the floor. Exam notable for tenderness and firmness in addition to erythema. DVT US of RUE with thrombosis of basilar vein. Alternating warm and cool compresses with voltaren gel was utilized for conservative management. By discharge, her symptoms were stable.  UTI Presented with  dark, brown-colored urine over admission. UA on 10/29 suspicious for UTI, though patient did not have suprapubic tenderness per chart review. Repeat UA on 11/8 further  demonstrated UTI, and with her suprapubic pain, she was started on cefdinir. Ucx returned with E. Faecalis and E. Coli; antibiotics were switched to 4 days of amoxicillin.   Chronic stable conditions HLD - atorvastatin 20mg  daily HTN - Metoprolol 25mg  BID Prior CVA w/R weakness - transition from Plavix to Eliquis Diet controlled T2DM - monitor glucose on BMP Neuropathy - Gabapentin 100mg  QHS Seizures - Keppra and Primidone Anxiety - Xanax 0.25mg  BID PRN, Cymbalta 20mg  daily Thrombocytopenia - improved to 170 over admission Tremors - was on xanax and primidone at home; both d/c given side effects  Issues for follow up 1. Neuro follow up outpatient for essential tremor 2. Abnormal endometrial distension and or fluid at 13 mm - no further evaluation per family 3. Reassess CBC and CMP 4. Consider repeat DVT US of RLE on 11/13 and on 11/22 to ensure no progression of DVT if anticoagulation is discontinued 5. Amlodipine held at discharge, please reassess and adjust as appropriate 6. Consider weaning diltiazem and increasing beta blocker to aid in tremor given she is on digoxin and is predisposed to interactions 7. Follow up iron studies and need for more iron supplementation after GI bleed 8. Started on Torsemide per Cards. Discontinued Lasix, should follow up with Cards on decision on whether to start Lasix.   Discharge Diagnoses/Problem List:  Present on Admission:  Atrial fibrillation with RVR ([place])  Essential tremor  HTN (hypertension)  HLD (hyperlipidemia) -Resolved GI bleed -UTI -Acute on chronic PE -Superficial thrombosis of RUE -DVT of RLE -CHF   Disposition: CIR  Discharge Condition: Stable  Discharge Exam:  Blood pressure 120/73, pulse 77, temperature 97.7 F (36.5 C), temperature source Oral, resp. rate 20, height 5' (1.524 m), weight 77.1 kg, SpO2 94 %.  General: Alert and oriented, in NAD Skin: Warm, dry HEENT: NCAT, EOM grossly normal, midline nasal  septum Cardiac: RRR, no m/r/g appreciated Respiratory: CTAB anteriorly, breathing and speaking comfortably on 1L [place] Abdominal: Soft, mildly TTP throughout, nondistended, normoactive bowel sounds Extremities: Moves all extremities grossly equally in bed though weak Neurological: No gross focal deficit Psychiatric: Appropriate mood and affect   Significant Procedures: ICU stay, EGD  Significant Labs and Imaging:  Recent Labs  Lab 10/04/22 0622  WBC 7.3  HGB 9.2*  HCT 28.2*  PLT 170   Recent Labs  Lab 10/03/22 0135 10/04/22 0622  NA 135 131*  K 4.3 4.1  CL 91* 91*  CO2 35* 32  GLUCOSE 132* 131*  BUN 17 17  CREATININE 0.75 0.75  CALCIUM 8.6* 8.3*  ALKPHOS 88 101  AST 101* 93*  ALT 160* 146*  ALBUMIN 2.7* 2.7*   CT Angio Chest Pulmonary Embolism (PE) W or WO Contrast  Result Date: 10/03/2022 CLINICAL DATA:  DVT and prolonged immobilization. EXAM: CT ANGIOGRAPHY CHEST WITH CONTRAST TECHNIQUE: Multidetector CT imaging of the chest was performed using the standard protocol during bolus administration of intravenous contrast. Multiplanar CT image reconstructions and MIPs were obtained to evaluate the vascular anatomy. RADIATION DOSE REDUCTION: This exam was performed according to the departmental dose-optimization program which includes automated exposure control, adjustment of the mA and/or kV according to patient size and/or use of iterative reconstruction technique. CONTRAST:  39mL OMNIPAQUE IOHEXOL 350 MG/ML SOLN COMPARISON:  Chest radiograph 09/28/2022 FINDINGS: Despite efforts by the technologist and patient, motion artifact is  present on today's exam and could not be eliminated. This reduces exam sensitivity and specificity. Cardiovascular: There is substantial primarily chronic pulmonary embolus with notable peripheral thrombus including in the right and left pulmonary arteries. Scattered areas of likely acute superimposed pulmonary embolus, for example in the left lower lobe on  image 231 series 7 although also in the bilateral upper lobes. Right ventricular to left ventricular ratio is elevated at 1.3. Intervertebral calor septal thickness of 1.8 cm compatible with left ventricular hypertrophy. Mild mitral and aortic valve calcification. Coronary, aortic arch, and branch vessel atherosclerotic vascular disease. Mediastinum/Nodes: Probable wall thickening in the distal half of the thoracic esophagus. Reflux would be a common cause. Lungs/Pleura: Moderate right trace left pleural effusion with passive atelectasis. Airway thickening and airway plugging in the left lower lobe. Nonspecific mild peripheral airspace opacity anteriorly in the left upper lobe measuring 1.4 by 0.9 cm on image 69 series 6. Upper Abdomen: Abdominal aortic atherosclerosis. There is atheromatous calcification proximally in the superior mesenteric artery. Musculoskeletal: L1 compression fracture as shown on 09/30/2022. Thoracic spondylosis with multilevel bridging spurring. Review of the MIP images confirms the above findings. IMPRESSION: 1. Acute on chronic pulmonary embolus with the bulk of the filling defect in the pulmonary arterial tree peripheral compatible with chronic embolus. Scattered areas of likely acute superimposed pulmonary embolus in the bilateral upper lobes and left lower lobe. Positive for acute PE with CT evidence of right heart strain (RV/LV Ratio = 1.3 ) consistent with at least submassive (intermediate risk) PE. The presence of right heart strain has been associated with an increased risk of morbidity and mortality. Please refer to the "Code PE Focused" order set in EPIC. 2. Moderate right and trace left pleural effusion with passive atelectasis. 3. Airway thickening and airway plugging in the left lower lobe. 4. There is some focal consolidation anteriorly in the left upper lobe which is nonspecific. However this could be from a small pulmonary infarct. 5. Left ventricular hypertrophy and  cardiomegaly. 6. Coronary and aortic atherosclerosis along with atherosclerosis in the proximal superior mesenteric artery. Mild mitral and aortic valve calcification. 7. Chronic L1 compression fracture. Aortic Atherosclerosis (ICD10-I70.0). Critical Value/emergent results were called by telephone at the time of interpretation on 10/03/2022 at 7:06 pm to provider Dr. Sherrye Payor, who verbally acknowledged these results. Electronically Signed   By: Van Clines M.D.   On: 10/03/2022 19:06   VAS [place] LOWER EXTREMITY VENOUS (DVT)  Result Date: 10/03/2022  Lower Venous DVT Study Patient Name:  [name] [name]  Date of Exam:   10/02/2022 Medical Rec #: [id number]         Accession #:    [id number] Date of Birth: 1929/12/03        Patient Gender: F Patient Age:   78 years Exam Location:  [facility] Procedure:      VAS [place] LOWER EXTREMITY VENOUS (DVT) Referring Phys: Marye Round MCINTYRE --------------------------------------------------------------------------------  Indications: Pain in right leg.  Comparison Study: 09-22-2022 Prior bilateral lower extremity venous was negative                   for DVT. Performing Technologist: Darlin Coco RDMS, RVT  Examination Guidelines: A complete evaluation includes B-mode imaging, spectral Doppler, color Doppler, and power Doppler as needed of all accessible portions of each vessel. Bilateral testing is considered an integral part of a complete examination. Limited examinations for reoccurring indications may be performed as noted. The reflux portion of the exam is performed with  the patient in reverse Trendelenburg.  +---------+---------------+---------+-----------+----------+-----------------+ RIGHT    CompressibilityPhasicitySpontaneityPropertiesThrombus Aging    +---------+---------------+---------+-----------+----------+-----------------+ CFV      Full           Yes      Yes                                     +---------+---------------+---------+-----------+----------+-----------------+ SFJ      Full                                                           +---------+---------------+---------+-----------+----------+-----------------+ FV Prox  Full                                                           +---------+---------------+---------+-----------+----------+-----------------+ FV Mid   Full                                                           +---------+---------------+---------+-----------+----------+-----------------+ FV DistalFull                                                           +---------+---------------+---------+-----------+----------+-----------------+ PFV      Full                                                           +---------+---------------+---------+-----------+----------+-----------------+ POP      Full           Yes      Yes                                    +---------+---------------+---------+-----------+----------+-----------------+ PTV      Partial        Yes      Yes                  Age Indeterminate +---------+---------------+---------+-----------+----------+-----------------+ PERO     Full                                                           +---------+---------------+---------+-----------+----------+-----------------+   +----+---------------+---------+-----------+----------+--------------+ LEFTCompressibilityPhasicitySpontaneityPropertiesThrombus Aging +----+---------------+---------+-----------+----------+--------------+ CFV Full           Yes      Yes                                 +----+---------------+---------+-----------+----------+--------------+  Summary: RIGHT: - Findings consistent with age indeterminate deep vein thrombosis involving the right posterior tibial veins. - No cystic structure found in the popliteal fossa.  LEFT: - No evidence of common femoral vein obstruction.  *See  table(s) above for measurements and observations. Electronically signed by Gerarda Fraction on 10/03/2022 at 2:35:33 PM.    Final    CT ABDOMEN PELVIS W CONTRAST  Result Date: 09/30/2022 CLINICAL DATA:  Acute nonlocalized abdominal pain. EXAM: CT ABDOMEN AND PELVIS WITH CONTRAST TECHNIQUE: Multidetector CT imaging of the abdomen and pelvis was performed using the standard protocol following bolus administration of intravenous contrast. RADIATION DOSE REDUCTION: This exam was performed according to the departmental dose-optimization program which includes automated exposure control, adjustment of the mA and/or kV according to patient size and/or use of iterative reconstruction technique. CONTRAST:  3mL OMNIPAQUE IOHEXOL 350 MG/ML SOLN COMPARISON:  Ultrasound 09/26/2022. FINDINGS: Lower chest: Moderate right and small left pleural effusion. Associated compressive atelectasis in the lung bases. Hepatobiliary: Tiny hypodensity in the inferior right lobe is too small to characterize but likely small cyst. No suspicious liver lesion. Gallbladder physiologically distended, no calcified stone. No biliary dilatation. Pancreas: Mildly atrophic. No ductal dilatation or inflammation. No pancreatic mass. Spleen: Normal in size without focal abnormality. Adrenals/Urinary Tract: Normal adrenal glands. No hydronephrosis. No evidence of solid renal lesion or stone. Unremarkable urinary bladder. Stomach/Bowel: Partially distended stomach. Mild gaseous distention of small bowel in the lower abdomen, up to 3.5 cm. Enteric contrast progresses distal to this to the level of the colon. There is no associated wall thickening or inflammation. The appendix is not confidently visualized. There is mild colonic distension of the ascending, transverse and descending colon with air and stool. The sigmoid colon is less distended, no definite wall thickening. Vascular/Lymphatic: Prominent aortic atherosclerosis. Aortic tortuosity without aneurysm.  Retroaortic left renal vein. Patent portal and splenic veins. No enlarged lymph nodes in the abdomen or pelvis. Reproductive: Abnormal endometrial distension and or fluid at 13 mm. No adnexal mass. Other: No free air, free fluid, or intra-abdominal fluid collection. Moderate-sized fat containing umbilical hernia. Musculoskeletal: L4 compression deformity post vertebral plasty. Moderate L1 superior endplate compression deformity that appears chronic there is no evidence of focal bone lesion or acute osseous findings. IMPRESSION: 1. Mild gaseous distention of small bowel in the lower abdomen, up to 3.5 cm. Enteric contrast progresses distal to this to the level of the colon. There is mild colonic distension of the ascending, transverse and descending colon with air and stool. Findings are suggestive of ileus. 2. Abnormal endometrial distension and or fluid at 13 mm. Recommend further evaluation with pelvic ultrasound. 3. Moderate right and small left pleural effusion. Associated compressive atelectasis in the lung bases. 4. Moderate-sized fat containing umbilical hernia. Aortic Atherosclerosis (ICD10-I70.0). Electronically Signed   By: Narda Rutherford M.D.   On: 09/30/2022 22:32   DG CHEST PORT 1 VIEW  Result Date: 09/28/2022 CLINICAL DATA:  Dyspnea. EXAM: PORTABLE CHEST 1 VIEW COMPARISON:  Radiograph yesterday. FINDINGS: Stable cardiomegaly. Unchanged mediastinal contours. Aortic atherosclerosis. Bibasilar collapse/consolidation with pleural effusions, without significant interval change. No new airspace disease. No pneumothorax. No pulmonary edema IMPRESSION: 1. Unchanged radiographic appearance of the chest with bibasilar collapse/consolidation and pleural effusions. 2. Stable cardiomegaly. Electronically Signed   By: Narda Rutherford M.D.   On: 09/28/2022 15:20   VAS [place] UPPER EXTREMITY VENOUS DUPLEX  Result Date: 09/28/2022 UPPER VENOUS STUDY  Patient Name:  [name] [name]  Date of Exam:  09/27/2022  Medical Rec #: [id number]         Accession #:    [id number] Date of Birth: 1930/06/09        Patient Gender: F Patient Age:   7 years Exam Location[facility]  [facility] Procedure:      VAS [place] UPPER EXTREMITY VENOUS DUPLEX Referring Phys: Teressa Senter DESAI --------------------------------------------------------------------------------  Indications: Swelling Risk Factors: None identified. Limitations: Bandages and poor ultrasound/tissue interface. Comparison Study: No prior studies. Performing Technologist: Oliver Hum RVT  Examination Guidelines: A complete evaluation includes B-mode imaging, spectral Doppler, color Doppler, and power Doppler as needed of all accessible portions of each vessel. Bilateral testing is considered an integral part of a complete examination. Limited examinations for reoccurring indications may be performed as noted.  Right Findings: +----------+------------+---------+-----------+----------+-------+ RIGHT     CompressiblePhasicitySpontaneousPropertiesSummary +----------+------------+---------+-----------+----------+-------+ IJV           Full       Yes       Yes                      +----------+------------+---------+-----------+----------+-------+ Subclavian    Full       Yes       Yes                      +----------+------------+---------+-----------+----------+-------+ Axillary      Full       Yes       Yes                      +----------+------------+---------+-----------+----------+-------+ Brachial      Full       Yes       Yes                      +----------+------------+---------+-----------+----------+-------+ Radial        Full                                          +----------+------------+---------+-----------+----------+-------+ Ulnar         Full                                          +----------+------------+---------+-----------+----------+-------+ Cephalic      Full                                           +----------+------------+---------+-----------+----------+-------+ Basilic       None                                   Acute  +----------+------------+---------+-----------+----------+-------+ Thrombus located in the basilic vein extends from the distal forearm into the proximal forearm.  Left Findings: +----------+------------+---------+-----------+----------+-------+ LEFT      CompressiblePhasicitySpontaneousPropertiesSummary +----------+------------+---------+-----------+----------+-------+ Subclavian    Full       Yes       Yes                      +----------+------------+---------+-----------+----------+-------+  Summary:  Right: No evidence of deep vein thrombosis in the upper extremity. Findings consistent  with acute superficial vein thrombosis involving the right basilic vein.  Left: No evidence of thrombosis in the subclavian.  *See table(s) above for measurements and observations.  Diagnosing physician: Orlie Pollen Electronically signed by Orlie Pollen on 09/28/2022 at 1:32:25 PM.    Final    DG CHEST PORT 1 VIEW  Result Date: 09/27/2022 CLINICAL DATA:  Dyspnea. EXAM: PORTABLE CHEST 1 VIEW COMPARISON:  09/25/2022 FINDINGS: 0734 hours. The cardio pericardial silhouette is enlarged. Bibasilar collapse/consolidation with bilateral pleural effusions, similar to prior. Vascular congestion without overt airspace pulmonary edema. Telemetry leads overlie the chest. IMPRESSION: Bibasilar collapse/consolidation with bilateral pleural effusions, similar to prior. Electronically Signed   By: Misty Stanley M.D.   On: 09/27/2022 08:09   US Abdomen Limited  Result Date: 09/26/2022 CLINICAL DATA:  Elevated LFTs EXAM: ULTRASOUND ABDOMEN LIMITED RIGHT UPPER QUADRANT COMPARISON:  None Available. FINDINGS: Gallbladder: No gallstones or wall thickening visualized. No sonographic Murphy sign noted by sonographer. Common bile duct: Diameter: 6.3 mm Liver: No focal lesion identified. Within normal  limits in parenchymal echogenicity. Portal vein is patent on color Doppler imaging with normal direction of blood flow towards the liver. Other: None. IMPRESSION: 1. Normal sonographic appearance of the gallbladder. 2. Common bile duct is upper limits of normal in size. Electronically Signed   By: Yetta Glassman M.D.   On: 09/26/2022 09:20   DG CHEST PORT 1 VIEW  Result Date: 09/25/2022 CLINICAL DATA:  Dyspnea, weakness, history stroke, diabetes mellitus, hypertension EXAM: PORTABLE CHEST 1 VIEW COMPARISON:  Portable exam 0438 hours compared to 09/22/2022 FINDINGS: Enlargement of cardiac silhouette with pulmonary vascular congestion. Atherosclerotic calcification aorta. Bibasilar effusions and atelectasis. Upper lungs clear. No pneumothorax or acute osseous findings. IMPRESSION: Enlargement of cardiac silhouette with persistent bibasilar pleural effusions and atelectasis. Aortic Atherosclerosis (ICD10-I70.0). Electronically Signed   By: Lavonia Dana M.D.   On: 09/25/2022 08:14   ECHO TEE  Result Date: 09/24/2022    TRANSESOPHOGEAL ECHO REPORT   Patient Name:   [name] [name] Date of Exam: 09/24/2022 Medical Rec #:  [id number]        Height:       60.0 in Accession #:    [id number]       Weight:       158.5 lb Date of Birth:  June 26, 1930       BSA:          1.691 m Patient Age:    47 years         BP:           111/68 mmHg Patient Gender: F                HR:           118 bpm. Exam Location:  Inpatient Procedure: Transesophageal Echo, Cardiac Doppler and Color Doppler Indications:     atrial fibrillation  History:         Patient has prior history of Echocardiogram examinations, most                  recent 09/17/2022. CHF; Risk Factors:Hypertension, Dyslipidemia                  and Diabetes.  Sonographer:     Johny Chess RDCS Referring Phys:  [place] Diagnosing Phys: Dixie Dials MD PROCEDURE: After discussion of the risks and benefits of a TEE, an informed consent was obtained from the  patient. The transesophogeal probe was passed without difficulty  through the esophogus of the patient. Imaged were obtained with the patient in a left lateral decubitus position. Sedation performed by performing physician. Patients was under conscious sedation during this procedure., 2.5mg  of Versed. The patient developed no complications during the procedure. IMPRESSIONS  1. Left ventricular ejection fraction, by estimation, is 55 to 60%. The left ventricle has normal function. The left ventricle has no regional wall motion abnormalities. Left ventricular diastolic parameters are indeterminate.  2. Right ventricular systolic function is low normal. The right ventricular size is normal. Mildly increased right ventricular wall thickness.  3. Left atrial size was moderately dilated. No left atrial/left atrial appendage thrombus was detected.  4. Right atrial size was mildly dilated.  5. There is no evidence of cardiac tamponade.  6. The mitral valve is degenerative. Mild mitral valve regurgitation.  7. The aortic valve is tricuspid. There is mild calcification of the aortic valve. There is mild thickening of the aortic valve. Aortic valve regurgitation is mild.  8. Agitated saline contrast bubble study was negative, with no evidence of any interatrial shunt. FINDINGS  Left Ventricle: Left ventricular ejection fraction, by estimation, is 55 to 60%. The left ventricle has normal function. The left ventricle has no regional wall motion abnormalities. The left ventricular internal cavity size was normal in size. There is  borderline concentric left ventricular hypertrophy. Left ventricular diastolic parameters are indeterminate. Right Ventricle: The right ventricular size is normal. Mildly increased right ventricular wall thickness. Right ventricular systolic function is low normal. Left Atrium: Left atrial size was moderately dilated. No left atrial/left atrial appendage thrombus was detected. Right Atrium: Right atrial  size was mildly dilated. Pericardium: Trivial pericardial effusion is present. The pericardial effusion is circumferential. There is no evidence of cardiac tamponade. Mitral Valve: The mitral valve is degenerative in appearance. Mild mitral valve regurgitation. There is no evidence of mitral valve vegetation. Tricuspid Valve: The tricuspid valve is normal in structure. Tricuspid valve regurgitation is mild. There is no evidence of tricuspid valve vegetation. Aortic Valve: The aortic valve is tricuspid. There is mild calcification of the aortic valve. There is mild thickening of the aortic valve. There is mild aortic valve annular calcification. Aortic valve regurgitation is mild. There is no evidence of aortic valve vegetation. Pulmonic Valve: The pulmonic valve was normal in structure. Pulmonic valve regurgitation is trivial. There is no evidence of pulmonic valve vegetation. Aorta: The aortic root is normal in size and structure. Venous: The left upper pulmonary vein, left lower pulmonary vein, right upper pulmonary vein and right lower pulmonary vein are normal. The inferior vena cava was not well visualized. IAS/Shunts: No atrial level shunt detected by color flow Doppler. Agitated saline contrast was given intravenously to evaluate for intracardiac shunting. Agitated saline contrast bubble study was negative, with no evidence of any interatrial shunt. Dixie Dials MD Electronically signed by Dixie Dials MD Signature Date/Time: 09/24/2022/9:54:04 AM    Final    VAS [place] LOWER EXTREMITY VENOUS (DVT)  Result Date: 09/22/2022  Lower Venous DVT Study Patient Name:  [name] [name]  Date of Exam:   09/22/2022 Medical Rec #: [id number]         Accession #:    [id number] Date of Birth: [birth year]        Patient Gender: F Patient Age:   89 years Exam Location:  [facility] Procedure:      VAS [place] LOWER EXTREMITY VENOUS (DVT) Referring Phys: Freda Jackson  --------------------------------------------------------------------------------  Indications: Edema.  Limitations: Patient  condition, interruption for patient care and body habitus. Comparison Study: Prior negative bilateral LEV done 07/20/14. Prior negative left                   LEV done 10/09/16 Performing Technologist: Sharion Dove RVS  Examination Guidelines: A complete evaluation includes B-mode imaging, spectral Doppler, color Doppler, and power Doppler as needed of all accessible portions of each vessel. Bilateral testing is considered an integral part of a complete examination. Limited examinations for reoccurring indications may be performed as noted. The reflux portion of the exam is performed with the patient in reverse Trendelenburg.  +---------+---------------+---------+-----------+----------+--------------+ RIGHT    CompressibilityPhasicitySpontaneityPropertiesThrombus Aging +---------+---------------+---------+-----------+----------+--------------+ CFV      Full           Yes      No                                  +---------+---------------+---------+-----------+----------+--------------+ SFJ      Full                                                        +---------+---------------+---------+-----------+----------+--------------+ FV Prox  Full                                                        +---------+---------------+---------+-----------+----------+--------------+ FV Mid   Full                                                        +---------+---------------+---------+-----------+----------+--------------+ FV DistalFull                                                        +---------+---------------+---------+-----------+----------+--------------+ PFV      Full                                                        +---------+---------------+---------+-----------+----------+--------------+ POP      Full           Yes      No                                   +---------+---------------+---------+-----------+----------+--------------+ PTV      Full                                                        +---------+---------------+---------+-----------+----------+--------------+ PERO  Full                                                        +---------+---------------+---------+-----------+----------+--------------+   +---------+---------------+---------+-----------+----------+--------------+ LEFT     CompressibilityPhasicitySpontaneityPropertiesThrombus Aging +---------+---------------+---------+-----------+----------+--------------+ CFV      Full           Yes      Yes                                 +---------+---------------+---------+-----------+----------+--------------+ SFJ      Full                                                        +---------+---------------+---------+-----------+----------+--------------+ FV Prox  Full                                                        +---------+---------------+---------+-----------+----------+--------------+ FV Mid   Full                                                        +---------+---------------+---------+-----------+----------+--------------+ FV DistalFull                                                        +---------+---------------+---------+-----------+----------+--------------+ PFV      Full                                                        +---------+---------------+---------+-----------+----------+--------------+ POP      Full           No       Yes                                 +---------+---------------+---------+-----------+----------+--------------+ PTV      Full                                                        +---------+---------------+---------+-----------+----------+--------------+ PERO     Full                                                         +---------+---------------+---------+-----------+----------+--------------+  Summary: BILATERAL: - No evidence of deep vein thrombosis seen in the lower extremities, bilaterally. -No evidence of popliteal cyst, bilaterally.   *See table(s) above for measurements and observations. Electronically signed by Jamelle Haring on 09/22/2022 at 2:48:19 PM.    Final    CT HEAD WO CONTRAST (5MM)  Result Date: 09/22/2022 CLINICAL DATA:  Neurological deficit EXAM: CT HEAD WITHOUT CONTRAST TECHNIQUE: Contiguous axial images were obtained from the base of the skull through the vertex without intravenous contrast. RADIATION DOSE REDUCTION: This exam was performed according to the departmental dose-optimization program which includes automated exposure control, adjustment of the mA and/or kV according to patient size and/or use of iterative reconstruction technique. COMPARISON:  08/05/2014 FINDINGS: Brain: No acute findings are seen in noncontrast CT brain. There are no signs of bleeding within the cranium. Cortical sulci are prominent. There are small old lacunar infarcts in left basal ganglia. Vascular: Unremarkable. Skull: Unremarkable. Sinuses/Orbits: There is mucosal thickening in the ethmoid sinus. Other: None. IMPRESSION: No acute intracranial findings are seen in noncontrast CT brain. Atrophy. Small old lacunar infarcts are seen in basal ganglia. Mild chronic sinusitis. Electronically Signed   By: Elmer Picker M.D.   On: 09/22/2022 13:32   DG Chest Port 1V same Day  Result Date: 09/22/2022 CLINICAL DATA:  88 year old female with history of shortness of breath. EXAM: PORTABLE CHEST 1 VIEW COMPARISON:  Chest x-ray 09/20/2022. FINDINGS: Lung volumes are low. Bibasilar opacities which may reflect areas of atelectasis and/or consolidation. Moderate bilateral pleural effusions (right greater than left). No pneumothorax. Pulmonary vasculature does not appear engorged. Heart size is mildly enlarged. Upper  mediastinal contours are within normal limits. Atherosclerotic calcifications are noted within the thoracic aorta. IMPRESSION: 1. Moderate bilateral pleural effusions (right greater than left) with bibasilar areas of atelectasis and/or consolidation. 2. Aortic atherosclerosis. 3. Cardiomegaly. Electronically Signed   By: Vinnie Langton M.D.   On: 09/22/2022 10:54   DG Chest Port 1 View  Result Date: 09/20/2022 CLINICAL DATA:  Acute respiratory distress EXAM: PORTABLE CHEST 1 VIEW COMPARISON:  09/16/2022 FINDINGS: Mild bilateral interstitial thickening. Bilateral small pleural effusions. Bibasilar airspace disease which may reflect atelectasis versus pneumonia. No pneumothorax. Stable cardiomegaly. No acute osseous abnormality. IMPRESSION: 1. Findings concerning for CHF. 2. Bibasilar airspace disease which may reflect atelectasis versus pneumonia. Electronically Signed   By: Kathreen Devoid M.D.   On: 09/20/2022 08:21   ECHOCARDIOGRAM COMPLETE  Result Date: 09/17/2022    ECHOCARDIOGRAM REPORT   Patient Name:   [name] [name] Date of Exam: 09/17/2022 Medical Rec #:  [id number]        Height:       60.0 in Accession #:    [id number]       Weight:       150.0 lb Date of Birth:  Jun 16, 1930       BSA:          1.652 m Patient Age:    62 years         BP:           113/73 mmHg Patient Gender: F                HR:           104 bpm. Exam Location:  Inpatient Procedure: 2D Echo, Cardiac Doppler and Color Doppler Indications:     Atrial Fibrillation I48.91  History:         Patient has prior history of Echocardiogram examinations, most  recent 05/25/2018. CHF, Stroke, Arrythmias:Atrial Fibrillation;                  Risk Factors:Hypertension and Dyslipidemia.  Sonographer:     Ronny Flurry Referring Phys:  [id number] Courtney Paris Diagnosing Phys: Dixie Dials MD IMPRESSIONS  1. Left ventricular ejection fraction, by estimation, is 55 to 60%. The left ventricle has normal function. The left  ventricle has no regional wall motion abnormalities. Left ventricular diastolic parameters are indeterminate.  2. Right ventricular systolic function is low normal. The right ventricular size is normal.  3. Left atrial size was mildly dilated.  4. Right atrial size was mildly dilated.  5. The mitral valve is degenerative. Mild mitral valve regurgitation.  6. Tricuspid valve regurgitation is moderate.  7. The aortic valve is tricuspid. There is mild calcification of the aortic valve. There is mild thickening of the aortic valve. Aortic valve regurgitation is mild.  8. There is mild (Grade II) atheroma plaque involving the ascending aorta and aortic root.  9. The inferior vena cava is dilated in size with <50% respiratory variability, suggesting right atrial pressure of 15 mmHg. FINDINGS  Left Ventricle: Left ventricular ejection fraction, by estimation, is 55 to 60%. The left ventricle has normal function. The left ventricle has no regional wall motion abnormalities. The left ventricular internal cavity size was normal in size. There is  borderline left ventricular hypertrophy. Left ventricular diastolic parameters are indeterminate. Right Ventricle: The right ventricular size is normal. No increase in right ventricular wall thickness. Right ventricular systolic function is low normal. Left Atrium: Left atrial size was mildly dilated. Right Atrium: Right atrial size was mildly dilated. Pericardium: There is no evidence of pericardial effusion. Mitral Valve: The mitral valve is degenerative in appearance. There is mild thickening of the mitral valve leaflet(s). There is mild calcification of the mitral valve leaflet(s). Mild mitral annular calcification. Mild mitral valve regurgitation. Tricuspid Valve: The tricuspid valve is normal in structure. Tricuspid valve regurgitation is moderate. Aortic Valve: The aortic valve is tricuspid. There is mild calcification of the aortic valve. There is mild thickening of the  aortic valve. There is mild aortic valve annular calcification. Aortic valve regurgitation is mild. Aortic regurgitation PHT measures 657 msec. Aortic valve mean gradient measures 4.5 mmHg. Aortic valve peak gradient measures 8.5 mmHg. Aortic valve area, by VTI measures 1.93 cm. Pulmonic Valve: The pulmonic valve was normal in structure. Pulmonic valve regurgitation is trivial. Aorta: The aortic root is normal in size and structure. There is mild (Grade II) atheroma plaque involving the ascending aorta and aortic root. Venous: The inferior vena cava is dilated in size with less than 50% respiratory variability, suggesting right atrial pressure of 15 mmHg. IAS/Shunts: The atrial septum is grossly normal.  LEFT VENTRICLE PLAX 2D LVIDd:         3.90 cm   Diastology LVIDs:         2.80 cm   LV e' medial:   5.11 cm/s LV PW:         1.10 cm   LV E/e' medial: 21.5 LV IVS:        1.00 cm LVOT diam:     1.90 cm LV SV:         47 LV SV Index:   29 LVOT Area:     2.84 cm  RIGHT VENTRICLE            IVC RV S prime:     9.90 cm/s  IVC diam: 2.40 cm TAPSE (M-mode): 1.5 cm LEFT ATRIUM             Index        RIGHT ATRIUM           Index LA diam:        4.00 cm 2.42 cm/m   RA Area:     14.10 cm LA Vol (A2C):   48.1 ml 29.12 ml/m  RA Volume:   32.60 ml  19.74 ml/m LA Vol (A4C):   39.4 ml 23.85 ml/m LA Biplane Vol: 44.4 ml 26.88 ml/m  AORTIC VALVE AV Area (Vmax):    2.08 cm AV Area (Vmean):   2.18 cm AV Area (VTI):     1.93 cm AV Vmax:           146.00 cm/s AV Vmean:          98.800 cm/s AV VTI:            0.244 m AV Peak Grad:      8.5 mmHg AV Mean Grad:      4.5 mmHg LVOT Vmax:         107.10 cm/s LVOT Vmean:        75.800 cm/s LVOT VTI:          0.166 m LVOT/AV VTI ratio: 0.68 AI PHT:            657 msec  AORTA Ao Root diam: 3.10 cm Ao Asc diam:  3.70 cm MV E velocity: 110.00 cm/s  TRICUSPID VALVE                             TR Peak grad:   31.6 mmHg                             TR Vmax:        281.00 cm/s                               SHUNTS                             Systemic VTI:  0.17 m                             Systemic Diam: 1.90 cm Dixie Dials MD Electronically signed by Dixie Dials MD Signature Date/Time: 09/17/2022/12:20:08 PM    Final    DG HIP UNILAT WITH PELVIS 2-3 VIEWS LEFT  Result Date: 09/17/2022 CLINICAL DATA:  Hip pain. EXAM: DG HIP (WITH OR WITHOUT PELVIS) 2-3V LEFT COMPARISON:  None Available. FINDINGS: No evidence of acute fracture or joint dislocation. Osteopenia. Mild bilateral hip degenerative change and scattered enthesopathy. Lower lumbar degenerative change and lower lumbar kyphoplasty, partially imaged. IMPRESSION: No evidence of acute fracture or joint dislocation. Cross-sectional imaging could provide more sensitive evaluation if clinically warranted. Electronically Signed   By: Margaretha Sheffield M.D.   On: 09/17/2022 11:18   DG Lumbar Spine Complete  Result Date: 09/16/2022 CLINICAL DATA:  Pain.  Weakness. EXAM: LUMBAR SPINE - COMPLETE 4+ VIEW COMPARISON:  Lumbar spine radiographs 07/16/2019 FINDINGS: There are 5 non-rib-bearing lumbar-type vertebral bodies. There is 5 mm grade 1 anterolisthesis of L4 on L5, similar to prior. Mild dextrocurvature  centered at T9-10. Moderate anterior L1 vertebral body height loss, unchanged from 07/16/2019. Augmentation cement is again seen within the L4 vertebral body with unchanged mild L4 height loss. Moderate posterior L4-5 and L5-S1 and mild L1-2 through L3-4 disc space narrowing. Degenerative vacuum phenomenon at L1-2 through L4-5. Moderate to high-grade atherosclerotic calcifications. IMPRESSION: Compared to 07/16/2019: 1. Unchanged moderate anterior L1 vertebral body height loss, chronic. 2. Moderate L4-5 and mild L5-S1 degenerative disc and endplate changes. Electronically Signed   By: Yvonne Kendall M.D.   On: 09/16/2022 17:05   DG Chest 2 View  Result Date: 09/16/2022 CLINICAL DATA:  Cough EXAM: CHEST - 2 VIEW COMPARISON:  AP chest and  abdomen 11/13/2018, CT lumbar spine 05/17/2019 FINDINGS: Cardiac silhouette again appears moderately enlarged. Mediastinal contours are unchanged. Mild calcification within the aortic arch. Increased now mild-to-moderate right and unchanged tiny left pleural effusions. Right-greater-than-left lower lung interstitial thickening. No pneumothorax is seen. Moderate to severe level disc space narrowing and endplate osteophytes of the thoracic spine. Moderate anterior height loss of the L1 vertebral body is unchanged from 05/17/2019 lumbar spine CT and chronic. IMPRESSION: Compared to 11/13/2018: 1. Increased now mild-to-moderate right and unchanged tiny left pleural effusions. 2. Unchanged cardiomegaly. Electronically Signed   By: Yvonne Kendall M.D.   On: 09/16/2022 17:00   Discharge Medications:  Allergies as of 10/04/2022       Reactions   Tape Rash   Please do not use Plastic Tape        Medication List     STOP taking these medications    Accu-Chek Aviva Plus test strip Generic drug: glucose blood   Accu-Chek Softclix Lancets lancets   ALPRAZolam 0.25 MG tablet Commonly known as: XANAX   amLODipine 2.5 MG tablet Commonly known as: NORVASC   clopidogrel 75 MG tablet Commonly known as: PLAVIX   furosemide 20 MG tablet Commonly known as: LASIX   primidone 250 MG tablet Commonly known as: MYSOLINE       TAKE these medications    acetaminophen 325 MG tablet Commonly known as: Tylenol Take 2 tablets (650 mg total) by mouth every 6 (six) hours as needed. What changed:  how much to take reasons to take this   amiodarone 200 MG tablet Commonly known as: PACERONE Take 1 tablet (200 mg total) by mouth daily for 6 days, THEN 0.5 tablets (100 mg total) daily. Start taking on: October 04, 2022   amoxicillin 500 MG capsule Commonly known as: AMOXIL Take 1 capsule (500 mg total) by mouth every 12 (twelve) hours for 4 days.   atorvastatin 10 MG tablet Commonly known as:  LIPITOR Take 1 tablet (10 mg total) by mouth every evening. What changed:  medication strength how much to take how to take this   CALCIUM PO Take 1 tablet by mouth daily.   diclofenac Sodium 1 % Gel Commonly known as: VOLTAREN Apply 2 g topically 4 (four) times daily as needed.   Digoxin 62.5 MCG Tabs Take 1 tablet (0.0625 mg) by mouth daily.   diltiazem 30 MG tablet Commonly known as: CARDIZEM Take 1 tablet (30 mg total) by mouth every 8 (eight) hours.   DULoxetine 20 MG capsule Commonly known as: CYMBALTA TAKE 1 CAPSULE BY MOUTH EVERY DAY What changed: how much to take   FeroSul 325 (65 FE) MG tablet Generic drug: ferrous sulfate Take 1 tablet (325 mg total) by mouth daily.   gabapentin 100 MG capsule Commonly known as: NEURONTIN Take 1 capsule (  100 mg total) by mouth at bedtime. What changed: when to take this   isosorbide mononitrate 30 MG 24 hr tablet Commonly known as: IMDUR Take 15 mg by mouth daily.   lactose free nutrition Liqd Take 237 mLs by mouth 3 (three) times daily with meals.   levETIRAcetam 500 MG tablet Commonly known as: KEPPRA Take 1 tablet (500 mg total) by mouth 2 (two) times daily.   lidocaine 5 % Commonly known as: LIDODERM Place 1 patch onto the skin daily. Remove & Discard patch within 12 hours or as directed by MD   magnesium oxide 400 (240 Mg) MG tablet Commonly known as: MAG-OX Take 1 tablet (400 mg total) by mouth daily.   metoprolol succinate 50 MG 24 hr tablet Commonly known as: TOPROL-XL Take 1 tablet (50 mg total) by mouth every evening. Take with or immediately following a meal.   multivitamin with minerals Tabs tablet Take 1 tablet by mouth daily.   pantoprazole 40 MG tablet Commonly known as: PROTONIX Take 1 tablet (40 mg total) by mouth 2 (two) times daily.   Pazeo 0.7 % Soln Generic drug: Olopatadine HCl Place 1 drop into both eyes at bedtime.   polyethylene glycol 17 g packet Commonly known as: MIRALAX /  GLYCOLAX Take 17 g by mouth daily.   Restasis 0.05 % ophthalmic emulsion Generic drug: cycloSPORINE Place 1 drop into both eyes 2 (two) times daily.   senna 8.6 MG Tabs tablet Commonly known as: SENOKOT Take 1 tablet (8.6 mg total) by mouth daily as needed for mild constipation.   torsemide 20 MG tablet Commonly known as: DEMADEX Take 1 tablet (20 mg total) by mouth daily.   VITAMIN D PO Take 1 tablet by mouth daily.       Discharge Instructions: Please refer to Patient Instructions section of EMR for full details.  Patient was counseled important signs and symptoms that should prompt return to medical care, changes in medications, dietary instructions, activity restrictions, and follow up appointments.   Follow-Up Appointments:  Follow-up Information     Zola Button, MD. Go on 10/08/2022.   Specialty: Family Medicine Why: Appointment at 10:10 am, please arrive at least 15 minutes prior to your scheuled appointment time. Contact information: [place] [address] [phone]         Dixie Dials, MD. Schedule an appointment as soon as possible for a visit.   Specialty: Cardiology Contact information: [place] [address] [phone]                Alen Bleacher, MD 10/04/2022, 3:41 PM PGY-1, [place]

## 2022-10-05 DIAGNOSIS — R5381 Other malaise: Secondary | ICD-10-CM | POA: Diagnosis not present

## 2022-10-05 LAB — CBC
HCT: 29.9 % — ABNORMAL LOW (ref 36.0–46.0)
Hemoglobin: 9.5 g/dL — ABNORMAL LOW (ref 12.0–15.0)
MCH: 29.6 pg (ref 26.0–34.0)
MCHC: 31.8 g/dL (ref 30.0–36.0)
MCV: 93.1 fL (ref 80.0–100.0)
Platelets: 232 10*3/uL (ref 150–400)
RBC: 3.21 MIL/uL — ABNORMAL LOW (ref 3.87–5.11)
RDW: 14.6 % (ref 11.5–15.5)
WBC: 7.2 10*3/uL (ref 4.0–10.5)
nRBC: 0 % (ref 0.0–0.2)

## 2022-10-05 LAB — HEPARIN LEVEL (UNFRACTIONATED)
Heparin Unfractionated: 0.84 IU/mL — ABNORMAL HIGH (ref 0.30–0.70)
Heparin Unfractionated: 0.41 IU/mL (ref 0.30–0.70)

## 2022-10-05 NOTE — Evaluation (Signed)
Occupational Therapy Assessment and Plan  Patient Details  Name: [name] [name] MRN: [id number] Date of Birth: 1930/06/03  OT Diagnosis: acute pain, ataxia, and weakness, tremorsd Rehab Potential: Rehab Potential (ACUTE ONLY): Good ELOS: 2-2.5 weeks   Today's Date: 10/05/2022 OT Individual Time: 1607-3710 OT Individual Time Calculation (min): 20 min     Hospital Problem: Principal Problem:   Debility   Past Medical History:  Past Medical History:  Diagnosis Date   Anxiety    Diabetes ([place]) 10/27/2013   DVT (deep venous thrombosis) ([place])    Essential tremor 10/27/2013   High cholesterol 1999   Hypertension    Ischemic stroke ([place]) 07/18/2014   Obesity, unspecified 10/27/2013   Pneumonia    Unspecified hereditary and idiopathic peripheral neuropathy 10/27/2013   Past Surgical History:  Past Surgical History:  Procedure Laterality Date   BIOPSY  09/26/2022   Procedure: BIOPSY;  Surgeon: Jackquline Denmark, MD;  Location: [place];  Service: Gastroenterology;;   CATARACT EXTRACTION Bilateral    ESOPHAGOGASTRODUODENOSCOPY (EGD) WITH PROPOFOL N/A 09/26/2022   Procedure: ESOPHAGOGASTRODUODENOSCOPY (EGD) WITH PROPOFOL;  Surgeon: Jackquline Denmark, MD;  Location: [place];  Service: Gastroenterology;  Laterality: N/A;   FOOT SURGERY     IR KYPHO LUMBAR INC FX REDUCE BONE BX UNI/BIL CANNULATION INC/IMAGING  06/15/2019    Assessment & Plan Clinical Impression: [name] [name] is a 88 year old limited English speaking right-handed female with history of 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  Per chart review patient lives with her children.  1 level home one-step to entry.  Uses a rolling walker for mobility working with outpatient physical therapy.  She has an aide assisting 1 time a week for bathing.  Presented 09/16/2022 with generalized weakness x2 days as well as increased shortness of breath.  Chest x-ray showed increased mild to moderate right and unchanged tiny left pleural effusions.  Chemistries unremarkable except glucose 114, GFR 56, troponin negative, BNP 465.  EKG showed atrial fibrillation with RVR and placed on intravenous Cardizem.  Echocardiogram with ejection fraction of 55 to 60% no wall motion abnormalities.   Patient initially scheduled for cardioversion by cardiology services but subsequently developed acute respiratory distress requiring noninvasive imaging treated with aggressive diuresis.  Course complicated by upper GI bleed due to gastric ulcer with follow-up gastroenterology services undergoing upper GI endoscopy 09/26/2022 per Dr. Lyndel Safe and placed on PPI twice daily x12 weeks.  Noted bouts of confusion initial restlessness CT of the head completed showing no acute findings 09/22/2022.  Patient stabilized underwent TEE/DCCV 09/24/2022 per cardiology services and currently maintained on amiodarone and Toprol as well as Cardizem/Lanoxin.  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  Patient was cleared to begin intravenous heparin and transitioned to Eliquis.  IV diuresis transitioned to Demadex 20 mg p.o. daily.   Intermittent bouts of abdominal pain with CT of the abdomen 09/30/2022 showing mild gaseous distention of small bowel in the lower  abdomen up to 3.5 cm suggesting possible  ileus.  Her diet has been advanced to a mechanical soft.  She has had some elevated LFTs felt to be possibly related to amiodarone.  Hepatitis panel negative.  Urinalysis study 10/02/2022 with negative nitrite moderate leukocytosis with preliminary culture 80,000 gram-positive cocci and 30,000 gram-negative rod currently maintained on Omnicef transition to amoxicillin.  Therapy evaluations completed due to patient decreased functional mobility was admitted for a comprehensive rehab program.   Review of Systems  Constitutional:  Positive for malaise/fatigue. Negative for chills and fever.  HENT:  Negative for hearing loss.   Eyes:  Negative for double vision.  Respiratory:  Positive for shortness of breath. Negative for cough and wheezing.   Cardiovascular:  Positive for palpitations and leg swelling. Negative for chest pain.  Gastrointestinal:  Positive for abdominal pain, constipation and nausea. Negative for heartburn and vomiting.  Genitourinary:  Negative for dysuria, flank pain and hematuria.  Musculoskeletal:  Positive for back pain, joint pain and myalgias.  Skin:  Negative for rash.  Neurological:  Positive for tremors and seizures.  Psychiatric/Behavioral:         Anxiety  All other systems reviewed and are negative.  Patient transferred to CIR on 10/04/2022 .    Patient currently requires max with basic self-care skills secondary to muscle weakness, decreased cardiorespiratoy endurance, ataxia and decreased coordination, and decreased sitting balance, decreased standing balance, decreased postural control, and decreased balance strategies.  Prior to hospitalization, patient could complete dressing, toileting, and functional mobility independently.  Patient will benefit from skilled intervention to increase independence with basic self-care skills prior to discharge home with care partner.  Anticipate patient will require 24 hour supervision and follow up home health.  OT - End of  Session Activity Tolerance: Tolerates 10 - 20 min activity with multiple rests Endurance Deficit: Yes Endurance Deficit Description: Decreased OT Assessment Rehab Potential (ACUTE ONLY): Good OT Patient demonstrates impairments in the following area(s): Balance;Endurance;Motor OT Basic ADL's Functional Problem(s): Grooming;Bathing;Dressing;Toileting OT Advanced ADL's Functional Problem(s): Light Housekeeping;Simple Meal Preparation OT Transfers Functional Problem(s): Toilet;Tub/Shower OT Additional Impairment(s): None OT Plan OT Intensity: Minimum of 1-2 x/day, 45 to 90 minutes OT Frequency: 5 out of 7 days OT Duration/Estimated Length of Stay: 2-2.5 weeks OT Treatment/Interventions: Balance/vestibular training;DME/adaptive equipment instruction;Patient/family education;Therapeutic Activities;Therapeutic Exercise;Psychosocial support;Community reintegration;Functional mobility training;Self Care/advanced ADL retraining;UE/LE Strength taining/ROM;Discharge planning;Neuromuscular re-education;UE/LE Coordination activities;Pain management OT Self Feeding Anticipated Outcome(s): Mod I OT Basic Self-Care Anticipated Outcome(s): Supervision-dressing, Min A bathing OT Toileting Anticipated Outcome(s): Supervision OT Bathroom Transfers Anticipated Outcome(s): Supervision OT Recommendation Patient destination: Home Follow Up Recommendations: Home health OT Equipment Recommended: To be determined Equipment Details: pt has shower chair, w/c, RW   OT Evaluation Precautions/Restrictions  Precautions Precautions: Fall Precaution Comments: Watch O2 Restrictions Weight Bearing Restrictions: No General Chart Reviewed: Yes Family/Caregiver Present: Yes (son)  Home Living/Prior [place] expects to be discharged to:: Private residence Living Arrangements: Children Available Help at Discharge: Available 24 hours/day (son is trying to arrange 24/7 supervision on the  two days he works) Type of Home: House Home Access: Level entry [place]: One level Bathroom Shower/Tub: Gaffer (has shower chair, GB, HHS) Bathroom Toilet: Standard Bathroom Accessibility: Yes Additional Comments: Pt's son present and interpreting. Pt on supplamental O2 at home intermittently (only with SOB)  Lives With: Son IADL History Homemaking Responsibilities: No Current License: No Leisure and Hobbies: watches tv IADL Comments: son manages medications Prior Function Level of Independence: Independent with  transfers, Independent with gait, Needs assistance with ADLs, Needs assistance with homemaking Bath: Total Meal Prep: Total Laundry: Total Light Housekeeping: Total Vision Baseline Vision/History: 1 Wears glasses (when she reads) Ability to See in Adequate Light: 0 Adequate Patient Visual Report: No change from baseline Vision Assessment?: No apparent visual deficits Perception  Perception: Within Functional Limits Praxis Praxis: Intact Cognition Cognition Overall Cognitive Status: Within Functional Limits for tasks assessed Arousal/Alertness: Awake/alert Orientation Level: Person;Place;Situation Person: Oriented Place: Oriented Situation: Oriented Memory: Appears intact Awareness: Appears intact Problem Solving: Appears intact Safety/Judgment: Appears intact Brief Interview for Mental Status (BIMS) Repetition of Three Words (First Attempt): 3 Temporal Orientation: Year: Correct Temporal Orientation: Month: Missed by more than 1 month Temporal Orientation: Day: Incorrect Recall: "Sock": No, could not recall Recall: "Blue": No, could not recall Recall: "Bed": No, could not recall BIMS Summary Score: 6 Sensation Sensation Light Touch: Appears Intact Hot/Cold: Appears Intact Proprioception: Appears Intact Stereognosis: Appears Intact Coordination Gross Motor Movements are Fluid and Coordinated: No Fine Motor Movements are Fluid and  Coordinated: No Coordination and Movement Description: tremors Finger Nose Finger Test: poor secondary to tremors Motor  Motor Motor: Ataxia Motor - Skilled Clinical Observations: tremors  Trunk/Postural Assessment  Cervical Assessment Cervical Assessment: Exceptions to [facility] (forward head) Thoracic Assessment Thoracic Assessment: Exceptions to [facility] (rounded shoulders) Lumbar Assessment Lumbar Assessment: Exceptions to [facility] (posterior pelvic tilt) Postural Control Postural Control: Deficits on evaluation (tremors)  Balance Balance Balance Assessed: Yes Static Sitting Balance Static Sitting - Balance Support: Bilateral upper extremity supported Static Sitting - Level of Assistance: 4: Min assist (CGA) Dynamic Sitting Balance Dynamic Sitting - Balance Support: During functional activity Dynamic Sitting - Level of Assistance: 4: Min assist Dynamic Sitting - Balance Activities: Reaching for objects Static Standing Balance Static Standing - Balance Support: During functional activity;Bilateral upper extremity supported Static Standing - Level of Assistance: 3: Mod assist Dynamic Standing Balance Dynamic Standing - Balance Support: Bilateral upper extremity supported;During functional activity Dynamic Standing - Level of Assistance: 3: Mod assist Dynamic Standing - Balance Activities: Reaching for objects Extremity/Trunk Assessment RUE Assessment RUE Assessment: Within Functional Limits General Strength Comments: 3+/5 LUE Assessment LUE Assessment: Within Functional Limits General Strength Comments: 4-/5  Care Tool Care Tool Self Care Eating   Eating Assist Level: Contact Guard/Touching assist    Oral Care    Oral Care Assist Level: Contact Guard/Toucning assist    Bathing   Body parts bathed by patient: Right arm;Left arm;Chest;Abdomen;Right upper leg;Left upper leg;Face Body parts bathed by helper: Front perineal area;Buttocks;Right lower leg;Left lower leg   Assist  Level: Moderate Assistance - Patient 50 - 74%    Upper Body Dressing(including orthotics)   What is the patient wearing?: Pull over shirt   Assist Level: Minimal Assistance - Patient > 75%    Lower Body Dressing (excluding footwear)   What is the patient wearing?: Pants Assist for lower body dressing: Maximal Assistance - Patient 25 - 49%    Putting on/Taking off footwear   What is the patient wearing?: [place] for footwear: Maximal Assistance - Patient 25 - 49%       Care Tool Toileting Toileting activity   Assist for toileting: Total Assistance - Patient < 25%     Care Tool Bed Mobility Roll left and right activity   Roll left and right assist level: Minimal Assistance - Patient > 75%    Sit to lying activity   Sit to lying assist level: Minimal Assistance - Patient >  75%    Lying to sitting on side of bed activity   Lying to sitting on side of bed assist level: the ability to move from lying on the back to sitting on the side of the bed with no back support.: Moderate Assistance - Patient 50 - 74%     Care Tool Transfers Sit to stand transfer   Sit to stand assist level: Moderate Assistance - Patient 50 - 74%    Chair/bed transfer   Chair/bed transfer assist level: Moderate Assistance - Patient 50 - 74%     Toilet transfer   Assist Level: Moderate Assistance - Patient 50 - 74%     Care Tool Cognition  Expression of Ideas and Wants    Understanding Verbal and Non-Verbal Content     Memory/Recall Ability     Refer to Care Plan for Long Term Goals  SHORT TERM GOAL WEEK 1 OT Short Term Goal 1 (Week 1): Pt will be Min A for LB dressing OT Short Term Goal 2 (Week 1): Pt will be Mod A for toileting OT Short Term Goal 3 (Week 1): Pt will be Min A for functional transfers OT Short Term Goal 4 (Week 1): Pt will be Supervision for UB dressing  Recommendations for other services: None    Skilled Therapeutic Intervention ADL ADL Eating: Contact guard Where  Assessed-Eating: Bed level Grooming: Contact guard Where Assessed-Grooming: Bed level Upper Body Bathing: Setup Where Assessed-Upper Body Bathing: Edge of bed Lower Body Bathing: Maximal assistance Where Assessed-Lower Body Bathing: Edge of bed Upper Body Dressing: Minimal assistance Where Assessed-Upper Body Dressing: Edge of bed Lower Body Dressing: Maximal assistance Where Assessed-Lower Body Dressing: Edge of bed Toileting: Maximal assistance Where Assessed-Toileting: Bed level Toilet Transfer: Moderate assistance Toilet Transfer Method: Stand pivot Tub/Shower Transfer: Unable to assess Social research officer, government: Moderate assistance Social research officer, government Method: Radiographer, therapeutic: Transfer tub bench Mobility  Bed Mobility Bed Mobility: Supine to Sit;Sit to Supine Supine to Sit: Minimal Assistance - Patient > 75% Sit to Supine: Minimal Assistance - Patient > 75% Transfers Sit to Stand: Moderate Assistance - Patient 50-74% Stand to Sit: Moderate Assistance - Patient 50-74%  Upon OT arrival, pt semi recumbent in bed with son present at bedside. Pt agreeable to OT treatment and reports 6/10 pain in R LE. OT evaluation initiated, educated on role of OT, and discussed plan of care. Pt completes sponge bath ADL at the levels above. Pt limited primarily by decreased endurance, decreased strength, decreased balance, decreased activity tolerance, decreased ADL status and is functioning below her baseline. Pt would benefit from OT services to achieve highest level of independence.   Discharge Criteria: Patient will be discharged from OT if patient refuses treatment 3 consecutive times without medical reason, if treatment goals not met, if there is a change in medical status, if patient makes no progress towards goals or if patient is discharged from hospital.  The above assessment, treatment plan, treatment alternatives and goals were discussed and mutually agreed upon: by  patient  Marvetta Gibbons 10/05/2022, 9:08 AM

## 2022-10-05 NOTE — Evaluation (Signed)
Physical Therapy Assessment and Plan  Patient Details  Name: [name] [name] MRN: [id number] Date of Birth: 1930/07/11  PT Diagnosis: Abnormal posture, Abnormality of gait, Ataxia, Difficulty walking, Impaired sensation, Muscle weakness, and Pain in R LE. Rehab Potential: Good ELOS: 2-3 weeks   Today's Date: 10/05/2022 PT Individual Time: Evaluation: 1045-1200; 2nd Treatment Session: 1415-1500 PT Individual Time Calculation (min): 75 min; 45 min  Hospital Problem: Principal Problem:   Debility   Past Medical History:  Past Medical History:  Diagnosis Date   Anxiety    Diabetes (HCC) 10/27/2013   DVT (deep venous thrombosis) (HCC)    Essential tremor 10/27/2013   High cholesterol 1999   Hypertension    Ischemic stroke (HCC) 07/18/2014   Obesity, unspecified 10/27/2013   Pneumonia    Unspecified hereditary and idiopathic peripheral neuropathy 10/27/2013   Past Surgical History:  Past Surgical History:  Procedure Laterality Date   BIOPSY  09/26/2022   Procedure: BIOPSY;  Surgeon: Lynann Bologna, MD;  Location: [facility] ENDOSCOPY;  Service: Gastroenterology;;   CATARACT EXTRACTION Bilateral    ESOPHAGOGASTRODUODENOSCOPY (EGD) WITH PROPOFOL N/A 09/26/2022   Procedure: ESOPHAGOGASTRODUODENOSCOPY (EGD) WITH PROPOFOL;  Surgeon: Lynann Bologna, MD;  Location: [facility] ENDOSCOPY;  Service: Gastroenterology;  Laterality: N/A;   FOOT SURGERY     IR KYPHO LUMBAR INC FX REDUCE BONE BX UNI/BIL CANNULATION INC/IMAGING  06/15/2019    Assessment & Plan Clinical Impression: Patient is a 88 year old limited English speaking right-handed female with history of 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  Per chart review patient lives with her children.  1 level home one-step to entry.  Uses a rolling walker for mobility working with outpatient physical therapy.  She has an aide assisting 1 time a week for bathing.  Presented 09/16/2022 with generalized weakness x2 days as well as increased shortness of breath.  Chest x-ray showed increased mild to moderate right and unchanged tiny left pleural effusions.  Chemistries unremarkable except glucose 114, GFR 56, troponin negative, BNP 465.  EKG showed atrial fibrillation with RVR and placed on intravenous Cardizem.  Echocardiogram with ejection fraction of 55 to 60% no wall motion abnormalities.   Patient initially scheduled for cardioversion by cardiology services but subsequently developed acute respiratory distress requiring noninvasive imaging treated with aggressive diuresis.  Course complicated by upper GI bleed due to gastric ulcer with follow-up gastroenterology services undergoing upper GI endoscopy 09/26/2022 per Dr. Chales Abrahams and placed on PPI twice daily x12 weeks.  Noted bouts of confusion initial restlessness CT of the head completed showing no acute findings 09/22/2022.  Patient stabilized underwent TEE/DCCV 09/24/2022 per cardiology services and currently maintained on amiodarone and Toprol as well as Cardizem/Lanoxin.  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  Patient was cleared to begin intravenous heparin and transitioned to Eliquis.  IV diuresis transitioned to Demadex 20 mg p.o. daily.   Intermittent bouts of abdominal pain with CT of the abdomen 09/30/2022 showing  mild gaseous  distention of small bowel in the lower abdomen up to 3.5 cm suggesting possible ileus.  Her diet has been advanced to a mechanical soft.  She has had some elevated LFTs felt to be possibly related to amiodarone.  Hepatitis panel negative.  Urinalysis study 10/02/2022 with negative nitrite moderate leukocytosis with preliminary culture 80,000 gram-positive cocci and 30,000 gram-negative rod currently maintained on Omnicef transition to amoxicillin.  Therapy evaluations completed due to patient decreased functional mobility was admitted for a comprehensive rehab program.   Patient currently requires  Mod/MaxA x2  with mobility secondary to muscle weakness, decreased cardiorespiratoy endurance, impaired timing and sequencing and decreased coordination, increased fear of falling, tremors, and decreased sitting balance, decreased standing balance, decreased postural control, and decreased balance strategies.  Prior to hospitalization, patient was modified independent  with mobility and lived with Son in a House home.  Home access is 2 steps to enter main hall of home- Family is already planning on installing a rampStairs to enter.  Patient will benefit from skilled PT intervention to maximize safe functional mobility, minimize fall risk, and decrease caregiver burden for planned discharge home with 24 hour supervision.  Anticipate patient will benefit from follow up HH at discharge.  PT - End of Session Activity Tolerance: Tolerates 30+ min activity with multiple rests Endurance Deficit: Yes Endurance Deficit Description: Impaired endurance/activity tolerance PT Assessment Rehab Potential (ACUTE/IP ONLY): Good PT Barriers to Discharge: Home environment access/layout;Inaccessible home environment;Insurance for SNF coverage;Decreased caregiver support PT Patient demonstrates impairments in the following area(s): Balance;Safety;Sensory;Endurance;Motor;Skin Integrity;Pain PT Transfers Functional Problem(s): Bed  Mobility;Car;Bed to Chair PT Locomotion Functional Problem(s): Ambulation;Wheelchair Mobility;Stairs PT Plan PT Intensity: Minimum of 1-2 x/day ,45 to 90 minutes PT Frequency: 5 out of 7 days PT Duration Estimated Length of Stay: 2-3 weeks PT Treatment/Interventions: Ambulation/gait training;Community reintegration;DME/adaptive equipment instruction;Neuromuscular re-education;Psychosocial support;Stair training;UE/LE Strength taining/ROM;Wheelchair propulsion/positioning;Balance/vestibular training;Discharge planning;Functional electrical stimulation;Pain management;Skin care/wound management;Therapeutic Activities;UE/LE Coordination activities;Cognitive remediation/compensation;Disease management/prevention;Functional mobility training;Patient/family education;Splinting/orthotics;Therapeutic Exercise;Visual/perceptual remediation/compensation PT Transfers Anticipated Outcome(s): Supv with LRAD PT Locomotion Anticipated Outcome(s): Supv for household distances with LRAD PT Recommendation Follow Up Recommendations: Home health PT Patient destination: Home Equipment Recommended: To be determined Equipment Details: Patient owns a RW   PT Evaluation Precautions/Restrictions Precautions Precautions: Fall Precaution Comments: Monitor oxygen Restrictions Weight Bearing Restrictions: No Pain Pain Assessment Pain Scale: 0-10 Pain Score: 0-No pain Pain Interference Pain Interference Pain Effect on Sleep: 3. Frequently Pain Interference with Therapy Activities: 1. Rarely or not at all Pain Interference with Day-to-Day Activities: 2. Occasionally Home Living/Prior Functioning Home Living Available Help at Discharge: Available 24 hours/day;Family (Son reports they will need to have some help, however he would be available during the week. Prior to admission she had an aide for ~3 hours per week for bathing.) Type of Home: House Home Access: Stairs to enter [employer] of Steps: 2  steps to enter main hall of home- Family is already planning on installing a ramp Entrance Stairs-Rails: Can reach both Home Layout: One level Bathroom Shower/Tub: Walk-in shower (Has a small lip to step inside the shower- They already have grab bars installed) Bathroom Toilet: Standard Bathroom Accessibility: Yes Additional Comments: Patient's son reports they have oxygen at home, however she does not normally need it until recently prior to admission.  Lives With: Son Prior Function Level of Independence: Independent with transfers;Independent with gait  Able to Take Stairs?: Yes Driving: No Vocation: Other (Comment) (Son reports patient never worked) Leisure: Hobbies-yes (Comment) (Patient enjoys watching TV  and talking to various family members on her Ipad) Vision/Perception  Vision - History Ability to See in Adequate Light: 0 Adequate Perception Perception: Within Functional Limits Praxis Praxis: Intact  Cognition Overall Cognitive Status: Within Functional Limits for tasks assessed Arousal/Alertness: Awake/alert Orientation Level: Oriented X4 Year: 2024 Month: December Day of Week: Correct Memory: Appears intact Awareness: Appears intact Problem Solving: Appears intact Safety/Judgment: Appears intact Sensation Sensation Light Touch: Impaired Detail Peripheral sensation comments: Patient reports R LE sensation different compared to L LE with R LE being more dull. Light Touch Impaired Details: Impaired RLE Proprioception: Appears Intact Coordination Gross Motor Movements are Fluid and Coordinated: No Fine Motor Movements are Fluid and Coordinated: No Coordination and Movement Description: B UE tremors- Per son's report tremor has gotten worse since prior stroke in 2015 Motor  Motor Motor: Ataxia Motor - Skilled Clinical Observations: Tremor in R UE and at times her head   Trunk/Postural Assessment  Cervical Assessment Cervical Assessment: Exceptions to [facility]  (Forward head) Thoracic Assessment Thoracic Assessment: Exceptions to [facility] (Rounded shoulders) Lumbar Assessment Lumbar Assessment: Exceptions to [facility] (Posterior pelvic tilt) Postural Control Postural Control: Deficits on evaluation Righting Reactions: Delayed with tremors limiting stability  Balance Balance Balance Assessed: Yes Static Sitting Balance Static Sitting - Balance Support: Bilateral upper extremity supported Static Sitting - Level of Assistance: 5: Stand by assistance Dynamic Sitting Balance Dynamic Sitting - Balance Support: During functional activity;Feet supported Dynamic Sitting - Level of Assistance: 4: Min Oncologist Standing - Balance Support: Bilateral upper extremity supported;During functional activity Static Standing - Level of Assistance: 3: Mod assist Dynamic Standing Balance Dynamic Standing - Balance Support: Bilateral upper extremity supported;During functional activity Dynamic Standing - Level of Assistance: 1: +2 Total assist Extremity Assessment      RLE Assessment RLE Assessment: Exceptions to [facility] General Strength Comments: R LE assessed with functional mobility and grossly 3+/5 LLE Assessment LLE Assessment: Within Functional Limits General Strength Comments: L LE strength assessed with functional mobility and grossly 4/5  Care Tool Care Tool Bed Mobility Roll left and right activity   Roll left and right assist level: Minimal Assistance - Patient > 75%    Sit to lying activity   Sit to lying assist level: Minimal Assistance - Patient > 75%    Lying to sitting on side of bed activity   Lying to sitting on side of bed assist level: the ability to move from lying on the back to sitting on the side of the bed with no back support.: Minimal Assistance - Patient > 75%     Care Tool Transfers Sit to stand transfer   Sit to stand assist level: Moderate Assistance - Patient 50 - 74%    Chair/bed transfer   Chair/bed  transfer assist level: 2 Geologist, engineering transfer assist level: 2 helpers      Care Tool Locomotion Ambulation Ambulation activity did not occur: Safety/medical concerns (Unable to assess gait, stair mobility or picking up an item from the floor secondary to decreased strength, increased tremors, impaired endurance/activity tolerance and poor dynamic stability- Therapist to assess this afternoon if able.)    5' at hemi-rail with 2 person assist.    Walk 10 feet activity Walk 10 feet activity did not occur: Safety/medical concerns       Walk 50 feet with 2 turns activity Walk 50 feet with 2 turns activity  did not occur: Safety/medical concerns      Walk 150 feet activity Walk 150 feet activity did not occur: Safety/medical concerns      Walk 10 feet on uneven surfaces activity Walk 10 feet on uneven surfaces activity did not occur: Safety/medical concerns      Stairs Stair activity did not occur: Safety/medical concerns        Walk up/down 1 step activity Walk up/down 1 step or curb (drop down) activity did not occur: Safety/medical concerns      Walk up/down 4 steps activity Walk up/down 4 steps activity did not occur: Safety/medical concerns      Walk up/down 12 steps activity Walk up/down 12 steps activity did not occur: Safety/medical concerns      Pick up small objects from floor Pick up small object from the floor (from standing position) activity did not occur: Safety/medical concerns      Wheelchair Is the patient using a wheelchair?: Yes Type of Wheelchair: Manual   Wheelchair assist level: Total Assistance - Patient < 25% Max wheelchair distance: 10'- Therapist assisting with the remaining distance secondary to B UE tremors and poor endurance/activity tolerance.  Wheel 50 feet with 2 turns activity   Assist Level: Total Assistance - Patient < 25%  Wheel 150 feet activity   Assist Level: Total Assistance - Patient < 25%     Refer to Care Plan for Long Term Goals  SHORT TERM GOAL WEEK 1 PT Short Term Goal 1 (Week 1): Patient will performed sit/stand with LRAD and MinA PT Short Term Goal 2 (Week 1): Patient will perform bed/chair transfer with LRAD and ModA x1 PT Short Term Goal 3 (Week 1): Patient will ambulate 29' with LRAD and ModA x1  Recommendations for other services: None   Skilled Therapeutic Intervention Mobility Bed Mobility Bed Mobility: Rolling Right;Rolling Left;Supine to Sit;Sit to Supine Rolling Right: Minimal Assistance - Patient > 75% Rolling Left: Minimal Assistance - Patient > 75% Supine to Sit: Minimal Assistance - Patient > 75% Sit to Supine: Minimal Assistance - Patient > 75% Transfers Transfers: Sit to Stand;Stand to Sit;Stand Pivot Transfers Sit to Stand: Moderate Assistance - Patient 50-74% Stand to Sit: Moderate Assistance - Patient 50-74% Stand Pivot Transfers: 2 Helpers;Maximal Assistance - Patient 25 - 49% Stand Pivot Transfer Details: Visual cues/gestures for sequencing;Verbal cues for technique;Tactile cues for sequencing Transfer (Assistive device): Rolling walker (Junior RW) Locomotion  Gait Ambulation: No Gait Gait: No Stairs / Additional Locomotion Stairs: No Wheelchair Mobility Wheelchair Mobility: No  Skilled Intervention- Patient greeted supine in bed with two sons present and agreeable to PT treatment session. Patient presents with B UE tremors that are worse with intentional movements compared to at rest. Patient transitioned from supine to sitting EOB without the use of the bed rail and ModA- Therapist assisting with placing B LE off the and managing trunk. While sitting EOB, patient required ModA for scooting her hips closer to the edge. With B feet touching the floor, patient was able to sit EOB with SBA and no LOB noted. Patient performed stand pivot transfer from EOB to wheelchair with RW and MaxA with rehab tech present- Patient notably fearful  throughout transfer and prematurely sat on the wheelchair requiring total assist for ensure hips landed in the seat- Later in the treatment session therapist switched out standard wheelchair for hemi-height wheelchair in order to ensure proper positioning. Patient performed stand pivot transfer from wheelchair to car simulator with RW and ModA x2- Rehab tech  present to guide hips in case patient sits prematurely. Once seated, patient was able to place L LE into the car, however required MinA for R LE. Patient attempted to perform stand pivot transfer from car simulator to wheelchair with RW, however patient with increased tremors and reaching out with her L UE for various things to hold onto except for the walker. Therapist performed stand/squat pivot transfer to wheelchair with Mod/MaxA. Patient on RA throughout entire treatment session with SpO2 >94%. Patient returned to her room sitting upright in wheelchair with posey belt on, call bell within reach, sons present and all needs met.   2nd Treatment Session- Patient greeted supine in bed with sons present and agreeable to PT treatment session. Patient transitioned from semi-reclined to sitting EOB with use of bed rail and MinA. Patient required Min/ModA for further scooting hips toward EOB. Attempted to perform stand pivot transfer from EOB to wheelchair without AD, however upon initial stand patient with increased tremors and reaching out for various items around her and then requested to sit. Therapist demonstrated having her place her hand on therapist's forearm throughout transfer and patient agreeable. Eventually performed stand pivot transfer to wheelchair with Mod/MaxA and rehab tech present for guiding hips for safety. Patient wheeled to rehab gym for time management and energy conservation. Patient gait trained at the hemi-rail in the hallway x2', x5' with L UE support and R UE around therapist's shoulder while therapist sat on a stool and provided  assistance for lateral weight shifting as well as assisting with advancing and blocking R knee during swing and stance phase of gait. Patient required an extended seated rest break in between each gait trial with reports of significant fatigue. Patient propelled manual wheelchair x10' with B UE and MinA- Patient limited with wc mobility secondary to B UE tremors and poor endurance/activity tolerance. Patient returned to her room and performed Max/TotalA squat pivot transfer secondary to fatigue and inability to successfully perform stand pivot transfer. Once seated EOB, patient was able to transition to supine with Min/ModA for R LE and trunk management. Patient scooted toward [facility] with TotalA x2. Patient left supine in bed with bed alarm on, call bell within reach, sons present, ice pack on R forearm and all needs met.    Discharge Criteria: Patient will be discharged from PT if patient refuses treatment 3 consecutive times without medical reason, if treatment goals not met, if there is a change in medical status, if patient makes no progress towards goals or if patient is discharged from hospital.  The above assessment, treatment plan, treatment alternatives and goals were discussed and mutually agreed upon: by patient and by family  [place]  Plez Belton 10/05/2022, 12:33 PM

## 2022-10-05 NOTE — Progress Notes (Signed)
ANTICOAGULATION CONSULT NOTE  Pharmacy Consult for heparin Indication: pulmonary embolus and DVT  Allergies  Allergen Reactions   Tape Rash    Please do not use Plastic Tape    Patient Measurements:Height: 5' (152.4 cm) Weight: 75.6 kg (166 lb 10.7 oz) IBW/kg (Calculated) : 45.5  Heparin Dosing Weight: 63 kg  Vital Signs: Temp: 97.6 F (36.4 C) (11/11 1414) Temp Source: Oral (11/11 1414) BP: 125/50 (11/11 1414) Pulse Rate: 74 (11/11 1414)  Labs: Recent Labs    10/03/22 0135 10/04/22 0622 10/04/22 0622 10/04/22 0837 10/05/22 0521 10/05/22 0522 10/05/22 1808  HGB  --  9.2*  --   --  9.5*  --   --   HCT  --  28.2*  --   --  29.9*  --   --   PLT  --  170  --   --  232  --   --   HEPARINUNFRC  --  >1.10*   < > >1.10*  --  0.84* 0.41  CREATININE 0.75 0.75  --   --   --   --   --    < > = values in this interval not displayed.    Estimated Creatinine Clearance: 43.3 mL/min (by C-G formula based on SCr of 0.75 mg/dL).  Medical History: Past Medical History:  Diagnosis Date   Anxiety    Diabetes (HCC) 10/27/2013   DVT (deep venous thrombosis) (HCC)    Essential tremor 10/27/2013   High cholesterol 1999   Hypertension    Ischemic stroke (HCC) 07/18/2014   Obesity, unspecified 10/27/2013   Pneumonia    Unspecified hereditary and idiopathic peripheral neuropathy 10/27/2013    Assessment: 89 yof admitted with new onset Afib and volume overload. CTA chest found acute on chronic PE. LE doppler also shows right DVT. Hospital course complicated by previous GIB. Family in agreement to begin IV heparin. Pharmacy consulted to manage inpatient.   Hgb stable 9s, platelets are normal. No issues with heparin infusion or signs of bleeding reported. Heparin level remains supratherapeutic at 0.84 after decreasing rate to 700 units/hr. Per discussion with FM team, discussing [facility] plan with family before transitioning to apixaban. Family opting to continue on heparin infusion for now with  tentative plans to transition next week.   Heparin came back therapeutic tonight. We will check confirm level in AM.  Goal of Therapy:  Heparin level 0.3-0.5 units/ml d/t recent GIB Monitor platelets by anticoagulation protocol: Yes   Plan:  Heparin 700 units/hr Heparin level in AM Continue to monitor H&H and platelets    Ulyses Southward, PharmD, BCIDP, AAHIVP, CPP Infectious Disease Pharmacist 10/05/2022 7:06 PM

## 2022-10-05 NOTE — Plan of Care (Signed)
  Problem: Sit to Stand Goal: LTG:  Patient will perform sit to stand with assistance level (PT) Description: LTG:  Patient will perform sit to stand with assistance level (PT) Flowsheets (Taken 10/05/2022 1236) LTG: PT will perform sit to stand in preparation for functional mobility with assistance level: Supervision/Verbal cueing   Problem: RH Bed Mobility Goal: LTG Patient will perform bed mobility with assist (PT) Description: LTG: Patient will perform bed mobility with assistance, with/without cues (PT). Flowsheets (Taken 10/05/2022 1236) LTG: Pt will perform bed mobility with assistance level of: Independent with assistive device    Problem: RH Bed to Chair Transfers Goal: LTG Patient will perform bed/chair transfers w/assist (PT) Description: LTG: Patient will perform bed to chair transfers with assistance (PT). Flowsheets (Taken 10/05/2022 1236) LTG: Pt will perform Bed to Chair Transfers with assistance level: Supervision/Verbal cueing   Problem: RH Car Transfers Goal: LTG Patient will perform car transfers with assist (PT) Description: LTG: Patient will perform car transfers with assistance (PT). Flowsheets (Taken 10/05/2022 1236) LTG: Pt will perform car transfers with assist:: Supervision/Verbal cueing   Problem: RH Ambulation Goal: LTG Patient will ambulate in home environment (PT) Description: LTG: Patient will ambulate in home environment, # of feet with assistance (PT). Flowsheets (Taken 10/05/2022 1236) LTG: Pt will ambulate in home environ  assist needed:: Supervision/Verbal cueing LTG: Ambulation distance in home environment: 50'   Problem: RH Wheelchair Mobility Goal: LTG Patient will propel w/c in community environment (PT) Description: LTG: Patient will propel wheelchair in community environment, # of feet with assist (PT) Flowsheets (Taken 10/05/2022 1236) LTG: Pt will propel w/c in community environ  assist needed:: Independent with assistive  device Distance: wheelchair distance in controlled environment: 150

## 2022-10-05 NOTE — Progress Notes (Signed)
PROGRESS NOTE   Subjective/Complaints:  C/o pain in RLE , no trauma to area   ROS- neg CP, SOB,  N/V/D  Objective:   CT Angio Chest Pulmonary Embolism (PE) W or WO Contrast  Result Date: 10/03/2022 CLINICAL DATA:  DVT and prolonged immobilization. EXAM: CT ANGIOGRAPHY CHEST WITH CONTRAST TECHNIQUE: Multidetector CT imaging of the chest was performed using the standard protocol during bolus administration of intravenous contrast. Multiplanar CT image reconstructions and MIPs were obtained to evaluate the vascular anatomy. RADIATION DOSE REDUCTION: This exam was performed according to the departmental dose-optimization program which includes automated exposure control, adjustment of the mA and/or kV according to patient size and/or use of iterative reconstruction technique. CONTRAST:  78mL OMNIPAQUE IOHEXOL 350 MG/ML SOLN COMPARISON:  Chest radiograph 09/28/2022 FINDINGS: Despite efforts by the technologist and patient, motion artifact is present on today's exam and could not be eliminated. This reduces exam sensitivity and specificity. Cardiovascular: There is substantial primarily chronic pulmonary embolus with notable peripheral thrombus including in the right and left pulmonary arteries. Scattered areas of likely acute superimposed pulmonary embolus, for example in the left lower lobe on image 231 series 7 although also in the bilateral upper lobes. Right ventricular to left ventricular ratio is elevated at 1.3. Intervertebral calor septal thickness of 1.8 cm compatible with left ventricular hypertrophy. Mild mitral and aortic valve calcification. Coronary, aortic arch, and branch vessel atherosclerotic vascular disease. Mediastinum/Nodes: Probable wall thickening in the distal half of the thoracic esophagus. Reflux would be a common cause. Lungs/Pleura: Moderate right trace left pleural effusion with passive atelectasis. Airway thickening and  airway plugging in the left lower lobe. Nonspecific mild peripheral airspace opacity anteriorly in the left upper lobe measuring 1.4 by 0.9 cm on image 69 series 6. Upper Abdomen: Abdominal aortic atherosclerosis. There is atheromatous calcification proximally in the superior mesenteric artery. Musculoskeletal: L1 compression fracture as shown on 09/30/2022. Thoracic spondylosis with multilevel bridging spurring. Review of the MIP images confirms the above findings. IMPRESSION: 1. Acute on chronic pulmonary embolus with the bulk of the filling defect in the pulmonary arterial tree peripheral compatible with chronic embolus. Scattered areas of likely acute superimposed pulmonary embolus in the bilateral upper lobes and left lower lobe. Positive for acute PE with CT evidence of right heart strain (RV/LV Ratio = 1.3 ) consistent with at least submassive (intermediate risk) PE. The presence of right heart strain has been associated with an increased risk of morbidity and mortality. Please refer to the "Code PE Focused" order set in EPIC. 2. Moderate right and trace left pleural effusion with passive atelectasis. 3. Airway thickening and airway plugging in the left lower lobe. 4. There is some focal consolidation anteriorly in the left upper lobe which is nonspecific. However this could be from a small pulmonary infarct. 5. Left ventricular hypertrophy and cardiomegaly. 6. Coronary and aortic atherosclerosis along with atherosclerosis in the proximal superior mesenteric artery. Mild mitral and aortic valve calcification. 7. Chronic L1 compression fracture. Aortic Atherosclerosis (ICD10-I70.0). Critical Value/emergent results were called by telephone at the time of interpretation on 10/03/2022 at 7:06 pm to provider Dr. Sherrye Payor, who verbally acknowledged these results. Electronically Signed   By:  Van Clines M.D.   On: 10/03/2022 19:06   Recent Labs    10/04/22 0622 10/05/22 0521  WBC 7.3 7.2  HGB 9.2* 9.5*  HCT  28.2* 29.9*  PLT 170 232   Recent Labs    10/03/22 0135 10/04/22 0622  NA 135 131*  K 4.3 4.1  CL 91* 91*  CO2 35* 32  GLUCOSE 132* 131*  BUN 17 17  CREATININE 0.75 0.75  CALCIUM 8.6* 8.3*    Intake/Output Summary (Last 24 hours) at 10/05/2022 1050 Last data filed at 10/05/2022 0540 Gross per 24 hour  Intake 111.17 ml  Output --  Net 111.17 ml        Physical Exam: Vital Signs Blood pressure (!) 129/51, pulse 76, temperature 98.2 F (36.8 C), resp. rate 15, height 5' (1.524 m), weight 75.6 kg, SpO2 91 %.   General: No acute distress Mood and affect are appropriate Heart: Regular rate and rhythm no rubs murmurs or extra sounds Lungs: Clear to auscultation, breathing unlabored, no rales or wheezes Abdomen: Positive bowel sounds, soft nontender to palpation, nondistended Extremities: No clubbing, cyanosis, or edema Skin: No evidence of breakdown, no evidence of rash Neurologic: Cranial nerves II through XII intact, motor strength is 5/5 in left and 3-/5 right  deltoid, bicep, tricep, grip, hip flexor, knee extensors, ankle dorsiflexor and plantar flexor Sensory exam hypersensitive to touch RLE  Cerebellar exam- intention tremor RUE Musculoskeletal: Full range of motion in all 4 extremities. No joint swelling, no evidence of Right knee or ankle effusion no erythema    Assessment/Plan: 1. Functional deficits which require 3+ hours per day of interdisciplinary therapy in a comprehensive inpatient rehab setting. Physiatrist is providing close team supervision and 24 hour management of active medical problems listed below. Physiatrist and rehab team continue to assess barriers to discharge/monitor patient progress toward functional and medical goals  Care Tool:  Bathing    Body parts bathed by patient: Right arm, Left arm, Chest, Abdomen, Right upper leg, Left upper leg, Face   Body parts bathed by helper: Front perineal area, Buttocks, Right lower leg, Left lower  leg     Bathing assist Assist Level: Moderate Assistance - Patient 50 - 74%     Upper Body Dressing/Undressing Upper body dressing   What is the patient wearing?: Pull over shirt    Upper body assist Assist Level: Minimal Assistance - Patient > 75%    Lower Body Dressing/Undressing Lower body dressing      What is the patient wearing?: Pants     Lower body assist Assist for lower body dressing: Maximal Assistance - Patient 25 - 49%     Toileting Toileting    Toileting assist Assist for toileting: Total Assistance - Patient < 25%     Transfers Chair/bed transfer  Transfers assist     Chair/bed transfer assist level: Moderate Assistance - Patient 50 - 74%     Locomotion Ambulation   Ambulation assist              Walk 10 feet activity   Assist           Walk 50 feet activity   Assist           Walk 150 feet activity   Assist           Walk 10 feet on uneven surface  activity   Assist           Wheelchair     Assist  Wheelchair 50 feet with 2 turns activity    Assist            Wheelchair 150 feet activity     Assist          Blood pressure (!) 129/51, pulse 76, temperature 98.2 F (36.8 C), resp. rate 15, height 5' (1.524 m), weight 75.6 kg, SpO2 91 %.  Medical Problem List and Plan: 1. Functional deficits secondary to debility/multi medical including left thalamic posterior limb infarction 2015 receiving inpatient rehab services 07/21/2014 - 07/30/2014             -patient may shower             -ELOS/Goals: 10-14 days,  2.  Antithrombotics: -DVT/anticoagulation: Venous Doppler study 10/02/2022 findings consistent with age-indeterminate DVT right posterior tibial vein as well as CT angiogram of the chest showing acute on chronic pulmonary emboli.   - Intravenous heparin initiated 10/03/2022; plan to transition to Eliquis - held off 11/10 d/t ?supratherapeutic heparin gtt - follow  up with pharmacy regarding appropriate timing of transition - Per documentation will need serial [place] for RLE DVT on 11/13 and 11/22              -antiplatelet therapy: N/A   3. Pain Management: Neurontin 100 mg nightly, Cymbalta 20 mg daily, Voltaren gel as needed RLE pain suspect post CVA thalamic pain > small DVT , increase neurontin to BID  4. Mood/Behavior/Sleep: Provide emotional support             -antipsychotic agents: N/A 5. Neuropsych/cognition: This patient is capable of making decisions on her own behalf. 6. Skin/Wound Care: Routine skin checks 7. Fluids/Electrolytes/Nutrition: Routine in and outs with follow-up chemistries 8.  GI bleed.  Status post upper GI endoscopy 09/26/2022.  Continue PPI twice daily.              - Will need to discuss long-term AC risks and benefits with patient and family prior to discharge.   9.  Acute respiratory failure requiring noninvasive imaging treated with aggressive diuresis.  Check oxygen saturations every shift. Wean oxygen as tolerated.  10.  Acute on chronic diastolic congestive heart failure.  Demadex 20 mg daily.  Monitor for any signs of fluid overload.  Daily weights.               - Patient was using supplemental oxygen as needed prior to admission. 11.  New onset atrial fibrillation with RVR.  Amiodarone 200 mg daily.  Follow-up cardiology service Dr. Algie Coffer.  Status post DCCV 09/24/2022.  Continue Cardizem 30 mg every 8 hours, Lanoxin 0.0625 mg daily, Toprol 50 mg daily 12.  Elevated LFTs.  Felt to be possibly due to amiodarone.  Hepatitis panel negative follow-up labs. 13.  Seizure disorder.  Longstanding Keppra 500 mg twice daily 14.  Hyperlipidemia.  Lipitor 15.  Ileus.  Resolving.  Diet advanced to mechanical soft. LBM <24 hours per son.  16.  Obesity.  BMI 33.20.  Dietary follow-up 17.  History of type 2 diabetes mellitus, latest hemoglobin A1c 5.4.  18.  UTI.  80,000 gram-positive cocci/30,000 gram-negative rod.  Initially on  Omnicef transition to amoxicillin.  19. Hx essential tremor, severe. On longstanding primidone 250 mg BID, low-dose gabapentin, cymbalta, keppra.       - Per son, can perform ADLs including self feeding/grooming at home.       - Follows closely with OP neurology    LOS: 1 days A FACE TO  FACE EVALUATION WAS PERFORMED  Charlett Blake 10/05/2022, 10:50 AM

## 2022-10-05 NOTE — Plan of Care (Signed)
  Problem: RH Balance Goal: LTG: Patient will maintain dynamic sitting balance (OT) Description: LTG:  Patient will maintain dynamic sitting balance with assistance during activities of daily living (OT) Flowsheets (Taken 10/05/2022 1143) LTG: Pt will maintain dynamic sitting balance during ADLs with: Independent Goal: LTG Patient will maintain dynamic standing with ADLs (OT) Description: LTG:  Patient will maintain dynamic standing balance with assist during activities of daily living (OT)  Flowsheets (Taken 10/05/2022 1143) LTG: Pt will maintain dynamic standing balance during ADLs with: Supervision/Verbal cueing   Problem: Sit to Stand Goal: LTG:  Patient will perform sit to stand in prep for activites of daily living with assistance level (OT) Description: LTG:  Patient will perform sit to stand in prep for activites of daily living with assistance level (OT) Flowsheets (Taken 10/05/2022 1143) LTG: PT will perform sit to stand in prep for activites of daily living with assistance level: Supervision/Verbal cueing   Problem: RH Eating Goal: LTG Patient will perform eating w/assist, cues/equip (OT) Description: LTG: Patient will perform eating with assist, with/without cues using equipment (OT) Flowsheets (Taken 10/05/2022 1143) LTG: Pt will perform eating with assistance level of: Independent with assistive device  LTG: Pt will perform eating using equipment:  Weighted cup  Weighted utensil   Problem: RH Grooming Goal: LTG Patient will perform grooming w/assist,cues/equip (OT) Description: LTG: Patient will perform grooming with assist, with/without cues using equipment (OT) Flowsheets (Taken 10/05/2022 1143) LTG: Pt will perform grooming with assistance level of: Independent   Problem: RH Bathing Goal: LTG Patient will bathe all body parts with assist levels (OT) Description: LTG: Patient will bathe all body parts with assist levels (OT) Flowsheets (Taken 10/05/2022 1143) LTG:  Pt will perform bathing with assistance level/cueing: Minimal Assistance - Patient > 75% LTG: Position pt will perform bathing: Shower   Problem: RH Dressing Goal: LTG Patient will perform upper body dressing (OT) Description: LTG Patient will perform upper body dressing with assist, with/without cues (OT). Flowsheets (Taken 10/05/2022 1143) LTG: Pt will perform upper body dressing with assistance level of: Supervision/Verbal cueing Goal: LTG Patient will perform lower body dressing w/assist (OT) Description: LTG: Patient will perform lower body dressing with assist, with/without cues in positioning using equipment (OT) Flowsheets (Taken 10/05/2022 1143) LTG: Pt will perform lower body dressing with assistance level of: Supervision/Verbal cueing   Problem: RH Toileting Goal: LTG Patient will perform toileting task (3/3 steps) with assistance level (OT) Description: LTG: Patient will perform toileting task (3/3 steps) with assistance level (OT)  Flowsheets (Taken 10/05/2022 1143) LTG: Pt will perform toileting task (3/3 steps) with assistance level: Supervision/Verbal cueing   Problem: RH Toilet Transfers Goal: LTG Patient will perform toilet transfers w/assist (OT) Description: LTG: Patient will perform toilet transfers with assist, with/without cues using equipment (OT) Flowsheets (Taken 10/05/2022 1143) LTG: Pt will perform toilet transfers with assistance level of: Supervision/Verbal cueing   Problem: RH Tub/Shower Transfers Goal: LTG Patient will perform tub/shower transfers w/assist (OT) Description: LTG: Patient will perform tub/shower transfers with assist, with/without cues using equipment (OT) Flowsheets (Taken 10/05/2022 1143) LTG: Pt will perform tub/shower stall transfers with assistance level of: Supervision/Verbal cueing

## 2022-10-05 NOTE — Progress Notes (Signed)
ANTICOAGULATION CONSULT NOTE - Follow Up Consult  Pharmacy Consult for heparin Indication:  PE/DVT  Labs: Recent Labs    10/03/22 0135 10/04/22 0622 10/04/22 0837 10/05/22 0521 10/05/22 0522  HGB  --  9.2*  --  9.5*  --   HCT  --  28.2*  --  29.9*  --   PLT  --  170  --  232  --   HEPARINUNFRC  --  >1.10* >1.10*  --  0.84*  CREATININE 0.75 0.75  --   --   --     Assessment: 88yo female remains supratherapeutic on heparin after rate change; no infusion issues or signs of bleeding per RN.  Goal of Therapy:  Heparin level 0.3-0.5 units/ml   Plan:  Will decrease heparin infusion by 3 units/kg/hr to 700 units/hr and check level in 8 hours.    Colleen Can 10/05/2022,6:08 AM

## 2022-10-05 NOTE — Progress Notes (Addendum)
ANTICOAGULATION CONSULT NOTE  Pharmacy Consult for heparin Indication: pulmonary embolus and DVT  Allergies  Allergen Reactions   Tape Rash    Please do not use Plastic Tape    Patient Measurements:Height: 5' (152.4 cm) Weight: 75.6 kg (166 lb 10.7 oz) IBW/kg (Calculated) : 45.5  Heparin Dosing Weight: 63 kg  Vital Signs: Temp: 98.2 F (36.8 C) (11/11 0534) BP: 129/51 (11/11 0929) Pulse Rate: 76 (11/11 0929)  Labs: Recent Labs    10/03/22 0135 10/04/22 0622 10/04/22 0837 10/05/22 0521 10/05/22 0522  HGB  --  9.2*  --  9.5*  --   HCT  --  28.2*  --  29.9*  --   PLT  --  170  --  232  --   HEPARINUNFRC  --  >1.10* >1.10*  --  0.84*  CREATININE 0.75 0.75  --   --   --     Estimated Creatinine Clearance: 43.3 mL/min (by C-G formula based on SCr of 0.75 mg/dL).  Medical History: Past Medical History:  Diagnosis Date   Anxiety    Diabetes (HCC) 10/27/2013   DVT (deep venous thrombosis) (HCC)    Essential tremor 10/27/2013   High cholesterol 1999   Hypertension    Ischemic stroke (HCC) 07/18/2014   Obesity, unspecified 10/27/2013   Pneumonia    Unspecified hereditary and idiopathic peripheral neuropathy 10/27/2013    Assessment: 89 yof admitted with new onset Afib and volume overload. CTA chest found acute on chronic PE. LE doppler also shows right DVT. Hospital course complicated by previous GIB. Family in agreement to begin IV heparin. Pharmacy consulted to manage inpatient.   Hgb stable 9s, platelets are normal. No issues with heparin infusion or signs of bleeding reported. Heparin level remains supratherapeutic at 0.84 after decreasing rate to 700 units/hr. Per discussion with FM team, discussing [facility] plan with family before transitioning to apixaban. Family opting to continue on heparin infusion for now with tentative plans to transition next week.   Goal of Therapy:  Heparin level 0.3-0.5 units/ml d/t recent GIB Monitor platelets by anticoagulation protocol:  Yes   Plan:  Heparin 700 units/hr Heparin level pending  Continue to monitor H&H and platelets    Andreas Ohm, PharmD Pharmacy Resident  10/05/2022 3:24 PM

## 2022-10-05 NOTE — Plan of Care (Signed)
  Problem: Consults Goal: RH GENERAL PATIENT EDUCATION Description: See Patient Education module for education specifics. Outcome: Progressing   Problem: RH BOWEL ELIMINATION Goal: RH STG MANAGE BOWEL WITH ASSISTANCE Description: STG Manage Bowel with mod I Assistance. Outcome: Progressing Goal: RH STG MANAGE BOWEL W/MEDICATION W/ASSISTANCE Description: STG Manage Bowel with Medication with mod I  Assistance. Outcome: Progressing   Problem: RH BLADDER ELIMINATION Goal: RH STG MANAGE BLADDER WITH ASSISTANCE Description: STG Manage Bladder With toileting Assistance Outcome: Progressing   Problem: RH SAFETY Goal: RH STG ADHERE TO SAFETY PRECAUTIONS W/ASSISTANCE/DEVICE Description: STG Adhere to Safety Precautions With cues Assistance/Device. Outcome: Progressing   Problem: RH PAIN MANAGEMENT Goal: RH STG PAIN MANAGED AT OR BELOW PT'S PAIN GOAL Description: < 4 with prns Outcome: Progressing   Problem: RH KNOWLEDGE DEFICIT GENERAL Goal: RH STG INCREASE KNOWLEDGE OF SELF CARE AFTER HOSPITALIZATION Description: Patient and son will be able to manage care at discharge using educational handouts independently Outcome: Progressing   

## 2022-10-06 DIAGNOSIS — R5381 Other malaise: Secondary | ICD-10-CM | POA: Diagnosis not present

## 2022-10-06 LAB — CBC
HCT: 29.8 % — ABNORMAL LOW (ref 36.0–46.0)
Hemoglobin: 9.4 g/dL — ABNORMAL LOW (ref 12.0–15.0)
MCH: 29.6 pg (ref 26.0–34.0)
MCHC: 31.5 g/dL (ref 30.0–36.0)
MCV: 93.7 fL (ref 80.0–100.0)
Platelets: 209 10*3/uL (ref 150–400)
RBC: 3.18 MIL/uL — ABNORMAL LOW (ref 3.87–5.11)
RDW: 14.7 % (ref 11.5–15.5)
WBC: 7.7 10*3/uL (ref 4.0–10.5)
nRBC: 0 % (ref 0.0–0.2)

## 2022-10-06 LAB — HEPARIN LEVEL (UNFRACTIONATED)
Heparin Unfractionated: 0.31 IU/mL (ref 0.30–0.70)
Heparin Unfractionated: 0.24 IU/mL — ABNORMAL LOW (ref 0.30–0.70)

## 2022-10-06 LAB — GLUCOSE, CAPILLARY: Glucose-Capillary: 192 mg/dL — ABNORMAL HIGH (ref 70–99)

## 2022-10-06 NOTE — Discharge Instructions (Addendum)
Inpatient Rehab Discharge Instructions  [name] [name] Discharge date and time: No discharge date for patient encounter.   Activities/Precautions/ Functional Status: Activity: As tolerated Diet: Soft Wound Care: Routine skin checks Functional status:  ___ No restrictions     ___ Walk up steps independently ___ 24/7 supervision/assistance   ___ Walk up steps with assistance ___ Intermittent supervision/assistance  ___ Bathe/dress independently ___ Walk with walker     _x__ Bathe/dress with assistance ___ Walk Independently    ___ Shower independently ___ Walk with assistance    ___ Shower with assistance ___ No alcohol     ___ Return to work/school ________  COMMUNITY REFERRALS UPON DISCHARGE:    Home Health:   PT     OT       SNA                    Agency: [facility]  Phone: [phone]    Medical Equipment/Items Ordered: Vassie Moselle, Wheelchair, Fully [facility]                                                 Agency/Supplier: [employer] [phone]   Special Instructions:  No driving smoking or alcohol  My questions have been answered and I understand these instructions. I will adhere to these goals and the provided educational materials after my discharge from the hospital.  Patient/Caregiver Signature _______________________________ Date __________  Clinician Signature _______________________________________ Date __________  Please bring this form and your medication list with you to all your follow-up doctor's appointments.   ================================  Information on my medicine - ELIQUIS (apixaban)  This medication education was reviewed with me or my healthcare representative as part of my discharge preparation.     Why was Eliquis prescribed for you? Eliquis was prescribed to treat blood clots that may have been found in the veins of your legs (deep vein thrombosis) or in your lungs (pulmonary embolism) and to reduce the risk of them occurring  again.  What do You need to know about Eliquis ? The  dose is ONE 2.5 mg tablet taken TWICE daily.  Eliquis may be taken with or without food.   Try to take the dose about the same time in the morning and in the evening. If you have difficulty swallowing the tablet whole please discuss with your pharmacist how to take the medication safely.  Take Eliquis exactly as prescribed and DO NOT stop taking Eliquis without talking to the doctor who prescribed the medication.  Stopping may increase your risk of developing a new blood clot.  Refill your prescription before you run out.  After discharge, you should have regular check-up appointments with your healthcare provider that is prescribing your Eliquis.    What do you do if you miss a dose? If a dose of ELIQUIS is not taken at the scheduled time, take it as soon as possible on the same day and twice-daily administration should be resumed. The dose should not be doubled to make up for a missed dose.  Important Safety Information A possible side effect of Eliquis is bleeding. You should call your healthcare provider right away if you experience any of the following: Bleeding from an injury or your nose that does not stop. Unusual colored urine (red or dark brown) or unusual colored stools (red or black). Unusual bruising for  unknown reasons. A serious fall or if you hit your head (even if there is no bleeding).  Some medicines may interact with Eliquis and might increase your risk of bleeding or clotting while on Eliquis. To help avoid this, consult your healthcare provider or pharmacist prior to using any new prescription or non-prescription medications, including herbals, vitamins, non-steroidal anti-inflammatory drugs (NSAIDs) and supplements.  This website has more information on Eliquis (apixaban): http://www.eliquis.com/eliquis/home ========================================================  Pulmonary Embolism    A pulmonary  embolism (PE) is a sudden blockage or decrease of blood flow in one or both lungs. Most blockages come from a blood clot that forms in the vein of a lower leg, thigh, or arm (deep vein thrombosis, DVT) and travels to the lungs. A clot is blood that has thickened into a gel or solid. PE is a dangerous and life-threatening condition that needs to be treated right away.  What are the causes? This condition is usually caused by a blood clot that forms in a vein and moves to the lungs. In rare cases, it may be caused by air, fat, part of a tumor, or other tissue that moves through the veins and into the lungs.  What increases the risk? The following factors may make you more likely to develop this condition: Experiencing a traumatic injury, such as breaking a hip or leg. Having: A spinal cord injury. Orthopedic surgery, especially hip or knee replacement. Any major surgery. A stroke. DVT. Blood clots or blood clotting disease. Long-term (chronic) lung or heart disease. Cancer treated with chemotherapy. A central venous catheter. Taking medicines that contain estrogen. These include birth control pills and hormone replacement therapy. Being: Pregnant. In the period of time after your baby is delivered (postpartum). Older than age 80. Overweight. A smoker, especially if you have other risks.  What are the signs or symptoms? Symptoms of this condition usually start suddenly and include: Shortness of breath during activity or at rest. Coughing, coughing up blood, or coughing up blood-tinged mucus. Chest pain that is often worse with deep breaths. Rapid or irregular heartbeat. Feeling light-headed or dizzy. Fainting. Feeling anxious. Fever. Sweating. Pain and swelling in a leg. This is a symptom of DVT, which can lead to PE. How is this diagnosed? This condition may be diagnosed based on: Your medical history. A physical exam. Blood tests. CT pulmonary angiogram. This test checks  blood flow in and around your lungs. Ventilation-perfusion scan, also called a lung VQ scan. This test measures air flow and blood flow to the lungs. An ultrasound of the legs.  How is this treated? Treatment for this condition depends on many factors, such as the cause of your PE, your risk for bleeding or developing more clots, and other medical conditions you have. Treatment aims to remove, dissolve, or stop blood clots from forming or growing larger. Treatment may include: Medicines, such as: Blood thinning medicines (anticoagulants) to stop clots from forming. Medicines that dissolve clots (thrombolytics). Procedures, such as: Using a flexible tube to remove a blood clot (embolectomy) or to deliver medicine to destroy it (catheter-directed thrombolysis). Inserting a filter into a large vein that carries blood to the heart (inferior vena cava). This filter (vena cava filter) catches blood clots before they reach the lungs. Surgery to remove the clot (surgical embolectomy). This is rare. You may need a combination of immediate, long-term (up to 3 months after diagnosis), and extended (more than 3 months after diagnosis) treatments. Your treatment may continue for several months (maintenance therapy).  You and your health care provider will work together to choose the treatment program that is best for you.  Follow these instructions at home: Medicines Take over-the-counter and prescription medicines only as told by your health care provider. If you are taking an anticoagulant medicine: Take the medicine every day at the same time each day. Understand what foods and drugs interact with your medicine. Understand the side effects of this medicine, including excessive bruising or bleeding. Ask your health care provider or pharmacist about other side effects.  General instructions Wear a medical alert bracelet or carry a medical alert card that says you have had a PE and lists what medicines you  take. Ask your health care provider when you may return to your normal activities. Avoid sitting or lying for a long time without moving. Maintain a healthy weight. Ask your health care provider what weight is healthy for you. Do not use any products that contain nicotine or tobacco, such as cigarettes, e-cigarettes, and chewing tobacco. If you need help quitting, ask your health care provider. Talk with your health care provider about any travel plans. It is important to make sure that you are still able to take your medicine while on trips. Keep all follow-up visits as told by your health care provider. This is important.  Contact a health care provider if: You missed a dose of your blood thinner medicine.  Get help right away if: You have: New or increased pain, swelling, warmth, or redness in an arm or leg. Numbness or tingling in an arm or leg. Shortness of breath during activity or at rest. A fever. Chest pain. A rapid or irregular heartbeat. A severe headache. Vision changes. A serious fall or accident, or you hit your head. Stomach (abdominal) pain. Blood in your vomit, stool, or urine. A cut that will not stop bleeding. You cough up blood. You feel light-headed or dizzy. You cannot move your arms or legs. You are confused or have memory loss.  These symptoms may represent a serious problem that is an emergency. Do not wait to see if the symptoms will go away. Get medical help right away. Call your local emergency services (911 in the [place] Do not drive yourself to the hospital. Summary A pulmonary embolism (PE) is a sudden blockage or decrease of blood flow in one or both lungs. PE is a dangerous and life-threatening condition that needs to be treated right away. Treatments for this condition usually include medicines to thin your blood (anticoagulants) or medicines to break apart blood clots (thrombolytics). If you are given blood thinners, it is important to take the  medicine every day at the same time each day. Understand what foods and drugs interact with any medicines that you are taking. If you have signs of PE or DVT, call your local emergency services (911 in the [place] This information is not intended to replace advice given to you by your health care provider. Make sure you discuss any questions you have with your health care provider. Document Revised: 08/19/2018 Document Reviewed: 08/19/2018 Elsevier Patient Education  2020 [employer]    ======================================================================== Atrial Fibrillation    Atrial fibrillation is a type of heartbeat that is irregular or fast. If you have this condition, your heart beats without any order. This makes it hard for your heart to pump blood in a normal way. Atrial fibrillation may come and go, or it may become a long-lasting problem. If this condition is not treated, it can put  you at higher risk for stroke, heart failure, and other heart problems.  What are the causes? This condition may be caused by diseases that damage the heart. They include: High blood pressure. Heart failure. Heart valve disease. Heart surgery. Other causes include: Diabetes. Thyroid disease. Being overweight. Kidney disease. Sometimes the cause is not known.  What increases the risk? You are more likely to develop this condition if: You are older. You smoke. You exercise often and very hard. You have a family history of this condition. You are a man. You use drugs. You drink a lot of alcohol. You have lung conditions, such as emphysema, pneumonia, or COPD. You have sleep apnea.  What are the signs or symptoms? Common symptoms of this condition include: A feeling that your heart is beating very fast. Chest pain or discomfort. Feeling short of breath. Suddenly feeling light-headed or weak. Getting tired easily during activity. Fainting. Sweating. In some cases, there are no  symptoms.  How is this treated? Treatment for this condition depends on underlying conditions and how you feel when you have atrial fibrillation. They include: Medicines to: Prevent blood clots. Treat heart rate or heart rhythm problems. Using devices, such as a pacemaker, to correct heart rhythm problems. Doing surgery to remove the part of the heart that sends bad signals. Closing an area where clots can form in the heart (left atrial appendage). In some cases, your doctor will treat other underlying conditions.  Follow these instructions at home:  Medicines Take over-the-counter and prescription medicines only as told by your doctor. Do not take any new medicines without first talking to your doctor. If you are taking blood thinners: Talk with your doctor before you take any medicines that have aspirin or NSAIDs, such as ibuprofen, in them. Take your medicine exactly as told by your doctor. Take it at the same time each day. Avoid activities that could hurt or bruise you. Follow instructions about how to prevent falls. Wear a bracelet that says you are taking blood thinners. Or, carry a card that lists what medicines you take. Lifestyle         Do not use any products that have nicotine or tobacco in them. These include cigarettes, e-cigarettes, and chewing tobacco. If you need help quitting, ask your doctor. Eat heart-healthy foods. Talk with your doctor about the right eating plan for you. Exercise regularly as told by your doctor. Do not drink alcohol. Lose weight if you are overweight. Do not use drugs, including cannabis.  General instructions If you have a condition that causes breathing to stop for a short period of time (apnea), treat it as told by your doctor. Keep a healthy weight. Do not use diet pills unless your doctor says they are safe for you. Diet pills may make heart problems worse. Keep all follow-up visits as told by your doctor. This is  important.  Contact a doctor if: You notice a change in the speed, rhythm, or strength of your heartbeat. You are taking a blood-thinning medicine and you get more bruising. You get tired more easily when you move or exercise. You have a sudden change in weight.  Get help right away if:    You have pain in your chest or your belly (abdomen). You have trouble breathing. You have side effects of blood thinners, such as blood in your vomit, poop (stool), or pee (urine), or bleeding that cannot stop. You have any signs of a stroke. "BE FAST" is an easy way  to remember the main warning signs: B - Balance. Signs are dizziness, sudden trouble walking, or loss of balance. E - Eyes. Signs are trouble seeing or a change in how you see. F - Face. Signs are sudden weakness or loss of feeling in the face, or the face or eyelid drooping on one side. A - Arms. Signs are weakness or loss of feeling in an arm. This happens suddenly and usually on one side of the body. S - Speech. Signs are sudden trouble speaking, slurred speech, or trouble understanding what people say. T - Time. Time to call emergency services. Write down what time symptoms started. You have other signs of a stroke, such as: A sudden, very bad headache with no known cause. Feeling like you may vomit (nausea). Vomiting. A seizure.  These symptoms may be an emergency. Do not wait to see if the symptoms will go away. Get medical help right away. Call your local emergency services (911 in the [place] Do not drive yourself to the hospital. Summary Atrial fibrillation is a type of heartbeat that is irregular or fast. You are at higher risk of this condition if you smoke, are older, have diabetes, or are overweight. Follow your doctor's instructions about medicines, diet, exercise, and follow-up visits. Get help right away if you have signs or symptoms of a stroke. Get help right away if you cannot catch your breath, or you have chest pain  or discomfort. This information is not intended to replace advice given to you by your health care provider. Make sure you discuss any questions you have with your health care provider. Document Revised: 05/05/2019 Document Reviewed: 05/05/2019 Elsevier Patient Education  [employer].

## 2022-10-06 NOTE — Progress Notes (Signed)
ANTICOAGULATION CONSULT NOTE  Pharmacy Consult for heparin Indication: pulmonary embolus and DVT  Allergies  Allergen Reactions   Tape Rash    Please do not use Plastic Tape    Patient Measurements:Height: 5' (152.4 cm) Weight: 75.6 kg (166 lb 10.7 oz) IBW/kg (Calculated) : 45.5  Heparin Dosing Weight: 63 kg  Vital Signs: Temp: 98.1 F (36.7 C) (11/12 0334) Temp Source: Oral (11/12 0334) BP: 111/49 (11/12 0334) Pulse Rate: 64 (11/12 0334)  Labs: Recent Labs    10/04/22 0622 10/04/22 0837 10/05/22 0521 10/05/22 0522 10/05/22 1808 10/06/22 0644  HGB 9.2*  --  9.5*  --   --  9.4*  HCT 28.2*  --  29.9*  --   --  29.8*  PLT 170  --  232  --   --  209  HEPARINUNFRC >1.10*   < >  --  0.84* 0.41 0.31  CREATININE 0.75  --   --   --   --   --    < > = values in this interval not displayed.    Estimated Creatinine Clearance: 43.3 mL/min (by C-G formula based on SCr of 0.75 mg/dL).  Medical History: Past Medical History:  Diagnosis Date   Anxiety    Diabetes (HCC) 10/27/2013   DVT (deep venous thrombosis) (HCC)    Essential tremor 10/27/2013   High cholesterol 1999   Hypertension    Ischemic stroke (HCC) 07/18/2014   Obesity, unspecified 10/27/2013   Pneumonia    Unspecified hereditary and idiopathic peripheral neuropathy 10/27/2013    Assessment: 89 yof admitted with new onset Afib and volume overload. CTA chest found acute on chronic PE. LE doppler also shows right DVT. Hospital course complicated by previous GIB. Family in agreement to begin IV heparin. Pharmacy consulted to manage inpatient.   Hgb stable 9s, platelets are normal. Per discussion with FM team, discussing [facility] plan with family before transitioning to apixaban. Family opting to continue on heparin infusion for now with tentative plans to transition next week.   Heparin level lower end of therapeutic range this AM at 0.31 with fairly significant drop from level of 0.41 yesterday evening. Hgb/plt stable.   No issues with infusion or bleeding per RN.  Goal of Therapy:  Heparin level 0.3-0.5 units/ml d/t recent GIB Monitor platelets by anticoagulation protocol: Yes   Plan:  Increase heparin to 750 units/hr Check 8 hr heparin level Continue to monitor H&H and platelets F/u plans to transition to Eliquis    Andreas Ohm, PharmD Pharmacy Resident  10/06/2022 8:24 AM

## 2022-10-06 NOTE — Progress Notes (Signed)
PROGRESS NOTE   Subjective/Complaints:  No pain c/os Pt has purple DNR wrist band from acute, but no order, son at bedside and he can translate Farsi, per mom  ROS- neg CP, SOB,  N/V/D  Objective:   No results found. Recent Labs    10/05/22 0521 10/06/22 0644  WBC 7.2 7.7  HGB 9.5* 9.4*  HCT 29.9* 29.8*  PLT 232 209    Recent Labs    10/04/22 0622  NA 131*  K 4.1  CL 91*  CO2 32  GLUCOSE 131*  BUN 17  CREATININE 0.75  CALCIUM 8.3*     Intake/Output Summary (Last 24 hours) at 10/06/2022 [id number] Last data filed at 10/06/2022 0759 Gross per 24 hour  Intake 706.29 ml  Output --  Net 706.29 ml         Physical Exam: Vital Signs Blood pressure (!) 111/49, pulse 64, temperature 98.1 F (36.7 C), temperature source Oral, resp. rate 15, height 5' (1.524 m), weight 75.6 kg, SpO2 91 %.   General: No acute distress Mood and affect are appropriate Heart: Regular rate and rhythm no rubs murmurs or extra sounds Lungs: Clear to auscultation, breathing unlabored, no rales or wheezes Abdomen: Positive bowel sounds, soft nontender to palpation, nondistended Extremities: No clubbing, cyanosis, or edema Skin: No evidence of breakdown, no evidence of rash Neurologic: Cranial nerves II through XII intact, motor strength is 5/5 in left and 3-/5 right  deltoid, bicep, tricep, grip, hip flexor, knee extensors, ankle dorsiflexor and plantar flexor Sensory exam hypersensitive to touch RLE  Cerebellar exam- intention tremor RUE Musculoskeletal: Full range of motion in all 4 extremities. No joint swelling, no evidence of Right knee or ankle effusion no erythema    Assessment/Plan: 1. Functional deficits which require 3+ hours per day of interdisciplinary therapy in a comprehensive inpatient rehab setting. Physiatrist is providing close team supervision and 24 hour management of active medical problems listed  below. Physiatrist and rehab team continue to assess barriers to discharge/monitor patient progress toward functional and medical goals  Care Tool:  Bathing    Body parts bathed by patient: Right arm, Left arm, Chest, Abdomen, Right upper leg, Left upper leg, Face   Body parts bathed by helper: Front perineal area, Buttocks, Right lower leg, Left lower leg     Bathing assist Assist Level: Moderate Assistance - Patient 50 - 74%     Upper Body Dressing/Undressing Upper body dressing   What is the patient wearing?: Pull over shirt    Upper body assist Assist Level: Minimal Assistance - Patient > 75%    Lower Body Dressing/Undressing Lower body dressing      What is the patient wearing?: Pants     Lower body assist Assist for lower body dressing: Maximal Assistance - Patient 25 - 49%     Toileting Toileting    Toileting assist Assist for toileting: Total Assistance - Patient < 25%     Transfers Chair/bed transfer  Transfers assist     Chair/bed transfer assist level: 2 Helpers     Locomotion Ambulation   Ambulation assist   Ambulation activity did not occur: Safety/medical concerns (Unable to assess gait,  stair mobility or picking up an item from the floor secondary to decreased strength, increased tremors, impaired endurance/activity tolerance and poor dynamic stability- Therapist to assess this afternoon if able.)  Assist level: 2 helpers Assistive device: Other (comment) (Hemi-rail) Max distance: 5'   Walk 10 feet activity   Assist  Walk 10 feet activity did not occur: Safety/medical concerns        Walk 50 feet activity   Assist Walk 50 feet with 2 turns activity did not occur: Safety/medical concerns         Walk 150 feet activity   Assist Walk 150 feet activity did not occur: Safety/medical concerns         Walk 10 feet on uneven surface  activity   Assist Walk 10 feet on uneven surfaces activity did not occur: Safety/medical  concerns         Wheelchair     Assist Is the patient using a wheelchair?: Yes Type of Wheelchair: Manual    Wheelchair assist level: Total Assistance - Patient < 25% Max wheelchair distance: 10'- Therapist assisting with the remaining distance secondary to B UE tremors and poor endurance/activity tolerance.    Wheelchair 50 feet with 2 turns activity    Assist        Assist Level: Total Assistance - Patient < 25%   Wheelchair 150 feet activity     Assist      Assist Level: Total Assistance - Patient < 25%   Blood pressure (!) 111/49, pulse 64, temperature 98.1 F (36.7 C), temperature source Oral, resp. rate 15, height 5' (1.524 m), weight 75.6 kg, SpO2 91 %.  Medical Problem List and Plan: 1. Functional deficits secondary to debility/multi medical including left thalamic posterior limb infarction 2015 receiving inpatient rehab services 07/21/2014 - 07/30/2014             -patient may shower             -ELOS/Goals: 10-14 days, discussed DNR with son, has purple wrist band , son translates at bedside indicate mom wants full measure, he will also discuss with his sister today  2.  Antithrombotics: -DVT/anticoagulation: Venous Doppler study 10/02/2022 findings consistent with age-indeterminate DVT right posterior tibial vein as well as CT angiogram of the chest showing acute on chronic pulmonary emboli.   - Intravenous heparin initiated 10/03/2022; plan to transition to Eliquis -Dr Doylene Canard plans to coordinate with GI in terms of timing the resumption of Eliquis  - Per documentation will need serial [place] for RLE DVT on 11/13 and 11/22              -antiplatelet therapy: N/A   3. Pain Management: Neurontin 100 mg nightly, Cymbalta 20 mg daily, Voltaren gel as needed RLE pain suspect post CVA thalamic pain > small DVT , increase neurontin to BID  4. Mood/Behavior/Sleep: Provide emotional support             -antipsychotic agents: N/A 5. Neuropsych/cognition: This  patient is capable of making decisions on her own behalf. 6. Skin/Wound Care: Routine skin checks 7. Fluids/Electrolytes/Nutrition: Routine in and outs with follow-up chemistries 8.  GI bleed.  Status post upper GI endoscopy 09/26/2022.  Continue PPI twice daily.              - Will need to discuss long-term AC risks and benefits with patient and family prior to discharge.   9.  Acute respiratory failure requiring noninvasive imaging treated with aggressive diuresis.  Check oxygen  saturations every shift. Wean oxygen as tolerated.  10.  Acute on chronic diastolic congestive heart failure.  Demadex 20 mg daily.  Monitor for any signs of fluid overload.  Daily weights.               - Patient was using supplemental oxygen as needed prior to admission. 11.  New onset atrial fibrillation with RVR.  Amiodarone 200 mg daily.  Follow-up cardiology service Dr. Algie Coffer.  Status post DCCV 09/24/2022.  Continue Cardizem 30 mg every 8 hours, Lanoxin 0.0625 mg daily, Toprol 50 mg daily 12.  Elevated LFTs.  Felt to be possibly due to amiodarone.  Hepatitis panel negative follow-up labs. 13.  Seizure disorder.  Longstanding Keppra 500 mg twice daily 14.  Hyperlipidemia.  Lipitor 15.  Ileus.  Resolving.  Diet advanced to mechanical soft. LBM <24 hours per son.  16.  Obesity.  BMI 33.20.  Dietary follow-up 17.  History of type 2 diabetes mellitus, latest hemoglobin A1c 5.4.  18.  UTI.  80,000 gram-positive cocci/30,000 gram-negative rod.  Initially on Omnicef transition to amoxicillin.  19. Hx essential tremor, severe. On longstanding primidone 250 mg BID, low-dose gabapentin, cymbalta, keppra.       - Per son, can perform ADLs including self feeding/grooming at home.       - Follows closely with OP neurology    LOS: 2 days A FACE TO FACE EVALUATION WAS PERFORMED  Erick Colace 10/06/2022, 8:08 AM

## 2022-10-06 NOTE — Plan of Care (Signed)
  Problem: Consults Goal: RH GENERAL PATIENT EDUCATION Description: See Patient Education module for education specifics. Outcome: Progressing   Problem: RH BOWEL ELIMINATION Goal: RH STG MANAGE BOWEL WITH ASSISTANCE Description: STG Manage Bowel with mod I Assistance. Outcome: Progressing Goal: RH STG MANAGE BOWEL W/MEDICATION W/ASSISTANCE Description: STG Manage Bowel with Medication with mod I  Assistance. Outcome: Progressing   Problem: RH BLADDER ELIMINATION Goal: RH STG MANAGE BLADDER WITH ASSISTANCE Description: STG Manage Bladder With toileting Assistance Outcome: Progressing   Problem: RH SAFETY Goal: RH STG ADHERE TO SAFETY PRECAUTIONS W/ASSISTANCE/DEVICE Description: STG Adhere to Safety Precautions With cues Assistance/Device. Outcome: Progressing   Problem: RH PAIN MANAGEMENT Goal: RH STG PAIN MANAGED AT OR BELOW PT'S PAIN GOAL Description: < 4 with prns Outcome: Progressing   Problem: RH KNOWLEDGE DEFICIT GENERAL Goal: RH STG INCREASE KNOWLEDGE OF SELF CARE AFTER HOSPITALIZATION Description: Patient and son will be able to manage care at discharge using educational handouts independently Outcome: Progressing   

## 2022-10-06 NOTE — Consult Note (Signed)
Ref: Zola Button, MD   Subjective:  Awake. No chest pain or palpitation. No GI or GU bleed. Postpone warfarin use per GI for 2 weeks from endoscopy.  Objective:  Vital Signs in the last 24 hours: Temp:  [97.9 F (36.6 C)-98.1 F (36.7 C)] 97.9 F (36.6 C) (11/12 1433) Pulse Rate:  [63-72] 72 (11/12 1433) Resp:  [15-17] 17 (11/12 1433) BP: (111-138)/(49-52) 124/49 (11/12 1433) SpO2:  [91 %-95 %] 95 % (11/12 1433)  Physical Exam: BP Readings from Last 1 Encounters:  10/06/22 (!) 124/49     Wt Readings from Last 1 Encounters:  10/05/22 75.6 kg    Weight change:  Body mass index is 32.55 kg/m. HEENT: [place]/AT, Eyes-Brown, Conjunctiva-Pale, Sclera-Non-icteric Neck: No JVD, No bruit, Trachea midline. Lungs:  Clear, Bilateral. Cardiac:  Regular rhythm, normal S1 and S2, no S3. II/VI systolic murmur. Abdomen:  Soft, non-tender. BS present. Extremities:  No edema present. No cyanosis. No clubbing. CNS: AxOx3, Cranial nerves grossly intact, moves all 4 extremities.  Skin: Warm and dry.   Intake/Output from previous day: 11/11 0701 - 11/12 0700 In: 426.3 [P.O.:360; I.V.:66.3] Out: -     Lab Results: BMET    Component Value Date/Time   NA 131 (L) 10/04/2022 0622   NA 135 10/03/2022 0135   NA 134 (L) 10/02/2022 0148   NA 141 06/14/2022 1505   NA 141 04/26/2020 1708   NA 141 06/22/2018 1448   K 4.1 10/04/2022 0622   K 4.3 10/03/2022 0135   K 4.4 10/02/2022 0148   CL 91 (L) 10/04/2022 0622   CL 91 (L) 10/03/2022 0135   CL 93 (L) 10/02/2022 0148   CO2 32 10/04/2022 0622   CO2 35 (H) 10/03/2022 0135   CO2 33 (H) 10/02/2022 0148   GLUCOSE 131 (H) 10/04/2022 0622   GLUCOSE 132 (H) 10/03/2022 0135   GLUCOSE 112 (H) 10/02/2022 0148   BUN 17 10/04/2022 0622   BUN 17 10/03/2022 0135   BUN 18 10/02/2022 0148   BUN 17 06/14/2022 1505   BUN 19 04/26/2020 1708   BUN 14 06/22/2018 1448   CREATININE 0.75 10/04/2022 0622   CREATININE 0.75 10/03/2022 0135   CREATININE 0.66  10/02/2022 0148   CALCIUM 8.3 (L) 10/04/2022 0622   CALCIUM 8.6 (L) 10/03/2022 0135   CALCIUM 8.5 (L) 10/02/2022 0148   GFRNONAA >60 10/04/2022 0622   GFRNONAA >60 10/03/2022 0135   GFRNONAA >60 10/02/2022 0148   GFRAA 69 04/26/2020 1708   GFRAA >60 06/15/2019 0841   GFRAA >60 11/13/2018 1941   CBC    Component Value Date/Time   WBC 7.7 10/06/2022 0644   RBC 3.18 (L) 10/06/2022 0644   HGB 9.4 (L) 10/06/2022 0644   HGB 15.2 04/26/2020 1708   HCT 29.8 (L) 10/06/2022 0644   HCT 46.5 04/26/2020 1708   PLT 209 10/06/2022 0644   PLT 102 (L) 04/26/2020 1708   MCV 93.7 10/06/2022 0644   MCV 94 04/26/2020 1708   MCH 29.6 10/06/2022 0644   MCHC 31.5 10/06/2022 0644   RDW 14.7 10/06/2022 0644   RDW 12.8 04/26/2020 1708   LYMPHSABS 1.5 10/01/2022 0644   LYMPHSABS 1.6 04/26/2020 1708   MONOABS 0.8 10/01/2022 0644   EOSABS 0.2 10/01/2022 0644   EOSABS 0.0 04/26/2020 1708   BASOSABS 0.1 10/01/2022 0644   BASOSABS 0.0 04/26/2020 1708   HEPATIC Function Panel Recent Labs    10/02/22 0148 10/03/22 0135 10/04/22 0622  PROT 5.8* 5.8* 5.9*  ALBUMIN 2.6* 2.7* 2.7*  AST 149* 101* 93*  ALT 197* 160* 146*  ALKPHOS 95 88 101   HEMOGLOBIN A1C Lab Results  Component Value Date   MPG 126 (H) 07/18/2014   CARDIAC ENZYMES Lab Results  Component Value Date   TROPONINI <0.30 03/24/2012   BNP No results for input(s): "PROBNP" in the last 8760 hours. TSH Recent Labs    09/16/22 1929  TSH 1.792   CHOLESTEROL Recent Labs    06/14/22 1505 09/17/22 0207  CHOL 180 161    Scheduled Meds:  amiodarone  200 mg Oral Daily   amoxicillin  500 mg Oral Q12H   atorvastatin  10 mg Oral QPM   cycloSPORINE  1 drop Both Eyes BID   digoxin  0.0625 mg Oral Daily   diltiazem  30 mg Oral Q8H   DULoxetine  20 mg Oral Daily   gabapentin  100 mg Oral QHS   lactose free nutrition  237 mL Oral TID WC   levETIRAcetam  500 mg Oral BID   magnesium oxide  400 mg Oral Daily   metoprolol succinate   50 mg Oral QPM   multivitamin with minerals  1 tablet Oral Daily   pantoprazole  40 mg Oral BID   polyethylene glycol  17 g Oral Daily   potassium chloride  10 mEq Oral BID   torsemide  20 mg Oral Daily   Continuous Infusions:  heparin 800 Units/hr (10/06/22 1739)   PRN Meds:.acetaminophen, diclofenac Sodium, guaiFENesin, senna, simethicone  Assessment/Plan: Paroxysmal atrial fibrillation Acute on chronic diastolic left heart failure, stable Acute respiratory failure with hypoxia, stable Acute right lower extremity DVT Abdominal pain Bilateral hip pain S/P Colonic Ileus Recent GI bleed Gastric ulcer Anemia of blood loss HTN HLD Old stroke Arthritis Parkinson's disease  Plan: Continue IV heparin. Postpone warfarin use for 1 more week. Increase activity as tolerated.   LOS: 2 days   Time spent including chart review, lab review, examination, discussion with patient :  min   Orpah Cobb  MD  10/06/2022, 8:15 PM

## 2022-10-06 NOTE — Consult Note (Signed)
Ref: [name], MD   Subjective:  Awake. Had started activity with PT. VS stable. No GI bleed.  Objective:  Vital Signs in the last 24 hours: Temp:  [97.9 F (36.6 C)-98.1 F (36.7 C)] 97.9 F (36.6 C) (11/12 1433) Pulse Rate:  [63-72] 72 (11/12 1433) Resp:  [15-17] 17 (11/12 1433) BP: (111-138)/(49-52) 124/49 (11/12 1433) SpO2:  [91 %-95 %] 95 % (11/12 1433)  Physical Exam: BP Readings from Last 1 Encounters:  10/06/22 (!) 124/49     Wt Readings from Last 1 Encounters:  10/05/22 75.6 kg    Weight change:  Body mass index is 32.55 kg/m. HEENT: [place]/AT, Eyes-Brown, Conjunctiva-Pale, Sclera-Non-icteric Neck: No JVD, No bruit, Trachea midline. Lungs:  Clear, Bilateral. Cardiac:  Regular rhythm, normal S1 and S2, no S3. II/VI systolic murmur. Abdomen:  Soft, non-tender. BS present. Extremities:  No edema present. No cyanosis. No clubbing. CNS: AxOx3, Cranial nerves grossly intact, moves all 4 extremities.  Skin: Warm and dry.   Intake/Output from previous day: 11/11 0701 - 11/12 0700 In: 426.3 [P.O.:360; I.V.:66.3] Out: -     Lab Results: BMET    Component Value Date/Time   NA 131 (L) 10/04/2022 0622   NA 135 10/03/2022 0135   NA 134 (L) 10/02/2022 0148   NA 141 06/14/2022 1505   NA 141 04/26/2020 1708   NA 141 06/22/2018 1448   K 4.1 10/04/2022 0622   K 4.3 10/03/2022 0135   K 4.4 10/02/2022 0148   CL 91 (L) 10/04/2022 0622   CL 91 (L) 10/03/2022 0135   CL 93 (L) 10/02/2022 0148   CO2 32 10/04/2022 0622   CO2 35 (H) 10/03/2022 0135   CO2 33 (H) 10/02/2022 0148   GLUCOSE 131 (H) 10/04/2022 0622   GLUCOSE 132 (H) 10/03/2022 0135   GLUCOSE 112 (H) 10/02/2022 0148   BUN 17 10/04/2022 0622   BUN 17 10/03/2022 0135   BUN 18 10/02/2022 0148   BUN 17 06/14/2022 1505   BUN 19 04/26/2020 1708   BUN 14 06/22/2018 1448   CREATININE 0.75 10/04/2022 0622   CREATININE 0.75 10/03/2022 0135   CREATININE 0.66 10/02/2022 0148   CALCIUM 8.3 (L) 10/04/2022 0622    CALCIUM 8.6 (L) 10/03/2022 0135   CALCIUM 8.5 (L) 10/02/2022 0148   GFRNONAA >60 10/04/2022 0622   GFRNONAA >60 10/03/2022 0135   GFRNONAA >60 10/02/2022 0148   GFRAA 69 04/26/2020 1708   GFRAA >60 06/15/2019 0841   GFRAA >60 11/13/2018 1941   CBC    Component Value Date/Time   WBC 7.7 10/06/2022 0644   RBC 3.18 (L) 10/06/2022 0644   HGB 9.4 (L) 10/06/2022 0644   HGB 15.2 04/26/2020 1708   HCT 29.8 (L) 10/06/2022 0644   HCT 46.5 04/26/2020 1708   PLT 209 10/06/2022 0644   PLT 102 (L) 04/26/2020 1708   MCV 93.7 10/06/2022 0644   MCV 94 04/26/2020 1708   MCH 29.6 10/06/2022 0644   MCHC 31.5 10/06/2022 0644   RDW 14.7 10/06/2022 0644   RDW 12.8 04/26/2020 1708   LYMPHSABS 1.5 10/01/2022 0644   LYMPHSABS 1.6 04/26/2020 1708   MONOABS 0.8 10/01/2022 0644   EOSABS 0.2 10/01/2022 0644   EOSABS 0.0 04/26/2020 1708   BASOSABS 0.1 10/01/2022 0644   BASOSABS 0.0 04/26/2020 1708   HEPATIC Function Panel Recent Labs    10/02/22 0148 10/03/22 0135 10/04/22 0622  PROT 5.8* 5.8* 5.9*  ALBUMIN 2.6* 2.7* 2.7*  AST 149* 101* 93*  ALT 197* 160* 146*  ALKPHOS 95 88 101   HEMOGLOBIN A1C Lab Results  Component Value Date   MPG 126 (H) 07/18/2014   CARDIAC ENZYMES Lab Results  Component Value Date   TROPONINI <0.30 03/24/2012   BNP No results for input(s): "PROBNP" in the last 8760 hours. TSH Recent Labs    09/16/22 1929  TSH 1.792   CHOLESTEROL Recent Labs    06/14/22 1505 09/17/22 0207  CHOL 180 161    Scheduled Meds:  amiodarone  200 mg Oral Daily   amoxicillin  500 mg Oral Q12H   atorvastatin  10 mg Oral QPM   cycloSPORINE  1 drop Both Eyes BID   digoxin  0.0625 mg Oral Daily   diltiazem  30 mg Oral Q8H   DULoxetine  20 mg Oral Daily   gabapentin  100 mg Oral QHS   lactose free nutrition  237 mL Oral TID WC   levETIRAcetam  500 mg Oral BID   magnesium oxide  400 mg Oral Daily   metoprolol succinate  50 mg Oral QPM   multivitamin with minerals  1 tablet  Oral Daily   pantoprazole  40 mg Oral BID   polyethylene glycol  17 g Oral Daily   potassium chloride  10 mEq Oral BID   torsemide  20 mg Oral Daily   Continuous Infusions:  heparin 800 Units/hr (10/06/22 1739)   PRN Meds:.acetaminophen, diclofenac Sodium, guaiFENesin, senna, simethicone  Assessment/Plan:   Paroxysmal atrial fibrillation Acute on chronic diastolic left heart failure,stable Acute respiratory failure with hypoxia, stable Acute right lower extremity DVT Abdominal pain Colonic Ileus Bilateral hip pain Recent GI bleed Gastric ulcer Anemia of blood loss HTN HLD Old stroke Arthritis Parkinson's disease  Plan: Continue IV heparin. Increase activity per rehab.   LOS: 2 days   Time spent including chart review, lab review, examination, discussion with patient :  min   Dixie Dials  MD  10/06/2022, 8:05 PM

## 2022-10-06 NOTE — Progress Notes (Signed)
ANTICOAGULATION CONSULT NOTE  Pharmacy Consult for heparin Indication: pulmonary embolus and DVT  Allergies  Allergen Reactions   Tape Rash    Please do not use Plastic Tape    Patient Measurements:Height: 5' (152.4 cm) Weight: 75.6 kg (166 lb 10.7 oz) IBW/kg (Calculated) : 45.5  Heparin Dosing Weight: 63 kg  Vital Signs: Temp: 97.9 F (36.6 C) (11/12 1433) BP: 124/49 (11/12 1433) Pulse Rate: 72 (11/12 1433)  Labs: Recent Labs    10/04/22 0622 10/04/22 0837 10/05/22 0521 10/05/22 0522 10/05/22 1808 10/06/22 0644 10/06/22 1507  HGB 9.2*  --  9.5*  --   --  9.4*  --   HCT 28.2*  --  29.9*  --   --  29.8*  --   PLT 170  --  232  --   --  209  --   HEPARINUNFRC >1.10*   < >  --    < > 0.41 0.31 0.24*  CREATININE 0.75  --   --   --   --   --   --    < > = values in this interval not displayed.    Estimated Creatinine Clearance: 43.3 mL/min (by C-G formula based on SCr of 0.75 mg/dL).  Medical History: Past Medical History:  Diagnosis Date   Anxiety    Diabetes (HCC) 10/27/2013   DVT (deep venous thrombosis) (HCC)    Essential tremor 10/27/2013   High cholesterol 1999   Hypertension    Ischemic stroke (HCC) 07/18/2014   Obesity, unspecified 10/27/2013   Pneumonia    Unspecified hereditary and idiopathic peripheral neuropathy 10/27/2013    Assessment: 89 yof admitted with new onset Afib and volume overload. CTA chest found acute on chronic PE. LE doppler also shows right DVT. Hospital course complicated by previous GIB. Family in agreement to begin IV heparin. Pharmacy consulted to manage inpatient.   Hgb stable 9s, platelets are normal. Per discussion with FM team, discussing [facility] plan with family before transitioning to apixaban. Family opting to continue on heparin infusion for now with tentative plans to transition next week.   Heparin level subtherapeutic at 0.24. Hgb/plt stable.  No issues with infusion or bleeding noted per RN.   Goal of Therapy:  Heparin  level 0.3-0.5 units/ml d/t recent GIB Monitor platelets by anticoagulation protocol: Yes   Plan:  Increase heparin to 800 units/hr Check 8 hr heparin level Continue to monitor H&H and platelets F/u plans to transition to Eliquis    Andreas Ohm, PharmD Pharmacy Resident  10/06/2022 4:00 PM

## 2022-10-07 DIAGNOSIS — R5381 Other malaise: Secondary | ICD-10-CM | POA: Diagnosis not present

## 2022-10-07 LAB — COMPREHENSIVE METABOLIC PANEL
ALT: 106 U/L — ABNORMAL HIGH (ref 0–44)
AST: 64 U/L — ABNORMAL HIGH (ref 15–41)
Albumin: 2.7 g/dL — ABNORMAL LOW (ref 3.5–5.0)
Alkaline Phosphatase: 89 U/L (ref 38–126)
Anion gap: 10 (ref 5–15)
BUN: 18 mg/dL (ref 8–23)
CO2: 27 mmol/L (ref 22–32)
Calcium: 8.3 mg/dL — ABNORMAL LOW (ref 8.9–10.3)
Chloride: 97 mmol/L — ABNORMAL LOW (ref 98–111)
Creatinine, Ser: 0.72 mg/dL (ref 0.44–1.00)
GFR, Estimated: >60 mL/min (ref >60)
Glucose, Bld: 131 mg/dL — ABNORMAL HIGH (ref 70–99)
Potassium: 4.2 mmol/L (ref 3.5–5.1)
Sodium: 134 mmol/L — ABNORMAL LOW (ref 135–145)
Total Bilirubin: 0.4 mg/dL (ref 0.3–1.2)
Total Protein: 6.2 g/dL — ABNORMAL LOW (ref 6.5–8.1)

## 2022-10-07 LAB — CBC WITH DIFFERENTIAL/PLATELET
Abs Immature Granulocytes: 0.04 10*3/uL (ref 0.00–0.07)
Basophils Absolute: 0.1 10*3/uL (ref 0.0–0.1)
Basophils Relative: 1 %
Eosinophils Absolute: 0.2 10*3/uL (ref 0.0–0.5)
Eosinophils Relative: 2 %
HCT: 29.3 % — ABNORMAL LOW (ref 36.0–46.0)
Hemoglobin: 9.6 g/dL — ABNORMAL LOW (ref 12.0–15.0)
Immature Granulocytes: 1 %
Lymphocytes Relative: 21 %
Lymphs Abs: 1.5 10*3/uL (ref 0.7–4.0)
MCH: 30 pg (ref 26.0–34.0)
MCHC: 32.8 g/dL (ref 30.0–36.0)
MCV: 91.6 fL (ref 80.0–100.0)
Monocytes Absolute: 0.7 10*3/uL (ref 0.1–1.0)
Monocytes Relative: 9 %
Neutro Abs: 4.7 10*3/uL (ref 1.7–7.7)
Neutrophils Relative %: 66 %
Platelets: 200 10*3/uL (ref 150–400)
RBC: 3.2 MIL/uL — ABNORMAL LOW (ref 3.87–5.11)
RDW: 14.7 % (ref 11.5–15.5)
WBC: 7.1 10*3/uL (ref 4.0–10.5)
nRBC: 0 % (ref 0.0–0.2)

## 2022-10-07 LAB — VITAMIN D 25 HYDROXY (VIT D DEFICIENCY, FRACTURES): Vit D, 25-Hydroxy: 33.54 ng/mL (ref 30–100)

## 2022-10-07 LAB — HEPARIN LEVEL (UNFRACTIONATED)
Heparin Unfractionated: 0.39 IU/mL (ref 0.30–0.70)
Heparin Unfractionated: 0.49 IU/mL (ref 0.30–0.70)

## 2022-10-07 LAB — MAGNESIUM: Magnesium: 2.1 mg/dL (ref 1.7–2.4)

## 2022-10-07 NOTE — Progress Notes (Signed)
Occupational Therapy Session Note  Patient Details  Name: [name] [name] MRN: [id number] Date of Birth: 05-11-1930  Today's Date: 10/07/2022 OT Individual Time: 9678-9381 OT Individual Time Calculation (min): 59 min    Short Term Goals: Week 1:  OT Short Term Goal 1 (Week 1): Pt will be Min A for LB dressing OT Short Term Goal 2 (Week 1): Pt will be Mod A for toileting OT Short Term Goal 3 (Week 1): Pt will be Min A for functional transfers OT Short Term Goal 4 (Week 1): Pt will be Supervision for UB dressing  Skilled Therapeutic Interventions/Progress Updates:  Skilled OT intervention completed with focus on blocked practice sit > stands, standing tolerance in prep for ADL independence and toileting needs. Pt received seated in w/c, agreeable to session. No pain reported, however did indicate fatigue. Therapist offered rest breaks for fatigue.  Pt's son was present for communication needs, with pt able to understand about 90% of english, therefore utilized son for clarifications vs translator. Re-explained OT purpose and goals with son indicating that pt will return home with him therefore he was incorporated into education during session.  Transported dependently in w/c <> gym for time. Pt indicated she feels most confident transferring towards her stronger L side, therefore mod A sit > stand using RW then mod A stand pivot to EOM > L side. Pt presents with baseline bilateral tremors with intentional movements that did challenge coordination with transfers and stance.  Seated EOM, pt completed series of sit > stands using RW with initial mod A fading to min A with cues for pushing up with strong L hand and placing then on RW. Pt completed total of about 10 stands with use of mirror for visual feedback for upright posture, however did have poor carryover of technique taught for pushing up to stand. Pt stated she is used to pushing up with BUE on RW, therefore might be hard habit to  break.  Pt demonstrated poor standing tolerance, with longest duration of standing for about 30 seconds at a time. Discussed goals of increasing her standing tolerance with activities for self-care benefits. Participated in squigz removal from high mirror, with pt only able to maintain stance to remove 3 at max with min A needed for balance. Flexed knees and flexed trunk demonstrated with inability to come fully erect despite cues.   Mod A stand pivot back to w/c. Back in room, therapist encouraged pt to use bathroom as w/c pad was noted to be wet upon transfer. She indicated she was wet also. Mod A sit > stand and stand pivot using RW to toilet, total A for doffing pants, with pt sitting prematurely due to fatigue. Brief noted to be fully saturated with incontinent void. Discussed pt calling for assist to go to bathroom with nursing via stedy, with pt stating she didn't know they could help her. Max A sit > stand using RW with pt needing several seated rest breaks during total A toileting steps. Max A stand pivot with +2 utilized for securing pt's hips and w/c stabilization due to pt fatigue and premature sitting.  Pt remained seated in w/c, with belt alarm on/activated, and with all needs in reach at end of session. Family informed of her ability to go to bathroom with nursing and recommendation of voiding every 2-3 hours to maintain skin integrity.  O2 vitals Remained on RA for duration of session with 95% SPO2 or greater   Therapy Documentation Precautions:  Precautions Precautions:  Fall Precaution Comments: Monitor oxygen Restrictions Weight Bearing Restrictions: No    Therapy/Group: Individual Therapy  Melvyn Novas, MS, OTR/L  10/07/2022, 12:22 PM

## 2022-10-07 NOTE — Progress Notes (Signed)
Inpatient Rehabilitation Care Coordinator Assessment and Plan Patient Details  Name: [name] [name] MRN: [id number] Date of Birth: 07-Dec-1929  Today's Date: 10/07/2022  Hospital Problems: Principal Problem:   Debility  Past Medical History:  Past Medical History:  Diagnosis Date   Anxiety    Diabetes (HCC) 10/27/2013   DVT (deep venous thrombosis) (HCC)    Essential tremor 10/27/2013   High cholesterol 1999   Hypertension    Ischemic stroke (HCC) 07/18/2014   Obesity, unspecified 10/27/2013   Pneumonia    Unspecified hereditary and idiopathic peripheral neuropathy 10/27/2013   Past Surgical History:  Past Surgical History:  Procedure Laterality Date   BIOPSY  09/26/2022   Procedure: BIOPSY;  Surgeon: Lynann Bologna, MD;  Location: [facility] ENDOSCOPY;  Service: Gastroenterology;;   CATARACT EXTRACTION Bilateral    ESOPHAGOGASTRODUODENOSCOPY (EGD) WITH PROPOFOL N/[name] 09/26/2022   Procedure: ESOPHAGOGASTRODUODENOSCOPY (EGD) WITH PROPOFOL;  Surgeon: Lynann Bologna, MD;  Location: [facility] ENDOSCOPY;  Service: Gastroenterology;  Laterality: N/[name];   FOOT SURGERY     IR KYPHO LUMBAR INC FX REDUCE BONE BX UNI/BIL CANNULATION INC/IMAGING  06/15/2019   Social History:  reports that she has never smoked. She has never used smokeless tobacco. She reports that she does not drink alcohol and does not use drugs.  Family / Support Systems Marital Status: Widow/Widower How Long?: since 1983 Patient Roles: Parent Spouse/Significant Other: Widowed Children: 5 CHildren ( 1 lives locally, 2 in [place] and dtr as well), and 1 lives in [place] and another in [place]. Other Supports: Pt son reports he was getting an aide through [place] but he is in the process of switching since he is unable to get them toprovide alot of care. Anticipated Caregiver: [name] Ability/Limitations of Caregiver: No conerns reported Caregiver Availability: 24/7 Family Dynamics: Pt lives with her [name].  Social  History Preferred language: Farsi; [name] Religion: Muslim Cultural Background: Pt worked in Nature conservation officer 2015. Education: 6th grade Health Literacy - How often do you need to have someone help you when you read instructions, pamphlets, or other written material from your doctor or pharmacy?: Never Writes: Yes Employment Status: Retired Date Retired/Disabled/Unemployed: Retired after first stroke in 2015 Marine scientist Issues: Denies Guardian/Conservator: N/[name]   Abuse/Neglect Abuse/Neglect Assessment Can Be Completed: Yes Physical Abuse: Denies Verbal Abuse: Denies Sexual Abuse: Denies Exploitation of patient/patient's resources: Denies Self-Neglect: Denies  Patient response to: Social Isolation - How often do you feel lonely or isolated from those around you?: Never  Emotional Status Pt's affect, behavior and adjustment status: Pt in good spirits. She is slow to respond and needs more time before she answers. Recent Psychosocial Issues: Denies Psychiatric History: Denies Substance Abuse History: Denies  Patient / Family Perceptions, Expectations & Goals Pt/Family understanding of illness & functional limitations: Pt and family has [name] general understanding of pt care needs Premorbid pt/family roles/activities: Independent Anticipated changes in roles/activities/participation: Assistance with ADLs/IADls Pt/family expectations/goals: Pt goal is to work on her legs. Her sons would like for her to be able to be how she was in which she was using [name] RW and being able to do things on her own.  Community Resources [employer]: None Premorbid Home Care/DME Agencies: None Transportation available at discharge: TBD Is the patient able to respond to transportation needs?: Yes In the past 12 months, has lack of transportation kept you from medical appointments or from getting medications?: No In the past 12 months, has lack of transportation kept you from  meetings, work, or  from getting things needed for daily living?: No Resource referrals recommended: Neuropsychology  Discharge Planning Living Arrangements: Other relatives Support Systems: Other relatives Type of Residence: Private residence Insurance Resources: Medicaid (specify county), [employer] Resources: Tree surgeon, Family Support Financial Screen Referred: No Living Expenses: Lives with family Money Management: Family Does the patient have any problems obtaining your medications?: No Home Management: Pt and family managed homecare needs Patient/Family Preliminary Plans: TBD Care Coordinator Anticipated Follow Up Needs: HH/OP Expected length of stay: 2-3 weeks  Clinical Impression This SW covering for primary SW, [employer].   SW met with pt and pt two sons and her nephew. She is not [name] Cytogeneticist. POA-[name] [name] (son; lives with pt). DME: RW. Would like referral for CAP/DA as they were asking about other possible options to get more support at home.   [name] [name] [name] [name] 10/07/2022, 4:22 PM

## 2022-10-07 NOTE — Progress Notes (Signed)
ANTICOAGULATION CONSULT NOTE- follow-up  Pharmacy Consult for heparin Indication: pulmonary embolus and DVT  Allergies  Allergen Reactions   Tape Rash    Please do not use Plastic Tape    Patient Measurements:Height: 5' (152.4 cm) Weight: 75.8 kg (167 lb 1.7 oz) IBW/kg (Calculated) : 45.5  Heparin Dosing Weight: 63 kg  Vital Signs: Temp: 98.3 F (36.8 C) (11/13 1326) BP: 157/58 (11/13 1326) Pulse Rate: 77 (11/13 1326)  Labs: Recent Labs    10/05/22 0521 10/05/22 0522 10/06/22 0644 10/06/22 1507 10/07/22 0526 10/07/22 1453  HGB 9.5*  --  9.4*  --  9.6*  --   HCT 29.9*  --  29.8*  --  29.3*  --   PLT 232  --  209  --  200  --   HEPARINUNFRC  --    < > 0.31 0.24* 0.39 0.49  CREATININE  --   --   --   --  0.72  --    < > = values in this interval not displayed.   Estimated Creatinine Clearance: 43.4 mL/min (by C-G formula based on SCr of 0.72 mg/dL).  Medical History: Past Medical History:  Diagnosis Date   Anxiety    Diabetes (HCC) 10/27/2013   DVT (deep venous thrombosis) (HCC)    Essential tremor 10/27/2013   High cholesterol 1999   Hypertension    Ischemic stroke (HCC) 07/18/2014   Obesity, unspecified 10/27/2013   Pneumonia    Unspecified hereditary and idiopathic peripheral neuropathy 10/27/2013    Assessment: 89 yof admitted with new onset Afib and volume overload. CTA chest found acute on chronic PE. LE doppler also shows right DVT. Hospital course complicated by previous GIB. Family in agreement to begin IV heparin. Pharmacy consulted to manage inpatient.   Heparin level 0.49 is therapeutic on 800 units/hr. CIR team will discuss with family about switching to apixaban soon,   Goal of Therapy:  Heparin level 0.3-0.5 units/ml d/t recent GIB Monitor platelets by anticoagulation protocol: Yes   Plan:  Continue heparin at 800 units/h  Monitor daily heparin level, CBC Monitor for signs/symptoms of bleeding  F/u plans to transition to Eliquis    Alphia Moh, PharmD, BCPS, BCCP Clinical Pharmacist  Please check AMION for all [facility] Pharmacy phone numbers After 10:00 PM, call Main Pharmacy [phone]

## 2022-10-07 NOTE — IPOC Note (Signed)
Overall Plan of Care [facility]) Patient Details Name: [name] [name] MRN: [id number] DOB: [birth year]  Admitting Diagnosis: Debility  Hospital Problems: Principal Problem:   Debility     Functional Problem List: Nursing Bowel, Endurance, Medication Management, Pain, Safety, Bladder  PT Balance, Safety, Sensory, Endurance, Motor, Skin Integrity, Pain  OT Balance, Endurance, Motor  SLP    TR         Basic ADL's: OT Grooming, Bathing, Dressing, Toileting     Advanced  ADL's: OT Light Housekeeping, Simple Meal Preparation     Transfers: PT Bed Mobility, Car, Bed to Chair  OT Toilet, Tub/Shower     Locomotion: PT Ambulation, Psychologist, prison and probation services, Stairs     Additional Impairments: OT None  SLP        TR      Anticipated Outcomes Item Anticipated Outcome  Self Feeding Mod I  Swallowing      Basic self-care  Occupational psychologist Transfers Supervision  Bowel/Bladder  manage bowel w mod I assist and bladder w toileting  Transfers  Supv with LRAD  Locomotion  Supv for household distances with LRAD  Communication     Cognition     Pain  < 4 with prns  Safety/Judgment  manage w cues   Therapy Plan: PT Intensity: Minimum of 1-2 x/day ,45 to 90 minutes PT Frequency: 5 out of 7 days PT Duration Estimated Length of Stay: 2-3 weeks OT Intensity: Minimum of 1-2 x/day, 45 to 90 minutes OT Frequency: 5 out of 7 days OT Duration/Estimated Length of Stay: 2-2.5 weeks     Team Interventions: Nursing Interventions Patient/Family Education, Bladder Management, Bowel Management, Disease Management/Prevention, Pain Management, Discharge Planning, Medication Management  PT interventions Ambulation/gait training, Community reintegration, DME/adaptive equipment instruction, Neuromuscular re-education, Psychosocial support, Stair training, UE/LE Strength taining/ROM, Wheelchair propulsion/positioning, Warden/ranger, Discharge  planning, Functional electrical stimulation, Pain management, Skin care/wound management, Therapeutic Activities, UE/LE Coordination activities, Cognitive remediation/compensation, Disease management/prevention, Functional mobility training, Patient/family education, Splinting/orthotics, Therapeutic Exercise, Visual/perceptual remediation/compensation  OT Interventions Warden/ranger, DME/adaptive equipment instruction, Patient/family education, Therapeutic Activities, Therapeutic Exercise, Psychosocial support, Community reintegration, Functional mobility training, Self Care/advanced ADL retraining, UE/LE Strength taining/ROM, Discharge planning, Neuromuscular re-education, UE/LE Coordination activities, Pain management  SLP Interventions    TR Interventions    SW/CM Interventions     Barriers to Discharge MD  Medical stability  Nursing Decreased caregiver support 1 level/level entry w son  PT Home environment access/layout, Inaccessible home environment, Insurance for SNF coverage, Decreased caregiver support    OT      SLP      SW       Team Discharge Planning: Destination: PT-Home ,OT- Home , SLP-  Projected Follow-up: PT-Home health PT, OT-  Home health OT, SLP-  Projected Equipment Needs: PT-To be determined, OT- To be determined, SLP-  Equipment Details: PT-Patient owns a RW, OT-pt has shower chair, w/c, RW Patient/family involved in discharge planning: PT- Patient, Family member/caregiver,  OT-Patient, SLP-   MD ELOS: 10-14 days Medical Rehab Prognosis:  Excellent Assessment: The patient has been admitted for CIR therapies with the diagnosis of debility. The team will be addressing functional mobility, strength, stamina, balance, safety, adaptive techniques and equipment, self-care, bowel and bladder mgt, patient and caregiver education. Goals have been set at supervision. Anticipated discharge destination is home.       See Team Conference Notes for weekly  updates to the plan of care

## 2022-10-07 NOTE — Progress Notes (Signed)
Physical Therapy Session Note  Patient Details  Name: [name] [name] MRN: [id number] Date of Birth: 08-09-1930  Today's Date: 10/07/2022 PT Individual Time: 0930-1000 PT Individual Time Calculation (min): 30 min   Short Term Goals: Week 1:  PT Short Term Goal 1 (Week 1): Patient will performed sit/stand with LRAD and MinA PT Short Term Goal 2 (Week 1): Patient will perform bed/chair transfer with LRAD and ModA x1 PT Short Term Goal 3 (Week 1): Patient will ambulate 32' with LRAD and ModA x1  Skilled Therapeutic Interventions/Progress Updates:    Pt seated in w/c on arrival and agreeable to therapy. No complaint of pain. Pt transported to therapy gym for time management and energy conservation. Pt performed Stand pivot transfer with RW and mod A. Pt with consistent full body tremor throughout session. Pt's son reports this is premorbid. Pt then performed Sit to stand 3 x 10 with RW for std mat height which is elevated d/t pt stature. Pt then participated in standing marching 3 x 5-10. Pt able to perform, but required encouragement d/t fear of falling. Pt returned to room and remained seated in w/c, was left with all needs in reach and alarm active.   Therapy Documentation Precautions:  Precautions Precautions: Fall Precaution Comments: Monitor oxygen Restrictions Weight Bearing Restrictions: No General:       Therapy/Group: Individual Therapy  Juluis Rainier 10/07/2022, 12:21 PM

## 2022-10-07 NOTE — Care Management (Signed)
Inpatient Rehabilitation Center Individual Statement of Services  Patient Name:  [name] [name]  Date:  10/07/2022  Welcome to the Inpatient Rehabilitation Center.  Our goal is to provide you with an individualized program based on your diagnosis and situation, designed to meet your specific needs.  With this comprehensive rehabilitation program, you will be expected to participate in at least 3 hours of rehabilitation therapies Monday-Friday, with modified therapy programming on the weekends.  Your rehabilitation program will include the following services:  Physical Therapy (PT), Occupational Therapy (OT), 24 hour per day rehabilitation nursing, Therapeutic Recreaction (TR), Psychology, Neuropsychology, Care Coordinator, Rehabilitation Medicine, Nutrition Services, Pharmacy Services, and Other  Weekly team conferences will be held on Wednesdays to discuss your progress.  Your Inpatient Rehabilitation Care Coordinator will talk with you frequently to get your input and to update you on team discussions.  Team conferences with you and your family in attendance may also be held.  Expected length of stay: 2-3 weeks    Overall anticipated outcome: Supervision  Depending on your progress and recovery, your program may change. Your Inpatient Rehabilitation Care Coordinator will coordinate services and will keep you informed of any changes. Your Inpatient Rehabilitation Care Coordinator's name and contact numbers are listed  below.  The following services may also be recommended but are not provided by the Inpatient Rehabilitation Center:  Driving Evaluations Home Health Rehabiltiation Services Outpatient Rehabilitation Services Vocational Rehabilitation   Arrangements will be made to provide these services after discharge if needed.  Arrangements include referral to agencies that provide these services.  Your insurance has been verified to be:  Medicare A/B  Your primary doctor is:  [name]  Pertinent information will be shared with your doctor and your insurance company.  Inpatient Rehabilitation Care Coordinator:  Lavera Guise, [place] [phone] or [phone]  Information discussed with and copy given to patient by: Gretchen Short, 10/07/2022, 11:47 AM

## 2022-10-07 NOTE — Progress Notes (Signed)
Patient ID: [name] [name], female   DOB: [birth year], 88 y.o.   MRN: [id number] Met with the patient and a son to review current situation, rehab process, team conference and plan of care. Patient and son report no issues, medication questions, etc at present. Reviewed medications managed by son. Son currently working on Psychiatric nurse for entry to the home. O2 at home PTA. Son reports tremors, still present, appear to be improved over the past weekend. Aware of medications, only new heparin drip is bridge for another medication. Continue to follow along to discharge to address educational needs. Margarito Liner

## 2022-10-07 NOTE — Progress Notes (Signed)
Physical Therapy Session Note  Patient Details  Name: [name] [name] MRN: [id number] Date of Birth: [birth year]  Today's Date: 10/07/2022 PT Individual Time: 6712-4580 and 9983-3825 PT Individual Time Calculation (min): 71 min and 73 min  Short Term Goals: Week 1:  PT Short Term Goal 1 (Week 1): Patient will performed sit/stand with LRAD and MinA PT Short Term Goal 2 (Week 1): Patient will perform bed/chair transfer with LRAD and ModA x1 PT Short Term Goal 3 (Week 1): Patient will ambulate 50' with LRAD and ModA x1  Skilled Therapeutic Interventions/Progress Updates:   Treatment Session 1 Received pt semi-reclined in bed with son present and interpreting throughout session. Pt agreeable to PT treatment and reported discomfort in stomach from constipation but did not rate severity - already received stool softner. Session with emphasis on functional mobility/transfers, generalized strengthening and endurance, standing balance, and gait training. Pt transferred semi-reclined<>sitting EOB with HOB elevated and use of bedrails with min HHA and cues for sequencing. Pt required mod A to scoot to EOB and donned scrub pants sitting EOB with max A. Pt stood in [place] with mod A of 1 and transferred into WC dependently. Pt transported to/from room in [facility] dependently for time management purposes with +2 assist for equipment management. Pt stood inside // bars with mod A x 3 trials. Pt ambulated 29ft x 1, 62ft x 1, and 61ft x 1 inside // bars with min/mod A and son providing close WC follow. Pt limited by fatigue and BLE weakness and required frequent rest breaks throughout session. In dayroom, pt performed seated BLE strengthening on Kinetron at 30 cm/sec for 1 minute x 4 trials with emphasis on glute/quad strength. Returned to room and pt reported urge to toilet. Pt transferred sit<>stand in [place] (due to urgency) from [facility] with mod A +2 and transported to toilet in [place] dependently. Pt stood from [facility] flaps with  min A of 2 and required +2 assist for clothing management while therapist assisted pt with standing. Pt left seated on toilet in care of NT due to time restrictions.   Treatment Session 1 Received pt sitting in [facility] with sons present at bedside. Pt agreeable to PT treatment and denied any pain during session. Session with emphasis on functional mobility/transfers, generalized strengthening and endurance, standing balance, and gait training. Donned pants in sitting with max A and pt stood with RW and mod A and required max A to pull pants over hips. Donned shoes in sitting with max A and pt transported to/from room in [facility] dependently for time management and energy conservation purposes.   Pt required x 2 attempts and heavy mod A to stand from [facility] with RW. Pt ambulated 42ft x 2 trials with RW and mod A with +2 providing close WC follow and managing equipment. Pt with tendency to push RW too far forward and required manual facilitation to control RW as well as maximal encouragement to continue walking, as pt frequently trying to sit. Pt reported that was "enough" and requested to be finished walking. In dayroom, pt transferred WC<>mat stand<>pivot with RW and max/total A. Pt stood with mod A but upon pivoting, pushed RW too far forward and attempting to sit before reaching mat, requiring total A to safely pivot hips to mat. Worked on blocked practice sit<>stands with RW and light mod A x 5 reps however upon standing, pt immediately attempting to sit and with poor carry over of cues to push up from mat rather than pulling up  on RW.   Transitioned to working on dynamic standing balance tossing horseshoes x 1 trial with RUE and min A for balance and mod A to stand from mat - encouraged pt to remain standing to toss all horseshoes, as pt initially sat after throwing just one. Pt politely declined any further participation with horseshoes due to fatigue but was agreeable to seated exercises and performed the following  exercises sitting unsupported EOM with emphasis on strengthening: -LAQ 2x10 bilaterally -hip flexion 2x10 bilaterally -bicep curls with 4lb dowel 2x15 Pt transferred mat<>WC stand<>pivot with RW and max A and returned to room. Pt transferred WC<>bed stand<>pivot with RW and max A. Doffed shoes with max A and transferred sit<>supine with min A. Pt required +2 assist to scoot to [facility]. Attempted to have pt boost herself up in hooklying but pt with difficulty understanding what therapist was asking. Offered bed level exercises as cool down, however pt declined and requested to rest. Concluded session with pt semi-reclined in bed, needs within reach, and bed alarm on. Sons present at bedside.   Therapy Documentation Precautions:  Precautions Precautions: Fall Precaution Comments: Monitor oxygen Restrictions Weight Bearing Restrictions: No  Therapy/Group: Individual Therapy Martin Majestic PT, DPT  10/07/2022, 6:57 AM

## 2022-10-07 NOTE — Progress Notes (Signed)
PROGRESS NOTE   Subjective/Complaints: No new complaints this morning Both sons present She has strong family support among grandchildren as well  ROS- denies CP, SOB,  N/V/D, pain  Objective:   No results found. Recent Labs    10/06/22 0644 10/07/22 0526  WBC 7.7 7.1  HGB 9.4* 9.6*  HCT 29.8* 29.3*  PLT 209 200   Recent Labs    10/07/22 0526  NA 134*  K 4.2  CL 97*  CO2 27  GLUCOSE 131*  BUN 18  CREATININE 0.72  CALCIUM 8.3*    Intake/Output Summary (Last 24 hours) at 10/07/2022 1247 Last data filed at 10/06/2022 1848 Gross per 24 hour  Intake 480 ml  Output --  Net 480 ml        Physical Exam: Vital Signs Blood pressure (!) 145/64, pulse 65, temperature 98.3 F (36.8 C), resp. rate 15, height 5' (1.524 m), weight 75.8 kg, SpO2 97 %. General: No acute distress, BMI 32.64 Mood and affect are appropriate Heart: Regular rate and rhythm no rubs murmurs or extra sounds Lungs: Clear to auscultation, breathing unlabored, no rales or wheezes Abdomen: Positive bowel sounds, soft nontender to palpation, nondistended Extremities: No clubbing, cyanosis, or edema Skin: No evidence of breakdown, no evidence of rash Neurologic: Cranial nerves II through XII intact, motor strength is 5/5 in left and 3-/5 right  deltoid, bicep, tricep, grip, hip flexor, knee extensors, ankle dorsiflexor and plantar flexor Sensory exam hypersensitive to touch RLE  Cerebellar exam- intention tremor RUE Musculoskeletal: Full range of motion in all 4 extremities. No joint swelling, no evidence of Right knee or ankle effusion no erythema    Assessment/Plan: 1. Functional deficits which require 3+ hours per day of interdisciplinary therapy in a comprehensive inpatient rehab setting. Physiatrist is providing close team supervision and 24 hour management of active medical problems listed below. Physiatrist and rehab team continue to  assess barriers to discharge/monitor patient progress toward functional and medical goals  Care Tool:  Bathing    Body parts bathed by patient: Right arm, Left arm, Chest, Abdomen, Right upper leg, Left upper leg, Face   Body parts bathed by helper: Front perineal area, Buttocks, Right lower leg, Left lower leg     Bathing assist Assist Level: Moderate Assistance - Patient 50 - 74%     Upper Body Dressing/Undressing Upper body dressing   What is the patient wearing?: Pull over shirt    Upper body assist Assist Level: Minimal Assistance - Patient > 75%    Lower Body Dressing/Undressing Lower body dressing      What is the patient wearing?: Pants     Lower body assist Assist for lower body dressing: Maximal Assistance - Patient 25 - 49%     Toileting Toileting    Toileting assist Assist for toileting: Total Assistance - Patient < 25%     Transfers Chair/bed transfer  Transfers assist     Chair/bed transfer assist level: Dependent - mechanical lift     Locomotion Ambulation   Ambulation assist   Ambulation activity did not occur: Safety/medical concerns (Unable to assess gait, stair mobility or picking up an item from the floor secondary  to decreased strength, increased tremors, impaired endurance/activity tolerance and poor dynamic stability- Therapist to assess this afternoon if able.)  Assist level: 2 helpers Assistive device: Parallel bars Max distance: 29ft   Walk 10 feet activity   Assist  Walk 10 feet activity did not occur: Safety/medical concerns        Walk 50 feet activity   Assist Walk 50 feet with 2 turns activity did not occur: Safety/medical concerns         Walk 150 feet activity   Assist Walk 150 feet activity did not occur: Safety/medical concerns         Walk 10 feet on uneven surface  activity   Assist Walk 10 feet on uneven surfaces activity did not occur: Safety/medical concerns          Wheelchair     Assist Is the patient using a wheelchair?: Yes Type of Wheelchair: Manual    Wheelchair assist level: Total Assistance - Patient < 25% Max wheelchair distance: 10'- Therapist assisting with the remaining distance secondary to B UE tremors and poor endurance/activity tolerance.    Wheelchair 50 feet with 2 turns activity    Assist        Assist Level: Total Assistance - Patient < 25%   Wheelchair 150 feet activity     Assist      Assist Level: Total Assistance - Patient < 25%   Blood pressure (!) 145/64, pulse 65, temperature 98.3 F (36.8 C), resp. rate 15, height 5' (1.524 m), weight 75.8 kg, SpO2 97 %.  Medical Problem List and Plan: 1. Functional deficits secondary to debility/multi medical including left thalamic posterior limb infarction 2015 receiving inpatient rehab services 07/21/2014 - 07/30/2014             -patient may shower             -ELOS/Goals: 10-14 days  FULL CODE as per discussion with son 2.  Acute on chronic PE: -DVT/anticoagulation: Venous Doppler study 10/02/2022 findings consistent with age-indeterminate DVT right posterior tibial vein as well as CT angiogram of the chest showing acute on chronic pulmonary emboli.   - Intravenous heparin initiated 10/03/2022; continue heparin at 800U/hr, plan to transition to Eliquis -Dr Algie Coffer plans to coordinate with GI in terms of timing the resumption of Eliquis  - Per documentation will need serial [place] for RLE DVT on 11/13 and 11/22              -antiplatelet therapy: N/A   3. Pain Management: N/a, discuss with sons weaning medications if pain is well controlled. Neurontin 100 mg nightly, Cymbalta 20 mg daily, Voltaren gel as needed 4. Mood/Behavior/Sleep: Provide emotional support             -antipsychotic agents: N/A 5. Neuropsych/cognition: This patient is capable of making decisions on her own behalf. 6. Skin/Wound Care: Routine skin checks 7. Fluids/Electrolytes/Nutrition: Routine  in and outs with follow-up chemistries 8.  GI bleed.  Status post upper GI endoscopy 09/26/2022.  Continue PPI twice daily.              - Will need to discuss long-term AC risks and benefits with patient and family prior to discharge. 9.  Acute respiratory failure requiring noninvasive imaging treated with aggressive diuresis.  Check oxygen saturations every shift. Wean oxygen as tolerated.  10.  Acute on chronic diastolic congestive heart failure.  Demadex 20 mg daily.  Monitor for any signs of fluid overload.  Daily weights. Weight  reviewed and is 75.8, up 0.2 kg on 11/13, continue to monitor daily              - Patient was using supplemental oxygen as needed prior to admission. 11.  New onset atrial fibrillation with RVR.  Amiodarone 200 mg daily.  Follow-up cardiology service Dr. Doylene Canard.  Status post DCCV 09/24/2022.  Continue Cardizem 30 mg every 8 hours, Lanoxin 0.0625 mg daily, Toprol 50 mg daily. Add on magnesium level today 12.  Elevated LFTs.  Felt to be possibly due to amiodarone.  Hepatitis panel negative follow-up labs. 13.  Seizure disorder.  Longstanding Keppra 500 mg twice daily 14.  Hyperlipidemia.  Lipitor 15.  Ileus.  Resolving.  Diet advanced to mechanical soft. LBM <24 hours per son.  16.  Obesity.  BMI 33.20.  Dietary follow-up 17.  History of type 2 diabetes mellitus, latest hemoglobin A1c 5.4.  18.  UTI.  80,000 gram-positive cocci/30,000 gram-negative rod.  Initially on Omnicef transition to amoxicillin.  19. Hx essential tremor, severe. On longstanding primidone 250 mg BID, low-dose gabapentin, cymbalta, keppra.       - Per son, can perform ADLs including self feeding/grooming at home.       - Follows closely with OP neurology 20. Screening for vitamin D deficiency: add on Vitamin D level today    LOS: 3 days A FACE TO FACE EVALUATION WAS PERFORMED  [name] [name] 10/07/2022, 12:47 PM

## 2022-10-07 NOTE — Progress Notes (Signed)
ANTICOAGULATION CONSULT NOTE- follow-up  Pharmacy Consult for heparin Indication: pulmonary embolus and DVT  Allergies  Allergen Reactions   Tape Rash    Please do not use Plastic Tape    Patient Measurements:Height: 5' (152.4 cm) Weight: 75.8 kg (167 lb 1.7 oz) IBW/kg (Calculated) : 45.5  Heparin Dosing Weight: 63 kg  Vital Signs: Temp: 98.3 F (36.8 C) (11/13 0547) Temp Source: Oral (11/12 2019) BP: 145/64 (11/13 0547) Pulse Rate: 59 (11/13 0547)  Labs: Recent Labs    10/05/22 0521 10/05/22 0522 10/06/22 0644 10/06/22 1507 10/07/22 0526  HGB 9.5*  --  9.4*  --   --   HCT 29.9*  --  29.8*  --   --   PLT 232  --  209  --   --   HEPARINUNFRC  --    < > 0.31 0.24* 0.39   < > = values in this interval not displayed.    Estimated Creatinine Clearance: 43.4 mL/min (by C-G formula based on SCr of 0.75 mg/dL).  Medical History: Past Medical History:  Diagnosis Date   Anxiety    Diabetes (HCC) 10/27/2013   DVT (deep venous thrombosis) (HCC)    Essential tremor 10/27/2013   High cholesterol 1999   Hypertension    Ischemic stroke (HCC) 07/18/2014   Obesity, unspecified 10/27/2013   Pneumonia    Unspecified hereditary and idiopathic peripheral neuropathy 10/27/2013    Assessment: 89 yof admitted with new onset Afib and volume overload. CTA chest found acute on chronic PE. LE doppler also shows right DVT. Hospital course complicated by previous GIB. Family in agreement to begin IV heparin. Pharmacy consulted to manage inpatient.   Hgb stable 9s, platelets are normal. Per discussion with FM team, discussing [facility] plan with family before transitioning to apixaban. Family opting to continue on heparin infusion for now with tentative plans to transition next week.   Heparin level subtherapeutic at 0.24. Hgb/plt stable.  No issues with infusion or bleeding noted per RN.   11/13 AM follow-up Heparin level 0.39 (therapeutic) No signs of bleeding or issues with the  infusion  Goal of Therapy:  Heparin level 0.3-0.5 units/ml d/t recent GIB Monitor platelets by anticoagulation protocol: Yes   Plan:  Continue heparin at 800 units/hr Check 8 hr heparin level Continue to monitor H&H and platelets F/u plans to transition to Eliquis    Jariya Reichow BS, PharmD, BCPS Clinical Pharmacist 10/07/2022 6:30 AM  Contact: [phone] after 3 PM  "Be curious, not judgmental..." -Debbora Dus

## 2022-10-08 ENCOUNTER — Ambulatory Visit: Payer: Self-pay | Admitting: Family Medicine

## 2022-10-08 LAB — CBC
HCT: 29.5 % — ABNORMAL LOW (ref 36.0–46.0)
Hemoglobin: 9.3 g/dL — ABNORMAL LOW (ref 12.0–15.0)
MCH: 29.1 pg (ref 26.0–34.0)
MCHC: 31.5 g/dL (ref 30.0–36.0)
MCV: 92.2 fL (ref 80.0–100.0)
Platelets: 217 K/uL (ref 150–400)
RBC: 3.2 MIL/uL — ABNORMAL LOW (ref 3.87–5.11)
RDW: 14.6 % (ref 11.5–15.5)
WBC: 6.8 K/uL (ref 4.0–10.5)
nRBC: 0 % (ref 0.0–0.2)

## 2022-10-08 LAB — HEPARIN LEVEL (UNFRACTIONATED): Heparin Unfractionated: 0.53 [IU]/mL (ref 0.30–0.70)

## 2022-10-08 NOTE — Progress Notes (Signed)
ANTICOAGULATION CONSULT NOTE- follow-up  Pharmacy Consult for heparin Indication: pulmonary embolus and DVT  Allergies  Allergen Reactions   Tape Rash    Please do not use Plastic Tape    Patient Measurements: Height: 5' (152.4 cm) Weight: 75.8 kg (167 lb 1.7 oz) IBW/kg (Calculated) : 45.5 Heparin Dosing Weight: 63 kg  Vital Signs:    Labs: Recent Labs    10/06/22 0644 10/06/22 1507 10/07/22 0526 10/07/22 1453 10/08/22 0514  HGB 9.4*  --  9.6*  --  9.3*  HCT 29.8*  --  29.3*  --  29.5*  PLT 209  --  200  --  217  HEPARINUNFRC 0.31   < > 0.39 0.49 0.53  CREATININE  --   --  0.72  --   --    < > = values in this interval not displayed.    Estimated Creatinine Clearance: 43.4 mL/min (by C-G formula based on SCr of 0.72 mg/dL).   Assessment: 49 yof admitted with new onset Afib and volume overload. CTA chest found acute on chronic PE. LE doppler also shows right DVT. Hospital course complicated by previous GIB. Family in agreement to begin IV heparin. Pharmacy consulted to manage inpatient.   Heparin level 0.53 is just above goal range on 800 units/hr. No bleeding noted, Hgb stable 9s, platelets are normal. CIR team will discuss with family about switching to apixaban soon.  Goal of Therapy:  Heparin level 0.3-0.5 units/ml d/t recent GIB Monitor platelets by anticoagulation protocol: Yes   Plan:  Reduce heparin to 750 units/hr  Monitor daily heparin level, CBC Monitor for signs/symptoms of bleeding  F/u plans to transition to Eliquis   Thank you for involving pharmacy in this patient's care.  Loura Back, PharmD, BCPS Clinical Pharmacist Clinical phone for 10/08/2022 until 3p is [phone] 10/08/2022 9:54 AM

## 2022-10-08 NOTE — Progress Notes (Signed)
Occupational Therapy Session Note  Patient Details  Name: [name] [name] MRN: [id number] Date of Birth: [birth year]  Today's Date: 10/08/2022 OT Individual Time: 1105-1200 & 1303-1330 OT Individual Time Calculation (min): 55 min  & 27 min   Short Term Goals: Week 1:  OT Short Term Goal 1 (Week 1): Pt will be Min A for LB dressing OT Short Term Goal 2 (Week 1): Pt will be Mod A for toileting OT Short Term Goal 3 (Week 1): Pt will be Min A for functional transfers OT Short Term Goal 4 (Week 1): Pt will be Supervision for UB dressing  Skilled Therapeutic Interventions/Progress Updates:  Session 1 Skilled OT intervention completed with focus on ADL retraining, standing tolerance, toileting needs. Pt received seated in w/c, agreeable to session. No current pain reported, however did express fatigue.   Therapist utilized standard chair as surface for water basin during bathing due to hemi-height w/c and inaccessibility at sink side due to continuous IV running and inability to shower. Min A needed to doff gowns, especially over IV, then completed bathing with supervision for UB.  Series of 3 sit > stands using RW with mod A for LB bathing/dressing due to fatigue level and inability to remain standing for longer than 10 seconds with min A for balance. Total A needed for doffing brief, and peri hygiene. Upon 3rd stand, pt indicated "pee coming!" Utilized stedy for transfer due to urgency. Max A sit > stand using grab bar on stedy, then cues needed for perched position while in stedy. Required CGA for sitting balance during dependent transfer to [facility] over toilet, with intentional tremors and SOB increasing causing increased incoordination and balance deficits. Mod A sit  >stand from perched position and cues needed for maintaining upright for therapist to move stedy pads out of way. Continent of void. Mod A sit > stand with stedy, then from perched position was able to complete front peri hygiene and  an additional min A stand to donn clothing with total A.   Of note- pt demos stance in stedy vs use of shin pads for balance and due to full body tremors, fatigues quickly with use of stedy. Education provided with son clarifying with communication for carryover, on technique of using shins on shin pad to fully sit for modified transfer and to increase safety as on several accounts pt attempted to stand during dependent transfer requiring up to min A for standing balance and cues to sit. Do not anticipate pt had much carryover with this education though therefore will need continued education.   Pt remained seated in w/c, with belt alarm on/activated, son present and with all needs in reach at end of session.  Session 2 Skilled OT intervention completed with focus on self-feeding and adaptive equipment to enable independence with this BADL. Pt received seated in w/c, agreeable to session. No pain reported.  Pt's son present, with questions regarding if tremors will get better to allow pt to feed herself as she currently requires assist to do. Therapist provided education on baseline tremors and progressive nature with time, however discussed adaptive equipment and strategies that can assist with lessening tremors, I.e. weighted feeding utensils/items and proximal support for increased stability.   Issued pt a weighted mug (unable to locate weighted feeding utensils at this time), with education provided on the lid purpose, and technique of using both hands for picking up cup and bringing to mouth. Initially had pt trial regular cup without straw with ice, and  pt was unable to achieve hand to mouth with R hand without spillage and increased tremors. With weighted cup, pt was able to complete 5+ hand to mouth transitions with cues for technique for using elbows at side/chest for proximal support and was able to take several sips of drink. Encouraged pt to utilize the mug with all her meals for adequate  hydration.  Pt remained seated in w/c, with belt alarm on/activated, and with all needs in reach at end of session.   Therapy Documentation Precautions:  Precautions Precautions: Fall Precaution Comments: Monitor oxygen Restrictions Weight Bearing Restrictions: No    Therapy/Group: Individual Therapy  Melvyn Novas, MS, OTR/L  10/08/2022, 3:33 PM

## 2022-10-08 NOTE — Progress Notes (Signed)
Occupational Therapy Session Note  Patient Details  Name: [name] [name] MRN: [id number] Date of Birth: 09-24-1930  Today's Date: 10/08/2022 OT Individual Time: 0920-0950 OT Individual Time Calculation (min): 30 min    Short Term Goals: Week 1:  OT Short Term Goal 1 (Week 1): Pt will be Min A for LB dressing OT Short Term Goal 2 (Week 1): Pt will be Mod A for toileting OT Short Term Goal 3 (Week 1): Pt will be Min A for functional transfers OT Short Term Goal 4 (Week 1): Pt will be Supervision for UB dressing  Skilled Therapeutic Interventions/Progress Updates:    Pt received in w/c with sons present hookedup to IV. Pt son attends session and provides occasional translation of details. Pt with no pain. Total A transport for energy conservation. Pt focus of session on transitional movement/sit to stand within and without stedy. MAX A without stedy d/t retropulsion/FOF, however with stedy MOD A then MIN once upright to play cornhole in static stance reaching min ranges outside BOS. Exited session with pt seated in bed, exit alarm on and call light in reach   Therapy Documentation Precautions:  Precautions Precautions: Fall Precaution Comments: Monitor oxygen Restrictions Weight Bearing Restrictions: No General:   Therapy/Group: Individual Therapy  Shon Hale 10/08/2022, 9:56 AM

## 2022-10-08 NOTE — Progress Notes (Signed)
Physical Therapy Session Note  Patient Details  Name: [name] [name] MRN: [id number] Date of Birth: [birth year]  Today's Date: 10/08/2022 PT Individual Time: [id number] and [id number] PT Individual Time Calculation (min): 55 min and 54 min  Short Term Goals: Week 1:  PT Short Term Goal 1 (Week 1): Patient will performed sit/stand with LRAD and MinA PT Short Term Goal 2 (Week 1): Patient will perform bed/chair transfer with LRAD and ModA x1 PT Short Term Goal 3 (Week 1): Patient will ambulate 25' with LRAD and ModA x1  Skilled Therapeutic Interventions/Progress Updates:   Treatment Session 1 Received pt semi-reclined in bed with son present at bedside. Pt agreeable to PT treatment and denied any pain during session. Session with emphasis on functional mobility/transfers, dressing, generalized strengthening and endurance, dynamic standing balance/coordination, and gait training. Pt's son reported pt's brief was wet and waiting on NT. Rolled L/R with CGA/min A and doffed dirty brief with total A. Pt performed peri-care with set up assist and donned clean brief in supine with max A via rolling (again, CGA/min A to roll). Pt transferred semi-reclined<>sitting EOB with HOB elevated and use of bedrails with min A for trunk control and cues for technique. Donned pants and shoes sitting EOB with max A and pt stood from EOB with RW and mod A and required total A to pull pants over hips. Pt took seated rest break on bed then transferred bed<>WC stand<>pivot with RW and mod A and transported to/from room in [facility] dependently for time management and energy conservation purposes.   Pt stood with RW and mod A and ambulated 16ft x 1 and 30ft x 1 with RW and mod A fading to min A with +2 for equipment management - cues to keep within RW BOS. Pt initially required manual facilitation to steer RW, but on 2nd trial ambulating, pt able to steer without assist. Pt politely declined any further ambulation due to fatigue and  performed the following seated exercises with emphasis on LE strength/ROM: -LAQ 1x15 with 1.5lb ankle weight -hip flexion 1x15 with 1.5lb ankle weight -hip adduction ball squeezes 1x20 Returned to room and concluded session with pt sitting in WC, needs within reach, and seatbelt alarm on.   Treatment Session 2 Received pt sitting in [facility] with son present at bedside. Pt agreeable to PT treatment and reported mild discomfort in upper back from sitting but requested to wait until later for pain medication. Session with emphasis on functional mobility/transfers, dynamic standing balance, and gait training. Pt transported to/from room in [facility] dependently for time management and energy conservation purposes. Stood x 2 trials from [facility] with RW and mod A and pt ambulated 71ft x 1 and 63ft x 1 with RW and light mod/min A +2 for equipment management. Pt requires tactile and verbal cues for hand placement on WC armrests when standing and demonstrates poor recall of cues to reach back prior to sitting. Pt stated that was "enough", indicating being done walking.   In main gym, pt stood from [facility] with RW and mod A x 3 trials and worked on standing tolerance, posture, and fine motor control clipping clothespins to clothesline using RUE for 1 minute x 2 and 45 seconds x 1 with min A overall due to bilateral tremors. Pt required extended rest breaks in between trials due to fatigue/SOB. Pt removed all clothespins from clothesline (seated due to fatigue) with emphasis on pinch strength. Pt then performed the following UE exercises sitting in WC: -horizontal shoulder  flexion at 90 degrees with 1lb dowel 2x10 -overhead shoulder flexion with 1lb dowel 2x10 -bicep curls with 5lb dowel 2x10 Returned to room and pt reported urge to toilet - retrieved Stedy due to urgency. Pt stood in [place] with mod A and transferred to toilet dependently with +2 assist for equipment management. Pt required total A for clothing management and sat down  before therapist could pull brief/pants completley down; therefore completed doffing in sitting. Pt unable to void but brief soiled in urine. Removed soiled brief and donned clean one and pt stood with max A and required total A to pull brief/pants over hips. NTs arrived and pt left in care of NTs to complete toileting.   Therapy Documentation Precautions:  Precautions Precautions: Fall Precaution Comments: Monitor oxygen Restrictions Weight Bearing Restrictions: No  Therapy/Group: Individual Therapy Martin Majestic PT, DPT  10/08/2022, 7:03 AM

## 2022-10-09 LAB — CBC
HCT: 28.1 % — ABNORMAL LOW (ref 36.0–46.0)
Hemoglobin: 9 g/dL — ABNORMAL LOW (ref 12.0–15.0)
MCH: 29.2 pg (ref 26.0–34.0)
MCHC: 32 g/dL (ref 30.0–36.0)
MCV: 91.2 fL (ref 80.0–100.0)
Platelets: 208 10*3/uL (ref 150–400)
RBC: 3.08 MIL/uL — ABNORMAL LOW (ref 3.87–5.11)
RDW: 14.9 % (ref 11.5–15.5)
WBC: 5.4 10*3/uL (ref 4.0–10.5)
nRBC: 0 % (ref 0.0–0.2)

## 2022-10-09 LAB — IRON AND TIBC
Iron: 41 ug/dL (ref 28–170)
Saturation Ratios: 11 % (ref 10.4–31.8)
TIBC: 370 ug/dL (ref 250–450)
UIBC: 329 ug/dL

## 2022-10-09 LAB — HEPARIN LEVEL (UNFRACTIONATED): Heparin Unfractionated: 0.44 IU/mL (ref 0.30–0.70)

## 2022-10-09 LAB — VITAMIN B12: Vitamin B-12: 1004 pg/mL — ABNORMAL HIGH (ref 180–914)

## 2022-10-09 MED ORDER — POTASSIUM CHLORIDE CRYS ER 20 MEQ PO TBCR
20.0000 meq | EXTENDED_RELEASE_TABLET | Freq: Every day | ORAL | Status: DC
  Administered 2022-10-10 – 2022-10-22 (×13): 20 meq via ORAL
  Filled 2022-10-09 (×14): qty 1

## 2022-10-09 MED ORDER — DILTIAZEM HCL ER COATED BEADS 120 MG PO CP24
120.0000 mg | ORAL_CAPSULE | Freq: Every day | ORAL | Status: DC
  Administered 2022-10-09 – 2022-10-22 (×14): 120 mg via ORAL
  Filled 2022-10-09 (×15): qty 1

## 2022-10-09 MED ORDER — AMIODARONE HCL 200 MG PO TABS
100.0000 mg | ORAL_TABLET | Freq: Every day | ORAL | Status: DC
  Administered 2022-10-10 – 2022-10-22 (×13): 100 mg via ORAL
  Filled 2022-10-09 (×14): qty 1

## 2022-10-09 MED ORDER — VITAMIN D (ERGOCALCIFEROL) 1.25 MG (50000 UNIT) PO CAPS
50000.0000 [IU] | ORAL_CAPSULE | ORAL | Status: DC
  Administered 2022-10-09 – 2022-10-16 (×2): 50000 [IU] via ORAL
  Filled 2022-10-09 (×3): qty 1

## 2022-10-09 NOTE — Progress Notes (Signed)
Occupational Therapy Session Note  Patient Details  Name: [name] [name] MRN: [id number] Date of Birth: 12-Jan-1930  Today's Date: 10/09/2022 OT Individual Time: 0865-7846 & 1417-1530 OT Individual Time Calculation (min): 56 min & 73 min   Short Term Goals: Week 1:  OT Short Term Goal 1 (Week 1): Pt will be Min A for LB dressing OT Short Term Goal 2 (Week 1): Pt will be Mod A for toileting OT Short Term Goal 3 (Week 1): Pt will be Min A for functional transfers OT Short Term Goal 4 (Week 1): Pt will be Supervision for UB dressing  Skilled Therapeutic Interventions/Progress Updates:  Session 1 Skilled OT intervention completed with focus on standing tolerance, dynamic balance, modified strategies for coordination to manage tremors, and BUE exercises. Pt received seated in therapy gym with direct handoff from PT. Agreeable to session. No pain reported.  Completed series of stands at high table to promote sequencing with standing and standing tolerance during task in standing. Able to stand 4 times all with min A from w/c and maintain stance with CGA to min A for balance using RW. Pt was able to transfer poker chips to checker board with each UE, initially only able to place 5 pieces during standing duration of about 1 min, however progressed to 11+ chips with >1 min in stance with cues for posture, and modified technique of sliding the chip on table due to tremor and poor fine motor control. Did require extended seated rest breaks for fatigue and despite repeated cues was unable to follow through with reaching back towards w/c for safety vs just sitting except for 1 account where therapist utilized tactile/hand over hand cueing for completion.  Seated in w/c, pt was able to place and remove wooden pegs from large pegboard, however with poor coordination on RUE and unable on LUE therapist donned 1.5 pound weight to both wrists with improved ability on 2nd trial to place/remove squigz however pt  reported not much difference.  Completed the following exercises seated with 3 pound dowel: -Chest presses 2x10 -Shoulder flexion 2x10 -Overhead presses 2x10 -Bicep flexion 2x10   Transported back to room dependently for time. Pt remained seated in w/c, with family present and planning to attend pt therefore belt alarm left off, and with all needs in reach at end of session.  Session 2 Skilled OT intervention completed with focus on DC planning, toileting needs, cardiovascular endurance. Pt received upright in bed, agreeable to session. No pain reported.  Family present with therapist reviewing educational needs for self-care in prep for discharge and assist at min A level. Family indicates they do not want and will not be assisting with self-care with family stating they plan to hire a caregiver for 3 hours a day, however discussed instances where pt may need assist with bathroom outside of that 3 hour window but with Greig Altergott/plan for pt to be supervision for toileting needs.  With prompting, pt indicated she needed to go to bathroom. Transitioned to EOB with supervision with HOB elevated. Utilized stedy for CGA sit > stand! Then dependent transfer to toilet with supervision for sitting balance after tactile cues provided for using shin plate on BLE for stability. CGA from perched position, total A to doff pants. Continent of void and BM with time. Min A sit stand using stedy grab bar, then pt completed peri-hygiene prior to needing sit break. Doffed pants over hips with total A, with pt attempting to sit prior to stedy flaps being down  with max cues needed for safety to stay standing until ready to sit. Able to maintain perched position for set up A hand hygiene.  Transported dependently in w/c <> gym for time. Min A sit > stand using RW, then min A stand pivot to nustep and mod A for scooting hips back onto seat. Pt completed 3 mins then 3 mins on level 3 for cardiorespiratory and general endurance,  with intermittent rest break needed for fatigue. CGA sit > stand using RW and mod A stand pivot to w/c with pt attempting to sit prior to safe positioning requiring 2 person for w/c stability.  Back in room, min A sit > stand and mid stand pivot, pt began to sit again... on therapist's lap as therapist was behind pt, with max cues and eventually total A needed to pivot hips to EOB for safety. Pt indicated when prompted that it isn't nerves but more so fatigue for her doing this, with therapist educating pt and family on how unsafe this is and fall prevention strategies that need to be in place and self accountability moving forward for discharge with family stating "we are going to have a long hard talk about that later"  Pt remained seated EOB, with nursing present at direct care handoff. All needs in reach at end of session.   Therapy Documentation Precautions:  Precautions Precautions: Fall Precaution Comments: Monitor oxygen Restrictions Weight Bearing Restrictions: No    Therapy/Group: Individual Therapy  Melvyn Novas, MS, OTR/L  10/09/2022, 3:36 PM

## 2022-10-09 NOTE — Progress Notes (Signed)
ANTICOAGULATION CONSULT NOTE- follow-up  Pharmacy Consult for heparin Indication: pulmonary embolus and DVT  Allergies  Allergen Reactions   Tape Rash    Please do not use Plastic Tape    Patient Measurements: Height: 5' (152.4 cm) Weight: 76.4 kg (168 lb 6.9 oz) IBW/kg (Calculated) : 45.5 Heparin Dosing Weight: 63 kg  Vital Signs: Temp: 98.9 F (37.2 C) (11/15 0500) Temp Source: Oral (11/15 0500) BP: 113/47 (11/15 0500) Pulse Rate: 60 (11/15 0500)  Labs: Recent Labs    10/07/22 0526 10/07/22 1453 10/08/22 0514 10/09/22 0442  HGB 9.6*  --  9.3* 9.0*  HCT 29.3*  --  29.5* 28.1*  PLT 200  --  217 208  HEPARINUNFRC 0.39 0.49 0.53 0.44  CREATININE 0.72  --   --   --    Estimated Creatinine Clearance: 43.6 mL/min (by C-G formula based on SCr of 0.72 mg/dL).   Assessment: 57 yof admitted with new onset Afib and volume overload. CTA chest found acute on chronic PE. LE doppler also shows right DVT. Hospital course complicated by previous GIB. Family in agreement to begin IV heparin. Pharmacy consulted to manage inpatient.   Heparin level 0.44 is therapeutic on 750 units/hr. CBC stable. CIR team to discuss with family about switching to apixaban soon.  Goal of Therapy:  Heparin level 0.3-0.5 units/ml d/t recent GIB Monitor platelets by anticoagulation protocol: Yes   Plan:  Continue  heparin to 750 units/hr  Monitor daily heparin level, CBC Monitor for signs/symptoms of bleeding  F/u plans to transition to Eliquis   Thank you for involving pharmacy in this patient's care.  Alphia Moh, PharmD, BCPS, BCCP Clinical Pharmacist  Please check AMION for all [facility] Pharmacy phone numbers After 10:00 PM, call Main Pharmacy [phone]

## 2022-10-09 NOTE — Progress Notes (Signed)
Physical Therapy Session Note  Patient Details  Name: [name] [name] MRN: [id number] Date of Birth: 18-Nov-1930  Today's Date: 10/09/2022 PT Individual Time: 0569-7948 and 0165-5374 PT Individual Time Calculation (min): 39 min and 42 min  Short Term Goals: Week 1:  PT Short Term Goal 1 (Week 1): Patient will performed sit/stand with LRAD and MinA PT Short Term Goal 2 (Week 1): Patient will perform bed/chair transfer with LRAD and ModA x1 PT Short Term Goal 3 (Week 1): Patient will ambulate 32' with LRAD and ModA x1  Skilled Therapeutic Interventions/Progress Updates:   Treatment Session 1 Received pt semi-reclined in bed with son present at bedside. Pt agreeable to PT treatment and denied any pain during session. Session with emphasis on functional mobility/transfers, generalized strengthening and endurance, dynamic standing balance/coordination, and gait training. Pt transferred semi-reclined<>sitting EOB with HOB elevated and use of bedrails with supervision and cues for technique. Donned pants in sitting with max A and pt stood with RW and light mod A and required total A to pull pants over hips. Pt returned to sitting on bed then transferred bed<>WC stand<>pivot with RW and min A and donned shoes with max A. Pt transported to/from room in [facility] dependently for time management purposes. Pt stood with RW and heavy min A and ambulated 61ft x 1 and 77ft x 1 with RW and min A +2 for equipment management. Pt required intermittent cues to keep RW within BOS, but did reach back prior to sitting down in WC once. Pt then performed the following exercises with supervision and verbal cues for technique: -hip flexion 2x10 bilaterally with 1.5lb ankle weight -LAQ 2x10 bilaterally with 1.5lb ankle weight -hip adduction ball squeezes 2x20 -bicep curls with 5lb dowel 2x20 Handed pt off to OT in dayroom.   Treatment Session 2 Received pt sitting in [facility] with sons present at bedside. Pt agreeable to PT  treatment and reported mild pain in upper back from sitting - repositioning done to reduce pain levels. Session with emphasis on functional mobility/transfers, generalized strengthening and endurance, dynamic standing balance/coordination, and gait training. Pt transported to/from room in [employer] dependently for time management purposes. Pt stood with RW and min A and ambulated 34ft x 1 and 45ft x 1 with RW and min A with +2 for equipment management. Pt required min cues to keep RW within BOS and ambulates with flexed trunk/downward gaze, decreased foot clearance, and decreased cadence. Pt then performed seated BLE strengthening on Kinetron at 30 cm/sec for 1 minute x 4 trials with emphasis on glute/quad strengthening. Pt required multiple rest breaks throughout session due to fatigue/SOB, however demonstrates significant improvements in confidence with standing/walking and overall activity tolerance. Returned to room and pt requested to toilet. Due to urgency, stood in [place] with min A and transferred to toilet dependently. Pt required max A for clothing management. Pt unable to void any further despite stating that she needed to, and noted saturated brief/pants - removed soiled clothing and pt left in care of NT due to time restrictions.   Therapy Documentation Precautions:  Precautions Precautions: Fall Precaution Comments: Monitor oxygen Restrictions Weight Bearing Restrictions: No  Therapy/Group: Individual Therapy Martin Majestic PT, DPT  10/09/2022, 6:57 AM

## 2022-10-09 NOTE — Progress Notes (Signed)
PROGRESS NOTE   Subjective/Complaints: Patient's chart reviewed- No issues reported overnight Vitals signs stable except for soft BP. Currently on amiodarone, Digoxin, and Cardizem  ROS- denies CP, SOB,  N/V/D, pain  Objective:   No results found. Recent Labs    10/08/22 0514 10/09/22 0442  WBC 6.8 5.4  HGB 9.3* 9.0*  HCT 29.5* 28.1*  PLT 217 208   Recent Labs    10/07/22 0526  NA 134*  K 4.2  CL 97*  CO2 27  GLUCOSE 131*  BUN 18  CREATININE 0.72  CALCIUM 8.3*    Intake/Output Summary (Last 24 hours) at 10/09/2022 0716 Last data filed at 10/08/2022 1825 Gross per 24 hour  Intake 800 ml  Output --  Net 800 ml        Physical Exam: Vital Signs Blood pressure (!) 113/47, pulse 60, temperature 98.9 F (37.2 C), temperature source Oral, resp. rate 20, height 5' (1.524 m), weight 76.4 kg, SpO2 96 %. General: No acute distress, BMI 32.89 Mood and affect are appropriate Heart: Regular rate and rhythm no rubs murmurs or extra sounds Lungs: Clear to auscultation, breathing unlabored, no rales or wheezes Abdomen: Positive bowel sounds, soft nontender to palpation, nondistended Extremities: No clubbing, cyanosis, or edema Skin: No evidence of breakdown, no evidence of rash, IV heparin attached Neurologic: Cranial nerves II through XII intact, motor strength is 5/5 in left and 3-/5 right  deltoid, bicep, tricep, grip, hip flexor, knee extensors, ankle dorsiflexor and plantar flexor Sensory exam hypersensitive to touch RLE  Cerebellar exam- intention tremor RUE Musculoskeletal: Full range of motion in all 4 extremities. No joint swelling, no evidence of Right knee or ankle effusion no erythema    Assessment/Plan: 1. Functional deficits which require 3+ hours per day of interdisciplinary therapy in a comprehensive inpatient rehab setting. Physiatrist is providing close team supervision and 24 hour management of  active medical problems listed below. Physiatrist and rehab team continue to assess barriers to discharge/monitor patient progress toward functional and medical goals  Care Tool:  Bathing    Body parts bathed by patient: Right arm, Left arm, Chest, Abdomen, Right upper leg, Left upper leg, Face   Body parts bathed by helper: Front perineal area, Buttocks, Right lower leg, Left lower leg     Bathing assist Assist Level: Moderate Assistance - Patient 50 - 74%     Upper Body Dressing/Undressing Upper body dressing   What is the patient wearing?: Pull over shirt    Upper body assist Assist Level: Minimal Assistance - Patient > 75%    Lower Body Dressing/Undressing Lower body dressing      What is the patient wearing?: Pants     Lower body assist Assist for lower body dressing: Maximal Assistance - Patient 25 - 49%     Toileting Toileting    Toileting assist Assist for toileting: Maximal Assistance - Patient 25 - 49%     Transfers Chair/bed transfer  Transfers assist     Chair/bed transfer assist level: Moderate Assistance - Patient 50 - 74%     Locomotion Ambulation   Ambulation assist   Ambulation activity did not occur: Safety/medical concerns (Unable to  assess gait, stair mobility or picking up an item from the floor secondary to decreased strength, increased tremors, impaired endurance/activity tolerance and poor dynamic stability- Therapist to assess this afternoon if able.)  Assist level: 2 helpers Assistive device: Walker-rolling Max distance: 29ft   Walk 10 feet activity   Assist  Walk 10 feet activity did not occur: Safety/medical concerns  Assist level: 2 helpers Assistive device: Walker-rolling   Walk 50 feet activity   Assist Walk 50 feet with 2 turns activity did not occur: Safety/medical concerns         Walk 150 feet activity   Assist Walk 150 feet activity did not occur: Safety/medical concerns         Walk 10 feet on  uneven surface  activity   Assist Walk 10 feet on uneven surfaces activity did not occur: Safety/medical concerns         Wheelchair     Assist Is the patient using a wheelchair?: Yes Type of Wheelchair: Manual    Wheelchair assist level: Total Assistance - Patient < 25% Max wheelchair distance: 10'- Therapist assisting with the remaining distance secondary to B UE tremors and poor endurance/activity tolerance.    Wheelchair 50 feet with 2 turns activity    Assist        Assist Level: Total Assistance - Patient < 25%   Wheelchair 150 feet activity     Assist      Assist Level: Total Assistance - Patient < 25%   Blood pressure (!) 113/47, pulse 60, temperature 98.9 F (37.2 C), temperature source Oral, resp. rate 20, height 5' (1.524 m), weight 76.4 kg, SpO2 96 %.  Medical Problem List and Plan: 1. Functional deficits secondary to debility/multi medical including left thalamic posterior limb infarction 2015 receiving inpatient rehab services 07/21/2014 - 07/30/2014             -patient may shower             -ELOS/Goals: 10-14 days  FULL CODE as per discussion with son  -Interdisciplinary Team Conference today   2.  Acute on chronic PE: -DVT/anticoagulation: Venous Doppler study 10/02/2022 findings consistent with age-indeterminate DVT right posterior tibial vein as well as CT angiogram of the chest showing acute on chronic pulmonary emboli.   - Intravenous heparin initiated 10/03/2022; heparin reduced to 750U/hr, plan to transition to Eliquis -Dr Doylene Canard plans to coordinate with GI in terms of timing the resumption of Eliquis. Discussed today and can resume low dose Eliquis on Friday.  - Per documentation will need serial [place] for RLE DVT on 11/13 and 11/22              -antiplatelet therapy: N/A   3. Pain Management: N/a, discuss with sons weaning medications if pain is well controlled. Neurontin 100 mg nightly, Cymbalta 20 mg daily, Voltaren gel as needed.  Provide list of foods for pain.  4. Mood/Behavior/Sleep: Provide emotional support             -antipsychotic agents: N/A 5. Neuropsych/cognition: This patient is capable of making decisions on her own behalf. 6. Skin/Wound Care: Routine skin checks 7. Fluids/Electrolytes/Nutrition: Routine in and outs with follow-up chemistries 8.  GI bleed.  Status post upper GI endoscopy 09/26/2022.  Continue PPI twice daily.              - Will need to discuss long-term AC risks and benefits with patient and family prior to discharge. Will contact cardiology regarding this today 9.  Acute respiratory failure requiring noninvasive imaging treated with aggressive diuresis.  Check oxygen saturations every shift. Wean oxygen as tolerated.  10.  Acute on chronic diastolic congestive heart failure.  Demadex 20 mg daily.  Monitor for any signs of fluid overload.  Daily weights. Weight reviewed and is 75.8, up 0.2 kg on 11/13, continue to monitor daily              - Patient was using supplemental oxygen as needed prior to admission. 11.  New onset atrial fibrillation with RVR.  Amiodarone 200 mg daily.  Follow-up cardiology service Dr. Algie Coffer.  Status post DCCV 09/24/2022.  Continue Cardizem 30 mg every 8 hours, Lanoxin 0.0625 mg daily, Toprol 50 mg daily. Add on magnesium level today 12.  Elevated LFTs.  Felt to be possibly due to amiodarone.  Hepatitis panel negative follow-up labs. 13.  Seizure disorder.  Longstanding Keppra 500 mg twice daily 14.  Hyperlipidemia.  Lipitor 15.  Ileus.  Resolving.  Diet advanced to mechanical soft. LBM <24 hours per son.  16.  Obesity.  BMI 33.20.  Dietary follow-up 17.  History of type 2 diabetes mellitus, latest hemoglobin A1c 5.4.  18.  UTI.  80,000 gram-positive cocci/30,000 gram-negative rod.  Initially on Omnicef transition to amoxicillin.  19. Hx essential tremor, severe. On longstanding primidone 250 mg BID, low-dose gabapentin, cymbalta, keppra.       - Per son, can  perform ADLs including self feeding/grooming at home.       - Follows closely with OP neurology 20. Suboptimal vitamin D level: Level is 33, ergocalciferol 50,000U once per week for 7 weeks started. 21. Diastolic hypotension: discussed with Dr. Algie Coffer. EKG obtained    LOS: 5 days A FACE TO FACE EVALUATION WAS PERFORMED  Drema Pry Ardythe Klute 10/09/2022, 7:16 AM

## 2022-10-09 NOTE — Patient Care Conference (Signed)
Inpatient RehabilitationTeam Conference and Plan of Care Update Date: 10/09/2022   Time: 11:32 AM    Patient Name: [name] [name]      Medical Record Number: [id number]  Date of Birth: 1930-06-07 Sex: Female         Room/Bed: 4M11C/4M11C-01 Payor Info: Payor: MEDICARE / Plan: MEDICARE PART A AND B / Product Type: *No Product type* /    Admit Date/Time:  10/04/2022  3:20 PM  Primary Diagnosis:  [facility] Problems: Principal Problem:   Debility    Expected Discharge Date: Expected Discharge Date: 10/20/22  Team Members Present: Physician leading conference: Dr. Leeroy Cha Social Worker Present: Erlene Quan, BSW Nurse Present: Dorien Chihuahua, RN PT Present: Becky Sax, PT OT Present: Jennefer Bravo, OT PPS Coordinator present : Gunnar Fusi, SLP     Current Status/Progress Goal Weekly Team Focus  Bowel/Bladder   cont xs 2   remain cont   remain cont, assess PRN    Swallow/Nutrition/ Hydration               ADL's   Min A UB self-care, Max-total A LB, Total A toileting   Min A ADLs, supervision transfers/balance   ADL retraining, dynamic balance, generalized strengthening, cardiorespiratory endurance    Mobility   bed mobility min A, sit<>stands with RW mod A, stand<>pivot transfers max/total A, gait 71ft with RW and mod A +2 - pt is extremly fearful of falling   supervision overall  functional mobility/transfers, generalized strengthening and endurance, dynamic standing balance/coordination, gait training, confidence, and to decrease fear of falling    Communication                Safety/Cognition/ Behavioral Observations               Pain   minor pain in legs, using tylenol   keep pain to minimal   assess PRN to reduce pain    Skin   skin intact   remain intact  remain intact      Discharge Planning:  Patient lives with son, 24/7 avaliable   Team Discussion: Patient with heparin drip bridge to Eliquis 10/11/22,  post GI bleed. MD checking EKG and adjusting medications.  Patient on target to meet rehab goals: Patient with slow progression and self limiting behaviors, needs cues for positioning due to poor carry over.  Needs min - mod assist for transfers and able to ambulate up to 29' with min assist. Needs max - total assist for lower body care and toileting. Goals for discharge set for min - supervision overall.  *See Care Plan and progress notes for long and short-term goals.   Revisions to Treatment Plan:  N/a   Teaching Needs: Safety, medications, dietary modifications, transfers, toileting, etc.  Current Barriers to Discharge: Decreased caregiver support and Home enviroment access/layout  Possible Resolutions to Barriers: Family education HH follow up services Son working on Environmental consultant Current Status: atrial fibrillation, hypotension, obesity, fatigue, suboptimal Vitamin D, symptomatic anemia, polypharmacy, GI bleed, history of seizures  Barriers to Discharge: Medical stability;Symptomatic Anemia;Cardiac Complications;Hypotension  Barriers to Discharge Comments: atrial fibrillation, hypotension, obesity, fatigue, suboptimal Vitamin D, symptomatic anemia, polypharmacy, GI bleed, history of seizures Possible Resolutions to [employer] Focus: discussed with Dr. Doylene Canard and will plan to transition from IV heparin to low dose Eliquis on Friday, EKG obtained today, check iron and B12   Continued Need for Acute Rehabilitation Level of Care: The patient requires daily medical  management by a physician with specialized training in physical medicine and rehabilitation for the following reasons: Direction of a multidisciplinary physical rehabilitation program to maximize functional independence : Yes Medical management of patient stability for increased activity during participation in an intensive rehabilitation regime.: Yes Analysis of laboratory values and/or  radiology reports with any subsequent need for medication adjustment and/or medical intervention. : Yes   I attest that I was present, lead the team conference, and concur with the assessment and plan of the team.   Chana Bode B 10/09/2022, 2:34 PM

## 2022-10-09 NOTE — Progress Notes (Signed)
Patient ID: [name] [name], female   DOB: 1929/12/30, 88 y.o.   MRN: [id number]  Team Conference Report to Patient/Family  Team Conference discussion was reviewed with the patient and caregiver, including goals, any changes in plan of care and target discharge date.  Patient and caregiver express understanding and are in agreement.  The patient has a target discharge date of 10/20/22.  SW met with patient and both sons in room to provide team conference updates. Patient son's are concerned about the patient discharging home with water in her lungs and with blood clots. Sw will follow up with physician to follow up with family. Patient sons express that that they do not feel comfortable assisting patient with ADLS and due to personal and cultural reasons, this would need to be a women assist the patient at home. Currently, there are no other women inside the home. Patient sons requesting HC and CAP. SW will provide family will [facility] resources and submit CAP application. Son will be present for any education needed. Family in discussion of a ramp and will follow up with SW if resources are needed. No additional questions or concerns. Sw will have physician follow up with family on medical concerns.  Dyanne Iha 10/09/2022, 2:21 PM

## 2022-10-10 LAB — DIGOXIN LEVEL: Digoxin Level: 0.7 ng/mL — ABNORMAL LOW (ref 0.8–2.0)

## 2022-10-10 LAB — HEPARIN LEVEL (UNFRACTIONATED): Heparin Unfractionated: 0.56 IU/mL (ref 0.30–0.70)

## 2022-10-10 NOTE — Progress Notes (Signed)
Physical Therapy Session Note  Patient Details  Name: [name] [name] MRN: [id number] Date of Birth: 1930/06/20  Today's Date: 10/10/2022 PT Individual Time: 0910-1005 PT Individual Time Calculation (min): 55 min   Short Term Goals: Week 1:  PT Short Term Goal 1 (Week 1): Patient will performed sit/stand with LRAD and MinA PT Short Term Goal 2 (Week 1): Patient will perform bed/chair transfer with LRAD and ModA x1 PT Short Term Goal 3 (Week 1): Patient will ambulate 49' with LRAD and ModA x1   Skilled Therapeutic Interventions/Progress Updates:  Patient supine in bed on entrance to room. IV placement intact and running with heparin treatment. Son present in room. Patient alert and agreeable to PT session. Requests to toilet.   MD rounding on pt following toileting and informs pt that IV will be discontinued Friday 11/17 and blood thinner to be provided via pill. Pt will be allowed to shower. Discussion with pt and sons re: continuation of pain medications with determination that pain meds will be decreased in order to determine effectiveness and need.   Patient with no pain complaint at start of session.  Therapeutic Activity: Bed Mobility: Pt performed supine --> sit with increased effort, use of bed rails and light MinA only to complete forward scoot to EOB for placement of feet on floor. VC/ tc required for continued effort and forward lean. Transfers: Pt performed sit<>stand and stand pivot transfers throughout session with CGA/ MinA for power up. Pt does have tendency to move to sit early throughout session requiring vc to remain standing. Provided verbal cues for forward positioning on seat and increased forward lean for rise to stand assist. Toilet transfer with CGA. ModA for pericare and brief/ pants mgmt.   Transported to/from ortho gym in w/c dependently for time management purposes.   Gait Training:  Pt ambulated ~10 ft from bed to toilet using RW with CGA initially and  increasing to MinA for RW mgmt and balance/ weakness. Demonstrated increasing LE tremors with distance. Provided vc/ tc for continuing effort to reach target. Son manages IV pole throughout.   Neuromuscular Re-ed: NMR facilitated during session with focus on standing balance, improved confidence, coordination. Pt guided in sit<>stand training with focus on hand placement and assisted UE push to stand. Pt demos desire to immediately sit and guided in rise to stand with 10 sec stance prior to sit. Progressed to toe touches to 4" step alternating 2 touches prior to move to sit. Pt again encouraged to stand for 10 sec prior to sit. 3 bouts with increased confidence noted by third bout. Pt demos increased breathing rate following first two bouts requiring education and guided in 2second breaths in/ out. Longer breathing respirations also assist with decreasing tremors. After final bout, no increase in breathing rate. NMR performed for improvements in motor control and coordination, balance, sequencing, judgement, and self confidence/ efficacy in performing all aspects of mobility at highest level of independence.   Patient seated in w/c at end of session with brakes locked, no alarm set as two sons present for supervision, and all needs within reach.   Therapy Documentation Precautions:  Precautions Precautions: Fall Precaution Comments: Monitor oxygen Restrictions Weight Bearing Restrictions: No General:   Vital Signs: Therapy Vitals Pulse Rate: 60 Pain:  No pain related throughout session. Demos weakness/ fatigue.    Therapy/Group: Individual Therapy  Loel Dubonnet PT, DPT, CSRS 10/10/2022, 9:27 AM

## 2022-10-10 NOTE — Progress Notes (Signed)
Ref: Zola Button, MD   Subjective:  Awake. Improving activity per PT/Rehab.  Amiodarone reduced by 50 % for low BP.  Objective:  Vital Signs in the last 24 hours: Temp:  [97.6 F (36.4 C)-97.9 F (36.6 C)] 97.9 F (36.6 C) (11/16 0312) Pulse Rate:  [52-62] 60 (11/16 0809) Resp:  [15-16] 16 (11/16 0312) BP: (124-144)/(43-53) 124/43 (11/16 0312) SpO2:  [96 %-99 %] 97 % (11/16 0312) Weight:  [74.8 kg] 74.8 kg (11/16 0500)  Physical Exam: BP Readings from Last 1 Encounters:  10/10/22 (!) 124/43     Wt Readings from Last 1 Encounters:  10/10/22 74.8 kg    Weight change: -1.6 kg Body mass index is 32.21 kg/m. HEENT: [place]/AT, Eyes-Brown, Conjunctiva-Pale pink, Sclera-Non-icteric Neck: No JVD, No bruit, Trachea midline. Lungs:  Clearing, Bilateral. Cardiac:  Regular rhythm, normal S1 and S2, no S3. II/VI systolic murmur. Abdomen:  Soft, non-tender. BS present. Extremities:  No edema present. No cyanosis. No clubbing. CNS: AxOx3, Cranial nerves grossly intact, moves all 4 extremities.  Skin: Warm and dry.   Intake/Output from previous day: 11/15 0701 - 11/16 0700 In: 840 [P.O.:840] Out: -     Lab Results: BMET    Component Value Date/Time   NA 134 (L) 10/07/2022 0526   NA 131 (L) 10/04/2022 0622   NA 135 10/03/2022 0135   NA 141 06/14/2022 1505   NA 141 04/26/2020 1708   NA 141 06/22/2018 1448   K 4.2 10/07/2022 0526   K 4.1 10/04/2022 0622   K 4.3 10/03/2022 0135   CL 97 (L) 10/07/2022 0526   CL 91 (L) 10/04/2022 0622   CL 91 (L) 10/03/2022 0135   CO2 27 10/07/2022 0526   CO2 32 10/04/2022 0622   CO2 35 (H) 10/03/2022 0135   GLUCOSE 131 (H) 10/07/2022 0526   GLUCOSE 131 (H) 10/04/2022 0622   GLUCOSE 132 (H) 10/03/2022 0135   BUN 18 10/07/2022 0526   BUN 17 10/04/2022 0622   BUN 17 10/03/2022 0135   BUN 17 06/14/2022 1505   BUN 19 04/26/2020 1708   BUN 14 06/22/2018 1448   CREATININE 0.72 10/07/2022 0526   CREATININE 0.75 10/04/2022 0622   CREATININE  0.75 10/03/2022 0135   CALCIUM 8.3 (L) 10/07/2022 0526   CALCIUM 8.3 (L) 10/04/2022 0622   CALCIUM 8.6 (L) 10/03/2022 0135   GFRNONAA >60 10/07/2022 0526   GFRNONAA >60 10/04/2022 0622   GFRNONAA >60 10/03/2022 0135   GFRAA 69 04/26/2020 1708   GFRAA >60 06/15/2019 0841   GFRAA >60 11/13/2018 1941   CBC    Component Value Date/Time   WBC 5.4 10/09/2022 0442   RBC 3.08 (L) 10/09/2022 0442   HGB 9.0 (L) 10/09/2022 0442   HGB 15.2 04/26/2020 1708   HCT 28.1 (L) 10/09/2022 0442   HCT 46.5 04/26/2020 1708   PLT 208 10/09/2022 0442   PLT 102 (L) 04/26/2020 1708   MCV 91.2 10/09/2022 0442   MCV 94 04/26/2020 1708   MCH 29.2 10/09/2022 0442   MCHC 32.0 10/09/2022 0442   RDW 14.9 10/09/2022 0442   RDW 12.8 04/26/2020 1708   LYMPHSABS 1.5 10/07/2022 0526   LYMPHSABS 1.6 04/26/2020 1708   MONOABS 0.7 10/07/2022 0526   EOSABS 0.2 10/07/2022 0526   EOSABS 0.0 04/26/2020 1708   BASOSABS 0.1 10/07/2022 0526   BASOSABS 0.0 04/26/2020 1708   HEPATIC Function Panel Recent Labs    10/03/22 0135 10/04/22 0622 10/07/22 0526  PROT 5.8* 5.9* 6.2*  ALBUMIN 2.7* 2.7* 2.7*  AST 101* 93* 64*  ALT 160* 146* 106*  ALKPHOS 88 101 89   HEMOGLOBIN A1C Lab Results  Component Value Date   MPG 126 (H) 07/18/2014   CARDIAC ENZYMES Lab Results  Component Value Date   TROPONINI <0.30 03/24/2012   BNP No results for input(s): "PROBNP" in the last 8760 hours. TSH Recent Labs    09/16/22 1929  TSH 1.792   CHOLESTEROL Recent Labs    06/14/22 1505 09/17/22 0207  CHOL 180 161    Scheduled Meds:  amiodarone  100 mg Oral Daily   atorvastatin  10 mg Oral QPM   cycloSPORINE  1 drop Both Eyes BID   digoxin  0.0625 mg Oral Daily   diltiazem  120 mg Oral Daily   gabapentin  100 mg Oral QHS   lactose free nutrition  237 mL Oral TID WC   levETIRAcetam  500 mg Oral BID   magnesium oxide  400 mg Oral Daily   metoprolol succinate  50 mg Oral QPM   multivitamin with minerals  1 tablet  Oral Daily   pantoprazole  40 mg Oral BID   polyethylene glycol  17 g Oral Daily   potassium chloride  20 mEq Oral Daily   torsemide  20 mg Oral Daily   Vitamin D (Ergocalciferol)  50,000 Units Oral Q Wed   Continuous Infusions:  heparin 750 Units/hr (10/10/22 0547)   PRN Meds:.acetaminophen, diclofenac Sodium, guaiFENesin, senna, simethicone  Assessment/Plan: Paroxysmal atrial fibrillation Acute on chronic diastolic left heart failure Acute respiratory failure Acute right loer extremities DVT Abdominal pain Recent GI bleed Gastric ulcer Anemia of blood loss HTN HLD Old stroke Arthritis Parkinson's disease  Plan: Continue IV heparin. May use Eliquis per pharmacy from Monday if okay with GI.   LOS: 6 days   Time spent including chart review, lab review, examination, discussion with patient/family : 30 min   Orpah Cobb  MD  10/10/2022, 12:14 PM

## 2022-10-10 NOTE — Progress Notes (Signed)
PROGRESS NOTE   Subjective/Complaints: No new complaints this morning Sons ask if clots resolved Sons explain that outpatient she had been complaining of leg pain for some time before [place] was checked  ROS- denies CP, SOB,  N/V/D, pain, +chronic tremor  Objective:   No results found. Recent Labs    10/08/22 0514 10/09/22 0442  WBC 6.8 5.4  HGB 9.3* 9.0*  HCT 29.5* 28.1*  PLT 217 208   No results for input(s): "NA", "K", "CL", "CO2", "GLUCOSE", "BUN", "CREATININE", "CALCIUM" in the last 72 hours.   Intake/Output Summary (Last 24 hours) at 10/10/2022 1127 Last data filed at 10/09/2022 1845 Gross per 24 hour  Intake 480 ml  Output --  Net 480 ml        Physical Exam: Vital Signs Blood pressure (!) 124/43, pulse 60, temperature 97.9 F (36.6 C), resp. rate 16, height 5' (1.524 m), weight 74.8 kg, SpO2 97 %. General: No acute distress, BMI 32.89 Mood and affect are appropriate Heart: Regular rate and rhythm no rubs murmurs or extra sounds Lungs: Clear to auscultation, breathing unlabored, no rales or wheezes Abdomen: Positive bowel sounds, soft nontender to palpation, nondistended Extremities: No clubbing, cyanosis, or edema Skin: No evidence of breakdown, no evidence of rash, IV heparin attached Neurologic: Cranial nerves II through XII intact, motor strength is 5/5 in left and 3-/5 right  deltoid, bicep, tricep, grip, hip flexor, knee extensors, ankle dorsiflexor and plantar flexor Sensory exam hypersensitive to touch RLE  Cerebellar exam- intention tremor RUE, chronic tremor of head Musculoskeletal: Full range of motion in all 4 extremities. No joint swelling, no evidence of Right knee or ankle effusion no erythema    Assessment/Plan: 1. Functional deficits which require 3+ hours per day of interdisciplinary therapy in a comprehensive inpatient rehab setting. Physiatrist is providing close team supervision and  24 hour management of active medical problems listed below. Physiatrist and rehab team continue to assess barriers to discharge/monitor patient progress toward functional and medical goals  Care Tool:  Bathing    Body parts bathed by patient: Right arm, Left arm, Chest, Abdomen, Right upper leg, Left upper leg, Face   Body parts bathed by helper: Front perineal area, Buttocks, Right lower leg, Left lower leg     Bathing assist Assist Level: Moderate Assistance - Patient 50 - 74%     Upper Body Dressing/Undressing Upper body dressing   What is the patient wearing?: Pull over shirt    Upper body assist Assist Level: Minimal Assistance - Patient > 75%    Lower Body Dressing/Undressing Lower body dressing      What is the patient wearing?: Pants     Lower body assist Assist for lower body dressing: Maximal Assistance - Patient 25 - 49%     Toileting Toileting    Toileting assist Assist for toileting: Maximal Assistance - Patient 25 - 49%     Transfers Chair/bed transfer  Transfers assist     Chair/bed transfer assist level: Minimal Assistance - Patient > 75%     Locomotion Ambulation   Ambulation assist   Ambulation activity did not occur: Safety/medical concerns (Unable to assess gait, stair mobility or  picking up an item from the floor secondary to decreased strength, increased tremors, impaired endurance/activity tolerance and poor dynamic stability- Therapist to assess this afternoon if able.)  Assist level: 2 helpers Assistive device: Walker-rolling Max distance: 26ft   Walk 10 feet activity   Assist  Walk 10 feet activity did not occur: Safety/medical concerns  Assist level: 2 helpers Assistive device: Walker-rolling   Walk 50 feet activity   Assist Walk 50 feet with 2 turns activity did not occur: Safety/medical concerns         Walk 150 feet activity   Assist Walk 150 feet activity did not occur: Safety/medical concerns          Walk 10 feet on uneven surface  activity   Assist Walk 10 feet on uneven surfaces activity did not occur: Safety/medical concerns         Wheelchair     Assist Is the patient using a wheelchair?: Yes Type of Wheelchair: Manual    Wheelchair assist level: Total Assistance - Patient < 25% Max wheelchair distance: 10'- Therapist assisting with the remaining distance secondary to B UE tremors and poor endurance/activity tolerance.    Wheelchair 50 feet with 2 turns activity    Assist        Assist Level: Total Assistance - Patient < 25%   Wheelchair 150 feet activity     Assist      Assist Level: Total Assistance - Patient < 25%   Blood pressure (!) 124/43, pulse 60, temperature 97.9 F (36.6 C), resp. rate 16, height 5' (1.524 m), weight 74.8 kg, SpO2 97 %.  Medical Problem List and Plan: 1. Functional deficits secondary to debility/multi medical including left thalamic posterior limb infarction 2015 receiving inpatient rehab services 07/21/2014 - 07/30/2014             -patient may shower             -ELOS/Goals: 10-14 days  FULL CODE as per discussion with son  Discussed length of stay with family.  2.  Acute on chronic PE: Discussed with family that it can take time for these to break up and dissolve. -DVT/anticoagulation: Venous Doppler study 10/02/2022 findings consistent with age-indeterminate DVT right posterior tibial vein as well as CT angiogram of the chest showing acute on chronic pulmonary emboli.   - Intravenous heparin initiated 10/03/2022; heparin reduced to 750U/hr, plan to transition to Eliquis -Dr Doylene Canard plans to coordinate with GI in terms of timing the resumption of Eliquis. Discussed today and can resume low dose Eliquis on Friday.  - Per documentation will need serial [place] for RLE DVT on 11/13 and 11/22              -antiplatelet therapy: N/A   3. Pain Management: N/a, discuss with sons weaning medications if pain is well controlled.  Neurontin 100 mg nightly, discontinue cymbalta since not having pain, Voltaren gel as needed. Provide list of foods for pain.  4. Mood/Behavior/Sleep: Provide emotional support             -antipsychotic agents: N/A 5. Neuropsych/cognition: This patient is capable of making decisions on her own behalf. 6. Skin/Wound Care: Routine skin checks 7. Fluids/Electrolytes/Nutrition: Routine in and outs with follow-up chemistries 8.  GI bleed.  Status post upper GI endoscopy 09/26/2022.  Continue PPI twice daily.              - Will need to discuss long-term AC risks and benefits with patient and family  prior to discharge. Will contact cardiology regarding this today 9.  Acute respiratory failure requiring noninvasive imaging treated with aggressive diuresis.  Check oxygen saturations every shift. Wean oxygen as tolerated.  10.  Acute on chronic diastolic congestive heart failure.  Demadex 20 mg daily.  Monitor for any signs of fluid overload.  Daily weights. Weight is down, continue to monitor daily              - Patient was using supplemental oxygen as needed prior to admission. 11.  New onset atrial fibrillation with RVR.  Amiodarone 200 mg daily.  Follow-up cardiology service Dr. Doylene Canard.  Status post DCCV 09/24/2022.  Continue Cardizem 30 mg every 8 hours, Lanoxin 0.0625 mg daily, Toprol 50 mg daily. Add on magnesium level today 12.  Elevated LFTs.  Felt to be possibly due to amiodarone.  Hepatitis panel negative follow-up labs. 13.  Seizure disorder.  Longstanding Keppra 500 mg twice daily, continue 14.  Hyperlipidemia.  Lipitor 15.  Ileus.  Resolving.  Diet advanced to mechanical soft. LBM <24 hours per son.  16.  Obesity.  BMI 33.20-->32.21:  Dietary follow-up 17.  History of type 2 diabetes mellitus, latest hemoglobin A1c 5.4.  18.  UTI.  80,000 gram-positive cocci/30,000 gram-negative rod.  Initially on Omnicef transition to amoxicillin.  19. Hx essential tremor, severe. On longstanding primidone  250 mg BID, low-dose gabapentin, cymbalta, keppra.       - Per son, can perform ADLs including self feeding/grooming at home.       - Follows closely with OP neurology 20. Suboptimal vitamin D level: Level is 33, ergocalciferol 50,000U once per week for 7 weeks started. 21. Diastolic hypotension: discussed with Dr. Doylene Canard. EKG obtained. Appreciate Dr. Doylene Canard seeing her.     LOS: 6 days A FACE TO FACE EVALUATION WAS PERFORMED  Martha Clan P Tarnisha Kachmar 10/10/2022, 11:27 AM

## 2022-10-10 NOTE — Progress Notes (Signed)
Occupational Therapy Session Note  Patient Details  Name: [name] [name] MRN: [id number] Date of Birth: 07-21-1930  Today's Date: 10/10/2022 OT Individual Time: 1350-1430 OT Individual Time Calculation (min): 40 min    Short Term Goals: Week 1:  OT Short Term Goal 1 (Week 1): Pt will be Min A for LB dressing OT Short Term Goal 2 (Week 1): Pt will be Mod A for toileting OT Short Term Goal 3 (Week 1): Pt will be Min A for functional transfers OT Short Term Goal 4 (Week 1): Pt will be Supervision for UB dressing  Skilled Therapeutic Interventions/Progress Updates:     Pt received in w/c with son present and no pain. Pt only reporting general weakness in RLE. Son reporting that is baseline.    ADL: Pt completes functional mobility into and out of bathroom with RW at ambulatory level with MIN A and A to manage IV pole. Pt able ot complete clothing management prior to toileting and hygiene in standing with MIN A but needs A for after voiding.   Therapeutic exercise Functional mobility 2x15 feet with RW in room with son providing w/c follow with prolonged seated rest break for activity tolerance.  Pt completes 3x1 min beach ball volley in seated position with 1 # dowel rod for dynamic balance, postural control, BUE strengthening and endurance required for BADLs and functional transfers.   Pt left at end of session in bed with exit alarm on, call light in reach and all needs met   Therapy Documentation Precautions:  Precautions Precautions: Fall Precaution Comments: Monitor oxygen Restrictions Weight Bearing Restrictions: No General:   Therapy/Group: Individual Therapy  Tonny Branch 10/10/2022, 6:59 AM

## 2022-10-10 NOTE — Progress Notes (Signed)
Physical Therapy Session Note  Patient Details  Name: [name] [name] MRN: [id number] Date of Birth: 04/07/1930  Today's Date: 10/10/2022 PT Individual Time: 1030-1127 PT Individual Time Calculation (min): 57 min   Short Term Goals: Week 1:  PT Short Term Goal 1 (Week 1): Patient will performed sit/stand with LRAD and MinA PT Short Term Goal 2 (Week 1): Patient will perform bed/chair transfer with LRAD and ModA x1 PT Short Term Goal 3 (Week 1): Patient will ambulate 36' with LRAD and ModA x1  Skilled Therapeutic Interventions/Progress Updates:  Received pt sitting in [facility] with son present at bedside. Pt agreeable to PT treatment and denied any pain during session. Session with emphasis on functional mobility/transfers, generalized strengthening and endurance, dynamic standing balance/coordination, simulated car transfers, gait training, and toileting. Donned shoes with max A and pt transported to/from room in [facility] dependently for time management purposes. Pt performed simulated car transfer with RW and min A overall with cues for RW safety when turning. Pt then ambulated 44ft on uneven surfaces (ramp) with RW and min A +2 for equipment management - cues to keep RW within BOS. Attempted to work on BUE strengthening on UBE, however pt too short to reach handles from seated position.   Pt stood with RW and min A x 2 trials and ambulated 1ft x 1 and 9 ft x 1 with RW and min A with +2 for equipment management. Pt requires continued cues to reach back for [facility] armrests prior to sitting and to take slow deep breaths for energy conservation and to better control tremors. Pt appeared more fatigued today, stopping frequently stating "enough" and required encouragement to continue. Pt required multiple extended rest breaks throughout session due to fatigue. Stood with RW and min A and worked on Energy manager with RUE with CGA for balance x 2 trials with emphasis on standing tolerance  and technique with sit<>stand and stand<>sit transfers. Pt required hand over hand guidance to reach back and sit slowly. Asked pt is she needed to void and pt stated yes - returned to room. Pt stood with RW and min A and ambulated in/out of bathroom with RW and min A +2 to manage IV pole. Pt required max A for clothing management and actually able to void - no urine in brief! Discussed importance of pt telling staff when she has to void rather than holding it during therapy sessions. Concluded session with pt sitting in [facility] with all needs within reach and son present at bedside.    Therapy Documentation Precautions:  Precautions Precautions: Fall Precaution Comments: Monitor oxygen Restrictions Weight Bearing Restrictions: No  Therapy/Group: Individual Therapy Martin Majestic PT, DPT  10/10/2022, 7:02 AM

## 2022-10-10 NOTE — Progress Notes (Signed)
ANTICOAGULATION CONSULT NOTE- follow-up  Pharmacy Consult for heparin Indication: pulmonary embolus and DVT  Allergies  Allergen Reactions   Tape Rash    Please do not use Plastic Tape    Patient Measurements: Height: 5' (152.4 cm) Weight: 74.8 kg (164 lb 14.5 oz) IBW/kg (Calculated) : 45.5 Heparin Dosing Weight: 63 kg  Vital Signs: Temp: 97.9 F (36.6 C) (11/16 0312) BP: 124/43 (11/16 0312) Pulse Rate: 60 (11/16 0809)  Labs: Recent Labs    10/08/22 0514 10/09/22 0442 10/10/22 0657  HGB 9.3* 9.0*  --   HCT 29.5* 28.1*  --   PLT 217 208  --   HEPARINUNFRC 0.53 0.44 0.56   Estimated Creatinine Clearance: 43 mL/min (by C-G formula based on SCr of 0.72 mg/dL).   Assessment: 27 yof admitted with new onset Afib and volume overload. CTA chest found acute on chronic PE. LE doppler also shows right DVT. Hospital course complicated by previous GIB. Family in agreement to begin IV heparin. Pharmacy consulted to manage inpatient.   Heparin level 0.56 is therapeutic on 750 units/hr. CBC stable. CIR team to discuss with family about switching to apixaban soon.  Goal of Therapy:  Heparin level 0.3-0.5 units/ml d/t recent GIB Monitor platelets by anticoagulation protocol: Yes   Plan:  Continue heparin to 750 units/hr  Monitor daily heparin level, CBC Monitor for signs/symptoms of bleeding  F/u plans to transition to Eliquis, possibly 11/17   Thank you for involving pharmacy in this patient's care.  Alphia Moh, PharmD, BCPS, BCCP Clinical Pharmacist  Please check AMION for all [facility] phone numbers After 10:00 PM, call Main Pharmacy [phone]

## 2022-10-10 NOTE — Progress Notes (Signed)
Patient ID: [name] [name], female   DOB: [birth year], 88 y.o.   MRN: [id number]  [facility] and [facility] resources emailed to patient granddaughter at [email or web address]

## 2022-10-10 NOTE — Consult Note (Signed)
Ref: [name], MD  Subjective:  Awake. Progressing on activity. EKG-SR with 1st degree AV block  Objective:  Vital Signs in the last 24 hours: Temp:  [97.6 F (36.4 C)-97.9 F (36.6 C)] 97.9 F (36.6 C) (11/16 0312) Pulse Rate:  [52-62] 60 (11/16 0809) Resp:  [15-16] 16 (11/16 0312) BP: (124-144)/(43-53) 124/43 (11/16 0312) SpO2:  [96 %-99 %] 97 % (11/16 0312) Weight:  [74.8 kg] 74.8 kg (11/16 0500)  Physical Exam: BP Readings from Last 1 Encounters:  10/10/22 (!) 124/43     Wt Readings from Last 1 Encounters:  10/10/22 74.8 kg    Weight change: -1.6 kg Body mass index is 32.21 kg/m. HEENT: /AT, Eyes-Brown, Conjunctiva-Pale pink, Sclera-Non-icteric Neck: No JVD, No bruit, Trachea midline. Lungs:  Clear, Bilateral. Cardiac:  Regular rhythm, normal S1 and S2, no S3. II/VI systolic murmur. Abdomen:  Soft, non-tender. BS present. Extremities:  No edema present. No cyanosis. No clubbing. CNS: AxOx3, Cranial nerves grossly intact, moves all 4 extremities.  Skin: Warm and dry.   Intake/Output from previous day: 11/15 0701 - 11/16 0700 In: 840 [P.O.:840] Out: -     Lab Results: BMET    Component Value Date/Time   NA 134 (L) 10/07/2022 0526   NA 131 (L) 10/04/2022 0622   NA 135 10/03/2022 0135   NA 141 06/14/2022 1505   NA 141 04/26/2020 1708   NA 141 06/22/2018 1448   K 4.2 10/07/2022 0526   K 4.1 10/04/2022 0622   K 4.3 10/03/2022 0135   CL 97 (L) 10/07/2022 0526   CL 91 (L) 10/04/2022 0622   CL 91 (L) 10/03/2022 0135   CO2 27 10/07/2022 0526   CO2 32 10/04/2022 0622   CO2 35 (H) 10/03/2022 0135   GLUCOSE 131 (H) 10/07/2022 0526   GLUCOSE 131 (H) 10/04/2022 0622   GLUCOSE 132 (H) 10/03/2022 0135   BUN 18 10/07/2022 0526   BUN 17 10/04/2022 0622   BUN 17 10/03/2022 0135   BUN 17 06/14/2022 1505   BUN 19 04/26/2020 1708   BUN 14 06/22/2018 1448   CREATININE 0.72 10/07/2022 0526   CREATININE 0.75 10/04/2022 0622   CREATININE 0.75 10/03/2022 0135    CALCIUM 8.3 (L) 10/07/2022 0526   CALCIUM 8.3 (L) 10/04/2022 0622   CALCIUM 8.6 (L) 10/03/2022 0135   GFRNONAA >60 10/07/2022 0526   GFRNONAA >60 10/04/2022 0622   GFRNONAA >60 10/03/2022 0135   GFRAA 69 04/26/2020 1708   GFRAA >60 06/15/2019 0841   GFRAA >60 11/13/2018 1941   CBC    Component Value Date/Time   WBC 5.4 10/09/2022 0442   RBC 3.08 (L) 10/09/2022 0442   HGB 9.0 (L) 10/09/2022 0442   HGB 15.2 04/26/2020 1708   HCT 28.1 (L) 10/09/2022 0442   HCT 46.5 04/26/2020 1708   PLT 208 10/09/2022 0442   PLT 102 (L) 04/26/2020 1708   MCV 91.2 10/09/2022 0442   MCV 94 04/26/2020 1708   MCH 29.2 10/09/2022 0442   MCHC 32.0 10/09/2022 0442   RDW 14.9 10/09/2022 0442   RDW 12.8 04/26/2020 1708   LYMPHSABS 1.5 10/07/2022 0526   LYMPHSABS 1.6 04/26/2020 1708   MONOABS 0.7 10/07/2022 0526   EOSABS 0.2 10/07/2022 0526   EOSABS 0.0 04/26/2020 1708   BASOSABS 0.1 10/07/2022 0526   BASOSABS 0.0 04/26/2020 1708   HEPATIC Function Panel Recent Labs    10/03/22 0135 10/04/22 0622 10/07/22 0526  PROT 5.8* 5.9* 6.2*  ALBUMIN 2.7* 2.7* 2.7*  AST 101* 93* 64*  ALT 160* 146* 106*  ALKPHOS 88 101 89   HEMOGLOBIN A1C Lab Results  Component Value Date   MPG 126 (H) 07/18/2014   CARDIAC ENZYMES Lab Results  Component Value Date   TROPONINI <0.30 03/24/2012   BNP No results for input(s): "PROBNP" in the last 8760 hours. TSH Recent Labs    09/16/22 1929  TSH 1.792   CHOLESTEROL Recent Labs    06/14/22 1505 09/17/22 0207  CHOL 180 161    Scheduled Meds:  amiodarone  100 mg Oral Daily   atorvastatin  10 mg Oral QPM   cycloSPORINE  1 drop Both Eyes BID   digoxin  0.0625 mg Oral Daily   diltiazem  120 mg Oral Daily   gabapentin  100 mg Oral QHS   lactose free nutrition  237 mL Oral TID WC   levETIRAcetam  500 mg Oral BID   magnesium oxide  400 mg Oral Daily   metoprolol succinate  50 mg Oral QPM   multivitamin with minerals  1 tablet Oral Daily    pantoprazole  40 mg Oral BID   polyethylene glycol  17 g Oral Daily   potassium chloride  20 mEq Oral Daily   torsemide  20 mg Oral Daily   Vitamin D (Ergocalciferol)  50,000 Units Oral Q Wed   Continuous Infusions:  heparin 750 Units/hr (10/10/22 0547)   PRN Meds:.acetaminophen, diclofenac Sodium, guaiFENesin, senna, simethicone  Assessment/Plan: Paroxysmal atrial fibrillation Acute on chronic diastolic left heart failure Acute respiratory failure Acute right loer extremities DVT Abdominal pain Recent GI bleed Gastric ulcer Anemia of blood loss HTN HLD Old stroke Arthritis Parkinson's disease  Plan: Continue medical treatment. May use lower dose Eliquis if okay with parmacy and GI.   LOS: 6 days   Time spent including chart review, lab review, examination, discussion with patient : 30 min   Dixie Dials  MD  10/10/2022, 12:19 PM

## 2022-10-11 LAB — HEPARIN LEVEL (UNFRACTIONATED): Heparin Unfractionated: 0.45 IU/mL (ref 0.30–0.70)

## 2022-10-11 MED ORDER — APIXABAN 2.5 MG PO TABS
2.5000 mg | ORAL_TABLET | Freq: Two times a day (BID) | ORAL | Status: DC
  Administered 2022-10-11 – 2022-10-22 (×24): 2.5 mg via ORAL
  Filled 2022-10-11 (×25): qty 1

## 2022-10-11 NOTE — Progress Notes (Signed)
Occupational Therapy Session Note  Patient Details  Name: [name] [name] MRN: [id number] Date of Birth: 1930-05-04  Today's Date: 10/11/2022 OT Individual Time: 0905-1000 & 1415-1530 OT Individual Time Calculation (min): 55 min & 68 min OT missed time: 7 min Missed time reason: patient fatigue  Short Term Goals: Week 1:  OT Short Term Goal 1 (Week 1): Pt will be Min A for LB dressing OT Short Term Goal 2 (Week 1): Pt will be Mod A for toileting OT Short Term Goal 3 (Week 1): Pt will be Min A for functional transfers OT Short Term Goal 4 (Week 1): Pt will be Supervision for UB dressing  Skilled Therapeutic Interventions/Progress Updates:  Session 1 Skilled OT intervention completed with focus on toileting education/needs, standing tolerance, BUE endurance. Pt received seated in w/c, agreeable to session. No pain reported, however did express fatigue with therapist offering rest breaks and repositioning.  Pt's son present, expressing that his greatest concern is her ability to use bathroom independently. He indicated her other son who she lives with for cultural reasons won't assist with toileting but plans to hire caregiver for showers and ADLs therefore pt will need to be mostly independently/mod I however would like to learn in a more private setting how to help if an emergency.  Completed min A sit > stand using RW then min A ambulatory transfer to [facility] over toilet with cues for RW positioning as pt has tendency to push out RW from her. Mod A for doffing clothing down with pt sitting prior to brief being all the way down. Educated pt on modified technique if fatigue sets in to continue doffing in sitting prior to actively voiding if unable to doff all the way down in stance. With time, pt was continent of void. Stood with min A using RW, able to manage peri-care with CGA for balance, however required mod A for donning pants over hips with 1 seated rest break also needed in between for  fatigue prior to exiting bathroom.  Completed simulated toileting task using clips on pants of buttock region, with son present for learning. Able to stand for about 30 seconds at a time, and did have some difficulty retrieving very far back clips. Discussed importance of pt verbalizing the need of sit break in prep for discharge safety, however with only min carryover during session as pt would sit prior to verbalizing or gesturing.   Seated in w/c, pt completed the following with 4 pound ball, 2x10: -Chest presses -Bicep flexion -Overhead presses  Pt remained seated in w/c, with son present, and with all needs in reach at end of session.  Session 2 Skilled OT intervention completed with focus on functional ambulatory endurance, dynamic balance, BUE coordination. Pt received seated in w/c, agreeable to session. RLE unrated pain reported, with report of heaviness. Education provided on DVT symptoms as hers is being managed, however also fatigue level from general exercise that can cause heaviness. Pt was pre-medicated. Therapist offered rest breaks and repositioning throughout for pain reduction.  Continuous IV still present therefore unable to shower.  Transported to hallway, then completed 27 ft with CGA using RW with cues needed for erect posture, RW positioning and w/c follow needed for fatigue. With visual target for encouragement to achieve a greater distance, pt was able to complete a 2nd trial of 28 ft with same technique and assist, stating "enough!" Once she reached visual target.  Transported to gym rest of way in w/c dependently due to  fatigue.  In stance with CGA for balance using RW, pt was able to participate in corn hole activity x2 trials in standing with greater than 1 min in stance. Cues for erect posture. Extended rest breaks needed for fatigue.   Encouraged pt to participate in seated tasks for modified exercises however indicated "enough" to therapist despite multiple  offers. Pt missed 7 mins of OT intervention due to fatigue.  Transported back to room in w/c. Assisted pt with ambulatory transfer to bathroom using RW with min A, did pull pants down with min A this time. Continent of void. Able to complete toileting with min A. Ambulated to EOB, with mod A and mod cues needed for positioning to EOB prior to sitting. Min A bed mobility to supine and +2 assist utilized for boosting towards HOB.  Pt remained semi-supine in bed, with bed alarm on/activated, nurse made aware of IV with visible blood in tube, and with all needs in reach at end of session.   Therapy Documentation Precautions:  Precautions Precautions: Fall Precaution Comments: Monitor oxygen Restrictions Weight Bearing Restrictions: No    Therapy/Group: Individual Therapy  Melvyn Novas, MS, OTR/L  10/11/2022, 3:32 PM

## 2022-10-11 NOTE — Progress Notes (Signed)
As per gastroenterology service note 09/26/2022 plan was to hold [facility] x2 weeks.  We will begin Eliquis back today 2.5 mg twice daily.  Discussed at length with family.  Monitor CBC.  Discontinue intravenous heparin.

## 2022-10-11 NOTE — Progress Notes (Signed)
ANTICOAGULATION CONSULT NOTE- follow-up  Pharmacy Consult for heparin Indication: pulmonary embolus and DVT  Allergies  Allergen Reactions   Tape Rash    Please do not use Plastic Tape    Patient Measurements: Height: 5' (152.4 cm) Weight: 74.8 kg (164 lb 14.5 oz) IBW/kg (Calculated) : 45.5 Heparin Dosing Weight: 63 kg  Vital Signs: Temp: 98.2 F (36.8 C) (11/17 0400) Temp Source: Oral (11/17 0400) BP: 119/45 (11/17 0400) Pulse Rate: 65 (11/17 0400)  Labs: Recent Labs    10/09/22 0442 10/10/22 0657 10/11/22 0605  HGB 9.0*  --   --   HCT 28.1*  --   --   PLT 208  --   --   HEPARINUNFRC 0.44 0.56 0.45   Estimated Creatinine Clearance: 43 mL/min (by C-G formula based on SCr of 0.72 mg/dL).   Assessment: 47 yof admitted with new onset Afib and volume overload. CTA chest found acute on chronic PE. LE doppler also shows right DVT. Hospital course complicated by previous GIB. Family in agreement to begin IV heparin. Pharmacy consulted to manage inpatient.   Heparin level 0.45 is therapeutic on 750 units/hr. CBC stable. CIR team to discuss with family about switching to apixaban soon.  Goal of Therapy:  Heparin level 0.3-0.5 units/ml d/t recent GIB Monitor platelets by anticoagulation protocol: Yes   Plan:  Continue heparin to 750 units/hr  Monitor daily heparin level, CBC Monitor for signs/symptoms of bleeding  F/u plans to transition to Eliquis, possibly 11/17   Thank you for involving pharmacy in this patient's care.  Alphia Moh, PharmD, BCPS, BCCP Clinical Pharmacist  Please check AMION for all [facility]acy phone numbers After 10:00 PM, call Main Pharmacy [phone]

## 2022-10-11 NOTE — Progress Notes (Signed)
Physical Therapy Session Note  Patient Details  Name: [name] [name] MRN: [id number] Date of Birth: 1930/06/15  Today's Date: 10/11/2022 PT Individual Time: 7062-3762 PT Individual Time Calculation (min): 70 min   Short Term Goals: Week 1:  PT Short Term Goal 1 (Week 1): Patient will performed sit/stand with LRAD and MinA PT Short Term Goal 2 (Week 1): Patient will perform bed/chair transfer with LRAD and ModA x1 PT Short Term Goal 3 (Week 1): Patient will ambulate 27' with LRAD and ModA x1  Skilled Therapeutic Interventions/Progress Updates:   Received pt sitting in [facility] with family present at bedside. Pt agreeable to PT treatment and denied any pain during session. Session with emphasis on functional mobility/transfers, toileting, generalized strengthening and endurance, dynamic standing balance/coordination, and gait training. Pt reported urge to use restroom. Stood with RW and min A and ambulated in/out of bathroom with RW and min A (+2 for IV pole management) Pt required max A for clothing management and max cues to remain standing as pt attempting to sit before pants/brief were down. Pt able to void and performed hygiene management with supervision. Pt transported to/from room in [facility] dependently for time management and energy conservation purposes. Pt stood with RW and min A x 2 trials and ambulated 10ft x 2 trials with RW and min A +2 for equipment management. Pt required encouragement to continue as pt stating "enough" and attempting to sit early. Pt required hand over hand cues to reach back for [facility] armrests prior to sitting. Pt stood with RW and min A x 2 trials and performed alternating toe taps to 3in step 4x10 with BUE support - cues for pursed lip breathing. Pt then performed seated BUE strengthening batting beach ball with baseball bat 4x10 reps to fatigue. Pt required rest breaks in between sets. Pt then performed overhead chest press x10 with 2lb dowel and x10 and ER with 3lb dowel  2x10. Returned to room and concluded session with pt sitting in [facility], needs within reach, and family present at bedside.   Therapy Documentation Precautions:  Precautions Precautions: Fall Precaution Comments: Monitor oxygen Restrictions Weight Bearing Restrictions: No  Therapy/Group: Individual Therapy Martin Majestic PT, DPT  10/11/2022, 7:05 AM

## 2022-10-11 NOTE — Progress Notes (Signed)
Occupational Therapy Weekly Progress Note  Patient Details  Name: [name] [name] MRN: [id number] Date of Birth: [birth year]  Beginning of progress report period: October 05, 2022 End of progress report period: October 11, 2022   Patient has met 2 of 4 short term goals. Pt is making slow but effortful progress towards LTGs. Pt is at a mod A stand pivot transfer level using RW, is able to sponge bathe at an overall mod A level, dress with overall max A using and requires mod-max assist for toileting tasks. Pt continues to demonstrate intentional tremors vs at rest, SOB with increased activity, low activity tolerance and endurance, and decreased safety awareness resulting in difficulty completing BADL tasks without increased physical assist. Pt will benefit from continued skilled OT services to focus on mentioned deficits, as well as for family to participate in education regarding self-care needs prior to discharge.  Patient continues to demonstrate the following deficits: muscle weakness, decreased cardiorespiratoy endurance and decreased oxygen support, decreased coordination, decreased problem solving and decreased safety awareness, and decreased standing balance and therefore will continue to benefit from skilled OT intervention to enhance overall performance with BADL, iADL, and Reduce care partner burden.  Patient progressing toward long term goals..  Continue plan of care.  OT Short Term Goals Week 1:  OT Short Term Goal 1 (Week 1): Pt will be Min A for LB dressing OT Short Term Goal 1 - Progress (Week 1): Not met OT Short Term Goal 2 (Week 1): Pt will be Mod A for toileting OT Short Term Goal 2 - Progress (Week 1): Not met OT Short Term Goal 3 (Week 1): Pt will be Min A for functional transfers OT Short Term Goal 3 - Progress (Week 1): Met OT Short Term Goal 4 (Week 1): Pt will be Supervision for UB dressing OT Short Term Goal 4 - Progress (Week 1): Met Week 2:  OT Short Term Goal 1  (Week 2): STG = LTG due to [place], MS, OTR/L  10/11/2022, 7:58 AM

## 2022-10-11 NOTE — Progress Notes (Signed)
PROGRESS NOTE   Subjective/Complaints: GI cleared her to restart low dose Eliquis She has no new complaints this morning Family at bedside Family is eager for her to shower  ROS- denies CP, SOB,  N/V/D, pain, +chronic tremor  Objective:   No results found. Recent Labs    10/09/22 0442  WBC 5.4  HGB 9.0*  HCT 28.1*  PLT 208   No results for input(s): "NA", "K", "CL", "CO2", "GLUCOSE", "BUN", "CREATININE", "CALCIUM" in the last 72 hours.   Intake/Output Summary (Last 24 hours) at 10/11/2022 1514 Last data filed at 10/11/2022 1213 Gross per 24 hour  Intake 360 ml  Output --  Net 360 ml        Physical Exam: Vital Signs Blood pressure (!) 152/75, pulse 73, temperature 98 F (36.7 C), resp. rate 18, height 5' (1.524 m), weight 74.8 kg, SpO2 97 %. General: No acute distress, BMI 32.89 Mood and affect are appropriate Heart: Regular rate and rhythm no rubs murmurs or extra sounds Lungs: Clear to auscultation, breathing unlabored, no rales or wheezes Abdomen: Positive bowel sounds, soft nontender to palpation, nondistended Extremities: No clubbing, cyanosis, or edema Skin: No evidence of breakdown, no evidence of rash, IV heparin attached Neurologic: Cranial nerves II through XII intact, motor strength is 5/5 in left and 3-/5 right  deltoid, bicep, tricep, grip, hip flexor, knee extensors, ankle dorsiflexor and plantar flexor Sensory exam hypersensitive to touch RLE  Cerebellar exam- intention tremor RUE, chronic tremor of head Musculoskeletal: Full range of motion in all 4 extremities. No joint swelling, no evidence of Right knee or ankle effusion no erythema  Ambulating MinA with RW   Assessment/Plan: 1. Functional deficits which require 3+ hours per day of interdisciplinary therapy in a comprehensive inpatient rehab setting. Physiatrist is providing close team supervision and 24 hour management of active medical  problems listed below. Physiatrist and rehab team continue to assess barriers to discharge/monitor patient progress toward functional and medical goals  Care Tool:  Bathing    Body parts bathed by patient: Right arm, Left arm, Chest, Abdomen, Right upper leg, Left upper leg, Face   Body parts bathed by helper: Front perineal area, Buttocks, Right lower leg, Left lower leg     Bathing assist Assist Level: Moderate Assistance - Patient 50 - 74%     Upper Body Dressing/Undressing Upper body dressing   What is the patient wearing?: Pull over shirt    Upper body assist Assist Level: Minimal Assistance - Patient > 75%    Lower Body Dressing/Undressing Lower body dressing      What is the patient wearing?: Pants     Lower body assist Assist for lower body dressing: Maximal Assistance - Patient 25 - 49%     Toileting Toileting    Toileting assist Assist for toileting: Moderate Assistance - Patient 50 - 74%     Transfers Chair/bed transfer  Transfers assist     Chair/bed transfer assist level: Contact Guard/Touching assist     Locomotion Ambulation   Ambulation assist   Ambulation activity did not occur: Safety/medical concerns (Unable to assess gait, stair mobility or picking up an item from the floor secondary  to decreased strength, increased tremors, impaired endurance/activity tolerance and poor dynamic stability- Therapist to assess this afternoon if able.)  Assist level: 2 helpers Assistive device: Walker-rolling Max distance: 42ft   Walk 10 feet activity   Assist  Walk 10 feet activity did not occur: Safety/medical concerns  Assist level: 2 helpers Assistive device: Walker-rolling   Walk 50 feet activity   Assist Walk 50 feet with 2 turns activity did not occur: Safety/medical concerns         Walk 150 feet activity   Assist Walk 150 feet activity did not occur: Safety/medical concerns         Walk 10 feet on uneven surface   activity   Assist Walk 10 feet on uneven surfaces activity did not occur: Safety/medical concerns   Assist level: 2 helpers Assistive device: Aeronautical engineer Is the patient using a wheelchair?: Yes Type of Wheelchair: Manual    Wheelchair assist level: Total Assistance - Patient < 25% Max wheelchair distance: 10'- Therapist assisting with the remaining distance secondary to B UE tremors and poor endurance/activity tolerance.    Wheelchair 50 feet with 2 turns activity    Assist        Assist Level: Total Assistance - Patient < 25%   Wheelchair 150 feet activity     Assist      Assist Level: Total Assistance - Patient < 25%   Blood pressure (!) 152/75, pulse 73, temperature 98 F (36.7 C), resp. rate 18, height 5' (1.524 m), weight 74.8 kg, SpO2 97 %.  Medical Problem List and Plan: 1. Functional deficits secondary to debility/multi medical including left thalamic posterior limb infarction 2015 receiving inpatient rehab services 07/21/2014 - 07/30/2014             -patient may shower             -ELOS/Goals: 10-14 days  FULL CODE as per discussion with son  Discussed length of stay with family.   Updated family 2.  Acute on chronic PE: Discussed with family that it can take time for these to break up and dissolve. -DVT/anticoagulation: Venous Doppler study 10/02/2022 findings consistent with age-indeterminate DVT right posterior tibial vein as well as CT angiogram of the chest showing acute on chronic pulmonary emboli.   Change IV heparin to low dose Eliquis as per GI - Per documentation will need serial [place] for RLE DVT on 11/13 and 11/22              -antiplatelet therapy: N/A   3. Pain Management: N/a, discuss with sons weaning medications if pain is well controlled. D/c Neurontin 100 mg nightly, discontinue cymbalta since not having pain, Voltaren gel as needed. Provide list of foods for pain.  4. Mood/Behavior/Sleep: Provide  emotional support             -antipsychotic agents: N/A 5. Neuropsych/cognition: This patient is capable of making decisions on her own behalf. 6. Skin/Wound Care: Routine skin checks 7. Fluids/Electrolytes/Nutrition: Routine in and outs with follow-up chemistries 8.  GI bleed.  Status post upper GI endoscopy 09/26/2022.  Continue PPI twice daily. Cleared to restart Eliquis by GI 9.  Acute respiratory failure requiring noninvasive imaging treated with aggressive diuresis.  Check oxygen saturations every shift. Wean oxygen as tolerated.  10.  Acute on chronic diastolic congestive heart failure.  Demadex 20 mg daily.  Monitor for any signs of fluid overload.  Daily weights. Weight is down, continue  to monitor daily              - Patient was using supplemental oxygen as needed prior to admission. 11.  New onset atrial fibrillation with RVR.  Amiodarone 200 mg daily.  Follow-up cardiology service Dr. Algie Coffer.  Status post DCCV 09/24/2022.  Continue Cardizem 30 mg every 8 hours, Lanoxin 0.0625 mg daily, Toprol 50 mg daily. Magnesium level reviewed and is normal 12.  Elevated LFTs.  Felt to be possibly due to amiodarone.  Hepatitis panel negative follow-up labs. 13.  Seizure disorder.  Longstanding Keppra 500 mg twice daily, continue 14.  Hyperlipidemia.  Lipitor 15.  Ileus.  Resolving.  Diet advanced to mechanical soft. LBM <24 hours per son.  16.  Obesity.  BMI 33.20-->32.21:  Dietary follow-up 17.  History of type 2 diabetes mellitus, latest hemoglobin A1c 5.4.  18.  UTI.  80,000 gram-positive cocci/30,000 gram-negative rod.  Initially on Omnicef transition to amoxicillin.  19. Hx essential tremor, severe. On longstanding primidone 250 mg BID, low-dose gabapentin, cymbalta, keppra.       - Per son, can perform ADLs including self feeding/grooming at home.       - Follows closely with OP neurology 20. Suboptimal vitamin D level: Level is 33, ergocalciferol 50,000U once per week for 7 weeks  started. 21. Diastolic hypotension: discussed with Dr. Algie Coffer. EKG obtained and reviewed. Appreciate Dr. Algie Coffer seeing her. Amiodarone dose reduced by 50%    LOS: 7 days A FACE TO FACE EVALUATION WAS PERFORMED  Drema Pry Deondray Ospina 10/11/2022, 3:14 PM

## 2022-10-11 NOTE — Progress Notes (Signed)
Patient ID: [name] [name], female   DOB: [birth year], 88 y.o.   MRN: [id number]  SW followed up with patient son in room. Patient son expressed he has spoken with the physician. HC and HH resources provided. No additional questions or concerns.

## 2022-10-11 NOTE — Progress Notes (Signed)
Physical Therapy Session Note  Patient Details  Name: [name] [name] MRN: [id number] Date of Birth: 1930-06-07  Today's Date: 10/10/2022 PT Individual Time:1202-1229   27 min   Short Term Goals: Week 1:  PT Short Term Goal 1 (Week 1): Patient will performed sit/stand with LRAD and MinA PT Short Term Goal 2 (Week 1): Patient will perform bed/chair transfer with LRAD and ModA x1 PT Short Term Goal 3 (Week 1): Patient will ambulate 75' with LRAD and ModA x1  Skilled Therapeutic Interventions/Progress Updates:   Pt received sitting in WC and agreeable to PT. Gait training 2 x 12 ft with min assist from PT. And cues for posture and step length as tolerated by pt. Sit<>stand transfers with RW and min assist from PT x 5 throughout session. WC mobility x 121f with min assist for direction.  [facility] and all needs met.        Therapy Documentation Precautions:  Precautions Precautions: Fall Precaution Comments: Monitor oxygen Restrictions Weight Bearing Restrictions: No  Pain: denies    Therapy/Group: Individual Therapy  ALorie Phenix11/17/2023, 8:07 AM

## 2022-10-11 NOTE — Consult Note (Signed)
Neuropsychological Consultation   Patient:   [name] [name]   DOB:   Nov 08, 1930  MR Number:  [id number]  Location:  [facility] [address] CENTER B [address] [id number] [place] [address] Dept: [phone] Loc: [phone]           Date of Service:   10/11/2022  Start Time:   8 AM End Time:   9 AM  Provider/Observer:  Arley Phenix, Psy.D.       Clinical Neuropsychologist       Billing Code/Service: [phone]  Chief Complaint:    [name] [name] is an 88 year old female with limited English speaking skills.  The patient was referred for neuropsychological consultation due to slow improvement with cognitive functioning and developing debility.  Patient with past medical history including history of left thalamic/internal capsule infarction in 2015 that was seen on the inpatient rehab unit at that time.  The patient has a past medical history including congestive heart failure, shortness of breath, hypertension, back pain, hyperlipidemia, diabetes, past seizure disorder/tremors has been maintained on Keppra.  Tremors with.  To be consistent with essential tremor upon visit today.  Patient presented on 09/16/2022 with generalized weakness over the past 2 days as well as increased shortness of breath.  Patient was scheduled for cardioversion but developed acute respiratory distress.  Patient had bouts of confusion but head CT showed no acute findings.  Once therapy evaluations were completed patient was admitted to CIR due to decreased functional mobility and functional deficits.  Reason for Service:  Patient was referred for neuropsychological consultation due to coping and adjustment issues and slowed improvement in cognitive functioning.  Below is the HPI for the current admission.  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  Per chart review patient lives with her children.  1 level home one-step to entry.  Uses a rolling walker for mobility working with outpatient physical therapy.  She has an aide assisting 1 time a week for bathing.  Presented 09/16/2022 with generalized weakness x2 days as well as increased shortness of breath.  Chest x-ray showed increased mild to moderate right and unchanged tiny left pleural effusions.  Chemistries unremarkable except glucose 114, GFR 56, troponin negative, BNP 465.  EKG showed atrial fibrillation with RVR and placed on intravenous Cardizem.  Echocardiogram with ejection fraction of 55 to 60% no wall motion abnormalities.   Patient initially scheduled for cardioversion by cardiology services but subsequently developed acute respiratory distress requiring noninvasive imaging treated with aggressive diuresis.  Course complicated by upper GI bleed due to gastric ulcer with follow-up gastroenterology services undergoing upper GI endoscopy 09/26/2022 per Dr. Chales Abrahams and placed on PPI twice daily x12 weeks.  Noted bouts of confusion initial restlessness CT of the head completed showing no acute findings 09/22/2022.  Patient stabilized underwent TEE/DCCV 09/24/2022 per cardiology services and currently maintained on amiodarone and Toprol as well as Cardizem/Lanoxin.  Venous Doppler studies lower extremities 10/02/2022 showed findings consistent with age-indeterminate DVT involving the right posterior tibial vein as well as CT angiogram of  the chest showing acute on chronic pulmonary embolus with bulk of  the filling defect in the pulmonary arterial tree peripheral compatible with chronic embolus..  Patient was cleared to begin intravenous heparin and transitioned to Eliquis.  IV diuresis transitioned to Demadex 20 mg p.o. daily.   Intermittent bouts of abdominal pain with CT of the abdomen 09/30/2022 showing mild gaseous distention of small bowel in the lower abdomen up to 3.5 cm suggesting possible ileus.  Her diet has been advanced to a mechanical soft.  She has had some elevated LFTs felt to be possibly related to amiodarone.  Hepatitis panel negative.  Urinalysis study 10/02/2022 with negative nitrite moderate leukocytosis with preliminary culture 80,000 gram-positive cocci and 30,000 gram-negative rod currently maintained on Omnicef transition to amoxicillin.  Therapy evaluations completed due to patient decreased functional mobility was admitted for a comprehensive rehab program.   Current Status:  Patient was awake and alert with her son in the room for visit.  Patient has very limited [place] language skills and her son provided much of the translation during our visit.  Patient spoke Farsi as her primary language.  Patient appeared to be oriented but I required interpretation for the the answers and could not directly verify her orientation.  Patient displayed continuous head tremor throughout the visit and also showed some hand tremor.  Patient's son reports that this has been going on for some time and patient has been diagnosed with essential tremor previously.  Patient and son both report that there have been improvements in her cognitive status but she continues to have slowed information processing speed but her level of confusion has improved significantly.  Patient denied significant depression or anxiety type symptoms.  Behavioral Observation: [name] [name]  presents as a 88 y.o.-year-old Right handed [name] Female who appeared her stated age. her dress was Appropriate and she was Well  Groomed and her manners were Appropriate to the situation.  her participation was indicative of Appropriate and Redirectable behaviors.  There were physical disabilities noted.  she displayed an appropriate level of cooperation and motivation.     Interactions:    Active Appropriate  Attention:   abnormal and attention span appeared shorter than expected for age  Memory:   abnormal; remote memory intact, recent memory impaired  Visuo-spatial:  not examined  Speech (Volume):  low  Speech:   normal; normal  Thought Process:  Coherent and Relevant  Though Content:  WNL; not suicidal and not homicidal  Orientation:   person, place, and situation  Judgment:   Fair  Planning:   Poor  Affect:    Appropriate  Mood:    Anxious  Insight:   Fair  Intelligence:   normal  Medical History:   Past Medical History:  Diagnosis Date   Anxiety    Diabetes ([place]) 10/27/2013   DVT (deep venous thrombosis) ([place])    Essential tremor 10/27/2013   High cholesterol 1999   Hypertension    Ischemic stroke ([place]) 07/18/2014   Obesity, unspecified 10/27/2013   Pneumonia    Unspecified hereditary and idiopathic peripheral neuropathy 10/27/2013         Patient Active Problem List   Diagnosis Date Noted   Acute on chronic pulmonary embolism 10/04/2022   Debility 10/04/2022   Bilateral pleural effusion 10/02/2022   Shortness of breath 10/01/2022   UTI (urinary tract infection) 09/30/2022   Abdominal pain 09/29/2022   Swelling of right upper extremity 09/29/2022   Anemia 09/28/2022   Bilateral  hip pain with RLE DVT 09/17/2022   Atrial fibrillation with RVR ([place]) 09/16/2022   Acute on chronic diastolic heart failure ([place]) 09/16/2022   Chronic pain of left knee 07/26/2022   Urinary incontinence 06/16/2022   Urinary frequency 12/06/2020   History of CVA (cerebrovascular accident) 04/26/2019   CHF (congestive heart failure), NYHA class III, chronic, diastolic ([place]) 123456   Hypoxemia  requiring supplemental oxygen 11/10/2018   Fatigue 11/10/2018   Osteopenia of multiple sites 11/10/2018   Back pain 10/10/2016   Seizures ([place]) 08/06/2014   Hemiplegia, unspecified, affecting dominant side 08/05/2014   Right sided weakness 08/05/2014   CVA (cerebral vascular accident) ([place]) 07/21/2014   HTN (hypertension) 07/18/2014   HLD (hyperlipidemia) 07/18/2014   Thrombocytopenia ([place]) 07/18/2014   Essential tremor 10/27/2013   Obesity, unspecified 10/27/2013   History of peripheral neuropathy 10/27/2013   Psychiatric History:  Patient with past medical history that includes anxiety symptoms.  Patient not taking any psychotropic medications and takes Keppra for prophylactic treatment of seizure disorder.  Family Med/Psych History:  Family History  Problem Relation Age of Onset   Stroke Mother    Heart Problems Sister    Heart Problems Brother    Coronary artery disease Other    Tremor Neg Hx     Impression/DX:  [name] [name] is an 88 year old female with limited English speaking skills.  The patient was referred for neuropsychological consultation due to slow improvement with cognitive functioning and developing debility.  Patient with past medical history including history of left thalamic/internal capsule infarction in 2015 that was seen on the inpatient rehab unit at that time.  The patient has a past medical history including congestive heart failure, shortness of breath, hypertension, back pain, hyperlipidemia, diabetes, past seizure disorder/tremors has been maintained on Keppra.  Tremors with.  To be consistent with essential tremor upon visit today.  Patient presented on 09/16/2022 with generalized weakness over the past 2 days as well as increased shortness of breath.  Patient was scheduled for cardioversion but developed acute respiratory distress.  Patient had bouts of confusion but head CT showed no acute findings.  Once therapy evaluations were completed patient was  admitted to CIR due to decreased functional mobility and functional deficits.  Disposition/Plan:  Today we worked on coping and adjustment issues and answered pertinent questions from both the patient as well as her son.  Diagnosis:    Debility         Electronically Signed   _______________________ Ilean Skill, Psy.D. Clinical Neuropsychologist

## 2022-10-12 DIAGNOSIS — I5033 Acute on chronic diastolic (congestive) heart failure: Secondary | ICD-10-CM

## 2022-10-12 DIAGNOSIS — M79604 Pain in right leg: Secondary | ICD-10-CM

## 2022-10-12 DIAGNOSIS — K922 Gastrointestinal hemorrhage, unspecified: Secondary | ICD-10-CM

## 2022-10-12 NOTE — Progress Notes (Signed)
PROGRESS NOTE   Subjective/Complaints: No new complaints this AM. She reports chronic RLE pain at night is controlled with tylenol.   ROS- denies CP, SOB,  abdominal pain, N/V/D, pain, +chronic tremor Chronic RLE pain  Objective:   No results found. No results for input(s): "WBC", "HGB", "HCT", "PLT" in the last 72 hours.  No results for input(s): "NA", "K", "CL", "CO2", "GLUCOSE", "BUN", "CREATININE", "CALCIUM" in the last 72 hours.   Intake/Output Summary (Last 24 hours) at 10/12/2022 1445 Last data filed at 10/12/2022 1317 Gross per 24 hour  Intake 360 ml  Output --  Net 360 ml         Physical Exam: Vital Signs Blood pressure (!) 129/56, pulse 72, temperature 98.6 F (37 C), temperature source Oral, resp. rate 15, height 5' (1.524 m), weight 75.8 kg, SpO2 96 %. General: No acute distress, BMI 32.89 Mood and affect are appropriate, lying in bed Heart: Regular rate and rhythm no rubs murmurs or extra sounds Lungs: Clear to auscultation, breathing unlabored, no rales or wheezes, non-labored Abdomen: Positive bowel sounds, soft nontender to palpation, nondistended Extremities: No clubbing, cyanosis, or edema Skin: No evidence of breakdown, no evidence of rash, IV heparin -discontinued Neurologic: Cranial nerves II through XII intact, motor strength is 5/5 in left and 3-/5 right  deltoid, bicep, tricep, grip, hip flexor, knee extensors, ankle dorsiflexor and plantar flexor Sensory exam hypersensitive to touch RLE  Cerebellar exam- intention tremor RUE, chronic tremor of head Musculoskeletal: Full range of motion in all 4 extremities. No joint swelling, no evidence of Right knee or ankle effusion no erythema  Ambulating MinA with RW   Assessment/Plan: 1. Functional deficits which require 3+ hours per day of interdisciplinary therapy in a comprehensive inpatient rehab setting. Physiatrist is providing close team  supervision and 24 hour management of active medical problems listed below. Physiatrist and rehab team continue to assess barriers to discharge/monitor patient progress toward functional and medical goals  Care Tool:  Bathing    Body parts bathed by patient: Right arm, Left arm, Chest, Abdomen, Right upper leg, Left upper leg, Face   Body parts bathed by helper: Front perineal area, Buttocks, Right lower leg, Left lower leg     Bathing assist Assist Level: Moderate Assistance - Patient 50 - 74%     Upper Body Dressing/Undressing Upper body dressing   What is the patient wearing?: Pull over shirt    Upper body assist Assist Level: Minimal Assistance - Patient > 75%    Lower Body Dressing/Undressing Lower body dressing      What is the patient wearing?: Pants     Lower body assist Assist for lower body dressing: Maximal Assistance - Patient 25 - 49%     Toileting Toileting    Toileting assist Assist for toileting: Moderate Assistance - Patient 50 - 74%     Transfers Chair/bed transfer  Transfers assist     Chair/bed transfer assist level: Minimal Assistance - Patient > 75%     Locomotion Ambulation   Ambulation assist   Ambulation activity did not occur: Safety/medical concerns (Unable to assess gait, stair mobility or picking up an item from the floor  secondary to decreased strength, increased tremors, impaired endurance/activity tolerance and poor dynamic stability- Therapist to assess this afternoon if able.)  Assist level: Minimal Assistance - Patient > 75% Assistive device: Walker-rolling Max distance: 22ft   Walk 10 feet activity   Assist  Walk 10 feet activity did not occur: Safety/medical concerns  Assist level: Minimal Assistance - Patient > 75% Assistive device: Walker-rolling   Walk 50 feet activity   Assist Walk 50 feet with 2 turns activity did not occur: Safety/medical concerns         Walk 150 feet activity   Assist Walk 150  feet activity did not occur: Safety/medical concerns         Walk 10 feet on uneven surface  activity   Assist Walk 10 feet on uneven surfaces activity did not occur: Safety/medical concerns   Assist level: 2 helpers Assistive device: Photographer Is the patient using a wheelchair?: Yes Type of Wheelchair: Manual    Wheelchair assist level: Total Assistance - Patient < 25% Max wheelchair distance: 10'- Therapist assisting with the remaining distance secondary to B UE tremors and poor endurance/activity tolerance.    Wheelchair 50 feet with 2 turns activity    Assist        Assist Level: Total Assistance - Patient < 25%   Wheelchair 150 feet activity     Assist      Assist Level: Total Assistance - Patient < 25%   Blood pressure (!) 129/56, pulse 72, temperature 98.6 F (37 C), temperature source Oral, resp. rate 15, height 5' (1.524 m), weight 75.8 kg, SpO2 96 %.  Medical Problem List and Plan: 1. Functional deficits secondary to debility/multi medical including left thalamic posterior limb infarction 2015 receiving inpatient rehab services 07/21/2014 - 07/30/2014             -patient may shower             -ELOS/Goals: 10-14 days  FULL CODE as per discussion with son  Discussed length of stay with family.   -Seen by Dr. Kieth Brightly yesterday 2.  Acute on chronic PE: Discussed with family that it can take time for these to break up and dissolve. -DVT/anticoagulation: Venous Doppler study 10/02/2022 findings consistent with age-indeterminate DVT right posterior tibial vein as well as CT angiogram of the chest showing acute on chronic pulmonary emboli.   Change IV heparin to low dose Eliquis as per GI - Per documentation will need serial [place] for RLE DVT on 11/13 and 11/22              -antiplatelet therapy: N/A   3. Pain Management: N/a, discuss with sons weaning medications if pain is well controlled. D/c Neurontin 100 mg nightly,  discontinue cymbalta since not having pain, Voltaren gel as needed. Provide list of foods for pain.   -Continue tylenol PRN for chronic RLE pain 4. Mood/Behavior/Sleep: Provide emotional support             -antipsychotic agents: N/A 5. Neuropsych/cognition: This patient is capable of making decisions on her own behalf. 6. Skin/Wound Care: Routine skin checks 7. Fluids/Electrolytes/Nutrition: Routine in and outs with follow-up chemistries 8.  GI bleed.  Status post upper GI endoscopy 09/26/2022.  Continue PPI twice daily. Cleared to restart Eliquis by GI  -11/18 Eliquis 2.5 mg restarted yesterday 9.  Acute respiratory failure requiring noninvasive imaging treated with aggressive diuresis.  Check oxygen saturations every shift. Wean oxygen as tolerated.  10.  Acute on chronic diastolic congestive heart failure.  Demadex 20 mg daily.  Monitor for any signs of fluid overload.  Daily weights. Weight is down, continue to monitor daily              - Patient was using supplemental oxygen as needed prior to admission. Filed Weights   10/11/22 0400 10/11/22 1523 10/12/22 0732  Weight: 74.8 kg 74.8 kg 75.8 kg  Weight relatively stable, a little up today, continue to monitor  11.  New onset atrial fibrillation with RVR.  Amiodarone 200 mg daily.  Follow-up cardiology service Dr. Doylene Canard.  Status post DCCV 09/24/2022.  Continue Cardizem 30 mg every 8 hours, Lanoxin 0.0625 mg daily, Toprol 50 mg daily. Magnesium level reviewed and is normal 12.  Elevated LFTs.  Felt to be possibly due to amiodarone.  Hepatitis panel negative follow-up labs. 13.  Seizure disorder.  Longstanding Keppra 500 mg twice daily, continue 14.  Hyperlipidemia.  Lipitor 15.  Ileus.  Resolving.  Diet advanced to mechanical soft. LBM <24 hours per son.  16.  Obesity.  BMI 33.20-->32.21:  Dietary follow-up 17.  History of type 2 diabetes mellitus, latest hemoglobin A1c 5.4.  18.  UTI.  80,000 gram-positive cocci/30,000 gram-negative  rod.  Initially on Omnicef transition to amoxicillin.  19. Hx essential tremor, severe. On longstanding primidone 250 mg BID, low-dose gabapentin, cymbalta, keppra.       - Per son, can perform ADLs including self feeding/grooming at home.       - Follows closely with OP neurology 20. Suboptimal vitamin D level: Level is 33, ergocalciferol 50,000U once per week for 7 weeks started. 21. Diastolic hypotension: discussed with Dr. Doylene Canard. EKG obtained and reviewed. Appreciate Dr. Doylene Canard seeing her. Amiodarone dose reduced by 50%    LOS: 8 days A FACE TO FACE EVALUATION WAS PERFORMED  Jennye Boroughs 10/12/2022, 2:45 PM

## 2022-10-12 NOTE — Progress Notes (Signed)
Physical Therapy Weekly Progress Note  Patient Details  Name: [name] [name] MRN: [id number] Date of Birth: 1930/11/05  Beginning of progress report period: October 05, 2022 End of progress report period: October 12, 2022  Today's Date: 10/12/2022 PT Individual Time: 1000-1054 PT Individual Time Calculation (min): 54 min   Patient has met 3 of 3 short term goals. Pt demonstrates gradual progress towards long term goals. Pt is able to transfer supine<>sitting EOB with close supervision and cues for technique, perform sit<>stand and stand<>pivot transfers with RW and min A, navigate 8 3in steps with bilateral handrails and min A, and ambulate up to 57f with RW and min A. Pt demonstrates improvements in confidence with decreased fear of falling, however continues to be limited by generalized weakness/deconditioning, fatigue, SOB, and poor activity tolerance. Language barriers also increase difficulty of carry over with cues for technique and safety. Pt's sons are present daily and would benefit from family education training closer to discharge.   Patient continues to demonstrate the following deficits muscle weakness, decreased cardiorespiratoy endurance, and decreased standing balance, decreased postural control, and decreased balance strategies and therefore will continue to benefit from skilled PT intervention to increase functional independence with mobility.  Patient progressing toward long term goals..  Continue plan of care.  PT Short Term Goals Week 1:  PT Short Term Goal 1 (Week 1): Patient will performed sit/stand with LRAD and MinA PT Short Term Goal 1 - Progress (Week 1): Met PT Short Term Goal 2 (Week 1): Patient will perform bed/chair transfer with LRAD and ModA x1 PT Short Term Goal 2 - Progress (Week 1): Met PT Short Term Goal 3 (Week 1): Patient will ambulate 168 with LRAD and ModA x1 PT Short Term Goal 3 - Progress (Week 1): Met Week 2:  PT Short Term Goal 1 (Week 2):  STG=LTG due to LOS  Skilled Therapeutic Interventions/Progress Updates:  Ambulation/gait training;Community reintegration;DME/adaptive equipment instruction;Neuromuscular re-education;Psychosocial support;Stair training;UE/LE Strength taining/ROM;Wheelchair propulsion/positioning;Balance/vestibular training;Discharge planning;Functional electrical stimulation;Pain management;Skin care/wound management;Therapeutic Activities;UE/LE Coordination activities;Cognitive remediation/compensation;Disease management/prevention;Functional mobility training;Patient/family education;Splinting/orthotics;Therapeutic Exercise;Visual/perceptual remediation/compensation   Today's interventions: Received pt semi-reclined in bed with 4 family members present at bedside. Pt agreeable to PT treatment and denied any pain during session. Session with emphasis on functional mobility/transfers, toileting, dressing, generalized strengthening and endurance, stair navigation, and gait training. Pt reported urge to use bathroom and transferred semi-reclined<>sitting EOB with HOB elevated and use of bedrails with supervision and mod cues for technique. Pt able to scoot to EOB with supervision and stood from elevated EOB with RW and light min A and ambulated in/out of bathroom with RW and min A - cues to keep forward inside RW. Pt able to void and performed hygiene management seated with supervision. Doffed dirty gowns and donned pull over shirt with mod A and clean brief/pants with max A. Stood with RW and light min A and required max/total A to pull brief/pants over hips. Donned shoes sitting in WC with max A.   [facility] for time management purposes. Pt stood pulling up on railings and navigated 8 3in steps with bilateral handrails and min A ascending and descending with a step to pattern - cues to bring hands forward along rail when descending and for safety when turning to sit in WC, as pt grabbing  onto windowsill and plopping into WC. Pt required extended rest break afterwards, then performed seated BLE strengthening on Kinetron at 20 cm/sec for 1 minute x 4 trials  with emphasis on glute/quad strength and cardiovascular endurance. Pt required cues to continue through full minute, as pt frequently stopping throughout interval. Pt stood with RW and min A and ambulated 53f x 1 and 321fx 1 with RW and CGA/min A. Pt required cues for safety when turning, as pt attempting to sit too early (instructed pt to back RW up to [facility] reach back prior to sitting). Returned to room and concluded session with pt sitting in [facility] all needs within reach and family present at bedside.   Therapy Documentation Precautions:  Precautions Precautions: Fall Precaution Comments: Monitor oxygen Restrictions Weight Bearing Restrictions: No  Therapy/Group: Individual Therapy AnAlfonse AlpersT, DPT  10/12/2022, 7:12 AM

## 2022-10-13 NOTE — Progress Notes (Signed)
Occupational Therapy Session Note  Patient Details  Name: [name] [name] MRN: [id number] Date of Birth: 1930-06-01  Today's Date: 10/13/2022 OT Individual Time: 0845-1000 1400-1505 OT Individual Time Calculation (min): 75 min 65 min   Short Term Goals: Week 1:  OT Short Term Goal 1 (Week 1): Pt will be Min A for LB dressing OT Short Term Goal 1 - Progress (Week 1): Not met OT Short Term Goal 2 (Week 1): Pt will be Mod A for toileting OT Short Term Goal 2 - Progress (Week 1): Not met OT Short Term Goal 3 (Week 1): Pt will be Min A for functional transfers OT Short Term Goal 3 - Progress (Week 1): Met OT Short Term Goal 4 (Week 1): Pt will be Supervision for UB dressing OT Short Term Goal 4 - Progress (Week 1): Met  Skilled Therapeutic Interventions/Progress Updates:   Session 1: Upon OT arrival, pt semi recumbent in bed reporting no pain and is agreeable to OT treatment. Treatment intervention with a focus on self care retraining, energy conservation, and endurance. Pt completes full shower ADL at the levels below. Pt HR up to 145 with exertion requiring frequent verbal cues to rest and perform pursed lip breathing. Pt performs functional mobility with RW with Min A-CGA. Pt was transported to dayroom via w/c and total A for time. While seated in w/c, pt retrieves 23 cards from vertical board using B UE, one at a time. Pt completes with min difficulty and requires short rest breaks to complete. Pt was transported back to her room via w/c and total A and left in the w/c with all needs met and safety measures in place.   Session 2: Upon OT arrival, pt semi recumbent in bed reporting back pain and is agreeable to OT treatment. Treatment intervention with a focus on functional transfers, functional mobility, dynamic standing balance, endurance, and strengthening, Pt completes supine to sit transfer with SBA and stand pivot transfer with CGA. Pt was transported to dayroom via w/c and total A for  energy conservation. Pt completes sit to stand transfer engaging in bean bag toss with the R UE and the L UE. Pt tosses all 14 bean bags without a rest break on the R UE but requires one seated rest break after completing 6 with the L UE. Pt performs functional mobility with CGA with RW to attempt to retrieve bean bags from the floor with reacher but was unsuccessful. Pt bends over to retrieve 4 bean bags but demonstrates decreased standing tolerance requiring manual assist to correct LOB and ambulates back to her w/c with CGA. Pt's heart rate up to 120s with exertion. Pt was transported via w/c to [employer] and sits in w/c to retrieve bean bags from the floor or board using B UE. Pt uses reacher to scoot bean bags closer to her. While seated at tabletop, pt flips over [employer] pieces using B UE with .75lb wrist weights on. Pt works at a slow but steady pace and requires min verbal cues to follow directions. Short rest and water breaks required. Pt was transported back to her room via w/c and pt requesting to use the bathroom. Pt completes stand pivot transfer to toilet with CGA and performs toileting with Min A. Pt was transported to her bed completing stand pivot transfer with bed with CGA and sit to supine transfer with Min A to reposition. Pt left in bed with all needs met, safety measures in place, and son at bedside.  Therapy Documentation Precautions:  Precautions Precautions: Fall Precaution Comments: Monitor oxygen Restrictions Weight Bearing Restrictions: No    ADL: ADL Eating: Contact guard Where Assessed-Eating: Bed level Grooming: Moderate assistance (pt washes face and daughter in law combs hair secondary to fatigue) Where Assessed-Grooming: Wheelchair, Other (comment) (shower) Upper Body Bathing: Setup Where Assessed-Upper Body Bathing: Shower Lower Body Bathing: Contact guard Where Assessed-Lower Body Bathing: Shower Upper Body Dressing: Setup Where Assessed-Upper Body  Dressing: Other (Comment) (shower) Lower Body Dressing: Moderate assistance (to donn socks manage brief) Where Assessed-Lower Body Dressing: Other (Comment) (shower) Toileting: Minimal assistance (to pull down brief) Where Assessed-Toileting: Glass blower/designer: Therapist, music Method: Human resources officer: Unable to assess Social research officer, government: Environmental education officer Method: Heritage manager: Radio broadcast assistant  Therapy/Group: Individual Therapy  Marvetta Gibbons 10/13/2022, 10:49 AM

## 2022-10-13 NOTE — Progress Notes (Signed)
Physical Therapy Session Note  Patient Details  Name: [name] [name] MRN: [id number] Date of Birth: 1929-12-23  Today's Date: 10/13/2022 PT Individual Time: 1100-1155 PT Individual Time Calculation (min): 55 min   Short Term Goals: Week 1:  PT Short Term Goal 1 (Week 1): Patient will performed sit/stand with LRAD and MinA PT Short Term Goal 1 - Progress (Week 1): Met PT Short Term Goal 2 (Week 1): Patient will perform bed/chair transfer with LRAD and ModA x1 PT Short Term Goal 2 - Progress (Week 1): Met PT Short Term Goal 3 (Week 1): Patient will ambulate 69' with LRAD and ModA x1 PT Short Term Goal 3 - Progress (Week 1): Met  Skilled Therapeutic Interventions/Progress Updates:    Pt seated in w/c on arrival and agreeable to therapy. No complaint of pain, but reports feeling tired after shower with OT this AM. Pt transported to therapy gym for time management and energy conservation.  Pt ambulated 2 x 25 ft with min A and w/c follow d/t fatigue. Pt with hunched posture and slightly shuffling gait pattern. Consistent cues for RW proximity and safety when turning to sit. Extended seated rest breaks d/t fatigue.  During session, pt requested to use bathroom. Returned to room and pt performed ambulatory transfer to commode  with min A and RW. Min A for clothing management, hygiene with supervision. Continent urine void. Pt then continued with session, ambulating 2 x 40 ft in the same manner as above for endurance and functional mobility. Pt participated in Sit to stand 3 x 4, requiring consistent encouragement to participate and extended rest breaks d/t fatigue. Pt also propelled w/c x 50 ft before stating she was too fatigued to continue. Pt returned to room and remained in w/c with family present, was left with all needs in reach and alarm active.   Therapy Documentation Precautions:  Precautions Precautions: Fall Precaution Comments: Monitor oxygen Restrictions Weight Bearing  Restrictions: No General:      Therapy/Group: Individual Therapy  Mickel Fuchs 10/13/2022, 11:18 AM

## 2022-10-14 ENCOUNTER — Inpatient Hospital Stay (HOSPITAL_COMMUNITY): Payer: Medicare Other

## 2022-10-14 LAB — CBC WITH DIFFERENTIAL/PLATELET
Abs Immature Granulocytes: 0.02 10*3/uL (ref 0.00–0.07)
Basophils Absolute: 0.1 10*3/uL (ref 0.0–0.1)
Basophils Relative: 1 %
Eosinophils Absolute: 0.1 10*3/uL (ref 0.0–0.5)
Eosinophils Relative: 2 %
HCT: 32 % — ABNORMAL LOW (ref 36.0–46.0)
Hemoglobin: 10.4 g/dL — ABNORMAL LOW (ref 12.0–15.0)
Immature Granulocytes: 0 %
Lymphocytes Relative: 31 %
Lymphs Abs: 1.7 10*3/uL (ref 0.7–4.0)
MCH: 29.2 pg (ref 26.0–34.0)
MCHC: 32.5 g/dL (ref 30.0–36.0)
MCV: 89.9 fL (ref 80.0–100.0)
Monocytes Absolute: 0.5 10*3/uL (ref 0.1–1.0)
Monocytes Relative: 10 %
Neutro Abs: 3.1 10*3/uL (ref 1.7–7.7)
Neutrophils Relative %: 56 %
Platelets: 195 10*3/uL (ref 150–400)
RBC: 3.56 MIL/uL — ABNORMAL LOW (ref 3.87–5.11)
RDW: 14.6 % (ref 11.5–15.5)
WBC: 5.5 10*3/uL (ref 4.0–10.5)
nRBC: 0 % (ref 0.0–0.2)

## 2022-10-14 NOTE — Progress Notes (Signed)
Occupational Therapy Session Note  Patient Details  Name: [name] [name] MRN: [id number] Date of Birth: 01/17/1930  Today's Date: 10/14/2022 OT Individual Time: 1418-1530 OT Individual Time Calculation (min): 72 min    Short Term Goals: Week 2:  OT Short Term Goal 1 (Week 2): STG = LTG due to ELOS  Skilled Therapeutic Interventions/Progress Updates:  Skilled OT intervention completed with focus on tub and walk in shower transfers, standing tolerance, BUE endurance. Pt received seated in w/c, agreeable to session. No pain reported.  Declined need of bathroom at start of session. Transported dependently in w/c <> ADL apartment/gym for time and energy conservation.  Pt with difficulty explaining current shower set up at home, but per son, has walk in shower with tub bench and shower curtain as well as tub/shower combo. Education provided on DME available such as tub bench that can be purchased for use at home if using tub shower. Advised use of [facility] for ease of bathing, minimizing stands via lateral leans for pericare, use of grab bars/installation as well as effective safety strategies for exiting the shower to eliminate falls. With demonstration, pt was able to complete supervision sit > stand using RW, then CGA stand pivot to tub bench and supervised pivot into tub with cues needed for BLE management. Supervision for pivoting out and CGA sit > stand to exit back to w/c.  Practiced walk in shower transfer x2 with small lip using simulated shower frame and shower chair. Required min A for RW management and BLE positioning over threshold, however with cues and education on side step method, pt able to complete at CGA level on 2nd trial.  Completed standing task during fruit assortment by type of fruit and then color at table to with supervision for sit > stand and CGA for standing balance with counter as LUE support. Required seated rest break in between trials and did need min to mod cueing  fading to supervision for cognitive task.  Completed the following BUE exercises with yellow theraband seated in w/c: -Shoulder horizontal abduction 2x10 -Shoulder flexion, x10 each arm -Shoulder external rotation x10  Back in room, pt completed supervision sit > stand using RW, CGA ambulatory toilet transfer to toilet. With time given, was able to complete all toileting with CGA this session for all 3 steps for void only! Ambulated with CGA to EOB and transitioned into bed with supervision. Pt remained in L sidelying with pillows provided for comfort under back, with bed alarm on/activated, family present and with all needs in reach at end of session.   Therapy Documentation Precautions:  Precautions Precautions: Fall Precaution Comments: Monitor oxygen Restrictions Weight Bearing Restrictions: No    Therapy/Group: Individual Therapy  Melvyn Novas, MS, OTR/L  10/14/2022, 3:39 PM

## 2022-10-14 NOTE — Progress Notes (Signed)
Physical Therapy Session Note  Patient Details  Name: [name] [name] MRN: [id number] Date of Birth: 05-19-1930  Today's Date: 10/14/2022 PT Individual Time: 1030-1055 and 1300-1343 PT Individual Time Calculation (min): 25 min and 43 min  Short Term Goals: Week 1:  PT Short Term Goal 1 (Week 1): Patient will performed sit/stand with LRAD and MinA PT Short Term Goal 1 - Progress (Week 1): Met PT Short Term Goal 2 (Week 1): Patient will perform bed/chair transfer with LRAD and ModA x1 PT Short Term Goal 2 - Progress (Week 1): Met PT Short Term Goal 3 (Week 1): Patient will ambulate 39' with LRAD and ModA x1 PT Short Term Goal 3 - Progress (Week 1): Met Week 2:  PT Short Term Goal 1 (Week 2): STG=LTG due to LOS  Skilled Therapeutic Interventions/Progress Updates:   Treatment Session 1 Received pt sitting in [facility] with family present at bedside. Pt agreeable to PT treatment and denied any pain. Session with emphasis on family education training, functional mobility/transfers, gait training, WC mobility, and toileting. In hallway, pt ambulated total of 84f with RW and CGA with 2 seated rest breaks. Trial 1: pt ambulated with PT and instructed family on hand placement, use of gait belt, and body mechanics to provide min A to stand [facility] Pt ambulated with son on trials 2 and 3 with daughter-in-law providing close WK[facility] Hospitalfollow. Educated family on cues to provide to pt (to reach back prior to sitting, to push up from W[facility] LLCarmrests when standing, and to remain close inside RW). Pt then performed WC mobility ~293fusing BUE and supervision back towards room with 4 rest breaks - emphasis on cardiovascular endurance. Pt then reported urge to void and stood from [facility]ith RW and CGA and ambulated in/out of bathroom with RW and CGA. Pt required mod A for clothing management and able to void and perform hygiene management without assist. Concluded session with pt sitting [facility] all needs within reach and family  present at bedside.   Treatment Session 2 Received pt semi-reclined in bed with family present at bedside. Pt agreeable to PT treatment and denied any pain but reported urge to void. Session with emphasis on functional mobility/transfers, toileting, generalized strengthening and endurance, dynamic standing balance/coordination, and gait training. Pt transferred semi-reclined<>sitting EOB with HOB elevated and use of bedrails with supervision. Stood from EOB with RW and CGA and ambulated in/out of bathroom with RW and CGA. Pt required mod A for clothing management. Pt able to void and perform peri-care with supervision.   Pt transported to/from [facility] for time management purposes. Pt stood with RW and CGA/close supervision from WCO[facility] Medical Centernd ambulated 5058fith RW and CGA! Pt required 1 standing rest break and cues for pursed lip breathing. In dayroom, pt performed seated BLE strengthening on Kinetron at 20 cm/sec for 1 minute x 4 trials with emphasis on glute/quad strength. Worked on blocked practice sit<>stands with RW x 5 reps with CGA/close supervision - emphasis on technique and hand placement when pushing up and reaching back for WC armrests. Pt then performed x15 LAQ and x15 hip flexion bilaterally with emphasis on LE strength/ROM during active seated rest break. Returned to room and concluded session with pt sitting in WC [facility] all needs within reach and family present at bedside. Briefly spoke with pt's granddaughter on phone regarding D/C planning, pt's CLOF, and recommendation for 24/7 supervision upon discharge.   Therapy Documentation Precautions:  Precautions Precautions: Fall  Precaution Comments: Monitor oxygen Restrictions Weight Bearing Restrictions: No  Therapy/Group: Individual Therapy Alfonse Alpers PT, DPT  10/14/2022, 7:02 AM

## 2022-10-14 NOTE — Progress Notes (Signed)
Patient ID: [name] [name], female   DOB: Dec 22, 1929, 88 y.o.   MRN: [id number]  Sw spoke with patient granddaughter, Gae Dry providing the selections of [facility]urb[facility] for [facility] and [facility]. Granddaughter requesting OP Palliative referral. SW will discuss with physician.  Ellin Saba [phone]

## 2022-10-14 NOTE — Progress Notes (Addendum)
PROGRESS NOTE   Subjective/Complaints: Patient's son asks whether she can be extended given her excellent progress Her daughter-in-law feels she has been pale and tired  ROS- denies CP, SOB,  N/V/D, pain, +chronic tremor  Objective:   No results found. Recent Labs    10/14/22 0744  WBC 5.5  HGB 10.4*  HCT 32.0*  PLT 195   No results for input(s): "NA", "K", "CL", "CO2", "GLUCOSE", "BUN", "CREATININE", "CALCIUM" in the last 72 hours.   Intake/Output Summary (Last 24 hours) at 10/14/2022 1053 Last data filed at 10/14/2022 0832 Gross per 24 hour  Intake 480 ml  Output --  Net 480 ml        Physical Exam: Vital Signs Blood pressure (!) 122/50, pulse 77, temperature 98.3 F (36.8 C), resp. rate 16, height 5' (1.524 m), weight 75.9 kg, SpO2 95 %. General: No acute distress, BMI 32.68 Mood and affect are appropriate Heart: Regular rate and rhythm no rubs murmurs or extra sounds HEENT: tremor of head Lungs: Clear to auscultation, breathing unlabored, no rales or wheezes Abdomen: Positive bowel sounds, soft nontender to palpation, nondistended Extremities: No clubbing, cyanosis, or edema Skin: No evidence of breakdown, no evidence of rash, IV heparin attached Neurologic: Cranial nerves II through XII intact, motor strength is 5/5 in left and 3-/5 right  deltoid, bicep, tricep, grip, hip flexor, knee extensors, ankle dorsiflexor and plantar flexor Sensory exam hypersensitive to touch RLE  Cerebellar exam- intention tremor RUE, chronic tremor of head Musculoskeletal: Full range of motion in all 4 extremities. No joint swelling, no evidence of Right knee or ankle effusion no erythema  Ambulating MinA with RW   Assessment/Plan: 1. Functional deficits which require 3+ hours per day of interdisciplinary therapy in a comprehensive inpatient rehab setting. Physiatrist is providing close team supervision and 24 hour  management of active medical problems listed below. Physiatrist and rehab team continue to assess barriers to discharge/monitor patient progress toward functional and medical goals  Care Tool:  Bathing    Body parts bathed by patient: Right arm, Left arm, Chest, Abdomen, Right upper leg, Left upper leg, Face   Body parts bathed by helper: Front perineal area, Buttocks, Right lower leg, Left lower leg     Bathing assist Assist Level: Moderate Assistance - Patient 50 - 74%     Upper Body Dressing/Undressing Upper body dressing   What is the patient wearing?: Pull over shirt    Upper body assist Assist Level: Minimal Assistance - Patient > 75%    Lower Body Dressing/Undressing Lower body dressing      What is the patient wearing?: Pants     Lower body assist Assist for lower body dressing: Maximal Assistance - Patient 25 - 49%     Toileting Toileting    Toileting assist Assist for toileting: Moderate Assistance - Patient 50 - 74%     Transfers Chair/bed transfer  Transfers assist     Chair/bed transfer assist level: Minimal Assistance - Patient > 75%     Locomotion Ambulation   Ambulation assist   Ambulation activity did not occur: Safety/medical concerns (Unable to assess gait, stair mobility or picking up an item from  the floor secondary to decreased strength, increased tremors, impaired endurance/activity tolerance and poor dynamic stability- Therapist to assess this afternoon if able.)  Assist level: Minimal Assistance - Patient > 75% Assistive device: Walker-rolling Max distance: 56ft   Walk 10 feet activity   Assist  Walk 10 feet activity did not occur: Safety/medical concerns  Assist level: Minimal Assistance - Patient > 75% Assistive device: Walker-rolling   Walk 50 feet activity   Assist Walk 50 feet with 2 turns activity did not occur: Safety/medical concerns         Walk 150 feet activity   Assist Walk 150 feet activity did not  occur: Safety/medical concerns         Walk 10 feet on uneven surface  activity   Assist Walk 10 feet on uneven surfaces activity did not occur: Safety/medical concerns   Assist level: 2 helpers Assistive device: Aeronautical engineer Is the patient using a wheelchair?: Yes Type of Wheelchair: Manual    Wheelchair assist level: Total Assistance - Patient < 25% Max wheelchair distance: 10'- Therapist assisting with the remaining distance secondary to B UE tremors and poor endurance/activity tolerance.    Wheelchair 50 feet with 2 turns activity    Assist        Assist Level: Total Assistance - Patient < 25%   Wheelchair 150 feet activity     Assist      Assist Level: Total Assistance - Patient < 25%   Blood pressure (!) 122/50, pulse 77, temperature 98.3 F (36.8 C), resp. rate 16, height 5' (1.524 m), weight 75.9 kg, SpO2 95 %.  Medical Problem List and Plan: 1. Functional deficits secondary to debility/multi medical including left thalamic posterior limb infarction 2015 receiving inpatient rehab services 07/21/2014 - 07/30/2014             -patient may shower             -ELOS/Goals: 10-14 days  FULL CODE as per discussion with son  Discussed extension with Vicente Males and [facility] as per familly's request, but patient is likely to meet supervision goals by Sunday  Discussed length of stay with family.   Updated family 2.  Acute on chronic PE: Discussed with family that it can take time for these to break up and dissolve. -DVT/anticoagulation: Venous Doppler study 10/02/2022 findings consistent with age-indeterminate DVT right posterior tibial vein as well as CT angiogram of the chest showing acute on chronic pulmonary emboli.   Change IV heparin to low dose Eliquis as per GI - Per documentation will need serial [place] for RLE DVT on 11/13 and 11/22              -antiplatelet therapy: N/A   3. Pain Management: N/a, discuss with sons weaning  medications if pain is well controlled. D/c Neurontin 100 mg nightly, discontinue cymbalta since not having pain, Voltaren gel as needed. Provide list of foods for pain. Continue Tylenol. 4. Mood/Behavior/Sleep: Provide emotional support             -antipsychotic agents: N/A 5. Neuropsych/cognition: This patient is capable of making decisions on her own behalf. 6. Skin/Wound Care: Routine skin checks 7. Fluids/Electrolytes/Nutrition: Routine in and outs with follow-up chemistries 8.  GI bleed.  Status post upper GI endoscopy 09/26/2022.  Continue PPI twice daily. Cleared to restart Eliquis by GI. Discussed increased risk of bleeding.  9.  Acute respiratory failure requiring noninvasive imaging treated with aggressive diuresis.  Check oxygen saturations every shift. Wean oxygen as tolerated.  10.  Acute on chronic diastolic congestive heart failure.  Continue Demadex 20 mg daily.  Monitor for any signs of fluid overload.  Daily weights. Weight is down, continue to monitor daily              - Patient was using supplemental oxygen as needed prior to admission. 11.  New onset atrial fibrillation with RVR.  Amiodarone 200 mg daily.  Follow-up cardiology service Dr. Doylene Canard.  Status post DCCV 09/24/2022.  Continue Cardizem 30 mg every 8 hours, Lanoxin 0.0625 mg daily, Toprol 50 mg daily. Magnesium level reviewed and is normal 12.  Elevated LFTs.  Felt to be possibly due to amiodarone.  Hepatitis panel negative follow-up labs. 13.  Seizure disorder.  Longstanding Keppra 500 mg twice daily, continue 14.  Hyperlipidemia.  Lipitor 15.  Ileus.  Resolving.  Diet advanced to mechanical soft. LBM <24 hours per son.  16.  Obesity.  BMI 33.20-->32.21:  Dietary follow-up 17.  History of type 2 diabetes mellitus, latest hemoglobin A1c 5.4.  18.  UTI.  80,000 gram-positive cocci/30,000 gram-negative rod.  Initially on Omnicef transition to amoxicillin.  19. Hx essential tremor, severe. On longstanding primidone 250  mg BID, low-dose gabapentin, cymbalta, keppra.       - Per son, can perform ADLs including self feeding/grooming at home.       - Follows closely with OP neurology 20. Suboptimal vitamin D level: Level is 33, ergocalciferol 50,000U once per week for 7 weeks started. 21. Diastolic hypotension: discussed with Dr. Doylene Canard. EKG obtained and reviewed. Appreciate Dr. Doylene Canard seeing her. Amiodarone dose reduced by 50% 22. History of right foot fracture with current pain: will order XR.     LOS: 10 days A FACE TO FACE EVALUATION WAS PERFORMED  Clide Deutscher Baudelio Karnes 10/14/2022, 10:53 AM

## 2022-10-14 NOTE — Progress Notes (Addendum)
Occupational Therapy Session Note  Patient Details  Name: [name] [name] MRN: [id number] Date of Birth: 12-07-1929  Today's Date: 10/14/2022 OT Individual Time: 0845-1000 OT Individual Time Calculation (min): 75 min    Short Term Goals: Week 2:  OT Short Term Goal 1 (Week 2): STG = LTG due to ELOS  Skilled Therapeutic Interventions/Progress Updates:  Pt received supine in bed for skilled OT session with focus on ADL retraining. Pt agreeable to interventions, demonstrating an overall pleasant mood. Pt did not verbalize pain level, but pointed at L leg when asked about pain. OT offered intermediate rest breaks and positioning suggestions throughout session to address pain/fatigue and maximize participation/safety in session.   Pt completed sup>EOB with HHA+ use of bed rail and HOB elevated ~25 degrees. Pt then ambulated into bathroom with CGA+RW and verbal cuing for encouragement. Pt stands at [facility], requiring Vcs to doff brief before sitting down, doffing brief with Mod A due to fatigue and need to sit. While seated on BSC, pt completes LB dressing, requiring heavy verbal cuing for participation + Mod A for brief management and threading of scrub pants. Pt stands to don pants over bottom with A for posterior donning, needing 1 seated rest break before walking back to [facility] and cues for purse-lipped breathing. Pt dons scrup top and brushes hair seated on WC with increased time and verbal encouragement.   Pt then transport to therapy gym. In therapy gym, pt completed two trials of simulated lower body dressing with use of yellow thera-band, demonstrating carryover of hand placement on RW (alternating unilateral grasp) while performing task during second trial. Pt observed to benefit from this technique due to decreased balance and intentional tremors.   Substantial family education provided to pt's daughter in law regarding pt's discharge date and treatment progression/goals.  Pt remained seated in  [facility] with all immediate needs meet at end of session. Pt continues to be appropriate for skilled OT intervention to promote further functional independence.    Therapy Documentation Precautions:  Precautions Precautions: Fall Precaution Comments: Monitor oxygen Restrictions Weight Bearing Restrictions: No   Therapy/Group: Individual Therapy  Lou Cal, OTR/L, MSOT   10/14/2022, 7:55 AM

## 2022-10-15 ENCOUNTER — Inpatient Hospital Stay (HOSPITAL_COMMUNITY): Payer: Medicare Other

## 2022-10-15 DIAGNOSIS — K567 Ileus, unspecified: Secondary | ICD-10-CM

## 2022-10-15 DIAGNOSIS — M25561 Pain in right knee: Secondary | ICD-10-CM

## 2022-10-15 DIAGNOSIS — M79671 Pain in right foot: Secondary | ICD-10-CM

## 2022-10-15 DIAGNOSIS — Z7189 Other specified counseling: Secondary | ICD-10-CM

## 2022-10-15 DIAGNOSIS — Z66 Do not resuscitate: Secondary | ICD-10-CM

## 2022-10-15 DIAGNOSIS — Z515 Encounter for palliative care: Secondary | ICD-10-CM

## 2022-10-15 DIAGNOSIS — G8929 Other chronic pain: Secondary | ICD-10-CM

## 2022-10-15 MED ORDER — POLYETHYLENE GLYCOL 3350 17 G PO PACK
17.0000 g | PACK | Freq: Two times a day (BID) | ORAL | Status: DC
  Administered 2022-10-16 – 2022-10-22 (×12): 17 g via ORAL
  Filled 2022-10-15 (×14): qty 1

## 2022-10-15 NOTE — Progress Notes (Signed)
Physical Therapy Session Note  Patient Details  Name: [name] [name] MRN: [id number] Date of Birth: Aug 24, 1930  Today's Date: 10/15/2022 PT Individual Time: Session 1: 0916-1000    Session 2: 4462-8638 PT Individual Time Calculation (min):  Session 1: 44 min        Session 2: 40 mins  Short Term Goals: Week 1:  PT Short Term Goal 1 (Week 1): Patient will performed sit/stand with LRAD and MinA PT Short Term Goal 1 - Progress (Week 1): Met PT Short Term Goal 2 (Week 1): Patient will perform bed/chair transfer with LRAD and ModA x1 PT Short Term Goal 2 - Progress (Week 1): Met PT Short Term Goal 3 (Week 1): Patient will ambulate 8' with LRAD and ModA x1 PT Short Term Goal 3 - Progress (Week 1): Met  Skilled Therapeutic Interventions/Progress Updates:  Session 1: Chart reviewed and pt agreeable to therapy. Pt received seated in WC with no c/o pain and family present. Session focused on ambulation, family training, and balance to promote safe home access. Pt initiated session with 7 trails of amb at 33f-50ft using CGA + RW. Of note, family provided assistance for 3 amb trials and required min VC for guarding and WC management. Pt then returned to room and required CGA for transfer to toilet and donning/doffing lower body clothes. Pt then completed 4x20sec stands with no handhold and close supervision. At end of session, [facility], alarm engaged, nurse call bell and all needs in reach.   Session 2: Chart reviewed and pt agreeable to therapy. Pt received seated in WC with no c/o pain. Session focused on endurance training to promote activity tolerance for home mobility. Pt initiated session with transfer to toilet using CGA + RW and donning/doffing clothing. Pt was then transferred to therapy gym for time conservation. Pt completed 12 mins endurance training on NuSteps at 153m activity+20sec rest. Pt also completed 2 chair<>WC transfers during session with  close supervision + RW. [facility] and family discussed home exercise options. At end of session, pt was [facility], alarm engaged, nurse call bell and all needs in reach.    Therapy Documentation Precautions:  Precautions Precautions: Fall Precaution Comments: Monitor oxygen Restrictions Weight Bearing Restrictions: No   Therapy/Group: Individual Therapy  KiMarquette OldPT, DPT 10/15/2022, 4:04 PM

## 2022-10-15 NOTE — Consult Note (Cosign Needed)
Palliative Care Consult Note                                  Date: 10/15/2022   Patient Name: [name] [name]  DOB: 1930/03/28  MRN: [id number]  Age / Sex: 88 y.o., female  PCP: [name], MD Referring Physician: Horton Chin, MD  Reason for Consultation: Establishing goals of care  HPI/Patient Profile: 88 y.o. female  with past medical history of seizure disorder, essential tremor, HTN, HLD, and previous CVA who initially presented to [facility] on 09/16/22 with leg weakness and shortness of breath.  She was found to have new onset atrial fibrillation with RVR.  Hospitalization was complicated by acute hypoxic respiratory failure as well as GI bleeding.  However she ultimately stabilized and discharged to acute inpatient rehab on 10/04/2022.   Past Medical History:  Diagnosis Date   Anxiety    Diabetes (HCC) 10/27/2013   DVT (deep venous thrombosis) (HCC)    Essential tremor 10/27/2013   High cholesterol 1999   Hypertension    Ischemic stroke (HCC) 07/18/2014   Obesity, unspecified 10/27/2013   Pneumonia    Unspecified hereditary and idiopathic peripheral neuropathy 10/27/2013    Subjective:   I have reviewed medical records including progress notes, labs and imaging. Patient is known to Palliative Medicine from her recent hospitalization. Per chart review, it appears that patient has been progressing toward her goals while in rehab.  I assessed the patient at bedside.  She is alert and interactive.  Her son [name] and 2 daughters are at bedside.   Family tells me that patient has been making good progress while in rehab. They understand she may be discharged soon, although they were hoping her time can be extended.  I also spoke with granddaughter [name] by phone to review diagnosis, prognosis, goals of care, and disposition.  I re-introduced myself and the role of Palliative Medicine. During patient's previous hospitalization,  Gae Dry was the designated point of contact for the family   Values and goals of care important to patient and family were reviewed. Discussed acute inpatient rehab was pursued with the goal of improving functional status enough for patient to ultimately return home with family. Gae Dry confirms that patient and family want to avoid rehospitalization.   I offered and explained outpatient palliative services to offer ongoing support to patient and family, promote quality of life, and continue goals of care discussions as needed. Discussed that outpatient palliative can also assist with transition to hospice if patient's condition declines. Gae Dry is agreeable to outpatient palliative referral and states her preference for [facility] (through [facility]).  We reviewed code status.  During her recent hospitalization, patient was made DNR/DNI and all family members were in agreement with this decision.  I informed [name] that patient was currently listed as full code. [name] states this must be a mistake and is very clear that her grandmother should be DNR/DNI.  She states all family has been in agreement with this.  She thinks that something must have been "lost in translation" when full code status was entered upon admission to rehab.   Objective:   Primary Diagnoses: Present on Admission:  Debility   Physical Exam Vitals reviewed.  Constitutional:      General: She is not in acute distress.    Comments: Frail, chronically ill-appearing  Pulmonary:     Effort: Pulmonary effort is normal.  Neurological:     Mental Status: She is alert.     Motor: Weakness present.     Vital Signs:  BP (!) 130/47 (BP Location: Left Arm)   Pulse 62   Temp 97.9 F (36.6 C) (Oral)   Resp 18   Ht 5' (1.524 m)   Wt 74.7 kg   SpO2 100%   BMI 32.16 kg/m   Palliative Assessment/Data: PPS 40-50%     Assessment & Plan:   SUMMARY OF RECOMMENDATIONS   Code status changed to  DNR/DNI Continue current interventions Goal of care is ultimately to return home Patient/family wishes to avoid rehospitalization Outpatient palliative referral - (through [facility])  Primary Decision Maker: PATIENT with supportive family Granddaughter Jeneen Montgomery is point of contact for family for any major decisions  Prognosis:  Unable to determine  Discharge Planning:  With home health and outpatient palliative    Thank you for allowing [place] to participate in the care of [name]   Signed by: Elie Confer, NP Palliative Medicine Team  Team Phone # [phone]  For individual providers, please see AMION

## 2022-10-15 NOTE — Progress Notes (Signed)
Reached out to oncall palliative care Alvester Morin) and provider on call Dois Davenport [facility]). Order for pt to be full code status until all family members on board with same decision. Son (next of kin) verbalized his wish for his mother to be full code. Per palliative note, Deanna (grandaughter) wants pt to be DNR. Message sent to Richrd Humbles (palliative NP) who placed DNR order to clarify code status with all family members.  Mylo Red, LPN

## 2022-10-15 NOTE — Progress Notes (Signed)
Reviewed code status change with daughter in law patty. Patty wishes for pt change in code status to be discussed with the son [name] (power of attorney) once he arrives this evening. Will notify oncoming nurse. Mylo Red, LPN

## 2022-10-15 NOTE — Progress Notes (Signed)
Occupational Therapy Session Note  Patient Details  Name: [name] [name] MRN: [id number] Date of Birth: [birth year]  Today's Date: 10/15/2022 OT Individual Time: 1005-1100 & [id number] OT Individual Time Calculation (min): 55 min & 42 min   Short Term Goals: Week 2:  OT Short Term Goal 1 (Week 2): STG = LTG due to ELOS  Skilled Therapeutic Interventions/Progress Updates:  Session 1 Skilled OT intervention completed with focus on dynamic balance, weight shifting in prep for functional ambulation/transfers, standing tolerance, ambulatory endurance. Pt received seated in w/c, agreeable to session. No pain reported.  Completed supervision sit > stand using RW then ambulatory transfer to EOM with CGA. Seated EOM, pt completed series of sit > stands using RW with supervision, and maintained stance with CGA during weight shifting using RW with tapping called out colored dots with alternating BLE. Did require several seated rest breaks due to fatigue and poor respiratory endurance.  At the sit > stand level pt was able to retrieve clips from buttock region with CGA for balance, with 1 seated rest break needed in between grabbing due to fatigue. Seated pt was able to use figure 4 position about 3/4 the way to retrieve clips on socks.   Seated EOM, pt completed the following exercises with yellow theraband: -horizontal abduction x10 -Shoulder external rotation x10 -Bicep flexion, x10 each arm  Ambulated with CGA with RW about 10 ft with w/c brought behind for fatigue, with pt warning therapist when need of sitting prior to just sitting indicating carryover of safety education. Ambulated at same assist about 20 ft before pt indicated "enough, very tired."  Transported rest of way back to room due to fatigue. Therapist updated transfer safety sheet to ambulatory using RW with +1 A for toilet transfers to prep for type of toilet transfer completed at home as well as for increased activity during day  with nursing. Nursing notified.  Pt remained seated in w/c, with family present and with all needs in reach at end of session.  Session 2 Skilled OT intervention completed with focus on toileting needs and education, BUE endurance and IADL management. Pt received supine in bed, agreeable to session. No pain reported.  Upon CGA bed mobility to EOB, pt was noted to be heavily incontinent of void with pt unaware. Supervision sit > stand using RW and CGA ambulatory transfer to [facility] over toilet. Daughter in law incorporated into toileting session to observe pt's abilities due to cultural boundaries when son is present. Pt was able to doff pants/brief in stance with supervision, then continent of void on commode. Education provided to family on prepping for full supervision toileting, including providing items such as brief on counter beside toilet for pt to access as well as clean pants and hygiene wipes, as therapist anticipates pt will be unable to meet mod I level and son has barrier of attending pt closely in the bathroom and pt will need items for occasional incontinence. DIL was receptive and nurse notified of more frequent use of bathroom. Able to thread new brief with it pre-looped with supervision, donn pants over hips with supervision then CGA ambulatory transfer to w/c in room.  Transported dependently in w/c <> room for fatigue. Seated at low table, with 2.5 pound wrist weights applied to each wrist, pt folded series of wash cloths and towels for BUE endurance and to simulate laundry responsibility at home.  Pt remained seated in w/c to visit with family and with all needs in reach at end  of session.   Therapy Documentation Precautions:  Precautions Precautions: Fall Precaution Comments: Monitor oxygen Restrictions Weight Bearing Restrictions: No    Therapy/Group: Individual Therapy  Melvyn Novas, MS, OTR/L  10/15/2022, 2:48 PM

## 2022-10-15 NOTE — Progress Notes (Signed)
PROGRESS NOTE   Subjective/Complaints: Reports she continues to have RLE pain. She says pain is more R midshin and knee. No additional concerns.    ROS- denies CP, SOB, cough,  N/V/D, pain, +chronic tremor RLE pain  Objective:   DG Foot 2 Views Right  Result Date: 10/14/2022 CLINICAL DATA:  Right foot pain. EXAM: RIGHT FOOT - 2 VIEW COMPARISON:  Right ankle radiographs 07/08/2013 FINDINGS: Postsurgical changes of partial resection of the distal medial aspect of the great toe metatarsal with a pin traversing a distal metatarsal neck remote healed osteotomy. Moderate second PIP and mild rest of the second through fifth interphalangeal joint space narrowing. Tiny plantar calcaneal heel spur. Minimal chronic enthesopathic change at the Achilles insertion on the calcaneus. Mild-to-moderate distal medial malleolar degenerative spurring. No acute fracture is seen. No dislocation. IMPRESSION: Remote great toe metatarsal bunionectomy/osteotomy. Moderate degenerative changes of the second toe PIP joint. No acute fracture or dislocation. Electronically Signed   By: Yvonne Kendall M.D.   On: 10/14/2022 13:02   Recent Labs    10/14/22 0744  WBC 5.5  HGB 10.4*  HCT 32.0*  PLT 195    No results for input(s): "NA", "K", "CL", "CO2", "GLUCOSE", "BUN", "CREATININE", "CALCIUM" in the last 72 hours.   Intake/Output Summary (Last 24 hours) at 10/15/2022 1311 Last data filed at 10/15/2022 1305 Gross per 24 hour  Intake 600 ml  Output --  Net 600 ml         Physical Exam: Vital Signs Blood pressure (!) 130/47, pulse 62, temperature 97.9 F (36.6 C), temperature source Oral, resp. rate 18, height 5' (1.524 m), weight 74.7 kg, SpO2 100 %. General: No acute distress, BMI 32.68 Mood and affect are appropriate Heart: Regular rate and rhythm no rubs murmurs or extra sounds HEENT: tremor of head Lungs: Clear to auscultation, breathing unlabored,  no rales or wheezes Abdomen: Positive bowel sounds, soft nontender to palpation, nondistended Extremities: No clubbing, cyanosis, or edema Skin: No evidence of breakdown, no evidence of rash, IV heparin attached Neurologic: Cranial nerves II through XII intact, motor strength is 5/5 in left and 3-/5 right  deltoid, bicep, tricep, grip, hip flexor, knee extensors, ankle dorsiflexor and plantar flexor Sensory exam hypersensitive to touch RLE  Cerebellar exam- intention tremor RUE, chronic tremor of head Musculoskeletal: Full range of motion in all 4 extremities. No joint swelling, no evidence of Right knee or ankle effusion no erythema. Tenderness around R knee Ambulating MinA with RW   Assessment/Plan: 1. Functional deficits which require 3+ hours per day of interdisciplinary therapy in a comprehensive inpatient rehab setting. Physiatrist is providing close team supervision and 24 hour management of active medical problems listed below. Physiatrist and rehab team continue to assess barriers to discharge/monitor patient progress toward functional and medical goals  Care Tool:  Bathing    Body parts bathed by patient: Right arm, Left arm, Chest, Abdomen, Right upper leg, Left upper leg, Face   Body parts bathed by helper: Front perineal area, Buttocks, Right lower leg, Left lower leg     Bathing assist Assist Level: Moderate Assistance - Patient 50 - 74%     Upper Body Dressing/Undressing  Upper body dressing   What is the patient wearing?: Pull over shirt    Upper body assist Assist Level: Set up assist    Lower Body Dressing/Undressing Lower body dressing      What is the patient wearing?: Pants, Underwear/pull up     Lower body assist Assist for lower body dressing: Moderate Assistance - Patient 50 - 74%     Toileting Toileting    Toileting assist Assist for toileting: Contact Guard/Touching assist     Transfers Chair/bed transfer  Transfers assist     Chair/bed  transfer assist level: Contact Guard/Touching assist     Locomotion Ambulation   Ambulation assist   Ambulation activity did not occur: Safety/medical concerns (Unable to assess gait, stair mobility or picking up an item from the floor secondary to decreased strength, increased tremors, impaired endurance/activity tolerance and poor dynamic stability- Therapist to assess this afternoon if able.)  Assist level: Contact Guard/Touching assist Assistive device: Walker-rolling Max distance: 80ft   Walk 10 feet activity   Assist  Walk 10 feet activity did not occur: Safety/medical concerns  Assist level: Contact Guard/Touching assist Assistive device: Walker-rolling   Walk 50 feet activity   Assist Walk 50 feet with 2 turns activity did not occur: Safety/medical concerns  Assist level: Contact Guard/Touching assist Assistive device: Walker-rolling    Walk 150 feet activity   Assist Walk 150 feet activity did not occur: Safety/medical concerns         Walk 10 feet on uneven surface  activity   Assist Walk 10 feet on uneven surfaces activity did not occur: Safety/medical concerns   Assist level: 2 helpers Assistive device: Photographer Is the patient using a wheelchair?: Yes Type of Wheelchair: Manual    Wheelchair assist level: Supervision/Verbal cueing Max wheelchair distance: 26ft    Wheelchair 50 feet with 2 turns activity    Assist        Assist Level: Total Assistance - Patient < 25%   Wheelchair 150 feet activity     Assist      Assist Level: Total Assistance - Patient < 25%   Blood pressure (!) 130/47, pulse 62, temperature 97.9 F (36.6 C), temperature source Oral, resp. rate 18, height 5' (1.524 m), weight 74.7 kg, SpO2 100 %.  Medical Problem List and Plan: 1. Functional deficits secondary to debility/multi medical including left thalamic posterior limb infarction 2015 receiving inpatient rehab  services 07/21/2014 - 07/30/2014             -patient may shower             -ELOS/Goals: 10-14 days  FULL CODE as per discussion with son  Discussed extension with Tobi Bastos and [facility] as per familly's request, but patient is likely to meet supervision goals by Sunday  Discussed length of stay with family.   Updated family 2.  Acute on chronic PE: Discussed with family that it can take time for these to break up and dissolve. -DVT/anticoagulation: Venous Doppler study 10/02/2022 findings consistent with age-indeterminate DVT right posterior tibial vein as well as CT angiogram of the chest showing acute on chronic pulmonary emboli.   Change IV heparin to low dose Eliquis as per GI - Per documentation will need serial [place] for RLE DVT on 11/13 and 11/22              -antiplatelet therapy: N/A   3. Pain Management: N/a, discuss with sons weaning medications if  pain is well controlled. D/c Neurontin 100 mg nightly, discontinue cymbalta since not having pain, Voltaren gel as needed. Provide list of foods for pain. Continue Tylenol. 4. Mood/Behavior/Sleep: Provide emotional support             -antipsychotic agents: N/A 5. Neuropsych/cognition: This patient is capable of making decisions on her own behalf. 6. Skin/Wound Care: Routine skin checks 7. Fluids/Electrolytes/Nutrition: Routine in and outs with follow-up chemistries 8.  GI bleed.  Status post upper GI endoscopy 09/26/2022.  Continue PPI twice daily. Cleared to restart Eliquis by GI. Discussed increased risk of bleeding.  9.  Acute respiratory failure requiring noninvasive imaging treated with aggressive diuresis.  Check oxygen saturations every shift. Wean oxygen as tolerated.  10.  Acute on chronic diastolic congestive heart failure.  Continue Demadex 20 mg daily.  Monitor for any signs of fluid overload.  Daily weights. Weight is down, continue to monitor daily              - Patient was using supplemental oxygen as needed prior to admission.   Filed  Weights   10/12/22 0732 10/14/22 0500 10/15/22 0552  Weight: 75.8 kg 75.9 kg 74.7 kg  Weight stable, continue to monitor  11.  New onset atrial fibrillation with RVR.  Amiodarone 200 mg daily.  Follow-up cardiology service Dr. Doylene Canard.  Status post DCCV 09/24/2022.  Continue Cardizem 30 mg every 8 hours, Lanoxin 0.0625 mg daily, Toprol 50 mg daily. Magnesium level reviewed and is normal 12.  Elevated LFTs.  Felt to be possibly due to amiodarone.  Hepatitis panel negative follow-up labs. 13.  Seizure disorder.  Longstanding Keppra 500 mg twice daily, continue 14.  Hyperlipidemia.  Lipitor 15.  Ileus.  Resolving.  Diet advanced to mechanical soft. LBM <24 hours per son.  -11/21 Increase miralax to BID 16.  Obesity.  BMI 33.20-->32.21:  Dietary follow-up 17.  History of type 2 diabetes mellitus, latest hemoglobin A1c 5.4.  18.  UTI.  80,000 gram-positive cocci/30,000 gram-negative rod.  Initially on Omnicef transition to amoxicillin.  19. Hx essential tremor, severe. On longstanding primidone 250 mg BID, low-dose gabapentin, cymbalta, keppra.       - Per son, can perform ADLs including self feeding/grooming at home.       - Follows closely with OP neurology 20. Suboptimal vitamin D level: Level is 33, ergocalciferol 50,000U once per week for 7 weeks started. 21. Diastolic hypotension: discussed with Dr. Doylene Canard. EKG obtained and reviewed. Appreciate Dr. Doylene Canard seeing her. Amiodarone dose reduced by 50% 22. History of right foot fracture with current pain: will order XR- no acute changes noted 23. R knee and ankle pain. Check xray R knee and tib/fib, schedule Voltaren gel    LOS: 11 days A FACE TO FACE EVALUATION WAS PERFORMED  Jennye Boroughs 10/15/2022, 1:11 PM

## 2022-10-16 DIAGNOSIS — R7989 Other specified abnormal findings of blood chemistry: Secondary | ICD-10-CM

## 2022-10-16 MED ORDER — GABAPENTIN 100 MG PO CAPS
100.0000 mg | ORAL_CAPSULE | Freq: Every day | ORAL | Status: DC
  Administered 2022-10-16: 100 mg via ORAL
  Filled 2022-10-16: qty 1

## 2022-10-16 NOTE — Progress Notes (Signed)
Patient ID: [name] [name], female   DOB: 12/01/1929, 88 y.o.   MRN: [id number] Team Conference Report to Patient/Family  Team Conference discussion was reviewed with the patient and caregiver, including goals, any changes in plan of care and target discharge date.  Patient and caregiver express understanding and are in agreement.  The patient has a target discharge date of 10/21/22.  SW met with patient, son and family member in the room to provide team conference updates. Son pleased with update and would like to ensure that patient lungs and blood clots are addressed before discharge. Sw will follow up with physician. Patient will discharge [facility] with [facility] and HC with preferred agencies of [place]. No additional questions or concerns from family in room.   Dyanne Iha 10/16/2022, 2:58 PM

## 2022-10-16 NOTE — Progress Notes (Signed)
Physical Therapy Session Note  Patient Details  Name: [name] [name] MRN: [id number] Date of Birth: Dec 04, 1929  Today's Date: 10/16/2022 PT Individual Time:  Session 1: 0915-1009    Session 2: 1415-1530 PT Individual Time Calculation (min):  Session 1: 54 min        Session 2: 75 min Short Term Goals: Week 2:  PT Short Term Goal 1 (Week 2): STG=LTG due to LOS  Skilled Therapeutic Interventions/Progress Updates:  Session 1: Chart reviewed and pt agreeable to therapy. Pt received seated in WC with no c/o pain and family present. Session focused on ambulation to promote safe home access. Pt initiated session with 5 sit to stands using sup + RW. Pt then completed 7 trails of amb at 25ft-50ft using CGA + RW. Pt completed 1 trial of walking a straight path, 3 trials of walking with sharp turns, and 3 trials of stepping over objects. Pt noted to require extra VC for safe set up before sitting. Pt also required rest breaks between each trial. Pt then returned to room and required CGA for transfer to toilet and donning/doffing lower body clothes. At end of session, pt was left seated in [facility] with family present, alarm engaged, nurse call bell and all needs in reach.   Session 2: Chart reviewed and pt agreeable to therapy. Pt received seated in WC with no c/o pain. Session focused on balance, amb, and endurance training to promote activity tolerance for home mobility. Pt initiated session with 2 x 60secs of balance activities that included standing neutral, standing with feet together, and standing R staggered and L staggered. Pt was then transferred to therapy gym for time conservation. Pt completed 12 mins endurance training on NuSteps at activity+20sec rest (workload 1-2). Pt also completed 2 chair<>WC transfers during session with close supervision + RW. Pt returned to room in [facility] completing 21ft WC propulsion with MinA. In room, pt transferred to toilet using CGA + RW and donning/doffing clothing.  Pt then completed 3 trials of amb at 64ft each with CGA + RW. Pt transferred to bed at end of session with CGA + RW.  At end of session, pt was left emi-reclined in bed with family present, alarm engaged, nurse call bell and all needs in reach.     Therapy Documentation Precautions:  Precautions Precautions: Fall Precaution Comments: Monitor oxygen Restrictions Weight Bearing Restrictions: No General:      Therapy/Group: Individual Therapy  Dionne Milo, PT, DPT 10/16/2022, 10:11 AM

## 2022-10-16 NOTE — Progress Notes (Signed)
Occupational Therapy Session Note  Patient Details  Name: [name] [name] MRN: [id number] Date of Birth: 01/22/1930  Today's Date: 10/16/2022 OT Individual Time: 1130-1200 OT Individual Time Calculation (min): 30 min    Short Term Goals: Week 2:  OT Short Term Goal 1 (Week 2): STG = LTG due to ELOS  Skilled Therapeutic Interventions/Progress Updates:    Pt received in room with family, but they had to leave. Pt sat to EOB with CGA to S.  Pt said no when asked if she needed to use the bathroom, "when we get back I will".  Pt stood to RW with S and wc was on her L. As pt was pivoting to L and had to step back she just all of a sudden started to sit, so needed max A to keep her upright as she was not following directions to stay standing. Had to grab wc to pull it close to her.  Once in wc, pt taken to gym to work on standing exercises at table.  Pt stood and was fussing with her pants and needed help to adjust pants. Not able to follow directions to stay standing so worked on a B hand grasping activity with resistive pegs from seated.  Took pt back to room to have her toilet.  Removed BSC from over toilet as pt needs low seat.  Pt used bar to pivot to toilet with CGA.  Her brief was soaked, but able to urinate more on toilet.  Pt did self cleanse.  CGA back to wc and then back to bed using bar/bed rail.  Min A to adjust in bed. Pt in bed with all needs met and alarm set.   Therapy Documentation Precautions:  Precautions Precautions: Fall Precaution Comments: Monitor oxygen Restrictions Weight Bearing Restrictions: No  Pain:  No c/o pain  ADL: ADL Eating: Contact guard Where Assessed-Eating: Bed level Grooming: Moderate assistance (pt washes face and daughter in law combs hair secondary to fatigue) Where Assessed-Grooming: Wheelchair, Other (comment) (shower) Upper Body Bathing: Setup Where Assessed-Upper Body Bathing: Shower Lower Body Bathing: Contact guard Where Assessed-Lower  Body Bathing: Shower Upper Body Dressing: Setup Where Assessed-Upper Body Dressing: Other (Comment) (shower) Lower Body Dressing: Moderate assistance (to donn socks manage brief) Where Assessed-Lower Body Dressing: Other (Comment) (shower) Toileting: Minimal assistance (to pull down brief) Where Assessed-Toileting: Glass blower/designer: Therapist, music Method: Human resources officer: Unable to assess Social research officer, government: Environmental education officer Method: Heritage manager: Radio broadcast assistant   Therapy/Group: Individual Therapy  [place] 10/16/2022, 8:31 AM

## 2022-10-16 NOTE — Progress Notes (Signed)
Occupational Therapy Session Note  Patient Details  Name: [name] [name] MRN: [id number] Date of Birth: 1930-10-08  Today's Date: 10/16/2022 OT Individual Time: 0315-9458 OT Individual Time Calculation (min): 28 min    Short Term Goals: Week 2:  OT Short Term Goal 1 (Week 2): STG = LTG due to ELOS  Skilled Therapeutic Interventions/Progress Updates:  Pt greeted seated in w/c, pt agreeable to OT intervention. Session focus on BADL reeducation, functional mobility, dynamic standing balance, endurance and decreasing overall caregiver burden.                     Pt requesting to don pants from w/c pt required MOD A to don pants needing assist to pull pants up to waist line on L side as well as assist to thread pants. Pt completed sit>stand with Rw with CGA.  Total A transport to gym, worked on dynamic standing balance and endurance with pt tossing basketball to trampoline in standing with pt needing MIN A for balance, pt able to stand to toss ball 3x5 reps before needing to sit.   SpO2 96% ( on RA) HR 88   Pt completed ~ 10 ft of functional ambulation with Rw and CGA with close chair follow. Pt requested to be transported remainder of distance back to room d/t fatigue.   Ended session with pt seated in w/c with all needs within reach and safety belt alarm activated.                    Therapy Documentation Precautions:  Precautions Precautions: Fall Precaution Comments: Monitor oxygen Restrictions Weight Bearing Restrictions: No   Pain: unrated pain reported in BLEs, rest breaks provided as needed.     Therapy/Group: Individual Therapy  [facility] 10/16/2022, 9:43 AM

## 2022-10-16 NOTE — Progress Notes (Signed)
     Chart reviewed and situation discussed with Dr. Patterson Hammersmith earlier today.  I spoke with granddaughter [name] by phone to notify her that there appeared to be confusion among certain family members regarding code status.  I let [name] know that code status had been changed back to full code until we could clarify with all family members. [name] verbalizes understanding and agreement.  [name] reports that her grandmother has done advanced directives that specifically outlines desire for DNR/DNI. She thinks these documents were filed with Dr. Roseanne Kaufman office. She expresses some frustration with certain family for not adhering to her grandmother's previously stated wishes.  Discussed having a bedside meeting with patient, family, and interpreter (via video) in the next few days.   I have searched epic ([employer] as well as all scanned media files in epic) and was unable to locate any directive documents.     Plan: - Full code for now - I will reach out to Dr. Roseanne Kaufman office to attempt to locate advanced directive documents  - meeting with patient and family when I return to service on Sunday 11/26    Sherlean Foot, NP-C Palliative Medicine   Please call Palliative Medicine team phone with any questions [phone]. For individual providers please see AMION.   No charge

## 2022-10-16 NOTE — Progress Notes (Addendum)
Patient ID: [name] [name], female   DOB: 09/07/1930, 88 y.o.   MRN: [id number]  [facility] referral submitted. SW waiting on follow up from [place]. [place] Duck, Intake P: [phone]  Clinicals sent to [place] at [email or web address]  [facility] referral submitted to [facility].  Patient approved for PT OT AIDE

## 2022-10-16 NOTE — Patient Care Conference (Signed)
Inpatient RehabilitationTeam Conference and Plan of Care Update Date: 10/16/2022   Time: 11:33 AM    Patient Name: [name] [name]      Medical Record Number: [id number]  Date of Birth: 1930/09/18 Sex: Female         Room/Bed: 4M11C/4M11C-01 Payor Info: Payor: MEDICARE / Plan: MEDICARE PART A AND B / Product Type: *No Product type* /    Admit Date/Time:  10/04/2022  3:20 PM  Primary Diagnosis:  Debility  Hospital Problems: Principal Problem:   Debility    Expected Discharge Date: Expected Discharge Date: 10/21/22  Team Members Present: Physician leading conference: Dr. Fanny Dance Social Worker Present: Lavera Guise, BSW Nurse Present: Chana Bode, RN OT Present: Other (comment) Bretta Bang, OT) PPS Coordinator present : Fae Pippin, SLP     Current Status/Progress Goal Weekly Team Focus  Bowel/Bladder   incont Bladder   become cont bladder   toileting schedule, assess PRN    Swallow/Nutrition/ Hydration               ADL's   Supervision UB self-care, CGA to min A LB, CGA-min A for toileting. Fluctuates with assist due to fatigue. Family plans to hire caregiver either couples days a week for showers or 3 hours a day for any needs, therefore focus of therapy has been on toileting as pt's son is unable (for cultural reasons) to assist her but she can manage it with as little as CGA if not self-limiting. Family has asked for an extension but we are reaching a plateau and focus of sessions are geared towards family education and family would benefit from MD verbalizing the teams decision   Min A ADLs, supervision transfers/balance   d/c planning, shower transfers, cardiorespiratory endurance, balance    Mobility   bed mobility supervision, transfers with RW min A, gait 75ft with RW and CGA, 8 3in steps with bilateral handrails and min A. Limited by fatigue and weakness/deconditioning   supervision overall  functional mobility/transfers, generalized  strengthening and endurance, dynamic standing balance/coordination, gait training, D/C planning, and family education training.    Communication                Safety/Cognition/ Behavioral Observations               Pain   discomfort in Lower legs   min pain level with repositioning and prn meds   assess q4 and PRN    Skin   skin intact   remain intact  assess q shift and prn      Discharge Planning:  Patient extended? Discharging home with son. Family anticipates hiring [facility] services ([employer]) to assist with personal care.   Team Discussion: LOS extended to address family questions and neuropathic pain issues. Xray of knee notes arthritis; treated with voltaren gel. Constipation/? Ileus addressed; medications adjusted. Remains incontinent of bladder; will required scheduled toileting.   Patient on target to meet rehab goals: yes, currently needs supervision for upper body care and CGA for lower body care with CGA for toileting. Functional status fluctuates with fatigue and deconditioning. Needs supervision for bed mobility and min assist for transfers. aBle to ambulate up to 42' with a RW and CGA and complete 8 steps with bil rails with min assist. Goals for discharge set for supervision overall.   *See Care Plan and progress notes for long and short-term goals.   Revisions to Treatment Plan:  Focus on family education   Teaching Needs: Safety, medications, transfers, toileting,  etc.  Current Barriers to Discharge: Decreased caregiver support and Incontinence  Possible Resolutions to Barriers: Family education Hired caregivers to assist son with care HH follow up services DME: youth RW     Medical Summary Current Status: debility, CVA, leg pain, DM 2, CHF, Knee OA  Barriers to Discharge: Incontinence  Barriers to Discharge Comments: debility, CVA, leg pain, DM 2, CHF Possible Resolutions to [employer] Focus: consider gabapentin,xrays completed, monitor  weight   Continued Need for Acute Rehabilitation Level of Care: The patient requires daily medical management by a physician with specialized training in physical medicine and rehabilitation for the following reasons: Direction of a multidisciplinary physical rehabilitation program to maximize functional independence : Yes Medical management of patient stability for increased activity during participation in an intensive rehabilitation regime.: Yes Analysis of laboratory values and/or radiology reports with any subsequent need for medication adjustment and/or medical intervention. : Yes   I attest that I was present, lead the team conference, and concur with the assessment and plan of the team.   Chana Bode B 10/16/2022, 2:02 PM

## 2022-10-16 NOTE — Progress Notes (Signed)
PROGRESS NOTE   Subjective/Complaints: Asked about RLE xrays. Discussed no fracture and mild R knee OA noted.  ROS- denies CP, SOB, HA,  cough,  N/V/D, abdominal pain, +chronic tremor RLE pain  Objective:   DG Knee 1-2 Views Right  Result Date: 10/15/2022 CLINICAL DATA:  Pain. EXAM: RIGHT KNEE - 1-2 VIEW COMPARISON:  None Available. FINDINGS: No evidence for an acute fracture no subluxation or dislocation. Calcification of the lateral meniscus evident with subtle hypertrophic spurring in all 3 compartments. No worrisome lytic or sclerotic osseous abnormality. No evidence for joint effusion. IMPRESSION: Mild tricompartmental degenerative changes with chondrocalcinosis of the lateral meniscus. Electronically Signed   By: Misty Stanley M.D.   On: 10/15/2022 15:13   DG Tibia/Fibula Right  Result Date: 10/15/2022 CLINICAL DATA:  Pain. EXAM: RIGHT TIBIA AND FIBULA - 2 VIEW COMPARISON:  None Available. FINDINGS: No evidence for an acute fracture in the tibia or fibula. No worrisome lytic or sclerotic osseous abnormality. Anatomy of the knee better characterized on dedicated knee films performed at the same time and reported separately. IMPRESSION: No acute bony findings in the tibia or fibula. Electronically Signed   By: Misty Stanley M.D.   On: 10/15/2022 15:12   DG Foot 2 Views Right  Result Date: 10/14/2022 CLINICAL DATA:  Right foot pain. EXAM: RIGHT FOOT - 2 VIEW COMPARISON:  Right ankle radiographs 07/08/2013 FINDINGS: Postsurgical changes of partial resection of the distal medial aspect of the great toe metatarsal with a pin traversing a distal metatarsal neck remote healed osteotomy. Moderate second PIP and mild rest of the second through fifth interphalangeal joint space narrowing. Tiny plantar calcaneal heel spur. Minimal chronic enthesopathic change at the Achilles insertion on the calcaneus. Mild-to-moderate distal medial malleolar  degenerative spurring. No acute fracture is seen. No dislocation. IMPRESSION: Remote great toe metatarsal bunionectomy/osteotomy. Moderate degenerative changes of the second toe PIP joint. No acute fracture or dislocation. Electronically Signed   By: Yvonne Kendall M.D.   On: 10/14/2022 13:02   Recent Labs    10/14/22 0744  WBC 5.5  HGB 10.4*  HCT 32.0*  PLT 195    No results for input(s): "NA", "K", "CL", "CO2", "GLUCOSE", "BUN", "CREATININE", "CALCIUM" in the last 72 hours.   Intake/Output Summary (Last 24 hours) at 10/16/2022 1134 Last data filed at 10/15/2022 2113 Gross per 24 hour  Intake 720 ml  Output --  Net 720 ml         Physical Exam: Vital Signs Blood pressure (!) 104/43, pulse 65, temperature 97.6 F (36.4 C), temperature source Oral, resp. rate 17, height 5' (1.524 m), weight 75.3 kg, SpO2 97 %. General: No acute distress, BMI 32.68 Mood and affect are appropriate, pleasant Heart: Regular rate and rhythm no rubs murmurs or extra sounds HEENT: tremor of head, MMM Lungs: Clear to auscultation, breathing unlabored, no rales or wheezes Abdomen: Positive bowel sounds, soft nontender to palpation, nondistended Extremities: No clubbing, cyanosis, or edema Skin: No evidence of breakdown, no evidence of rash, IV heparin attached Neurologic: Cranial nerves II through XII intact, motor strength is 5/5 in left and 3-/5 right  deltoid, bicep, tricep, grip, hip flexor, knee extensors,  ankle dorsiflexor and plantar flexor Sensory exam hypersensitive to touch RLE  Cerebellar exam- intention tremor RUE, chronic tremor of head Musculoskeletal: Full range of motion in all 4 extremities. No joint swelling, no evidence of Right knee or ankle effusion no erythema. Tenderness around R knee Ambulating MinA with RW   Assessment/Plan: 1. Functional deficits which require 3+ hours per day of interdisciplinary therapy in a comprehensive inpatient rehab setting. Physiatrist is providing  close team supervision and 24 hour management of active medical problems listed below. Physiatrist and rehab team continue to assess barriers to discharge/monitor patient progress toward functional and medical goals  Care Tool:  Bathing    Body parts bathed by patient: Right arm, Left arm, Chest, Abdomen, Right upper leg, Left upper leg, Face   Body parts bathed by helper: Front perineal area, Buttocks, Right lower leg, Left lower leg     Bathing assist Assist Level: Moderate Assistance - Patient 50 - 74%     Upper Body Dressing/Undressing Upper body dressing   What is the patient wearing?: Pull over shirt    Upper body assist Assist Level: Set up assist    Lower Body Dressing/Undressing Lower body dressing      What is the patient wearing?: Pants, Underwear/pull up     Lower body assist Assist for lower body dressing: Contact Guard/Touching assist     Toileting Toileting    Toileting assist Assist for toileting: Supervision/Verbal cueing     Transfers Chair/bed transfer  Transfers assist     Chair/bed transfer assist level: Supervision/Verbal cueing     Locomotion Ambulation   Ambulation assist   Ambulation activity did not occur: Safety/medical concerns (Unable to assess gait, stair mobility or picking up an item from the floor secondary to decreased strength, increased tremors, impaired endurance/activity tolerance and poor dynamic stability- Therapist to assess this afternoon if able.)  Assist level: Contact Guard/Touching assist Assistive device: Walker-rolling Max distance: 44f   Walk 10 feet activity   Assist  Walk 10 feet activity did not occur: Safety/medical concerns  Assist level: Contact Guard/Touching assist Assistive device: Walker-rolling   Walk 50 feet activity   Assist Walk 50 feet with 2 turns activity did not occur: Safety/medical concerns  Assist level: Contact Guard/Touching assist Assistive device: Walker-rolling     Walk 150 feet activity   Assist Walk 150 feet activity did not occur: Safety/medical concerns         Walk 10 feet on uneven surface  activity   Assist Walk 10 feet on uneven surfaces activity did not occur: Safety/medical concerns   Assist level: 2 helpers Assistive device: WAeronautical engineerIs the patient using a wheelchair?: Yes Type of Wheelchair: Manual    Wheelchair assist level: Supervision/Verbal cueing Max wheelchair distance: 233f   Wheelchair 50 feet with 2 turns activity    Assist        Assist Level: Total Assistance - Patient < 25%   Wheelchair 150 feet activity     Assist      Assist Level: Total Assistance - Patient < 25%   Blood pressure (!) 104/43, pulse 65, temperature 97.6 F (36.4 C), temperature source Oral, resp. rate 17, height 5' (1.524 m), weight 75.3 kg, SpO2 97 %.  Medical Problem List and Plan: 1. Functional deficits secondary to debility/multi medical including left thalamic posterior limb infarction 2015 receiving inpatient rehab services 07/21/2014 - 07/30/2014             -  patient may shower             -ELOS/Goals: 10-14 days  FULL CODE as per discussion with son  Discussed extension with Vicente Males and [facility] as per familly's request, but patient is likely to meet supervision goals by Sunday  Team conference today please see physician documentation under team conference tab, met with team  to discuss problems,progress, and goals. Formulized individual treatment plan based on medical history, underlying problem and comorbidities.    Discussed length of stay with family.   Updated family 2.  Acute on chronic PE: Discussed with family that it can take time for these to break up and dissolve. -DVT/anticoagulation: Venous Doppler study 10/02/2022 findings consistent with age-indeterminate DVT right posterior tibial vein as well as CT angiogram of the chest showing acute on chronic pulmonary emboli.    Change IV heparin to low dose Eliquis as per GI - Per documentation will need serial [place] for RLE DVT on 11/13 and 11/22              -antiplatelet therapy: N/A   3. Pain Management: N/a, discuss with sons weaning medications if pain is well controlled. D/c Neurontin 100 mg nightly, discontinue cymbalta since not having pain, Voltaren gel as needed. Provide list of foods for pain. Continue Tylenol. 4. Mood/Behavior/Sleep: Provide emotional support             -antipsychotic agents: N/A 5. Neuropsych/cognition: This patient is capable of making decisions on her own behalf. 6. Skin/Wound Care: Routine skin checks 7. Fluids/Electrolytes/Nutrition: Routine in and outs with follow-up chemistries 8.  GI bleed.  Status post upper GI endoscopy 09/26/2022.  Continue PPI twice daily. Cleared to restart Eliquis by GI. Discussed increased risk of bleeding.  9.  Acute respiratory failure requiring noninvasive imaging treated with aggressive diuresis.  Check oxygen saturations every shift. Wean oxygen as tolerated.  10.  Acute on chronic diastolic congestive heart failure.  Continue Demadex 20 mg daily.  Monitor for any signs of fluid overload.  Daily weights. Weight is down, continue to monitor daily              - Patient was using supplemental oxygen as needed prior to admission.   Filed Weights   10/14/22 0500 10/15/22 0552 10/16/22 0342  Weight: 75.9 kg 74.7 kg 75.3 kg  11/22 weights stable  11.  New onset atrial fibrillation with RVR.  Amiodarone 200 mg daily.  Follow-up cardiology service Dr. Doylene Canard.  Status post DCCV 09/24/2022.  Continue Cardizem 30 mg every 8 hours, Lanoxin 0.0625 mg daily, Toprol 50 mg daily. Magnesium level reviewed and is normal 12.  Elevated LFTs.  Felt to be possibly due to amiodarone.  Hepatitis panel negative follow-up labs. -11/22 recheck friday 13.  Seizure disorder.  Longstanding Keppra 500 mg twice daily, continue 14.  Hyperlipidemia.  Lipitor 15.  Ileus.  Resolving.   Diet advanced to mechanical soft. LBM <24 hours per son.  -11/21 Increase miralax to BID -11/22 BM today, improved, continue to monitor 16.  Obesity.  BMI 33.20-->32.21:  Dietary follow-up 17.  History of type 2 diabetes mellitus, latest hemoglobin A1c 5.4.  18.  UTI.  80,000 gram-positive cocci/30,000 gram-negative rod.  Initially on Omnicef transition to amoxicillin.  19. Hx essential tremor, severe. On longstanding primidone 250 mg BID, low-dose gabapentin, cymbalta, keppra.       - Per son, can perform ADLs including self feeding/grooming at home.       - Follows closely with  OP neurology 20. Suboptimal vitamin D level: Level is 33, ergocalciferol 50,000U once per week for 7 weeks started. 21. Diastolic hypotension: discussed with Dr. Doylene Canard. EKG obtained and reviewed. Appreciate Dr. Doylene Canard seeing her. Amiodarone dose reduced by 50% 22. History of right foot fracture with current pain: will order XR- no acute changes noted 23. R knee and ankle pain. Check xray R knee and tib/fib, schedule Voltaren gel   -11/23 Restart Neurontin 169m HS  LOS: 12 days A FACE TO FACE EVALUATION WAS PERFORMED  YJennye Boroughs11/22/2023, 11:34 AM

## 2022-10-17 DIAGNOSIS — G250 Essential tremor: Secondary | ICD-10-CM

## 2022-10-17 DIAGNOSIS — M792 Neuralgia and neuritis, unspecified: Secondary | ICD-10-CM

## 2022-10-17 MED ORDER — GABAPENTIN 100 MG PO CAPS
100.0000 mg | ORAL_CAPSULE | Freq: Three times a day (TID) | ORAL | Status: DC
  Administered 2022-10-17 – 2022-10-22 (×17): 100 mg via ORAL
  Filled 2022-10-17 (×18): qty 1

## 2022-10-17 MED ORDER — ORAL CARE MOUTH RINSE: 15.0000 mL | OROMUCOSAL | Status: DC | PRN

## 2022-10-17 MED ORDER — TORSEMIDE 20 MG PO TABS
10.0000 mg | ORAL_TABLET | Freq: Every day | ORAL | Status: DC
  Administered 2022-10-18 – 2022-10-22 (×5): 10 mg via ORAL
  Filled 2022-10-17 (×5): qty 1

## 2022-10-17 NOTE — Progress Notes (Signed)
PROGRESS NOTE   Subjective/Complaints: She reports tingling pain in b/l feet is her worst pain currently. She continues to have RLE knee pain that is chronic for several years.  Family concerned BP is low, last vitals 121/50, she is asymptomatic.   ROS- denies CP, SOB, HA,  cough,  N/V/D, abdominal pain, +chronic tremor B/L feet tingling  Objective:   DG Knee 1-2 Views Right  Result Date: 10/15/2022 CLINICAL DATA:  Pain. EXAM: RIGHT KNEE - 1-2 VIEW COMPARISON:  None Available. FINDINGS: No evidence for an acute fracture no subluxation or dislocation. Calcification of the lateral meniscus evident with subtle hypertrophic spurring in all 3 compartments. No worrisome lytic or sclerotic osseous abnormality. No evidence for joint effusion. IMPRESSION: Mild tricompartmental degenerative changes with chondrocalcinosis of the lateral meniscus. Electronically Signed   By: Kennith Center M.D.   On: 10/15/2022 15:13   DG Tibia/Fibula Right  Result Date: 10/15/2022 CLINICAL DATA:  Pain. EXAM: RIGHT TIBIA AND FIBULA - 2 VIEW COMPARISON:  None Available. FINDINGS: No evidence for an acute fracture in the tibia or fibula. No worrisome lytic or sclerotic osseous abnormality. Anatomy of the knee better characterized on dedicated knee films performed at the same time and reported separately. IMPRESSION: No acute bony findings in the tibia or fibula. Electronically Signed   By: Kennith Center M.D.   On: 10/15/2022 15:12   No results for input(s): "WBC", "HGB", "HCT", "PLT" in the last 72 hours.  No results for input(s): "NA", "K", "CL", "CO2", "GLUCOSE", "BUN", "CREATININE", "CALCIUM" in the last 72 hours.  No intake or output data in the 24 hours ending 10/17/22 0845       Physical Exam: Vital Signs Blood pressure (!) 108/43, pulse 72, temperature 97.9 F (36.6 C), temperature source Oral, resp. rate 18, height 5' (1.524 m), weight 75.1 kg, SpO2 95  %. General: No acute distress, BMI 32.68 Mood and affect are appropriate, pleasant Heart: Regular rate and rhythm no rubs murmurs or extra sounds HEENT: tremor of head, MMM Lungs: Clear to auscultation, breathing unlabored, no rales or wheezes Abdomen: Positive bowel sounds, soft nontender to palpation, nondistended Extremities: No clubbing, cyanosis, or edema Skin: No evidence of breakdown, no evidence of rash Neurologic: Cranial nerves II through XII intact, motor strength is 5/5 in left and 3-/5 right  deltoid, bicep, tricep, grip, hip flexor, knee extensors, ankle dorsiflexor and plantar flexor Sensory exam hypersensitive to touch RLE, sensation intact all 4 extremities to LT  Cerebellar exam- intention tremor RUE, chronic tremor of head Musculoskeletal: Full range of motion in all 4 extremities. No joint swelling, no evidence of Right knee or ankle , swelling, effusion no erythema. Tenderness around R knee   Assessment/Plan: 1. Functional deficits which require 3+ hours per day of interdisciplinary therapy in a comprehensive inpatient rehab setting. Physiatrist is providing close team supervision and 24 hour management of active medical problems listed below. Physiatrist and rehab team continue to assess barriers to discharge/monitor patient progress toward functional and medical goals  Care Tool:  Bathing    Body parts bathed by patient: Right arm, Left arm, Chest, Abdomen, Right upper leg, Left upper leg, Face   Body  parts bathed by helper: Front perineal area, Buttocks, Right lower leg, Left lower leg     Bathing assist Assist Level: Moderate Assistance - Patient 50 - 74%     Upper Body Dressing/Undressing Upper body dressing   What is the patient wearing?: Pull over shirt    Upper body assist Assist Level: Set up assist    Lower Body Dressing/Undressing Lower body dressing      What is the patient wearing?: Pants, Underwear/pull up     Lower body assist Assist  for lower body dressing: Contact Guard/Touching assist     Toileting Toileting    Toileting assist Assist for toileting: Supervision/Verbal cueing     Transfers Chair/bed transfer  Transfers assist     Chair/bed transfer assist level: Supervision/Verbal cueing     Locomotion Ambulation   Ambulation assist   Ambulation activity did not occur: Safety/medical concerns (Unable to assess gait, stair mobility or picking up an item from the floor secondary to decreased strength, increased tremors, impaired endurance/activity tolerance and poor dynamic stability- Therapist to assess this afternoon if able.)  Assist level: Contact Guard/Touching assist Assistive device: Walker-rolling Max distance: 3ft   Walk 10 feet activity   Assist  Walk 10 feet activity did not occur: Safety/medical concerns  Assist level: Contact Guard/Touching assist Assistive device: Walker-rolling   Walk 50 feet activity   Assist Walk 50 feet with 2 turns activity did not occur: Safety/medical concerns  Assist level: Contact Guard/Touching assist Assistive device: Walker-rolling    Walk 150 feet activity   Assist Walk 150 feet activity did not occur: Safety/medical concerns         Walk 10 feet on uneven surface  activity   Assist Walk 10 feet on uneven surfaces activity did not occur: Safety/medical concerns   Assist level: 2 helpers Assistive device: Aeronautical engineer Is the patient using a wheelchair?: Yes Type of Wheelchair: Manual    Wheelchair assist level: Supervision/Verbal cueing Max wheelchair distance: 56ft    Wheelchair 50 feet with 2 turns activity    Assist        Assist Level: Total Assistance - Patient < 25%   Wheelchair 150 feet activity     Assist      Assist Level: Total Assistance - Patient < 25%   Blood pressure (!) 108/43, pulse 72, temperature 97.9 F (36.6 C), temperature source Oral, resp. rate 18,  height 5' (1.524 m), weight 75.1 kg, SpO2 95 %.  Medical Problem List and Plan: 1. Functional deficits secondary to debility/multi medical including left thalamic posterior limb infarction 2015 receiving inpatient rehab services 07/21/2014 - 07/30/2014             -patient may shower             -ELOS/Goals: 10-14 days  FULL CODE as per discussion with son  Discussed extension with Vicente Males and [facility] as per familly's request, but patient is likely to meet supervision goals by Sunday  -No therapy today due to holiday, restart tomorrow -Updated family 2.  Acute on chronic PE: Discussed with family that it can take time for these to break up and dissolve. -DVT/anticoagulation: Venous Doppler study 10/02/2022 findings consistent with age-indeterminate DVT right posterior tibial vein as well as CT angiogram of the chest showing acute on chronic pulmonary emboli.   Change IV heparin to low dose Eliquis as per GI - Per documentation will need serial [place] for RLE DVT on 11/13  and 11/22              -antiplatelet therapy: N/A   3. Pain Management: N/a, discuss with sons weaning medications if pain is well controlled. D/c Neurontin 100 mg nightly, discontinue cymbalta since not having pain, Voltaren gel as needed. Provide list of foods for pain. Continue Tylenol. 4. Mood/Behavior/Sleep: Provide emotional support             -antipsychotic agents: N/A 5. Neuropsych/cognition: This patient is capable of making decisions on her own behalf. 6. Skin/Wound Care: Routine skin checks 7. Fluids/Electrolytes/Nutrition: Routine in and outs with follow-up chemistries 8.  GI bleed.  Status post upper GI endoscopy 09/26/2022.  Continue PPI twice daily. Cleared to restart Eliquis by GI. Discussed increased risk of bleeding.  9.  Acute respiratory failure requiring noninvasive imaging treated with aggressive diuresis.  Check oxygen saturations every shift. Wean oxygen as tolerated.  10.  Acute on chronic diastolic congestive  heart failure.  Continue Demadex 20 mg daily.  Monitor for any signs of fluid overload.  Daily weights. Weight is down, continue to monitor daily              - Patient was using supplemental oxygen as needed prior to admission.   Filed Weights   10/15/22 0552 10/16/22 0342 10/17/22 0458  Weight: 74.7 kg 75.3 kg 75.1 kg  11/22 weights stable  11.  New onset atrial fibrillation with RVR.  Amiodarone 200 mg daily.  Follow-up cardiology service Dr. Doylene Canard.  Status post DCCV 09/24/2022.  Continue Cardizem 30 mg every 8 hours, Lanoxin 0.0625 mg daily, Toprol 50 mg daily. Magnesium level reviewed and is normal 12.  Elevated LFTs.  Felt to be possibly due to amiodarone.  Hepatitis panel negative follow-up labs. -11/22 recheck friday 13.  Seizure disorder.  Longstanding Keppra 500 mg twice daily, continue 14.  Hyperlipidemia.  Lipitor 15.  Ileus.  Resolving.  Diet advanced to mechanical soft. LBM <24 hours per son.  -11/21 Increase miralax to BID -11/22 BM today, improved, continue to monitor -11/23 BM today, continue to monitor 16.  Obesity.  BMI 33.20-->32.21:  Dietary follow-up 17.  History of type 2 diabetes mellitus, latest hemoglobin A1c 5.4.  18.  UTI.  80,000 gram-positive cocci/30,000 gram-negative rod.  Initially on Omnicef transition to amoxicillin.  19. Hx essential tremor, severe. On longstanding primidone 250 mg BID, low-dose gabapentin, cymbalta, keppra.       - Per son, can perform ADLs including self feeding/grooming at home.       - Follows closely with OP neurology - Restart gabapentin may help 20. Suboptimal vitamin D level: Level is 33, ergocalciferol 50,000U once per week for 7 weeks started. 21. Diastolic hypotension: discussed with Dr. Doylene Canard. EKG obtained and reviewed. Appreciate Dr. Doylene Canard seeing her. Amiodarone dose reduced by 50%  -11/23, asymptomatic, will consider discussion with cardiology regarding this again tomorrow 22. History of right foot fracture with  current pain: will order XR- no acute changes noted 23. Knee OA. Check xray R knee and tib/fib- Knee OA.  -Continue voltaren   24. B/L feet tingling pain  -11/22 Restart Neurontin 100mg  HS  -11/23 increase Neurontin dose to 100 TID LOS: 13 days A FACE TO FACE EVALUATION WAS PERFORMED  Jennye Boroughs 10/17/2022, 8:45 AM

## 2022-10-17 NOTE — Consult Note (Signed)
Ref: Zola Button, MD   Subjective:  Weakness continues. VS stable after reducing BP medications.  Objective:  Vital Signs in the last 24 hours: Temp:  [97.9 F (36.6 C)-98.5 F (36.9 C)] 98.5 F (36.9 C) (11/23 1947) Pulse Rate:  [72-87] 87 (11/23 1947) Resp:  [14-18] 18 (11/23 1947) BP: (108-121)/(42-50) 119/48 (11/23 1947) SpO2:  [94 %-98 %] 94 % (11/23 1947) Weight:  [75.1 kg] 75.1 kg (11/23 0458)  Physical Exam: BP Readings from Last 1 Encounters:  10/17/22 (!) 119/48     Wt Readings from Last 1 Encounters:  10/17/22 75.1 kg    Weight change: -0.2 kg Body mass index is 32.33 kg/m. HEENT: [place]/AT, Eyes-Brown, Conjunctiva-Pale pink, Sclera-Non-icteric. Tongue is dry. Neck: No JVD, No bruit, Trachea midline. Lungs:  Clear, Bilateral. Cardiac:  Regular rhythm, normal S1 and S2, no S3. II/VI systolic murmur. Abdomen:  Soft, non-tender. BS present. Extremities:  No edema present. No cyanosis. No clubbing. CNS: AxOx3, Cranial nerves grossly intact, moves all 4 extremities.  Skin: Warm and dry.   Intake/Output from previous day: No intake/output data recorded.    Lab Results: BMET    Component Value Date/Time   NA 134 (L) 10/07/2022 0526   NA 131 (L) 10/04/2022 0622   NA 135 10/03/2022 0135   NA 141 06/14/2022 1505   NA 141 04/26/2020 1708   NA 141 06/22/2018 1448   K 4.2 10/07/2022 0526   K 4.1 10/04/2022 0622   K 4.3 10/03/2022 0135   CL 97 (L) 10/07/2022 0526   CL 91 (L) 10/04/2022 0622   CL 91 (L) 10/03/2022 0135   CO2 27 10/07/2022 0526   CO2 32 10/04/2022 0622   CO2 35 (H) 10/03/2022 0135   GLUCOSE 131 (H) 10/07/2022 0526   GLUCOSE 131 (H) 10/04/2022 0622   GLUCOSE 132 (H) 10/03/2022 0135   BUN 18 10/07/2022 0526   BUN 17 10/04/2022 0622   BUN 17 10/03/2022 0135   BUN 17 06/14/2022 1505   BUN 19 04/26/2020 1708   BUN 14 06/22/2018 1448   CREATININE 0.72 10/07/2022 0526   CREATININE 0.75 10/04/2022 0622   CREATININE 0.75 10/03/2022 0135    CALCIUM 8.3 (L) 10/07/2022 0526   CALCIUM 8.3 (L) 10/04/2022 0622   CALCIUM 8.6 (L) 10/03/2022 0135   GFRNONAA >60 10/07/2022 0526   GFRNONAA >60 10/04/2022 0622   GFRNONAA >60 10/03/2022 0135   GFRAA 69 04/26/2020 1708   GFRAA >60 06/15/2019 0841   GFRAA >60 11/13/2018 1941   CBC    Component Value Date/Time   WBC 5.5 10/14/2022 0744   RBC 3.56 (L) 10/14/2022 0744   HGB 10.4 (L) 10/14/2022 0744   HGB 15.2 04/26/2020 1708   HCT 32.0 (L) 10/14/2022 0744   HCT 46.5 04/26/2020 1708   PLT 195 10/14/2022 0744   PLT 102 (L) 04/26/2020 1708   MCV 89.9 10/14/2022 0744   MCV 94 04/26/2020 1708   MCH 29.2 10/14/2022 0744   MCHC 32.5 10/14/2022 0744   RDW 14.6 10/14/2022 0744   RDW 12.8 04/26/2020 1708   LYMPHSABS 1.7 10/14/2022 0744   LYMPHSABS 1.6 04/26/2020 1708   MONOABS 0.5 10/14/2022 0744   EOSABS 0.1 10/14/2022 0744   EOSABS 0.0 04/26/2020 1708   BASOSABS 0.1 10/14/2022 0744   BASOSABS 0.0 04/26/2020 1708   HEPATIC Function Panel Recent Labs    10/03/22 0135 10/04/22 0622 10/07/22 0526  PROT 5.8* 5.9* 6.2*  ALBUMIN 2.7* 2.7* 2.7*  AST 101* 93* 64*  ALT 160* 146* 106*  ALKPHOS 88 101 89   HEMOGLOBIN A1C Lab Results  Component Value Date   MPG 126 (H) 07/18/2014   CARDIAC ENZYMES Lab Results  Component Value Date   TROPONINI <0.30 03/24/2012   BNP No results for input(s): "PROBNP" in the last 8760 hours. TSH Recent Labs    09/16/22 1929  TSH 1.792   CHOLESTEROL Recent Labs    06/14/22 1505 09/17/22 0207  CHOL 180 161    Scheduled Meds:  amiodarone  100 mg Oral Daily   apixaban  2.5 mg Oral BID   atorvastatin  10 mg Oral QPM   cycloSPORINE  1 drop Both Eyes BID   digoxin  0.0625 mg Oral Daily   diltiazem  120 mg Oral Daily   gabapentin  100 mg Oral TID   lactose free nutrition  237 mL Oral TID WC   levETIRAcetam  500 mg Oral BID   magnesium oxide  400 mg Oral Daily   metoprolol succinate  50 mg Oral QPM   multivitamin with minerals  1  tablet Oral Daily   pantoprazole  40 mg Oral BID   polyethylene glycol  17 g Oral BID   potassium chloride  20 mEq Oral Daily   [START ON 10/18/2022] torsemide  10 mg Oral Daily   Vitamin D (Ergocalciferol)  50,000 Units Oral Q Wed   Continuous Infusions: PRN Meds:.acetaminophen, diclofenac Sodium, guaiFENesin, mouth rinse, senna, simethicone  Assessment/Plan: Paroxysmal atrial fibrillation Dehydration Chronic diastolic left heart failure Acute right lower extremity DVT Recent GI bleed Anemia of blood loss HTN HLD Old stroke Arthritis Parkinson's disease  Plan: Decrease torsemide by 50 %. CXR in AM. EKG in AM   LOS: 13 days   Time spent including chart review, lab review, examination, discussion with patient/Family : 30 min   Orpah Cobb  MD  10/17/2022, 8:25 PM

## 2022-10-18 ENCOUNTER — Inpatient Hospital Stay (HOSPITAL_COMMUNITY): Payer: Medicare Other

## 2022-10-18 DIAGNOSIS — I824Y1 Acute embolism and thrombosis of unspecified deep veins of right proximal lower extremity: Secondary | ICD-10-CM

## 2022-10-18 DIAGNOSIS — I4891 Unspecified atrial fibrillation: Secondary | ICD-10-CM

## 2022-10-18 LAB — COMPREHENSIVE METABOLIC PANEL
ALT: 56 U/L — ABNORMAL HIGH (ref 0–44)
AST: 42 U/L — ABNORMAL HIGH (ref 15–41)
Albumin: 3.3 g/dL — ABNORMAL LOW (ref 3.5–5.0)
Alkaline Phosphatase: 91 U/L (ref 38–126)
Anion gap: 13 (ref 5–15)
BUN: 38 mg/dL — ABNORMAL HIGH (ref 8–23)
CO2: 28 mmol/L (ref 22–32)
Calcium: 9.3 mg/dL (ref 8.9–10.3)
Chloride: 94 mmol/L — ABNORMAL LOW (ref 98–111)
Creatinine, Ser: 1.94 mg/dL — ABNORMAL HIGH (ref 0.44–1.00)
GFR, Estimated: 24 mL/min — ABNORMAL LOW (ref >60)
Glucose, Bld: 137 mg/dL — ABNORMAL HIGH (ref 70–99)
Potassium: 4.2 mmol/L (ref 3.5–5.1)
Sodium: 135 mmol/L (ref 135–145)
Total Bilirubin: 0.4 mg/dL (ref 0.3–1.2)
Total Protein: 7.5 g/dL (ref 6.5–8.1)

## 2022-10-18 LAB — CBC
HCT: 33.5 % — ABNORMAL LOW (ref 36.0–46.0)
Hemoglobin: 10.7 g/dL — ABNORMAL LOW (ref 12.0–15.0)
MCH: 28.3 pg (ref 26.0–34.0)
MCHC: 31.9 g/dL (ref 30.0–36.0)
MCV: 88.6 fL (ref 80.0–100.0)
Platelets: 206 10*3/uL (ref 150–400)
RBC: 3.78 MIL/uL — ABNORMAL LOW (ref 3.87–5.11)
RDW: 14.6 % (ref 11.5–15.5)
WBC: 5.5 10*3/uL (ref 4.0–10.5)
nRBC: 0 % (ref 0.0–0.2)

## 2022-10-18 NOTE — Progress Notes (Signed)
Patient ID: [name] [name], female   DOB: [birth year], 88 y.o.   MRN: [id number]  Youth RW ordered through [employer]. Follow HH orders sent to [facility] for FU PT/OT/aide

## 2022-10-18 NOTE — Progress Notes (Signed)
Physical Therapy Discharge Summary  Patient Details  Name: [name] [name] MRN: [id number] Date of Birth: [birth year]  Date of Discharge from PT service:October 20, 2022  Patient has met 4 of 6 long term goals due to improved activity tolerance, improved balance, improved postural control, increased strength, increased range of motion, ability to compensate for deficits, improved awareness, and improved coordination. Patient to discharge at a wheelchair level Supervision.  Patient's care partner is independent to provide the necessary physical assistance at discharge. Pt's family has participated on hands on training and verbalized and demonstrated confidence with tasks to ensure safe discharge home.   Reasons goals not met: Pt did not meet bed mobility goal of mod I as pt currently is supervision and requires cues for technique. Pt also did not meet WC mobility goal of 173f mod I as pt currently requires supervision and cues for propulsion technique and is only able to propel 339fin a straight line prior to stopping due to fatigue/weakness.   Recommendation:  Patient will benefit from ongoing skilled PT services in home health setting to continue to advance safe functional mobility, address ongoing impairments in transfers, generalized strengthening and endurance, dynamic standing balance, gait training, and to minimize fall risk.  Equipment: Youth RW  Reasons for discharge: treatment goals met  Patient/family agrees with progress made and goals achieved: Yes  PT Discharge Precautions/Restrictions Precautions Precautions: Fall Restrictions Weight Bearing Restrictions: No Pain Interference Pain Interference Pain Effect on Sleep: 3. Frequently Pain Interference with Therapy Activities: 1. Rarely or not at all Pain Interference with Day-to-Day Activities: 2. Occasionally Cognition Overall Cognitive Status: Within Functional Limits for tasks assessed Arousal/Alertness:  Awake/alert Orientation Level: Oriented X4 Memory: Appears intact Awareness: Appears intact Problem Solving: Appears intact Safety/Judgment: Appears intact Sensation Sensation Light Touch: Appears Intact Peripheral sensation comments: Patient reports R LE sensation different compared to L LE with R LE being more dull. Light Touch Impaired Details: Impaired RLE Hot/Cold: Appears Intact Proprioception: Appears Intact Stereognosis: Appears Intact Additional Comments: difficulty testing sensation due to language barriers but appeared grossly intact Coordination Gross Motor Movements are Fluid and Coordinated: No Fine Motor Movements are Fluid and Coordinated: No Coordination and Movement Description: mild uncoordination due to tremors, generalized weakness/deconditoning, and decreased balance strategies Finger Nose Finger Test: Bilateral tremors > with intentional movement vs at rest Heel Shin Test: ROM WFL however coordination impaired Motor  Motor Motor: Abnormal postural alignment and control Motor - Skilled Clinical Observations: bilateral tremors in UEs at baseline  Mobility Bed Mobility Bed Mobility: Rolling Right;Rolling Left;Sit to Supine;Supine to Sit Rolling Right: Supervision/verbal cueing Rolling Left: Supervision/Verbal cueing Supine to Sit: Supervision/Verbal cueing Sit to Supine: Supervision/Verbal cueing Transfers Transfers: Sit to Stand;Stand to Sit;Stand Pivot Transfers Sit to Stand: Supervision/Verbal cueing Stand to Sit: Supervision/Verbal cueing Stand Pivot Transfers: Supervision/Verbal cueing Stand Pivot Transfer Details (indicate cue type and reason): verbal cues for safety when turning Transfer (Assistive device): Rolling walker Locomotion  Gait Ambulation: Yes Gait Assistance: Supervision/Verbal cueing Gait Distance (Feet): 124 Feet Assistive device: Rolling walker Gait Assistance Details: Verbal cues for safe use of DME/AE;Verbal cues for  precautions/safety;Verbal cues for gait pattern Gait Assistance Details: verbal cues for upright posture and to remain within RW BOS Gait Gait: Yes Gait Pattern: Impaired Gait Pattern: Step-to pattern;Step-through pattern;Decreased step length - right;Decreased step length - left;Decreased stride length;Trunk flexed;Poor foot clearance - left;Poor foot clearance - right;Narrow base of support;Shuffle Gait velocity: decreased Stairs / Additional Locomotion Stairs: Yes Stairs Assistance: Minimal Assistance -  Patient > 75% Stair Management Technique: One rail Right Number of Stairs: 4 Height of Stairs: 6 Ramp: Contact Guard/touching assist (RW) Wheelchair Mobility Wheelchair Mobility: Yes Wheelchair Assistance: Chartered loss adjuster: Both upper extremities Wheelchair Parts Management: Needs assistance Distance: 46f  Trunk/Postural Assessment  Cervical Assessment Cervical Assessment: Exceptions to [facility]) Thoracic Assessment Thoracic Assessment: Exceptions to [facility](kyphosis) Lumbar Assessment Lumbar Assessment: Exceptions to [facility](posterior pelvic tilt) Postural Control Postural Control: Within Functional Limits  Balance Balance Balance Assessed: Yes Static Sitting Balance Static Sitting - Balance Support: Feet supported;No upper extremity supported Static Sitting - Level of Assistance: 5: Stand by assistance (supervision) Dynamic Sitting Balance Dynamic Sitting - Balance Support: Feet supported;No upper extremity supported Dynamic Sitting - Level of Assistance: 5: Stand by assistance (supervision) Static Standing Balance Static Standing - Balance Support: Bilateral upper extremity supported;During functional activity Static Standing - Level of Assistance: 5: Stand by assistance (supervision) Dynamic Standing Balance Dynamic Standing - Balance Support: Bilateral upper extremity supported;During functional activity Dynamic Standing - Level of  Assistance: 5: Stand by assistance (supervision) Extremity Assessment  RLE Assessment RLE Assessment: Exceptions to [facility] Hospital[facility] Strength Comments: grossly generalized to 4-/5 LLE Assessment LLE Assessment: Exceptions to W[facility] LLCGeneral Strength Comments: grossly generalized to 4/5  AAlfonse AlpersPT, DPT  10/18/2022, 7:07 AM

## 2022-10-18 NOTE — Progress Notes (Signed)
Inpatient Rehabilitation Care Coordinator Discharge Note   Patient Details  Name: [name] [name] MRN: [id number] Date of Birth: 07/12/1930   Discharge location: Home W/ Son  Length of Stay: 19 Days  Discharge activity level: Cga/Min-Mod  Home/community participation: 2 sons, DIL, granddaughter and family  Patient response WU:JWJXBJ Literacy - How often do you need to have someone help you when you read instructions, pamphlets, or other written material from your doctor or pharmacy?: Never  Patient response YN:WGNFAO Isolation - How often do you feel lonely or isolated from those around you?: Never  Services provided included: MD, RD, PT, OT, SLP, RN, CM, TR, Pharmacy, Neuropsych, SW  Financial Services:  Field seismologist Utilized: Medicare    Choices offered to/list presented to: sons and granddaughter (deena); [place], [phone]  Follow-up services arranged:  Home Health Home Health Agency: [facility] PT OT Aide        Patient response to transportation need: Is the patient able to respond to transportation needs?: Yes In the past 12 months, has lack of transportation kept you from medical appointments or from getting medications?: No In the past 12 months, has lack of transportation kept you from meetings, work, or from getting things needed for daily living?: No    Comments (or additional information):  Patient/Family verbalized understanding of follow-up arrangements:  Yes  Individual responsible for coordination of the follow-up plan: [name] or [name]  Confirmed correct DME delivered: Andria Rhein 10/18/2022    Andria Rhein

## 2022-10-18 NOTE — Progress Notes (Signed)
Physical Therapy Session Note  Patient Details  Name: [name] [name] MRN: [id number] Date of Birth: Jul 18, 1930  Today's Date: 10/18/2022 PT Individual Time: 7711-6579 PT Individual Time Calculation (min): 72 min   Short Term Goals: Week 1:  PT Short Term Goal 1 (Week 1): Patient will performed sit/stand with LRAD and MinA PT Short Term Goal 1 - Progress (Week 1): Met PT Short Term Goal 2 (Week 1): Patient will perform bed/chair transfer with LRAD and ModA x1 PT Short Term Goal 2 - Progress (Week 1): Met PT Short Term Goal 3 (Week 1): Patient will ambulate 23' with LRAD and ModA x1 PT Short Term Goal 3 - Progress (Week 1): Met Week 2:  PT Short Term Goal 1 (Week 2): STG=LTG due to LOS  Skilled Therapeutic Interventions/Progress Updates:   Received pt semi-reclined in bed with son present at bedside. Pt agreeable to PT treatment and denied any pain during session. Session with emphasis on functional mobility/transfers, generalized strengthening and endurance, simulated car transfers, stair navigation, gait training, and toileting. Pt transferred semi-reclined<>sitting EOB with HOB elevated and use of bedrails with supervision and cues for technique from son. Donned shoes with max A, then transferred bed<>WC stand<>pivot with RW and close supervision provided by son. Pt son verbalized confidence with ability to safely care for pt upon D/C and with questions for MD.   Pt [facility] for time management purposes and went through pain interference questionnaire, sensation, and MMT. RN arrived to administer medications and check vitals - BP: 101/86 and HR 81pm. In ortho gym, pt performed simulated car transfer with RW and close supervision, then ambulated 24f on uneven surfaces (ramp) with RW and CGA with cues for proximity to RW. Pt able to pick up cup while standing with RW and CGA, but upon returning to WMen[facility] Surgery Center LLCsat too early - educated pt on importance of backing up  completley to WMer[facility] Centerand reaching back prior to sitting, however pt continues to require cues to remember. Pt navigated 4 6in steps with bilateral handrails and CGA ascending and descending with a step to pattern. MD then arrived for morning rounds. Pt [facility] from [facility]with [facility] and supervision and ambulated 771fwith RW and close supervision. Took extended rest break then ambulated additional 3741fith RW and close supervision. Pt then performed WC mobility 87f1fing BUE and supervision back towards room, then reported urge to void. Stood with RW and supervision and ambulated in/out of bathroom with RW and close supervision. Pt required min A for clothing management and able to void and perform peri-care without assist. Concluded session with pt sitting in WC wGeorg[facility]Ch all needs within reach and son present at bedside.   Therapy Documentation Precautions:  Precautions Precautions: Fall Precaution Comments: Monitor oxygen Restrictions Weight Bearing Restrictions: No  Therapy/Group: Individual Therapy AnnaAlfonse Alpers DPT  10/18/2022, 7:04 AM

## 2022-10-18 NOTE — Progress Notes (Signed)
Occupational Therapy Discharge Summary  Patient Details  Name: [name] [name] MRN: [id number] Date of Birth: [birth year]  Date of Discharge from OT service:October 22, 2022   Patient has met 8 of 9 long term goals due to improved activity tolerance, improved balance, ability to compensate for deficits, and improved awareness.  Patient to discharge at overall Supervision level.  Patient's care partner is independent to provide the necessary physical assistance at discharge. Family has been present for all sessions, including the son in which pt resides with. Therapist has educated on the need of 24/7 supervision especially with toileting as pt can complete needs without physical assist but can vary depending on fatigue. Family has verbalized plans to hire caregiver for assist with showers min A for safety and daughter has verbalized that pt is at her functional baseline.  Reasons goals not met: Grooming goal not met at independent level due to need of either seated at mod I level due to bilateral tremors and compensatory strategies needed to manage, or in stance with supervision due to hemi-height w/c and inability to access sink from seated position  Recommendation:  Patient will benefit from ongoing skilled OT services in home health setting to continue to advance functional skills in the area of BADL, iADL, and Reduce care partner burden.  Equipment: 3 in 1 BSC  Reasons for discharge: treatment goals met  Patient/family agrees with progress made and goals achieved: Yes  OT Discharge Precautions/Restrictions  Precautions Precautions: Fall Restrictions Weight Bearing Restrictions: No ADL ADL Equipment Provided: Long-handled sponge Eating: Set up Where Assessed-Eating: Wheelchair Grooming: Setup Where Assessed-Grooming: Wheelchair, Other (comment) (items brought to her due to hemi height w/c and unable to sit at sink) Upper Body Bathing: Modified independent Where Assessed-Upper  Body Bathing: Shower Lower Body Bathing: Contact guard Where Assessed-Lower Body Bathing: Shower Upper Body Dressing: Setup Where Assessed-Upper Body Dressing: Wheelchair Lower Body Dressing: Supervision/safety Where Assessed-Lower Body Dressing: Sitting at sink, Standing at sink Toileting: Supervision/safety Where Assessed-Toileting: Glass blower/designer: Close supervision Toilet Transfer Method: Counselling psychologist: Energy manager: Medical sales representative Method: Ambulating, Sit pivot Tub/Shower Equipment: Radio broadcast assistant, Energy manager: Curator Method: Other (comment) (side step with RW) Advertising account executive without back Vision Baseline Vision/History: 1 Wears glasses (readers) Patient Visual Report: No change from baseline Vision Assessment?: No apparent visual deficits Perception  Perception: Within Functional Limits Praxis Praxis: Intact Cognition Cognition Overall Cognitive Status: Within Functional Limits for tasks assessed Arousal/Alertness: Awake/alert Orientation Level: Person;Place;Situation Person: Oriented Place: Oriented Situation: Oriented Memory: Appears intact Awareness: Appears intact Problem Solving: Impaired Safety/Judgment: Appears intact Brief Interview for Mental Status (BIMS) Repetition of Three Words (First Attempt): 3 Temporal Orientation: Year: Correct Temporal Orientation: Month: Accurate within 5 days Temporal Orientation: Day: Correct Recall: "Sock": No, could not recall Recall: "Blue": No, could not recall Recall: "Bed": No, could not recall BIMS Summary Score: 9 Sensation Sensation Light Touch: Appears Intact Peripheral sensation comments: Patient reports R LE sensation different compared to L LE with R LE being more dull. Light Touch Impaired Details: Impaired RLE Hot/Cold: Appears Intact Proprioception: Appears  Intact Stereognosis: Appears Intact Additional Comments: difficulty testing sensation due to language barriers but appeared grossly intact Coordination Gross Motor Movements are Fluid and Coordinated: No Fine Motor Movements are Fluid and Coordinated: No Coordination and Movement Description: mild uncoordination due to tremors, generalized weakness/deconditoning, and decreased balance strategies Finger Nose Finger Test: Bilateral tremors >  with intentional movement vs at rest Motor  Motor Motor: Abnormal postural alignment and control Motor - Skilled Clinical Observations: bilateral tremors in UEs at baseline Mobility  Bed Mobility Bed Mobility: Rolling Right;Rolling Left;Sit to Supine;Supine to Sit Rolling Right: Supervision/verbal cueing Rolling Left: Supervision/Verbal cueing Supine to Sit: Supervision/Verbal cueing Sit to Supine: Supervision/Verbal cueing Transfers Sit to Stand: Supervision/Verbal cueing Stand to Sit: Supervision/Verbal cueing  Trunk/Postural Assessment  Cervical Assessment Cervical Assessment: Exceptions to [facility] Thoracic Assessment Thoracic Assessment: Exceptions to [facility] Lumbar Assessment Lumbar Assessment: Exceptions to [facility] Postural Control Postural Control: Within Functional Limits  Balance Balance Balance Assessed: Yes Static Sitting Balance Static Sitting - Balance Support: Feet supported;No upper extremity supported Static Sitting - Level of Assistance: 5: Stand by assistance (supervision) Dynamic Sitting Balance Dynamic Sitting - Balance Support: Feet supported;No upper extremity supported Dynamic Sitting - Level of Assistance: 5: Stand by assistance (supervision) Static Standing Balance Static Standing - Balance Support: Bilateral upper extremity supported;During functional activity Static Standing - Level of Assistance: 5: Stand by assistance (supervision) Dynamic Standing Balance Dynamic Standing - Balance Support: Bilateral upper extremity  supported;During functional activity Dynamic Standing - Level of Assistance: 5: Stand by assistance (supervision) Extremity/Trunk Assessment RUE Assessment RUE Assessment: Within Functional Limits General Strength Comments: 4-/5 grossly LUE Assessment LUE Assessment: Within Functional Limits General Strength Comments: 4-/5 grossly   Blase Mess, MS, OTR/L  10/22/2022, 5:13 PM

## 2022-10-18 NOTE — Progress Notes (Addendum)
Patient ID: [name] [name], female   DOB: [birth year], 88 y.o.   MRN: [id number]  2:04 PM SW informed patient son being rude/inappropriate to therapy staff. SW went discuss concerns with son. Patient son unhappy with medical standpoint and expresses he does not agree with patient leaving on Monday due to "medical issues". Son expresses that he will not be signing anything for discharge on Monday unless he speaks with physician. SW will discuss with team. No additional concerns.

## 2022-10-18 NOTE — Progress Notes (Addendum)
Patient ID: [name] [name], female   DOB: [birth year], 88 y.o.   MRN: [id number]  CAP Referral faxed: P: [phone] F: [phone]  HH orders faxed to [facility] for FU PT/OT/ Aide  [name] [name] (Director) in discussion with family in reference to arranging [facility] services in home.   Patient granddaughter [name] [name] requesting HB and WC, sw will discuss with therapy team.  Hospital bed and Hemi WC ordered through [employer]. Granddaughter, [name] [name] informed.

## 2022-10-18 NOTE — Progress Notes (Addendum)
Incentive spirometer given and educated with pt. Pt unable to effectively use breathing technique after multiple attempts.  Mylo Red, LPN

## 2022-10-18 NOTE — Progress Notes (Signed)
Occupational Therapy Session Note  Patient Details  Name: [name] [name] MRN: [id number] Date of Birth: Jan 31, 1930  Today's Date: 10/18/2022 OT Individual Time: 4098-1191 & 1300-1415 OT Individual Time Calculation (min): 38 min & 75 min OT missed time: 22 mins Missed time reason: chest x-ray   Short Term Goals: Week 2:  OT Short Term Goal 1 (Week 2): STG = LTG due to ELOS  Skilled Therapeutic Interventions/Progress Updates:  Session 1 Skilled OT intervention completed with focus on toileting education/needs, functional transfers. Pt received supine in bed, agreeable to session. Of note- pt appeared to be confused and with increased fatigue this morning- verbalizing that she took a shower yesterday but appeared as she had not and no therapy yesterday due to the holiday, as well as expression of current incontinent BM but upon toileting had no BM present. Nurse notified of status change. Pt indicated unrated pain in low back, pt pre-medicated, with rest breaks and repositioning offered throughout for pain reduction.  Pt transitioned to EOB slowly with HOB elevated, with supervision then min A for scooting to EOB. Supervision sit > stand using RW with ambulatory transfer at [facility] level to regular toilet. Able to manage brief with supervision. Pt was heavily incontinent of void only, no further void on commode. Required several seated rest breaks between peri hygiene and toileting steps that were unprompted. Completed pericare with supervision in stance, threaded pants with supervision, sit > stands at the CGA/supervision level using RW. Ambulated to w/c with CGA and cues for positioning and positioning hips prior to sitting.  X-ray arrived indicating pt needed chest xray with preference to take pt immediately vs at end of therapy session therefore pt missed 22 mins of OT intervention, will attempt to make up missed time as schedule allows.   CGA sit > stand using RW then CGA stand pivot to EOB,  with CGA needed for bed mobility. Cues needed for bridging to boost hips up for alignment in supine. Pt remained supine in bed, with x-ray transport present at direct care handoff.  Session 2 Skilled OT intervention completed with focus on ambulatory endurance, BUE endurance, and activity tolerance. Pt received seated in w/c, with son present. No pain indicated.  Without warning, pt's son expressed dissatisfaction with this therapist's service stating "you have given up on her, and you just left her this morning." Therapist attempted explaining that pt missed time solely because x-ray services needed to take pt at the current time vs this therapist choosing to leave her and end early because of "giving up on her." Therapist explained the productivity standard that if patient is unavailable then we are expected to change our schedule or see other patients at no fault of our own. Pt's son's frustration heightened with language used towards this OT, such as "I am not signing anything on Monday when you expect her to leave in this condition" - "you do nothing to help her" - "I am not going to argue with you, I am done with you." Therapist re-attempted to alleviate frustration and determine solution for the unexpected anger towards this therapist as this OT has not been present for 2 days, however son remained uncooperative with education offered, therefore directed care to pt. Of note- pt later herself apologized for her son's behavior.  Assisted pt with toileting at the CGA sit > stand and ambulatory transfer level using RW. Min A needed for toileting of continent void due to fatigue. Ambulated back to w/c with CGA. Transported dependently  in w/c > gym for time.  Seated in w/c pt completed the following exercises with HEP issued for carryover of strengthening at home upon d/c: (With yellow theraband) 2x10 reps Horizontal abduction Self-anchored shoulder flexion each arm Self-anchored bicep flexion each  arm Bicep flexion with 2 pound wrist weights on each wrist -mod-max multimodal cues needed for form and technique  Ambulated in hallway with CGA for about 50 ft with w/c follow for fatigue. Transported dependently rest of way.  Transitioned to bed with CGA at stand pivot level using RW then min A to return supine. Pt remained semi-supine in bed with bed alarm on/activated, and with all needs in reach at end of session.   Therapy Documentation Precautions:  Precautions Precautions: Fall Precaution Comments: Monitor oxygen Restrictions Weight Bearing Restrictions: No    Therapy/Group: Individual Therapy  Blase Mess, MS, OTR/L  10/18/2022, 3:59 PM

## 2022-10-18 NOTE — Consult Note (Signed)
Ref: Zola Button, MD   Subjective:  Awake. No chest pain or palpitation. EKG; SR + 1st degree AV block + lateral ischemia. Chest XR: Small pleural effusion and bibasilar atelectasis.  Objective:  Vital Signs in the last 24 hours: Temp:  [98.2 F (36.8 C)-98.5 F (36.9 C)] 98.5 F (36.9 C) (11/23 1947) Pulse Rate:  [71-87] 81 (11/24 1011) Resp:  [14-18] 18 (11/24 0438) BP: (101-119)/(42-86) 101/86 (11/24 1011) SpO2:  [94 %-98 %] 96 % (11/24 0438) Weight:  [74.6 kg] 74.6 kg (11/24 0439)  Physical Exam: BP Readings from Last 1 Encounters:  10/18/22 101/86     Wt Readings from Last 1 Encounters:  10/18/22 74.6 kg    Weight change: -0.5 kg Body mass index is 32.12 kg/m. HEENT: [place]/AT, Eyes-Brown, Conjunctiva-Pale pink, Sclera-Non-icteric Neck: No JVD, No bruit, Trachea midline. Lungs:  Clear, Bilateral. Cardiac:  Regular rhythm, normal S1 and S2, no S3. II/VI systolic murmur. Abdomen:  Soft, non-tender. BS present. Extremities:  No edema present. No cyanosis. No clubbing. CNS: AxOx3, Cranial nerves grossly intact, moves all 4 extremities.  Skin: Warm and dry.   Intake/Output from previous day: 11/23 0701 - 11/24 0700 In: 415 [P.O.:415] Out: -     Lab Results: BMET    Component Value Date/Time   NA 134 (L) 10/07/2022 0526   NA 131 (L) 10/04/2022 0622   NA 135 10/03/2022 0135   NA 141 06/14/2022 1505   NA 141 04/26/2020 1708   NA 141 06/22/2018 1448   K 4.2 10/07/2022 0526   K 4.1 10/04/2022 0622   K 4.3 10/03/2022 0135   CL 97 (L) 10/07/2022 0526   CL 91 (L) 10/04/2022 0622   CL 91 (L) 10/03/2022 0135   CO2 27 10/07/2022 0526   CO2 32 10/04/2022 0622   CO2 35 (H) 10/03/2022 0135   GLUCOSE 131 (H) 10/07/2022 0526   GLUCOSE 131 (H) 10/04/2022 0622   GLUCOSE 132 (H) 10/03/2022 0135   BUN 18 10/07/2022 0526   BUN 17 10/04/2022 0622   BUN 17 10/03/2022 0135   BUN 17 06/14/2022 1505   BUN 19 04/26/2020 1708   BUN 14 06/22/2018 1448   CREATININE 0.72  10/07/2022 0526   CREATININE 0.75 10/04/2022 0622   CREATININE 0.75 10/03/2022 0135   CALCIUM 8.3 (L) 10/07/2022 0526   CALCIUM 8.3 (L) 10/04/2022 0622   CALCIUM 8.6 (L) 10/03/2022 0135   GFRNONAA >60 10/07/2022 0526   GFRNONAA >60 10/04/2022 0622   GFRNONAA >60 10/03/2022 0135   GFRAA 69 04/26/2020 1708   GFRAA >60 06/15/2019 0841   GFRAA >60 11/13/2018 1941   CBC    Component Value Date/Time   WBC 5.5 10/18/2022 0604   RBC 3.78 (L) 10/18/2022 0604   HGB 10.7 (L) 10/18/2022 0604   HGB 15.2 04/26/2020 1708   HCT 33.5 (L) 10/18/2022 0604   HCT 46.5 04/26/2020 1708   PLT 206 10/18/2022 0604   PLT 102 (L) 04/26/2020 1708   MCV 88.6 10/18/2022 0604   MCV 94 04/26/2020 1708   MCH 28.3 10/18/2022 0604   MCHC 31.9 10/18/2022 0604   RDW 14.6 10/18/2022 0604   RDW 12.8 04/26/2020 1708   LYMPHSABS 1.7 10/14/2022 0744   LYMPHSABS 1.6 04/26/2020 1708   MONOABS 0.5 10/14/2022 0744   EOSABS 0.1 10/14/2022 0744   EOSABS 0.0 04/26/2020 1708   BASOSABS 0.1 10/14/2022 0744   BASOSABS 0.0 04/26/2020 1708   HEPATIC Function Panel Recent Labs    10/03/22 0135  10/04/22 0622 10/07/22 0526  PROT 5.8* 5.9* 6.2*  ALBUMIN 2.7* 2.7* 2.7*  AST 101* 93* 64*  ALT 160* 146* 106*  ALKPHOS 88 101 89   HEMOGLOBIN A1C Lab Results  Component Value Date   MPG 126 (H) 07/18/2014   CARDIAC ENZYMES Lab Results  Component Value Date   TROPONINI <0.30 03/24/2012   BNP No results for input(s): "PROBNP" in the last 8760 hours. TSH Recent Labs    09/16/22 1929  TSH 1.792   CHOLESTEROL Recent Labs    06/14/22 1505 09/17/22 0207  CHOL 180 161    Scheduled Meds:  amiodarone  100 mg Oral Daily   apixaban  2.5 mg Oral BID   atorvastatin  10 mg Oral QPM   cycloSPORINE  1 drop Both Eyes BID   digoxin  0.0625 mg Oral Daily   diltiazem  120 mg Oral Daily   gabapentin  100 mg Oral TID   lactose free nutrition  237 mL Oral TID WC   levETIRAcetam  500 mg Oral BID   magnesium oxide  400 mg  Oral Daily   metoprolol succinate  50 mg Oral QPM   multivitamin with minerals  1 tablet Oral Daily   pantoprazole  40 mg Oral BID   polyethylene glycol  17 g Oral BID   potassium chloride  20 mEq Oral Daily   torsemide  10 mg Oral Daily   Vitamin D (Ergocalciferol)  50,000 Units Oral Q Wed   Continuous Infusions: PRN Meds:.acetaminophen, diclofenac Sodium, guaiFENesin, mouth rinse, senna, simethicone  Assessment/Plan: Paroxysmal atrial fibrillation Dehydration, improving Mild pleural effusion with basal atelectasis Chronic diastolic left heart failure Acute right lower extremity DVT Recent GI bleed Anemia of blood loss HTN HLD Old stroke Arthritis Parkinson's disease  Incentive spirometry frequent use, nurse notified. Showed patient how to do belly breathing. Awaiting CMET.   LOS: 14 days   Time spent including chart review, lab review, examination, discussion with patient/Son/Nurse : 30 min   Orpah Cobb  MD  10/18/2022, 12:12 PM

## 2022-10-18 NOTE — Progress Notes (Addendum)
PROGRESS NOTE   Subjective/Complaints: Working with therapy. No new concerns this AM. Reports her feet pain is improved. Had  CXR this AM with persistent small pleural effusions and atelectasis noted.  Dr. Octavia Heir following- appreciate assistance. I ordered Vas [place] today RLE for follow up.   ROS- denies CP, SOB, HA,  cough, HA,  N/V/D, abdominal pain, +chronic tremor B/L feet tingling-improved  Objective:   DG Chest 2 View  Result Date: 10/18/2022 CLINICAL DATA:  CHF.  Shortness of breath. EXAM: CHEST - 2 VIEW COMPARISON:  Chest radiograph 09/28/2022 and chest CTA 10/03/2022 FINDINGS: The cardiac silhouette remains enlarged. Aortic atherosclerosis is noted. Lung volumes remain low with small pleural effusions and bibasilar opacities favored to reflect atelectasis. No pneumothorax or overt pulmonary edema is evident. No acute osseous abnormality is seen. IMPRESSION: Persistent small pleural effusions and bibasilar atelectasis. Electronically Signed   By: Logan Bores M.D.   On: 10/18/2022 09:02   Recent Labs    10/18/22 0604  WBC 5.5  HGB 10.7*  HCT 33.5*  PLT 206    No results for input(s): "NA", "K", "CL", "CO2", "GLUCOSE", "BUN", "CREATININE", "CALCIUM" in the last 72 hours.   Intake/Output Summary (Last 24 hours) at 10/18/2022 1320 Last data filed at 10/18/2022 0847 Gross per 24 hour  Intake 535 ml  Output --  Net 535 ml         Physical Exam: Vital Signs Blood pressure 101/86, pulse 81, temperature 98.5 F (36.9 C), temperature source Oral, resp. rate 18, height 5' (1.524 m), weight 74.6 kg, SpO2 96 %. General: No acute distress,  working with therapy in gym BMI 32.68 Mood and affect are appropriate, pleasant, Heart: Regular rate and rhythm,  no JVD HEENT: tremor of head, MMM Lungs: Clear to auscultation, breathing unlabored, no rales or wheezes Abdomen: Positive bowel sounds, soft nontender to palpation,  nondistended Extremities: No clubbing, cyanosis, or edema Skin: No evidence of breakdown, no evidence of rash Neurologic: Cranial nerves II through XII intact, motor strength is 5/5 in left and 3/5 right  deltoid, bicep, tricep, grip, hip flexor, knee extensors, ankle dorsiflexor and plantar flexor Sensory exam hypersensitive to touch RLE, sensation intact all 4 extremities to LT  Cerebellar exam- intention tremor RUE, chronic tremor of head Musculoskeletal: Full range of motion in all 4 extremities. No joint swelling, no evidence of Right knee or ankle , swelling, effusion no erythema. Tenderness around R knee-decreased   Assessment/Plan: 1. Functional deficits which require 3+ hours per day of interdisciplinary therapy in a comprehensive inpatient rehab setting. Physiatrist is providing close team supervision and 24 hour management of active medical problems listed below. Physiatrist and rehab team continue to assess barriers to discharge/monitor patient progress toward functional and medical goals  Care Tool:  Bathing    Body parts bathed by patient: Right arm, Left arm, Chest, Abdomen, Right upper leg, Left upper leg, Face   Body parts bathed by helper: Front perineal area, Buttocks, Right lower leg, Left lower leg     Bathing assist Assist Level: Moderate Assistance - Patient 50 - 74%     Upper Body Dressing/Undressing Upper body dressing   What is the  patient wearing?: Pull over shirt    Upper body assist Assist Level: Set up assist    Lower Body Dressing/Undressing Lower body dressing      What is the patient wearing?: Pants, Underwear/pull up     Lower body assist Assist for lower body dressing: Contact Guard/Touching assist     Toileting Toileting    Toileting assist Assist for toileting: Supervision/Verbal cueing     Transfers Chair/bed transfer  Transfers assist     Chair/bed transfer assist level: Supervision/Verbal cueing      Locomotion Ambulation   Ambulation assist   Ambulation activity did not occur: Safety/medical concerns (Unable to assess gait, stair mobility or picking up an item from the floor secondary to decreased strength, increased tremors, impaired endurance/activity tolerance and poor dynamic stability- Therapist to assess this afternoon if able.)  Assist level: Supervision/Verbal cueing Assistive device: Walker-rolling Max distance: 70ft   Walk 10 feet activity   Assist  Walk 10 feet activity did not occur: Safety/medical concerns  Assist level: Supervision/Verbal cueing Assistive device: Walker-rolling   Walk 50 feet activity   Assist Walk 50 feet with 2 turns activity did not occur: Safety/medical concerns  Assist level: Supervision/Verbal cueing Assistive device: Walker-rolling    Walk 150 feet activity   Assist Walk 150 feet activity did not occur: Safety/medical concerns (fatigue, weakness, decreased endurance)         Walk 10 feet on uneven surface  activity   Assist Walk 10 feet on uneven surfaces activity did not occur: Safety/medical concerns   Assist level: Contact Guard/Touching assist Assistive device: Walker-rolling   Wheelchair     Assist Is the patient using a wheelchair?: Yes Type of Wheelchair: Manual    Wheelchair assist level: Supervision/Verbal cueing Max wheelchair distance: 78ft    Wheelchair 50 feet with 2 turns activity    Assist        Assist Level: Total Assistance - Patient < 25%   Wheelchair 150 feet activity     Assist      Assist Level: Total Assistance - Patient < 25%   Blood pressure 101/86, pulse 81, temperature 98.5 F (36.9 C), temperature source Oral, resp. rate 18, height 5' (1.524 m), weight 74.6 kg, SpO2 96 %.  Medical Problem List and Plan: 1. Functional deficits secondary to debility/multi medical including left thalamic posterior limb infarction 2015 receiving inpatient rehab services  07/21/2014 - 07/30/2014             -patient may shower             -ELOS/Goals: 11/27 Min A  FULL CODE as per discussion with son  Discussed extension with Vicente Males and [facility] as per familly's request, but patient is likely to meet supervision goals by Sunday  -Continue CIR PT/OT 2.  Acute on chronic PE: Discussed with family that it can take time for these to break up and dissolve. -DVT/anticoagulation: Venous Doppler study 10/02/2022 findings consistent with age-indeterminate DVT right posterior tibial vein as well as CT angiogram of the chest showing acute on chronic pulmonary emboli.   Change IV heparin to low dose Eliquis as per GI - Per documentation will need serial [place] for RLE DVT on 11/13 and 11/22              -antiplatelet therapy: N/A  -Repeat U/S ordered 11/24 RLE ordered   3. Pain Management: N/a, discuss with sons weaning medications if pain is well controlled. D/c Neurontin 100 mg nightly, discontinue cymbalta since  not having pain, Voltaren gel as needed. Provide list of foods for pain. Continue Tylenol. 4. Mood/Behavior/Sleep: Provide emotional support             -antipsychotic agents: N/A 5. Neuropsych/cognition: This patient is capable of making decisions on her own behalf. 6. Skin/Wound Care: Routine skin checks 7. Fluids/Electrolytes/Nutrition: Routine in and outs with follow-up chemistries 8.  GI bleed.  Status post upper GI endoscopy 09/26/2022.  Continue PPI twice daily. Cleared to restart Eliquis by GI. Discussed increased risk of bleeding.  9.  Acute respiratory failure requiring noninvasive imaging treated with aggressive diuresis.  Check oxygen saturations every shift. Wean oxygen as tolerated.  10.  Acute on chronic diastolic congestive heart failure.  Continue Demadex 20 mg daily.  Monitor for any signs of fluid overload.  Daily weights. Weight is down, continue to monitor daily              - Patient was using supplemental oxygen as needed prior to admission.   Filed  Weights   10/16/22 0342 10/17/22 0458 10/18/22 0439  Weight: 75.3 kg 75.1 kg 74.6 kg  11/24 weight stable, Cardiology Dr. Christen Bame following, torsemide was decreased yesterday, continue to monitor,    11.  New onset atrial fibrillation with RVR.  Amiodarone 200 mg daily.  Follow-up cardiology service Dr. Algie Coffer.  Status post DCCV 09/24/2022.  Continue Cardizem 30 mg every 8 hours, Lanoxin 0.0625 mg daily, Toprol 50 mg daily. Magnesium level reviewed and is normal 11/24 Dr. Christen Bame ordered EKG and CXR, advised Incentive spirometer 12.  Elevated LFTs.  Felt to be possibly due to amiodarone.  Hepatitis panel negative follow-up labs. -11/24 recheck labs today 13.  Seizure disorder.  Longstanding Keppra 500 mg twice daily, continue 14.  Hyperlipidemia.  Lipitor 15.  Ileus.  Resolving.  Diet advanced to mechanical soft. LBM <24 hours per son.  -11/21 Increase miralax to BID -11/22 BM today, improved, continue to monitor -11/23 BM today, continue to monitor 16.  Obesity.  BMI 33.20-->32.21:  Dietary follow-up 17.  History of type 2 diabetes mellitus, latest hemoglobin A1c 5.4.  18.  UTI.  80,000 gram-positive cocci/30,000 gram-negative rod.  Initially on Omnicef transition to amoxicillin.  19. Hx essential tremor, severe. On longstanding primidone 250 mg BID, low-dose gabapentin, cymbalta, keppra.       - Per son, can perform ADLs including self feeding/grooming at home.       - Follows closely with OP neurology - Restart gabapentin may help 20. Suboptimal vitamin D level: Level is 33, ergocalciferol 50,000U once per week for 7 weeks started. 21. Diastolic hypotension: discussed with Dr. Algie Coffer. EKG obtained and reviewed. Appreciate Dr. Algie Coffer seeing her. Amiodarone dose reduced by 50%  -11/24 she was seen by cardiology today and yesterday 22. History of right foot fracture with current pain: will order XR- no acute changes noted 23. Knee OA. Check xray R knee and tib/fib- Knee OA.  -Continue  voltaren   24. B/L feet tingling pain  -11/22 Restart Neurontin 100mg  HS  -11/23 increase Neurontin dose to 100 TID  -11/24 reports improved  Later addendum  Cr elevated on labs today, spoke with Dr.  05-31[birth year]1994, advised no med changes or IVF as  he cut torsemide by 50%, says she gets CHF easy, continue to monitor   LOS: 14 days A FACE TO FACE EVALUATION WAS PERFORMED  Christen Bame 10/18/2022, 1:20 PM

## 2022-10-19 ENCOUNTER — Inpatient Hospital Stay (HOSPITAL_COMMUNITY): Payer: Medicare Other

## 2022-10-19 ENCOUNTER — Encounter (HOSPITAL_COMMUNITY): Payer: Medicare Other

## 2022-10-19 DIAGNOSIS — I82541 Chronic embolism and thrombosis of right tibial vein: Secondary | ICD-10-CM

## 2022-10-19 LAB — BASIC METABOLIC PANEL
Anion gap: 12 (ref 5–15)
BUN: 39 mg/dL — ABNORMAL HIGH (ref 8–23)
CO2: 30 mmol/L (ref 22–32)
Calcium: 9.4 mg/dL (ref 8.9–10.3)
Chloride: 94 mmol/L — ABNORMAL LOW (ref 98–111)
Creatinine, Ser: 1.33 mg/dL — ABNORMAL HIGH (ref 0.44–1.00)
GFR, Estimated: 38 mL/min — ABNORMAL LOW (ref >60)
Glucose, Bld: 120 mg/dL — ABNORMAL HIGH (ref 70–99)
Potassium: 4.4 mmol/L (ref 3.5–5.1)
Sodium: 136 mmol/L (ref 135–145)

## 2022-10-19 NOTE — Consult Note (Signed)
Ref: [name], MD   Subjective:  Feeling better.  Improved BP and creatinine level. VS stable.  Objective:  Vital Signs in the last 24 hours: Temp:  [97.9 F (36.6 C)-98.7 F (37.1 C)] 98.7 F (37.1 C) (11/25 0552) Pulse Rate:  [62-83] 76 (11/25 0552) Resp:  [16-18] 18 (11/25 0552) BP: (112-133)/(47-59) 119/59 (11/25 0552) SpO2:  [94 %-98 %] 98 % (11/25 0552) Weight:  [74 kg] 74 kg (11/25 0800)  Physical Exam: BP Readings from Last 1 Encounters:  10/19/22 (!) 119/59     Wt Readings from Last 1 Encounters:  10/19/22 74 kg    Weight change:  Body mass index is 31.86 kg/m. HEENT: [place]/AT, Eyes-Brown, Conjunctiva-Pale pink, Sclera-Non-icteric Neck: No JVD, No bruit, Trachea midline. Lungs:  Clear, Bilateral. Cardiac:  Regular rhythm, normal S1 and S2, no S3. II/VI systolic murmur. Abdomen:  Soft, non-tender. BS present. Extremities:  No edema present. No cyanosis. No clubbing. CNS: AxOx3, Cranial nerves grossly intact, moves all 4 extremities.  Skin: Warm and dry.   Intake/Output from previous day: 11/24 0701 - 11/25 0700 In: 540 [P.O.:540] Out: -     Lab Results: BMET    Component Value Date/Time   NA 136 10/19/2022 0651   NA 135 10/18/2022 1448   NA 134 (L) 10/07/2022 0526   NA 141 06/14/2022 1505   NA 141 04/26/2020 1708   NA 141 06/22/2018 1448   K 4.4 10/19/2022 0651   K 4.2 10/18/2022 1448   K 4.2 10/07/2022 0526   CL 94 (L) 10/19/2022 0651   CL 94 (L) 10/18/2022 1448   CL 97 (L) 10/07/2022 0526   CO2 30 10/19/2022 0651   CO2 28 10/18/2022 1448   CO2 27 10/07/2022 0526   GLUCOSE 120 (H) 10/19/2022 0651   GLUCOSE 137 (H) 10/18/2022 1448   GLUCOSE 131 (H) 10/07/2022 0526   BUN 39 (H) 10/19/2022 0651   BUN 38 (H) 10/18/2022 1448   BUN 18 10/07/2022 0526   BUN 17 06/14/2022 1505   BUN 19 04/26/2020 1708   BUN 14 06/22/2018 1448   CREATININE 1.33 (H) 10/19/2022 0651   CREATININE 1.94 (H) 10/18/2022 1448   CREATININE 0.72 10/07/2022 0526    CALCIUM 9.4 10/19/2022 0651   CALCIUM 9.3 10/18/2022 1448   CALCIUM 8.3 (L) 10/07/2022 0526   GFRNONAA 38 (L) 10/19/2022 0651   GFRNONAA 24 (L) 10/18/2022 1448   GFRNONAA >60 10/07/2022 0526   GFRAA 69 04/26/2020 1708   GFRAA >60 06/15/2019 0841   GFRAA >60 11/13/2018 1941   CBC    Component Value Date/Time   WBC 5.5 10/18/2022 0604   RBC 3.78 (L) 10/18/2022 0604   HGB 10.7 (L) 10/18/2022 0604   HGB 15.2 04/26/2020 1708   HCT 33.5 (L) 10/18/2022 0604   HCT 46.5 04/26/2020 1708   PLT 206 10/18/2022 0604   PLT 102 (L) 04/26/2020 1708   MCV 88.6 10/18/2022 0604   MCV 94 04/26/2020 1708   MCH 28.3 10/18/2022 0604   MCHC 31.9 10/18/2022 0604   RDW 14.6 10/18/2022 0604   RDW 12.8 04/26/2020 1708   LYMPHSABS 1.7 10/14/2022 0744   LYMPHSABS 1.6 04/26/2020 1708   MONOABS 0.5 10/14/2022 0744   EOSABS 0.1 10/14/2022 0744   EOSABS 0.0 04/26/2020 1708   BASOSABS 0.1 10/14/2022 0744   BASOSABS 0.0 04/26/2020 1708   HEPATIC Function Panel Recent Labs    10/04/22 0622 10/07/22 0526 10/18/22 1448  PROT 5.9* 6.2* 7.5  ALBUMIN 2.7*  2.7* 3.3*  AST 93* 64* 42*  ALT 146* 106* 56*  ALKPHOS 101 89 91   HEMOGLOBIN A1C Lab Results  Component Value Date   MPG 126 (H) 07/18/2014   CARDIAC ENZYMES Lab Results  Component Value Date   TROPONINI <0.30 03/24/2012   BNP No results for input(s): "PROBNP" in the last 8760 hours. TSH Recent Labs    09/16/22 1929  TSH 1.792   CHOLESTEROL Recent Labs    06/14/22 1505 09/17/22 0207  CHOL 180 161    Scheduled Meds:  amiodarone  100 mg Oral Daily   apixaban  2.5 mg Oral BID   atorvastatin  10 mg Oral QPM   cycloSPORINE  1 drop Both Eyes BID   digoxin  0.0625 mg Oral Daily   diltiazem  120 mg Oral Daily   gabapentin  100 mg Oral TID   lactose free nutrition  237 mL Oral TID WC   levETIRAcetam  500 mg Oral BID   magnesium oxide  400 mg Oral Daily   metoprolol succinate  50 mg Oral QPM   multivitamin with minerals  1 tablet  Oral Daily   pantoprazole  40 mg Oral BID   polyethylene glycol  17 g Oral BID   potassium chloride  20 mEq Oral Daily   torsemide  10 mg Oral Daily   Vitamin D (Ergocalciferol)  50,000 Units Oral Q Wed   Continuous Infusions: PRN Meds:.acetaminophen, diclofenac Sodium, guaiFENesin, mouth rinse, senna, simethicone  Assessment/Plan: Paroxysmal atrial fibrillation Dehydration, improving Mild pleural effusion with basilar atelectasis Chronic diastolic left heart failure Recent right lower extremity DVT Recent GI bleed Anemia of blood loss HTN HLD Old stroke Arthritis Parkinson's diseasse  Plan: Continue medical treatment.   LOS: 15 days   Time spent including chart review, lab review, examination, discussion with patient/Son : 30 min   Dixie Dials  MD  10/19/2022, 11:11 AM

## 2022-10-19 NOTE — Progress Notes (Signed)
VASCULAR LAB    Right lower extremity venous duplex has been performed.  See CV proc for preliminary results.   Tephanie Escorcia, RVT 10/19/2022, 4:20 PM

## 2022-10-19 NOTE — Discharge Summary (Signed)
Physician Discharge Summary  Patient ID: [name] [name] DOB/AGE: [birth year] 88 y.o.  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 Patient in rehabilitation.  Risk Factors: Immobility DVT 10/02/22 right age indeterminate posterior tibial DVT. Comparison Study: Prior positive right LEV done 10/02/22 Performing Technologist: Sharion Dove RVS  Examination Guidelines: A complete evaluation includes B-mode imaging, spectral Doppler, color Doppler, and power Doppler as needed of all accessible portions of each vessel. Bilateral testing is considered an integral part of a complete examination. Limited examinations for reoccurring indications may be performed as noted. The reflux portion of the exam is performed with the patient in reverse Trendelenburg.  +---------+---------------+---------+-----------+----------+--------------+ RIGHT     CompressibilityPhasicitySpontaneityPropertiesThrombus Aging +---------+---------------+---------+-----------+----------+--------------+ CFV      Full           Yes      Yes                                 +---------+---------------+---------+-----------+----------+--------------+ SFJ      Full                                                        +---------+---------------+---------+-----------+----------+--------------+ FV Prox  Full                                                        +---------+---------------+---------+-----------+----------+--------------+ FV Mid   Full                                                        +---------+---------------+---------+-----------+----------+--------------+ FV DistalFull                                                        +---------+---------------+---------+-----------+----------+--------------+ PFV      Full                                                        +---------+---------------+---------+-----------+----------+--------------+  POP      Full           Yes      Yes                                 +---------+---------------+---------+-----------+----------+--------------+ PTV      Partial                                      Chronic        +---------+---------------+---------+-----------+----------+--------------+ PERO     Full                                                        +---------+---------------+---------+-----------+----------+--------------+   +----+---------------+---------+-----------+----------+--------------+ LEFTCompressibilityPhasicitySpontaneityPropertiesThrombus Aging +----+---------------+---------+-----------+----------+--------------+ CFV Full           Yes      Yes                                 +----+---------------+---------+-----------+----------+--------------+     Summary: RIGHT: - Findings consistent with chronic deep vein thrombosis  involving the right posterior tibial veins.  LEFT: - No evidence of common femoral vein obstruction.  *See table(s) above for measurements and observations. Electronically signed by Harold Barban MD on 10/19/2022 at 8:51:34 PM.    Final    DG Chest 2 View  Result Date: 10/18/2022 CLINICAL DATA:  CHF.  Shortness of breath. EXAM: CHEST - 2 VIEW COMPARISON:  Chest radiograph 09/28/2022 and chest CTA 10/03/2022 FINDINGS: The cardiac silhouette remains enlarged. Aortic atherosclerosis is noted. Lung volumes remain low with small pleural effusions and bibasilar opacities favored to reflect atelectasis. No pneumothorax or overt pulmonary edema is evident. No acute osseous abnormality is seen. IMPRESSION: Persistent small pleural effusions and bibasilar atelectasis. Electronically Signed   By: Logan Bores M.D.   On: 10/18/2022 09:02   DG Knee 1-2 Views Right  Result Date: 10/15/2022 CLINICAL DATA:  Pain. EXAM: RIGHT KNEE - 1-2 VIEW COMPARISON:  None Available. FINDINGS: No evidence for an acute fracture no subluxation or dislocation. Calcification of the lateral meniscus evident with subtle hypertrophic spurring in all 3 compartments. No worrisome lytic or sclerotic osseous abnormality. No evidence for joint effusion. IMPRESSION: Mild tricompartmental degenerative changes with chondrocalcinosis of the lateral meniscus. Electronically Signed   By: Misty Stanley M.D.   On: 10/15/2022 15:13   DG Tibia/Fibula Right  Result Date: 10/15/2022 CLINICAL DATA:  Pain. EXAM: RIGHT TIBIA AND FIBULA - 2 VIEW COMPARISON:  None Available. FINDINGS: No evidence for an acute fracture in the tibia or fibula. No worrisome lytic or sclerotic osseous abnormality. Anatomy of the knee better characterized on dedicated knee films performed at the same time and reported separately. IMPRESSION: No acute bony findings in the tibia or fibula. Electronically Signed   By: Misty Stanley M.D.   On: 10/15/2022 15:12   DG Foot 2 Views  Right  Result Date: 10/14/2022 CLINICAL DATA:  Right foot pain. EXAM: RIGHT FOOT - 2 VIEW COMPARISON:  Right ankle radiographs 07/08/2013 FINDINGS: Postsurgical changes of partial resection of the distal medial aspect of the great toe metatarsal with a pin  traversing a distal metatarsal neck remote healed osteotomy. Moderate second PIP and mild rest of the second through fifth interphalangeal joint space narrowing. Tiny plantar calcaneal heel spur. Minimal chronic enthesopathic change at the Achilles insertion on the calcaneus. Mild-to-moderate distal medial malleolar degenerative spurring. No acute fracture is seen. No dislocation. IMPRESSION: Remote great toe metatarsal bunionectomy/osteotomy. Moderate degenerative changes of the second toe PIP joint. No acute fracture or dislocation. Electronically Signed   By: Yvonne Kendall M.D.   On: 10/14/2022 13:02   CT Angio Chest Pulmonary Embolism (PE) W or WO Contrast  Result Date: 10/03/2022 CLINICAL DATA:  DVT and prolonged immobilization. EXAM: CT ANGIOGRAPHY CHEST WITH CONTRAST TECHNIQUE: Multidetector CT imaging of the chest was performed using the standard protocol during bolus administration of intravenous contrast. Multiplanar CT image reconstructions and MIPs were obtained to evaluate the vascular anatomy. RADIATION DOSE REDUCTION: This exam was performed according to the departmental dose-optimization program which includes automated exposure control, adjustment of the mA and/or kV according to patient size and/or use of iterative reconstruction technique. CONTRAST:  20mL OMNIPAQUE IOHEXOL 350 MG/ML SOLN COMPARISON:  Chest radiograph 09/28/2022 FINDINGS: Despite efforts by the technologist and patient, motion artifact is present on today's exam and could not be eliminated. This reduces exam sensitivity and specificity. Cardiovascular: There is substantial primarily chronic pulmonary embolus with notable peripheral thrombus including in the right and left  pulmonary arteries. Scattered areas of likely acute superimposed pulmonary embolus, for example in the left lower lobe on image 231 series 7 although also in the bilateral upper lobes. Right ventricular to left ventricular ratio is elevated at 1.3. Intervertebral calor septal thickness of 1.8 cm compatible with left ventricular hypertrophy. Mild mitral and aortic valve calcification. Coronary, aortic arch, and branch vessel atherosclerotic vascular disease. Mediastinum/Nodes: Probable wall thickening in the distal half of the thoracic esophagus. Reflux would be a common cause. Lungs/Pleura: Moderate right trace left pleural effusion with passive atelectasis. Airway thickening and airway plugging in the left lower lobe. Nonspecific mild peripheral airspace opacity anteriorly in the left upper lobe measuring 1.4 by 0.9 cm on image 69 series 6. Upper Abdomen: Abdominal aortic atherosclerosis. There is atheromatous calcification proximally in the superior mesenteric artery. Musculoskeletal: L1 compression fracture as shown on 09/30/2022. Thoracic spondylosis with multilevel bridging spurring. Review of the MIP images confirms the above findings. IMPRESSION: 1. Acute on chronic pulmonary embolus with the bulk of the filling defect in the pulmonary arterial tree peripheral compatible with chronic embolus. Scattered areas of likely acute superimposed pulmonary embolus in the bilateral upper lobes and left lower lobe. Positive for acute PE with CT evidence of right heart strain (RV/LV Ratio = 1.3 ) consistent with at least submassive (intermediate risk) PE. The presence of right heart strain has been associated with an increased risk of morbidity and mortality. Please refer to the "Code PE Focused" order set in EPIC. 2. Moderate right and trace left pleural effusion with passive atelectasis. 3. Airway thickening and airway plugging in the left lower lobe. 4. There is some focal consolidation anteriorly in the left upper  lobe which is nonspecific. However this could be from a small pulmonary infarct. 5. Left ventricular hypertrophy and cardiomegaly. 6. Coronary and aortic atherosclerosis along with atherosclerosis in the proximal superior mesenteric artery. Mild mitral and aortic valve calcification. 7. Chronic L1 compression fracture. Aortic Atherosclerosis (ICD10-I70.0). Critical Value/emergent results were called by telephone at the time of interpretation on 10/03/2022 at 7:06 pm to provider Dr. Sherrye Payor, who verbally acknowledged  these results. Electronically Signed   By: Van Clines M.D.   On: 10/03/2022 19:06   VAS [place] LOWER EXTREMITY VENOUS (DVT)  Result Date: 10/03/2022  Lower Venous DVT Study Patient Name:  [name] [name]  Date of Exam:   10/02/2022 Medical Rec #: [id number]         Accession #:    [id number] Date of Birth: 27-Sep-1930        Patient Gender: F Patient Age:   68 years Exam Location:  [facility] Procedure:      VAS [place] LOWER EXTREMITY VENOUS (DVT) Referring Phys: Marye Round MCINTYRE --------------------------------------------------------------------------------  Indications: Pain in right leg.  Comparison Study: 09-22-2022 Prior bilateral lower extremity venous was negative                   for DVT. Performing Technologist: Darlin Coco RDMS, RVT  Examination Guidelines: A complete evaluation includes B-mode imaging, spectral Doppler, color Doppler, and power Doppler as needed of all accessible portions of each vessel. Bilateral testing is considered an integral part of a complete examination. Limited examinations for reoccurring indications may be performed as noted. The reflux portion of the exam is performed with the patient in reverse Trendelenburg.  +---------+---------------+---------+-----------+----------+-----------------+ RIGHT    CompressibilityPhasicitySpontaneityPropertiesThrombus Aging    +---------+---------------+---------+-----------+----------+-----------------+ CFV       Full           Yes      Yes                                    +---------+---------------+---------+-----------+----------+-----------------+ SFJ      Full                                                           +---------+---------------+---------+-----------+----------+-----------------+ FV Prox  Full                                                           +---------+---------------+---------+-----------+----------+-----------------+ FV Mid   Full                                                           +---------+---------------+---------+-----------+----------+-----------------+ FV DistalFull                                                           +---------+---------------+---------+-----------+----------+-----------------+ PFV      Full                                                           +---------+---------------+---------+-----------+----------+-----------------+  POP      Full           Yes      Yes                                    +---------+---------------+---------+-----------+----------+-----------------+ PTV      Partial        Yes      Yes                  Age Indeterminate +---------+---------------+---------+-----------+----------+-----------------+ PERO     Full                                                           +---------+---------------+---------+-----------+----------+-----------------+   +----+---------------+---------+-----------+----------+--------------+ LEFTCompressibilityPhasicitySpontaneityPropertiesThrombus Aging +----+---------------+---------+-----------+----------+--------------+ CFV Full           Yes      Yes                                 +----+---------------+---------+-----------+----------+--------------+     Summary: RIGHT: - Findings consistent with age indeterminate deep vein thrombosis involving the right posterior tibial veins. - No cystic structure found in the popliteal  fossa.  LEFT: - No evidence of common femoral vein obstruction.  *See table(s) above for measurements and observations. Electronically signed by Gerarda Fraction on 10/03/2022 at 2:35:33 PM.    Final    CT ABDOMEN PELVIS W CONTRAST  Result Date: 09/30/2022 CLINICAL DATA:  Acute nonlocalized abdominal pain. EXAM: CT ABDOMEN AND PELVIS WITH CONTRAST TECHNIQUE: Multidetector CT imaging of the abdomen and pelvis was performed using the standard protocol following bolus administration of intravenous contrast. RADIATION DOSE REDUCTION: This exam was performed according to the departmental dose-optimization program which includes automated exposure control, adjustment of the mA and/or kV according to patient size and/or use of iterative reconstruction technique. CONTRAST:  20mL OMNIPAQUE IOHEXOL 350 MG/ML SOLN COMPARISON:  Ultrasound 09/26/2022. FINDINGS: Lower chest: Moderate right and small left pleural effusion. Associated compressive atelectasis in the lung bases. Hepatobiliary: Tiny hypodensity in the inferior right lobe is too small to characterize but likely small cyst. No suspicious liver lesion. Gallbladder physiologically distended, no calcified stone. No biliary dilatation. Pancreas: Mildly atrophic. No ductal dilatation or inflammation. No pancreatic mass. Spleen: Normal in size without focal abnormality. Adrenals/Urinary Tract: Normal adrenal glands. No hydronephrosis. No evidence of solid renal lesion or stone. Unremarkable urinary bladder. Stomach/Bowel: Partially distended stomach. Mild gaseous distention of small bowel in the lower abdomen, up to 3.5 cm. Enteric contrast progresses distal to this to the level of the colon. There is no associated wall thickening or inflammation. The appendix is not confidently visualized. There is mild colonic distension of the ascending, transverse and descending colon with air and stool. The sigmoid colon is less distended, no definite wall thickening. Vascular/Lymphatic:  Prominent aortic atherosclerosis. Aortic tortuosity without aneurysm. Retroaortic left renal vein. Patent portal and splenic veins. No enlarged lymph nodes in the abdomen or pelvis. Reproductive: Abnormal endometrial distension and or fluid at 13 mm. No adnexal mass. Other: No free air, free fluid, or intra-abdominal fluid collection. Moderate-sized fat containing umbilical hernia. Musculoskeletal: L4 compression deformity post vertebral plasty. Moderate L1 superior endplate compression  deformity that appears chronic there is no evidence of focal bone lesion or acute osseous findings. IMPRESSION: 1. Mild gaseous distention of small bowel in the lower abdomen, up to 3.5 cm. Enteric contrast progresses distal to this to the level of the colon. There is mild colonic distension of the ascending, transverse and descending colon with air and stool. Findings are suggestive of ileus. 2. Abnormal endometrial distension and or fluid at 13 mm. Recommend further evaluation with pelvic ultrasound. 3. Moderate right and small left pleural effusion. Associated compressive atelectasis in the lung bases. 4. Moderate-sized fat containing umbilical hernia. Aortic Atherosclerosis (ICD10-I70.0). Electronically Signed   By: Keith Rake M.D.   On: 09/30/2022 22:32   DG CHEST PORT 1 VIEW  Result Date: 09/28/2022 CLINICAL DATA:  Dyspnea. EXAM: PORTABLE CHEST 1 VIEW COMPARISON:  Radiograph yesterday. FINDINGS: Stable cardiomegaly. Unchanged mediastinal contours. Aortic atherosclerosis. Bibasilar collapse/consolidation with pleural effusions, without significant interval change. No new airspace disease. No pneumothorax. No pulmonary edema IMPRESSION: 1. Unchanged radiographic appearance of the chest with bibasilar collapse/consolidation and pleural effusions. 2. Stable cardiomegaly. Electronically Signed   By: Keith Rake M.D.   On: 09/28/2022 15:20   VAS [place] UPPER EXTREMITY VENOUS DUPLEX  Result Date: 09/28/2022 UPPER VENOUS  STUDY  Patient Name:  [name] [name]  Date of Exam:   09/27/2022 Medical Rec #: [id number]         Accession #:    [id number] Date of Birth: 1930-05-30        Patient Gender: F Patient Age:   17 years Exam Location:  [facility] Procedure:      VAS [place] UPPER EXTREMITY VENOUS DUPLEX Referring Phys: Teressa Senter DESAI --------------------------------------------------------------------------------  Indications: Swelling Risk Factors: None identified. Limitations: Bandages and poor ultrasound/tissue interface. Comparison Study: No prior studies. Performing Technologist: Oliver Hum RVT  Examination Guidelines: A complete evaluation includes B-mode imaging, spectral Doppler, color Doppler, and power Doppler as needed of all accessible portions of each vessel. Bilateral testing is considered an integral part of a complete examination. Limited examinations for reoccurring indications may be performed as noted.  Right Findings: +----------+------------+---------+-----------+----------+-------+ RIGHT     CompressiblePhasicitySpontaneousPropertiesSummary +----------+------------+---------+-----------+----------+-------+ IJV           Full       Yes       Yes                      +----------+------------+---------+-----------+----------+-------+ Subclavian    Full       Yes       Yes                      +----------+------------+---------+-----------+----------+-------+ Axillary      Full       Yes       Yes                      +----------+------------+---------+-----------+----------+-------+ Brachial      Full       Yes       Yes                      +----------+------------+---------+-----------+----------+-------+ Radial        Full                                          +----------+------------+---------+-----------+----------+-------+ Ulnar  Full                                          +----------+------------+---------+-----------+----------+-------+  Cephalic      Full                                          +----------+------------+---------+-----------+----------+-------+ Basilic       None                                   Acute  +----------+------------+---------+-----------+----------+-------+ Thrombus located in the basilic vein extends from the distal forearm into the proximal forearm.  Left Findings: +----------+------------+---------+-----------+----------+-------+ LEFT      CompressiblePhasicitySpontaneousPropertiesSummary +----------+------------+---------+-----------+----------+-------+ Subclavian    Full       Yes       Yes                      +----------+------------+---------+-----------+----------+-------+  Summary:  Right: No evidence of deep vein thrombosis in the upper extremity. Findings consistent with acute superficial vein thrombosis involving the right basilic vein.  Left: No evidence of thrombosis in the subclavian.  *See table(s) above for measurements and observations.  Diagnosing physician: Orlie Pollen Electronically signed by Orlie Pollen on 09/28/2022 at 1:32:25 PM.    Final    DG CHEST PORT 1 VIEW  Result Date: 09/27/2022 CLINICAL DATA:  Dyspnea. EXAM: PORTABLE CHEST 1 VIEW COMPARISON:  09/25/2022 FINDINGS: 0734 hours. The cardio pericardial silhouette is enlarged. Bibasilar collapse/consolidation with bilateral pleural effusions, similar to prior. Vascular congestion without overt airspace pulmonary edema. Telemetry leads overlie the chest. IMPRESSION: Bibasilar collapse/consolidation with bilateral pleural effusions, similar to prior. Electronically Signed   By: Misty Stanley M.D.   On: 09/27/2022 08:09   US Abdomen Limited  Result Date: 09/26/2022 CLINICAL DATA:  Elevated LFTs EXAM: ULTRASOUND ABDOMEN LIMITED RIGHT UPPER QUADRANT COMPARISON:  None Available. FINDINGS: Gallbladder: No gallstones or wall thickening visualized. No sonographic Murphy sign noted by sonographer. Common bile  duct: Diameter: 6.3 mm Liver: No focal lesion identified. Within normal limits in parenchymal echogenicity. Portal vein is patent on color Doppler imaging with normal direction of blood flow towards the liver. Other: None. IMPRESSION: 1. Normal sonographic appearance of the gallbladder. 2. Common bile duct is upper limits of normal in size. Electronically Signed   By: Yetta Glassman M.D.   On: 09/26/2022 09:20   DG CHEST PORT 1 VIEW  Result Date: 09/25/2022 CLINICAL DATA:  Dyspnea, weakness, history stroke, diabetes mellitus, hypertension EXAM: PORTABLE CHEST 1 VIEW COMPARISON:  Portable exam 0438 hours compared to 09/22/2022 FINDINGS: Enlargement of cardiac silhouette with pulmonary vascular congestion. Atherosclerotic calcification aorta. Bibasilar effusions and atelectasis. Upper lungs clear. No pneumothorax or acute osseous findings. IMPRESSION: Enlargement of cardiac silhouette with persistent bibasilar pleural effusions and atelectasis. Aortic Atherosclerosis (ICD10-I70.0). Electronically Signed   By: Lavonia Dana M.D.   On: 09/25/2022 08:14   ECHO TEE  Result Date: 09/24/2022    TRANSESOPHOGEAL ECHO REPORT   Patient Name:   [name] [name] Date of Exam: 09/24/2022 Medical Rec #:  [id number]        Height:       60.0 in Accession #:    [id number]  Weight:       158.5 lb Date of Birth:  11/19/1930       BSA:          1.691 m Patient Age:    25 years         BP:           111/68 mmHg Patient Gender: F                HR:           118 bpm. Exam Location:  Inpatient Procedure: Transesophageal Echo, Cardiac Doppler and Color Doppler Indications:     atrial fibrillation  History:         Patient has prior history of Echocardiogram examinations, most                  recent 09/17/2022. CHF; Risk Factors:Hypertension, Dyslipidemia                  and Diabetes.  Sonographer:     Johny Chess RDCS Referring Phys:  [place] Diagnosing Phys: Dixie Dials MD PROCEDURE: After discussion of the  risks and benefits of a TEE, an informed consent was obtained from the patient. The transesophogeal probe was passed without difficulty through the esophogus of the patient. Imaged were obtained with the patient in a left lateral decubitus position. Sedation performed by performing physician. Patients was under conscious sedation during this procedure., 2.5mg  of Versed. The patient developed no complications during the procedure. IMPRESSIONS  1. Left ventricular ejection fraction, by estimation, is 55 to 60%. The left ventricle has normal function. The left ventricle has no regional wall motion abnormalities. Left ventricular diastolic parameters are indeterminate.  2. Right ventricular systolic function is low normal. The right ventricular size is normal. Mildly increased right ventricular wall thickness.  3. Left atrial size was moderately dilated. No left atrial/left atrial appendage thrombus was detected.  4. Right atrial size was mildly dilated.  5. There is no evidence of cardiac tamponade.  6. The mitral valve is degenerative. Mild mitral valve regurgitation.  7. The aortic valve is tricuspid. There is mild calcification of the aortic valve. There is mild thickening of the aortic valve. Aortic valve regurgitation is mild.  8. Agitated saline contrast bubble study was negative, with no evidence of any interatrial shunt. FINDINGS  Left Ventricle: Left ventricular ejection fraction, by estimation, is 55 to 60%. The left ventricle has normal function. The left ventricle has no regional wall motion abnormalities. The left ventricular internal cavity size was normal in size. There is  borderline concentric left ventricular hypertrophy. Left ventricular diastolic parameters are indeterminate. Right Ventricle: The right ventricular size is normal. Mildly increased right ventricular wall thickness. Right ventricular systolic function is low normal. Left Atrium: Left atrial size was moderately dilated. No left  atrial/left atrial appendage thrombus was detected. Right Atrium: Right atrial size was mildly dilated. Pericardium: Trivial pericardial effusion is present. The pericardial effusion is circumferential. There is no evidence of cardiac tamponade. Mitral Valve: The mitral valve is degenerative in appearance. Mild mitral valve regurgitation. There is no evidence of mitral valve vegetation. Tricuspid Valve: The tricuspid valve is normal in structure. Tricuspid valve regurgitation is mild. There is no evidence of tricuspid valve vegetation. Aortic Valve: The aortic valve is tricuspid. There is mild calcification of the aortic valve. There is mild thickening of the aortic valve. There is mild aortic valve annular calcification. Aortic valve regurgitation is mild. There is no  evidence of aortic valve vegetation. Pulmonic Valve: The pulmonic valve was normal in structure. Pulmonic valve regurgitation is trivial. There is no evidence of pulmonic valve vegetation. Aorta: The aortic root is normal in size and structure. Venous: The left upper pulmonary vein, left lower pulmonary vein, right upper pulmonary vein and right lower pulmonary vein are normal. The inferior vena cava was not well visualized. IAS/Shunts: No atrial level shunt detected by color flow Doppler. Agitated saline contrast was given intravenously to evaluate for intracardiac shunting. Agitated saline contrast bubble study was negative, with no evidence of any interatrial shunt. Dixie Dials MD Electronically signed by Dixie Dials MD Signature Date/Time: 09/24/2022/9:54:04 AM    Final    VAS [place] LOWER EXTREMITY VENOUS (DVT)  Result Date: 09/22/2022  Lower Venous DVT Study Patient Name:  [name] [name]  Date of Exam:   09/22/2022 Medical Rec #: [id number]         Accession #:    [id number] Date of Birth: [birth year]        Patient Gender: F Patient Age:   60 years Exam Location:  [facility] Procedure:      VAS [place] LOWER EXTREMITY VENOUS (DVT)  Referring Phys: Freda Jackson --------------------------------------------------------------------------------  Indications: Edema.  Limitations: Patient condition, interruption for patient care and body habitus. Comparison Study: Prior negative bilateral LEV done 07/20/14. Prior negative left                   LEV done 10/09/16 Performing Technologist: Sharion Dove RVS  Examination Guidelines: A complete evaluation includes B-mode imaging, spectral Doppler, color Doppler, and power Doppler as needed of all accessible portions of each vessel. Bilateral testing is considered an integral part of a complete examination. Limited examinations for reoccurring indications may be performed as noted. The reflux portion of the exam is performed with the patient in reverse Trendelenburg.  +---------+---------------+---------+-----------+----------+--------------+ RIGHT    CompressibilityPhasicitySpontaneityPropertiesThrombus Aging +---------+---------------+---------+-----------+----------+--------------+ CFV      Full           Yes      No                                  +---------+---------------+---------+-----------+----------+--------------+ SFJ      Full                                                        +---------+---------------+---------+-----------+----------+--------------+ FV Prox  Full                                                        +---------+---------------+---------+-----------+----------+--------------+ FV Mid   Full                                                        +---------+---------------+---------+-----------+----------+--------------+ FV DistalFull                                                        +---------+---------------+---------+-----------+----------+--------------+  PFV      Full                                                        +---------+---------------+---------+-----------+----------+--------------+ POP      Full            Yes      No                                  +---------+---------------+---------+-----------+----------+--------------+ PTV      Full                                                        +---------+---------------+---------+-----------+----------+--------------+ PERO     Full                                                        +---------+---------------+---------+-----------+----------+--------------+   +---------+---------------+---------+-----------+----------+--------------+ LEFT     CompressibilityPhasicitySpontaneityPropertiesThrombus Aging +---------+---------------+---------+-----------+----------+--------------+ CFV      Full           Yes      Yes                                 +---------+---------------+---------+-----------+----------+--------------+ SFJ      Full                                                        +---------+---------------+---------+-----------+----------+--------------+ FV Prox  Full                                                        +---------+---------------+---------+-----------+----------+--------------+ FV Mid   Full                                                        +---------+---------------+---------+-----------+----------+--------------+ FV DistalFull                                                        +---------+---------------+---------+-----------+----------+--------------+ PFV      Full                                                        +---------+---------------+---------+-----------+----------+--------------+  POP      Full           No       Yes                                 +---------+---------------+---------+-----------+----------+--------------+ PTV      Full                                                        +---------+---------------+---------+-----------+----------+--------------+ PERO     Full                                                         +---------+---------------+---------+-----------+----------+--------------+     Summary: BILATERAL: - No evidence of deep vein thrombosis seen in the lower extremities, bilaterally. -No evidence of popliteal cyst, bilaterally.   *See table(s) above for measurements and observations. Electronically signed by Heath Lark on 09/22/2022 at 2:48:19 PM.    Final    CT HEAD WO CONTRAST ( )  Result Date: 09/22/2022 CLINICAL DATA:  Neurological deficit EXAM: CT HEAD WITHOUT CONTRAST TECHNIQUE: Contiguous axial images were obtained from the base of the skull through the vertex without intravenous contrast. RADIATION DOSE REDUCTION: This exam was performed according to the departmental dose-optimization program which includes automated exposure control, adjustment of the mA and/or kV according to patient size and/or use of iterative reconstruction technique. COMPARISON:  08/05/2014 FINDINGS: Brain: No acute findings are seen in noncontrast CT brain. There are no signs of bleeding within the cranium. Cortical sulci are prominent. There are small old lacunar infarcts in left basal ganglia. Vascular: Unremarkable. Skull: Unremarkable. Sinuses/Orbits: There is mucosal thickening in the ethmoid sinus. Other: None. IMPRESSION: No acute intracranial findings are seen in noncontrast CT brain. Atrophy. Small old lacunar infarcts are seen in basal ganglia. Mild chronic sinusitis. Electronically Signed   By: Ernie Avena M.D.   On: 09/22/2022 13:32   DG Chest Port 1V same Day  Result Date: 09/22/2022 CLINICAL DATA:  88 year old female with history of shortness of breath. EXAM: PORTABLE CHEST 1 VIEW COMPARISON:  Chest x-ray 09/20/2022. FINDINGS: Lung volumes are low. Bibasilar opacities which may reflect areas of atelectasis and/or consolidation. Moderate bilateral pleural effusions (right greater than left). No pneumothorax. Pulmonary vasculature does not appear engorged. Heart size is mildly enlarged.  Upper mediastinal contours are within normal limits. Atherosclerotic calcifications are noted within the thoracic aorta. IMPRESSION: 1. Moderate bilateral pleural effusions (right greater than left) with bibasilar areas of atelectasis and/or consolidation. 2. Aortic atherosclerosis. 3. Cardiomegaly. Electronically Signed   By: Trudie Reed M.D.   On: 09/22/2022 10:54    Labs:  Basic Metabolic Panel: Recent Labs  Lab 10/18/22 1448 10/19/22 0651  NA 135 136  K 4.2 4.4  CL 94* 94*  CO2 28 30  GLUCOSE 137* 120*  BUN 38* 39*  CREATININE 1.94* 1.33*  CALCIUM 9.3 9.4    CBC: Recent Labs  Lab 10/18/22 0604  WBC 5.5  HGB 10.7*  HCT 33.5*  MCV 88.6  PLT 206    CBG: Recent Labs  Lab 10/21/22 [phone]  GLUCAP 128*    Family history.  Mother with CVA Sister with heart problems.  Negative for colon cancer esophageal cancer or rectal cancer  Brief HPI:   [name] [name] is a 88 y.o. 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  Per chart review lives with her children.  Used a rolling walker prior to admission receiving outpatient therapies.  Presented 09/16/2022 with generalized weakness x 2 days as well as increased shortness of breath.  Chest x-ray showed increased mild to moderate right and unchanged tiny left pleural effusions.  Chemistries unremarkable except glucose 114 troponin negative.  EKG showed atrial fibrillation with RVR placed on intravenous Cardizem.  Echocardiogram with ejection fraction of 55 to 60% no wall motion abnormalities.  Patient initially scheduled for  cardioversion by cardiology services but subsequently developed acute respiratory distress requiring noninvasive imaging treated with aggressive diuresis.  Course complicated by upper GI bleed due to gastric ulcer with follow-up gastroenterology services undergoing upper GI endoscopy 09/26/2022 per Dr. Lyndel Safe and placed on PPI twice daily x 12 weeks.  Noted bouts of confusion initial restlessness CT of the head completed showing no acute findings.  Patient stabilized underwent TEE/DCCV 09/24/2022 per cardiology services maintained on amiodarone and Toprol as well as Cardizem with Lanoxin.  Venous Dopplers lower extremities 10/02/2022 showed findings consistent with age-indeterminate DVT involving the right posterior tibial vein as well as CT angiogram of the chest showing acute on chronic pulmonary embolus with bulk of the filling defect in the pulmonary arterial tree peripheral compatible with chronic embolus.  She was cleared to begin intravenous heparin plan to transition to Eliquis.  IV diuresis transitioned to Demadex 20 mg daily.  Intermittent bouts of abdominal pain with CT of the abdomen 09/30/2022 showing mild gaseous distention of small bowel in the lower abdomen up to 3.5 cm suggesting possible ileus.  Her diet had been advanced to mechanical soft.  She had some elevated LFTs felt to be possibly related to amiodarone.  Hepatitis panel negative.  Urinalysis study 10/02/2022 with negative nitrite moderate leukocytosis with preliminary cultures 80,000 gram-negative cocci and 30,000 gram-negative rod currently maintained on Omnicef transition to amoxicillin.  Therapy evaluations completed due to patient decreased functional mobility was admitted for a comprehensive rehab program.   Hospital Course: [facility]ON[facility]ELD was admitted to rehab 10/04/2022 for inpatient therapies to consist of PT, ST and OT at least three hours five days a week. Past admission physiatrist, therapy team and rehab RN have worked together  to provide customized collaborative inpatient rehab.  Pertaining to patient debility multi medical with history of left thalamic posterior limb infarction receiving inpatient rehab services.  Patient participating with therapies.  Acute on chronic pulmonary emboli DVT she had been transition from intravenous heparin to Eliquis monitoring for any bleeding episodes.  In regards to patient's GI bleed status post upper endoscopy 09/26/2022 PPI twice daily follow-up GI services.  Acute on chronic respiratory failure requiring noninvasive imaging management aggressive diuresis follow-up per cardiology services.  She exhibited no other signs of fluid overload Demadex as directed.  New onset atrial fibrillation RVR amiodarone as directed with Cardizem and Lanoxin as well as Toprol  close monitoring per cardiology services.  Elevated LFTs felt to be possibly due to amiodarone hepatitis panel negative.  History of seizure disorder longstanding Keppra no seizure activity.  Lipitor ongoing for hyperlipidemia.  Noted obesity BMI 33.20 dietary follow-up.  History of type 2 diabetes mellitus hemoglobin A1c 5.4.  UTI gram-negative rod transition from Omnicef to amoxicillin no dysuria or hematuria.  History of essential tremor longstanding primidone low-dose gabapentin Cymbalta Keppra follow-up outpatient neurology.  History of right foot fracture x-ray showed no acute changes.  Palliative care continue to follow to establish goals of care.   Blood pressures were monitored on TID basis and soft and monitored  Diabetes has been monitored with ac/hs CBG checks and SSI was use prn for tighter BS control.    Rehab course: During patient's stay in rehab weekly team conferences were held to monitor patient's progress, set goals and discuss barriers to discharge. At admission, patient required moderate assist upper body bathing lower body bathing max assist moderate assist squat pivot transfers  Physical exam.  Blood pressure  141/45 pulse 60 temperature 98.5 respirations 13 oxygen saturation is 99% room air Constitutional.  No acute distress HEENT Head.  Normocephalic and atraumatic Eyes.  Pupils round and reactive to light no discharge without nystagmus Neck.  Supple nontender no JVD without thyromegaly Cardiac regular rate and rhythm without any extra sounds or murmur heard Abdomen.  Soft nontender positive bowel sounds without rebound Respiratory effort normal no respiratory distress without wheeze Musculoskeletal Strength.  Right upper extremity 4/5 SA 4/5 ADF 5/5 EE 4/5W EE 4/5 FF 4/5 FA Left upper extremity 4/5 SA 4/5 ADF all other 5/5 Right lower extremity 4/5 HF 5/5 KE 5/5 DF EHL 5/55/5 PF Left lower extremity 4/5 HF 5/5 KE 5/5 DF 5/5 EHL 5/5 PF Neurologic.  Alert provides name and place difficult to assess due to language barrier.  Cranial nerves II through XII intact.  Severe tremor affecting neck and bilateral upper extremity.  He/She  has had improvement in activity tolerance, balance, postural control as well as ability to compensate for deficits. He/She has had improvement in functional use RUE/LUE  and RLE/LLE as well as improvement in awareness.  Sessions focused on functional mobility and transfers.  Patient transferred semireclined sitting edge of bed with head of bed elevated and use of bed rails with supervision.  Donned shoes with max assist and transferred bed to wheelchair stand pivot with rolling walker close supervision.  In the Ortho gym patient perform simulated car transfer with rolling walker close supervision and ambulated 10 feet on uneven surfaces with rolling walker contact-guard with cues.  Patient able to pick up a cup while standing with rolling walker and contact-guard.  She can increase her ambulation up to 70 feet with rest breaks as needed.  Required minimal assist for clothing management.  Full family teaching completed plan discharge to home       Disposition: Discharge to  home    Diet: Soft  Special Instructions: No driving smoking or alcohol  Medications at discharge 1.  Tylenol as needed 2.  Amiodarone 100 mg p.o. daily 3.  Eliquis 2.5 mg p.o. twice daily 4.  Lipitor 10 mg p.o. daily 5.  Cyclosporine 1 drop both eyes twice daily 6.  Voltaren gel 2 g 4 times daily as needed to affected area 7.  Lanoxin 0.0625 mg p.o. daily 8.  Cardizem 120 mg p.o. daily 9.  Neurontin 100 mg p.o. 3 times daily 10.  Keppra 500  mg p.o. twice daily 11.  Magnesium oxide 400 mg p.o. daily 12.  Toprol-XL 50 mg p.o. daily 13.  Multivitamin daily 14.  Protonix 40 mg p.o. twice daily 15.  MiraLAX twice daily hold for loose stools 16.  Demadex 10 mg p.o. daily 17.  Klor-Con 20 mEq p.o. daily 18.  Vitamin D 50,000 units every Wednesday  30-35 minutes were spent completing discharge summary and discharge planning  Discharge Instructions     Ambulatory referral to Physical Medicine Rehab   Complete by: As directed    Moderate complexity follow-up 1 to 2 weeks debility/multi medical        Follow-up Information     Raulkar, Clide Deutscher, MD Follow up.   Specialty: Physical Medicine and Rehabilitation Why: Office to call for appointment Contact information: [id number] [address] [phone]         Jackquline Denmark, MD Follow up.   Specialties: Gastroenterology, Internal Medicine Why: Call for appointment Contact information: [address]. [place] [address] [phone]         Dixie Dials, MD Follow up.   Specialty: Cardiology Why: Call for appointment Contact information: [place] [address] [phone]                 Signed: Cathlyn Parsons 10/22/2022, 5:27 AM

## 2022-10-19 NOTE — Progress Notes (Signed)
Physical Therapy Session Note  Patient Details  Name: [name] [name] MRN: [id number] Date of Birth: 03/31/1930  Today's Date: 10/19/2022 PT Individual Time: 2081-3887 PT Individual Time Calculation (min): 68 min   Short Term Goals: Week 1:  PT Short Term Goal 1 (Week 1): Patient will performed sit/stand with LRAD and MinA PT Short Term Goal 1 - Progress (Week 1): Met PT Short Term Goal 2 (Week 1): Patient will perform bed/chair transfer with LRAD and ModA x1 PT Short Term Goal 2 - Progress (Week 1): Met PT Short Term Goal 3 (Week 1): Patient will ambulate 48' with LRAD and ModA x1 PT Short Term Goal 3 - Progress (Week 1): Met Week 2:  PT Short Term Goal 1 (Week 2): STG=LTG due to LOS   Skilled Therapeutic Interventions/Progress Updates:   Pt received supine in bed and agreeable to PT. Supine>sit transfer with supervision assist and cues for improve positioning EOB to allow BLE supported on floor.   Stand pivot transfers to [facility] with RW and supervision assist. Cues for positioning prior to sitting performed x 5 throughout session.   Transported to rehab gym in [facility].   PT instruction pt in modified [place] level A balance program LAQ 2 x 10 BLE, standing HS curl 2 x 10 BLE, hip abduction in standing 2 x 10, tandem stance 10 sec, 2 bouts x 2 BLE up/down 4inch step 2 x 4 with BUE supported on parallel bars throughout all standing therex. Cues for full ROM and improved hold at end range  Gait training with RW x 59f with supervision assist with cues for erect posture. Tremor noted to intensify for the last 149fwith fatigue.   WC mobility x 7092fith supervision assist and cues for increased shoulder ROM and sitting posture sitting in WC.   Nustep BLE/BUE endurance training x 8.5 min, level 3. Cues for full ROM in BLE and consistent speed throughout entire training time.   Patient returned to room and [facility] and all needs met.        Therapy  Documentation Precautions:  Precautions Precautions: Fall Precaution Comments: Monitor oxygen Restrictions Weight Bearing Restrictions: No   Pain: denies    Therapy/Group: Individual Therapy  AusLorie Phenix/25/2023, 11:51 AM

## 2022-10-19 NOTE — Progress Notes (Signed)
PROGRESS NOTE   Subjective/Complaints:  Per notes, unable to participate in incentive spirometry this AM. No daily weights recorded - nursing to obtain. Cr improved.   ROS: Denies fevers, chills, N/V, abdominal pain, constipation, diarrhea, SOB, cough, chest pain, new weakness or paraesthesias.    Objective:   DG Chest 2 View  Result Date: 10/18/2022 CLINICAL DATA:  CHF.  Shortness of breath. EXAM: CHEST - 2 VIEW COMPARISON:  Chest radiograph 09/28/2022 and chest CTA 10/03/2022 FINDINGS: The cardiac silhouette remains enlarged. Aortic atherosclerosis is noted. Lung volumes remain low with small pleural effusions and bibasilar opacities favored to reflect atelectasis. No pneumothorax or overt pulmonary edema is evident. No acute osseous abnormality is seen. IMPRESSION: Persistent small pleural effusions and bibasilar atelectasis. Electronically Signed   By: Logan Bores M.D.   On: 10/18/2022 09:02   Recent Labs    10/18/22 0604  WBC 5.5  HGB 10.7*  HCT 33.5*  PLT 206    Recent Labs    10/18/22 1448 10/19/22 0651  NA 135 136  K 4.2 4.4  CL 94* 94*  CO2 28 30  GLUCOSE 137* 120*  BUN 38* 39*  CREATININE 1.94* 1.33*  CALCIUM 9.3 9.4     Intake/Output Summary (Last 24 hours) at 10/19/2022 0806 Last data filed at 10/18/2022 1900 Gross per 24 hour  Intake 540 ml  Output --  Net 540 ml         Physical Exam: Vital Signs Blood pressure (!) 119/59, pulse 76, temperature 98.7 F (37.1 C), temperature source Oral, resp. rate 18, height 5' (1.524 m), weight 74.6 kg, SpO2 98 %.  Constitution: Appropriate appearance for age. No apparent distress   HEENT: PERRL, EOMI grossly intact.  Resp: CTAB. No rales, rhonchi, or wheezing. Cardio: RRR. No mumurs, rubs, or gallops. No peripheral edema. Abdomen: Nondistended. Nontender. +bowel sounds. Psych: Appropriate mood and affect. Neuro: AAOx4. No apparent deficits +mild  postural tremor MSK: Moving all 4 limbs antigravity    PE from prior encounter:   General: No acute distress,  working with therapy in gym BMI 32.68 Mood and affect are appropriate, pleasant, Heart: Regular rate and rhythm,  no JVD HEENT: tremor of head, MMM Lungs: Clear to auscultation, breathing unlabored, no rales or wheezes Abdomen: Positive bowel sounds, soft nontender to palpation, nondistended Extremities: No clubbing, cyanosis, or edema Skin: No evidence of breakdown, no evidence of rash Neurologic: Cranial nerves II through XII intact, motor strength is 5/5 in left and 3/5 right  deltoid, bicep, tricep, grip, hip flexor, knee extensors, ankle dorsiflexor and plantar flexor Sensory exam hypersensitive to touch RLE, sensation intact all 4 extremities to LT  Cerebellar exam- intention tremor RUE, chronic tremor of head Musculoskeletal: Full range of motion in all 4 extremities. No joint swelling, no evidence of Right knee or ankle , swelling, effusion no erythema. Tenderness around R knee-decreased   Assessment/Plan: 1. Functional deficits which require 3+ hours per day of interdisciplinary therapy in a comprehensive inpatient rehab setting. Physiatrist is providing close team supervision and 24 hour management of active medical problems listed below. Physiatrist and rehab team continue to assess barriers to discharge/monitor patient progress toward functional and  medical goals  Care Tool:  Bathing    Body parts bathed by patient: Right arm, Left arm, Chest, Abdomen, Right upper leg, Left upper leg, Face   Body parts bathed by helper: Front perineal area, Buttocks, Right lower leg, Left lower leg     Bathing assist Assist Level: Moderate Assistance - Patient 50 - 74%     Upper Body Dressing/Undressing Upper body dressing   What is the patient wearing?: Pull over shirt    Upper body assist Assist Level: Set up assist    Lower Body Dressing/Undressing Lower body  dressing      What is the patient wearing?: Pants, Underwear/pull up     Lower body assist Assist for lower body dressing: Contact Guard/Touching assist     Toileting Toileting    Toileting assist Assist for toileting: Supervision/Verbal cueing     Transfers Chair/bed transfer  Transfers assist     Chair/bed transfer assist level: Supervision/Verbal cueing     Locomotion Ambulation   Ambulation assist   Ambulation activity did not occur: Safety/medical concerns (Unable to assess gait, stair mobility or picking up an item from the floor secondary to decreased strength, increased tremors, impaired endurance/activity tolerance and poor dynamic stability- Therapist to assess this afternoon if able.)  Assist level: Supervision/Verbal cueing Assistive device: Walker-rolling Max distance: 13ft   Walk 10 feet activity   Assist  Walk 10 feet activity did not occur: Safety/medical concerns  Assist level: Supervision/Verbal cueing Assistive device: Walker-rolling   Walk 50 feet activity   Assist Walk 50 feet with 2 turns activity did not occur: Safety/medical concerns  Assist level: Supervision/Verbal cueing Assistive device: Walker-rolling    Walk 150 feet activity   Assist Walk 150 feet activity did not occur: Safety/medical concerns (fatigue, weakness, decreased endurance)         Walk 10 feet on uneven surface  activity   Assist Walk 10 feet on uneven surfaces activity did not occur: Safety/medical concerns   Assist level: Contact Guard/Touching assist Assistive device: Walker-rolling   Wheelchair     Assist Is the patient using a wheelchair?: Yes Type of Wheelchair: Manual    Wheelchair assist level: Supervision/Verbal cueing Max wheelchair distance: 51ft    Wheelchair 50 feet with 2 turns activity    Assist        Assist Level: Total Assistance - Patient < 25%   Wheelchair 150 feet activity     Assist      Assist  Level: Total Assistance - Patient < 25%   Blood pressure (!) 119/59, pulse 76, temperature 98.7 F (37.1 C), temperature source Oral, resp. rate 18, height 5' (1.524 m), weight 74.6 kg, SpO2 98 %.  Medical Problem List and Plan: 1. Functional deficits secondary to debility/multi medical including left thalamic posterior limb infarction 2015 receiving inpatient rehab services 07/21/2014 - 07/30/2014             -patient may shower             -ELOS/Goals: 11/27 Min A  FULL CODE as per discussion with son  Discussed extension with Vicente Males and [place] as per familly's request, but patient is likely to meet supervision goals by Sunday  -Continue CIR PT/OT 2.  Acute on chronic PE: Discussed with family that it can take time for these to break up and dissolve. -DVT/anticoagulation: Venous Doppler study 10/02/2022 findings consistent with age-indeterminate DVT right posterior tibial vein as well as CT angiogram of the chest showing acute on  chronic pulmonary emboli.   Change IV heparin to low dose Eliquis as per GI - Per documentation will need serial [place] for RLE DVT on 11/13 and 11/22              -antiplatelet therapy: N/A  -Repeat U/S ordered 11/24 RLE ordered - 11/25 prelim R posterior tibial DVT, chronic  - awaiting final read   3. Pain Management: N/a, discuss with sons weaning medications if pain is well controlled. D/c Neurontin 100 mg nightly, discontinue cymbalta since not having pain, Voltaren gel as needed. Provide list of foods for pain. Continue Tylenol. 4. Mood/Behavior/Sleep: Provide emotional support             -antipsychotic agents: N/A 5. Neuropsych/cognition: This patient is capable of making decisions on her own behalf. 6. Skin/Wound Care: Routine skin checks 7. Fluids/Electrolytes/Nutrition: Routine in and outs with follow-up chemistries 8.  GI bleed.  Status post upper GI endoscopy 09/26/2022.  Continue PPI twice daily. Cleared to restart Eliquis by GI. Discussed increased risk of  bleeding.  9.  Acute respiratory failure requiring noninvasive imaging treated with aggressive diuresis.  Check oxygen saturations every shift. Wean oxygen as tolerated.  10.  Acute on chronic diastolic congestive heart failure.  Continue Demadex 20 mg daily.  Monitor for any signs of fluid overload.  Daily weights. Weight is down, continue to monitor daily              - Patient was using supplemental oxygen as needed prior to admission.   Filed Weights   10/17/22 0458 10/18/22 0439 10/19/22 0800  Weight: 75.1 kg 74.6 kg 74 kg  11/24 weight stable, Cardiology Dr. Christen Bame following, torsemide was decreased yesterday, continue to monitor 11/25: weights unchanged with torsemide 50% decrease; Cr much improved 1.9-> 1.3; monitor w/ assistance of Dr. Christen Bame  11.  New onset atrial fibrillation with RVR.  Amiodarone 200 mg daily.  Follow-up cardiology service Dr. Algie Coffer.  Status post DCCV 09/24/2022.  Continue Cardizem 30 mg every 8 hours, Lanoxin 0.0625 mg daily, Toprol 50 mg daily. Magnesium level reviewed and is normal 11/24 Dr. Christen Bame ordered EKG and CXR, advised Incentive spirometer 11/25: Unable to perform ISP this AM  12.  Elevated LFTs.  Felt to be possibly due to amiodarone.  Hepatitis panel negative follow-up labs. -11/24 recheck labs today - mildly elevated, downtrending  13.  Seizure disorder.  Longstanding Keppra 500 mg twice daily, continue 14.  Hyperlipidemia.  Lipitor 15.  Ileus.  Resolving.  Diet advanced to mechanical soft. LBM <24 hours per son.  -11/21 Increase miralax to BID -11/22 BM today, improved, continue to monitor -11/23 BM today, continue to monitor - last BM   16.  Obesity.  BMI 33.20-->32.21:  Dietary follow-up 17.  History of type 2 diabetes mellitus, latest hemoglobin A1c 5.4.  18.  UTI.  80,000 gram-positive cocci/30,000 gram-negative rod.  Initially on Omnicef transition to amoxicillin.  19. Hx essential tremor, severe. On longstanding primidone 250 mg BID,  low-dose gabapentin, cymbalta, keppra.       - Per son, can perform ADLs including self feeding/grooming at home.       - Follows closely with OP neurology - Restart gabapentin may help  20. Suboptimal vitamin D level: Level is 33, ergocalciferol 50,000U once per week for 7 weeks started. 21. Diastolic hypotension: discussed with Dr. Algie Coffer. EKG obtained and reviewed. Appreciate Dr. Algie Coffer seeing her. Amiodarone dose reduced by 50%  -11/24 she was seen by cardiology today and  yesterday  22. History of right foot fracture with current pain: will order XR- no acute changes noted 23. Knee OA. Check xray R knee and tib/fib- Knee OA.  -Continue voltaren   24. B/L feet tingling pain  -11/22 Restart Neurontin 100mg  HS  -11/23 increase Neurontin dose to 100 TID  -11/24 reports improved  LOS: 15 days A FACE TO [place] 10/19/2022, 8:06 AM

## 2022-10-20 NOTE — Consult Note (Signed)
Ref: [name], MD   Subjective:  Awake. Right calf pain. VS stable. PT working with ambulation. Son voiced issues with home care if discharged soon.He is not happy with SNF recommended by me.  Objective:  Vital Signs in the last 24 hours: Temp:  [97.6 F (36.4 C)-97.8 F (36.6 C)] 97.7 F (36.5 C) (11/26 0431) Pulse Rate:  [55-64] 55 (11/26 0431) Resp:  [16] 16 (11/26 0431) BP: (103-142)/(46-95) 103/47 (11/26 0431) SpO2:  [95 %-99 %] 95 % (11/26 0431)  Physical Exam: BP Readings from Last 1 Encounters:  10/20/22 (!) 103/47     Wt Readings from Last 1 Encounters:  10/19/22 74 kg    Weight change:  Body mass index is 31.86 kg/m. HEENT: [place]/AT, Eyes-Brown, Conjunctiva-Pale pink, Sclera-Non-icteric Neck: No JVD, No bruit, Trachea midline. Lungs:  Clear, Bilateral. Cardiac:  Regular rhythm, normal S1 and S2, no S3. II/VI systolic murmur. Abdomen:  Soft, non-tender. BS present. Extremities:  No edema present. No cyanosis. No clubbing. CNS: AxOx3, Cranial nerves grossly intact, moves all 4 extremities.  Skin: Warm and dry.   Intake/Output from previous day: 11/25 0701 - 11/26 0700 In: 840 [P.O.:840] Out: -     Lab Results: BMET    Component Value Date/Time   NA 136 10/19/2022 0651   NA 135 10/18/2022 1448   NA 134 (L) 10/07/2022 0526   NA 141 06/14/2022 1505   NA 141 04/26/2020 1708   NA 141 06/22/2018 1448   K 4.4 10/19/2022 0651   K 4.2 10/18/2022 1448   K 4.2 10/07/2022 0526   CL 94 (L) 10/19/2022 0651   CL 94 (L) 10/18/2022 1448   CL 97 (L) 10/07/2022 0526   CO2 30 10/19/2022 0651   CO2 28 10/18/2022 1448   CO2 27 10/07/2022 0526   GLUCOSE 120 (H) 10/19/2022 0651   GLUCOSE 137 (H) 10/18/2022 1448   GLUCOSE 131 (H) 10/07/2022 0526   BUN 39 (H) 10/19/2022 0651   BUN 38 (H) 10/18/2022 1448   BUN 18 10/07/2022 0526   BUN 17 06/14/2022 1505   BUN 19 04/26/2020 1708   BUN 14 06/22/2018 1448   CREATININE 1.33 (H) 10/19/2022 0651   CREATININE 1.94  (H) 10/18/2022 1448   CREATININE 0.72 10/07/2022 0526   CALCIUM 9.4 10/19/2022 0651   CALCIUM 9.3 10/18/2022 1448   CALCIUM 8.3 (L) 10/07/2022 0526   GFRNONAA 38 (L) 10/19/2022 0651   GFRNONAA 24 (L) 10/18/2022 1448   GFRNONAA >60 10/07/2022 0526   GFRAA 69 04/26/2020 1708   GFRAA >60 06/15/2019 0841   GFRAA >60 11/13/2018 1941   CBC    Component Value Date/Time   WBC 5.5 10/18/2022 0604   RBC 3.78 (L) 10/18/2022 0604   HGB 10.7 (L) 10/18/2022 0604   HGB 15.2 04/26/2020 1708   HCT 33.5 (L) 10/18/2022 0604   HCT 46.5 04/26/2020 1708   PLT 206 10/18/2022 0604   PLT 102 (L) 04/26/2020 1708   MCV 88.6 10/18/2022 0604   MCV 94 04/26/2020 1708   MCH 28.3 10/18/2022 0604   MCHC 31.9 10/18/2022 0604   RDW 14.6 10/18/2022 0604   RDW 12.8 04/26/2020 1708   LYMPHSABS 1.7 10/14/2022 0744   LYMPHSABS 1.6 04/26/2020 1708   MONOABS 0.5 10/14/2022 0744   EOSABS 0.1 10/14/2022 0744   EOSABS 0.0 04/26/2020 1708   BASOSABS 0.1 10/14/2022 0744   BASOSABS 0.0 04/26/2020 1708   HEPATIC Function Panel Recent Labs    10/04/22 0622 10/07/22 0526 10/18/22  1448  PROT 5.9* 6.2* 7.5  ALBUMIN 2.7* 2.7* 3.3*  AST 93* 64* 42*  ALT 146* 106* 56*  ALKPHOS 101 89 91   HEMOGLOBIN A1C Lab Results  Component Value Date   MPG 126 (H) 07/18/2014   CARDIAC ENZYMES Lab Results  Component Value Date   TROPONINI <0.30 03/24/2012   BNP No results for input(s): "PROBNP" in the last 8760 hours. TSH Recent Labs    09/16/22 1929  TSH 1.792   CHOLESTEROL Recent Labs    06/14/22 1505 09/17/22 0207  CHOL 180 161    Scheduled Meds:  amiodarone  100 mg Oral Daily   apixaban  2.5 mg Oral BID   atorvastatin  10 mg Oral QPM   cycloSPORINE  1 drop Both Eyes BID   digoxin  0.0625 mg Oral Daily   diltiazem  120 mg Oral Daily   gabapentin  100 mg Oral TID   lactose free nutrition  237 mL Oral TID WC   levETIRAcetam  500 mg Oral BID   magnesium oxide  400 mg Oral Daily   metoprolol succinate   50 mg Oral QPM   multivitamin with minerals  1 tablet Oral Daily   pantoprazole  40 mg Oral BID   polyethylene glycol  17 g Oral BID   potassium chloride  20 mEq Oral Daily   torsemide  10 mg Oral Daily   Vitamin D (Ergocalciferol)  50,000 Units Oral Q Wed   Continuous Infusions: PRN Meds:.acetaminophen, diclofenac Sodium, guaiFENesin, mouth rinse, senna, simethicone  Assessment/Plan: Paroxysmal atrial fibrillation Right lower leg DVT Chronic diastolic left heart failure Mild pleural effusion with bi-basilar atelectasis Recent GI bleed Anemia of blood loss HTN HLD Old stroke Arthritis Parkinson's disease  Plan: Continue medical therapy Primary care to discuss home PT/OT/Personal care etc.   LOS: 16 days   Time spent including chart review, lab review, examination, discussion with patient/Family/PT : 30 min   Dixie Dials  MD  10/20/2022, 12:22 PM

## 2022-10-20 NOTE — Progress Notes (Signed)
Occupational Therapy Session Note  Patient Details  Name: [name] [name] MRN: [id number] Date of Birth: July 01, 1930  Today's Date: 10/20/2022 OT Individual Time: 1120-1205 OT Individual Time Calculation (min): 45 min    Short Term Goals: Week 2:  OT Short Term Goal 1 (Week 2): STG = LTG due to ELOS  Skilled Therapeutic Interventions/Progress Updates:    Upon OT arrival, pt seated in w/c reporting numbness/tingling in R LE. Pt agreeable to OT treatment. Son present at bedside. Treatment intervention with a focus on self care retraining and family education. Pt's son concerned with pt being discharged tomorrow and hopes for a couple extra days in IPR. Therapist provided education on pt's current level of function and discussed that there will likely need to be adaptations that occur at home with pt's new baseline. Encouraged use of BSC nearby to conserve energy and reduce risk of falls and also educated pt and son on using one hand on walker during pants management to increase stability and reduce risk of falls. Pt and son verbalize understanding. Pt changes her clothes with Supervision and ambulates to the bathroom with RW and Supervision to toilet with Supervision. Pt requires min verbal cues to maintain one UE on walker during pants management with fair+ carryover. Pt ambulates back to her w/c with Supervision using RW and performs stand to sit transfer with CGA secondary to fatigue. Pt requires verbal cues for safe walker placement before sitting down. Hand hygiene completed with setup. Pt was left in w/c at end of session with all needs met.   Therapy Documentation Precautions:  Precautions Precautions: Fall Precaution Comments: Monitor oxygen Restrictions Weight Bearing Restrictions: No   Therapy/Group: Individual Therapy  Marvetta Gibbons 10/20/2022, 5:20 PM

## 2022-10-20 NOTE — Progress Notes (Addendum)
Physical Therapy Session Note  Patient Details  Name: [name] [name] MRN: [id number] Date of Birth: Feb 27, 1930  Today's Date: 10/20/2022 PT Individual Time: 0907-1007 PT Individual Time Calculation (min): 60 min   Short Term Goals: Week 1:  PT Short Term Goal 1 (Week 1): Patient will performed sit/stand with LRAD and MinA PT Short Term Goal 1 - Progress (Week 1): Met PT Short Term Goal 2 (Week 1): Patient will perform bed/chair transfer with LRAD and ModA x1 PT Short Term Goal 2 - Progress (Week 1): Met PT Short Term Goal 3 (Week 1): Patient will ambulate 84' with LRAD and ModA x1 PT Short Term Goal 3 - Progress (Week 1): Met Week 2:  PT Short Term Goal 1 (Week 2): STG=LTG due to LOS     Skilled Therapeutic Interventions/Progress Updates:  Pt received sitting up in wc.  She reported that her R calf "hurt a little bit", and that she had had pain meds.  Wc propulsion on straight path, using bil UEs x 50' independently.  Pt needed min assist for turns and to propel 150' as bil UE tremors increased wthi fatigue, affecting steering.    Therapeutic exercise performed with LE to increase strength for functional mobility: seated: 10 x 1 each: R/L long arc quad knee extensions, bil heel raises, bil toe raises, 20 x 1 R/L hip flexion, bil adductor squeezes.  Simulated car transfer with close supervision, stand pivot using rw.  For stand> sit, pt needed visual cue to reach back for wc for safety.    Sit> stand with close supervision to Rw.  Gait training on level tile, x 55' with 2 turns, close supervision. Pt retrieved a pen from floor while standing with RW, CG and visual cue for safest place to move hand (within Rw).  Advanced gait on mulched area, x 10' including turn, min assist for progressing RW safely through mulch. Pt stated that she had 2 steps at home.  Unclear if a ramp has been installed.  Pt willing to try 123 steps today. Up/down 12 steps 2 rails with CGA, using self selected step  through method when ascending, and step-to method when descending, leading with L foot.   Son later said that ramp has not been installed; he is unsure if his mother can reach bil rails at the same time at 2 steps in home.  PT demonstrated and discussed with him that pt should turn sideways and place 2 hands on 1 rail if she cannot reach both at the same time.  He verbalized understanding.  Son stated that pt needed to use toilet.  Gait in room with RW to toilet, close supervision.  Toilet transfer with supervision,  min assist for clothing mgt due to hospital brief.  Peri care in standing independently.Hand washing in standing with set-up.  Son reported that pt wears a pull-up diaper at home.   At conclusion of session, pt resting in wc with son present.       Therapy Documentation Precautions:  Precautions Precautions: Fall Precaution Comments: Monitor oxygen Restrictions Weight Bearing Restrictions: No       Therapy/Group: Individual Therapy  Anavey Coombes 10/20/2022, 10:23 AM

## 2022-10-20 NOTE — Progress Notes (Signed)
Palliative Medicine Progress Note   Patient Name: [name] [name]       Date: 10/20/2022 DOB: [birth year]  Age: 88 y.o. MRN#: [id number] Attending Physician: Izora Ribas, MD Primary Care Physician: Zola Button, MD Admit Date: 10/04/2022   HPI/Patient Profile: 88 y.o. female  with past medical history of seizure disorder, essential tremor, HTN, HLD, and previous CVA who initially presented to [facility] on 09/16/22 with leg weakness and shortness of breath.  She was found to have new onset atrial fibrillation with RVR.  Hospitalization was complicated by acute hypoxic respiratory failure as well as GI bleeding.  However she ultimately stabilized and discharged to acute inpatient rehab on 10/04/2022.    Subjective: Chart reviewed and patient assessed. She is OOB to the wheelchair, alert, with no acute complaints.   Son present at bedside. I let him know that I had spoken with [name] on 11/22 and that there seemed to be some confusion within the family regarding code status. I let him know that Jeneen Montgomery stated to me that patient had previously expressed her wishes for DNR status (even prior to admission on 10/23) and had completed advanced directives to reflect her wishes.   Son states he does not know if his mother has advanced directive documents; he has never seen these documents. He tells me family agreed to DNR when patient was in ICU because they felt there was "no hope".   I provided education on evidenced based poor outcomes in similar hospitalized patients, as the cause of the arrest is likely associated with chronic/terminal disease rather than a reversible acute cardio-pulmonary event. I explained that DNR/DNI does not change the medical plan and is a protective measure to keep [place] from harming  the patient in their last moments of life.    Son requests that patient remain full code at this time. He states family will discuss code status further amongst themselves after patient is discharged home. He also declines use of interpreter services so that I can discuss code status with patient directly.    Objective:  Physical Exam Vitals reviewed.  Constitutional:      General: She is not in acute distress.    Comments: Chronically ill-appearing  Pulmonary:     Effort: Pulmonary effort is normal.  Neurological:     Mental Status: She is  alert.             Vital Signs: BP (!) 103/47 (BP Location: Left Arm)   Pulse (!) 55   Temp 97.7 F (36.5 C) (Oral)   Resp 16   Ht 5' (1.524 m)   Wt 74 kg   SpO2 95%   BMI 31.86 kg/m  SpO2: SpO2: 95 % O2 Device: O2 Device: Room Air O2 Flow Rate:       Palliative Medicine Assessment & Plan   Assessment: Principal Problem:   Debility    Recommendations/Plan: Full code  Continue current interventions Goal of care is improvement and ultimately to return home Outpatient palliative referral has been made to [facility] (through [facility])  Prognosis:  Unable to determine  Discharge Planning: Home with Home Health     Thank you for allowing the Palliative Medicine Team to assist in the care of this patient.   MDM - [place]   Merry Proud, NP   Please contact Palliative Medicine Team phone at [phone] for questions and concerns.  For individual providers, please see AMION.

## 2022-10-21 ENCOUNTER — Other Ambulatory Visit (HOSPITAL_COMMUNITY): Payer: Self-pay

## 2022-10-21 LAB — GLUCOSE, CAPILLARY: Glucose-Capillary: 128 mg/dL — ABNORMAL HIGH (ref 70–99)

## 2022-10-21 MED ORDER — GABAPENTIN 100 MG PO CAPS
100.0000 mg | ORAL_CAPSULE | Freq: Every day | ORAL | Status: DC
  Administered 2022-10-21 – 2022-10-22 (×2): 100 mg via ORAL
  Filled 2022-10-21 (×2): qty 1

## 2022-10-21 MED ORDER — TORSEMIDE 10 MG PO TABS
10.0000 mg | ORAL_TABLET | Freq: Every day | ORAL | 0 refills | Status: DC
  Filled 2022-10-21: qty 30, 30d supply, fill #0

## 2022-10-21 MED ORDER — POTASSIUM CHLORIDE CRYS ER 20 MEQ PO TBCR
20.0000 meq | EXTENDED_RELEASE_TABLET | Freq: Every day | ORAL | 0 refills | Status: DC
  Filled 2022-10-21: qty 30, 30d supply, fill #0

## 2022-10-21 MED ORDER — DILTIAZEM HCL ER COATED BEADS 120 MG PO CP24
120.0000 mg | ORAL_CAPSULE | Freq: Every day | ORAL | 0 refills | Status: DC
  Filled 2022-10-21: qty 30, 30d supply, fill #0

## 2022-10-21 MED ORDER — ATORVASTATIN CALCIUM 10 MG PO TABS
10.0000 mg | ORAL_TABLET | Freq: Every evening | ORAL | 0 refills | Status: DC
  Filled 2022-10-21: qty 30, 30d supply, fill #0

## 2022-10-21 MED ORDER — METOPROLOL SUCCINATE ER 50 MG PO TB24
50.0000 mg | ORAL_TABLET | Freq: Every evening | ORAL | 0 refills | Status: DC
  Filled 2022-10-21: qty 30, 30d supply, fill #0

## 2022-10-21 MED ORDER — LEVETIRACETAM 500 MG PO TABS
500.0000 mg | ORAL_TABLET | Freq: Two times a day (BID) | ORAL | 0 refills | Status: DC
  Filled 2022-10-21: qty 60, 30d supply, fill #0

## 2022-10-21 MED ORDER — MAGNESIUM OXIDE 400 MG PO TABS
400.0000 mg | ORAL_TABLET | Freq: Every day | ORAL | 0 refills | Status: DC
  Filled 2022-10-21: qty 30, 30d supply, fill #0

## 2022-10-21 MED ORDER — PANTOPRAZOLE SODIUM 40 MG PO TBEC
40.0000 mg | DELAYED_RELEASE_TABLET | Freq: Two times a day (BID) | ORAL | 0 refills | Status: DC
  Filled 2022-10-21: qty 60, 30d supply, fill #0

## 2022-10-21 MED ORDER — DIGOXIN 62.5 MCG PO TABS
0.0625 mg | ORAL_TABLET | Freq: Every day | ORAL | 0 refills | Status: DC
  Filled 2022-10-21: qty 30, 30d supply, fill #0

## 2022-10-21 MED ORDER — APIXABAN 2.5 MG PO TABS
2.5000 mg | ORAL_TABLET | Freq: Two times a day (BID) | ORAL | 0 refills | Status: DC
  Filled 2022-10-21: qty 60, 30d supply, fill #0

## 2022-10-21 MED ORDER — POLYETHYLENE GLYCOL 3350 17 G PO PACK: 17.0000 g | PACK | Freq: Two times a day (BID) | ORAL | 0 refills | Status: AC

## 2022-10-21 MED ORDER — VITAMIN D (ERGOCALCIFEROL) 1.25 MG (50000 UNIT) PO CAPS
50000.0000 [IU] | ORAL_CAPSULE | ORAL | 0 refills | Status: DC
  Filled 2022-10-21: qty 5, 35d supply, fill #0

## 2022-10-21 MED ORDER — AMIODARONE HCL 200 MG PO TABS
100.0000 mg | ORAL_TABLET | Freq: Every day | ORAL | 0 refills | Status: DC
  Filled 2022-10-21: qty 15, 30d supply, fill #0

## 2022-10-21 MED ORDER — DICLOFENAC SODIUM 1 % EX GEL
2.0000 g | Freq: Four times a day (QID) | CUTANEOUS | 0 refills | Status: DC | PRN
  Filled 2022-10-21: qty 100, fill #0

## 2022-10-21 MED ORDER — GABAPENTIN 100 MG PO CAPS
100.0000 mg | ORAL_CAPSULE | Freq: Three times a day (TID) | ORAL | 0 refills | Status: DC
  Filled 2022-10-21: qty 90, 30d supply, fill #0

## 2022-10-21 NOTE — Progress Notes (Signed)
PROGRESS NOTE   Subjective/Complaints: Complains of right sided leg pain and it kept her up last night. She is already on gabapentin 100mg  TID, will add an additional 100mg  at night   ROS: Denies fevers, chills, N/V, abdominal pain, constipation, diarrhea, SOB, cough, chest pain, new weakness or paraesthesias. +right sided lower extremity neuropathic pain   Objective:   VAS [place] LOWER EXTREMITY VENOUS (DVT)  Result Date: 10/19/2022  Lower Venous DVT Study Patient Name:  [name] [name]  Date of Exam:   10/19/2022 Medical Rec #: [id number]         Accession #:    [id number] Date of Birth: 1930-05-09        Patient Gender: F Patient Age:   88 years Exam Location:  [facility] Procedure:      VAS [place] LOWER EXTREMITY VENOUS (DVT) Referring Phys: Fanny Dance --------------------------------------------------------------------------------  Indications: Follow up DVT. Patient in rehabilitation.  Risk Factors: Immobility DVT 10/02/22 right age indeterminate posterior tibial DVT. Comparison Study: Prior positive right LEV done 10/02/22 Performing Technologist: Sherren Kerns RVS  Examination Guidelines: A complete evaluation includes B-mode imaging, spectral Doppler, color Doppler, and power Doppler as needed of all accessible portions of each vessel. Bilateral testing is considered an integral part of a complete examination. Limited examinations for reoccurring indications may be performed as noted. The reflux portion of the exam is performed with the patient in reverse Trendelenburg.  +---------+---------------+---------+-----------+----------+--------------+ RIGHT    CompressibilityPhasicitySpontaneityPropertiesThrombus Aging +---------+---------------+---------+-----------+----------+--------------+ CFV      Full           Yes      Yes                                  +---------+---------------+---------+-----------+----------+--------------+ SFJ      Full                                                        +---------+---------------+---------+-----------+----------+--------------+ FV Prox  Full                                                        +---------+---------------+---------+-----------+----------+--------------+ FV Mid   Full                                                        +---------+---------------+---------+-----------+----------+--------------+ FV DistalFull                                                        +---------+---------------+---------+-----------+----------+--------------+  PFV      Full                                                        +---------+---------------+---------+-----------+----------+--------------+ POP      Full           Yes      Yes                                 +---------+---------------+---------+-----------+----------+--------------+ PTV      Partial                                      Chronic        +---------+---------------+---------+-----------+----------+--------------+ PERO     Full                                                        +---------+---------------+---------+-----------+----------+--------------+   +----+---------------+---------+-----------+----------+--------------+ LEFTCompressibilityPhasicitySpontaneityPropertiesThrombus Aging +----+---------------+---------+-----------+----------+--------------+ CFV Full           Yes      Yes                                 +----+---------------+---------+-----------+----------+--------------+     Summary: RIGHT: - Findings consistent with chronic deep vein thrombosis involving the right posterior tibial veins.  LEFT: - No evidence of common femoral vein obstruction.  *See table(s) above for measurements and observations. Electronically signed by Coral Else MD on 10/19/2022 at 8:51:34  PM.    Final    No results for input(s): "WBC", "HGB", "HCT", "PLT" in the last 72 hours. Recent Labs    10/18/22 1448 10/19/22 0651  NA 135 136  K 4.2 4.4  CL 94* 94*  CO2 28 30  GLUCOSE 137* 120*  BUN 38* 39*  CREATININE 1.94* 1.33*  CALCIUM 9.3 9.4     Intake/Output Summary (Last 24 hours) at 10/21/2022 1226 Last data filed at 10/21/2022 0802 Gross per 24 hour  Intake 960 ml  Output --  Net 960 ml        Physical Exam: Vital Signs Blood pressure (!) 105/55, pulse 64, temperature 97.9 F (36.6 C), temperature source Oral, resp. rate 16, height 5' (1.524 m), weight 74.2 kg, SpO2 98 %.  Constitution: Appropriate appearance for age. No apparent distress , BMI 31.95 HEENT: PERRL, EOMI grossly intact.  Resp: CTAB. No rales, rhonchi, or wheezing. Cardio: RRR. No mumurs, rubs, or gallops. No peripheral edema. Abdomen: Nondistended. Nontender. +bowel sounds. Psych: Appropriate mood and affect. Neuro: AAOx4. No apparent deficits +mild postural tremor MSK: Moving all 4 limbs antigravity    PE from prior encounter:   General: No acute distress,  working with therapy in gym BMI 32.68 Mood and affect are appropriate, pleasant, Heart: Regular rate and rhythm,  no JVD HEENT: tremor of head, MMM Lungs: Clear to auscultation, breathing unlabored, no rales or wheezes Abdomen: Positive bowel sounds, soft nontender to palpation, nondistended Extremities: No clubbing, cyanosis, or edema Skin:  No evidence of breakdown, no evidence of rash Neurologic: Cranial nerves II through XII intact, motor strength is 5/5 in left and 3/5 right  deltoid, bicep, tricep, grip, hip flexor, knee extensors, ankle dorsiflexor and plantar flexor Sensory exam hypersensitive to touch RLE, sensation intact all 4 extremities to LT  Cerebellar exam- intention tremor RUE, chronic tremor of head Musculoskeletal: Full range of motion in all 4 extremities. No joint swelling, no evidence of Right knee or ankle ,  swelling, effusion no erythema. Tenderness around R knee-decreased   Assessment/Plan: 1. Functional deficits which require 3+ hours per day of interdisciplinary therapy in a comprehensive inpatient rehab setting. Physiatrist is providing close team supervision and 24 hour management of active medical problems listed below. Physiatrist and rehab team continue to assess barriers to discharge/monitor patient progress toward functional and medical goals  Care Tool:  Bathing    Body parts bathed by patient: Right arm, Left arm, Chest, Abdomen, Right upper leg, Left upper leg, Face, Front perineal area, Right lower leg, Left lower leg, Buttocks   Body parts bathed by helper: Front perineal area, Buttocks, Right lower leg, Left lower leg     Bathing assist Assist Level: Contact Guard/Touching assist     Upper Body Dressing/Undressing Upper body dressing   What is the patient wearing?: Pull over shirt    Upper body assist Assist Level: Set up assist    Lower Body Dressing/Undressing Lower body dressing      What is the patient wearing?: Pants, Underwear/pull up     Lower body assist Assist for lower body dressing: Contact Guard/Touching assist     Toileting Toileting    Toileting assist Assist for toileting: Supervision/Verbal cueing     Transfers Chair/bed transfer  Transfers assist     Chair/bed transfer assist level: Supervision/Verbal cueing     Locomotion Ambulation   Ambulation assist   Ambulation activity did not occur: Safety/medical concerns (Unable to assess gait, stair mobility or picking up an item from the floor secondary to decreased strength, increased tremors, impaired endurance/activity tolerance and poor dynamic stability- Therapist to assess this afternoon if able.)  Assist level: Supervision/Verbal cueing Assistive device: Walker-rolling Max distance: 55   Walk 10 feet activity   Assist  Walk 10 feet activity did not occur: Safety/medical  concerns  Assist level: Supervision/Verbal cueing Assistive device: Walker-rolling   Walk 50 feet activity   Assist Walk 50 feet with 2 turns activity did not occur: Safety/medical concerns  Assist level: Supervision/Verbal cueing Assistive device: Walker-rolling    Walk 150 feet activity   Assist Walk 150 feet activity did not occur: Safety/medical concerns (fatigue)    Assistive device: Walker-rolling    Walk 10 feet on uneven surface  activity   Assist Walk 10 feet on uneven surfaces activity did not occur: Safety/medical concerns   Assist level: Minimal Assistance - Patient > 75% Assistive device: Development worker, international aid     Assist Is the patient using a wheelchair?: Yes Type of Wheelchair: Manual    Wheelchair assist level: Supervision/Verbal cueing, Minimal Assistance - Patient > 75% Max wheelchair distance: 150    Wheelchair 50 feet with 2 turns activity    Assist        Assist Level: Supervision/Verbal cueing   Wheelchair 150 feet activity     Assist      Assist Level: Minimal Assistance - Patient > 75%   Blood pressure (!) 105/55, pulse 64, temperature 97.9 F (36.6 C), temperature source Oral,  resp. rate 16, height 5' (1.524 m), weight 74.2 kg, SpO2 98 %.  Medical Problem List and Plan: 1. Functional deficits secondary to debility/multi medical including left thalamic posterior limb infarction 2015 receiving inpatient rehab services 07/21/2014 - 07/30/2014             -patient may shower             -ELOS/Goals: 11/27 Min A  FULL CODE as per discussion with son  Extend until Wednesday since equipment has not yet been delivered.   -Continue CIR PT/OT 2.  Acute on chronic PE: Discussed with family that it can take time for these to break up and dissolve. Discussed that repeat US shows chronic DVTs -DVT/anticoagulation: Venous Doppler study 10/02/2022 findings consistent with age-indeterminate DVT right posterior tibial vein as  well as CT angiogram of the chest showing acute on chronic pulmonary emboli.   Change IV heparin to low dose Eliquis as per GI             -antiplatelet therapy: N/A   3. Pain Management: N/a, discuss with sons weaning medications if pain is well controlled. D/c Neurontin 100 mg nightly, discontinue cymbalta since not having pain, Voltaren gel as needed. Provide list of foods for pain. Continue Tylenol. Add additional 100mg  Gabapentin HS 4. Insomnia: add additional 100mg  Gabapentin HS 5. Neuropsych/cognition: This patient is capable of making decisions on her own behalf. 6. Skin/Wound Care: Routine skin checks 7. Fluids/Electrolytes/Nutrition: Routine in and outs with follow-up chemistries 8.  GI bleed.  Status post upper GI endoscopy 09/26/2022.  Continue PPI twice daily. Cleared to restart Eliquis by GI. Discussed increased risk of bleeding.  9.  Acute respiratory failure requiring noninvasive imaging treated with aggressive diuresis.  Check oxygen saturations every shift. Wean oxygen as tolerated.  10.  Acute on chronic diastolic congestive heart failure.  Continue Demadex 20 mg daily.  Monitor for any signs of fluid overload.  Daily weights. Weight is down, continue to monitor daily              - Patient was using supplemental oxygen as needed prior to admission.   Filed Weights   10/18/22 0439 10/19/22 0800 10/21/22 0435  Weight: 74.6 kg 74 kg 74.2 kg  11/24 weight stable, Cardiology Dr. Christen Bame following, torsemide was decreased yesterday, continue to monitor 11/25: weights unchanged with torsemide 50% decrease; Cr much improved 1.9-> 1.3; monitor w/ assistance of Dr. Christen Bame  11.  New onset atrial fibrillation with RVR.  Amiodarone 200 mg daily.  Follow-up cardiology service Dr. Algie Coffer.  Status post DCCV 09/24/2022.  Continue Cardizem 30 mg every 8 hours, Lanoxin 0.0625 mg daily, Toprol 50 mg daily. Magnesium level reviewed and is normal 11/24 Dr. Christen Bame ordered EKG and CXR, advised  Incentive spirometer 11/25: Unable to perform ISP this AM  12.  Elevated LFTs.  Felt to be possibly due to amiodarone.  Hepatitis panel negative follow-up labs. -11/24 recheck labs today - mildly elevated, downtrending  13.  Seizure disorder.  Longstanding Keppra 500 mg twice daily, continue 14.  Hyperlipidemia.  Lipitor 15.  Ileus.  Resolving.  Diet advanced to mechanical soft. LBM <24 hours per son.  -11/21 Increase miralax to BID -11/22 BM today, improved, continue to monitor -11/23 BM today, continue to monitor - last BM   16.  Obesity.  BMI 33.20-->32.21:  Dietary follow-up 17.  History of type 2 diabetes mellitus, latest hemoglobin A1c 5.4.  18.  UTI.  80,000 gram-positive cocci/30,000 gram-negative rod.  Initially on Omnicef transition to amoxicillin.  19. Hx essential tremor, severe. On longstanding primidone 250 mg BID, low-dose gabapentin, cymbalta, keppra.       - Per son, can perform ADLs including self feeding/grooming at home.       - Follows closely with OP neurology - Restart gabapentin may help  20. Suboptimal vitamin D level: Level is 33, ergocalciferol 50,000U once per week for 7 weeks started. 21. Diastolic hypotension: discussed with Dr. Algie Coffer. EKG obtained and reviewed. Appreciate Dr. Algie Coffer seeing her. Amiodarone dose reduced by 50%  -11/24 she was seen by cardiology today and yesterday  22. History of right foot fracture with current pain: will order XR- no acute changes noted 23. Knee OA. Check xray R knee and tib/fib- Knee OA.  -Continue voltaren   24. B/L feet tingling pain  -11/22 Restart Neurontin 100mg  HS  -11/23 increase Neurontin dose to 100 TID  -11/24 reports improved  LOS: 17 days A FACE TO FACE EVALUATION WAS PERFORMED  Clint Bolder P Toula Miyasaki 10/21/2022, 12:26 PM

## 2022-10-21 NOTE — Progress Notes (Incomplete)
Inpatient Rehabilitation Discharge Medication Review by a Pharmacist  A complete drug regimen review was completed for this patient to identify any potential clinically significant medication issues.  High Risk Drug Classes Is patient taking? Indication by Medication  Antipsychotic No   Anticoagulant Yes Apixaban - DVT, PE  Antibiotic Yes   Opioid No   Antiplatelet No   Hypoglycemics/insulin No   Vasoactive Medication Yes Amiodarone, digoxin, diltiazem, Toprol XL, torsemide - Afib, CHF  Chemotherapy No   Other Yes Atorvastatin- HLD Duloxetine - mood Gabapentin - pain Keppra - seizures, tremor Pantoprazole - Reflux Potassium / magnesium - supplement Diclofenac gel- joint pain Vitamin D     Type of Medication Issue Identified Description of Issue Recommendation(s)  Drug Interaction(s) (clinically significant)     Duplicate Therapy     Allergy     No Medication Administration End Date     Incorrect Dose     Additional Drug Therapy Needed     Significant med changes from prior encounter (inform family/care partners about these prior to discharge).    Other       Clinically significant medication issues were identified that warrant physician communication and completion of prescribed/recommended actions by midnight of the next day:  No   Pharmacist comment: None  Time spent performing this drug regimen review (minutes):  20 minutes  Thank you Noah Delaine, RPh Clinical Pharmacist

## 2022-10-21 NOTE — Progress Notes (Signed)
Patient ID: [name] [name], female   DOB: [birth year], 88 y.o.   MRN: [id number]  This SW covering for primary SW, Lavera Guise.   SW received updates from attending that pt d/c date changes from 11/27 to 11/29 as family asked for an extension due to DME not arriving, and also need time to put in ramp.   1024- SW left message for pt [name] [phone]) to inform on change in d/c date, and follow-up about RW in which family will have to private pay for item as received a RW in 2021. SW encouraged follow-up and provided contact number to [employer] [facility].   SW updated [facility] on delay in discharge.   *SW received updates from Christina[facility] who reported they are out of stock of hemi w/c, so pt will receive standard, and have this w/c swapped once the hemi w/c is in stock.   Cecile Sheerer, MSW, LCSWA Office: [phone] Cell: [phone] Fax: [phone]

## 2022-10-22 MED ORDER — TORSEMIDE 20 MG PO TABS
10.0000 mg | ORAL_TABLET | ORAL | Status: DC
  Filled 2022-10-22: qty 1

## 2022-10-22 MED ORDER — TORSEMIDE 10 MG PO TABS: 10.0000 mg | ORAL_TABLET | ORAL | Status: DC

## 2022-10-22 NOTE — Progress Notes (Addendum)
Patient ID: [name] [name], female   DOB: 1930/01/14, 88 y.o.   MRN: [id number]  2:29 PM: Sw informed patient DME still unable to be delivered [employer] reports no one was home nor did patient son answer phone to receive delivery. Sw made attempt to follow up with patient son, no answer. Sw followed up with patient daughter in room and expressed that they would need to follow up with [employer] today to have DME arranged tonight/in the morning. Patient sister shares she is unable to arrange this due to not knowing when her brother will be home. Sister shares her brother will be present to the hospital at 8 PM tonight. SW informed patient daughter that staff would not be here at 8PM to provide this information. SW left contact information for [employer] with patient sister and items that need to be delivered.   SW expressed to patient daughter that if these items are unable to be delivered before patient discharge the expectation for the patient is to discharge home tomorrow. No additional questions or concerns.  3:39 PM: Sw spoke with patient son, [name] [name] and he reports he missed the delivery due to [employer] coming after 2 PM on yesterday, after he requested a delivery before 60 AM. Patient son requesting delivery today after 7:30 PM. Sw sent a message to [employer] and [employer] will reach out to logistics to potentially deliver.

## 2022-10-22 NOTE — Progress Notes (Signed)
Patient ID: [name] [name], female   DOB: [birth year], 88 y.o.   MRN: [id number]  Bedside commode ordered through [employer] per daughter request

## 2022-10-22 NOTE — Progress Notes (Signed)
PROGRESS NOTE   Subjective/Complaints: Leg pain much improved Did stairs with Tobi Bastos! Bedside commode ordered Weight up slightly   ROS: Denies fevers, chills, N/V, abdominal pain, constipation, diarrhea, SOB, cough, chest pain, new weakness or paraesthesias. +right sided lower extremity neuropathic pain- improved  Objective:   No results found. No results for input(s): "WBC", "HGB", "HCT", "PLT" in the last 72 hours. No results for input(s): "NA", "K", "CL", "CO2", "GLUCOSE", "BUN", "CREATININE", "CALCIUM" in the last 72 hours.    Intake/Output Summary (Last 24 hours) at 10/22/2022 1216 Last data filed at 10/22/2022 0851 Gross per 24 hour  Intake 720 ml  Output --  Net 720 ml        Physical Exam: Vital Signs Blood pressure (!) 124/46, pulse 62, temperature (!) 97.4 F (36.3 C), temperature source Oral, resp. rate 17, height 5' (1.524 m), weight 74.9 kg, SpO2 94 %.  General: No acute distress,  working with therapy in gym BMI 32.25 Mood and affect are appropriate, pleasant, Heart: Regular rate and rhythm,  no JVD HEENT: tremor of head, MMM Lungs: Clear to auscultation, breathing unlabored, no rales or wheezes Abdomen: Positive bowel sounds, soft nontender to palpation, nondistended Extremities: No clubbing, cyanosis, or edema Skin: No evidence of breakdown, no evidence of rash Neurologic: Cranial nerves II through XII intact, motor strength is 5/5 in left and 3/5 right  deltoid, bicep, tricep, grip, hip flexor, knee extensors, ankle dorsiflexor and plantar flexor Sensory exam hypersensitive to touch RLE, sensation intact all 4 extremities to LT  Cerebellar exam- intention tremor RUE, chronic tremor of head Musculoskeletal: Full range of motion in all 4 extremities. No joint swelling, no evidence of Right knee or ankle , swelling, effusion no erythema. Tenderness around R knee-decreased   Assessment/Plan: 1.  Functional deficits which require 3+ hours per day of interdisciplinary therapy in a comprehensive inpatient rehab setting. Physiatrist is providing close team supervision and 24 hour management of active medical problems listed below. Physiatrist and rehab team continue to assess barriers to discharge/monitor patient progress toward functional and medical goals  Care Tool:  Bathing    Body parts bathed by patient: Right arm, Left arm, Chest, Abdomen, Right upper leg, Left upper leg, Face, Front perineal area, Right lower leg, Left lower leg, Buttocks   Body parts bathed by helper: Front perineal area, Buttocks, Right lower leg, Left lower leg     Bathing assist Assist Level: Contact Guard/Touching assist     Upper Body Dressing/Undressing Upper body dressing   What is the patient wearing?: Pull over shirt    Upper body assist Assist Level: Set up assist    Lower Body Dressing/Undressing Lower body dressing      What is the patient wearing?: Pants, Underwear/pull up     Lower body assist Assist for lower body dressing: Contact Guard/Touching assist     Toileting Toileting    Toileting assist Assist for toileting: Supervision/Verbal cueing     Transfers Chair/bed transfer  Transfers assist     Chair/bed transfer assist level: Supervision/Verbal cueing     Locomotion Ambulation   Ambulation assist   Ambulation activity did not occur: Safety/medical concerns (Unable to assess  gait, stair mobility or picking up an item from the floor secondary to decreased strength, increased tremors, impaired endurance/activity tolerance and poor dynamic stability- Therapist to assess this afternoon if able.)  Assist level: Supervision/Verbal cueing Assistive device: Walker-rolling Max distance: 174ft   Walk 10 feet activity   Assist  Walk 10 feet activity did not occur: Safety/medical concerns  Assist level: Supervision/Verbal cueing Assistive device: Walker-rolling    Walk 50 feet activity   Assist Walk 50 feet with 2 turns activity did not occur: Safety/medical concerns  Assist level: Supervision/Verbal cueing Assistive device: Walker-rolling    Walk 150 feet activity   Assist Walk 150 feet activity did not occur: Safety/medical concerns (fatigue, weakness, deconditioning)    Assistive device: Walker-rolling    Walk 10 feet on uneven surface  activity   Assist Walk 10 feet on uneven surfaces activity did not occur: Safety/medical concerns   Assist level: Contact Guard/Touching assist Assistive device: Walker-rolling   Wheelchair     Assist Is the patient using a wheelchair?: Yes Type of Wheelchair: Manual    Wheelchair assist level: Supervision/Verbal cueing Max wheelchair distance: 82ft    Wheelchair 50 feet with 2 turns activity    Assist    Wheelchair 50 feet with 2 turns activity did not occur: Safety/medical concerns (fatigue, deconditioning/weakness)   Assist Level: Supervision/Verbal cueing   Wheelchair 150 feet activity     Assist  Wheelchair 150 feet activity did not occur: Safety/medical concerns (fatigue, deconditioning/weakness)   Assist Level: Minimal Assistance - Patient > 75%   Blood pressure (!) 124/46, pulse 62, temperature (!) 97.4 F (36.3 C), temperature source Oral, resp. rate 17, height 5' (1.524 m), weight 74.9 kg, SpO2 94 %.  Medical Problem List and Plan: 1. Functional deficits secondary to debility/multi medical including left thalamic posterior limb infarction 2015 receiving inpatient rehab services 07/21/2014 - 07/30/2014             -patient may shower             -ELOS/Goals: 11/27 Min A  FULL CODE as per discussion with son  Extend until Wednesday since equipment has not yet been delivered.  Bedside commode ordered  -Continue CIR PT/OT 2.  Acute on chronic PE: Discussed with family that it can take time for these to break up and dissolve. Discussed that repeat US shows  chronic DVTs. Discussed that repeat US monitoring is not indicated.  -DVT/anticoagulation: Venous Doppler study 10/02/2022 findings consistent with age-indeterminate DVT right posterior tibial vein as well as CT angiogram of the chest showing acute on chronic pulmonary emboli.   Change IV heparin to low dose Eliquis as per GI             -antiplatelet therapy: N/A   3. Pain Management: N/a, discuss with sons weaning medications if pain is well controlled, discontinue cymbalta since not having pain, Voltaren gel as needed. Provide list of foods for pain. Continue Tylenol. Continue Gabapentin 100mg /100mg /200mg . 4. Insomnia: continue 200mg  Gabapentin HS 5. Neuropsych/cognition: This patient is capable of making decisions on her own behalf. 6. Skin/Wound Care: Routine skin checks 7. Fluids/Electrolytes/Nutrition: Routine in and outs with follow-up chemistries 8.  GI bleed.  Status post upper GI endoscopy 09/26/2022.  Continue PPI twice daily. Cleared to restart Eliquis by GI. Discussed increased risk of bleeding.  9.  Acute respiratory failure requiring noninvasive imaging treated with aggressive diuresis.  Check oxygen saturations every shift. Wean oxygen as tolerated.  10.  Acute on chronic diastolic  congestive heart failure.  Continue Demadex 20 mg daily.  Monitor for any signs of fluid overload.  Daily weights. Weight is down, continue to monitor daily              - Patient was using supplemental oxygen as needed prior to admission.   Filed Weights   10/19/22 0800 10/21/22 0435 10/22/22 0500  Weight: 74 kg 74.2 kg 74.9 kg  11/24 weight stable, Cardiology Dr. Octavia Heir following, torsemide was decreased yesterday, continue to monitor 11/25: weights unchanged with torsemide 50% decrease; Cr much improved 1.9-> 1.3; monitor w/ assistance of Dr. Octavia Heir  11.  New onset atrial fibrillation with RVR.  Amiodarone 200 mg daily.  Follow-up cardiology service Dr. Doylene Canard.  Status post DCCV 09/24/2022.   Continue Cardizem 30 mg every 8 hours, Lanoxin 0.0625 mg daily, Toprol 50 mg daily. Magnesium level reviewed and is normal 11/24 Dr. Octavia Heir ordered EKG and CXR, advised Incentive spirometer 11/25: Unable to perform ISP this AM  12.  Elevated LFTs.  Felt to be possibly due to amiodarone.  Hepatitis panel negative follow-up labs. -11/24 recheck labs today - mildly elevated, downtrending  13.  Seizure disorder.  Longstanding Keppra 500 mg twice daily, continue 14.  Hyperlipidemia.  Lipitor 15.  Ileus.  Resolving.  Diet advanced to mechanical soft. LBM <24 hours per son.  -11/21 Increase miralax to BID -11/22 BM today, improved, continue to monitor -11/23 BM today, continue to monitor - last BM   16.  Obesity.  BMI 33.20-->32.21:  Dietary follow-up 17.  History of type 2 diabetes mellitus, latest hemoglobin A1c 5.4.  18.  UTI.  80,000 gram-positive cocci/30,000 gram-negative rod.  Initially on Omnicef transition to amoxicillin.  19. Hx essential tremor, severe. On longstanding primidone 250 mg BID, low-dose gabapentin, cymbalta, keppra.       - Per son, can perform ADLs including self feeding/grooming at home.       - Follows closely with OP neurology - Restart gabapentin may help  20. Suboptimal vitamin D level: Level is 33, ergocalciferol 50,000U once per week for 7 weeks started. 21. Diastolic hypotension: discussed with Dr. Doylene Canard. EKG obtained and reviewed. Appreciate Dr. Doylene Canard seeing her. Amiodarone dose reduced by 50%  -11/24 she was seen by cardiology today and yesterday  22. History of right foot fracture with current pain: will order XR- no acute changes noted 23. Knee OA. Check xray R knee and tib/fib- Knee OA.  -Continue voltaren   24. B/L feet tingling pain  -11/22 Restart Neurontin 100mg  HS  -11/23 increase Neurontin dose to 100 TID  -11/24 reports improved  LOS: 18 days A FACE TO [place] 10/22/2022, 12:16 PM

## 2022-10-22 NOTE — Consult Note (Signed)
Ref: [name], MD   Subjective:  Awake. One episode of low BP.  Mucous membranes remain dry.  Objective:  Vital Signs in the last 24 hours: Temp:  [97.4 F (36.3 C)-98 F (36.7 C)] 98 F (36.7 C) (11/28 1432) Pulse Rate:  [54-74] 74 (11/28 1734) Resp:  [16-17] 17 (11/28 1432) BP: (99-159)/(46-119) 125/58 (11/28 1734) SpO2:  [94 %-99 %] 99 % (11/28 1432) Weight:  [74.9 kg] 74.9 kg (11/28 0500)  Physical Exam: BP Readings from Last 1 Encounters:  10/22/22 (!) 125/58     Wt Readings from Last 1 Encounters:  10/22/22 74.9 kg    Weight change: 0.7 kg Body mass index is 32.25 kg/m. HEENT: [place]/AT, Eyes-Brown, Conjunctiva-Pink, Sclera-Non-icteric. Tongue dry. Neck: No JVD, No bruit, Trachea midline. Lungs:  Clear, Bilateral. Cardiac:  Regular rhythm, normal S1 and S2, no S3. II/VI systolic murmur. Abdomen:  Soft, non-tender. BS present. Extremities:  Trace ankle edema present. No cyanosis. No clubbing. CNS: AxOx3, Cranial nerves grossly intact, moves all 4 extremities.  Skin: Warm and dry.   Intake/Output from previous day: 11/27 0701 - 11/28 0700 In: 720 [P.O.:720] Out: -     Lab Results: BMET    Component Value Date/Time   NA 136 10/19/2022 0651   NA 135 10/18/2022 1448   NA 134 (L) 10/07/2022 0526   NA 141 06/14/2022 1505   NA 141 04/26/2020 1708   NA 141 06/22/2018 1448   K 4.4 10/19/2022 0651   K 4.2 10/18/2022 1448   K 4.2 10/07/2022 0526   CL 94 (L) 10/19/2022 0651   CL 94 (L) 10/18/2022 1448   CL 97 (L) 10/07/2022 0526   CO2 30 10/19/2022 0651   CO2 28 10/18/2022 1448   CO2 27 10/07/2022 0526   GLUCOSE 120 (H) 10/19/2022 0651   GLUCOSE 137 (H) 10/18/2022 1448   GLUCOSE 131 (H) 10/07/2022 0526   BUN 39 (H) 10/19/2022 0651   BUN 38 (H) 10/18/2022 1448   BUN 18 10/07/2022 0526   BUN 17 06/14/2022 1505   BUN 19 04/26/2020 1708   BUN 14 06/22/2018 1448   CREATININE 1.33 (H) 10/19/2022 0651   CREATININE 1.94 (H) 10/18/2022 1448   CREATININE 0.72  10/07/2022 0526   CALCIUM 9.4 10/19/2022 0651   CALCIUM 9.3 10/18/2022 1448   CALCIUM 8.3 (L) 10/07/2022 0526   GFRNONAA 38 (L) 10/19/2022 0651   GFRNONAA 24 (L) 10/18/2022 1448   GFRNONAA >60 10/07/2022 0526   GFRAA 69 04/26/2020 1708   GFRAA >60 06/15/2019 0841   GFRAA >60 11/13/2018 1941   CBC    Component Value Date/Time   WBC 5.5 10/18/2022 0604   RBC 3.78 (L) 10/18/2022 0604   HGB 10.7 (L) 10/18/2022 0604   HGB 15.2 04/26/2020 1708   HCT 33.5 (L) 10/18/2022 0604   HCT 46.5 04/26/2020 1708   PLT 206 10/18/2022 0604   PLT 102 (L) 04/26/2020 1708   MCV 88.6 10/18/2022 0604   MCV 94 04/26/2020 1708   MCH 28.3 10/18/2022 0604   MCHC 31.9 10/18/2022 0604   RDW 14.6 10/18/2022 0604   RDW 12.8 04/26/2020 1708   LYMPHSABS 1.7 10/14/2022 0744   LYMPHSABS 1.6 04/26/2020 1708   MONOABS 0.5 10/14/2022 0744   EOSABS 0.1 10/14/2022 0744   EOSABS 0.0 04/26/2020 1708   BASOSABS 0.1 10/14/2022 0744   BASOSABS 0.0 04/26/2020 1708   HEPATIC Function Panel Recent Labs    10/04/22 0622 10/07/22 0526 10/18/22 1448  PROT 5.9* 6.2*  7.5  ALBUMIN 2.7* 2.7* 3.3*  AST 93* 64* 42*  ALT 146* 106* 56*  ALKPHOS 101 89 91   HEMOGLOBIN A1C Lab Results  Component Value Date   MPG 126 (H) 07/18/2014   CARDIAC ENZYMES Lab Results  Component Value Date   TROPONINI <0.30 03/24/2012   BNP No results for input(s): "PROBNP" in the last 8760 hours. TSH Recent Labs    09/16/22 1929  TSH 1.792   CHOLESTEROL Recent Labs    06/14/22 1505 09/17/22 0207  CHOL 180 161    Scheduled Meds:  amiodarone  100 mg Oral Daily   apixaban  2.5 mg Oral BID   atorvastatin  10 mg Oral QPM   cycloSPORINE  1 drop Both Eyes BID   digoxin  0.0625 mg Oral Daily   diltiazem  120 mg Oral Daily   gabapentin  100 mg Oral TID   gabapentin  100 mg Oral QHS   lactose free nutrition  237 mL Oral TID WC   levETIRAcetam  500 mg Oral BID   magnesium oxide  400 mg Oral Daily   metoprolol succinate  50 mg  Oral QPM   multivitamin with minerals  1 tablet Oral Daily   pantoprazole  40 mg Oral BID   polyethylene glycol  17 g Oral BID   potassium chloride  20 mEq Oral Daily   [START ON 10/23/2022] torsemide  10 mg Oral Q M,W,F   Vitamin D (Ergocalciferol)  50,000 Units Oral Q Wed   Continuous Infusions: PRN Meds:.acetaminophen, diclofenac Sodium, guaiFENesin, mouth rinse, senna, simethicone  Assessment/Plan: Paroxysmal atrial fibrillation Right lower leg DVT Chronic diastolic left heart heart failure Mild pleural effusion with bibasilar atelectasis Recent GI bleed Anemia of blood loss HTN HLD Old stroke Arthritis Parkinson's disease  Plan: Decrease Torsemide to M-W-F. Patient reminded to increase fluid intake.     LOS: 18 days   Time spent including chart review, lab review, examination, discussion with patient : 30 min   Orpah Cobb  MD  10/22/2022, 5:36 PM

## 2022-10-22 NOTE — Progress Notes (Signed)
Occupational Therapy Session Note  Patient Details  Name: [name] [name] MRN: [id number] Date of Birth: [birth year]  Today's Date: 10/22/2022 OT Individual Time: 1105-1200 & 1435-1515 OT Individual Time Calculation (min): 55 min & 40 min   Short Term Goals: Week 2:  OT Short Term Goal 1 (Week 2): STG = LTG due to ELOS  Skilled Therapeutic Interventions/Progress Updates:  Session 1 Skilled OT intervention completed with focus on functional endurance and safety within a shower context. Pt received seated in w/c, agreeable to session. No pain reported. Pt agreeable to shower, with daughter present to participate in family education.  Completed all sit > stands using RW with supervision, then ambulatory transfers with supervision using RW throughout session.  Ambulated to shower, with supervised side step into shower with use of grab bars. Completed bathing at the overall supervision level with assist only provided for handing items and managing shower head due to bilateral tremors and incoordination. Able to stand with grab bars and supervision for balance for peri-areas. Daughter assisting with washing back and discussed shower safety strategies as well as therapist confirming that son plans to hire caregiver for assist with showers/ADLs for 3 hours a day however daughter was concerned that pt might need at least 8 hours a day for toileting needs etc. Therapist offered education that pt has been able to do supervised toileting, but does require occasional cues for safety and would benefit from items being set up for her in reach such as pull up/diaper on counter and BSC for urgency however pt's son (who pt lives with) has been not been receptive to participating in education with this OT regarding self-care needs even in a simulated environment due to preference despite repeated education regarding fluctuating needs of pt.   Supervision stand pivot to w/c using grab bars, then daughter assisted  per preference with donning gown on pt with total A, and then therapist pre-looped brief for simulation of underwear/pull ups at home, with pt able to do with supervision at the sit > stand level with several seated rest breaks needed for fatigue. Education offered that pt can do tasks with time and rest breaks, however will need the supervision in case she fluctuates and is unable to meet mod I level at this time therefore is at goal level and is appropriate for d/c from this setting to continue healing at home. Daughter was in agreement stating that pt "is at her baseline."  Pt remained seated in w/c with daughter assisting with hair drying, with all needs in reach at end of session.  Session 2 Skilled OT intervention completed with focus on d/c planning, BUE endurance. Pt received supine, agreeable to session. No pain reported however daughter indicated that pt's BP was low. Checked in with nursing who advised that pt's BP has fluctuated but to assess and determine stability before a lot of movement.   Transitioned into sitting with supervision, with BP reading of 107/55 which was higher than previous reading that was taken supine. Advised family that hydration, medication and position can alter BP.  Seated EOB pt completed the following exercises for endurance needed for ADL management: (With 4 pound dowel) -chest presses 3x10 -Overhead presses 3x10 -Bicep curls 3x10  Discussed with daughter about recommending that pt's son call [employer] to confirm his presence at the home for DME to be delivered as per CSW the son has not answered the calls for them to arrange delivery and pt's rehab stay was extended 1 day solely  for equipment reasons and cannot be done again.  With min A pt transitioned into bed, with pt remaining in L sidelying for comfort, pillows offered for positioning, bed alarm on/activated, daughter present, with all needs in reach at end of session.   Therapy  Documentation Precautions:  Precautions Precautions: Fall Precaution Comments: Monitor oxygen Restrictions Weight Bearing Restrictions: No    Therapy/Group: Individual Therapy  Blase Mess, MS, OTR/L  10/22/2022, 3:29 PM

## 2022-10-22 NOTE — Progress Notes (Addendum)
Inpatient Rehabilitation Discharge Medication Review by a Pharmacist  A complete drug regimen review was completed for this patient to identify any potential clinically significant medication issues.  High Risk Drug Classes Is patient taking? Indication by Medication  Antipsychotic No   Anticoagulant Yes Apixaban - DVT, PE  Antibiotic No   Opioid No   Antiplatelet No   Hypoglycemics/insulin No   Vasoactive Medication Yes Amiodarone, digoxin, diltiazem, Toprol XL, torsemide - Afib, CHF  Chemotherapy No   Other Yes Atorvastatin- HLD Gabapentin - pain Keppra - seizures, tremor Pantoprazole - Reflux Potassium / magnesium - supplement Diclofenac gel- joint pain Vitamin D     Type of Medication Issue Identified Description of Issue Recommendation(s)  Drug Interaction(s) (clinically significant)     Duplicate Therapy     Allergy     No Medication Administration End Date     Incorrect Dose     Additional Drug Therapy Needed     Significant med changes from prior encounter (inform family/care partners about these prior to discharge).    Other       Clinically significant medication issues were identified that warrant physician communication and completion of prescribed/recommended actions by midnight of the next day:  No   Pharmacist comment: None  Time spent performing this drug regimen review (minutes):  30  Colston Pyle BS, PharmD, BCPS Clinical Pharmacist 10/22/2022 9:03 AM  Contact: [phone] after 3 PM  "Be curious, not judgmental..." -Debbora Dus

## 2022-10-22 NOTE — Progress Notes (Signed)
Physical Therapy Session Note  Patient Details  Name: [name] [name] MRN: [id number] Date of Birth: 05/07/1930  Today's Date: 10/22/2022 PT Individual Time: 4332-9518 and 8416-6063 PT Individual Time Calculation (min): 69 min and 23 min PT Missed Time: 7 minutes PT Missed Time Reason: toileting  Short Term Goals: Week 1:  PT Short Term Goal 1 (Week 1): Patient will performed sit/stand with LRAD and MinA PT Short Term Goal 1 - Progress (Week 1): Met PT Short Term Goal 2 (Week 1): Patient will perform bed/chair transfer with LRAD and ModA x1 PT Short Term Goal 2 - Progress (Week 1): Met PT Short Term Goal 3 (Week 1): Patient will ambulate 81' with LRAD and ModA x1 PT Short Term Goal 3 - Progress (Week 1): Met Week 2:  PT Short Term Goal 1 (Week 2): STG=LTG due to LOS  Skilled Therapeutic Interventions/Progress Updates:   Treatment Session 1 Received pt sitting in [facility] with daughter present at bedside. Offered continued family education, however daughter politely declined. Pt agreeable to PT treatment and reported pain in R leg (unrated) - MD aware. Session with emphasis on functional mobility/transfers, generalized strengthening and endurance, dynamic standing balance/coordination, stair navigation, and gait training. Donned shoes with max A and pt transported to/from room in [facility] dependently for time management purposes. Pt navigated 4 6in steps with R handrail and min A using a lateral stepping technique. Pt required cues for safety and overall technique while on stairs. Pt then performed WC mobility 30f x 1 and 139fx 1 using BUE and supervision but required min A to avoid running into wall on R side due to difficulty understanding propulsion technique. In dayroom, pt stood with RW and supervision and ambulated [id number] RW and supervision - pt continues to require cues to turn all the way around and back up completley to WC [facility] to sitting. Took extended seated rest break then stood again  with RW and supervision and ambulated [id number] RW and close supervision - limited by fatigue and global weakness/deconditioning. Pt stood with RW and supervision and performed standing alternating toe taps to 4in step 3x15 reps with BUE support and close supervision. Pt then performed blocked practice sit<>stands without UE support 2x5 reps with light min A with emphasis on quad strength. Pt reported urge to void and [facility] Pt stood with RW and supervision and ambulated in/out of bathroom with RW and close supervision. Pt required min A for clothing management and able to void and perform peri-care without assist. Pt stood at sink and performed hand hygiene with close supervision - cues for visual scanning to locate soap and paper towels. Concluded session with pt sitting in WC, needs within reach, and seatbelt alarm on.   Treatment Session 2 [facility]. Pt agreeable to PT treatment and reported continued pain in RLE (unrated). Session with emphasis on functional mobility/transfers, generalized strengthening and endurance, dynamic standing balance/coordination, and toileting. [facility]. Pt performed all transfers with RW and supervision throughout session. Worked on dynamic standing balance playing cornhole without AD and CGA for balance x 3 trials tossing beanbags with RUE. Pt reported urge to use bathroom and returned to room. Pt ambulated into bathroom with RW and supervision and able to manage clothing with close supervision. Pt continent of bladder and requested time to have BM. Pt left on toilet in care of daughter  with NT planning to attend to needs. 7 minutes missed of skilled physical therapy due to toileting.    Therapy Documentation Precautions:  Precautions Precautions: Fall Precaution Comments: Monitor oxygen Restrictions Weight Bearing Restrictions:  No  Therapy/Group: Individual Therapy Alfonse Alpers PT, DPT  10/22/2022, 6:58 AM

## 2022-10-23 ENCOUNTER — Other Ambulatory Visit (HOSPITAL_COMMUNITY): Payer: Self-pay

## 2022-10-23 NOTE — Progress Notes (Addendum)
Patient ID: [name] [name], female   DOB: 1929/12/16, 88 y.o.   MRN: [id number]  SW met with patient son and daughter in room. Both have questions about medications and patient BP drops. MD and PA will follow up on questions and concerns. DME in place. Family reports no additional questions or concerns.

## 2022-10-23 NOTE — Consult Note (Signed)
Ref: Zola Button, MD   Subjective:  Awake. No chest pain. Low BP in AM without dizziness. Torsemide reduced by 50 %.  Objective:  Vital Signs in the last 24 hours: Temp:  [98 F (36.7 C)-98.2 F (36.8 C)] 98 F (36.7 C) (11/29 0356) Pulse Rate:  [55-74] 55 (11/29 0356) Resp:  [17-18] 18 (11/29 0356) BP: (99-125)/(41-58) 99/41 (11/29 0356) SpO2:  [95 %-99 %] 95 % (11/29 0356) Weight:  [76.1 kg] 76.1 kg (11/29 0500)  Physical Exam: BP Readings from Last 1 Encounters:  10/23/22 (!) 99/41     Wt Readings from Last 1 Encounters:  10/23/22 76.1 kg    Weight change: 1.2 kg Body mass index is 32.77 kg/m. HEENT: [place]/AT, Eyes-Brown, Conjunctiva-Pink, Sclera-Non-icteric Neck: No JVD, No bruit, Trachea midline. Lungs:  Clear, Bilateral. Cardiac:  Regular rhythm, normal S1 and S2, no S3. II/VI systolic murmur. Abdomen:  Soft, non-tender. BS present. Extremities:  No edema present. No cyanosis. No clubbing. Chronic tremors of hands and head bobbing. CNS: AxOx3, Cranial nerves grossly intact, moves all 4 extremities.  Skin: Warm and dry.   Intake/Output from previous day: 11/28 0701 - 11/29 0700 In: 720 [P.O.:720] Out: -     Lab Results: BMET    Component Value Date/Time   NA 136 10/19/2022 0651   NA 135 10/18/2022 1448   NA 134 (L) 10/07/2022 0526   NA 141 06/14/2022 1505   NA 141 04/26/2020 1708   NA 141 06/22/2018 1448   K 4.4 10/19/2022 0651   K 4.2 10/18/2022 1448   K 4.2 10/07/2022 0526   CL 94 (L) 10/19/2022 0651   CL 94 (L) 10/18/2022 1448   CL 97 (L) 10/07/2022 0526   CO2 30 10/19/2022 0651   CO2 28 10/18/2022 1448   CO2 27 10/07/2022 0526   GLUCOSE 120 (H) 10/19/2022 0651   GLUCOSE 137 (H) 10/18/2022 1448   GLUCOSE 131 (H) 10/07/2022 0526   BUN 39 (H) 10/19/2022 0651   BUN 38 (H) 10/18/2022 1448   BUN 18 10/07/2022 0526   BUN 17 06/14/2022 1505   BUN 19 04/26/2020 1708   BUN 14 06/22/2018 1448   CREATININE 1.33 (H) 10/19/2022 0651   CREATININE 1.94  (H) 10/18/2022 1448   CREATININE 0.72 10/07/2022 0526   CALCIUM 9.4 10/19/2022 0651   CALCIUM 9.3 10/18/2022 1448   CALCIUM 8.3 (L) 10/07/2022 0526   GFRNONAA 38 (L) 10/19/2022 0651   GFRNONAA 24 (L) 10/18/2022 1448   GFRNONAA >60 10/07/2022 0526   GFRAA 69 04/26/2020 1708   GFRAA >60 06/15/2019 0841   GFRAA >60 11/13/2018 1941   CBC    Component Value Date/Time   WBC 5.5 10/18/2022 0604   RBC 3.78 (L) 10/18/2022 0604   HGB 10.7 (L) 10/18/2022 0604   HGB 15.2 04/26/2020 1708   HCT 33.5 (L) 10/18/2022 0604   HCT 46.5 04/26/2020 1708   PLT 206 10/18/2022 0604   PLT 102 (L) 04/26/2020 1708   MCV 88.6 10/18/2022 0604   MCV 94 04/26/2020 1708   MCH 28.3 10/18/2022 0604   MCHC 31.9 10/18/2022 0604   RDW 14.6 10/18/2022 0604   RDW 12.8 04/26/2020 1708   LYMPHSABS 1.7 10/14/2022 0744   LYMPHSABS 1.6 04/26/2020 1708   MONOABS 0.5 10/14/2022 0744   EOSABS 0.1 10/14/2022 0744   EOSABS 0.0 04/26/2020 1708   BASOSABS 0.1 10/14/2022 0744   BASOSABS 0.0 04/26/2020 1708   HEPATIC Function Panel Recent Labs    10/04/22 [id number]  10/07/22 0526 10/18/22 1448  PROT 5.9* 6.2* 7.5  ALBUMIN 2.7* 2.7* 3.3*  AST 93* 64* 42*  ALT 146* 106* 56*  ALKPHOS 101 89 91   HEMOGLOBIN A1C Lab Results  Component Value Date   MPG 126 (H) 07/18/2014   CARDIAC ENZYMES Lab Results  Component Value Date   TROPONINI <0.30 03/24/2012   BNP No results for input(s): "PROBNP" in the last 8760 hours. TSH Recent Labs    09/16/22 1929  TSH 1.792   CHOLESTEROL Recent Labs    06/14/22 1505 09/17/22 0207  CHOL 180 161    Scheduled Meds:  amiodarone  100 mg Oral Daily   apixaban  2.5 mg Oral BID   atorvastatin  10 mg Oral QPM   cycloSPORINE  1 drop Both Eyes BID   digoxin  0.0625 mg Oral Daily   diltiazem  120 mg Oral Daily   gabapentin  100 mg Oral TID   gabapentin  100 mg Oral QHS   lactose free nutrition  237 mL Oral TID WC   levETIRAcetam  500 mg Oral BID   magnesium oxide  400 mg Oral  Daily   metoprolol succinate  50 mg Oral QPM   multivitamin with minerals  1 tablet Oral Daily   pantoprazole  40 mg Oral BID   polyethylene glycol  17 g Oral BID   potassium chloride  20 mEq Oral Daily   torsemide  10 mg Oral Q M,W,F   Vitamin D (Ergocalciferol)  50,000 Units Oral Q Wed   Continuous Infusions: PRN Meds:.acetaminophen, diclofenac Sodium, guaiFENesin, mouth rinse, senna, simethicone  Assessment/Plan: Paroxysmal atrial fibrillation Right lower leg DVT Chronic diastolic left heart failure Mild pleural effusion with bibasilar atelectasis Recent GI bleed Anemia of blood loss HTN HLD Old stroke Arthritis Parkinson's disease Dehydration  Plan: Increase fluid intake. F/U in 2-3 weeks post discharge.   LOS: 19 days   Time spent including chart review, lab review, examination, discussion with patient/Family : 30 min   Orpah Cobb  MD  10/23/2022, 9:04 AM

## 2022-10-23 NOTE — Progress Notes (Signed)
PROGRESS NOTE   Subjective/Complaints: Leg pain continues to be worse at night- discussed with son not using gabapentin during the day if her pain is well controlled Discussed decrease in Torsemide   ROS: Denies fevers, chills, N/V, abdominal pain, constipation, diarrhea, SOB, cough, chest pain, new weakness or paraesthesias. +right sided lower extremity neuropathic pain- improved  Objective:   No results found. No results for input(s): "WBC", "HGB", "HCT", "PLT" in the last 72 hours. No results for input(s): "NA", "K", "CL", "CO2", "GLUCOSE", "BUN", "CREATININE", "CALCIUM" in the last 72 hours.    Intake/Output Summary (Last 24 hours) at 10/23/2022 0930 Last data filed at 10/22/2022 1900 Gross per 24 hour  Intake 480 ml  Output --  Net 480 ml        Physical Exam: Vital Signs Blood pressure (!) 99/41, pulse (!) 55, temperature 98 F (36.7 C), temperature source Oral, resp. rate 18, height 5' (1.524 m), weight 76.1 kg, SpO2 95 %.  General: No acute distress,  working with therapy in gym BMI 32.25 Mood and affect are appropriate, pleasant, Heart: Bradycardia,  no JVD HEENT: tremor of head, MMM Lungs: Clear to auscultation, breathing unlabored, no rales or wheezes Abdomen: Positive bowel sounds, soft nontender to palpation, nondistended Extremities: No clubbing, cyanosis, or edema Skin: No evidence of breakdown, no evidence of rash Neurologic: Cranial nerves II through XII intact, motor strength is 5/5 in left and 3/5 right  deltoid, bicep, tricep, grip, hip flexor, knee extensors, ankle dorsiflexor and plantar flexor Sensory exam hypersensitive to touch RLE, sensation intact all 4 extremities to LT  Cerebellar exam- intention tremor RUE, chronic tremor of head Musculoskeletal: Full range of motion in all 4 extremities. No joint swelling, no evidence of Right knee or ankle , swelling, effusion no erythema. Tenderness  around R knee-decreased   Assessment/Plan: 1. Functional deficits which require 3+ hours per day of interdisciplinary therapy in a comprehensive inpatient rehab setting. Physiatrist is providing close team supervision and 24 hour management of active medical problems listed below. Physiatrist and rehab team continue to assess barriers to discharge/monitor patient progress toward functional and medical goals  Care Tool:  Bathing    Body parts bathed by patient: Right arm, Left arm, Chest, Abdomen, Right upper leg, Left upper leg, Face, Front perineal area, Right lower leg, Left lower leg, Buttocks   Body parts bathed by helper: Front perineal area, Buttocks, Right lower leg, Left lower leg     Bathing assist Assist Level: Supervision/Verbal cueing     Upper Body Dressing/Undressing Upper body dressing   What is the patient wearing?: Pull over shirt    Upper body assist Assist Level: Set up assist    Lower Body Dressing/Undressing Lower body dressing      What is the patient wearing?: Pants, Underwear/pull up     Lower body assist Assist for lower body dressing: Supervision/Verbal cueing     Toileting Toileting    Toileting assist Assist for toileting: Supervision/Verbal cueing     Transfers Chair/bed transfer  Transfers assist     Chair/bed transfer assist level: Supervision/Verbal cueing     Locomotion Ambulation   Ambulation assist   Ambulation activity  did not occur: Safety/medical concerns (Unable to assess gait, stair mobility or picking up an item from the floor secondary to decreased strength, increased tremors, impaired endurance/activity tolerance and poor dynamic stability- Therapist to assess this afternoon if able.)  Assist level: Supervision/Verbal cueing Assistive device: Walker-rolling Max distance: 130ft   Walk 10 feet activity   Assist  Walk 10 feet activity did not occur: Safety/medical concerns  Assist level: Supervision/Verbal  cueing Assistive device: Walker-rolling   Walk 50 feet activity   Assist Walk 50 feet with 2 turns activity did not occur: Safety/medical concerns  Assist level: Supervision/Verbal cueing Assistive device: Walker-rolling    Walk 150 feet activity   Assist Walk 150 feet activity did not occur: Safety/medical concerns (fatigue, weakness, deconditioning)    Assistive device: Walker-rolling    Walk 10 feet on uneven surface  activity   Assist Walk 10 feet on uneven surfaces activity did not occur: Safety/medical concerns   Assist level: Contact Guard/Touching assist Assistive device: Walker-rolling   Wheelchair     Assist Is the patient using a wheelchair?: Yes Type of Wheelchair: Manual    Wheelchair assist level: Supervision/Verbal cueing Max wheelchair distance: 40ft    Wheelchair 50 feet with 2 turns activity    Assist    Wheelchair 50 feet with 2 turns activity did not occur: Safety/medical concerns (fatigue, deconditioning/weakness)   Assist Level: Supervision/Verbal cueing   Wheelchair 150 feet activity     Assist  Wheelchair 150 feet activity did not occur: Safety/medical concerns (fatigue, deconditioning/weakness)   Assist Level: Minimal Assistance - Patient > 75%   Blood pressure (!) 99/41, pulse (!) 55, temperature 98 F (36.7 C), temperature source Oral, resp. rate 18, height 5' (1.524 m), weight 76.1 kg, SpO2 95 %.  Medical Problem List and Plan: 1. Functional deficits secondary to debility/multi medical including left thalamic posterior limb infarction 2015 receiving inpatient rehab services 07/21/2014 - 07/30/2014             -patient may shower             -ELOS/Goals: 11/27 Min A  FULL CODE as per discussion with son  D/c home today 2.  Acute on chronic PE: Discussed with family that it can take time for these to break up and dissolve. Discussed that repeat US shows chronic DVTs. Discussed that repeat US monitoring is not indicated.   -DVT/anticoagulation: Venous Doppler study 10/02/2022 findings consistent with age-indeterminate DVT right posterior tibial vein as well as CT angiogram of the chest showing acute on chronic pulmonary emboli.   Change IV heparin to low dose Eliquis as per GI             -antiplatelet therapy: N/A   3. Pain Management: N/a, discuss with sons weaning medications if pain is well controlled, discontinue cymbalta since not having pain, Voltaren gel as needed. Provide list of foods for pain. Continue Tylenol. Continue Gabapentin 100mg /100mg /200mg . Discussed that daytime dose can be skipped if she is not having pain during the day/ 4. Insomnia: continue 200mg  Gabapentin HS. Discussed that nightime dose can be increased up to 400mg  if she continues to have more pain at night 5. Neuropsych/cognition: This patient is capable of making decisions on her own behalf. 6. Skin/Wound Care: Routine skin checks 7. Fluids/Electrolytes/Nutrition: Routine in and outs with follow-up chemistries 8.  GI bleed.  Status post upper GI endoscopy 09/26/2022.  Continue PPI twice daily. Cleared to restart Eliquis by GI. Discussed increased risk of bleeding.  9.  Acute respiratory failure requiring noninvasive imaging treated with aggressive diuresis.  Check oxygen saturations every shift. Wean oxygen as tolerated.  10.  Acute on chronic diastolic congestive heart failure.  Continue Demadex 20 mg daily.  Monitor for any signs of fluid overload.  Daily weights. Weight is down, continue to monitor daily              - Patient was using supplemental oxygen as needed prior to admission.   Filed Weights   10/21/22 0435 10/22/22 0500 10/23/22 0500  Weight: 74.2 kg 74.9 kg 76.1 kg  11/24 weight stable, Cardiology Dr. Christen Bame following, torsemide was decreased yesterday, continue to monitor 11/25: weights unchanged with torsemide 50% decrease; Cr much improved 1.9-> 1.3; monitor w/ assistance of Dr. Christen Bame  11.  New onset atrial  fibrillation with RVR.  Amiodarone 200 mg daily.  Follow-up cardiology service Dr. Algie Coffer.  Status post DCCV 09/24/2022.  Continue Cardizem 30 mg every 8 hours, Lanoxin 0.0625 mg daily, Toprol 50 mg daily. Magnesium level reviewed and is normal 11/24 Dr. Christen Bame ordered EKG and CXR, advised Incentive spirometer 11/25: Unable to perform ISP this AM  12.  Elevated LFTs.  Felt to be possibly due to amiodarone.  Hepatitis panel negative follow-up labs. -11/24 recheck labs today - mildly elevated, downtrending  13.  Seizure disorder.  Longstanding Keppra 500 mg twice daily, continue 14.  Hyperlipidemia.  Lipitor 15.  Ileus.  Resolving.  Diet advanced to mechanical soft. LBM <24 hours per son.  -11/21 Increase miralax to BID -11/22 BM today, improved, continue to monitor -11/23 BM today, continue to monitor - last BM   16.  Obesity.  BMI 33.20-->32.21:  Dietary follow-up 17.  History of type 2 diabetes mellitus, latest hemoglobin A1c 5.4.  18.  UTI.  80,000 gram-positive cocci/30,000 gram-negative rod.  Initially on Omnicef transition to amoxicillin.  19. Hx essential tremor, severe. On longstanding primidone 250 mg BID, low-dose gabapentin, cymbalta, keppra.       - Per son, can perform ADLs including self feeding/grooming at home.       - Follows closely with OP neurology - Restart gabapentin may help  20. Suboptimal vitamin D level: Level is 33, ergocalciferol 50,000U once per week for 7 weeks started. 21. Diastolic hypotension: discussed with Dr. Algie Coffer. EKG obtained and reviewed. Appreciate Dr. Algie Coffer seeing her. Amiodarone dose reduced by 50%  -11/24 she was seen by cardiology today and yesterday  22. History of right foot fracture with current pain: will order XR- no acute changes noted 23. Knee OA. Check xray R knee and tib/fib- Knee OA.  -Continue voltaren   24. B/L feet tingling pain  -11/22 Restart Neurontin 100mg  HS  -11/23 increase Neurontin dose to 100 TID  -11/24 reports  improved   >30 minutes spent in discharge of patient including review of medications and follow-up appointments, physical examination, and in answering all patient's questions   LOS: 19 days A FACE TO FACE EVALUATION WAS PERFORMED  Ferrell Flam P Paden Kuras 10/23/2022, 9:30 AM

## 2022-10-24 ENCOUNTER — Telehealth: Payer: Self-pay | Admitting: *Deleted

## 2022-10-24 ENCOUNTER — Telehealth: Payer: Self-pay

## 2022-10-24 ENCOUNTER — Other Ambulatory Visit (HOSPITAL_COMMUNITY): Payer: Self-pay

## 2022-10-24 NOTE — Telephone Encounter (Signed)
Received message on clinic line that they are requesting to speak with Dr Carlis Abbott.

## 2022-10-24 NOTE — Telephone Encounter (Signed)
I approve of palliative care orders, please proceed

## 2022-10-24 NOTE — Telephone Encounter (Signed)
[name] [name] with [facility] calls nurse line requesting verbal orders for palliative care.   She reports orders were placed during hospital admit.   Will need VO from PCP to proceed.   Will forward to PCP for approval.

## 2022-10-25 ENCOUNTER — Encounter
Payer: Medicare Other | Attending: Physical Medicine and Rehabilitation | Admitting: Physical Medicine and Rehabilitation

## 2022-10-25 ENCOUNTER — Encounter: Payer: Self-pay | Admitting: Physical Medicine and Rehabilitation

## 2022-10-25 DIAGNOSIS — R1310 Dysphagia, unspecified: Secondary | ICD-10-CM | POA: Insufficient documentation

## 2022-10-25 DIAGNOSIS — Z8669 Personal history of other diseases of the nervous system and sense organs: Secondary | ICD-10-CM | POA: Insufficient documentation

## 2022-10-25 DIAGNOSIS — R5381 Other malaise: Secondary | ICD-10-CM | POA: Insufficient documentation

## 2022-10-25 NOTE — Telephone Encounter (Signed)
Returned call to Hospice. Spoke with Revonda Standard at [facility]. Provided with verbal orders.   Veronda Prude, RN

## 2022-11-05 ENCOUNTER — Telehealth: Payer: Self-pay

## 2022-11-05 NOTE — Telephone Encounter (Signed)
Shree [phone]) with [facility] callled: Home Health Aide to visit once a week for 6 weeks (skip the week of Christmas). Physical Therapy to be twice week for 2 weeks, then once a week for 5 weeks.   Hospital discharge notes reviewed. Okay given.

## 2022-11-08 ENCOUNTER — Ambulatory Visit (INDEPENDENT_AMBULATORY_CARE_PROVIDER_SITE_OTHER): Payer: Medicare Other | Admitting: Family Medicine

## 2022-11-08 ENCOUNTER — Encounter: Payer: Self-pay | Admitting: Family Medicine

## 2022-11-08 VITALS — BP 125/94 | HR 60 | Wt 144.4 lb

## 2022-11-08 DIAGNOSIS — Z8673 Personal history of transient ischemic attack (TIA), and cerebral infarction without residual deficits: Secondary | ICD-10-CM | POA: Diagnosis not present

## 2022-11-08 DIAGNOSIS — Z09 Encounter for follow-up examination after completed treatment for conditions other than malignant neoplasm: Secondary | ICD-10-CM | POA: Diagnosis present

## 2022-11-08 DIAGNOSIS — K254 Chronic or unspecified gastric ulcer with hemorrhage: Secondary | ICD-10-CM | POA: Diagnosis not present

## 2022-11-08 DIAGNOSIS — I5032 Chronic diastolic (congestive) heart failure: Secondary | ICD-10-CM | POA: Diagnosis not present

## 2022-11-08 DIAGNOSIS — L89210 Pressure ulcer of right hip, unstageable: Secondary | ICD-10-CM | POA: Diagnosis not present

## 2022-11-08 DIAGNOSIS — D508 Other iron deficiency anemias: Secondary | ICD-10-CM | POA: Diagnosis not present

## 2022-11-08 DIAGNOSIS — L03116 Cellulitis of left lower limb: Secondary | ICD-10-CM

## 2022-11-08 MED ORDER — CEPHALEXIN 250 MG PO CAPS: 250.0000 mg | ORAL_CAPSULE | Freq: Four times a day (QID) | ORAL | 0 refills | Status: AC

## 2022-11-08 MED ORDER — METOPROLOL SUCCINATE ER 50 MG PO TB24: 50.0000 mg | ORAL_TABLET | Freq: Every evening | ORAL | 0 refills | Status: DC

## 2022-11-08 MED ORDER — DILTIAZEM HCL ER COATED BEADS 120 MG PO CP24: 120.0000 mg | ORAL_CAPSULE | Freq: Every day | ORAL | 0 refills | Status: DC

## 2022-11-08 MED ORDER — PANTOPRAZOLE SODIUM 40 MG PO TBEC: 40.0000 mg | DELAYED_RELEASE_TABLET | Freq: Two times a day (BID) | ORAL | 0 refills | Status: DC

## 2022-11-08 MED ORDER — ATORVASTATIN CALCIUM 10 MG PO TABS: 10.0000 mg | ORAL_TABLET | Freq: Every evening | ORAL | 3 refills | Status: AC

## 2022-11-08 MED ORDER — DIGOXIN 62.5 MCG PO TABS: 0.0625 mg | ORAL_TABLET | Freq: Every day | ORAL | 0 refills | Status: DC

## 2022-11-08 MED ORDER — APIXABAN 2.5 MG PO TABS: 2.5000 mg | ORAL_TABLET | Freq: Two times a day (BID) | ORAL | 0 refills | Status: DC

## 2022-11-08 MED ORDER — POTASSIUM CHLORIDE CRYS ER 20 MEQ PO TBCR: 20.0000 meq | EXTENDED_RELEASE_TABLET | Freq: Every day | ORAL | 0 refills | Status: DC

## 2022-11-08 MED ORDER — AMIODARONE HCL 200 MG PO TABS: 100.0000 mg | ORAL_TABLET | Freq: Every day | ORAL | 0 refills | Status: AC

## 2022-11-08 MED ORDER — TORSEMIDE 10 MG PO TABS: 10.0000 mg | ORAL_TABLET | ORAL | 0 refills | Status: DC

## 2022-11-08 MED ORDER — MAGNESIUM OXIDE 400 MG PO TABS: 400.0000 mg | ORAL_TABLET | Freq: Every day | ORAL | 0 refills | Status: DC

## 2022-11-08 NOTE — Patient Instructions (Signed)
It was nice seeing you today!  Keep the dressing on until I see you next week. Do not remove it.  Take antibiotics as prescribed.  Stay well, [name], MD [facility] [phone]  --  Make sure to check out at the front desk before you leave today.  Please arrive at least 15 minutes prior to your scheduled appointments.  If you had blood work today, I will send you a MyChart message or a letter if results are normal. Otherwise, I will give you a call.  If you had a referral placed, they will call you to set up an appointment. Please give [place] a call if you don't hear back in the next 2 weeks.  If you need additional refills before your next appointment, please call your pharmacy first.

## 2022-11-08 NOTE — Assessment & Plan Note (Signed)
Unclear whether there was any trauma, suspect that this may be a pressure injury.  Unstageable at this point due to eschar.  She has clinical evidence of surrounding cellulitis. - hydrocolloid dressing applied, to remain on for 1 week until follow-up - cephalexin 250 mg QID x 7d

## 2022-11-08 NOTE — Assessment & Plan Note (Addendum)
No recurrent bleeding, she is on iron supplementation.  Will check CBC and ferritin.  She will continue twice daily PPI for 12 weeks total.

## 2022-11-08 NOTE — Progress Notes (Signed)
SUBJECTIVE:   CHIEF COMPLAINT / HPI:  Chief Complaint  Patient presents with   Follow-up    hospital    Patient was admitted 10/23 to 11/10 for new onset atrial fibrillation with RVR which did require BiPAP for a period of time and was in the ICU.  Underwent successful cardioversion 10/31 and was maintained in normal sinus rhythm throughout admission.  Diuretics were transitioned to torsemide 20 mg daily prior to discharge.  She did have melena during admission and required 1 unit of packed red blood cell transfusion.  She underwent EGD which demonstrated nonbleeding gastric ulcer.  She had no further bleeding and hemoglobin remained stable.  Placed on PPI twice daily for 12 weeks.  Discharged on iron supplementation.  She was also noted to have superficial thrombosis on the right basilar vein which was conservatively managed.  She also had UTI during admission growing E. faecalis and E. coli for which she was treated with antibiotics.  She was discharged to acute inpatient rehab for which she was there for about 3 weeks, discharged on 11/29.  At discharge, new medications include amiodarone 100 mg daily, apixaban 2.5 mg twice daily, digoxin, diltiazem 120 mg daily, metoprolol succinate 50 mg daily, pantoprazole 40 mg twice daily, and torsemide 10 mg Monday Wednesday Friday.  Issues for follow up 1. Neuro follow up outpatient for essential tremor 2. Abnormal endometrial distension and or fluid at 13 mm - no further evaluation per family 3. Reassess CBC and CMP 4. Consider repeat DVT US of RLE on 11/13 and on 11/22 to ensure no progression of DVT if anticoagulation is discontinued 5. Amlodipine held at discharge, please reassess and adjust as appropriate 6. Consider weaning diltiazem and increasing beta blocker to aid in tremor given she is on digoxin and is predisposed to interactions 7. Follow up iron studies and need for more iron supplementation after GI bleed 8. Started on Torsemide per  Cards. Discontinued Lasix, should follow up with Cards on decision on whether to start Lasix.   She is accompanied by her son today.  Primary concern today is a wound to her right thigh that she has had for about 10 days, just a few days after she left the hospital.  She is having significant burning pain to the area.  Son thinks she may have hit the leg on a stool but is uncertain.  Denies any fever, chills.  She has not had any blood in her stool or dark stool since hospital discharge.  Denies chest pain, shortness of breath.  Son reports that she has cardiology follow-up next Wednesday.  Patient requires multiple medication refills.  PERTINENT  PMH / PSH: Atrial fibrillation s/p DCCV on apixaban, HFpEF, DVT,  Patient Care Team: Zola Button, MD as PCP - General (Family Medicine)   OBJECTIVE:   BP (!) 125/94   Pulse 60   Wt 144 lb 6 oz (65.5 kg)   SpO2 98%   BMI 28.20 kg/m   Physical Exam Constitutional:      General: She is not in acute distress.    Comments: Seated in wheelchair  HENT:     Head: Normocephalic and atraumatic.  Cardiovascular:     Rate and Rhythm: Normal rate and regular rhythm.  Pulmonary:     Effort: Pulmonary effort is normal. No respiratory distress.     Comments: Faint bibasilar rales Musculoskeletal:     Cervical back: Neck supple.     Right lower leg: No edema.  Left lower leg: No edema.     Comments: There is significant swelling of the right thigh.  Skin:    General: Skin is dry.     Findings: Erythema present.     Comments: Right inferior medial thigh with ulcerated wound with eschar and surrounding erythema which is warm to the touch and tender to palpation.  Neurological:     Mental Status: She is alert.     Comments: Obvious tremor affecting head and lower extremities         {Show previous vital signs (optional):23777}    ASSESSMENT/PLAN:   Hospital discharge follow-up   Gastric ulcer No recurrent bleeding, she is on iron  supplementation.  Will check CBC and ferritin.  She will continue twice daily PPI for 12 weeks total.  CHF (congestive heart failure), NYHA class III, chronic, diastolic (HCC) Appears to be euvolemic.  Medications refilled, advised patient to have cardiologist handle further refills of cardiac medications such as the amiodarone and digoxin.  - cardiology follow-up scheduled next week - check CMP, Mg  Pressure injury of right thigh, unstageable (HCC) Unclear whether there was any trauma, suspect that this may be a pressure injury.  Unstageable at this point due to eschar.  She has clinical evidence of surrounding cellulitis. - hydrocolloid dressing applied, to remain on for 1 week until follow-up - cephalexin 250 mg QID x 7d    Return in 1 week (on 11/15/2022) for f/u wound.   [name], MD [facility]

## 2022-11-08 NOTE — Assessment & Plan Note (Addendum)
Appears to be euvolemic.  Medications refilled, advised patient to have cardiologist handle further refills of cardiac medications such as the amiodarone and digoxin.  - cardiology follow-up scheduled next week - check CMP, Mg

## 2022-11-09 ENCOUNTER — Other Ambulatory Visit: Payer: Self-pay | Admitting: Family Medicine

## 2022-11-11 ENCOUNTER — Other Ambulatory Visit: Payer: Medicare Other

## 2022-11-11 NOTE — Addendum Note (Signed)
Addended by: Georges Lynch T on: 11/11/2022 10:46 AM   Modules accepted: Orders

## 2022-11-12 ENCOUNTER — Encounter: Payer: Self-pay | Admitting: Family Medicine

## 2022-11-12 LAB — CBC
Hematocrit: 33.2 % — ABNORMAL LOW (ref 34.0–46.6)
Hemoglobin: 10.5 g/dL — ABNORMAL LOW (ref 11.1–15.9)
MCH: 26.1 pg — ABNORMAL LOW (ref 26.6–33.0)
MCHC: 31.6 g/dL (ref 31.5–35.7)
MCV: 83 fL (ref 79–97)
Platelets: 211 10*3/uL (ref 150–450)
RBC: 4.02 x10E6/uL (ref 3.77–5.28)
RDW: 14.6 % (ref 11.7–15.4)
WBC: 7.3 10*3/uL (ref 3.4–10.8)

## 2022-11-12 LAB — COMPREHENSIVE METABOLIC PANEL
ALT: 20 IU/L (ref 0–32)
AST: 23 IU/L (ref 0–40)
Albumin/Globulin Ratio: 1.4 (ref 1.2–2.2)
Albumin: 3.7 g/dL (ref 3.7–4.7)
Alkaline Phosphatase: 91 IU/L (ref 44–121)
BUN/Creatinine Ratio: 18 (ref 12–28)
BUN: 15 mg/dL (ref 8–27)
Bilirubin Total: 0.2 mg/dL (ref 0.0–1.2)
CO2: 24 mmol/L (ref 20–29)
Calcium: 9 mg/dL (ref 8.7–10.3)
Chloride: 102 mmol/L (ref 96–106)
Creatinine, Ser: 0.83 mg/dL (ref 0.57–1.00)
Globulin, Total: 2.7 g/dL (ref 1.5–4.5)
Glucose: 125 mg/dL — ABNORMAL HIGH (ref 70–99)
Potassium: 4.3 mmol/L (ref 3.5–5.2)
Sodium: 140 mmol/L (ref 134–144)
Total Protein: 6.4 g/dL (ref 6.0–8.5)
eGFR: 67 mL/min/{1.73_m2} (ref >59)

## 2022-11-12 LAB — MAGNESIUM: Magnesium: 1.9 mg/dL (ref 1.6–2.3)

## 2022-11-12 LAB — FERRITIN: Ferritin: 20 ng/mL (ref 15–150)

## 2022-11-15 ENCOUNTER — Ambulatory Visit (INDEPENDENT_AMBULATORY_CARE_PROVIDER_SITE_OTHER): Payer: Medicare Other | Admitting: Family Medicine

## 2022-11-15 ENCOUNTER — Telehealth: Payer: Self-pay

## 2022-11-15 ENCOUNTER — Encounter: Payer: Self-pay | Admitting: Family Medicine

## 2022-11-15 VITALS — BP 117/52 | HR 73 | Wt 142.4 lb

## 2022-11-15 DIAGNOSIS — L89210 Pressure ulcer of right hip, unstageable: Secondary | ICD-10-CM

## 2022-11-15 NOTE — Telephone Encounter (Signed)
Received the following message from PCP.   [name], MD  P [facility] Rn Team Patient will keep hydrocolloid dressing on for 1 week until scheduled follow-up.  She will not require any dressing changes for the time being.  Called and provided update to [facility] RN.   Veronda Prude, RN

## 2022-11-15 NOTE — Assessment & Plan Note (Signed)
Wound appears to be healing well with hydrocolloid dressing.  Cellulitis has improved significantly with oral antibiotics. - hydrocolloid dressing replaced, leave on for 1 week until follow-up - finish remainder of antibiotics

## 2022-11-15 NOTE — Progress Notes (Signed)
    SUBJECTIVE:   CHIEF COMPLAINT / HPI:  Chief Complaint  Patient presents with   Follow-up    I saw patient last week for hospital follow-up she did have a wound to her right thigh with surrounding cellulitis which we treated with antibiotics and a hydrocolloid dressing.  She is here today for follow-up.  She is here with the son today.  She reports that the pain in the right thigh wound has improved since last visit.  She has kept the hydrocolloid dressing on since last visit.  She is still having some sharp pain throughout her entire right foot.  She does take gabapentin.  PERTINENT  PMH / PSH: Atrial fibrillation s/p DCCV on apixaban, HFpEF, DVT  Patient Care Team: [name], MD as PCP - General (Family Medicine)   OBJECTIVE:   BP (!) 117/52   Pulse 73   Wt 142 lb 6 oz (64.6 kg)   SpO2 97%   BMI 27.81 kg/m   Physical Exam Constitutional:      General: She is not in acute distress.    Comments: Seated in wheelchair  Cardiovascular:     Rate and Rhythm: Normal rate and regular rhythm.  Pulmonary:     Effort: Pulmonary effort is normal. No respiratory distress.     Breath sounds: Rales present.     Comments: Faint bibasilar rales Musculoskeletal:     Cervical back: Neck supple.     Right lower leg: No edema.     Left lower leg: No edema.  Skin:    General: Skin is warm and dry.     Comments: Right distal medial thigh with irregular shaped wound measures approximately 3 cm x 1 cm.  There is minimal surrounding erythema, significantly less compared to prior visit.  There is granulation tissue in the wound.  Neurological:     Mental Status: She is alert.           {Show previous vital signs (optional):23777}    ASSESSMENT/PLAN:   Pressure injury of right thigh, unstageable (HCC) Wound appears to be healing well with hydrocolloid dressing.  Cellulitis has improved significantly with oral antibiotics. - hydrocolloid dressing replaced, leave on for 1 week  until follow-up - finish remainder of antibiotics    Return in 1 week (on 11/22/2022) for Follow-up wound.   [name], MD [facility]

## 2022-11-15 NOTE — Patient Instructions (Addendum)
It was nice seeing you today!  Keep the dressing on until I see you next week. It is healing well.  Stay well, [name], MD [facility] [phone]  --  Make sure to check out at the front desk before you leave today.  Please arrive at least 15 minutes prior to your scheduled appointments.  If you had blood work today, I will send you a MyChart message or a letter if results are normal. Otherwise, I will give you a call.  If you had a referral placed, they will call you to set up an appointment. Please give [place] a call if you don't hear back in the next 2 weeks.  If you need additional refills before your next appointment, please call your pharmacy first.

## 2022-11-15 NOTE — Telephone Encounter (Signed)
April, RN with [facility] calls nurse line regarding wound care orders for right thigh wound. Patient has an appointment for follow up this afternoon with Dr. Wynelle Link.   She is requesting returned call with verbal orders for directions on wound care.   Will forward to PCP. Please route back to RN team to return call to nurse with verbal orders after patient has been evaluated.   Veronda Prude, RN

## 2022-11-21 ENCOUNTER — Telehealth: Payer: Self-pay

## 2022-11-22 ENCOUNTER — Ambulatory Visit (INDEPENDENT_AMBULATORY_CARE_PROVIDER_SITE_OTHER): Payer: Medicare Other | Admitting: Family Medicine

## 2022-11-22 ENCOUNTER — Encounter: Payer: Self-pay | Admitting: Physical Medicine and Rehabilitation

## 2022-11-22 ENCOUNTER — Encounter (HOSPITAL_BASED_OUTPATIENT_CLINIC_OR_DEPARTMENT_OTHER): Payer: Medicare Other | Admitting: Physical Medicine and Rehabilitation

## 2022-11-22 ENCOUNTER — Telehealth (HOSPITAL_COMMUNITY): Payer: Self-pay

## 2022-11-22 ENCOUNTER — Encounter: Payer: Self-pay | Admitting: Family Medicine

## 2022-11-22 VITALS — BP 114/56 | HR 66

## 2022-11-22 VITALS — BP 119/70 | HR 66 | Ht 60.0 in

## 2022-11-22 DIAGNOSIS — R5381 Other malaise: Secondary | ICD-10-CM | POA: Diagnosis present

## 2022-11-22 DIAGNOSIS — Z23 Encounter for immunization: Secondary | ICD-10-CM | POA: Diagnosis not present

## 2022-11-22 DIAGNOSIS — Z8669 Personal history of other diseases of the nervous system and sense organs: Secondary | ICD-10-CM | POA: Diagnosis present

## 2022-11-22 DIAGNOSIS — L89210 Pressure ulcer of right hip, unstageable: Secondary | ICD-10-CM

## 2022-11-22 DIAGNOSIS — R1310 Dysphagia, unspecified: Secondary | ICD-10-CM | POA: Diagnosis present

## 2022-11-22 DIAGNOSIS — N859 Noninflammatory disorder of uterus, unspecified: Secondary | ICD-10-CM | POA: Diagnosis not present

## 2022-11-22 NOTE — Assessment & Plan Note (Signed)
Healing well with hydrocolloid dressing, wound is quite moist so we will transition to nonadherent dressing. - daily dressing changes with nonadherent pad, apply Vaseline every other day - wound dressed in office today, provided a few extra nonadherent pads

## 2022-11-22 NOTE — Telephone Encounter (Signed)
Attempted to contact patient to schedule Modified Barium Swallow - left voicemail. 

## 2022-11-22 NOTE — Progress Notes (Signed)
Subjective:    Patient ID: [name] [name], female    DOB: 02-02-1930, 88 y.o.   MRN: [id number]  HPI [name] is a 88 year old woman who presents for hospital follow-up after CIR admission for debility  1) Debility -she is now able to walk for about 2 minutes -she has followed up with cardiology and has her PCP follow-up today -she does not need any refills -she is receiving home therapy.   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 General activity 1 Relation with others 1 Enjoyment of life 1 What TIME of day is your pain at its worst? varies Sleep (in general) Fair  Pain is worse with: unsure Pain improves with:  . Relief from Meds: 3  Family History  Problem Relation Age of Onset   Stroke Mother    Heart Problems Sister    Heart Problems Brother    Coronary artery disease Other    Tremor Neg Hx    Social History   Socioeconomic History   Marital status: Widowed    Spouse name: Not on file   Number of children: 5   Years of education: 9   Highest education level: Not on file  Occupational History    Comment: does not work  Tobacco Use   Smoking status: Never   Smokeless tobacco: Never  Vaping Use   Vaping Use: Never used  Substance and Sexual Activity   Alcohol use: No    Alcohol/week: 0.0 standard drinks of alcohol   Drug use: No   Sexual activity: Never  Other Topics Concern   Not on file  Social History Narrative   Patient is right handed and resides with son   Social Determinants of Health   Financial Resource Strain: Not on file  Food Insecurity: No Food Insecurity (09/23/2022)   Hunger Vital Sign    Worried About Running Out of Food in the Last Year: Never true    Ran Out of Food in the Last Year: Never true  Transportation Needs: No  Transportation Needs (09/23/2022)   PRAPARE - Administrator, Civil Service (Medical): No    Lack of Transportation (Non-Medical): No  Physical Activity: Not on file  Stress: Not on file  Social Connections: Not on file   Past Surgical History:  Procedure Laterality Date   BIOPSY  09/26/2022   Procedure: BIOPSY;  Surgeon: Lynann Bologna, MD;  Location: [facility] ENDOSCOPY;  Service: Gastroenterology;;   CATARACT EXTRACTION Bilateral    ESOPHAGOGASTRODUODENOSCOPY (EGD) WITH PROPOFOL N/A 09/26/2022   Procedure: ESOPHAGOGASTRODUODENOSCOPY (EGD) WITH PROPOFOL;  Surgeon: Lynann Bologna, MD;  Location: [facility] ENDOSCOPY;  Service: Gastroenterology;  Laterality: N/A;   FOOT SURGERY     IR KYPHO LUMBAR INC FX REDUCE BONE BX UNI/BIL CANNULATION INC/IMAGING  06/15/2019   Past Surgical History:  Procedure Laterality Date   BIOPSY  09/26/2022   Procedure: BIOPSY;  Surgeon: Lynann Bologna, MD;  Location: [facility] ENDOSCOPY;  Service: Gastroenterology;;   CATARACT EXTRACTION Bilateral    ESOPHAGOGASTRODUODENOSCOPY (EGD) WITH PROPOFOL N/A 09/26/2022   Procedure: ESOPHAGOGASTRODUODENOSCOPY (EGD) WITH PROPOFOL;  Surgeon: Lynann Bologna, MD;  Location: [facility] ENDOSCOPY;  Service: Gastroenterology;  Laterality: N/A;   FOOT SURGERY     IR KYPHO LUMBAR INC FX REDUCE BONE BX UNI/BIL CANNULATION INC/IMAGING  06/15/2019   Past Medical History:  Diagnosis Date   Anxiety    Diabetes ([place]) 10/27/2013   DVT (deep venous thrombosis) (HCC)    Essential tremor 10/27/2013   High cholesterol 1999   Hypertension    Ischemic stroke ([place]) 07/18/2014   Obesity, unspecified 10/27/2013   Pneumonia    Unspecified hereditary and idiopathic peripheral neuropathy 10/27/2013   BP 119/70   Pulse 66   Ht 5' (1.524 m)   SpO2 96%   BMI 27.81 kg/m   Opioid Risk Score:   Fall Risk Score:  `1  Depression screen [facility] 2/9     11/22/2022   11:51 AM 11/15/2022    1:35 PM 11/08/2022    2:28 PM 07/26/2022    1:52 PM 06/14/2022   11:37 AM  06/08/2021    6:23 PM 12/07/2020    2:42 PM  Depression screen PHQ 2/9  Decreased Interest 1 1 1 1  0 0 1  Down, Depressed, Hopeless 0 1 1 2  0 0 0  PHQ - 2 Score 1 2 2 3  0 0 1  Altered sleeping 3 1 3 1 1  0 0  Tired, decreased energy 3 3 2 1 3 3  0  Change in appetite 1 1 2 3  0 0 0  Feeling bad or failure about yourself  1 1 3  0 0 0 0  Trouble concentrating 1 1 1  0 0 0 0  Moving slowly or fidgety/restless 1 1 2  0 0 0 0  Suicidal thoughts 1 0 0 0 0 0 0  PHQ-9 Score 12 10 15 8 4 3 1   Difficult doing work/chores Somewhat difficult    Extremely dIfficult Very difficult Extremely dIfficult      Review of Systems  All other systems reviewed and are negative.     Objective:   Physical Exam Gen: no distress, normal appearing HEENT: oral mucosa pink and moist, NCAT Cardio: Reg rate Chest: normal effort, normal rate of breathing Abd: soft, non-distended Ext: no edema Psych: pleasant, normal affect Skin: intact Neuro: Alert and oriented x3 Musculoskeletal: 4/5 strength throughout     Assessment & Plan:   1) Cardiac debility -continue home therapy -recommended 10 sit to stands per hour -continue follow-up with cardiology -recommended cardiac rehab once she has completed home therapy  2) Neuropathy: -continue gabapentin and cymbalta.   3) Dysphagia -MBS ordered

## 2022-11-22 NOTE — Patient Instructions (Addendum)
It was nice seeing you today!  Change the dressing daily using a non-stick pad. Apply Vaseline to the pad every other day.  Go to the ultrasound appointment as scheduled.  Stay well, [name] Deeds, MD [facility] [phone]  --  Make sure to check out at the front desk before you leave today.  Please arrive at least 15 minutes prior to your scheduled appointments.  If you had blood work today, I will send you a MyChart message or a letter if results are normal. Otherwise, I will give you a call.  If you had a referral placed, they will call you to set up an appointment. Please give [place] a call if you don't hear back in the next 2 weeks.  If you need additional refills before your next appointment, please call your pharmacy first.

## 2022-11-22 NOTE — Progress Notes (Cosign Needed Addendum)
    SUBJECTIVE:   CHIEF COMPLAINT / HPI:  Chief Complaint  Patient presents with   Follow-up    Right     Patient is here for follow-up for a right thigh wound.  Last week, her wound was healing well and I have replaced her hydrocolloid dressing which was to remain in place until follow-up today.  She is here today with her son and daughter.  She is still having some itching/burning at the site of her wound.  She has kept on the bandage since last week.  PERTINENT  PMH / PSH: Atrial fibrillation s/p DCCV on apixaban, HFpEF, DVT   Patient Care Team: [name], MD as PCP - General (Family Medicine)   OBJECTIVE:   BP (!) 114/56   Pulse 66   SpO2 97%   Physical Exam Constitutional:      General: She is not in acute distress. Cardiovascular:     Rate and Rhythm: Normal rate and regular rhythm.  Pulmonary:     Effort: Pulmonary effort is normal. No respiratory distress.     Breath sounds: Normal breath sounds.  Musculoskeletal:     Cervical back: Neck supple.  Skin:    Comments: Right distal medial thigh wound measuring approximately 1 cm x 2 cm appears moist with granulation tissue, no significant surrounding erythema  Neurological:     Mental Status: She is alert.         {Show previous vital signs (optional):23777}    ASSESSMENT/PLAN:   Pressure injury of right thigh, unstageable (HCC) Healing well with hydrocolloid dressing, wound is quite moist so we will transition to nonadherent dressing. - daily dressing changes with nonadherent pad, apply Vaseline every other day - wound dressed in office today, provided a few extra nonadherent pads   Fluid in endometrial cavity  CT abdomen during recent hospital admission with incidental finding of endometrial fluid and/or distention recommending follow-up pelvic ultrasound - Plan: US Pelvic Complete With Transvaginal  Encounter for immunization - Plan: Pfizer Fall 2023 Covid-19 Vaccine 52yrs and older   Return in 6  days (on 11/28/2022) for f/u right thigh wound.   [name], MD [facility]

## 2022-11-27 ENCOUNTER — Ambulatory Visit (HOSPITAL_COMMUNITY)
Admission: RE | Admit: 2022-11-27 | Discharge: 2022-11-27 | Disposition: A | Discharge status: Home / Self Care | Payer: Medicare Other | Source: Ambulatory Visit | Attending: Family Medicine | Admitting: Family Medicine

## 2022-11-27 ENCOUNTER — Other Ambulatory Visit (HOSPITAL_COMMUNITY): Payer: Self-pay

## 2022-11-27 DIAGNOSIS — N859 Noninflammatory disorder of uterus, unspecified: Secondary | ICD-10-CM | POA: Insufficient documentation

## 2022-11-27 DIAGNOSIS — R059 Cough, unspecified: Secondary | ICD-10-CM

## 2022-11-27 DIAGNOSIS — R1310 Dysphagia, unspecified: Secondary | ICD-10-CM

## 2022-11-28 ENCOUNTER — Ambulatory Visit (INDEPENDENT_AMBULATORY_CARE_PROVIDER_SITE_OTHER): Payer: Medicare Other | Admitting: Student

## 2022-11-28 ENCOUNTER — Encounter: Payer: Self-pay | Admitting: Student

## 2022-11-28 VITALS — BP 127/102 | HR 100 | Wt 143.2 lb

## 2022-11-28 DIAGNOSIS — L89210 Pressure ulcer of right hip, unstageable: Secondary | ICD-10-CM

## 2022-11-28 DIAGNOSIS — M79604 Pain in right leg: Secondary | ICD-10-CM | POA: Diagnosis present

## 2022-11-28 MED ORDER — OXYCODONE-ACETAMINOPHEN 2.5-325 MG PO TABS: 1.0000 | ORAL_TABLET | ORAL | 0 refills | Status: AC | PRN

## 2022-11-28 NOTE — Progress Notes (Addendum)
SUBJECTIVE:   CHIEF COMPLAINT / HPI:   Right Thigh Wound F/u: Seen with Dr. Nancy Fetter on 11/24/2022 for pressure injury of the right thigh.  At that time, she had transitioned from hydrocolloid dressing to nonadherent pads given how moist the wound was.  Today she reports that the area is painful, 7-8/10 pain. Only transfers to bathroom and short distance for ambulation. Denies B symptoms.   Pelvic ultrasound of the endometrium previously revealed for 4 mm endometrial complex though minimally irregular with small amount of nonspecific endometrial fluid.   PERTINENT  PMH / PSH: anxiety, DM, DVT, essential tremor, HLD, ischemic stroke, peripheral neuropathy, AFib    OBJECTIVE:  BP (!) 127/102   Pulse 100   Wt 143 lb 3.2 oz (65 kg)   SpO2 100%   BMI 27.97 kg/m  Physical Exam Vitals reviewed.  Constitutional:      General: She is not in acute distress.    Appearance: Normal appearance. She is not ill-appearing or toxic-appearing.     Comments: Elderly, sitting in wheelchair  HENT:     Head: Normocephalic.     Nose: Nose normal.     Mouth/Throat:     Mouth: Mucous membranes are moist.  Eyes:     Conjunctiva/sclera: Conjunctivae normal.  Cardiovascular:     Rate and Rhythm: Normal rate and regular rhythm.  Pulmonary:     Effort: Pulmonary effort is normal.     Breath sounds: Normal breath sounds.  Abdominal:     General: Abdomen is flat. Bowel sounds are normal.     Palpations: Abdomen is soft.  Musculoskeletal:     Cervical back: Normal range of motion.     Comments: Healing right inner thigh wound with no drainage or bleeding, granulation tissue formation without fluctuance or heat  Skin:    Capillary Refill: Capillary refill takes less than 2 seconds.  Neurological:     Mental Status: She is alert. Mental status is at baseline.  Psychiatric:        Mood and Affect: Mood normal.        Behavior: Behavior normal.    Today:  11/22/22   ASSESSMENT/PLAN:  Right leg  pain -     oxyCODONE-Acetaminophen; Take 1 tablet by mouth every 4 (four) hours as needed for up to 3 days for pain.  Dispense: 15 tablet; Refill: 0 -     MR FEMUR RIGHT W WO CONTRAST; Future  Pressure injury of right thigh, unstageable ([place]) Assessment & Plan: Currently seems to be healing quite well.  However, given severity of pain, will continue with MRI to assess for abscess.  Reassuringly, no evidence of this on physical examination.  Given severity of pain, discussed the option of using oxycodone 2.5 mg every 6 hours for right through pain.  Continuing with MRI of the femur on the right for assessment of possible abscess, normal kidney function   Pelvic ultrasound borderline findings for need of endometrial biopsy.  Given patient's age and comorbidities, with shared decision making, we decided to monitor and provided them with red flags to watch out for.   Blood pressure seemingly inaccurate.  Attempted to perform a manual blood pressure on several occasions but unable to find a working cuff.  Per patient family, her systolic is in the low 782N and diastolic blood pressure is also lower at home.  Discussed with family member that they should check blood pressures at home and update [place] if they are elevated.  Return in about  3 weeks (around 12/19/2022) for right thigh wound . Erskine Emery, MD 11/29/2022, 7:20 AM PGY-2, [place]

## 2022-11-28 NOTE — Patient Instructions (Addendum)
It was great to see you today! Thank you for choosing [facility] for your primary care. [name] [name] was seen for follow up.  Today we addressed: -Continue with the current dressing changes  -Vasoline every other day  -We will continue with a MRI of the right femur with contrast  -You can take the Percocet every 4-6hrs as needed but not with tylenol   If you haven't already, sign up for My Chart to have easy access to your labs results, and communication with your primary care physician.  I recommend that you always bring your medications to each appointment as this makes it easy to ensure you are on the correct medications and helps [place] not miss refills when you need them. Call the clinic at [phone] if your symptoms worsen or you have any concerns.  You should return to our clinic Return in about 3 weeks (around 12/19/2022) for right thigh wound . Please arrive 15 minutes before your appointment to ensure smooth check in process.  We appreciate your efforts in making this happen.  Thank you for allowing me to participate in your care, Erskine Emery, MD 11/28/2022, 3:04 PM PGY-2, [place]

## 2022-11-29 ENCOUNTER — Encounter: Payer: Self-pay | Admitting: Student

## 2022-11-29 ENCOUNTER — Telehealth: Payer: Self-pay

## 2022-11-29 DIAGNOSIS — M79604 Pain in right leg: Secondary | ICD-10-CM | POA: Insufficient documentation

## 2022-11-29 NOTE — Telephone Encounter (Signed)
Patient's granddaughter calls nurse line regarding pelvic ultrasound results and questions about biopsy. She states that her uncle did not understand what was being discussed and she would like to speak with provider further regarding this.   Please return call to granddaughter, Jeneen Montgomery at [phone].  Talbot Grumbling, RN

## 2022-11-29 NOTE — Assessment & Plan Note (Signed)
Currently seems to be healing quite well.  However, given severity of pain, will continue with MRI to assess for abscess.  Reassuringly, no evidence of this on physical examination.  Given severity of pain, discussed the option of using oxycodone 2.5 mg every 6 hours for right through pain.  Continuing with MRI of the femur on the right for assessment of possible abscess, normal kidney function

## 2022-12-01 ENCOUNTER — Ambulatory Visit (HOSPITAL_COMMUNITY)
Admission: RE | Admit: 2022-12-01 | Discharge: 2022-12-01 | Disposition: A | Discharge status: Home / Self Care | Payer: Medicare Other | Source: Ambulatory Visit | Attending: Family Medicine | Admitting: Family Medicine

## 2022-12-01 DIAGNOSIS — M79604 Pain in right leg: Secondary | ICD-10-CM | POA: Insufficient documentation

## 2022-12-01 MED ORDER — GADOBUTROL 1 MMOL/ML IV SOLN
6.0000 mL | Freq: Once | INTRAVENOUS | Status: AC | PRN
  Administered 2022-12-01: 6 mL via INTRAVENOUS

## 2022-12-02 NOTE — Telephone Encounter (Signed)
Returned call to granddaughter. She did not answer, left VM asking her to return call to office to schedule appointment.   Talbot Grumbling, RN

## 2022-12-08 ENCOUNTER — Encounter: Payer: Self-pay | Admitting: Student

## 2022-12-12 ENCOUNTER — Telehealth: Payer: Self-pay | Admitting: Family Medicine

## 2022-12-12 ENCOUNTER — Other Ambulatory Visit: Payer: Self-pay | Admitting: Family Medicine

## 2022-12-12 ENCOUNTER — Ambulatory Visit (HOSPITAL_COMMUNITY)
Admission: RE | Admit: 2022-12-12 | Discharge: 2022-12-12 | Disposition: A | Discharge status: Home / Self Care | Payer: Medicare Other | Source: Ambulatory Visit | Attending: Physical Medicine and Rehabilitation | Admitting: Physical Medicine and Rehabilitation

## 2022-12-12 ENCOUNTER — Ambulatory Visit (HOSPITAL_COMMUNITY)
Admission: RE | Admit: 2022-12-12 | Discharge: 2022-12-12 | Disposition: A | Discharge status: Home / Self Care | Payer: Medicare Other | Source: Ambulatory Visit

## 2022-12-12 DIAGNOSIS — R059 Cough, unspecified: Secondary | ICD-10-CM | POA: Insufficient documentation

## 2022-12-12 DIAGNOSIS — I4891 Unspecified atrial fibrillation: Secondary | ICD-10-CM | POA: Diagnosis not present

## 2022-12-12 DIAGNOSIS — R1310 Dysphagia, unspecified: Secondary | ICD-10-CM

## 2022-12-12 NOTE — Telephone Encounter (Signed)
Done

## 2022-12-12 NOTE — Telephone Encounter (Signed)
Patient's son dropped off level of care form to be completed. Last DOS was 11/28/22. Placed in [employer].

## 2022-12-12 NOTE — Progress Notes (Signed)
Modified Barium Swallow Progress Note  Patient Details  Name: [name] [name] MRN: [id number] Date of Birth: [birth year]  Today's Date: 12/12/2022  Modified Barium Swallow completed.  Full report located under Chart Review in the Imaging Section.  Brief recommendations include the following:  Clinical Impression  Pt demonstrates functional swallowing, normal in advanced age including slight delay in epiglottic inversion with flash penetration with most thin liquid sips. Pt protected airway throughout. She was noted to have a prominent CP without any stasis or backflow observed during study. Esophageal sweep showed no stasis including barium tablet. Pt to continue current diet, avoiding dry solids, following bites with creamy solids if there is globus. Son present for education. Pt may benefit from f/u education with referring SLP.   Swallow Evaluation Recommendations       SLP Diet Recommendations: Regular solids;Dysphagia 3 (Mech soft) solids;Thin liquid      Analea Muller, Katherene Ponto 12/12/2022,12:56 PM

## 2022-12-13 NOTE — Telephone Encounter (Signed)
Form completed, placed in RN box. Please include med list and problem list. Thanks.

## 2022-12-13 NOTE — Telephone Encounter (Signed)
Left some forms in your box that needs to be completed. 

## 2022-12-16 NOTE — Telephone Encounter (Signed)
Med list and problem list attached. Called son and LVM informing that paperwork is ready for pick up. Copy made and placed in batch scanning. Original at front desk for pick up.   Talbot Grumbling, RN

## 2022-12-16 NOTE — Patient Instructions (Addendum)
It was nice seeing you today!  Keep changing the dressing daily and use Vaseline every day instead of every other day.  Try a humidifier and the palm for dry mouth.  PT referral for the leg pain.  Stay well, Zola Button, MD [place] [phone]  --  Make sure to check out at the front desk before you leave today.  Please arrive at least 15 minutes prior to your scheduled appointments.  If you had blood work today, I will send you a MyChart message or a letter if results are normal. Otherwise, I will give you a call.  If you had a referral placed, they will call you to set up an appointment. Please give [place] a call if you don't hear back in the next 2 weeks.  If you need additional refills before your next appointment, please call your pharmacy first.

## 2022-12-16 NOTE — Progress Notes (Signed)
SUBJECTIVE:   CHIEF COMPLAINT / HPI:  Chief Complaint  Patient presents with   Follow-up    Patient returns today for follow-up for right thigh wound.  She was last seen in office by my colleague 3 weeks ago.  Wound was healing well but due to severity of pain, she was given a short course of oxycodone-acetaminophen as needed and MRI was ordered to evaluate for abscess which was ultimately unremarkable without signs of abscess or osteomyelitis.  Incidentally did find small right knee joint effusion with mild OA and chondrocalcinosis.  Patient is here today with son.  Son has been performing dressing changes daily with nonadherent pad and using Vaseline every other day.  Patient reports that she is still having persistent right leg pain which she feels is not related to the wound.  Pain is primarily at night.  Does not have much pain with ambulation.  She ambulates with the assistance of a walker.  This pain has been persistent ever since she was discharged from the hospital 2 months ago.  She is amenable to more physical therapy.  She also has been having some dry mouth at night.  PERTINENT  PMH / PSH: Atrial fibrillation s/p DCCV on apixaban, HFpEF, DVT   Patient Care Team: Zola Button, MD as PCP - General (Family Medicine)   OBJECTIVE:   BP (!) 113/54   Pulse 66   Ht 5' (1.524 m)   Wt 145 lb 3.2 oz (65.9 kg)   SpO2 97%   BMI 28.36 kg/m   Physical Exam Constitutional:      General: She is not in acute distress.    Comments: Seated in wheelchair  HENT:     Head: Normocephalic and atraumatic.     Mouth/Throat:     Mouth: Mucous membranes are moist.  Cardiovascular:     Rate and Rhythm: Normal rate and regular rhythm.  Pulmonary:     Effort: Pulmonary effort is normal. No respiratory distress.     Breath sounds: Normal breath sounds.  Musculoskeletal:     Cervical back: Neck supple.     Comments: No tenderness to palpation of right trochanter area.  Internal and  external rotation without pain.  Skin:    Comments: Right distal medial thigh wound measuring approximately 2.5 cm without any active drainage, appears dry with some scar tissue.  Neurological:     Mental Status: She is alert.          12/20/2022    1:38 PM  Depression screen PHQ 2/9  Decreased Interest 1  Down, Depressed, Hopeless 1  PHQ - 2 Score 2  Altered sleeping 1  Tired, decreased energy 2  Change in appetite 0  Feeling bad or failure about yourself  1  Trouble concentrating 1  Moving slowly or fidgety/restless 2  Suicidal thoughts 0  PHQ-9 Score 9  Difficult doing work/chores Extremely dIfficult     {Show previous vital signs (optional):23777}    ASSESSMENT/PLAN:   Pressure injury of right thigh, unstageable ([place]) Continuing to show improvement.  Continue nonadherent pad dressing changes but advised to do Vaseline every day instead of every other day.   Right hip pain Chronic right hip pain primarily at night ongoing for about 2 months.  She does have known right lower extremity DVT but I do not think this is playing a significant role in her pain.  I do not think this is OA or other intrinsic joint pathology.  Deconditioning likely playing  a role. - ref PT  Return in about 3 weeks (around 01/10/2023) for f/u wound.   Zola Button, MD [place]

## 2022-12-20 ENCOUNTER — Encounter: Payer: Self-pay | Admitting: Family Medicine

## 2022-12-20 ENCOUNTER — Ambulatory Visit (INDEPENDENT_AMBULATORY_CARE_PROVIDER_SITE_OTHER): Payer: Medicare Other | Admitting: Family Medicine

## 2022-12-20 VITALS — BP 113/54 | HR 66 | Ht 60.0 in | Wt 145.2 lb

## 2022-12-20 DIAGNOSIS — M25551 Pain in right hip: Secondary | ICD-10-CM

## 2022-12-20 DIAGNOSIS — L89210 Pressure ulcer of right hip, unstageable: Secondary | ICD-10-CM | POA: Diagnosis present

## 2022-12-20 NOTE — Assessment & Plan Note (Signed)
Continuing to show improvement.  Continue nonadherent pad dressing changes but advised to do Vaseline every day instead of every other day.

## 2023-01-02 ENCOUNTER — Other Ambulatory Visit: Payer: Self-pay | Admitting: Family Medicine

## 2023-01-05 ENCOUNTER — Other Ambulatory Visit: Payer: Self-pay | Admitting: Family Medicine

## 2023-01-07 ENCOUNTER — Ambulatory Visit: Payer: Medicare Other

## 2023-01-10 ENCOUNTER — Ambulatory Visit (INDEPENDENT_AMBULATORY_CARE_PROVIDER_SITE_OTHER): Payer: Medicare Other | Admitting: Family Medicine

## 2023-01-10 ENCOUNTER — Encounter: Payer: Self-pay | Admitting: Family Medicine

## 2023-01-10 VITALS — BP 100/64 | HR 55 | Ht 60.0 in | Wt 145.6 lb

## 2023-01-10 DIAGNOSIS — L89210 Pressure ulcer of right hip, unstageable: Secondary | ICD-10-CM

## 2023-01-10 DIAGNOSIS — R051 Acute cough: Secondary | ICD-10-CM | POA: Diagnosis not present

## 2023-01-10 NOTE — Progress Notes (Signed)
    SUBJECTIVE:   CHIEF COMPLAINT / HPI:  Chief Complaint  Patient presents with   Follow-up   Dry mouth   Cough    At last visit right thigh wound was continuing to heal well, advised patient to continue nonadherent pad dressing changes and increase Vaseline to daily.  Son feels like wound is healing well.  Still doing daily dressing changes.  Reports cough for 1 week. Mild rhinorrhea. Denies fever, chills, congestion, chest pain, SOB. Has some DOE at baseline with minimal activity.  Also reports dry mouth  PERTINENT  PMH / PSH: Atrial fibrillation s/p DCCV on apixaban, HFpEF, DVT   Patient Care Team: Zola Button, MD as PCP - General (Family Medicine)   OBJECTIVE:   BP 100/64   Pulse (!) 55   Ht 5' (1.524 m)   Wt 145 lb 9.6 oz (66 kg)   SpO2 97%   BMI 28.44 kg/m   Physical Exam Constitutional:      General: She is not in acute distress. HENT:     Mouth/Throat:     Mouth: Mucous membranes are moist.     Pharynx: Oropharynx is clear.  Cardiovascular:     Rate and Rhythm: Normal rate and regular rhythm.  Pulmonary:     Effort: Pulmonary effort is normal. No respiratory distress.     Comments: Faint bibasilar rales Musculoskeletal:     Cervical back: Neck supple.  Skin:    Comments: Right thigh wound measuring approximately 3 cm appears dry without any open areas, some dry skin, hyperpigmentation  Neurological:     Mental Status: She is alert.         {Show previous vital signs (optional):23777}    ASSESSMENT/PLAN:   Pressure injury of right thigh, unstageable ([place]) Healing very well, wound is now completely closed but there is still some hyperpigmentation.  Advised that she can discontinue dressing changes and just continue Vaseline until fully resolved.  No further follow-up needed.   Cough Cough for 1 week in the absence of other viral symptoms, otherwise feeling well.  Could be related to dry throat or postnasal drip.  Advised that she can try  Flonase to see if this helps.  Dry mouth could be related to medications.  Reassurance provided.  Return in about 3 months (around 04/10/2023) for f/u HTN.   Zola Button, MD [place]

## 2023-01-10 NOTE — Patient Instructions (Addendum)
It was nice seeing you today!  You can stop using the bandages if you want. You can just use Vaseline until it is fully healed.  You can try Flonase nasal spray if there is drainage in the back of your throat.  You could also try over the counter throat spray.  The pantoprazole (Protonix) which can help with acid reflux, you can decrease to once a day or just take as needed.  Stay well, Zola Button, MD [place] [phone]  --  Make sure to check out at the front desk before you leave today.  Please arrive at least 15 minutes prior to your scheduled appointments.  If you had blood work today, I will send you a MyChart message or a letter if results are normal. Otherwise, I will give you a call.  If you had a referral placed, they will call you to set up an appointment. Please give [place] a call if you don't hear back in the next 2 weeks.  If you need additional refills before your next appointment, please call your pharmacy first.

## 2023-01-10 NOTE — Assessment & Plan Note (Signed)
Healing very well, wound is now completely closed but there is still some hyperpigmentation.  Advised that she can discontinue dressing changes and just continue Vaseline until fully resolved.  No further follow-up needed.

## 2023-01-16 NOTE — Therapy (Signed)
OUTPATIENT PHYSICAL THERAPY LOWER EXTREMITY EVALUATION   Patient Name: [name] [name] MRN: [id number] DOB:1930-01-22, 89 y.o., female Today's Date: 01/17/2023  END OF SESSION:  PT End of Session - 01/17/23 1228     Visit Number 1    Number of Visits 7    Date for PT Re-Evaluation 03/14/23    Authorization Type Medicare + MCD secondary    Authorization Time Period no auth    Progress Note Due on Visit 10    PT Start Time 1230    PT Stop Time 1305    PT Time Calculation (min) 35 min    Activity Tolerance Patient tolerated treatment well;No increased pain    Behavior During Therapy [facility] for tasks assessed/performed             Past Medical History:  Diagnosis Date   Anxiety    Diabetes ([place]) 10/27/2013   DVT (deep venous thrombosis) ([place])    Essential tremor 10/27/2013   High cholesterol 1999   Hypertension    Ischemic stroke ([place]) 07/18/2014   Obesity, unspecified 10/27/2013   Pneumonia    Unspecified hereditary and idiopathic peripheral neuropathy 10/27/2013   Past Surgical History:  Procedure Laterality Date   BIOPSY  09/26/2022   Procedure: BIOPSY;  Surgeon: Jackquline Denmark, MD;  Location: [place];  Service: Gastroenterology;;   CATARACT EXTRACTION Bilateral    ESOPHAGOGASTRODUODENOSCOPY (EGD) WITH PROPOFOL N/A 09/26/2022   Procedure: ESOPHAGOGASTRODUODENOSCOPY (EGD) WITH PROPOFOL;  Surgeon: Jackquline Denmark, MD;  Location: [place];  Service: Gastroenterology;  Laterality: N/A;   FOOT SURGERY     IR KYPHO LUMBAR INC FX REDUCE BONE BX UNI/BIL CANNULATION INC/IMAGING  06/15/2019   Patient Active Problem List   Diagnosis Date Noted   Right leg pain 11/29/2022   Debility 10/04/2022   Bilateral pleural effusion 10/02/2022   Superficial venous thrombosis of right upper extremity 09/29/2022   Gastric ulcer 09/28/2022   Bilateral hip pain with RLE DVT 09/17/2022   Atrial fibrillation ([place]) 09/16/2022   Chronic pain of left knee 07/26/2022   Urinary incontinence  12/06/2020   CHF (congestive heart failure), NYHA class III, chronic, diastolic ([place]) 123456   Osteopenia of multiple sites 11/10/2018   Back pain 10/10/2016   Seizures ([place]) 08/06/2014   History of CVA (cerebrovascular accident) 08/05/2014   HTN (hypertension) 07/18/2014   HLD (hyperlipidemia) 07/18/2014   Essential tremor 10/27/2013   Overweight 10/27/2013   History of peripheral neuropathy 10/27/2013    PCP: Zola Button, MD  REFERRING PROVIDER: McDiarmid, Blane Ohara, MD  REFERRING DIAG: [id number] (ICD-10-CM) - Right hip pain  THERAPY DIAG:  Pain in right hip  Other low back pain  Muscle weakness (generalized)  Rationale for Evaluation and Treatment: Rehabilitation  ONSET DATE: a couple of months ago  SUBJECTIVE:   SUBJECTIVE STATEMENT: Pt arrives in wheelchair w/ son ([facility] per his report) who provides translation w/ pt consent, politely declines use of interpreter services.  Reports hx of R hip since hospitalization in autumn 2023, does endorse some numbness in the leg as well although son notes this may be chronic deficit from stroke. Also has a R thigh wounds (examined and appears to be healing well), son states they have been following up with MD every couple weeks and they are pleased with how wound is healing. Pt uses wheelchair for community, RW at home for short distances but is limited primarily by SOB and fatigue. She states her main issue with her R hip is weakness and wants  to try to get stronger.   PERTINENT HISTORY: DM, DVT, essential tremor, HTN, ischemic stroke, peripheral neuropathy, CHF, osteopenia PAIN:  Are you having pain: back pain unrated on NPS Location/description: numbness/stiffness Best-worst over past week: 0-5/10  Per eval -  - aggravating factors: walking, standing  - Easing factors: sitting rest breaks     PRECAUTIONS: fall risk, R thigh wound, cardiac hx, seizure, osteopenia  WEIGHT BEARING RESTRICTIONS: No  FALLS:  Has patient  fallen in last 6 months? No  LIVING ENVIRONMENT: 1 story but 2 steps to dining room w/ B. 2 STE Lives w/ son who assists as needed  OCCUPATION: retired - Materials engineer    PLOF: Pt performs dressing and toileting without assist. Weekly assist for bathing   PATIENT GOALS: get stronger   NEXT MD VISIT: March   OBJECTIVE:   DIAGNOSTIC FINDINGS:  MRI R Femur 12/02/22 IMPRESSION: 1. The wound along the posterolateral distal thigh just above the knee demonstrates cutaneous enhancement and potentially some minimal adjacent subcutaneous enhancement. No underlying abscess or osteomyelitis. 2. Small right knee joint effusion with mild osteoarthritis and chondrocalcinosis of the menisci suggesting CPPD arthropathy. No findings of substantial synovitis or septic joint.   PATIENT SURVEYS:  Deferred given time constraints and language barrier  COGNITION: Overall cognitive status: assessment confounded by language barrier but does appear conversationally appropriate in communication w/ son, pleasant and agreeable throughout   SENSATION: Light touch intact but reduced along entirety of RLE compared to L - states this is chronic, son thinks it may be attributable to previous stroke  POSTURE: kyphosis present in both sitting and standing  PALPATION: Concordant tenderness R proximal glute and distal QL  LOWER EXTREMITY ROM:     Active  Right eval Left eval  Hip flexion >90 deg against gravity >90 deg against gravity  Hip extension    Hip internal rotation    Hip external rotation    Knee extension -10 against gravity - 5 against gravity  Knee flexion >90deg grossly  >90 deg grossly  (Blank rows = not tested) (Key: WFL = within functional limits not formally assessed, * = concordant pain, s = stiffness/stretching sensation, NT = not tested)  Comments: majority of ROM performed in chair d/t mobility deficits   LOWER EXTREMITY MMT:    MMT Right eval Left eval  Hip flexion 3+ 4+   Hip abduction (modified sitting) 4+ 4+  Hip internal rotation    Hip external rotation    Knee flexion 4 4  Knee extension 3- 3+  Ankle dorsiflexion 4 4   (Blank rows = not tested) (Key: WFL = within functional limits not formally assessed, * = concordant pain, s = stiffness/stretching sensation, NT = not tested)  Comments: above painless   FUNCTIONAL TESTS:  Sit to stand from wheelchair: RW, SBA. Inc time and effort, heavy B UE support from armrests. Knee extension>hip ext>trunk ext.    GAIT: Distance walked: <31f Assistive device utilized: Walker - 2 wheeled Level of assistance: SBA Comments: shuffling steps, fwd flexed posture, reduced hip extension   TODAY'S TREATMENT:  [facility] Adult PT Treatment:                                                DATE: 01/17/23 Therapeutic Exercise: Seated marches x10 B LE Seated hip abduction X10 B LE BW Seated heel/toe raises x10 B LE HEP review + handout, education on breaking up throughout day, pacing/safety   PATIENT EDUCATION:  Education details: Pt education on PT impairments, prognosis, and POC. Informed consent. Rationale for interventions, safe/appropriate HEP performance Person educated: Patient, caregiver Education method: Explanation, Demonstration, Tactile cues, Verbal cues, and Handouts Education comprehension: verbalized understanding, returned demonstration, verbal cues required, tactile cues required, and needs further education    HOME EXERCISE PROGRAM: Access Code: [email or web address] URL: [email or web address] Date: 01/17/2023 Prepared by: Enis Slipper  Exercises - Seated March  - 1 x daily - 7 x weekly - 3 sets - 10 reps - Seated Single Leg Hip Abduction  - 1 x daily - 7 x weekly - 3 sets - 10 reps - Seated Toe Raise  - 1 x daily - 7 x weekly - 3 sets - 10 reps  ASSESSMENT:  CLINICAL  IMPRESSION: Patient is a pleasant 89 y.o. woman who was seen today for physical therapy evaluation and treatment for R hip pain. She is accompanied by her son who is primary caregiver, declines interpreter services in favor of family translation. Pt endorses trouble with R hip since hospitalization last autumn, feels she is essentially back to functional baseline although R LE weakness persists. Pt demos generalized weakness more pronounced on RLE, difficulty w/ transfers, and reduced activity on this date. Concordant pain elicited with palpation of R glutes/QL. Tolerates HEP well without issue, education on pacing and performance throughout day, safety w/ activity. No adverse events, tolerates examination well. Pt and son verbalize agreement with plan as below. Recommend skilled PT to address aforementioned deficits with aim to maximize functional tolerance and mitigate functional decline. Pt departs today's session in no acute distress, all voiced questions/concerns addressed appropriately from PT perspective.   OBJECTIVE IMPAIRMENTS: Abnormal gait, decreased activity tolerance, decreased endurance, decreased mobility, difficulty walking, decreased ROM, decreased strength, improper body mechanics, postural dysfunction, and pain.   ACTIVITY LIMITATIONS: carrying, lifting, bending, standing, squatting, stairs, transfers, bathing, and locomotion level  PARTICIPATION LIMITATIONS: meal prep, cleaning, laundry, driving, and community activity  PERSONAL FACTORS: Age, Time since onset of injury/illness/exacerbation, and 3+ comorbidities: DM, essential tremor, CVA, osteopenia, cardiac issues  are also affecting patient's functional outcome.   REHAB POTENTIAL: Fair given comorbidities, age, and overall mobility level  CLINICAL DECISION MAKING: Stable/uncomplicated  EVALUATION COMPLEXITY: Low   GOALS: Goals reviewed with patient? No  SHORT TERM GOALS: Target date: 02/07/2023 Pt will demonstrate  appropriate understanding and performance of initially prescribed HEP in order to facilitate improved independence with management of symptoms.  Baseline: HEP provided on eval Goal status: INITIAL  LONG TERM GOALS: Target date: 02/28/2023  1.  Pt will demonstrate knee extension AROM that is within 3 degrees of symmetrical in order to facilitate improved tolerance to functional movements such as standing/walking.  Baseline: see ROM chart above Goal status: INITIAL  2.  Pt will demonstrate grossly symmetrical hip/knee MMT for improved strength with functional mobility. Baseline: see MMT chart above Goal status: INITIAL  3.  Pt/caregiver will demonstrate independence with final prescribed HEP to facilitate improved self efficacy with  management of symptoms. Baseline: Initial HEP provided Goal status: INITIAL   4. Pt will be able to perform STS transfer mod I with unilat UE support on W/C and unilat UE support on LRAD in order to maximize functional strength/tolerance for transfers.   Baseline: see above for mechanics   Goal status: INITIAL    PLAN:  PT FREQUENCY: 1x/week  PT DURATION: 6 weeks  PLANNED INTERVENTIONS: Therapeutic exercises, Therapeutic activity, Neuromuscular re-education, Balance training, Gait training, Patient/Family education, Self Care, Joint mobilization, Stair training, Cryotherapy, Moist heat, Taping, Manual therapy, and Re-evaluation  PLAN FOR NEXT SESSION: General LE AROM, consider functional mobility (STS transfers w/ RW) but monitor vitals as pt endorses SOB with functional mobility at home   Leeroy Cha PT, DPT 01/17/2023 2:36 PM

## 2023-01-17 ENCOUNTER — Encounter: Payer: Self-pay | Admitting: Physical Therapy

## 2023-01-17 ENCOUNTER — Ambulatory Visit: Payer: Medicare Other | Attending: Family Medicine | Admitting: Physical Therapy

## 2023-01-17 ENCOUNTER — Other Ambulatory Visit: Payer: Self-pay

## 2023-01-17 DIAGNOSIS — M6281 Muscle weakness (generalized): Secondary | ICD-10-CM | POA: Insufficient documentation

## 2023-01-17 DIAGNOSIS — M5459 Other low back pain: Secondary | ICD-10-CM | POA: Insufficient documentation

## 2023-01-17 DIAGNOSIS — M25551 Pain in right hip: Secondary | ICD-10-CM | POA: Diagnosis present

## 2023-01-19 ENCOUNTER — Other Ambulatory Visit: Payer: Self-pay | Admitting: Family Medicine

## 2023-01-19 DIAGNOSIS — E785 Hyperlipidemia, unspecified: Secondary | ICD-10-CM

## 2023-01-19 DIAGNOSIS — R531 Weakness: Secondary | ICD-10-CM

## 2023-02-06 ENCOUNTER — Ambulatory Visit: Payer: Medicare Other | Attending: Family Medicine | Admitting: Physical Therapy

## 2023-02-06 ENCOUNTER — Encounter: Payer: Self-pay | Admitting: Physical Therapy

## 2023-02-06 DIAGNOSIS — M6281 Muscle weakness (generalized): Secondary | ICD-10-CM | POA: Diagnosis present

## 2023-02-06 DIAGNOSIS — M25551 Pain in right hip: Secondary | ICD-10-CM | POA: Insufficient documentation

## 2023-02-06 DIAGNOSIS — M5459 Other low back pain: Secondary | ICD-10-CM | POA: Diagnosis present

## 2023-02-06 NOTE — Therapy (Signed)
OUTPATIENT PHYSICAL THERAPY TREATMENT NOTE   Patient Name: [name] [name] MRN: [id number] DOB:19-Jun-1930, 89 y.o., female 19 Date: 02/06/2023  PCP: Zola Button, MD   REFERRING PROVIDER: McDiarmid, Blane Ohara, MD  END OF SESSION:   PT End of Session - 02/06/23 1318     Visit Number 2    Number of Visits 7    Date for PT Re-Evaluation 03/14/23    Authorization Type Medicare + MCD secondary    Authorization Time Period no auth    PT Start Time 1315    PT Stop Time 1355    PT Time Calculation (min) 40 min             Past Medical History:  Diagnosis Date   Anxiety    Diabetes ([place]) 10/27/2013   DVT (deep venous thrombosis) ([place])    Essential tremor 10/27/2013   High cholesterol 1999   Hypertension    Ischemic stroke ([place]) 07/18/2014   Obesity, unspecified 10/27/2013   Pneumonia    Unspecified hereditary and idiopathic peripheral neuropathy 10/27/2013   Past Surgical History:  Procedure Laterality Date   BIOPSY  09/26/2022   Procedure: BIOPSY;  Surgeon: Jackquline Denmark, MD;  Location: [place];  Service: Gastroenterology;;   CATARACT EXTRACTION Bilateral    ESOPHAGOGASTRODUODENOSCOPY (EGD) WITH PROPOFOL N/A 09/26/2022   Procedure: ESOPHAGOGASTRODUODENOSCOPY (EGD) WITH PROPOFOL;  Surgeon: Jackquline Denmark, MD;  Location: [place];  Service: Gastroenterology;  Laterality: N/A;   FOOT SURGERY     IR KYPHO LUMBAR INC FX REDUCE BONE BX UNI/BIL CANNULATION INC/IMAGING  06/15/2019   Patient Active Problem List   Diagnosis Date Noted   Right leg pain 11/29/2022   Debility 10/04/2022   Bilateral pleural effusion 10/02/2022   Superficial venous thrombosis of right upper extremity 09/29/2022   Gastric ulcer 09/28/2022   Bilateral hip pain with RLE DVT 09/17/2022   Atrial fibrillation ([place]) 09/16/2022   Chronic pain of left knee 07/26/2022   Urinary incontinence 12/06/2020   CHF (congestive heart failure), NYHA class III, chronic, diastolic ([place]) 123456    Osteopenia of multiple sites 11/10/2018   Back pain 10/10/2016   Seizures ([place]) 08/06/2014   History of CVA (cerebrovascular accident) 08/05/2014   HTN (hypertension) 07/18/2014   HLD (hyperlipidemia) 07/18/2014   Essential tremor 10/27/2013   Overweight 10/27/2013   History of peripheral neuropathy 10/27/2013    REFERRING DIAG: M25.551 (ICD-10-CM) - Right hip pain  THERAPY DIAG:  Pain in right hip  Other low back pain  Rationale for Evaluation and Treatment Rehabilitation  PERTINENT HISTORY: DM, DVT, essential tremor, HTN, ischemic stroke, peripheral neuropathy, CHF, osteopenia  PRECAUTIONS: fall risk, R thigh wound, cardiac hx, seizure, osteopenia  SUBJECTIVE:  SUBJECTIVE STATEMENT:  My pain is 5/10, in the back of the right knee and in the hip. The back is no good."   PAIN:  Are you having pain:Yes:  back pain: NPS: 5/10 Location/description: numbness/stiffness Best-worst over past week: 0-5/10  Per eval -  - aggravating factors: walking, standing  - Easing factors: sitting rest breaks    OBJECTIVE: (objective measures completed at initial evaluation unless otherwise dated)   NEXT MD VISIT: March    OBJECTIVE:    DIAGNOSTIC FINDINGS:  MRI R Femur 12/02/22 IMPRESSION: 1. The wound along the posterolateral distal thigh just above the knee demonstrates cutaneous enhancement and potentially some minimal adjacent subcutaneous enhancement. No underlying abscess or osteomyelitis. 2. Small right knee joint effusion with mild osteoarthritis and chondrocalcinosis of the menisci suggesting CPPD arthropathy. No findings of substantial synovitis or septic joint.     PATIENT SURVEYS:  Deferred given time constraints and language barrier   COGNITION: Overall cognitive status: assessment  confounded by language barrier but does appear conversationally appropriate in communication w/ son, pleasant and agreeable throughout            SENSATION: Light touch intact but reduced along entirety of RLE compared to L - states this is chronic, son thinks it may be attributable to previous stroke   POSTURE: kyphosis present in both sitting and standing   PALPATION: Concordant tenderness R proximal glute and distal QL   LOWER EXTREMITY ROM:      Active  Right eval Left eval  Hip flexion >90 deg against gravity >90 deg against gravity  Hip extension      Hip internal rotation      Hip external rotation      Knee extension -10 against gravity - 5 against gravity  Knee flexion >90deg grossly  >90 deg grossly  (Blank rows = not tested) (Key: WFL = within functional limits not formally assessed, * = concordant pain, s = stiffness/stretching sensation, NT = not tested)  Comments: majority of ROM performed in chair d/t mobility deficits    LOWER EXTREMITY MMT:     MMT Right eval Left eval  Hip flexion 3+ 4+  Hip abduction (modified sitting) 4+ 4+  Hip internal rotation      Hip external rotation      Knee flexion 4 4  Knee extension 3- 3+  Ankle dorsiflexion 4 4    (Blank rows = not tested) (Key: WFL = within functional limits not formally assessed, * = concordant pain, s = stiffness/stretching sensation, NT = not tested)  Comments: above painless     FUNCTIONAL TESTS:  Sit to stand from wheelchair: RW, SBA. Inc time and effort, heavy B UE support from armrests. Knee extension>hip ext>trunk ext.     GAIT: Distance walked: <30f Assistive device utilized: Walker - 2 wheeled Level of assistance: SBA Comments: shuffling steps, fwd flexed posture, reduced hip extension     TODAY'S TREATMENT:  [facility] Adult PT Treatment:                                                 DATE: 02/06/23 Therapeutic Exercise: Seated March 10 x 3  Seated ball squeeze 10 x 3 Seated red band clam 10 x 3 Seated heel raises 10 x 3  Seated toe raises 10 x 3 Seated LAQ 10 x 3  Supine marching 10 x 3 Supine clams 10 x 3 Seated calf stretch with towel  Manual Therapy: Passive hamstring stretch STW to right anteriolateral thigh    [facility] Adult PT Treatment:                                                DATE: 01/17/23 Therapeutic Exercise: Seated marches x10 B LE Seated hip abduction X10 B LE BW Seated heel/toe raises x10 B LE HEP review + handout, education on breaking up throughout day, pacing/safety     PATIENT EDUCATION:  Education details: Pt education on PT impairments, prognosis, and POC. Informed consent. Rationale for interventions, safe/appropriate HEP performance Person educated: Patient, caregiver Education method: Explanation, Demonstration, Tactile cues, Verbal cues, and Handouts Education comprehension: verbalized understanding, returned demonstration, verbal cues required, tactile cues required, and needs further education     HOME EXERCISE PROGRAM: Access Code: [email or web address] URL: [email or web address] Date: 01/17/2023 Prepared by: Enis Slipper   Exercises - Seated March  - 1 x daily - 7 x weekly - 3 sets - 10 reps - Seated Single Leg Hip Abduction  - 1 x daily - 7 x weekly - 3 sets - 10 reps - Seated Toe Raise  - 1 x daily - 7 x weekly - 3 sets - 10 reps   ASSESSMENT:   CLINICAL IMPRESSION: Patient is a pleasant 89 y.o. woman who was seen today for physical therapy treatment for R hip pain. She is accompanied by son.  Reviewed HEP and progressed. Son reports min compliance with HEP thus far. Pt completes all therex without complaints. STW performed to right antriolateral thigh, avoided medial thigh and hamstring as this area is sensitive. May consider desensitization. Encouraged more HEP compliance.    Eval: Pt endorses  trouble with R hip since hospitalization last autumn, feels she is essentially back to functional baseline although R LE weakness persists. Pt demos generalized weakness more pronounced on RLE, difficulty w/ transfers, and reduced activity on this date. Concordant pain elicited with palpation of R glutes/QL. Tolerates HEP well without issue, education on pacing and performance throughout day, safety w/ activity. No adverse events, tolerates examination well. Pt and son verbalize agreement with plan as below. Recommend skilled PT to address aforementioned deficits with aim to maximize functional tolerance and mitigate functional decline. Pt departs today's session in no acute distress, all voiced questions/concerns addressed appropriately from PT perspective.    OBJECTIVE IMPAIRMENTS: Abnormal gait, decreased activity tolerance, decreased endurance, decreased mobility, difficulty walking, decreased ROM, decreased strength, improper body mechanics, postural dysfunction, and pain.    ACTIVITY LIMITATIONS: carrying, lifting, bending, standing, squatting, stairs, transfers, bathing, and locomotion level   PARTICIPATION LIMITATIONS: meal prep, cleaning, laundry, driving, and community activity   PERSONAL FACTORS: Age, Time since onset of injury/illness/exacerbation, and 3+ comorbidities: DM, essential  tremor, CVA, osteopenia, cardiac issues  are also affecting patient's functional outcome.    REHAB POTENTIAL: Fair given comorbidities, age, and overall mobility level   CLINICAL DECISION MAKING: Stable/uncomplicated   EVALUATION COMPLEXITY: Low     GOALS: Goals reviewed with patient? No   SHORT TERM GOALS: Target date: 02/07/2023 Pt will demonstrate appropriate understanding and performance of initially prescribed HEP in order to facilitate improved independence with management of symptoms.  Baseline: HEP provided on eval Goal status: INITIAL   LONG TERM GOALS: Target date: 02/28/2023   1.  Pt will  demonstrate knee extension AROM that is within 3 degrees of symmetrical in order to facilitate improved tolerance to functional movements such as standing/walking.  Baseline: see ROM chart above Goal status: INITIAL   2.  Pt will demonstrate grossly symmetrical hip/knee MMT for improved strength with functional mobility. Baseline: see MMT chart above Goal status: INITIAL   3.  Pt/caregiver will demonstrate independence with final prescribed HEP to facilitate improved self efficacy with management of symptoms. Baseline: Initial HEP provided Goal status: INITIAL    4. Pt will be able to perform STS transfer mod I with unilat UE support on W/C and unilat UE support on LRAD in order to maximize functional strength/tolerance for transfers.                       Baseline: see above for mechanics                       Goal status: INITIAL      PLAN:   PT FREQUENCY: 1x/week   PT DURATION: 6 weeks   PLANNED INTERVENTIONS: Therapeutic exercises, Therapeutic activity, Neuromuscular re-education, Balance training, Gait training, Patient/Family education, Self Care, Joint mobilization, Stair training, Cryotherapy, Moist heat, Taping, Manual therapy, and Re-evaluation   PLAN FOR NEXT SESSION: General LE AROM, consider functional mobility (STS transfers w/ RW) but monitor vitals as pt endorses SOB with functional mobility at home     Hessie Diener, PTA 02/06/23 2:00 PM Phone: [phone] Fax: [phone]

## 2023-02-10 ENCOUNTER — Other Ambulatory Visit: Payer: Self-pay | Admitting: Family Medicine

## 2023-02-10 DIAGNOSIS — E785 Hyperlipidemia, unspecified: Secondary | ICD-10-CM

## 2023-02-10 DIAGNOSIS — R531 Weakness: Secondary | ICD-10-CM

## 2023-02-11 NOTE — Therapy (Signed)
OUTPATIENT PHYSICAL THERAPY TREATMENT NOTE   Patient Name: [name] [name] MRN: [id number] DOB:[birth year], 89 y.o., female Today's Date: 02/14/2023  PCP: Zola Button, MD   REFERRING PROVIDER: McDiarmid, Blane Ohara, MD  END OF SESSION:   PT End of Session - 02/14/23 1319     Visit Number 3    Number of Visits 7    Date for PT Re-Evaluation 03/14/23    Authorization Type Medicare + MCD secondary    Authorization Time Period no auth    PT Start Time 1319    PT Stop Time 1355    PT Time Calculation (min) 36 min    Activity Tolerance Patient tolerated treatment well;No increased pain    Behavior During Therapy [facility] for tasks assessed/performed              Past Medical History:  Diagnosis Date   Anxiety    Diabetes ([place]) 10/27/2013   DVT (deep venous thrombosis) ([place])    Essential tremor 10/27/2013   High cholesterol 1999   Hypertension    Ischemic stroke ([place]) 07/18/2014   Obesity, unspecified 10/27/2013   Pneumonia    Unspecified hereditary and idiopathic peripheral neuropathy 10/27/2013   Past Surgical History:  Procedure Laterality Date   BIOPSY  09/26/2022   Procedure: BIOPSY;  Surgeon: Jackquline Denmark, MD;  Location: [place];  Service: Gastroenterology;;   CATARACT EXTRACTION Bilateral    ESOPHAGOGASTRODUODENOSCOPY (EGD) WITH PROPOFOL N/A 09/26/2022   Procedure: ESOPHAGOGASTRODUODENOSCOPY (EGD) WITH PROPOFOL;  Surgeon: Jackquline Denmark, MD;  Location: [place];  Service: Gastroenterology;  Laterality: N/A;   FOOT SURGERY     IR KYPHO LUMBAR INC FX REDUCE BONE BX UNI/BIL CANNULATION INC/IMAGING  06/15/2019   Patient Active Problem List   Diagnosis Date Noted   Right leg pain 11/29/2022   Debility 10/04/2022   Bilateral pleural effusion 10/02/2022   Superficial venous thrombosis of right upper extremity 09/29/2022   Gastric ulcer 09/28/2022   Bilateral hip pain with RLE DVT 09/17/2022   Atrial fibrillation ([place]) 09/16/2022   Chronic pain of left knee  07/26/2022   Urinary incontinence 12/06/2020   CHF (congestive heart failure), NYHA class III, chronic, diastolic ([place]) 123456   Osteopenia of multiple sites 11/10/2018   Back pain 10/10/2016   Seizures ([place]) 08/06/2014   History of CVA (cerebrovascular accident) 08/05/2014   HTN (hypertension) 07/18/2014   HLD (hyperlipidemia) 07/18/2014   Essential tremor 10/27/2013   Overweight 10/27/2013   History of peripheral neuropathy 10/27/2013    REFERRING DIAG: M25.551 (ICD-10-CM) - Right hip pain  THERAPY DIAG:  Pain in right hip  Other low back pain  Muscle weakness (generalized)  Rationale for Evaluation and Treatment Rehabilitation  PERTINENT HISTORY: DM, DVT, essential tremor, HTN, ischemic stroke, peripheral neuropathy, CHF, osteopenia  PRECAUTIONS: fall risk, R thigh wound, cardiac hx, seizure, osteopenia  SUBJECTIVE:  SUBJECTIVE STATEMENT:   No pain at present, felt good after last session. Appreciate family assistance w/ interpreting. Endorses good HEP compliance, improved tolerance to standing/walking but still has some hip pain that is improving. Remains mostly limited in walking but exertional fatigue per family report  PAIN:  Are you having pain: none Location/description: numbness/stiffness Best-worst over past week: 0-5/10  Per eval -  - aggravating factors: walking, standing  - Easing factors: sitting rest breaks    OBJECTIVE: (objective measures completed at initial evaluation unless otherwise dated)   NEXT MD VISIT: March    OBJECTIVE:    DIAGNOSTIC FINDINGS:  MRI R Femur 12/02/22 IMPRESSION: 1. The wound along the posterolateral distal thigh just above the knee demonstrates cutaneous enhancement and potentially some minimal adjacent subcutaneous enhancement. No  underlying abscess or osteomyelitis. 2. Small right knee joint effusion with mild osteoarthritis and chondrocalcinosis of the menisci suggesting CPPD arthropathy. No findings of substantial synovitis or septic joint.     PATIENT SURVEYS:  Deferred given time constraints and language barrier   COGNITION: Overall cognitive status: assessment confounded by language barrier but does appear conversationally appropriate in communication w/ son, pleasant and agreeable throughout            SENSATION: Light touch intact but reduced along entirety of RLE compared to L - states this is chronic, son thinks it may be attributable to previous stroke   POSTURE: kyphosis present in both sitting and standing   PALPATION: Concordant tenderness R proximal glute and distal QL   LOWER EXTREMITY ROM:      Active  Right eval Left eval  Hip flexion >90 deg against gravity >90 deg against gravity  Hip extension      Hip internal rotation      Hip external rotation      Knee extension -10 against gravity - 5 against gravity  Knee flexion >90deg grossly  >90 deg grossly  (Blank rows = not tested) (Key: WFL = within functional limits not formally assessed, * = concordant pain, s = stiffness/stretching sensation, NT = not tested)  Comments: majority of ROM performed in chair d/t mobility deficits    LOWER EXTREMITY MMT:     MMT Right eval Left eval  Hip flexion 3+ 4+  Hip abduction (modified sitting) 4+ 4+  Hip internal rotation      Hip external rotation      Knee flexion 4 4  Knee extension 3- 3+  Ankle dorsiflexion 4 4    (Blank rows = not tested) (Key: WFL = within functional limits not formally assessed, * = concordant pain, s = stiffness/stretching sensation, NT = not tested)  Comments: above painless     FUNCTIONAL TESTS:  Sit to stand from wheelchair: RW, SBA. Inc time and effort, heavy B UE support from armrests. Knee extension>hip ext>trunk ext.     GAIT: Distance walked:  <27ft Assistive device utilized: Walker - 2 wheeled Level of assistance: SBA Comments: shuffling steps, fwd flexed posture, reduced hip extension     TODAY'S TREATMENT:  [facility] Adult PT Treatment:                                                DATE: 02/14/23 Pre-standing vitals: HR 58-61 bpm, SpO2 98% RA, BP 107/59 Therapeutic Exercise: Seated adductor iso 2x15  Seated RTB abduction 2x15 Standing marches with RW 3x6 BIL LE with seated rest break between, CGA for safety Seated LAQ 2x15 cues for velocity  Standing heel raises w/ RW support CGA, 3x5 cues for form and pacing  Seated swiss ball press down, 2x8, cues for breath control and posture, appropriate force output STS x6 throughout session (transfers w/ standing exercises) RW CGA from wheelchair, tactile cues for improved trunk ext  Vitals throughout: HR 58-72bpm, SpO2 95-99% RA                                  [facility] Adult PT Treatment:                                                DATE: 02/06/23 Therapeutic Exercise: Seated March 10 x 3  Seated ball squeeze 10 x 3 Seated red band clam 10 x 3 Seated heel raises 10 x 3  Seated toe raises 10 x 3 Seated LAQ 10 x 3  Supine marching 10 x 3 Supine clams 10 x 3 Seated calf stretch with towel  Manual Therapy: Passive hamstring stretch STW to right anteriolateral thigh    [facility] Adult PT Treatment:                                                DATE: 01/17/23 Therapeutic Exercise: Seated marches x10 B LE Seated hip abduction X10 B LE BW Seated heel/toe raises x10 B LE HEP review + handout, education on breaking up throughout day, pacing/safety     PATIENT EDUCATION:  Education details: rationale for interventions, HEP update (provided w/ RTB for hip abduction) Person educated: Patient, caregiver Education method: Explanation, Demonstration, Tactile cues, Verbal cues, and  Handouts Education comprehension: verbalized understanding, returned demonstration, verbal cues required, tactile cues required, and needs further education     HOME EXERCISE PROGRAM: Access Code: [email or web address] URL: [email or web address] Date: 02/14/2023 Prepared by: Enis Slipper  Exercises - Seated March  - 1 x daily - 7 x weekly - 3 sets - 10 reps - Seated Toe Raise  - 1 x daily - 7 x weekly - 3 sets - 10 reps - Seated Hip Abduction with Resistance  - 1 x daily - 7 x weekly - 3 sets - 10 reps   ASSESSMENT:   CLINICAL IMPRESSION: Pt arrives w/o pain, notes improvement in walking/standing tolerance since starting PT but continues to have some intermittent R hip pain. Today progressed w/ increased time spent in standing, monitored vitals throughout (stable/WNL), no adverse events or increases in pain. Tolerates session quite well overall. Recommend skilled PT to address activity tolerance and LE strength. Pt departs today's session in no acute distress, all voiced questions/concerns addressed appropriately from PT perspective.  Eval: Pt endorses trouble with R hip since hospitalization last autumn, feels she is essentially back to functional baseline although R LE weakness persists. Pt demos generalized weakness more pronounced on RLE, difficulty w/ transfers, and reduced activity on this date. Concordant pain elicited with palpation of R glutes/QL. Tolerates HEP well without issue, education on pacing and performance throughout day, safety w/ activity. No adverse events, tolerates examination well. Pt and son verbalize agreement with plan as below. Recommend skilled PT to address aforementioned deficits with aim to maximize functional tolerance and mitigate functional decline. Pt departs today's session in no acute distress, all voiced questions/concerns addressed appropriately from PT perspective.    OBJECTIVE IMPAIRMENTS: Abnormal gait, decreased activity tolerance, decreased  endurance, decreased mobility, difficulty walking, decreased ROM, decreased strength, improper body mechanics, postural dysfunction, and pain.    ACTIVITY LIMITATIONS: carrying, lifting, bending, standing, squatting, stairs, transfers, bathing, and locomotion level   PARTICIPATION LIMITATIONS: meal prep, cleaning, laundry, driving, and community activity   PERSONAL FACTORS: Age, Time since onset of injury/illness/exacerbation, and 3+ comorbidities: DM, essential tremor, CVA, osteopenia, cardiac issues  are also affecting patient's functional outcome.    REHAB POTENTIAL: Fair given comorbidities, age, and overall mobility level   CLINICAL DECISION MAKING: Stable/uncomplicated   EVALUATION COMPLEXITY: Low     GOALS: Goals reviewed with patient? No   SHORT TERM GOALS: Target date: 02/07/2023 Pt will demonstrate appropriate understanding and performance of initially prescribed HEP in order to facilitate improved independence with management of symptoms.  Baseline: HEP provided on eval Goal status: INITIAL   LONG TERM GOALS: Target date: 02/28/2023   1.  Pt will demonstrate knee extension AROM that is within 3 degrees of symmetrical in order to facilitate improved tolerance to functional movements such as standing/walking.  Baseline: see ROM chart above Goal status: INITIAL   2.  Pt will demonstrate grossly symmetrical hip/knee MMT for improved strength with functional mobility. Baseline: see MMT chart above Goal status: INITIAL   3.  Pt/caregiver will demonstrate independence with final prescribed HEP to facilitate improved self efficacy with management of symptoms. Baseline: Initial HEP provided Goal status: INITIAL    4. Pt will be able to perform STS transfer mod I with unilat UE support on W/C and unilat UE support on LRAD in order to maximize functional strength/tolerance for transfers.                       Baseline: see above for mechanics                       Goal status:  INITIAL      PLAN:   PT FREQUENCY: 1x/week   PT DURATION: 6 weeks   PLANNED INTERVENTIONS: Therapeutic exercises, Therapeutic activity, Neuromuscular re-education, Balance training, Gait training, Patient/Family education, Self Care, Joint mobilization, Stair training, Cryotherapy, Moist heat, Taping, Manual therapy, and Re-evaluation   PLAN FOR NEXT SESSION: General LE AROM, consider functional mobility (STS transfers w/ RW) but monitor vitals as pt endorses SOB with functional mobility at home      Leeroy Cha PT, DPT 02/14/2023 2:51 PM

## 2023-02-14 ENCOUNTER — Encounter: Payer: Self-pay | Admitting: Physical Therapy

## 2023-02-14 ENCOUNTER — Ambulatory Visit: Payer: Medicare Other | Admitting: Physical Therapy

## 2023-02-14 DIAGNOSIS — M25551 Pain in right hip: Secondary | ICD-10-CM

## 2023-02-14 DIAGNOSIS — M5459 Other low back pain: Secondary | ICD-10-CM

## 2023-02-14 DIAGNOSIS — M6281 Muscle weakness (generalized): Secondary | ICD-10-CM

## 2023-02-19 NOTE — Therapy (Signed)
OUTPATIENT PHYSICAL THERAPY TREATMENT NOTE   Patient Name: [name] [name] MRN: [id number] DOB:Dec 01, 1929, 89 y.o., female Today's Date: 02/20/2023  PCP: Zola Button, MD   REFERRING PROVIDER: McDiarmid, Blane Ohara, MD  END OF SESSION:   PT End of Session - 02/20/23 1327     Visit Number 4    Number of Visits 7    Date for PT Re-Evaluation 03/14/23    Authorization Type Medicare + MCD secondary    Authorization Time Period no auth    Progress Note Due on Visit 10    PT Start Time 1329    PT Stop Time 1408    PT Time Calculation (min) 39 min    Activity Tolerance Patient tolerated treatment well;No increased pain    Behavior During Therapy [facility] for tasks assessed/performed             Past Medical History:  Diagnosis Date   Anxiety    Diabetes ([place]) 10/27/2013   DVT (deep venous thrombosis) ([place])    Essential tremor 10/27/2013   High cholesterol 1999   Hypertension    Ischemic stroke ([place]) 07/18/2014   Obesity, unspecified 10/27/2013   Pneumonia    Unspecified hereditary and idiopathic peripheral neuropathy 10/27/2013   Past Surgical History:  Procedure Laterality Date   BIOPSY  09/26/2022   Procedure: BIOPSY;  Surgeon: Jackquline Denmark, MD;  Location: [place];  Service: Gastroenterology;;   CATARACT EXTRACTION Bilateral    ESOPHAGOGASTRODUODENOSCOPY (EGD) WITH PROPOFOL N/A 09/26/2022   Procedure: ESOPHAGOGASTRODUODENOSCOPY (EGD) WITH PROPOFOL;  Surgeon: Jackquline Denmark, MD;  Location: [place];  Service: Gastroenterology;  Laterality: N/A;   FOOT SURGERY     IR KYPHO LUMBAR INC FX REDUCE BONE BX UNI/BIL CANNULATION INC/IMAGING  06/15/2019   Patient Active Problem List   Diagnosis Date Noted   Right leg pain 11/29/2022   Debility 10/04/2022   Bilateral pleural effusion 10/02/2022   Superficial venous thrombosis of right upper extremity 09/29/2022   Gastric ulcer 09/28/2022   Bilateral hip pain with RLE DVT 09/17/2022   Atrial fibrillation ([place]) 09/16/2022    Chronic pain of left knee 07/26/2022   Urinary incontinence 12/06/2020   CHF (congestive heart failure), NYHA class III, chronic, diastolic ([place]) [id number]   Osteopenia of multiple sites 11/10/2018   Back pain 10/10/2016   Seizures ([place]) 08/06/2014   History of CVA (cerebrovascular accident) 08/05/2014   HTN (hypertension) 07/18/2014   HLD (hyperlipidemia) 07/18/2014   Essential tremor 10/27/2013   Overweight 10/27/2013   History of peripheral neuropathy 10/27/2013    REFERRING DIAG: M25.551 (ICD-10-CM) - Right hip pain  THERAPY DIAG:  Pain in right hip  Other low back pain  Muscle weakness (generalized)  Rationale for Evaluation and Treatment Rehabilitation  PERTINENT HISTORY: DM, DVT, essential tremor, HTN, ischemic stroke, peripheral neuropathy, CHF, osteopenia  PRECAUTIONS: fall risk, R thigh wound, cardiac hx, seizure, osteopenia  SUBJECTIVE:  SUBJECTIVE STATEMENT:   Pt states her hip continues to bother her while standing. Appreciate assistance of son with interpreting.    PAIN:  Are you having pain: none Location/description: numbness/stiffness Best-worst over past week: 0-5/10  Per eval -  - aggravating factors: walking, standing  - Easing factors: sitting rest breaks    OBJECTIVE: (objective measures completed at initial evaluation unless otherwise dated)   NEXT MD VISIT: March    OBJECTIVE:    DIAGNOSTIC FINDINGS:  MRI R Femur 12/02/22 IMPRESSION: 1. The wound along the posterolateral distal thigh just above the knee demonstrates cutaneous enhancement and potentially some minimal adjacent subcutaneous enhancement. No underlying abscess or osteomyelitis. 2. Small right knee joint effusion with mild osteoarthritis and chondrocalcinosis of the menisci suggesting CPPD  arthropathy. No findings of substantial synovitis or septic joint.     PATIENT SURVEYS:  Deferred given time constraints and language barrier   COGNITION: Overall cognitive status: assessment confounded by language barrier but does appear conversationally appropriate in communication w/ son, pleasant and agreeable throughout            SENSATION: Light touch intact but reduced along entirety of RLE compared to L - states this is chronic, son thinks it may be attributable to previous stroke   POSTURE: kyphosis present in both sitting and standing   PALPATION: Concordant tenderness R proximal glute and distal QL   LOWER EXTREMITY ROM:      Active  Right eval Left eval  Hip flexion >90 deg against gravity >90 deg against gravity  Hip extension      Hip internal rotation      Hip external rotation      Knee extension -10 against gravity - 5 against gravity  Knee flexion >90deg grossly  >90 deg grossly  (Blank rows = not tested) (Key: WFL = within functional limits not formally assessed, * = concordant pain, s = stiffness/stretching sensation, NT = not tested)  Comments: majority of ROM performed in chair d/t mobility deficits    LOWER EXTREMITY MMT:     MMT Right eval Left eval  Hip flexion 3+ 4+  Hip abduction (modified sitting) 4+ 4+  Hip internal rotation      Hip external rotation      Knee flexion 4 4  Knee extension 3- 3+  Ankle dorsiflexion 4 4    (Blank rows = not tested) (Key: WFL = within functional limits not formally assessed, * = concordant pain, s = stiffness/stretching sensation, NT = not tested)  Comments: above painless     FUNCTIONAL TESTS:  Sit to stand from wheelchair: RW, SBA. Inc time and effort, heavy B UE support from armrests. Knee extension>hip ext>trunk ext.     GAIT: Distance walked: <3ft Assistive device utilized: Walker - 2 wheeled Level of assistance: SBA Comments: shuffling steps, fwd flexed posture, reduced hip extension      TODAY'S TREATMENT:                                                                                               [facility] Adult PT Treatment:  DATE: 02/20/23 Pre standing vitals HR 58-61bpm SpO2 99% RA Therapeutic Exercise: Seated adductor iso 2x15 cues for pacing GTB seated hip abd 2x10 cues for form and posture  Seated marches 2# ankle weight BIL 2x10 cues for form and pacing Standing marches RW CGA x8 BIL cues for pacing Standing hip ext RW CGA 2x5 BIL cues for posture and pacing  Standing heel raises 2x8 cues for form and pacing, RW CGA LAQ x15 BIL LE cues for velocity  Seated heel/toe raises x15 cues for velocity  Vitals w/ activity: HR 58-76bpm, SpO2 94-99% RA. Monitored during and after activity   [facility] Adult PT Treatment:                                                DATE: 02/14/23 Pre-standing vitals: HR 58-61 bpm, SpO2 98% RA, BP 107/59 Therapeutic Exercise: Seated adductor iso 2x15  Seated RTB abduction 2x15 Standing marches with RW 3x6 BIL LE with seated rest break between, CGA for safety Seated LAQ 2x15 cues for velocity  Standing heel raises w/ RW support CGA, 3x5 cues for form and pacing  Seated swiss ball press down, 2x8, cues for breath control and posture, appropriate force output STS x6 throughout session (transfers w/ standing exercises) RW CGA from wheelchair, tactile cues for improved trunk ext  Vitals throughout: HR 58-72bpm, SpO2 95-99% RA                                  [facility] Adult PT Treatment:                                                DATE: 02/06/23 Therapeutic Exercise: Seated March 10 x 3  Seated ball squeeze 10 x 3 Seated red band clam 10 x 3 Seated heel raises 10 x 3  Seated toe raises 10 x 3 Seated LAQ 10 x 3  Supine marching 10 x 3 Supine clams 10 x 3 Seated calf stretch with towel  Manual Therapy: Passive hamstring stretch STW to right anteriolateral thigh    [facility] Adult PT Treatment:                                                 DATE: 01/17/23 Therapeutic Exercise: Seated marches x10 B LE Seated hip abduction X10 B LE BW Seated heel/toe raises x10 B LE HEP review + handout, education on breaking up throughout day, pacing/safety     PATIENT EDUCATION:  Education details: rationale for interventions, HEP update (provided w/ RTB for hip abduction) Person educated: Patient, caregiver Education method: Explanation, Demonstration, Tactile cues, Verbal cues, and Handouts Education comprehension: verbalized understanding, returned demonstration, verbal cues required, tactile cues required, and needs further education     HOME EXERCISE PROGRAM: Access Code: [email or web address] URL: [email or web address] Date: 02/14/2023 Prepared by: Enis Slipper  Exercises - Seated March  - 1 x daily - 7 x weekly - 3 sets - 10 reps - Seated Toe Raise  - 1 x daily - 7 x weekly - 3  sets - 10 reps - Seated Hip Abduction with Resistance  - 1 x daily - 7 x weekly - 3 sets - 10 reps   ASSESSMENT:   CLINICAL IMPRESSION: 02/20/2023 Pt arrives w/o pain, continues to note pain in standing that is about the same as last visit. Continued to progress for volume and increased time in standing which pt tolerates well overall, HR/SpO2 monitored and stable throughout. Mild exertional SOB with increased repetition in standing but WNL, no adverse symptoms and vitals remain appropriate within range above. Pt denies any increase in pain throughout. Recommend continuing along POC with aim of maximizing functional independence/tolerance. Pt departs today's session in no acute distress, all voiced questions/concerns addressed appropriately from PT perspective.    Eval: Pt endorses trouble with R hip since hospitalization last autumn, feels she is essentially back to functional baseline although R LE weakness persists. Pt demos generalized weakness more pronounced on RLE, difficulty w/ transfers, and reduced activity on  this date. Concordant pain elicited with palpation of R glutes/QL. Tolerates HEP well without issue, education on pacing and performance throughout day, safety w/ activity. No adverse events, tolerates examination well. Pt and son verbalize agreement with plan as below. Recommend skilled PT to address aforementioned deficits with aim to maximize functional tolerance and mitigate functional decline. Pt departs today's session in no acute distress, all voiced questions/concerns addressed appropriately from PT perspective.    OBJECTIVE IMPAIRMENTS: Abnormal gait, decreased activity tolerance, decreased endurance, decreased mobility, difficulty walking, decreased ROM, decreased strength, improper body mechanics, postural dysfunction, and pain.    ACTIVITY LIMITATIONS: carrying, lifting, bending, standing, squatting, stairs, transfers, bathing, and locomotion level   PARTICIPATION LIMITATIONS: meal prep, cleaning, laundry, driving, and community activity   PERSONAL FACTORS: Age, Time since onset of injury/illness/exacerbation, and 3+ comorbidities: DM, essential tremor, CVA, osteopenia, cardiac issues  are also affecting patient's functional outcome.    REHAB POTENTIAL: Fair given comorbidities, age, and overall mobility level   CLINICAL DECISION MAKING: Stable/uncomplicated   EVALUATION COMPLEXITY: Low     GOALS: Goals reviewed with patient? No   SHORT TERM GOALS: Target date: 02/07/2023 Pt will demonstrate appropriate understanding and performance of initially prescribed HEP in order to facilitate improved independence with management of symptoms.  Baseline: HEP provided on eval 02/20/23: reports good adherence w/ home program Goal status: MET   LONG TERM GOALS: Target date: 02/28/2023   1.  Pt will demonstrate knee extension AROM that is within 3 degrees of symmetrical in order to facilitate improved tolerance to functional movements such as standing/walking.  Baseline: see ROM chart  above Goal status: INITIAL   2.  Pt will demonstrate grossly symmetrical hip/knee MMT for improved strength with functional mobility. Baseline: see MMT chart above Goal status: INITIAL   3.  Pt/caregiver will demonstrate independence with final prescribed HEP to facilitate improved self efficacy with management of symptoms. Baseline: Initial HEP provided Goal status: INITIAL    4. Pt will be able to perform STS transfer mod I with unilat UE support on W/C and unilat UE support on LRAD in order to maximize functional strength/tolerance for transfers.                       Baseline: see above for mechanics                       Goal status: INITIAL      PLAN:   PT FREQUENCY: 1x/week  PT DURATION: 6 weeks   PLANNED INTERVENTIONS: Therapeutic exercises, Therapeutic activity, Neuromuscular re-education, Balance training, Gait training, Patient/Family education, Self Care, Joint mobilization, Stair training, Cryotherapy, Moist heat, Taping, Manual therapy, and Re-evaluation   PLAN FOR NEXT SESSION: General LE AROM, consider functional mobility (STS transfers w/ RW) but monitor vitals as pt endorses SOB with functional mobility at home       Leeroy Cha PT, DPT 02/20/2023 5:13 PM

## 2023-02-20 ENCOUNTER — Encounter: Payer: Self-pay | Admitting: Physical Therapy

## 2023-02-20 ENCOUNTER — Ambulatory Visit: Payer: Medicare Other | Admitting: Physical Therapy

## 2023-02-20 DIAGNOSIS — M6281 Muscle weakness (generalized): Secondary | ICD-10-CM

## 2023-02-20 DIAGNOSIS — M5459 Other low back pain: Secondary | ICD-10-CM

## 2023-02-20 DIAGNOSIS — M25551 Pain in right hip: Secondary | ICD-10-CM | POA: Diagnosis not present

## 2023-02-27 NOTE — Therapy (Addendum)
OUTPATIENT PHYSICAL THERAPY TREATMENT NOTE + NO VISIT DISCHARGE (see below)   Patient Name: [name] [name] MRN: [id number] DOB:[birth year], 89 y.o., female Today's Date: 02/28/2023  PCP: [name], MD   REFERRING PROVIDER: McDiarmid, Leighton Roach, MD  END OF SESSION:   PT End of Session - 02/28/23 1317     Visit Number 5    Number of Visits 7    Date for PT Re-Evaluation 03/14/23    Authorization Type Medicare + MCD secondary    Authorization Time Period no auth    Progress Note Due on Visit 10    PT Start Time 1317    PT Stop Time 1358    PT Time Calculation (min) 41 min    Activity Tolerance Patient tolerated treatment well;No increased pain    Behavior During Therapy [facility] for tasks assessed/performed              Past Medical History:  Diagnosis Date   Anxiety    Diabetes 10/27/2013   DVT (deep venous thrombosis)    Essential tremor 10/27/2013   High cholesterol 1999   Hypertension    Ischemic stroke 07/18/2014   Obesity, unspecified 10/27/2013   Pneumonia    Unspecified hereditary and idiopathic peripheral neuropathy 10/27/2013   Past Surgical History:  Procedure Laterality Date   BIOPSY  09/26/2022   Procedure: BIOPSY;  Surgeon: Lynann Bologna, MD;  Location: [facility] ENDOSCOPY;  Service: Gastroenterology;;   CATARACT EXTRACTION Bilateral    ESOPHAGOGASTRODUODENOSCOPY (EGD) WITH PROPOFOL N/A 09/26/2022   Procedure: ESOPHAGOGASTRODUODENOSCOPY (EGD) WITH PROPOFOL;  Surgeon: Lynann Bologna, MD;  Location: [facility] ENDOSCOPY;  Service: Gastroenterology;  Laterality: N/A;   FOOT SURGERY     IR KYPHO LUMBAR INC FX REDUCE BONE BX UNI/BIL CANNULATION INC/IMAGING  06/15/2019   Patient Active Problem List   Diagnosis Date Noted   Right leg pain 11/29/2022   Debility 10/04/2022   Bilateral pleural effusion 10/02/2022   Superficial venous thrombosis of right upper extremity 09/29/2022   Gastric ulcer 09/28/2022   Bilateral hip pain with RLE DVT 09/17/2022   Atrial fibrillation  09/16/2022   Chronic pain of left knee 07/26/2022   Urinary incontinence 12/06/2020   CHF (congestive heart failure), NYHA class III, chronic, diastolic 11/11/2018   Osteopenia of multiple sites 11/10/2018   Back pain 10/10/2016   Seizures 08/06/2014   History of CVA (cerebrovascular accident) 08/05/2014   HTN (hypertension) 07/18/2014   HLD (hyperlipidemia) 07/18/2014   Essential tremor 10/27/2013   Overweight 10/27/2013   History of peripheral neuropathy 10/27/2013    REFERRING DIAG: M25.551 (ICD-10-CM) - Right hip pain  THERAPY DIAG:  Pain in right hip  Other low back pain  Muscle weakness (generalized)  Rationale for Evaluation and Treatment Rehabilitation  PERTINENT HISTORY: DM, DVT, essential tremor, HTN, ischemic stroke, peripheral neuropathy, CHF, osteopenia  PRECAUTIONS: fall risk, R thigh wound, cardiac hx, seizure, osteopenia  SUBJECTIVE:  SUBJECTIVE STATEMENT:   Pt states she is having about the same amount of back pain when standing, although she says it is improving somewhat compared to start of PT. No issues after last session, feeling well today. Son present, continue to appreciate his assistance w/ interpreting    PAIN:  Are you having pain: none Location/description: numbness/stiffness Best-worst over past week: 0-5/10  Per eval -  - aggravating factors: walking, standing  - Easing factors: sitting rest breaks    OBJECTIVE: (objective measures completed at initial evaluation unless otherwise dated)   NEXT MD VISIT: March    OBJECTIVE:    DIAGNOSTIC FINDINGS:  MRI R Femur 12/02/22 IMPRESSION: 1. The wound along the posterolateral distal thigh just above the knee demonstrates cutaneous enhancement and potentially some minimal adjacent subcutaneous enhancement. No  underlying abscess or osteomyelitis. 2. Small right knee joint effusion with mild osteoarthritis and chondrocalcinosis of the menisci suggesting CPPD arthropathy. No findings of substantial synovitis or septic joint.     PATIENT SURVEYS:  Deferred given time constraints and language barrier   COGNITION: Overall cognitive status: assessment confounded by language barrier but does appear conversationally appropriate in communication w/ son, pleasant and agreeable throughout            SENSATION: Light touch intact but reduced along entirety of RLE compared to L - states this is chronic, son thinks it may be attributable to previous stroke   POSTURE: kyphosis present in both sitting and standing   PALPATION: Concordant tenderness R proximal glute and distal QL   LOWER EXTREMITY ROM:      Active  Right eval Left eval  Hip flexion >90 deg against gravity >90 deg against gravity  Hip extension      Hip internal rotation      Hip external rotation      Knee extension -10 against gravity - 5 against gravity  Knee flexion >90deg grossly  >90 deg grossly  (Blank rows = not tested) (Key: WFL = within functional limits not formally assessed, * = concordant pain, s = stiffness/stretching sensation, NT = not tested)  Comments: majority of ROM performed in chair d/t mobility deficits    LOWER EXTREMITY MMT:     MMT Right eval Left eval  Hip flexion 3+ 4+  Hip abduction (modified sitting) 4+ 4+  Hip internal rotation      Hip external rotation      Knee flexion 4 4  Knee extension 3- 3+  Ankle dorsiflexion 4 4    (Blank rows = not tested) (Key: WFL = within functional limits not formally assessed, * = concordant pain, s = stiffness/stretching sensation, NT = not tested)  Comments: above painless     FUNCTIONAL TESTS:  Sit to stand from wheelchair: RW, SBA. Inc time and effort, heavy B UE support from armrests. Knee extension>hip ext>trunk ext.     GAIT: Distance walked:  <33ft Assistive device utilized: Walker - 2 wheeled Level of assistance: SBA Comments: shuffling steps, fwd flexed posture, reduced hip extension     TODAY'S TREATMENT:  [facility] Adult PT Treatment:                                                DATE: 02/28/23 Therapeutic Exercise: Seated adductor iso 2x15 Seated GTB abduction 2x12 cues for pacing  Seated marches 2# 2x12 cues for pacing Seated LAQ 2# 2x10 BIL cues for pacing and reduced compensations   Therapeutic Activity: Standing marches x5 BIL w walker and CGA - for posture/standing tolerance Gait 2x42ft CGA RW and W/C follow, for improved activity tolerance, seated rest and monitoring vitals Standing knee flex/hip ext x5 BIL w/ RW and CGA for posture and standing tolerance  Pre activity vitals: HR 55-57bpm SpO2 95-98% Activity throughout session: HR 61-67bpm SpO2 93-95%  [facility] Adult PT Treatment:                                                DATE: 02/20/23 Pre standing vitals HR 58-61bpm SpO2 99% RA Therapeutic Exercise: Seated adductor iso 2x15 cues for pacing GTB seated hip abd 2x10 cues for form and posture  Seated marches 2# ankle weight BIL 2x10 cues for form and pacing Standing marches RW CGA x8 BIL cues for pacing Standing hip ext RW CGA 2x5 BIL cues for posture and pacing  Standing heel raises 2x8 cues for form and pacing, RW CGA LAQ x15 BIL LE cues for velocity  Seated heel/toe raises x15 cues for velocity  Vitals w/ activity: HR 58-76bpm, SpO2 94-99% RA. Monitored during and after activity   [facility] Adult PT Treatment:                                                DATE: 02/14/23 Pre-standing vitals: HR 58-61 bpm, SpO2 98% RA, BP 107/59 Therapeutic Exercise: Seated adductor iso 2x15  Seated RTB abduction 2x15 Standing marches with RW 3x6 BIL LE with seated rest break between, CGA for safety Seated LAQ 2x15 cues for velocity   Standing heel raises w/ RW support CGA, 3x5 cues for form and pacing  Seated swiss ball press down, 2x8, cues for breath control and posture, appropriate force output STS x6 throughout session (transfers w/ standing exercises) RW CGA from wheelchair, tactile cues for improved trunk ext  Vitals throughout: HR 58-72bpm, SpO2 95-99% RA                                  [facility] Adult PT Treatment:                                                DATE: 02/06/23 Therapeutic Exercise: Seated March 10 x 3  Seated ball squeeze 10 x 3 Seated red band clam 10 x 3 Seated heel raises 10 x 3  Seated toe raises 10 x 3 Seated LAQ 10 x 3  Supine marching 10 x 3 Supine clams 10 x 3 Seated calf stretch with towel  Manual  Therapy: Passive hamstring stretch STW to right anteriolateral thigh    [facility] Adult PT Treatment:                                                DATE: 01/17/23 Therapeutic Exercise: Seated marches x10 B LE Seated hip abduction X10 B LE BW Seated heel/toe raises x10 B LE HEP review + handout, education on breaking up throughout day, pacing/safety     PATIENT EDUCATION:  Education details: rationale for interventions, HEP update (provided w/ RTB for hip abduction) Person educated: Patient, caregiver Education method: Explanation, Demonstration, Tactile cues, Verbal cues, and Handouts Education comprehension: verbalized understanding, returned demonstration, verbal cues required, tactile cues required, and needs further education     HOME EXERCISE PROGRAM: Access Code: [email or web address] URL: [email or web address] Date: 02/14/2023 Prepared by: Fransisco Hertz  Exercises - Seated March  - 1 x daily - 7 x weekly - 3 sets - 10 reps - Seated Toe Raise  - 1 x daily - 7 x weekly - 3 sets - 10 reps - Seated Hip Abduction with Resistance  - 1 x daily - 7 x weekly - 3 sets - 10 reps   ASSESSMENT:   CLINICAL IMPRESSION: 02/28/2023 Pt arrives w/ report of continued back pain while  standing, states it continues to be about the same as usual although improving slowly. Felt good after last session. Today progressed reps/resistance for familiar seated program which pt tolerates well, addition of short distance gait primarily for activity tolerance, CGA w RW and w/c follow. Continues with exertional SOB in standing comparable to previous session and consistent w/ report of baseline at home, vitals checked throughout and stable as above, appropriate HR response to activity. No adverse events, pt denies any increase in pain during session. Recommend continuing to progress as able over next couple sessions, and assess progress at end of current POC for extension of care vs discharge. Pt departs today's session in no acute distress, all voiced questions/concerns addressed appropriately from PT perspective.    Eval: Pt endorses trouble with R hip since hospitalization last autumn, feels she is essentially back to functional baseline although R LE weakness persists. Pt demos generalized weakness more pronounced on RLE, difficulty w/ transfers, and reduced activity on this date. Concordant pain elicited with palpation of R glutes/QL. Tolerates HEP well without issue, education on pacing and performance throughout day, safety w/ activity. No adverse events, tolerates examination well. Pt and son verbalize agreement with plan as below. Recommend skilled PT to address aforementioned deficits with aim to maximize functional tolerance and mitigate functional decline. Pt departs today's session in no acute distress, all voiced questions/concerns addressed appropriately from PT perspective.    OBJECTIVE IMPAIRMENTS: Abnormal gait, decreased activity tolerance, decreased endurance, decreased mobility, difficulty walking, decreased ROM, decreased strength, improper body mechanics, postural dysfunction, and pain.    ACTIVITY LIMITATIONS: carrying, lifting, bending, standing, squatting, stairs, transfers,  bathing, and locomotion level   PARTICIPATION LIMITATIONS: meal prep, cleaning, laundry, driving, and community activity   PERSONAL FACTORS: Age, Time since onset of injury/illness/exacerbation, and 3+ comorbidities: DM, essential tremor, CVA, osteopenia, cardiac issues  are also affecting patient's functional outcome.    REHAB POTENTIAL: Fair given comorbidities, age, and overall mobility level   CLINICAL DECISION MAKING: Stable/uncomplicated   EVALUATION COMPLEXITY: Low     GOALS: Goals reviewed with patient? No  SHORT TERM GOALS: Target date: 02/07/2023 Pt will demonstrate appropriate understanding and performance of initially prescribed HEP in order to facilitate improved independence with management of symptoms.  Baseline: HEP provided on eval 02/20/23: reports good adherence w/ home program Goal status: MET   LONG TERM GOALS: Target date: 02/28/2023   1.  Pt will demonstrate knee extension AROM that is within 3 degrees of symmetrical in order to facilitate improved tolerance to functional movements such as standing/walking.  Baseline: see ROM chart above Goal status: INITIAL   2.  Pt will demonstrate grossly symmetrical hip/knee MMT for improved strength with functional mobility. Baseline: see MMT chart above Goal status: INITIAL   3.  Pt/caregiver will demonstrate independence with final prescribed HEP to facilitate improved self efficacy with management of symptoms. Baseline: Initial HEP provided Goal status: INITIAL    4. Pt will be able to perform STS transfer mod I with unilat UE support on W/C and unilat UE support on LRAD in order to maximize functional strength/tolerance for transfers.                       Baseline: see above for mechanics                       Goal status: INITIAL      PLAN:   PT FREQUENCY: 1x/week   PT DURATION: 6 weeks   PLANNED INTERVENTIONS: Therapeutic exercises, Therapeutic activity, Neuromuscular re-education, Balance training, Gait  training, Patient/Family education, Self Care, Joint mobilization, Stair training, Cryotherapy, Moist heat, Taping, Manual therapy, and Re-evaluation   PLAN FOR NEXT SESSION: General LE AROM, consider functional mobility (STS transfers w/ RW) but monitor vitals as pt endorses SOB with functional mobility at home       Ashley Murrain PT, DPT 02/28/2023 2:40 PM     Addendum 05/28/23 for no visit discharge:  PHYSICAL THERAPY DISCHARGE SUMMARY  Visits from Start of Care: 5  Current functional level related to goals / functional outcomes: Unable to assess   Remaining deficits: Unable to assess   Education / Equipment: Unable to assess  Patient unable to agree to discharge due to lack of follow up.  Patient goals were  unable to be assessed . Patient is being discharged due to not returning since the last visit.   Ashley Murrain PT, DPT 05/28/2023 4:39 PM

## 2023-02-28 ENCOUNTER — Ambulatory Visit: Payer: Medicare Other | Attending: Family Medicine | Admitting: Physical Therapy

## 2023-02-28 ENCOUNTER — Encounter: Payer: Self-pay | Admitting: Physical Therapy

## 2023-02-28 DIAGNOSIS — M5459 Other low back pain: Secondary | ICD-10-CM

## 2023-02-28 DIAGNOSIS — M25551 Pain in right hip: Secondary | ICD-10-CM | POA: Diagnosis present

## 2023-02-28 DIAGNOSIS — M6281 Muscle weakness (generalized): Secondary | ICD-10-CM

## 2023-03-04 ENCOUNTER — Telehealth: Payer: Self-pay

## 2023-03-04 NOTE — Telephone Encounter (Signed)
Patients son LVM on nurse line requesting a call back to discuss his mothers medications.   I called [name] back, however no answer or option for VM.

## 2023-03-07 ENCOUNTER — Ambulatory Visit (INDEPENDENT_AMBULATORY_CARE_PROVIDER_SITE_OTHER): Payer: Medicare Other | Admitting: Family Medicine

## 2023-03-07 ENCOUNTER — Other Ambulatory Visit: Payer: Self-pay

## 2023-03-07 ENCOUNTER — Ambulatory Visit (HOSPITAL_COMMUNITY)
Admission: RE | Admit: 2023-03-07 | Discharge: 2023-03-07 | Disposition: A | Discharge status: Home / Self Care | Payer: Medicare Other | Source: Ambulatory Visit | Attending: Family Medicine | Admitting: Family Medicine

## 2023-03-07 VITALS — BP 105/68 | HR 114 | Wt 151.4 lb

## 2023-03-07 DIAGNOSIS — M25551 Pain in right hip: Secondary | ICD-10-CM | POA: Diagnosis not present

## 2023-03-07 DIAGNOSIS — E559 Vitamin D deficiency, unspecified: Secondary | ICD-10-CM | POA: Diagnosis not present

## 2023-03-07 DIAGNOSIS — D649 Anemia, unspecified: Secondary | ICD-10-CM | POA: Diagnosis not present

## 2023-03-07 DIAGNOSIS — I4891 Unspecified atrial fibrillation: Secondary | ICD-10-CM | POA: Insufficient documentation

## 2023-03-07 DIAGNOSIS — I5032 Chronic diastolic (congestive) heart failure: Secondary | ICD-10-CM

## 2023-03-07 MED ORDER — FUROSEMIDE 20 MG PO TABS: 40.0000 mg | ORAL_TABLET | Freq: Two times a day (BID) | ORAL | 1 refills | Status: DC

## 2023-03-07 MED ORDER — DIGOXIN 62.5 MCG PO TABS: 0.0625 mg | ORAL_TABLET | Freq: Every day | ORAL | 0 refills | Status: DC

## 2023-03-07 MED ORDER — VITAMIN D (ERGOCALCIFEROL) 1.25 MG (50000 UNIT) PO CAPS: 50000.0000 [IU] | ORAL_CAPSULE | ORAL | 0 refills | Status: DC

## 2023-03-07 NOTE — Patient Instructions (Addendum)
It was wonderful to see you today.  Please bring ALL of your medications with you to every visit.   Today we talked about:  I have sent a referral to [facility].  We are doing lab work today to check your anemia, electrolytes, to check for fluid overload. I will send you a MyChart message if you have MyChart. Otherwise, I will give you a call for abnormal results or send a letter if everything returned back normal. If you don't hear from me in 2 weeks, please call the office.     I am sending a fluid pill called Lasix 40 mg. Take it at 8AM and 2PM I will give your granddaughter a call with the results.  I will see you again on Wednesday, we can discuss any medication adjustments at that time.   I have sent a refill on the Digoxin. I have sent in prescription Vitamin D that she can take once a week.   Thank you for coming to your visit as scheduled. We have had a large "no-show" problem lately, and this significantly limits our ability to see and care for patients. As a friendly reminder- if you cannot make your appointment please call to cancel. We do have a no show policy for those who do not cancel within 24 hours. Our policy is that if you miss or fail to cancel an appointment within 24 hours, 3 times in a 61-month period, you may be dismissed from our clinic.   Thank you for choosing [facility].   Please call [phone] with any questions about today's appointment.  Please be sure to schedule follow up at the front  desk before you leave today.   Sabino Dick, DO PGY-3 Family Medicine

## 2023-03-07 NOTE — Progress Notes (Signed)
SUBJECTIVE:   CHIEF COMPLAINT / HPI:   [name] [name] is a 89 y.o. female who presents to the [facility] clinic today accompanied by her son (with granddaughter on the telephone line) to discuss the following concerns:   Dyspnea, cough  Ms. Desir's granddaughter, Dr. Rowe Pavy, tells me that her grandmother was hospitalized in October for afib with RVR, pulmonary edema.  She underwent a cardioversion and subsequently was in sinus rhythm.  She was discharged on digoxin, amiodarone, diltiazem, metoprolol.  She is was also prescribed torsemide 10 mg to take 3 times weekly.  For unclear reasons, it appears that her torsemide was stopped about a month ago.  For the last week she has felt increasingly short of breath.  She started to have symptoms that are similar to her last hospitalization.  She has had a cough and "shaking in the chest".  Seem to worsen on Tuesday, 3 days ago.  I checked a home pulse ox which was good but her heart rate was elevated.  Her granddaughter advised her to take an extra dose of metoprolol and digoxin.  They had unsuccessful attempts at trying to reach her cardiologist.  Because of this, her granddaughter did prescribe her a short course of furosemide 20 mg.  She took this at night and doubled her dose to 40 mg in the morning. She took 40 mg this morning.  She has had significant urinary output with this.  Her symptoms have improved.  She reports feeling better for the last couple days.  She is no longer coughing.  She endorses DOE but no chest pain. Has also been using 2L O2 at night, they report the oxygen is left over from her hospital admission. She has been using two pillows at night which is standard for her. No PND now but did have some on Tuesday. No palpitations. No leg edema- but she typically does not carry extra weight in her legs.   Per chart review, appears that her dry weight is closer to 145 lbs.  PERTINENT  PMH / PSH: Hypertension, CHF, A-fib s/p DCCV on  apixaban, bilateral pleural effusions, HLD, seizures, urinary incontinence, GIB  OBJECTIVE:   BP 105/68   Pulse (!) 114   Wt 151 lb 6.4 oz (68.7 kg)   SpO2 98%   BMI 29.57 kg/m    General: Elderly appearing, NAD, pleasant, able to participate in exam HEENT: Normocephalic, JVD present with compression of RUQ, 1cm Cardiac: RRR Respiratory: CTAB, normal effort, No wheezes, rales or rhonchi Abdomen:  Nondistended, soft  Extremities: resting tremor b/l hands, no lower extremity edema  Skin: warm and dry Psych: Normal affect and mood  ASSESSMENT/PLAN:   1. CHF (congestive heart failure), NYHA class III, chronic, diastolic Reassuringly appears improved today after she was restarted on Lasix.  Does not appear overtly decompensated at this time.  Lungs clear, heart rate not tachycardic on auscultation or EKG.  Normal SpO2 without any increased work of breathing.  Check labs and will continue diuresis over the weekend and close f/u next week. They requested a referral to a different Cardiology practice, which I have sent.  - Brain natriuretic peptide - Basic Metabolic Panel - Magnesium - Ambulatory referral to Cardiology - furosemide (LASIX) 20 MG tablet; Take 2 tablets (40 mg total) by mouth 2 (two) times daily. Take one dose at 8AM and another at [facility]  Dispense: 90 tablet; Refill: 1 - Continue potassium supplementation  - F/u scheduled with me on Wednesday (date  and time per family's preference). Will adjust Lasix at this time as appropriate.  2. Atrial fibrillation, unspecified type In sinus rhythm today. EKG with evidence of first degree AV blood, sinus bradycardia at 59 bpm though asymptomatic. Bradycardia may be a result of her extra metoprolol and digoxin dose. Requesting refill on digoxin. - Continue Metoprolol 50 mg, Digoxin 0.0625 mg (previously prescribed doses)   - EKG 12-Lead - Digoxin 62.5 MCG TABS; Take 1 tablet (0.0625 mg) by mouth daily.  Dispense: 30 tablet; Refill: 0  3.  Normocytic anemia Last Hgb in December was 10.5. She is on reduced dose of Eliquis given her age and weight. Does have history of GIB but denies any obvious bleeding at this time. Will recheck labs.  - CBC  4. Vitamin D deficiency Requesting prescription strength to reduce pill burden throughout the week.  - Vitamin D, Ergocalciferol, (DRISDOL) 1.25 MG (50000 UNIT) CAPS capsule; Take 1 capsule (50,000 Units total) by mouth every Wednesday.  Dispense: 5 capsule; Refill: 0   Sabino Dick, DO [facility]

## 2023-03-11 ENCOUNTER — Ambulatory Visit: Payer: Medicare Other | Admitting: Neurology

## 2023-03-11 LAB — CBC
Hematocrit: 35.2 % (ref 34.0–46.6)
Hemoglobin: 10.3 g/dL — ABNORMAL LOW (ref 11.1–15.9)
MCH: 22.4 pg — ABNORMAL LOW (ref 26.6–33.0)
MCHC: 29.3 g/dL — ABNORMAL LOW (ref 31.5–35.7)
MCV: 77 fL — ABNORMAL LOW (ref 79–97)
Platelets: 161 10*3/uL (ref 150–450)
RBC: 4.6 x10E6/uL (ref 3.77–5.28)
RDW: 17.4 % — ABNORMAL HIGH (ref 11.7–15.4)
WBC: 4.9 10*3/uL (ref 3.4–10.8)

## 2023-03-11 LAB — BASIC METABOLIC PANEL
BUN/Creatinine Ratio: 21 (ref 12–28)
BUN: 21 mg/dL (ref 10–36)
CO2: 26 mmol/L (ref 20–29)
Calcium: 9.3 mg/dL (ref 8.7–10.3)
Chloride: 100 mmol/L (ref 96–106)
Creatinine, Ser: 1 mg/dL (ref 0.57–1.00)
Glucose: 90 mg/dL (ref 70–99)
Potassium: 4.9 mmol/L (ref 3.5–5.2)
Sodium: 142 mmol/L (ref 134–144)
eGFR: 54 mL/min/{1.73_m2} — ABNORMAL LOW (ref >59)

## 2023-03-11 LAB — MAGNESIUM: Magnesium: 2.2 mg/dL (ref 1.6–2.3)

## 2023-03-11 LAB — BRAIN NATRIURETIC PEPTIDE: BNP: 155.8 pg/mL — ABNORMAL HIGH (ref 0.0–100.0)

## 2023-03-11 NOTE — Progress Notes (Deleted)
    SUBJECTIVE:   CHIEF COMPLAINT / HPI:   [name] [name] is a 89 y.o. female who presents to the [facility] clinic today to discuss the following concerns:   Heart Failure Follow Up Patient last seen on 4/12 for concerns regarding heart failure exacerbation. At that time, weight appeared to be about 6lbs increased from estimated dry weight (~145 lbs). Her Lasix was increased to 40 mg BID. EKG at that time did not show atrial fibrillation.   Today she presents for follow up. She is accompanied by her son who reports ***    PERTINENT  PMH / PSH: Hypertension, CHF, A-fib s/p DCCV on apixaban, bilateral pleural effusions, HLD, seizures, urinary incontinence, GIB   OBJECTIVE:   There were no vitals taken for this visit. ***  General: NAD, pleasant, able to participate in exam Cardiac: RRR, no murmurs. Respiratory: CTAB, normal effort, No wheezes, rales or rhonchi Abdomen: Bowel sounds present, nontender, nondistended, no hepatosplenomegaly. Extremities: no edema or cyanosis. Skin: warm and dry, no rashes noted Neuro: alert, no obvious focal deficits Psych: Normal affect and mood  ASSESSMENT/PLAN:   No problem-specific Assessment & Plan notes found for this encounter.     Sabino Dick, DO [facility]

## 2023-03-12 ENCOUNTER — Ambulatory Visit: Payer: Self-pay | Admitting: Family Medicine

## 2023-03-12 ENCOUNTER — Other Ambulatory Visit: Payer: Self-pay

## 2023-03-12 ENCOUNTER — Encounter: Payer: Self-pay | Admitting: Family Medicine

## 2023-03-12 ENCOUNTER — Ambulatory Visit (INDEPENDENT_AMBULATORY_CARE_PROVIDER_SITE_OTHER): Payer: Medicare Other | Admitting: Family Medicine

## 2023-03-12 VITALS — BP 102/82 | HR 53 | Ht 60.0 in | Wt 141.2 lb

## 2023-03-12 DIAGNOSIS — I5032 Chronic diastolic (congestive) heart failure: Secondary | ICD-10-CM

## 2023-03-12 DIAGNOSIS — D509 Iron deficiency anemia, unspecified: Secondary | ICD-10-CM | POA: Diagnosis not present

## 2023-03-12 MED ORDER — METOPROLOL SUCCINATE ER 25 MG PO TB24: 25.0000 mg | ORAL_TABLET | Freq: Every day | ORAL | 3 refills | Status: DC

## 2023-03-12 MED ORDER — FUROSEMIDE 20 MG PO TABS: 20.0000 mg | ORAL_TABLET | Freq: Every day | ORAL | 3 refills | Status: DC

## 2023-03-12 NOTE — Progress Notes (Signed)
    SUBJECTIVE:   CHIEF COMPLAINT / HPI:   [name] [name] is a 89 y.o. female who presents to the [facility] clinic today to discuss the following concerns:   HF Follow Up Seen last week, Lasix increased to 40 mg BID from 20 mg daily. Weight was 151 lbs at that time (dry weight appears to be around 145 lbs). Returns today with her granddaughter who translates for her. She reports cough is a little better. Still using 2L O2 at night. Still feels some slight dyspnea on exertion. Sleeping with one pillow.  Patient is hoping that we can cut back on her Lasix as it is causing her to pee often.   She also requests for a smaller more portable oxygen tank.  The 1 she currently uses is heavy for her.  She only uses oxygen at night.  PERTINENT  PMH / PSH: HTN, CHF, A-fib s/p DCCV  OBJECTIVE:   BP 102/82   Pulse (!) 54   Ht 5' (1.524 m)   SpO2 92%   BMI 29.57 kg/m    General: NAD, pleasant, sitting in wheelchair, able to participate in exam Cardiac: Bradycardic, regular rhythm Respiratory: CTAB, normal effort, No wheezes, rales or rhonchi Abdomen: Bowel sounds present, non-tender Neuro: alert, speech is clear, bilateral resting hand tremors present   ASSESSMENT/PLAN:   1. CHF (congestive heart failure), NYHA class III, chronic, diastolic Appears to be below her dry weight.  Her symptoms seem to be improved. Bradycardic today, will cut metoprolol dose back. Her a-fib does have seem to have recurred since her DCCV. Information provided to contact cardiology office for follow up.  - metoprolol succinate (TOPROL-XL) 25 MG 24 hr tablet; Take 1 tablet (25 mg total) by mouth at bedtime.  Dispense: 90 tablet; Refill: 3 - furosemide (LASIX) 20 MG tablet; Take 1 tablet (20 mg total) by mouth daily.  Dispense: 30 tablet; Refill: 3 - Continue Eliquis 2.5 mg BID  - Continue amiodarone 100 mgdaily -Continue digoxin 0.0625 mg daily,-continue diltiazem 120 mg daily - Continue potassium supplementation 20 mEq  daily  - Will inquire about portable DME oxygen as requested  2. Microcytic anemia On recent CBC, though Hgb appears to be stable. Discussed findings with patient and family. She would not want to proceed with invasive testing such as colonoscopy. Discussed that anemia may also be contributing to her dyspnea, though Hgb is not too low. Will continue to monitor for now.    Sabino Dick, DO [facility]

## 2023-03-12 NOTE — Patient Instructions (Addendum)
It was wonderful to see you today.  Please bring ALL of your medications with you to every visit.   Today we talked about:  Your weight was better today! Change your Lasix and take 20 mg once a day.  I have also decreased your Metoprolol to 25 mg daily.   [facility] Address: [address], [place], [place] [address] Phone: [phone]  Thank you for coming to your visit as scheduled. We have had a large "no-show" problem lately, and this significantly limits our ability to see and care for patients. As a friendly reminder- if you cannot make your appointment please call to cancel. We do have a no show policy for those who do not cancel within 24 hours. Our policy is that if you miss or fail to cancel an appointment within 24 hours, 3 times in a 41-month period, you may be dismissed from our clinic.   Thank you for choosing [facility].   Please call [phone] with any questions about today's appointment.  Please be sure to schedule follow up at the front  desk before you leave today.   Sabino Dick, DO PGY-3 Family Medicine

## 2023-03-13 ENCOUNTER — Ambulatory Visit: Payer: Medicare Other | Admitting: Physical Therapy

## 2023-03-17 ENCOUNTER — Telehealth: Payer: Self-pay

## 2023-03-17 ENCOUNTER — Other Ambulatory Visit: Payer: Self-pay | Admitting: Family Medicine

## 2023-03-17 DIAGNOSIS — J90 Pleural effusion, not elsewhere classified: Secondary | ICD-10-CM

## 2023-03-17 DIAGNOSIS — G4734 Idiopathic sleep related nonobstructive alveolar hypoventilation: Secondary | ICD-10-CM

## 2023-03-17 NOTE — Telephone Encounter (Signed)
Received message from Dr. Melba Coon for POC. Community message sent to [employer]. Will await response.   Veronda Prude, RN

## 2023-03-18 NOTE — Telephone Encounter (Signed)
Received notification from [employer] that patient does not have home oxygen with them.   Called son to determine which DME company they were using for home oxygen. He did not answer, LVM asking son to call back to office to discuss further.   Veronda Prude, RN

## 2023-03-19 NOTE — Telephone Encounter (Signed)
Patients son returns call to nurse line.   He reports she receive oxygen from [employer].   Order and office notes faxed to [employer].

## 2023-03-20 ENCOUNTER — Ambulatory Visit: Payer: Medicare Other | Admitting: Physical Therapy

## 2023-03-22 ENCOUNTER — Other Ambulatory Visit: Payer: Self-pay | Admitting: Neurology

## 2023-03-22 DIAGNOSIS — G250 Essential tremor: Secondary | ICD-10-CM

## 2023-03-24 ENCOUNTER — Telehealth: Payer: Self-pay

## 2023-03-24 ENCOUNTER — Other Ambulatory Visit: Payer: Self-pay | Admitting: Family Medicine

## 2023-03-24 DIAGNOSIS — I4891 Unspecified atrial fibrillation: Secondary | ICD-10-CM

## 2023-03-24 MED ORDER — DIGOXIN 125 MCG PO TABS: 0.0625 mg | ORAL_TABLET | Freq: Every day | ORAL | 0 refills | Status: DC

## 2023-03-24 NOTE — Telephone Encounter (Signed)
Patients son calls nurse line in regards to Digoxin prescription.   He reports insurance is not covering 62.5mg  tabs anymore. He reports they will cover 125mg .   Son asks we send in Digoxin 125mg  having her take 1/2.   Will forward to provider who saw patient.

## 2023-03-24 NOTE — Telephone Encounter (Signed)
Rx sent 

## 2023-03-26 NOTE — Telephone Encounter (Signed)
Son calls nurse line upset we have not sent in new prescription for Digoxin.  I advised him the medication was sent in on 4/29 as requested. He reports the pharmacy does not have prescription.  I called the pharmacy. Pharmacy reports prescription is ready for pick up.   Attempted to call son to advise, however no answer. VM left asking him to return my call.

## 2023-03-27 NOTE — Telephone Encounter (Signed)
Attempted to call pt, did not answer.

## 2023-04-11 ENCOUNTER — Telehealth: Payer: Self-pay | Admitting: Student

## 2023-04-11 NOTE — Telephone Encounter (Signed)
Called patient to schedule Medicare Annual Wellness Visit (AWV). Left message for patient to call back and schedule Medicare Annual Wellness Visit (AWV).  Last date of AWV: AWVI eligible as of 12/27/2015  Please schedule an AWVI appointment at any time with [facility] VISIT.  If any questions, please contact me at [phone].    Thank you,  [facility] Direct dial  [phone]

## 2023-04-17 ENCOUNTER — Encounter: Payer: Self-pay | Admitting: Neurology

## 2023-04-17 ENCOUNTER — Telehealth: Payer: Self-pay | Admitting: Neurology

## 2023-04-17 NOTE — Telephone Encounter (Signed)
LVM and sent letter in mail informing pt of need to reschedule 06/19/23 appt - MD out

## 2023-04-18 ENCOUNTER — Other Ambulatory Visit: Payer: Self-pay | Admitting: Family Medicine

## 2023-04-18 DIAGNOSIS — I5032 Chronic diastolic (congestive) heart failure: Secondary | ICD-10-CM

## 2023-04-20 ENCOUNTER — Other Ambulatory Visit: Payer: Self-pay | Admitting: Family Medicine

## 2023-04-20 DIAGNOSIS — E559 Vitamin D deficiency, unspecified: Secondary | ICD-10-CM

## 2023-04-25 ENCOUNTER — Encounter: Payer: Self-pay | Admitting: Family Medicine

## 2023-04-25 ENCOUNTER — Ambulatory Visit (INDEPENDENT_AMBULATORY_CARE_PROVIDER_SITE_OTHER): Payer: Medicare (Managed Care) | Admitting: Family Medicine

## 2023-04-25 VITALS — BP 110/80 | HR 70 | Ht 60.0 in | Wt 139.0 lb

## 2023-04-25 DIAGNOSIS — M545 Low back pain, unspecified: Secondary | ICD-10-CM | POA: Diagnosis not present

## 2023-04-25 DIAGNOSIS — R053 Chronic cough: Secondary | ICD-10-CM

## 2023-04-25 DIAGNOSIS — G8929 Other chronic pain: Secondary | ICD-10-CM

## 2023-04-25 DIAGNOSIS — B351 Tinea unguium: Secondary | ICD-10-CM

## 2023-04-25 MED ORDER — FLUTICASONE PROPIONATE 50 MCG/ACT NA SUSP: 1.0000 | Freq: Every day | NASAL | 12 refills | Status: AC

## 2023-04-25 MED ORDER — LIDOCAINE 5 % EX PTCH: 1.0000 | MEDICATED_PATCH | CUTANEOUS | 0 refills | Status: AC

## 2023-04-25 NOTE — Progress Notes (Signed)
    SUBJECTIVE:   CHIEF COMPLAINT / HPI:  Chief Complaint  Patient presents with   Follow-up   Cough    Here with son.  Reports left great toe pain for about 6 weeks. Nail is thick and growing the wrong way.  Dry mouth and coughing at night for the past several months. "Foam" coming out of her mouth. Cough improves if she drinks a little water.  Has felt some throat irritation, unsure about postnasal drip.  Denies shortness of breath, chest pain.  Wants refill of lidocaine patch for back pain.  This has been working well for her.  Unsure why this got discontinued.  PERTINENT  PMH / PSH: Atrial fibrillation s/p DCCV on apixaban, HFpEF, DVT   Patient Care Team: Tiffany Kocher, DO as PCP - General (Family Medicine)   OBJECTIVE:   BP 110/80   Pulse 70   Ht 5' (1.524 m)   Wt 139 lb (63 kg)   SpO2 98%   BMI 27.15 kg/m   Physical Exam Constitutional:      General: She is not in acute distress. HENT:     Head: Normocephalic and atraumatic.     Nose: Nose normal.     Mouth/Throat:     Mouth: Mucous membranes are moist.     Pharynx: Oropharynx is clear.  Eyes:     Extraocular Movements: Extraocular movements intact.  Cardiovascular:     Rate and Rhythm: Normal rate and regular rhythm.     Heart sounds: Murmur heard.     Comments: Soft systolic murmur Pulmonary:     Effort: Pulmonary effort is normal. No respiratory distress.     Breath sounds: Normal breath sounds.  Musculoskeletal:     Cervical back: Neck supple.  Skin:    Comments: Left great toe with significantly thickened and discolored nail.  Left second toe with yellow discoloration of the nail and slightly thickened nail.  Neurological:     Mental Status: She is alert.         04/25/2023   11:32 AM  Depression screen PHQ 2/9  Decreased Interest 0  Down, Depressed, Hopeless 0  PHQ - 2 Score 0  Altered sleeping 1  Tired, decreased energy 3  Change in appetite 0  Feeling bad or failure about yourself  0   Trouble concentrating 0  Moving slowly or fidgety/restless 0  Suicidal thoughts 0  PHQ-9 Score 4  Difficult doing work/chores Not difficult at all     {Show previous vital signs (optional):23777}    ASSESSMENT/PLAN:   1. Onychomycosis Significant onychomycosis on the left great toe, will send off referral to podiatry - Ambulatory referral to Podiatry  2. Chronic cough Cough affecting her at night seems to be related to mouth dryness, no problems during the day.  Could be a component of postnasal drip/UA C&S, will trial Flonase. - fluticasone (FLONASE) 50 MCG/ACT nasal spray; Place 1 spray into both nostrils daily. 1 spray in each nostril every day  Dispense: 16 g; Refill: 12  3. Chronic midline low back pain without sciatica Lidocaine patch refilled per patient request - lidocaine (LIDODERM) 5 %; Place 1 patch onto the skin daily. Remove & Discard patch within 12 hours or as directed by MD  Dispense: 30 patch; Refill: 0    Return in about 3 months (around 07/26/2023) for f/u medication management with new PCP.   [name], MD [facility]

## 2023-04-25 NOTE — Patient Instructions (Addendum)
It was nice seeing you today!  Referral to podiatry.  Try the Flonase to see if this helps with the cough.  If not helping, you can stop using this.  Stay well, [name], MD [facility] [phone]  --  Make sure to check out at the front desk before you leave today.  Please arrive at least 15 minutes prior to your scheduled appointments.  If you had blood work today, I will send you a MyChart message or a letter if results are normal. Otherwise, I will give you a call.  If you had a referral placed, they will call you to set up an appointment. Please give [place] a call if you don't hear back in the next 2 weeks.  If you need additional refills before your next appointment, please call your pharmacy first.

## 2023-04-28 ENCOUNTER — Other Ambulatory Visit (HOSPITAL_COMMUNITY): Payer: Self-pay

## 2023-04-28 ENCOUNTER — Telehealth: Payer: Self-pay

## 2023-04-28 NOTE — Telephone Encounter (Signed)
A Prior Authorization was initiated for this patients LIDODERM PATCHES through CoverMyMeds.   Key: [id number]

## 2023-04-28 NOTE — Telephone Encounter (Signed)
Prior Auth for patients medication LIDOCAINE PATCHES approved by [facility] MEDICARE from 04/28/23 until further notice.  CoverMyMeds Key: [id number] PA Case ID #: [id number]

## 2023-05-06 ENCOUNTER — Ambulatory Visit: Payer: Medicare Other | Admitting: Podiatry

## 2023-05-06 NOTE — Progress Notes (Unsigned)
Cardiology Office Note:   Date:  05/08/2023  ID:  [name] [name], DOB December 29, 1929, MRN [id number]  History of Present Illness:   [name] [name] is a 89 y.o. female anxiety, DMII, DVT, essential tremor, prior lacunar CVA, HTN, pAfib, and chronic diastolic HF who was previously followed by Dr. Algie Coffer who is now referred by Dr. Claudean Severance for further evaluation of chronic diastolic HF.  Patient admitted in 10-09/2022 where she was admitted with Afib with RVR and acute on chronic diastolic HF exacerbation. Course was complicated by GI bleed secondary to gastric ulcer. She was diuresed with improvement and she stabilized from a GI bleed standpoint and she ultimately underwent TEE/DCCV. Was discharged to rehab.  Today, the patient overall feels okay. No chest pain but has been having dyspnea on exertion which is chronic. This improves with sitting and relaxing. No LE edema, orthopnea or PND. About 1 month ago, she had an acute exacerbation in the setting of being off torsemide and she was diuresed successfully and is back to her dry weight. She is currently taking lasix 20mg  daily. Has been intermittently requiring O2 at night, but this is unchanged. We discussed her medications at length today. Goal is to keep her in rhythm and minimize other medications.   Past Medical History:  Diagnosis Date   Anxiety    Diabetes (HCC) 10/27/2013   DVT (deep venous thrombosis) (HCC)    Essential tremor 10/27/2013   High cholesterol 1999   Hypertension    Ischemic stroke (HCC) 07/18/2014   Obesity, unspecified 10/27/2013   Pneumonia    Unspecified hereditary and idiopathic peripheral neuropathy 10/27/2013     ROS: As per HPI  Studies Reviewed:    EKG:  No new tracing today  Cardiac Studies & Procedures       ECHOCARDIOGRAM  ECHOCARDIOGRAM COMPLETE 09/17/2022  Narrative ECHOCARDIOGRAM REPORT    Patient Name:   [name] [name] Date of Exam: 09/17/2022 Medical Rec #:  [id number]        Height:        60.0 in Accession #:    [id number]       Weight:       150.0 lb Date of Birth:  08-12-1930       BSA:          1.652 m Patient Age:    89 years         BP:           113/73 mmHg Patient Gender: F                HR:           104 bpm. Exam Location:  Inpatient  Procedure: 2D Echo, Cardiac Doppler and Color Doppler  Indications:     Atrial Fibrillation I48.91  History:         Patient has prior history of Echocardiogram examinations, most recent 05/25/2018. CHF, Stroke, Arrythmias:Atrial Fibrillation; Risk Factors:Hypertension and Dyslipidemia.  Sonographer:     Lucendia Herrlich Referring Phys:  [id number] Heide Scales Diagnosing Phys: Orpah Cobb MD  IMPRESSIONS   1. Left ventricular ejection fraction, by estimation, is 55 to 60%. The left ventricle has normal function. The left ventricle has no regional wall motion abnormalities. Left ventricular diastolic parameters are indeterminate. 2. Right ventricular systolic function is low normal. The right ventricular size is normal. 3. Left atrial size was mildly dilated. 4. Right atrial size was mildly dilated. 5. The mitral valve is degenerative. Mild mitral  valve regurgitation. 6. Tricuspid valve regurgitation is moderate. 7. The aortic valve is tricuspid. There is mild calcification of the aortic valve. There is mild thickening of the aortic valve. Aortic valve regurgitation is mild. 8. There is mild (Grade II) atheroma plaque involving the ascending aorta and aortic root. 9. The inferior vena cava is dilated in size with <50% respiratory variability, suggesting right atrial pressure of 15 mmHg.  FINDINGS Left Ventricle: Left ventricular ejection fraction, by estimation, is 55 to 60%. The left ventricle has normal function. The left ventricle has no regional wall motion abnormalities. The left ventricular internal cavity size was normal in size. There is borderline left ventricular hypertrophy. Left ventricular diastolic  parameters are indeterminate.  Right Ventricle: The right ventricular size is normal. No increase in right ventricular wall thickness. Right ventricular systolic function is low normal.  Left Atrium: Left atrial size was mildly dilated.  Right Atrium: Right atrial size was mildly dilated.  Pericardium: There is no evidence of pericardial effusion.  Mitral Valve: The mitral valve is degenerative in appearance. There is mild thickening of the mitral valve leaflet(s). There is mild calcification of the mitral valve leaflet(s). Mild mitral annular calcification. Mild mitral valve regurgitation.  Tricuspid Valve: The tricuspid valve is normal in structure. Tricuspid valve regurgitation is moderate.  Aortic Valve: The aortic valve is tricuspid. There is mild calcification of the aortic valve. There is mild thickening of the aortic valve. There is mild aortic valve annular calcification. Aortic valve regurgitation is mild. Aortic regurgitation PHT measures 657 msec. Aortic valve mean gradient measures 4.5 mmHg. Aortic valve peak gradient measures 8.5 mmHg. Aortic valve area, by VTI measures 1.93 cm.  Pulmonic Valve: The pulmonic valve was normal in structure. Pulmonic valve regurgitation is trivial.  Aorta: The aortic root is normal in size and structure. There is mild (Grade II) atheroma plaque involving the ascending aorta and aortic root.  Venous: The inferior vena cava is dilated in size with less than 50% respiratory variability, suggesting right atrial pressure of 15 mmHg.  IAS/Shunts: The atrial septum is grossly normal.   LEFT VENTRICLE PLAX 2D LVIDd:         3.90 cm   Diastology LVIDs:         2.80 cm   LV e' medial:   5.11 cm/s LV PW:         1.10 cm   LV E/e' medial: 21.5 LV IVS:        1.00 cm LVOT diam:     1.90 cm LV SV:         47 LV SV Index:   29 LVOT Area:     2.84 cm   RIGHT VENTRICLE            IVC RV S prime:     9.90 cm/s  IVC diam: 2.40 cm TAPSE (M-mode): 1.5  cm  LEFT ATRIUM             Index        RIGHT ATRIUM           Index LA diam:        4.00 cm 2.42 cm/m   RA Area:     14.10 cm LA Vol (A2C):   48.1 ml 29.12 ml/m  RA Volume:   32.60 ml  19.74 ml/m LA Vol (A4C):   39.4 ml 23.85 ml/m LA Biplane Vol: 44.4 ml 26.88 ml/m AORTIC VALVE AV Area (Vmax):    2.08 cm AV  Area (Vmean):   2.18 cm AV Area (VTI):     1.93 cm AV Vmax:           146.00 cm/s AV Vmean:          98.800 cm/s AV VTI:            0.244 m AV Peak Grad:      8.5 mmHg AV Mean Grad:      4.5 mmHg LVOT Vmax:         107.10 cm/s LVOT Vmean:        75.800 cm/s LVOT VTI:          0.166 m LVOT/AV VTI ratio: 0.68 AI PHT:            657 msec  AORTA Ao Root diam: 3.10 cm Ao Asc diam:  3.70 cm  MV E velocity: 110.00 cm/s  TRICUSPID VALVE TR Peak grad:   31.6 mmHg TR Vmax:        281.00 cm/s  SHUNTS Systemic VTI:  0.17 m Systemic Diam: 1.90 cm  Orpah Cobb MD Electronically signed by Orpah Cobb MD Signature Date/Time: 09/17/2022/12:20:08 PM    Final   TEE  ECHO TEE 09/24/2022  Narrative TRANSESOPHOGEAL ECHO REPORT    Patient Name:   [name] [name] Date of Exam: 09/24/2022 Medical Rec #:  [id number]        Height:       60.0 in Accession #:    [id number]       Weight:       158.5 lb Date of Birth:  03-15-1930       BSA:          1.691 m Patient Age:    89 years         BP:           111/68 mmHg Patient Gender: F                HR:           118 bpm. Exam Location:  Inpatient  Procedure: Transesophageal Echo, Cardiac Doppler and Color Doppler  Indications:     atrial fibrillation  History:         Patient has prior history of Echocardiogram examinations, most recent 09/17/2022. CHF; Risk Factors:Hypertension, Dyslipidemia and Diabetes.  Sonographer:     Delcie Roch RDCS Referring Phys:  [id number] Orpah Cobb Diagnosing Phys: Orpah Cobb MD  PROCEDURE: After discussion of the risks and benefits of a TEE, an informed consent was obtained  from the patient. The transesophogeal probe was passed without difficulty through the esophogus of the patient. Imaged were obtained with the patient in a left lateral decubitus position. Sedation performed by performing physician. Patients was under conscious sedation during this procedure., 2.5mg  of Versed. The patient developed no complications during the procedure.  IMPRESSIONS   1. Left ventricular ejection fraction, by estimation, is 55 to 60%. The left ventricle has normal function. The left ventricle has no regional wall motion abnormalities. Left ventricular diastolic parameters are indeterminate. 2. Right ventricular systolic function is low normal. The right ventricular size is normal. Mildly increased right ventricular wall thickness. 3. Left atrial size was moderately dilated. No left atrial/left atrial appendage thrombus was detected. 4. Right atrial size was mildly dilated. 5. There is no evidence of cardiac tamponade. 6. The mitral valve is degenerative. Mild mitral valve regurgitation. 7. The aortic valve is tricuspid. There is mild calcification of the aortic valve. There is  mild thickening of the aortic valve. Aortic valve regurgitation is mild. 8. Agitated saline contrast bubble study was negative, with no evidence of any interatrial shunt.  FINDINGS Left Ventricle: Left ventricular ejection fraction, by estimation, is 55 to 60%. The left ventricle has normal function. The left ventricle has no regional wall motion abnormalities. The left ventricular internal cavity size was normal in size. There is borderline concentric left ventricular hypertrophy. Left ventricular diastolic parameters are indeterminate.  Right Ventricle: The right ventricular size is normal. Mildly increased right ventricular wall thickness. Right ventricular systolic function is low normal.  Left Atrium: Left atrial size was moderately dilated. No left atrial/left atrial appendage thrombus was  detected.  Right Atrium: Right atrial size was mildly dilated.  Pericardium: Trivial pericardial effusion is present. The pericardial effusion is circumferential. There is no evidence of cardiac tamponade.  Mitral Valve: The mitral valve is degenerative in appearance. Mild mitral valve regurgitation. There is no evidence of mitral valve vegetation.  Tricuspid Valve: The tricuspid valve is normal in structure. Tricuspid valve regurgitation is mild. There is no evidence of tricuspid valve vegetation.  Aortic Valve: The aortic valve is tricuspid. There is mild calcification of the aortic valve. There is mild thickening of the aortic valve. There is mild aortic valve annular calcification. Aortic valve regurgitation is mild. There is no evidence of aortic valve vegetation.  Pulmonic Valve: The pulmonic valve was normal in structure. Pulmonic valve regurgitation is trivial. There is no evidence of pulmonic valve vegetation.  Aorta: The aortic root is normal in size and structure.  Venous: The left upper pulmonary vein, left lower pulmonary vein, right upper pulmonary vein and right lower pulmonary vein are normal. The inferior vena cava was not well visualized.  IAS/Shunts: No atrial level shunt detected by color flow Doppler. Agitated saline contrast was given intravenously to evaluate for intracardiac shunting. Agitated saline contrast bubble study was negative, with no evidence of any interatrial shunt.  Orpah Cobb MD Electronically signed by Orpah Cobb MD Signature Date/Time: 09/24/2022/9:54:04 AM    Final             Risk Assessment/Calculations:    CHA2DS2-VASc Score = 7  This indicates a 11.2% annual risk of stroke. The patient's score is based upon: CHF History: 1 HTN History: 0 Diabetes History: 0 Stroke History: 2 Vascular Disease History: 1 Age Score: 2 Gender Score: 1             Physical Exam:   VS:  BP (!) 106/50   Pulse 64   Ht 5' (1.524 m)   Wt 139  lb (63 kg)   SpO2 98%   BMI 27.15 kg/m    Wt Readings from Last 3 Encounters:  05/08/23 139 lb (63 kg)  04/25/23 139 lb (63 kg)  03/12/23 141 lb 3.2 oz (64 kg)     GEN: Well nourished, well developed in no acute distress NECK: No JVD; No carotid bruits CARDIAC: RRR, no murmurs, rubs, gallops RESPIRATORY:  Diminished but clear ABDOMEN: Soft, non-tender, non-distended EXTREMITIES:  No edema; No deformity   ASSESSMENT AND PLAN:   #Chronic Diastolic HF: -TTE with LVEF 55-60%, mild MR, moderate TR, mild AR -Currently appears euvolemic with NYHA class III symptoms -Will check BMET and BNP today -Continue lasix 20mg  daily with potassium daily -Will not add spiro/farxiga at this time in order to minimize medication burden per patient preference  #Paroxysmal Afib: -Underwent TEE/DCCV in 08/2022; currently maintaining NSR -Continue apixaban 2.5mg  BID -  Continue amiodarone 100mg  daily -Continue metop 25mg  XL daily -STOP dig and dilt  #History of GIB: -History of gastric ulcers -No signs or symptoms of GIB  #HTN: -Controlled and at goal <130/90 -Continue metop 25mg  XL daily  #HLD: -Continue lipitor 10mg  daily  #History of CVA: -Continue lipitor 10mg  daily -Not on ASA due to need from [facility]        Signed, Meriam Sprague, MD

## 2023-05-07 ENCOUNTER — Other Ambulatory Visit: Payer: Self-pay | Admitting: Student

## 2023-05-07 DIAGNOSIS — E559 Vitamin D deficiency, unspecified: Secondary | ICD-10-CM

## 2023-05-08 ENCOUNTER — Encounter: Payer: Self-pay | Admitting: Cardiology

## 2023-05-08 ENCOUNTER — Ambulatory Visit: Payer: Medicare (Managed Care) | Attending: Cardiology | Admitting: Cardiology

## 2023-05-08 VITALS — BP 106/50 | HR 64 | Ht 60.0 in | Wt 139.0 lb

## 2023-05-08 DIAGNOSIS — K257 Chronic gastric ulcer without hemorrhage or perforation: Secondary | ICD-10-CM | POA: Diagnosis not present

## 2023-05-08 DIAGNOSIS — I480 Paroxysmal atrial fibrillation: Secondary | ICD-10-CM

## 2023-05-08 DIAGNOSIS — E7800 Pure hypercholesterolemia, unspecified: Secondary | ICD-10-CM

## 2023-05-08 DIAGNOSIS — I5032 Chronic diastolic (congestive) heart failure: Secondary | ICD-10-CM

## 2023-05-08 DIAGNOSIS — I10 Essential (primary) hypertension: Secondary | ICD-10-CM | POA: Diagnosis not present

## 2023-05-08 DIAGNOSIS — Z8673 Personal history of transient ischemic attack (TIA), and cerebral infarction without residual deficits: Secondary | ICD-10-CM

## 2023-05-08 MED ORDER — METOPROLOL SUCCINATE ER 25 MG PO TB24: 25.0000 mg | ORAL_TABLET | Freq: Every day | ORAL | 3 refills | Status: DC

## 2023-05-08 MED ORDER — POTASSIUM CHLORIDE CRYS ER 20 MEQ PO TBCR: 20.0000 meq | EXTENDED_RELEASE_TABLET | Freq: Every day | ORAL | 3 refills | Status: DC

## 2023-05-08 MED ORDER — FUROSEMIDE 20 MG PO TABS: 20.0000 mg | ORAL_TABLET | Freq: Every day | ORAL | 3 refills | Status: DC

## 2023-05-08 NOTE — Patient Instructions (Addendum)
Medication Instructions:  Please stop Digoxin and Diltiazem. Please continue taking metoprolol succinate (toprol XL) 25 mg daily at bedtime Decrease Lasix to 20mg  daily. Decrease potassium chloride 20 meq daily.   *If you need a refill on your cardiac medications before your next appointment, please call your pharmacy*   Lab Work: BNP and BMET today  If you have labs (blood work) drawn today and your tests are completely normal, you will receive your results only by: MyChart Message (if you have MyChart) OR A paper copy in the mail If you have any lab test that is abnormal or we need to change your treatment, we will call you to review the results.   Follow-Up: At [facility], you and your health needs are our priority.  As part of our continuing mission to provide you with exceptional heart care, we have created designated Provider Care Teams.  These Care Teams include your primary Cardiologist (physician) and Advanced Practice Providers (APPs -  Physician Assistants and Nurse Practitioners) who all work together to provide you with the care you need, when you need it.  We recommend signing up for the patient portal called "MyChart".  Sign up information is provided on this After Visit Summary.  MyChart is used to connect with patients for Virtual Visits (Telemedicine).  Patients are able to view lab/test results, encounter notes, upcoming appointments, etc.  Non-urgent messages can be sent to your provider as well.   To learn more about what you can do with MyChart, go to [email or web address].    Your next appointment:   3 month(s)  Provider:   Dr. Izora Ribas

## 2023-05-09 ENCOUNTER — Telehealth: Payer: Self-pay | Admitting: *Deleted

## 2023-05-09 DIAGNOSIS — I5032 Chronic diastolic (congestive) heart failure: Secondary | ICD-10-CM

## 2023-05-09 DIAGNOSIS — Z79899 Other long term (current) drug therapy: Secondary | ICD-10-CM

## 2023-05-09 DIAGNOSIS — E875 Hyperkalemia: Secondary | ICD-10-CM

## 2023-05-09 LAB — PRO B NATRIURETIC PEPTIDE: NT-Pro BNP: 628 pg/mL (ref 0–738)

## 2023-05-09 LAB — BASIC METABOLIC PANEL
BUN/Creatinine Ratio: 20 (ref 12–28)
BUN: 28 mg/dL (ref 10–36)
CO2: 25 mmol/L (ref 20–29)
Calcium: 8.8 mg/dL (ref 8.7–10.3)
Chloride: 103 mmol/L (ref 96–106)
Creatinine, Ser: 1.37 mg/dL — ABNORMAL HIGH (ref 0.57–1.00)
Glucose: 115 mg/dL — ABNORMAL HIGH (ref 70–99)
Potassium: 5.7 mmol/L — ABNORMAL HIGH (ref 3.5–5.2)
Sodium: 138 mmol/L (ref 134–144)
eGFR: 37 mL/min/{1.73_m2} — ABNORMAL LOW (ref >59)

## 2023-05-09 NOTE — Telephone Encounter (Signed)
Pts husband aware that pt needs to hold lasix x 2 days  and restart this back at her current dose of lasix 20 mg po daily thereafter.  Husband is aware to proceed with bringing her in next Friday 6/21 for repeat BMET.   He is aware to stop her KDUR as advised earlier this morning when we spoke.  Pts husband verbalized understanding and agrees with this plan.  Hold note placed on the pts lasix for 2 days, in her medications.

## 2023-05-09 NOTE — Telephone Encounter (Signed)
-----   Message from Meriam Sprague, MD sent at 05/09/2023  8:10 AM EDT ----- Potassium is too high. Cr is elevated. Awaiting BNP. Can we please tell her to stop her potassium supplement completely and repeat BMET next week to ensure K is better.

## 2023-05-09 NOTE — Telephone Encounter (Signed)
The patients husband (on dpr)  has been notified of the result and verbalized understanding.  All questions (if any) were answered.  Husband is aware that he needs to remove KDUR from her medications, for she needs to stop taking this and come in for repeat BMET in one week.  Discontinued KDUR from the pts med list.   Scheduled the pt for repeat BMET in 7 days on 05/16/23.   Husband is aware that we are awaiting her BNP result to come in, and we will call them shortly when that result is complete.  Husband verbalized understanding and agrees with this plan.

## 2023-05-09 NOTE — Telephone Encounter (Signed)
-----   Message from Meriam Sprague, MD sent at 05/09/2023  9:03 AM EDT ----- Fluid levels look good. Cr is a little elevated. Would hold lasix for 2 days and then resume at 20mg  daily going forward with the planned labs next weeks.

## 2023-05-09 NOTE — Telephone Encounter (Signed)
Pts husband aware that pt needs to hold lasix x 2 days  and restart this back at her current dose of lasix 20 mg po daily thereafter.  Husband is aware to proceed with bringing her in next Friday 6/21 for repeat BMET.   He is aware to stop her KDUR as advised earlier this morning when we spoke.  Pts husband verbalized understanding and agrees with this plan.  Hold note placed on the pts lasix for 2 days, in her medications. 

## 2023-05-13 ENCOUNTER — Ambulatory Visit (INDEPENDENT_AMBULATORY_CARE_PROVIDER_SITE_OTHER): Payer: Medicare (Managed Care) | Admitting: Family Medicine

## 2023-05-13 ENCOUNTER — Encounter: Payer: Self-pay | Admitting: Family Medicine

## 2023-05-13 VITALS — BP 136/70 | HR 60 | Ht 60.0 in | Wt 133.2 lb

## 2023-05-13 DIAGNOSIS — R051 Acute cough: Secondary | ICD-10-CM

## 2023-05-13 NOTE — Progress Notes (Signed)
    SUBJECTIVE:   CHIEF COMPLAINT / HPI:   Family member interpreting for patient  Cough - Present for the last 5-7 days - Hasn't noticed any fevers - Cough is worse at night - Has CHF and Lasix was held for a few days due to elevated cr and K - Patient resumed Lasix 20mg  this morning - Still walks the same amount (walks with walker in the house) - Denies stomach pain or swelling, chest pain, shortness of breath    History of pre-diabetes - Reports they were previously diagnosed with a version of diabetes and briefly was on Metformin  - A1c since 2015 has been <6  PERTINENT  PMH / PSH: Reviewed   OBJECTIVE:   BP 136/70   Pulse 60   Ht 5' (1.524 m)   Wt 133 lb 3.2 oz (60.4 kg)   SpO2 94%   BMI 26.01 kg/m   Gen: well-appearing, NAD elderly female sitting in wheelchair in clinic CV: RRR,  no peripheral edema appreciated Pulm: crackles in LLL base, breathing comfortably on room air, diminished breath sounds throughout  ASSESSMENT/PLAN:   Cough Patient presents with <1 week of cough with no increased sputum production. Does not have any other symptoms of URI at this time and physical exam has crackles in the LLL base. At this time, seems most consistent with possible pulmonary edema from CHF as she was recently off of her Lasix - Family to call if symptoms progress - If developing URI symptoms, consider CXR for evaluation  - Continue Lasix 20mg  daily  - ER and return precautions discussed  History of Diabetes Present in the chart upon review and evaluation of healthcare maintenance gaps. Patient's A1c since 2015 has been <6 I do not feel that A1c is necessary and given her age, do not feel that ophthalmology examination is necessary.  Evelena Leyden, DO [facility]

## 2023-05-13 NOTE — Patient Instructions (Signed)
I think that she may have a little bit of fluid on her lungs right now and that restarting the lasix will help with her current cough. If she starts to get other symptoms such as fever, congestion, runny nose, headaches, then please call the office and let us know as that would be signs of infection.   If her cough does not improve or if it worsens over the next 2 days then please also call.

## 2023-05-16 ENCOUNTER — Telehealth: Payer: Self-pay | Admitting: Cardiology

## 2023-05-16 ENCOUNTER — Ambulatory Visit: Payer: Medicare (Managed Care) | Attending: Cardiology

## 2023-05-16 DIAGNOSIS — E875 Hyperkalemia: Secondary | ICD-10-CM

## 2023-05-16 DIAGNOSIS — Z79899 Other long term (current) drug therapy: Secondary | ICD-10-CM

## 2023-05-16 DIAGNOSIS — I5032 Chronic diastolic (congestive) heart failure: Secondary | ICD-10-CM

## 2023-05-16 NOTE — Telephone Encounter (Signed)
Pt came by for bloodwork. Pts son expressed that she had been coughing a lot, they went to their primary care. And were told that she had fluid in her lung. He wanted me to let his cardiologist know what was going on.

## 2023-05-16 NOTE — Telephone Encounter (Addendum)
Will send this information to Dr. Shari Prows as an Lorain Childes.   Tried placing a call to the pts family to inquire more information but no answer and no voicemail offered.    Per our front desk team, they are sending this message on behalf of the pts son requesting to make Dr. Shari Prows know that the pt saw her PCP on 6/18 for cough and PCP felt this to be related to her HF vs an URI.  Son stopped by the front desk to relay this message as he was leaving with the pt from her lab appt.  Pt had a BMET done today in our office to reassess her renal function since we decreased her lasix from last BMET result 6/14.  Pts last lab results on 6/14 revealed that her fluid levels were OK (BNP-628) but her renal function was elevated (creatinine-1.37 and K level -5.7).  We advised at that time for the pt to STOP KDUR and HOLD lasix x 2 days and then resume back at 20 mg po daily going forward and come in for the BMET today (6/21).   Pt did come in for BMET today 6/21.  Results pending.   Pt saw her PCP on 6/18 for c/o coughing at night.  Note from PCP visible in epic.  PCP felt that cough was consistent with possible pulmonary edema from CHF related to recent dose decrease of her lasix from 6/14 result.  Per PCP note the pt has now resumed back taking her lasix 20 mg po daily, after being held x 2 days as instructed.  PCP was considering ordering a chest x-ray if she started to develop URI in the next couple days.  Family was advised to call them if this occurs.  Son brought the pt into the office today to have her follow-up BMET done.  On the way out of the office the son stopped by the front desk and requested this note be passed along to Dr. Shari Prows to let her know the PCP saw her on 6/18 for cough and PCP note can be seen in epic.   Will send this message to Dr. Shari Prows to further review and advise on.   Will follow-up with the pt/family accordingly thereafter.

## 2023-05-17 LAB — BASIC METABOLIC PANEL
BUN/Creatinine Ratio: 20 (ref 12–28)
BUN: 23 mg/dL (ref 10–36)
CO2: 24 mmol/L (ref 20–29)
Calcium: 8.6 mg/dL — ABNORMAL LOW (ref 8.7–10.3)
Chloride: 101 mmol/L (ref 96–106)
Creatinine, Ser: 1.15 mg/dL — ABNORMAL HIGH (ref 0.57–1.00)
Glucose: 122 mg/dL — ABNORMAL HIGH (ref 70–99)
Potassium: 4.6 mmol/L (ref 3.5–5.2)
Sodium: 139 mmol/L (ref 134–144)
eGFR: 45 mL/min/{1.73_m2} — ABNORMAL LOW (ref >59)

## 2023-05-19 ENCOUNTER — Other Ambulatory Visit: Payer: Self-pay | Admitting: Family Medicine

## 2023-05-19 NOTE — Telephone Encounter (Signed)
[name] Sprague, MD  Loa Socks, LPN Caller: Unspecified (3 days ago,  3:20 PM) Can we get a chest xray to assess further? Her labs looked like she was a bit dry last week, but if she feels like she has excess fluid on board, she can always do lasix 20mg  twice daily for 2-3 days and then back to 20mg  daily and see how she does.   Left the pts family a voicemail to call the office back to discuss recommendations as indicated above, by Dr. Shari Prows.

## 2023-05-19 NOTE — Telephone Encounter (Signed)
Spoke with the pts son about recommendations per Dr. Shari Prows.  Per the Son, the pt is actually doing better at this time.   He states he would like to hold off on the chest x-ray for now as well as increasing the lasix 20 mg to bid dosing for 2-3 days if fluid levels increase.  He states if her condition changes within the next few days, he will call us back and have [place] order for her to get the chest x-ray done, as well as provide extra lasix dosing.   Son reports he will continue monitoring her fluid levels and report as needed.   Son states her cough  has very much improved for now and at a minimum.  Son states he will just have her continue with her current med regimen and current dosing of lasix 20 mg po daily.    Informed the pts son that I will make Dr. Shari Prows aware of this plan, and advised him to call us back if he notices a change in her condition/fluid status, so that we can advise on ordering the chest x-ray and extra lasix dosing thereafter.   The pts son verbalized understanding and agrees with this plan.

## 2023-05-20 NOTE — Telephone Encounter (Signed)
[name] [name], [name] [name] - 05/16/2023  3:20 PM [name] Sprague, MD  Sent: Mon May 19, 2023  8:52 PM  To: Loa Socks, LPN         Message  Sounds great to me. I am happy to hear that she is feeling better!

## 2023-05-28 ENCOUNTER — Other Ambulatory Visit: Payer: Self-pay | Admitting: Student

## 2023-05-28 DIAGNOSIS — I5032 Chronic diastolic (congestive) heart failure: Secondary | ICD-10-CM

## 2023-06-19 ENCOUNTER — Ambulatory Visit: Payer: Medicare Other | Admitting: Neurology

## 2023-06-29 ENCOUNTER — Other Ambulatory Visit: Payer: Self-pay | Admitting: Neurology

## 2023-06-29 DIAGNOSIS — E0842 Diabetes mellitus due to underlying condition with diabetic polyneuropathy: Secondary | ICD-10-CM

## 2023-07-03 NOTE — Telephone Encounter (Signed)
Pt's son called wanting to know when this medication will be called in for his mother. Please advise.

## 2023-07-05 ENCOUNTER — Other Ambulatory Visit: Payer: Self-pay | Admitting: Student

## 2023-07-05 DIAGNOSIS — I5032 Chronic diastolic (congestive) heart failure: Secondary | ICD-10-CM

## 2023-07-08 ENCOUNTER — Ambulatory Visit (INDEPENDENT_AMBULATORY_CARE_PROVIDER_SITE_OTHER): Payer: Medicare (Managed Care) | Admitting: Student

## 2023-07-08 ENCOUNTER — Ambulatory Visit: Admission: RE | Admit: 2023-07-08 | Payer: Medicare (Managed Care) | Source: Ambulatory Visit

## 2023-07-08 VITALS — BP 146/56 | HR 60 | Ht 60.0 in | Wt 132.4 lb

## 2023-07-08 DIAGNOSIS — R053 Chronic cough: Secondary | ICD-10-CM

## 2023-07-08 NOTE — Patient Instructions (Addendum)
It was great to see you today! Thank you for choosing [facility] for your primary care.  Today we addressed: Cough  We will start with a chest X-ray for more information Go to [address] at [facility]  I will call with the results Please return next week    If you haven't already, sign up for My Chart to have easy access to your labs results, and communication with your primary care physician. I recommend that you always bring your medications to each appointment as this makes it easy to ensure you are on the correct medications and helps [place] not miss refills when you need them. Call the clinic at [phone] if your symptoms worsen or you have any concerns. Return in about 10 days (around 07/18/2023) for Chronic Cough, GOC conversation. Please arrive 15 minutes before your appointment to ensure smooth check in process.  We appreciate your efforts in making this happen.  Thank you for allowing me to participate in your care, [name] Martinez, MD 07/08/2023, 1:53 PM PGY-3, [facility]

## 2023-07-08 NOTE — Assessment & Plan Note (Signed)
Patient has been evaluated for GERD-- no history of acid reflux, rhinosinusitis-no other symptoms that would point me to post nasal drip. No ACE inhibitors and never smoker. CXR ordered for further assessment.  The patient is 89 years old and needs goals of care discussions.  Unfortunately, she has had some weight loss as well over time.  Unsure what the cause of her cough is but family would benefit from a goals of care discussion as she moves forward under our care.  Will have her return in 10 days hopefully to see PCP or me to discuss goals of care and x-ray results. Will call about XR results when they return.

## 2023-07-08 NOTE — Progress Notes (Signed)
  SUBJECTIVE:   CHIEF COMPLAINT / HPI:   Cough:  89 yo F complains of dry cough during the day and wet cough at night ongoing for about 8 weeks, was seen here when the cough began in mid-June and has continued to worsen.   Symptoms began 1 month ago.  Coughing stresses [name] [name]'s body.  Has oxygen and has needed it a few times since the cough started.  Patient does not have a history of asthma.  Patient does not have recent travel.  Patient does not have a history of smoking.  Patient  has not previous Chest X-ray.   Presents with her son who is her caregiver.  Patient does have a history of pleural effusions and takes Lasix 20 mg daily for this.  They did call the cardiologist to instructed them to see PCP.  Not currently on ACE inhibitor.  PERTINENT  PMH / PSH:  Atrial fibrillation s/p DCCV on apixaban, HFpEF, DVT  HTN Essential tremor  Osteopenia  H/o CVA H/o peripheral neuropathy Seizures  Urinary incontinence   Patient Care Team: Tiffany Kocher, DO as PCP - General (Family Medicine) OBJECTIVE:  BP (!) 146/56   Pulse 60   Ht 5' (1.524 m)   Wt 132 lb 6.4 oz (60.1 kg)   SpO2 92%   BMI 25.86 kg/m  Physical Exam  General: Alert in no apparent distress Heart: Regular rate and rhythm with no murmurs appreciated Lungs: CTA bilaterally, no wheezing. Difficult assessment as patient is limited with mobility and was limited to a wheelchair during examination  Abdomen: no abdominal pain Skin: Warm and dry Extremities: No lower extremity edema  ASSESSMENT/PLAN:  Chronic cough Assessment & Plan: Patient has been evaluated for GERD-- no history of acid reflux, rhinosinusitis-no other symptoms that would point me to post nasal drip. No ACE inhibitors and never smoker. CXR ordered for further assessment.  The patient is 89 years old and needs goals of care discussions.  Unfortunately, she has had some weight loss as well over time.  Unsure what the cause of her cough is but family  would benefit from a goals of care discussion as she moves forward under our care.  Will have her return in 10 days hopefully to see PCP or me to discuss goals of care and x-ray results. Will call about XR results when they return.   Orders: -     DG Chest 2 View; Future  I did evaluate for volume overload but wanted to keep her at Lasix 20 mg until I can see the CXR results. Additionally, wide pulse pressure, will hold off on increasing HTN medication until further discussion next visit.   Return in about 10 days (around 07/18/2023) for Chronic Cough, GOC conversation. Alfredo Martinez, MD 07/08/2023, 2:19 PM PGY-3, [facility]

## 2023-07-14 ENCOUNTER — Encounter: Payer: Self-pay | Admitting: Student

## 2023-07-14 NOTE — Progress Notes (Signed)
CXR results reviewed independently by me and with faculty member. Left sided pleural effusion, likely need thoracentesis. Broad ddx as to cause of effusion. Need to discuss with the patient goals of care given age and comorbidities. Will discuss further at next visit with me.

## 2023-07-18 ENCOUNTER — Encounter: Payer: Self-pay | Admitting: Student

## 2023-07-18 ENCOUNTER — Ambulatory Visit (INDEPENDENT_AMBULATORY_CARE_PROVIDER_SITE_OTHER): Payer: Medicare (Managed Care) | Admitting: Student

## 2023-07-18 VITALS — BP 112/72 | HR 61 | Wt 134.0 lb

## 2023-07-18 DIAGNOSIS — R053 Chronic cough: Secondary | ICD-10-CM

## 2023-07-18 MED ORDER — ALBUTEROL SULFATE HFA 108 (90 BASE) MCG/ACT IN AERS: 2.0000 | INHALATION_SPRAY | RESPIRATORY_TRACT | 0 refills | Status: DC | PRN

## 2023-07-18 NOTE — Patient Instructions (Addendum)
It was great to see you today! Thank you for choosing [facility] for your primary care.  Today we addressed: Try inhaler to use 2 puffs every four hour as needed   If you haven't already, sign up for My Chart to have easy access to your labs results, and communication with your primary care physician. I recommend that you always bring your medications to each appointment as this makes it easy to ensure you are on the correct medications and helps [place] not miss refills when you need them. Call the clinic at [phone] if your symptoms worsen or you have any concerns.  Please arrive 15 minutes before your appointment to ensure smooth check in process.  We appreciate your efforts in making this happen.  Thank you for allowing me to participate in your care, Alfredo Martinez, MD 07/18/2023, 10:31 AM PGY-3, [facility]

## 2023-07-18 NOTE — Progress Notes (Signed)
  SUBJECTIVE:   CHIEF COMPLAINT / HPI:   CXR F/U  Cough: CXR: Left sided pleural effusion, COPD?  Is a never smoker Patient still has had several weeks of coughing that have made it difficult for her to sleep Son reports that she does have a persistent cough that has not changed over the duration Intermittently uses oxygen for long walks Would not want thoracentesis for further evaluation of pleural effusion but they do want to try medications to help mitigate her symptoms  Son in the room with the patient and providing history  PERTINENT  PMH / PSH:  Atrial fibrillation s/p DCCV on apixaban, HFpEF, DVT  HTN Essential tremor  Osteopenia  H/o CVA H/o peripheral neuropathy Seizures  Urinary incontinence     Patient Care Team: Tiffany Kocher, DO as PCP - General (Family Medicine) OBJECTIVE:  BP 112/72   Pulse 61   Wt 134 lb (60.8 kg)   SpO2 93%   BMI 26.17 kg/m  Physical Exam  General: Alert in no apparent distress; sitting in wheelchair, frail  Heart: Regular rate and rhythm with no murmurs appreciated Lungs: CTA bilaterally, no wheezing or crackles on exam Skin: Warm and dry   ASSESSMENT/PLAN:  Chronic cough Assessment & Plan: This cough does seem to be due to a small pleural effusion that the patient has of unknown etiology.  We discussed with goals of care discussion what treatment they would like to move forward with.  The son declined thoracentesis and decided that we would try to mitigate the patient's symptoms of cough and shortness of breath.  Given the slight hyperinflation in the lungs, it is reasonable to trial albuterol as discussed with the patient and son.  Try 2 puffs every 4 as needed of albuterol.  Questionable if a steroid burst would be helpful for the patient.  Continue with oxygen as needed.  Will have the patient follow-up with PCP for continued goals of care discussions.  I do not feel like the patient would be a good candidate for thoracentesis  given her age and frailty.   Other orders -     Albuterol Sulfate HFA; Inhale 2 puffs into the lungs every 4 (four) hours as needed for wheezing or shortness of breath.  Dispense: 1 each; Refill: 0    Alfredo Martinez, MD 07/18/2023, 11:30 AM PGY-3, [facility]

## 2023-07-18 NOTE — Assessment & Plan Note (Signed)
This cough does seem to be due to a small pleural effusion that the patient has of unknown etiology.  We discussed with goals of care discussion what treatment they would like to move forward with.  The son declined thoracentesis and decided that we would try to mitigate the patient's symptoms of cough and shortness of breath.  Given the slight hyperinflation in the lungs, it is reasonable to trial albuterol as discussed with the patient and son.  Try 2 puffs every 4 as needed of albuterol.  Questionable if a steroid burst would be helpful for the patient.  Continue with oxygen as needed.  Will have the patient follow-up with PCP for continued goals of care discussions.  I do not feel like the patient would be a good candidate for thoracentesis given her age and frailty.

## 2023-07-25 ENCOUNTER — Ambulatory Visit: Payer: Medicare (Managed Care) | Admitting: Student

## 2023-07-29 ENCOUNTER — Ambulatory Visit: Payer: Medicare Other | Admitting: Neurology

## 2023-08-08 ENCOUNTER — Telehealth: Payer: Self-pay

## 2023-08-08 DIAGNOSIS — R32 Unspecified urinary incontinence: Secondary | ICD-10-CM

## 2023-08-08 NOTE — Telephone Encounter (Signed)
Son calls nurse line in regards to incontinence supplies.   He reports [employer] faxed over an order form recert on 9/11.  I did not see this is PCP box.   Please let me know if you have not received order form.

## 2023-08-09 ENCOUNTER — Other Ambulatory Visit: Payer: Self-pay | Admitting: Student

## 2023-08-13 NOTE — Telephone Encounter (Signed)
Attempted to call son multiple times without success.  I see she has an apt scheduled with PCP for 9/20. She will more than likely need updated office notes for incontinence and DME use.   We can fax new order with supporting notes at that time.  Will forward to PCP.

## 2023-08-15 ENCOUNTER — Ambulatory Visit (INDEPENDENT_AMBULATORY_CARE_PROVIDER_SITE_OTHER): Payer: Medicare (Managed Care) | Admitting: Student

## 2023-08-15 ENCOUNTER — Encounter: Payer: Self-pay | Admitting: Student

## 2023-08-15 VITALS — BP 108/59 | HR 54 | Temp 98.0°F | Wt 134.4 lb

## 2023-08-15 DIAGNOSIS — R053 Chronic cough: Secondary | ICD-10-CM | POA: Diagnosis not present

## 2023-08-15 DIAGNOSIS — G479 Sleep disorder, unspecified: Secondary | ICD-10-CM

## 2023-08-15 DIAGNOSIS — Z23 Encounter for immunization: Secondary | ICD-10-CM | POA: Diagnosis not present

## 2023-08-15 DIAGNOSIS — R32 Unspecified urinary incontinence: Secondary | ICD-10-CM

## 2023-08-15 MED ORDER — ALBUTEROL SULFATE HFA 108 (90 BASE) MCG/ACT IN AERS: 2.0000 | INHALATION_SPRAY | RESPIRATORY_TRACT | 2 refills | Status: AC | PRN

## 2023-08-15 NOTE — Assessment & Plan Note (Addendum)
Diagnosed with overflow incontinence 2/2 pelvic organ prolapse. Due to age and frailty not a good candidate for surgical intervention, and recommend against pharmacotherapy. -Incontinence supplies ordered (ordered in telephone encounter)

## 2023-08-15 NOTE — Assessment & Plan Note (Signed)
Improved with albuterol.  -Continue albuterol PRN

## 2023-08-15 NOTE — Progress Notes (Signed)
    SUBJECTIVE:   CHIEF COMPLAINT / HPI:   Chronic cough Recently seen Dr. Jena Gauss in our clinic.  X-rays obtained that showed left-sided pleural effusion.  Given patient's age and goals of care, decision was made not to pursue thoracentesis particularly with her frailty.  She was trialed on albuterol for cough. This is going well for her.  Sleep disturbance Trouble falling asleep at night. Watches TV until bed time. Bedtime is 10 pm, but often stays up later. Not related to pain. Has not tried any medications or other interventions.  Incontinence Patient requires wheelchair for ambulation. Has chronic urinary incontinence for which she wears pads. Diagnosed with overflow incontinence 2/2 pelvic organ prolapse.  OBJECTIVE:   BP (!) 108/59   Pulse (!) 54   Temp 98 F (36.7 C) (Oral)   Wt 134 lb 6 oz (61 kg)   SpO2 98%   BMI 26.24 kg/m    General: NAD, pleasant Cardio: RRR, no MRG. Respiratory: CTAB, normal wob on RA GI: Abdomen is soft, not tender, not distended. BS present Skin: Warm and dry  ASSESSMENT/PLAN:   Assessment & Plan Urinary incontinence, unspecified type Diagnosed with overflow incontinence 2/2 pelvic organ prolapse. Due to age and frailty not a good candidate for surgical intervention, and recommend against pharmacotherapy. -Incontinence supplies ordered (ordered in telephone encounter) Chronic cough Improved with albuterol.  -Continue albuterol PRN Sleep disturbance Primarily suspect insomnia.  -counseled on sleep hygiene  -3 mg melatonin   Tiffany Kocher, DO [facility]

## 2023-08-15 NOTE — Telephone Encounter (Signed)
Incontinence supplies ordered

## 2023-08-15 NOTE — Addendum Note (Signed)
Addended by: Tiffany Kocher on: 08/15/2023 10:58 AM   Modules accepted: Orders

## 2023-08-15 NOTE — Patient Instructions (Addendum)
It was great to see you! Thank you for allowing me to participate in your care!   I recommend that you always bring your medications to each appointment as this makes it easy to ensure we are on the correct medications and helps [place] not miss when refills are needed.  Our plans for today:  - You can stop taking magnesium - You can take albuterol inhaler every 4 hours AS NEEDED - You can take 3 mg of melatonin 2 hours before bed time to help with sleep - I have sent a message to our staff to schedule her medicare annual wellness  Take care and seek immediate care sooner if you develop any concerns. Please remember to show up 15 minutes before your scheduled appointment time!  Tiffany Kocher, DO [facility]

## 2023-08-17 ENCOUNTER — Other Ambulatory Visit: Payer: Self-pay | Admitting: Student

## 2023-08-17 DIAGNOSIS — I5032 Chronic diastolic (congestive) heart failure: Secondary | ICD-10-CM

## 2023-08-22 NOTE — Telephone Encounter (Signed)
Receipt confirmed by [employer].   Beyounce Dickens C Christopherjame Carnell, RN  

## 2023-08-22 NOTE — Telephone Encounter (Signed)
Community message sent to [employer]. Will await response.   Jozlyn Schatz C January Bergthold, RN  

## 2023-08-29 ENCOUNTER — Encounter: Payer: Self-pay | Admitting: Internal Medicine

## 2023-08-29 ENCOUNTER — Ambulatory Visit: Payer: Medicare (Managed Care) | Attending: Internal Medicine | Admitting: Internal Medicine

## 2023-08-29 VITALS — BP 120/52 | HR 51 | Ht 60.0 in | Wt 145.0 lb

## 2023-08-29 DIAGNOSIS — I5032 Chronic diastolic (congestive) heart failure: Secondary | ICD-10-CM

## 2023-08-29 DIAGNOSIS — I351 Nonrheumatic aortic (valve) insufficiency: Secondary | ICD-10-CM

## 2023-08-29 DIAGNOSIS — I480 Paroxysmal atrial fibrillation: Secondary | ICD-10-CM | POA: Diagnosis not present

## 2023-08-29 DIAGNOSIS — I361 Nonrheumatic tricuspid (valve) insufficiency: Secondary | ICD-10-CM

## 2023-08-29 DIAGNOSIS — I10 Essential (primary) hypertension: Secondary | ICD-10-CM

## 2023-08-29 NOTE — Patient Instructions (Signed)
Medication Instructions:  Your physician recommends that you continue on your current medications as directed. Please refer to the Current Medication list given to you today.  *If you need a refill on your cardiac medications before your next appointment, please call your pharmacy*   Lab Work: BNP, BMP  If you have labs (blood work) drawn today and your tests are completely normal, you will receive your results only by: MyChart Message (if you have MyChart) OR A paper copy in the mail If you have any lab test that is abnormal or we need to change your treatment, we will call you to review the results.   Testing/Procedures: Your physician has requested that you have an echocardiogram. Echocardiography is a painless test that uses sound waves to create images of your heart. It provides your doctor with information about the size and shape of your heart and how well your heart's chambers and valves are working. This procedure takes approximately one hour. There are no restrictions for this procedure. Please do NOT wear cologne, perfume, aftershave, or lotions (deodorant is allowed). Please arrive 15 minutes prior to your appointment time.    Follow-Up: At [facility], you and your health needs are our priority.  As part of our continuing mission to provide you with exceptional heart care, we have created designated Provider Care Teams.  These Care Teams include your primary Cardiologist (physician) and Advanced Practice Providers (APPs -  Physician Assistants and Nurse Practitioners) who all work together to provide you with the care you need, when you need it.  We recommend signing up for the patient portal called "MyChart".  Sign up information is provided on this After Visit Summary.  MyChart is used to connect with patients for Virtual Visits (Telemedicine).  Patients are able to view lab/test results, encounter notes, upcoming appointments, etc.  Non-urgent messages can be sent  to your provider as well.   To learn more about what you can do with MyChart, go to [email or web address].    Your next appointment:   3-4 month(s)  Provider:   Ronie Spies, PA-C, Tereso Newcomer, PA-C, or Perlie Gold, PA-C     Valrie Hart, MD will plan to see you again in 8 month(s).

## 2023-08-29 NOTE — Progress Notes (Signed)
4.00 cm 2.42 cm/m   RA Area:     14.10 cm LA Vol (A2C):   48.1 ml 29.12 ml/m  RA Volume:   32.60 ml  19.74 ml/m LA Vol (A4C):   39.4 ml 23.85 ml/m LA Biplane Vol: 44.4 ml 26.88 ml/m AORTIC VALVE AV Area (Vmax):    2.08 cm AV Area (Vmean):   2.18 cm AV Area (VTI):     1.93 cm AV Vmax:           146.00 cm/s AV Vmean:          98.800 cm/s AV VTI:            0.244 m AV Peak Grad:      8.5 mmHg AV Mean Grad:      4.5 mmHg LVOT Vmax:         107.10 cm/s LVOT Vmean:        75.800 cm/s LVOT VTI:          0.166 m LVOT/AV VTI ratio: 0.68 AI PHT:            657 msec  AORTA Ao Root diam: 3.10 cm Ao Asc diam:  3.70 cm  MV E velocity: 110.00 cm/s  TRICUSPID VALVE TR Peak grad:   31.6 mmHg TR Vmax:        281.00 cm/s  SHUNTS Systemic VTI:  0.17 m Systemic Diam: 1.90 cm  Orpah Cobb MD Electronically signed by Orpah Cobb MD Signature Date/Time: 09/17/2022/12:20:08 PM    Final   TEE  ECHO TEE 09/24/2022  Narrative TRANSESOPHOGEAL ECHO REPORT    Patient Name:   [name] [name] Date of Exam: 09/24/2022 Medical Rec #:  [id number]        Height:       60.0 in Accession #:    [id number]       Weight:       158.5 lb Date of Birth:  1930/01/15       BSA:          1.691 m Patient Age:    89 years         BP:           111/68 mmHg Patient Gender: F                HR:           118 bpm. Exam Location:  Inpatient  Procedure: Transesophageal Echo, Cardiac Doppler and Color Doppler  Indications:     atrial fibrillation  History:         Patient has prior history of Echocardiogram examinations, most recent  09/17/2022. CHF; Risk Factors:Hypertension, Dyslipidemia and Diabetes.  Sonographer:     Delcie Roch RDCS Referring Phys:  [id number] Orpah Cobb Diagnosing Phys: Orpah Cobb MD  PROCEDURE: After discussion of the risks and benefits of a TEE, an informed consent was obtained from the patient. The transesophogeal probe was passed without difficulty through the esophogus of the patient. Imaged were obtained with the patient in a left lateral decubitus position. Sedation performed by performing physician. Patients was under conscious sedation during this procedure., 2.5mg  of Versed. The patient developed no complications during the procedure.  IMPRESSIONS   1. Left ventricular ejection fraction, by estimation, is 55 to 60%. The left ventricle has normal function. The left ventricle has no regional wall motion abnormalities. Left ventricular diastolic parameters are indeterminate. 2. Right ventricular systolic function is low normal. The right ventricular size is normal. Mildly increased  Cardiology Office Note:  .    Date:  08/29/2023  ID:  [name] [name], DOB 23-Aug-1930, MRN [id number] PCP: Tiffany Kocher, DO  [facility] Providers Cardiologist:  None     CC: Transition to new cardiologist  History of Present Illness: .    [name] [name] is a 89 y.o. female with a history of pAF well controlled on low dose amiodarone s/p DCCV, HTN with DM, and prior stroke, on low dose eliquis, HFpEF with moderate TR and mild AI here to establish care.  Discussed the use of AI scribe software for clinical note transcription with the patient, who gave verbal consent to proceed.  History of Present Illness         [name] [name], a 89 year old female with a history of hypertension, stroke, deep vein thrombosis, and chronic heart failure, presents for a follow-up visit. She was previously under the care of Dr. Shari Prows, who noted that the patient was requiring nocturnal oxygen but was otherwise doing well. A rhythm control strategy was planned for her atrial fibrillation, and she had no further GI bleeds. The patient reports that her heart rate was 51, but had no associated fatigue or weakness. She notes that when she was out of rhythm, her breathing was worse and she needed more fluid removed. She describes feeling shaky when in atrial fibrillation, but has not had more shakiness since her last visit. She is currently on a low dose of amiodarone and metoprolol for her heart condition, a low dose blood thinner due to her age and kidney function, and atorvastatin for her cholesterol. She also takes a fluid pill, Lasix, once a day. She reports urinating a lot when she takes the Lasix. She also mentions that her primary care doctor noted a small amount of fluid on her lung with a cough; this resolved with no change in her diuretics (she is now on only 20 mg dose) but with a nasal spray for allergies.  Relevant histories: .  Social- her [place] is seen at the practice [place] [facility]  pt ROS: As per HPI.   Studies Reviewed: .   Cardiac Studies & Procedures       ECHOCARDIOGRAM  ECHOCARDIOGRAM COMPLETE 09/17/2022  Narrative ECHOCARDIOGRAM REPORT    Patient Name:   [name] [name] Date of Exam: 09/17/2022 Medical Rec #:  [id number]        Height:       60.0 in Accession #:    [id number]       Weight:       150.0 lb Date of Birth:  11-24-1930       BSA:          1.652 m Patient Age:    89 years         BP:           113/73 mmHg Patient Gender: F                HR:           104 bpm. Exam Location:  Inpatient  Procedure: 2D Echo, Cardiac Doppler and Color Doppler  Indications:     Atrial Fibrillation I48.91  History:         Patient has prior history of Echocardiogram examinations, most recent 05/25/2018. CHF, Stroke, Arrythmias:Atrial Fibrillation; Risk Factors:Hypertension and Dyslipidemia.  Sonographer:     Lucendia Herrlich Referring Phys:  [id number] Heide Scales Diagnosing Phys: Orpah Cobb MD  IMPRESSIONS   1. Left  Cardiology Office Note:  .    Date:  08/29/2023  ID:  [name] [name], DOB 23-Aug-1930, MRN [id number] PCP: Tiffany Kocher, DO  [facility] Providers Cardiologist:  None     CC: Transition to new cardiologist  History of Present Illness: .    [name] [name] is a 89 y.o. female with a history of pAF well controlled on low dose amiodarone s/p DCCV, HTN with DM, and prior stroke, on low dose eliquis, HFpEF with moderate TR and mild AI here to establish care.  Discussed the use of AI scribe software for clinical note transcription with the patient, who gave verbal consent to proceed.  History of Present Illness         [name] [name], a 89 year old female with a history of hypertension, stroke, deep vein thrombosis, and chronic heart failure, presents for a follow-up visit. She was previously under the care of Dr. Shari Prows, who noted that the patient was requiring nocturnal oxygen but was otherwise doing well. A rhythm control strategy was planned for her atrial fibrillation, and she had no further GI bleeds. The patient reports that her heart rate was 51, but had no associated fatigue or weakness. She notes that when she was out of rhythm, her breathing was worse and she needed more fluid removed. She describes feeling shaky when in atrial fibrillation, but has not had more shakiness since her last visit. She is currently on a low dose of amiodarone and metoprolol for her heart condition, a low dose blood thinner due to her age and kidney function, and atorvastatin for her cholesterol. She also takes a fluid pill, Lasix, once a day. She reports urinating a lot when she takes the Lasix. She also mentions that her primary care doctor noted a small amount of fluid on her lung with a cough; this resolved with no change in her diuretics (she is now on only 20 mg dose) but with a nasal spray for allergies.  Relevant histories: .  Social- her son is seen at the practice ([place] [facility]  pt ROS: As per HPI.   Studies Reviewed: .   Cardiac Studies & Procedures       ECHOCARDIOGRAM  ECHOCARDIOGRAM COMPLETE 09/17/2022  Narrative ECHOCARDIOGRAM REPORT    Patient Name:   [name] [name] Date of Exam: 09/17/2022 Medical Rec #:  [id number]        Height:       60.0 in Accession #:    [id number]       Weight:       150.0 lb Date of Birth:  11-24-1930       BSA:          1.652 m Patient Age:    89 years         BP:           113/73 mmHg Patient Gender: F                HR:           104 bpm. Exam Location:  Inpatient  Procedure: 2D Echo, Cardiac Doppler and Color Doppler  Indications:     Atrial Fibrillation I48.91  History:         Patient has prior history of Echocardiogram examinations, most recent 05/25/2018. CHF, Stroke, Arrythmias:Atrial Fibrillation; Risk Factors:Hypertension and Dyslipidemia.  Sonographer:     Lucendia Herrlich Referring Phys:  [id number] Heide Scales Diagnosing Phys: Orpah Cobb MD  IMPRESSIONS   1. Left  4.00 cm 2.42 cm/m   RA Area:     14.10 cm LA Vol (A2C):   48.1 ml 29.12 ml/m  RA Volume:   32.60 ml  19.74 ml/m LA Vol (A4C):   39.4 ml 23.85 ml/m LA Biplane Vol: 44.4 ml 26.88 ml/m AORTIC VALVE AV Area (Vmax):    2.08 cm AV Area (Vmean):   2.18 cm AV Area (VTI):     1.93 cm AV Vmax:           146.00 cm/s AV Vmean:          98.800 cm/s AV VTI:            0.244 m AV Peak Grad:      8.5 mmHg AV Mean Grad:      4.5 mmHg LVOT Vmax:         107.10 cm/s LVOT Vmean:        75.800 cm/s LVOT VTI:          0.166 m LVOT/AV VTI ratio: 0.68 AI PHT:            657 msec  AORTA Ao Root diam: 3.10 cm Ao Asc diam:  3.70 cm  MV E velocity: 110.00 cm/s  TRICUSPID VALVE TR Peak grad:   31.6 mmHg TR Vmax:        281.00 cm/s  SHUNTS Systemic VTI:  0.17 m Systemic Diam: 1.90 cm  Orpah Cobb MD Electronically signed by Orpah Cobb MD Signature Date/Time: 09/17/2022/12:20:08 PM    Final   TEE  ECHO TEE 09/24/2022  Narrative TRANSESOPHOGEAL ECHO REPORT    Patient Name:   [name] [name] Date of Exam: 09/24/2022 Medical Rec #:  [id number]        Height:       60.0 in Accession #:    [id number]       Weight:       158.5 lb Date of Birth:  1930/01/15       BSA:          1.691 m Patient Age:    89 years         BP:           111/68 mmHg Patient Gender: F                HR:           118 bpm. Exam Location:  Inpatient  Procedure: Transesophageal Echo, Cardiac Doppler and Color Doppler  Indications:     atrial fibrillation  History:         Patient has prior history of Echocardiogram examinations, most recent  09/17/2022. CHF; Risk Factors:Hypertension, Dyslipidemia and Diabetes.  Sonographer:     Delcie Roch RDCS Referring Phys:  [id number] Orpah Cobb Diagnosing Phys: Orpah Cobb MD  PROCEDURE: After discussion of the risks and benefits of a TEE, an informed consent was obtained from the patient. The transesophogeal probe was passed without difficulty through the esophogus of the patient. Imaged were obtained with the patient in a left lateral decubitus position. Sedation performed by performing physician. Patients was under conscious sedation during this procedure., 2.5mg  of Versed. The patient developed no complications during the procedure.  IMPRESSIONS   1. Left ventricular ejection fraction, by estimation, is 55 to 60%. The left ventricle has normal function. The left ventricle has no regional wall motion abnormalities. Left ventricular diastolic parameters are indeterminate. 2. Right ventricular systolic function is low normal. The right ventricular size is normal. Mildly increased  Cardiology Office Note:  .    Date:  08/29/2023  ID:  [name] [name], DOB 23-Aug-1930, MRN [id number] PCP: Tiffany Kocher, DO  [facility] Providers Cardiologist:  None     CC: Transition to new cardiologist  History of Present Illness: .    [name] [name] is a 89 y.o. female with a history of pAF well controlled on low dose amiodarone s/p DCCV, HTN with DM, and prior stroke, on low dose eliquis, HFpEF with moderate TR and mild AI here to establish care.  Discussed the use of AI scribe software for clinical note transcription with the patient, who gave verbal consent to proceed.  History of Present Illness         [name] [name], a 89 year old female with a history of hypertension, stroke, deep vein thrombosis, and chronic heart failure, presents for a follow-up visit. She was previously under the care of Dr. Shari Prows, who noted that the patient was requiring nocturnal oxygen but was otherwise doing well. A rhythm control strategy was planned for her atrial fibrillation, and she had no further GI bleeds. The patient reports that her heart rate was 51, but had no associated fatigue or weakness. She notes that when she was out of rhythm, her breathing was worse and she needed more fluid removed. She describes feeling shaky when in atrial fibrillation, but has not had more shakiness since her last visit. She is currently on a low dose of amiodarone and metoprolol for her heart condition, a low dose blood thinner due to her age and kidney function, and atorvastatin for her cholesterol. She also takes a fluid pill, Lasix, once a day. She reports urinating a lot when she takes the Lasix. She also mentions that her primary care doctor noted a small amount of fluid on her lung with a cough; this resolved with no change in her diuretics (she is now on only 20 mg dose) but with a nasal spray for allergies.  Relevant histories: .  Social- her son is seen at the practice ([place] [facility]  pt ROS: As per HPI.   Studies Reviewed: .   Cardiac Studies & Procedures       ECHOCARDIOGRAM  ECHOCARDIOGRAM COMPLETE 09/17/2022  Narrative ECHOCARDIOGRAM REPORT    Patient Name:   [name] [name] Date of Exam: 09/17/2022 Medical Rec #:  [id number]        Height:       60.0 in Accession #:    [id number]       Weight:       150.0 lb Date of Birth:  11-24-1930       BSA:          1.652 m Patient Age:    89 years         BP:           113/73 mmHg Patient Gender: F                HR:           104 bpm. Exam Location:  Inpatient  Procedure: 2D Echo, Cardiac Doppler and Color Doppler  Indications:     Atrial Fibrillation I48.91  History:         Patient has prior history of Echocardiogram examinations, most recent 05/25/2018. CHF, Stroke, Arrythmias:Atrial Fibrillation; Risk Factors:Hypertension and Dyslipidemia.  Sonographer:     Lucendia Herrlich Referring Phys:  [id number] Heide Scales Diagnosing Phys: Orpah Cobb MD  IMPRESSIONS   1. Left

## 2023-08-30 LAB — BASIC METABOLIC PANEL
BUN/Creatinine Ratio: 20 (ref 12–28)
BUN: 17 mg/dL (ref 10–36)
CO2: 29 mmol/L (ref 20–29)
Calcium: 9 mg/dL (ref 8.7–10.3)
Chloride: 102 mmol/L (ref 96–106)
Creatinine, Ser: 0.86 mg/dL (ref 0.57–1.00)
Glucose: 87 mg/dL (ref 70–99)
Potassium: 4.6 mmol/L (ref 3.5–5.2)
Sodium: 140 mmol/L (ref 134–144)
eGFR: 64 mL/min/{1.73_m2} (ref >59)

## 2023-08-30 LAB — PRO B NATRIURETIC PEPTIDE: NT-Pro BNP: 516 pg/mL (ref 0–738)

## 2023-09-03 ENCOUNTER — Telehealth: Payer: Self-pay

## 2023-09-03 DIAGNOSIS — I361 Nonrheumatic tricuspid (valve) insufficiency: Secondary | ICD-10-CM

## 2023-09-03 DIAGNOSIS — I5032 Chronic diastolic (congestive) heart failure: Secondary | ICD-10-CM

## 2023-09-03 MED ORDER — FUROSEMIDE 40 MG PO TABS: 40.0000 mg | ORAL_TABLET | Freq: Every day | ORAL | 3 refills | Status: DC

## 2023-09-03 MED ORDER — POTASSIUM CHLORIDE CRYS ER 20 MEQ PO TBCR: 20.0000 meq | EXTENDED_RELEASE_TABLET | Freq: Every day | ORAL | 3 refills | Status: AC

## 2023-09-03 NOTE — Telephone Encounter (Signed)
-----   Message from Christell Constant sent at 08/31/2023  2:49 PM EDT ----- Normal labs.  Recommend increase to lasix 40 mg PO Daily K 20 meq Po Daily and BMP in two weeks

## 2023-09-03 NOTE — Telephone Encounter (Signed)
The patient son has been notified of the result and verbalized understanding.  All questions (if any) were answered. Arvid Right Cambreigh Dearing, RN 09/03/2023 9:32 AM  Pt will have BMP drawn on 09/17/23.  Son had questions regarding insurance advised to call into the office use option 7 for billing to address concern.

## 2023-09-03 NOTE — Telephone Encounter (Signed)
Left a detailed message advising son pt is to take Potassium chloride 20 mEq PO every day.  Advised to call back if has further questions or concerns.

## 2023-09-03 NOTE — Telephone Encounter (Signed)
[name]) wants a call back regarding if patient should be taking potassium or calcium.

## 2023-09-10 ENCOUNTER — Encounter: Payer: Self-pay | Admitting: Neurology

## 2023-09-10 ENCOUNTER — Ambulatory Visit (INDEPENDENT_AMBULATORY_CARE_PROVIDER_SITE_OTHER): Payer: Medicare (Managed Care) | Admitting: Neurology

## 2023-09-10 VITALS — BP 114/54 | HR 56 | Ht 60.0 in | Wt 131.0 lb

## 2023-09-10 DIAGNOSIS — Z8673 Personal history of transient ischemic attack (TIA), and cerebral infarction without residual deficits: Secondary | ICD-10-CM

## 2023-09-10 DIAGNOSIS — E0842 Diabetes mellitus due to underlying condition with diabetic polyneuropathy: Secondary | ICD-10-CM

## 2023-09-10 DIAGNOSIS — G250 Essential tremor: Secondary | ICD-10-CM | POA: Diagnosis not present

## 2023-09-10 DIAGNOSIS — Z9289 Personal history of other medical treatment: Secondary | ICD-10-CM

## 2023-09-10 DIAGNOSIS — G40109 Localization-related (focal) (partial) symptomatic epilepsy and epileptic syndromes with simple partial seizures, not intractable, without status epilepticus: Secondary | ICD-10-CM | POA: Diagnosis not present

## 2023-09-10 NOTE — Progress Notes (Signed)
Subjective:    Patient ID: [name] [name] is a 89 y.o. female.  HPI    Interim history:   [name] [name] is a very pleasant 89 year old female with an underlying complex medical history of hypertension, hyperlipidemia, A-fib, upper GI bleed, PE, DVT, pneumonia, anxiety, overweight state, diabetes, peripheral neuropathy, stroke, Hx of seizures, essential tremor, and prolonged hospitalization in October 2023, who presents for follow-up consultation of her essential tremor, and prior history of seizure and stroke. The patient is accompanied by her son again today. I last saw her on 03/05/2022, at which time she reported feeling fairly stable.  She had no recent seizures, no falls, she was using her walker at all times and had a stable tremor.  She often felt leg heaviness and felt the tremor inside.  She was on Keppra generic twice daily at 500 mg strength and was also taking Mysoline twice daily.  She was on gabapentin 100 mg at bedtime.  She was advised to continue with her medication regimen and follow-up routinely in this clinic in 1 year.   Today, 09/10/2023: She reports feeling all right, no recent new symptoms but had a rough past year.  Unfortunately, she had a significant setback medically, she had presented to the emergency room on 09/16/2022 with rapid heartbeat and weakness.  She was hospitalized from 10/23 through 10/04/2022 with pleural effusion, acute on chronic CHF, new onset A-fib with RVR, complicated with PE, abdominal pain, upper GI bleed.  She was started on Eliquis.  Xanax and primidone were stopped.  She was in inpatient rehab from 10/04/22 through 10/23/22.  Initially her Lasix was discontinued and she was started on torsemide, she was then restarted on Lasix and it was increased recently to twice daily.  Her son provides most of her history.  He facilitates translation as well.   The patient's allergies, current medications, family history, past medical history, past social history,  past surgical history and problem list were reviewed and updated as appropriate.   Previously:     I saw her on 03/01/2021, at which time she felt that her tremor was a little worse.  We continued her Mysoline at 250 mg twice daily, I did not feel comfortable increasing it.  She had pain in her feet.  She was already on Cymbalta and gabapentin low-dose, we had reduced the gabapentin before because of side effects and I suggested we do not increase any of her medications for fear of side effects.  She was advised to use her walker at all times.    I saw her on 05/30/2020, at which time she reported that her tremor was worse, sometimes she feels an inner tremor in addition to her hand and head tremor. It was hard for her to feed herself. She denied any recent new symptoms such as one-sided weakness or numbness or tingling or seizures or falls. Her heart doctor told her that she could skip or reduce her Lasix as long as she avoided excess salt.  She had not had much in the way of swelling. I did not suggest increasing her Mysoline given that she was already on a higher dose.  She was advised to routinely follow-up in this clinic in 6 months.     I saw her on 10/27/2019, at which time she was fairly stable.  Sometimes she felt that her right side was a little weaker, she had residual weakness from the stroke in the past.  She was advised to look into using  weighted utensils to be able to feed herself a little better.   Her son called in the interim with concern of worsening tremor.  I also received a staff message from her primary care physician regarding this.  I did not suggest increasing her Mysoline as she is already on a high dose.     I saw her on 09/07/2018, at which time she was stable.  She had issues with back pain, she had been found to have osteopenia.  She had no recent falls.  She has had right-sided residual weakness from her stroke.  Her tremor has been somewhat progressive.  She had no recent  seizures.    She saw Shawnie Dapper, nurse practitioner in the interim with a phone call visit on 04/26/2019, at which time she was stable.    I saw her on 07/08/2017 for a sooner than scheduled appointment because she felt that the tremor was worse, she also had intermittent leg cramps. She had issues with low back pain. She had a recent lumbar spine MRI. She was supposed to see Dr. Alvester Morin for injection into the spine. She was advised to continue with low-dose gabapentin, Mysoline twice daily, and Keppra twice daily.     I saw her on 11/11/2016, at which time her tremor was mostly stable, she had no recent falls. Her right leg weakness was about the same as well. She did present to the emergency room in November 2017 for back pain and leg pain. She was found to have a UTI.  She was also found on CT lumbar spine to have a L1 vertebral body fracture, old.    She was seen in the interim by Butch Penny on 05/13/2017, at which time she was noted to be fairly stable and her medications were kept the same.   She presented in the interim to the emergency room on 05/31/2017 for hip pain on the left. She saw her family physician on 07/02/2017.   I saw her on 05/08/2016, at which time she had recently fallen about 10 days prior. She had visited her grandmother daughter who is expecting a baby and fell backwards landed on her back. She had left-sided back pain since then. She went to the emergency room on 05/04/2016 and was given an injection with Decadron, Toradol, Dilaudid, but did not have much in way of relief. She had a bone scan on 05/06/16, which I reviewed: IMPRESSION: 1. No evidence of a recent fracture. 2. No evidence of neoplastic disease to bone. 3. Significant degenerative uptake noted along spine as well as involving multiple bilateral joints. She reported that her tremor was worse. She had more stress and also more pain.   I suggested she continue with her Keppra, Plavix, Mysoline and  Neurontin.   I saw her on 09/25/15, at which time she felt she was doing well. She was worried about her brother in [place]. She was not able to travel to [place] and was sad about it. She was planning to fly to [place] to be with her daughter and other family for some time. No recent seizures or strokelike symptoms were reported. Tremor was stable. She was using a cane and had no recent falls. I suggested we continue with Keppra, Plavix, gabapentin and Mysoline at the same doses.   I saw her on 04/26/2015 at which time she reported difficulty with her balance. Her tremor had become a little worse. She had had no recent seizures or auras. She was able to tolerate  Keppra 500 mg twice daily. She was on the same dose of Mysoline for her tremors. She had benefited from physical therapy at [facility] and requested to go back for outpatient physical therapy. She had not fallen. She was using a 4 pronged cane and was no longer using her walker.   I saw her on 11/16/2014, at which time her son reported that she was doing well. She was in outpatient physical therapy and was using a 2 wheeled walker. She was taking gabapentin at a lower dose. Lowering gabapentin had improved her heavy headedness in the morning. Her tremor was stable. She was tolerating Keppra 500 mg twice daily with no recent seizures or auras reported. Overall she has done well. I continued her on the same dose of primidone, the same dose of Keppra, and suggested we reduce her gabapentin further to 100 mg in the morning and 200 mg in the evening.    Of note, they canceled an appointment for 03/17/2015.   I saw her on 08/19/2014 at which time she presented after her hospitalization for stroke. Her son reported that Aggrenox was causing her headaches and she was switched to Plavix. We talked about sleep apnea screening. She had no witnessed apneas and only mild snoring. She had done fairly well. They requested a referral to outpatient therapy  in [facility]. She was placed on Keppra for episodic twitching. She had had some medication changes when she was hospitalized for her stroke. She was advised to use her 2 wheeled walker with assistance. I referred her to physical therapy outpatient. We talked about secondary stroke prevention. I suggested she reduce gabapentin to 200 mg twice daily down from 300 mg twice daily because of dizziness and head heaviness reported. This continued on Mysoline for her essential tremor.   I saw her on 10/27/2013 at which time she presented for followup of her long-standing history of essential tremor. I felt she was stable in that regard and felt that her diabetes had resulted in diabetic neuropathy. I asked her to continue with Mysoline at the same strength and increased her gabapentin to 3 pills at night for a total of 300 mg each bedtime.   In the interim she presented to the emergency room on 07/18/2014 with new onset right-sided weakness, facial droop, slurring of speech which started around 6 PM the night before. She was in [place], [place] visiting family when she had acute onset of the symptoms. She was seen in [place] at the hospital and CT head was negative for bleed but she did not receive TPA because of low platelet count of 90,000. She had improvement of her symptoms but persistent deficits. She was admitted to the hospital and had a complete stroke workup. She had an MRI of the brain as well as MRA head and was found to have a left thalamic ischemic stroke. Neurology was consulted. MRA showed left PCA occlusion. Echocardiogram showed an EF of 60-65% with no wall motion abnormalities and no source of emboli. Carotid Doppler studies were negative for any significant ICA stenosis (1-39%). Hb A1c was 6. LDL was 62. She was placed on aspirin for secondary stroke prevention and then changed to Aggrenox. Blood work showed normal B12 levels, negative hepatitis serology, chest x-ray was negative. EKG did  not show any ST abnormalities. She had some left leg pain. Lower extremity Doppler studies from 07/20/2014 were negative for DVT. She was discharged on 07/21/2014 to inpatient rehabilitation. She was discharged from rehabilitation  on 08/02/2014. I reviewed hospital records as well as imaging tests. Her daughter called and asked for a sooner than scheduled appointment because of worsening weakness. However, she missed an appointment on 08/05/2014 because she was readmitted to the hospital on 08/04/2014 for slurring of speech and shaking spells. I reviewed the hospital records. She had an EEG, which was negative for seizure activity but she was felt to have partial onset seizures and was started on Keppra which is currently at 500 mg twice daily. She was changed from Aggrenox to Plavix 75 mg.   She had a brain MRI without contrast on 08/05/2014: 1. Persistent 1.1 cm focus of linear restricted diffusion within the lateral left thalamus near the posterior limb of the left internal capsule. Finding is favored to reflect resolving infarct from the 07/19/2014 study given the relative decreased signal intensity and size from that prior exam, although possible acute re-infarction of this area is not entirely excluded. 2. No other acute intracranial abnormality. In addition, I personally reviewed the images through the PACS system. She had an EEG on 08/06/2014: This was reported as normal in the asleep and awake states. Her son called on 08/15/2014 to make an appointment.   I first met her on 01/19/2013, at which time I increased her Mysoline 250 mg strength 1-1/2 pills to 2 pills daily. I did advise her of the potential increase risk of side effects. She previously followed with Dr. Avie Echevaria. She has a billateral UE and head tremor for over 25 years. She was tried on primidone while she was in [place] with improvement in tremor. It was discontinued when she came to the [place] because she was on Coumadin. Her  tremor affects her ability to feed herself, write, and put on her makeup and is worse with anxiety. Her daughter has a tremor as well. She was restarted on primidone by Dr. Sandria Manly in 3/12. She is no longer on coumadin. She was on coumadin for a history of DVT in her left leg following left foot injury. She has had numbness in her legs without pain. EMG/NCV 02/27/11 was normal except for some denervation in the left lumbosacral paraspinal muscles. B12 level was normal. She is on gabapentin 100 mg 2 at night with relief of symptoms. Her major complaint is that of ongoing tremor, which is worse in the morning. She has not had side effects such as grogginess or difficulty walking with the primidone. She no longer cooks. Her tremor involves her writing and abilities to feed herself. She also continues to take gabapentin 100mg  2 qHS for her neuropathy. She has not fallen per son. She was supposed to come back in 3 months but missed several appointments. She has been diagnosed with DM and has been started on metforim. She feels, her numbness and tingling is worse in her feet and she has now has started noticing burning sensation in her feet.  Her Past Medical History Is Significant For: Past Medical History:  Diagnosis Date   Anxiety    Diabetes (HCC) 10/27/2013   DVT (deep venous thrombosis) (HCC)    Essential tremor 10/27/2013   High cholesterol 1999   Hypertension    Ischemic stroke (HCC) 07/18/2014   Obesity, unspecified 10/27/2013   Pneumonia    Unspecified hereditary and idiopathic peripheral neuropathy 10/27/2013    Her Past Surgical History Is Significant For: Past Surgical History:  Procedure Laterality Date   BIOPSY  09/26/2022   Procedure: BIOPSY;  Surgeon: Lynann Bologna, MD;  Location: [facility] ENDOSCOPY;  Service: Gastroenterology;;   CATARACT EXTRACTION Bilateral    ESOPHAGOGASTRODUODENOSCOPY (EGD) WITH PROPOFOL N/A 09/26/2022   Procedure: ESOPHAGOGASTRODUODENOSCOPY (EGD) WITH PROPOFOL;  Surgeon:  Lynann Bologna, MD;  Location: [facility] ENDOSCOPY;  Service: Gastroenterology;  Laterality: N/A;   FOOT SURGERY     IR KYPHO LUMBAR INC FX REDUCE BONE BX UNI/BIL CANNULATION INC/IMAGING  06/15/2019    Her Family History Is Significant For: Family History  Problem Relation Age of Onset   Stroke Mother    Heart Problems Sister    Heart Problems Brother    Coronary artery disease Other    Tremor Neg Hx     Her Social History Is Significant For: Social History   Socioeconomic History   Marital status: Widowed    Spouse name: Not on file   Number of children: 5   Years of education: 9   Highest education level: Not on file  Occupational History    Comment: does not work  Tobacco Use   Smoking status: Never    Passive exposure: Never   Smokeless tobacco: Never  Vaping Use   Vaping status: Never Used  Substance and Sexual Activity   Alcohol use: No    Alcohol/week: 0.0 standard drinks of alcohol   Drug use: No   Sexual activity: Never  Other Topics Concern   Not on file  Social History Narrative   Patient is right handed and resides with son   Social Determinants of Health   Financial Resource Strain: Not on file  Food Insecurity: No Food Insecurity (09/23/2022)   Hunger Vital Sign    Worried About Running Out of Food in the Last Year: Never true    Ran Out of Food in the Last Year: Never true  Transportation Needs: No Transportation Needs (09/23/2022)   PRAPARE - Administrator, Civil Service (Medical): No    Lack of Transportation (Non-Medical): No  Physical Activity: Not on file  Stress: Not on file  Social Connections: Not on file    Her Allergies Are:  Allergies  Allergen Reactions   Tape Rash    Please do not use Plastic Tape  :   Her Current Medications Are:  Outpatient Encounter Medications as of 09/10/2023  Medication Sig   albuterol (VENTOLIN HFA) 108 (90 Base) MCG/ACT inhaler Inhale 2 puffs into the lungs every 4 (four) hours as needed for  wheezing or shortness of breath.   amiodarone (PACERONE) 200 MG tablet Take 1/2 tablet (100 mg total) by mouth daily.   apixaban (ELIQUIS) 2.5 MG TABS tablet Take 1 tablet (2.5 mg total) by mouth 2 (two) times daily.   atorvastatin (LIPITOR) 10 MG tablet Take 1 tablet (10 mg total) by mouth every evening.   Cholecalciferol (VITAMIN D PO) Take 1 tablet by mouth daily.   diclofenac Sodium (VOLTAREN) 1 % GEL APPLY 2 GRAMS TOPICALLY 4 TIMES A DAY AS NEEDED   fluticasone (FLONASE) 50 MCG/ACT nasal spray Place 1 spray into both nostrils daily. 1 spray in each nostril every day   furosemide (LASIX) 40 MG tablet Take 1 tablet (40 mg total) by mouth daily.   gabapentin (NEURONTIN) 100 MG capsule TAKE 1 CAPSULE BY MOUTH AT BEDTIME.   levETIRAcetam (KEPPRA) 500 MG tablet Take 1 tablet (500 mg total) by mouth 2 (two) times daily.   lidocaine (LIDODERM) 5 % Place 1 patch onto the skin daily. Remove & Discard patch within 12 hours or as directed by MD  metoprolol succinate (TOPROL XL) 25 MG 24 hr tablet Take 1 tablet (25 mg total) by mouth at bedtime.   Multiple Vitamin (MULTIVITAMIN WITH MINERALS) TABS tablet Take 1 tablet by mouth daily.   pantoprazole (PROTONIX) 40 MG tablet Take 1 tablet (40 mg total) by mouth daily.   polyethylene glycol (MIRALAX / GLYCOLAX) 17 g packet Take 17 g by mouth 2 (two) times daily.   potassium chloride SA (KLOR-CON M20) 20 MEQ tablet Take 1 tablet (20 mEq total) by mouth daily.   RESTASIS 0.05 % ophthalmic emulsion Place 1 drop into both eyes 2 (two) times daily.    Vitamin D, Ergocalciferol, (DRISDOL) 1.25 MG (50000 UNIT) CAPS capsule TAKE 1 CAPSULE (50,000 UNITS TOTAL) BY MOUTH EVERY WEDNESDAY.   No facility-administered encounter medications on file as of 09/10/2023.  :  Review of Systems:  Out of a complete 14 point review of systems, all are reviewed and negative with the exception of these symptoms as listed below:  Review of Systems  Neurological:        Patient  is here with her son for follow-up. He is interpreting for the patient today. Patient's son states her tremors are the same. She requires some help putting her clothes on and bathing, for example. She was last hospitalized November 2023 for A-fib RVR and also had GI bleed.    Objective:  Neurological Exam  Physical Exam Physical Examination:   Vitals:   09/10/23 1056  BP: (!) 114/54  Pulse: (!) 56    General Examination: The patient is a very pleasant 89 y.o. female in no acute distress. She appears frail, well-groomed, good spirits.  In wheelchair.    HEENT: Normocephalic, atraumatic, pupils are equal, round and reactive to light, tiny cut at the right lower eyelid in the middle.  Mild irritation but no swelling or sign of drainage.  Hearing is grossly intact. Extraocular tracking is good without nystagmus. She has a moderate degree of head tremor, fairly consistently. Moderate degree of voice tremor. She is not dysarthric. Oropharynx is clear. She has mild airway crowding, dentures in place. Neck is supple, shows FROM.     Chest: Clear to auscultation without wheezing, rhonchi or crackles noted.   Heart: S1+S2+0, regular and normal without murmurs, rubs or gallops noted.    Abdomen: Soft, non-tender and non-distended.   Extremities: There is no pitting edema in the lower extremities.     Skin: Warm and dry.     Musculoskeletal: exam reveals no obvious joint deformities.    Neurologically:  Mental status: The patient is awake, alert and oriented in all 4 spheres. His memory, attention, language and knowledge are appropriate, her son assists with translation. Mood is congruent and affect is normal.  Cranial nerves are as described above under HEENT exam.  Motor exam: thin bulk, global strength of 4 out of 5. Fine motor skills are globally moderately impaired in the upper and lower extremities.  Romberg is not tested for safety reasons. She has a bilateral upper extremity postural  and action tremor, on the moderate side.  Mild resting tremor component, intermittent.  Cerebellar testing shows no dysmetria or intention tremor.  Sensory exam: intact to light touch.   Gait, station and balance: I did not have her stand or walk for me today, in the wheelchair.     Assessment and Plan:    In summary, [name] [name] is a very pleasant 89 year old female with an underlying complex medical history of hypertension, hyperlipidemia,  A-fib, upper GI bleed, PE, DVT, pneumonia, anxiety, overweight state, diabetes, peripheral neuropathy, stroke, Hx of seizures, essential tremor, and prolonged hospitalization in October 2023, who presents for follow-up consultation of her essential tremor, and prior history of seizure and stroke.  She was on Mysoline generic 250 mg twice daily but this was stopped in November 2023 as she was hospitalized and she was also started on Eliquis at the time and she cannot take Mysoline at this time.  She is maintaining her dose of Keppra 500 mg twice daily.  She tolerates it.  I would like to stop her gabapentin which is low-dose 100 mg at bedtime, she has a history of peripheral neuropathy.  Her son reports that she feels that her feet are cold but she would be willing to go without gabapentin if possible.  We will stop it today.  She is advised to keep her feet warm by wearing socks that are not too tight.  Her tremor is in the moderate range but she cannot take primidone.  We will maintain her on Keppra 500 mg twice daily generic.  She has not had any recent seizures or seizure-like events thankfully.  She had a significant medical setback in October 2023 but thankfully is stable now.  She is in close follow-up with cardiology.  She is advised to follow-up to see Butch Penny, NP routinely in 1 year, sooner if needed.  I answered all the questions today and the patient and her son were in agreement.  I have reviewed her extensive hospital and rehab records today. I  spent 45 minutes in total face-to-face time and in reviewing records during pre-charting, more than 50% of which was spent in counseling and coordination of care, reviewing test results, reviewing medications and treatment regimen and/or in discussing or reviewing the diagnosis of history of stroke, history of seizure, essential tremor, the prognosis and treatment options. Pertinent laboratory and imaging test results that were available during this visit with the patient were reviewed by me and considered in my medical decision making (see chart for details).

## 2023-09-10 NOTE — Patient Instructions (Addendum)
It was nice to see you again today.  We will keep your seizure medication the same. You are off the primidone, you cannot take it as you are on Eliquis.  We will stop the gabapentin.

## 2023-09-11 ENCOUNTER — Other Ambulatory Visit: Payer: Self-pay | Admitting: Student

## 2023-09-12 ENCOUNTER — Telehealth: Payer: Self-pay | Admitting: Student

## 2023-09-12 NOTE — Telephone Encounter (Signed)
Called LVM to schedule Medicare Annual Wellness visit. If patients son calls back please assist in scheduling. Any questions please see me or message me.   Thanks!

## 2023-09-17 ENCOUNTER — Ambulatory Visit: Payer: Medicaid Other

## 2023-09-17 ENCOUNTER — Ambulatory Visit (HOSPITAL_COMMUNITY): Payer: Medicare (Managed Care) | Attending: Internal Medicine

## 2023-09-17 DIAGNOSIS — I5032 Chronic diastolic (congestive) heart failure: Secondary | ICD-10-CM | POA: Diagnosis present

## 2023-09-17 DIAGNOSIS — I351 Nonrheumatic aortic (valve) insufficiency: Secondary | ICD-10-CM | POA: Insufficient documentation

## 2023-09-17 DIAGNOSIS — I361 Nonrheumatic tricuspid (valve) insufficiency: Secondary | ICD-10-CM | POA: Insufficient documentation

## 2023-09-17 LAB — ECHOCARDIOGRAM COMPLETE
Area-P 1/2: 3.27 cm2
Est EF: >75
P 1/2 time: 609 ms
S' Lateral: 2.3 cm

## 2023-09-18 LAB — BASIC METABOLIC PANEL
BUN/Creatinine Ratio: 21 (ref 12–28)
BUN: 24 mg/dL (ref 10–36)
CO2: 28 mmol/L (ref 20–29)
Calcium: 9.1 mg/dL (ref 8.7–10.3)
Chloride: 99 mmol/L (ref 96–106)
Creatinine, Ser: 1.12 mg/dL — ABNORMAL HIGH (ref 0.57–1.00)
Glucose: 83 mg/dL (ref 70–99)
Potassium: 4.2 mmol/L (ref 3.5–5.2)
Sodium: 141 mmol/L (ref 134–144)
eGFR: 47 mL/min/{1.73_m2} — ABNORMAL LOW (ref >59)

## 2023-09-28 ENCOUNTER — Other Ambulatory Visit: Payer: Self-pay | Admitting: Student

## 2023-09-28 DIAGNOSIS — I5032 Chronic diastolic (congestive) heart failure: Secondary | ICD-10-CM

## 2023-10-13 ENCOUNTER — Other Ambulatory Visit: Payer: Self-pay

## 2023-10-13 MED ORDER — PANTOPRAZOLE SODIUM 40 MG PO TBEC: 40.0000 mg | DELAYED_RELEASE_TABLET | Freq: Every day | ORAL | 1 refills | Status: DC

## 2023-10-21 ENCOUNTER — Other Ambulatory Visit: Payer: Self-pay | Admitting: *Deleted

## 2023-10-21 ENCOUNTER — Other Ambulatory Visit: Payer: Self-pay

## 2023-10-21 ENCOUNTER — Telehealth: Payer: Self-pay | Admitting: Physician Assistant

## 2023-10-21 DIAGNOSIS — I480 Paroxysmal atrial fibrillation: Secondary | ICD-10-CM

## 2023-10-21 MED ORDER — APIXABAN 2.5 MG PO TABS: 2.5000 mg | ORAL_TABLET | Freq: Two times a day (BID) | ORAL | 5 refills | Status: DC

## 2023-10-21 NOTE — Telephone Encounter (Signed)
Eliquis 2.5mg  refill request received. Patient is 89 years old, weight-59.4kg, Crea-1.12 on 09/17/23, Diagnosis-Afib, and last seen by Dr. Izora Ribas on 08/29/23. Dose is appropriate based on dosing criteria. Will send in refill to requested pharmacy.

## 2023-10-21 NOTE — Telephone Encounter (Signed)
RX routed to [address] coumadin

## 2023-10-21 NOTE — Telephone Encounter (Signed)
*  STAT* If patient is at the pharmacy, call can be transferred to refill team.   1. Which medications need to be refilled? (please list name of each medication and dose if known)  apixaban (ELIQUIS) 2.5 MG TABS tablet  2. Which pharmacy/location (including street and city if local pharmacy) is medication to be sent to? [id number] - [place], [place] - [address] Phone: [phone]  Fax: [phone]   3. Do they need a 30 day or 90 day supply? 90  Pt has 1 pill left

## 2023-12-31 ENCOUNTER — Ambulatory Visit: Payer: Medicare (Managed Care) | Admitting: Physician Assistant

## 2024-01-02 ENCOUNTER — Ambulatory Visit: Payer: Medicare (Managed Care) | Admitting: Physician Assistant

## 2024-01-15 NOTE — Progress Notes (Unsigned)
Cardiology Office Note    Patient Name: [name] [name] Date of Encounter: 01/16/2024  Primary Care Provider:  Tiffany Kocher, DO Primary Cardiologist:  None Primary Electrophysiologist: None   Past Medical History    Past Medical History:  Diagnosis Date   Anxiety    Diabetes (HCC) 10/27/2013   DVT (deep venous thrombosis) (HCC)    Essential tremor 10/27/2013   High cholesterol 1999   Hypertension    Ischemic stroke (HCC) 07/18/2014   Obesity, unspecified 10/27/2013   Pneumonia    Unspecified hereditary and idiopathic peripheral neuropathy 10/27/2013    History of Present Illness  [name] [name] is a 89 y.o. female with a PMH of paroxysmal AF, lacunar CVA, HTN, HLD right lower leg DVT, essential tremor, DM type II, anxiety, HFpEF, GI bleed who presents today for 76-month follow-up.  [name] [name] establish care with Dr. Shari Prows in 04/2023 and is currently followed by Dr. Raynelle Jan.  She was previously admitted 08/2022 with AF with RVR and acute on chronic diastolic CHF.  This course was complicated by GI bleed secondary to gastric ulcer.  She underwent a TEE/DCCV and was discharged to rehab facility.  She was continued on amiodarone and digoxin and diltiazem was discontinued.  Wearing nocturnal O2 but otherwise doing well.  Her blood pressure was controlled patient was not placed on ASA 81 mg due to anticoagulation with AF.  She was seen by Dr. Raynelle Jan on 08/29/2023 and patient was stable with some crackles noted on exam with BNP and BMP ordered for further evaluation that was normal at 516 and Lasix was increased to 40 mg p.o. daily with 20 mEq of potassium. She has a history of essential tremeors and is on generic Keppra BID.  [name] [name] presents today for 20-month follow-up.  She reports no recent changes in her condition. She has been taking Lasix twice daily to manage fluid retention and reports good response to the medication. She denies any recent swelling in her ankles or  weight gain, indicating that the medication is effectively managing her fluid balance. She has experienced a dry mouth, which she manages by drinking 48-64 ounces of water daily. She is also on amiodarone to control her heart rate and Eliquis for anticoagulation. She has not missed any doses of her medications. She has not had any recent lab work done. She plans to travel out of town for a month with her sister.  Patient denies chest pain, palpitations, dyspnea, PND, orthopnea, nausea, vomiting, dizziness, syncope, edema, weight gain, or early satiety.   Review of Systems  Please see the history of present illness.    All other systems reviewed and are otherwise negative except as noted above.  Physical Exam    Wt Readings from Last 3 Encounters:  01/16/24 132 lb (59.9 kg)  09/10/23 131 lb (59.4 kg)  08/29/23 145 lb (65.8 kg)   VS: Vitals:   01/16/24 1438  BP: (!) 110/58  Pulse: (!) 55  SpO2: 94%  ,Body mass index is 25.78 kg/m. GEN: Well nourished, well developed in no acute distress Neck: No JVD; No carotid bruits Pulmonary: Clear to auscultation without rales, wheezing or rhonchi  Cardiovascular: Normal rate. Regular rhythm. Normal S1. Normal S2.   Murmurs: There is no murmur.  ABDOMEN: Soft, non-tender, non-distended EXTREMITIES:  No edema; No deformity   EKG/LABS/ Recent Cardiac Studies   ECG personally reviewed by me today -sinus bradycardia with rate of 57 bpm and left axis deviation with no acute changes  consistent with previous EKG.  Risk Assessment/Calculations:    CHA2DS2-VASc Score = 7   This indicates a 11.2% annual risk of stroke. The patient's score is based upon: CHF History: 1 HTN History: 0 Diabetes History: 0 Stroke History: 2 Vascular Disease History: 1 Age Score: 2 Gender Score: 1         Lab Results  Component Value Date   WBC 4.9 03/07/2023   HGB 10.3 (L) 03/07/2023   HCT 35.2 03/07/2023   MCV 77 (L) 03/07/2023   PLT 161 03/07/2023   Lab  Results  Component Value Date   CREATININE 1.12 (H) 09/17/2023   BUN 24 09/17/2023   NA 141 09/17/2023   K 4.2 09/17/2023   CL 99 09/17/2023   CO2 28 09/17/2023   Lab Results  Component Value Date   CHOL 161 09/17/2022   HDL 67 09/17/2022   LDLCALC 79 09/17/2022   TRIG 48 09/25/2022   CHOLHDL 2.4 09/17/2022    Lab Results  Component Value Date   HGBA1C 5.6 06/14/2022   Assessment & Plan    1.  Chronic HFpEF: -Last 2D echo completed 08/2023 showing hyperdynamic EF with grade 1 DD and mild AVR with trivial MVR -Today patient is euvolemic on exam today with stable weight since previous follow-up. -She reports compliance with her diuretics and was advised to continue current treatment with Lasix 40 mg daily, potassium 20 mEq, Toprol-XL 25 mg  2.  Paroxysmal AF: -Patient currently on amiodarone 100 mg daily, Toprol-XL 25 mg daily once daily Eliquis 2.5 mg twice daily -Patient is sinus bradycardia today and tolerating amiodarone without any adverse reactions. -She reports no missed doses of Eliquis 2.5 mg twice daily -We will check CMET and CBC today -Continue current rate control with amiodarone 100 mg daily, Toprol-XL 25 mg daily  3.  Primary hypertension: -Patient's blood pressure today was 110/58 -Continue Toprol-XL 25 mg daily  4.  History of CVA: -s/p lacunar CVA with no effects. -Continue current GDMT with Lipitor 10 mg daily and Toprol 25 mg daily  5.  Nonrheumatic AVR: -Patient 2D echo was last completed on 08/2023 showing hyperdynamic EF of 75% currently dilated LA for MVR mild AVR with no aortic stenosis -Continue Toprol-XL 25 mg daily -We will repeat 2D echo in October for surveillance    Disposition: Follow-up with None or APP in 8 months    Signed, Napoleon Form, Leodis Rains, NP 01/16/2024, 2:57 PM [facility]

## 2024-01-16 ENCOUNTER — Ambulatory Visit: Payer: Medicare (Managed Care) | Attending: Nurse Practitioner | Admitting: Nurse Practitioner

## 2024-01-16 ENCOUNTER — Encounter: Payer: Self-pay | Admitting: Nurse Practitioner

## 2024-01-16 VITALS — BP 110/58 | HR 55 | Ht 60.0 in | Wt 132.0 lb

## 2024-01-16 DIAGNOSIS — I10 Essential (primary) hypertension: Secondary | ICD-10-CM | POA: Diagnosis not present

## 2024-01-16 DIAGNOSIS — I480 Paroxysmal atrial fibrillation: Secondary | ICD-10-CM

## 2024-01-16 DIAGNOSIS — Z8673 Personal history of transient ischemic attack (TIA), and cerebral infarction without residual deficits: Secondary | ICD-10-CM

## 2024-01-16 DIAGNOSIS — I5032 Chronic diastolic (congestive) heart failure: Secondary | ICD-10-CM

## 2024-01-16 DIAGNOSIS — I351 Nonrheumatic aortic (valve) insufficiency: Secondary | ICD-10-CM

## 2024-01-16 NOTE — Patient Instructions (Signed)
Medication Instructions:  Your physician recommends that you continue on your current medications as directed. Please refer to the Current Medication list given to you today.  *If you need a refill on your cardiac medications before your next appointment, please call your pharmacy*  Lab Work: CMET, CBC-TODAY If you have labs (blood work) drawn today and your tests are completely normal, you will receive your results only by: MyChart Message (if you have MyChart) OR A paper copy in the mail If you have any lab test that is abnormal or we need to change your treatment, we will call you to review the results.  Testing/Procedures: Your physician has requested that you have an echocardiogram in August. Echocardiography is a painless test that uses sound waves to create images of your heart. It provides your doctor with information about the size and shape of your heart and how well your heart's chambers and valves are working. This procedure takes approximately one hour. There are no restrictions for this procedure. Please do NOT wear cologne, perfume, aftershave, or lotions (deodorant is allowed). Please arrive 15 minutes prior to your appointment time.  Please note: We ask at that you not bring children with you during ultrasound (echo/ vascular) testing. Due to room size and safety concerns, children are not allowed in the ultrasound rooms during exams. Our front office staff cannot provide observation of children in our lobby area while testing is being conducted. An adult accompanying a patient to their appointment will only be allowed in the ultrasound room at the discretion of the ultrasound technician under special circumstances. We apologize for any inconvenience.    Follow-Up: At [facility], you and your health needs are our priority.  As part of our continuing mission to provide you with exceptional heart care, we have created designated Provider Care Teams.  These Care Teams  include your primary Cardiologist (physician) and Advanced Practice Providers (APPs -  Physician Assistants and Nurse Practitioners) who all work together to provide you with the care you need, when you need it.   Your next appointment:   8 month(s)  Provider:   Dr Izora Ribas

## 2024-01-17 LAB — COMPREHENSIVE METABOLIC PANEL
ALT: 18 [IU]/L (ref 0–32)
AST: 23 [IU]/L (ref 0–40)
Albumin: 4 g/dL (ref 3.6–4.6)
Alkaline Phosphatase: 83 [IU]/L (ref 44–121)
BUN/Creatinine Ratio: 20 (ref 12–28)
BUN: 19 mg/dL (ref 10–36)
Bilirubin Total: 0.2 mg/dL (ref 0.0–1.2)
CO2: 25 mmol/L (ref 20–29)
Calcium: 9.1 mg/dL (ref 8.7–10.3)
Chloride: 100 mmol/L (ref 96–106)
Creatinine, Ser: 0.95 mg/dL (ref 0.57–1.00)
Globulin, Total: 3 g/dL (ref 1.5–4.5)
Glucose: 105 mg/dL — ABNORMAL HIGH (ref 70–99)
Potassium: 4.6 mmol/L (ref 3.5–5.2)
Sodium: 139 mmol/L (ref 134–144)
Total Protein: 7 g/dL (ref 6.0–8.5)
eGFR: 57 mL/min/{1.73_m2} — ABNORMAL LOW (ref >59)

## 2024-01-17 LAB — CBC
Hematocrit: 36.6 % (ref 34.0–46.6)
Hemoglobin: 11.2 g/dL (ref 11.1–15.9)
MCH: 25.2 pg — ABNORMAL LOW (ref 26.6–33.0)
MCHC: 30.6 g/dL — ABNORMAL LOW (ref 31.5–35.7)
MCV: 82 fL (ref 79–97)
Platelets: 165 10*3/uL (ref 150–450)
RBC: 4.45 x10E6/uL (ref 3.77–5.28)
RDW: 14 % (ref 11.7–15.4)
WBC: 4.6 10*3/uL (ref 3.4–10.8)

## 2024-01-29 ENCOUNTER — Other Ambulatory Visit: Payer: Self-pay | Admitting: Student

## 2024-02-21 ENCOUNTER — Other Ambulatory Visit: Payer: Self-pay | Admitting: Internal Medicine

## 2024-02-21 DIAGNOSIS — I480 Paroxysmal atrial fibrillation: Secondary | ICD-10-CM

## 2024-02-23 ENCOUNTER — Other Ambulatory Visit: Payer: Self-pay

## 2024-02-23 MED ORDER — METOPROLOL SUCCINATE ER 25 MG PO TB24: 25.0000 mg | ORAL_TABLET | Freq: Every day | ORAL | 3 refills | Status: AC

## 2024-02-23 NOTE — Telephone Encounter (Signed)
 Prescription refill request for Eliquis received. Indication:afib Last office visit:2/25 Scr:0.95  2/25 Age: 89 Weight:59.9  kg  Prescription refilled

## 2024-04-01 ENCOUNTER — Encounter: Payer: Self-pay | Admitting: Student

## 2024-04-01 ENCOUNTER — Ambulatory Visit (INDEPENDENT_AMBULATORY_CARE_PROVIDER_SITE_OTHER): Payer: Medicare (Managed Care) | Admitting: Student

## 2024-04-01 VITALS — BP 102/42 | HR 59

## 2024-04-01 DIAGNOSIS — G479 Sleep disorder, unspecified: Secondary | ICD-10-CM

## 2024-04-01 DIAGNOSIS — I5032 Chronic diastolic (congestive) heart failure: Secondary | ICD-10-CM | POA: Diagnosis not present

## 2024-04-01 DIAGNOSIS — I4891 Unspecified atrial fibrillation: Secondary | ICD-10-CM | POA: Diagnosis not present

## 2024-04-01 MED ORDER — MELATONIN 3 MG PO TABS: 3.0000 mg | ORAL_TABLET | Freq: Every day | ORAL | Status: AC

## 2024-04-01 NOTE — Assessment & Plan Note (Signed)
 Well-controlled. - Continue Eliquis  2.5 mg twice daily - Continue amiodarone  100 mg daily

## 2024-04-01 NOTE — Progress Notes (Signed)
    SUBJECTIVE:   CHIEF COMPLAINT / HPI:   CHF  atrial fibrillation Patient is doing well.  No new chest pain, palpitations, orthopnea, swelling.  Compliant with Toprol  25 mg daily, Lasix  40 mg daily-also taking potassium.  Recently had a c-Met/CBC checked electrolytes were WNL and renal function was stable.  She is compliant with her Eliquis  and amiodarone  100 mg daily.  DME needs 89 year old female with chronic diastolic heart failure, atrial fibrillation, essential tremor, debility who currently rents electric hospital bed.  Her hospital bed is necessary for positioning is not feasible in an ordinary bed.  Additionally with her heart failure she requires head of the bed to be elevated greater than 30 degrees.  For safety, patient needs bed rails to prevent falls.  Sleep disturbance Trouble falling asleep and staying asleep.  Not related to pain.  Not related to nightmares nor anxiety.  Reviewed sleep hygiene.  Recommend no screens 1 to 2 hours before bedtime.   OBJECTIVE:   BP (!) 102/42   Pulse (!) 59   SpO2 99%    General: NAD, pleasant Cardio: RRR, no MRG. Cap Refill <2s. Respiratory: CTAB, normal wob on RA Skin: Warm and dry  ASSESSMENT/PLAN:   Assessment & Plan Sleep disturbance Suspect a primary insomnia.  Discussed sleep hygiene. - Trial 3 mg melatonin 1-2 hrs before bedtime CHF (congestive heart failure), NYHA class III, chronic, diastolic (HCC) Well-controlled.  Patient does need renewal of her DME orders for hospital bed, documentation provided above and will fax to her DME company. - Continue Lasix  40 mg daily, potassium 20 mEq daily - Continue Toprol  25 mg daily Atrial fibrillation, unspecified type (HCC) Well-controlled. - Continue Eliquis  2.5 mg twice daily - Continue amiodarone  100 mg daily   Lavada Porteous, DO [facility]

## 2024-04-01 NOTE — Patient Instructions (Addendum)
 It was great to see you! Thank you for allowing me to participate in your care!   I recommend that you always bring your medications to each appointment as this makes it easy to ensure we are on the correct medications and helps us  not miss when refills are needed.  Our plans for today:  - Take 3 mg of melatonin 1-2 hours before bedtime to help with sleep. - We will send a note to your DME company for hospital bed  Take care and seek immediate care sooner if you develop any concerns. Please remember to show up 15 minutes before your scheduled appointment time!  Lavada Porteous, DO [facility]

## 2024-04-05 ENCOUNTER — Telehealth: Payer: Self-pay

## 2024-04-05 NOTE — Telephone Encounter (Signed)
 Patient's son calls office regarding request for assistance with oxygen re certification.   He provided me with contact number for Jessica at [employer]. [phone] ext: [phone])  [name] [name]. She states that oxygen re-certification form needs to be signed by provider. Advised that PCP is resident. Due to patient being enrolled with Medicare, attending will need to sign orders.   Provided with Dr. McDiarmid's information, as he is the advisor for Dr. Telford Feather.   Camilo Cella is faxing paperwork to our office now.   Elsie Halo, RN

## 2024-04-08 NOTE — Telephone Encounter (Signed)
 Form for O2 signed

## 2024-07-05 ENCOUNTER — Telehealth (HOSPITAL_COMMUNITY): Payer: Self-pay | Admitting: Nurse Practitioner

## 2024-07-05 NOTE — Telephone Encounter (Signed)
 Patient is out of the [place] and needed to cancel echocardiogram scheduled for 07/06/24. So will call back to reschedule when back in the state. Order will be removed from the active echo WQ. Thank you.

## 2024-07-06 ENCOUNTER — Ambulatory Visit (HOSPITAL_COMMUNITY): Payer: Medicare (Managed Care)

## 2024-07-15 ENCOUNTER — Other Ambulatory Visit: Payer: Self-pay | Admitting: Student

## 2024-07-15 ENCOUNTER — Other Ambulatory Visit: Payer: Self-pay | Admitting: Neurology

## 2024-07-15 DIAGNOSIS — E0842 Diabetes mellitus due to underlying condition with diabetic polyneuropathy: Secondary | ICD-10-CM

## 2024-07-27 ENCOUNTER — Other Ambulatory Visit: Payer: Self-pay

## 2024-07-27 MED ORDER — FUROSEMIDE 40 MG PO TABS: 40.0000 mg | ORAL_TABLET | Freq: Every day | ORAL | 1 refills | Status: AC

## 2024-09-07 ENCOUNTER — Telehealth: Payer: Self-pay | Admitting: Family Medicine

## 2024-09-07 NOTE — Telephone Encounter (Signed)
 Received notice from patient's DME company that they need updated OV notes pertaining to O2 need within the last 6 months (specifying if condition will improve with O2, planned course of treatment, results of a qualifying O2 test).   Admin team: can we schedule an appt with PCP for discussion? Thank you!

## 2024-09-09 ENCOUNTER — Ambulatory Visit: Payer: Medicare (Managed Care) | Admitting: Adult Health

## 2024-10-11 ENCOUNTER — Ambulatory Visit
Admission: RE | Admit: 2024-10-11 | Discharge: 2024-10-11 | Disposition: A | Discharge status: Home / Self Care | Payer: Medicare (Managed Care) | Source: Ambulatory Visit | Attending: Family Medicine | Admitting: Family Medicine

## 2024-10-11 ENCOUNTER — Ambulatory Visit: Payer: Self-pay | Admitting: Student

## 2024-10-11 ENCOUNTER — Ambulatory Visit: Payer: Medicare (Managed Care) | Admitting: Student

## 2024-10-11 VITALS — BP 124/66 | HR 69 | Ht 60.0 in | Wt 139.5 lb

## 2024-10-11 DIAGNOSIS — R0609 Other forms of dyspnea: Secondary | ICD-10-CM

## 2024-10-11 DIAGNOSIS — Z8669 Personal history of other diseases of the nervous system and sense organs: Secondary | ICD-10-CM

## 2024-10-11 DIAGNOSIS — H00012 Hordeolum externum right lower eyelid: Secondary | ICD-10-CM

## 2024-10-11 LAB — POCT GLYCOSYLATED HEMOGLOBIN (HGB A1C): Hemoglobin A1C: 5.9 % — AB (ref 4.0–5.6)

## 2024-10-11 NOTE — Assessment & Plan Note (Signed)
 Suspect this is her chronic peripheral neuropathy.  She was discontinued on gabapentin  by neurology.  Low concern for PAD given exam is benign and history inconsistent with claudication.  After discussion, we will not pursue more aggressive therapy at this time.

## 2024-10-11 NOTE — Progress Notes (Signed)
/     SUBJECTIVE:   CHIEF COMPLAINT / HPI:   Right eye pain Right eye pain began 2 weeks ago.  Pain of right lower eyelid.  Minimal discharge.  No crusting.  No change in vision.  DME oxygen  exertional dyspnea Patient with intermittent use of home oxygen.  Home oxygen began after previous hospitalization with pleural effusions and pulmonary embolism.  She uses her oxygen intermittently.  Son will help her put oxygen on typically when she looks like she is having increased work of breathing.  They are not routinely checking her oxygen saturation.  Chest x-ray in July 08, 2023 suggestive of COPD. Today son reports that patient has ongoing exertional dyspnea, worse after walking long distances.   History of peripheral neuropathy Extensive history of peripheral neuropathy.  Previously seen by neurology, he believes related to diabetes.  Her neuropathy is worse in her right foot, particular with her toes.  Pain is primarily with ambulation.   OBJECTIVE:   BP 124/66   Pulse 69   Ht 5' (1.524 m)   Wt 139 lb 8 oz (63.3 kg)   SpO2 93%   BMI 27.24 kg/m    General: NAD, pleasant HEENT: Mild swelling over lower eyelid on right, consistent with stye Cardio: RRR, no MRG. Cap Refill <2s. Respiratory: Coarse breath sides bilaterally, no wheezing, no focal findings. Normal wob on RA Right foot: No gross deformity, no ecchymosis, no wounds.  Mild TTP to toes.  5/5 strength, gross sensation intact.  Strong pedal pulse, capillary refill less than 2 seconds.  ASSESSMENT/PLAN:   Assessment & Plan History of peripheral neuropathy Suspect this is her chronic peripheral neuropathy.  She was discontinued on gabapentin  by neurology.  Low concern for PAD given exam is benign and history inconsistent with claudication.  After discussion, we will not pursue more aggressive therapy at this time. Exertional dyspnea Differential:  COPD, pneumonia, worsening pleural effusion (previous history), CHF.  SpO2  drops to 86% with ambulation in office today, resolved to 93% at rest.  She is asymptomatic at rest.  She is adherent to Eliquis . - Obtain chest x-ray - Renew home oxygen - Could consider maintenance inhalers - Would likely defer extensive testing given advanced age, will discuss with son after chest x-ray is obtained to rule out significant pathology Hordeolum externum of right lower eyelid -Warm compresses   Gladis Church, DO [facility] Health [facility]

## 2024-10-11 NOTE — Patient Instructions (Signed)
 It was great to see you! Thank you for allowing me to participate in your care!   I recommend that you always bring your medications to each appointment as this makes it easy to ensure we are on the correct medications and helps us  not miss when refills are needed.  Our plans for today:  -We are checking some labs today, I will call you if they are abnormal will send you a MyChart message or a letter if they are normal.  If you do not hear about your labs in the next 2 weeks please let us  know.  An x-ray was ordered for you---you do not need an appointment to have this completed.  I recommend going to [facility] [address] [place]  If the results are normal,I will send you a letter  I will call you with results if anything is abnormal     Take care and seek immediate care sooner if you develop any concerns. Please remember to show up 15 minutes before your scheduled appointment time!  Gladis Church, DO [facility]

## 2024-10-15 ENCOUNTER — Telehealth: Payer: Self-pay | Admitting: Student

## 2024-10-15 NOTE — Telephone Encounter (Signed)
 Called x2, no answer. Per DPR information, ok to leave detailed message.  Reviewed CXR. Recommend doubling home lasix  for 3 days. Recommend 80 mg daily for 3days, then back to 40 mg daily after that. Recommend follow-up in 1 week.

## 2024-10-19 NOTE — Telephone Encounter (Signed)
 Patient's son returns call to nurse line.   Advised of message per Dr. Howell.   He never received prior message from provider. Patient with double lasix  for 3 days, scheduled for follow up with Dr. Howell on Monday.   Per son, patient is not having any shortness of breath or chest pain.   Discussed patient with Dr. Howell who agreed with plan.   [name] JAYSON English, RN

## 2024-10-25 ENCOUNTER — Ambulatory Visit: Payer: Medicare (Managed Care) | Admitting: Student

## 2024-10-25 VITALS — BP 115/75 | HR 69 | Wt 140.6 lb

## 2024-10-25 DIAGNOSIS — R0609 Other forms of dyspnea: Secondary | ICD-10-CM | POA: Diagnosis not present

## 2024-10-25 DIAGNOSIS — R1310 Dysphagia, unspecified: Secondary | ICD-10-CM | POA: Diagnosis not present

## 2024-10-25 DIAGNOSIS — G479 Sleep disorder, unspecified: Secondary | ICD-10-CM | POA: Diagnosis not present

## 2024-10-25 MED ORDER — DOXEPIN HCL 3 MG PO TABS: 3.0000 mg | ORAL_TABLET | Freq: Every evening | ORAL | 1 refills | Status: DC

## 2024-10-25 NOTE — Patient Instructions (Addendum)
 It was great to see you! Thank you for allowing me to participate in your care!   I recommend that you always bring your medications to each appointment as this makes it easy to ensure we are on the correct medications and helps us  not miss when refills are needed.  Our plans for today:  - Take 40 mg Lasix  (furosemide ) daily - Take 3 mg doxepin before bedtime - You will receive a call from speech-language pathology for evaluation of swallowing    Take care and seek immediate care sooner if you develop any concerns. Please remember to show up 15 minutes before your scheduled appointment time!  Gladis Church, DO [facility]

## 2024-10-25 NOTE — Progress Notes (Signed)
    SUBJECTIVE:   CHIEF COMPLAINT / HPI:   Dyspnea Follow-up for dyspnea.  Son reports he doubled up on his mothers Lasix .  However on further discussion, it appears patient is only taking 20 mg instead of 40 mg daily.  So when he doubled up he went to 40 mg daily instead of 80 mg daily.  Despite this dose error, patient has been feeling better with increased Lasix .  Improved cough.  Dysphagia Son thinks mother is choking when she is drinking water, and concerned she may be aspirating.  Sleep disturbance Melatonin has not improved sleep onset/duration.  Requesting different pharmacological medication.  Additionally extensively discussed about sleep hygiene.   OBJECTIVE:   BP 115/75   Pulse 69   Wt 140 lb 9.6 oz (63.8 kg)   SpO2 96%   BMI 27.46 kg/m    General: NAD, pleasant, Cardio: RRR, no MRG. Cap Refill <2s.  No edema. Respiratory: CTAB, normal wob on RA Skin: Warm and dry  ASSESSMENT/PLAN:   Assessment & Plan Dysphagia, unspecified type - Outpatient referral to speech for evaluation and swallow study Sleep disturbance - Doxepin 3 mg nightly - Sleep hygiene Exertional dyspnea - Currently resolved with increased Lasix  dosing - Continue 40 mg Lasix  daily, and other current meds  Gladis Church, DO [facility]

## 2024-10-26 ENCOUNTER — Telehealth: Payer: Self-pay

## 2024-10-26 NOTE — Telephone Encounter (Signed)
 Patient's son LVM on nurse line regarding dosage of Lasix .   Son reports in VM that patient currently has supply of 20 mg Lasix .   Attempted to call son back to determine what was needed from voice mail. He did not answer, LVM requesting returned call.   Chiquita JAYSON English, RN

## 2024-11-12 ENCOUNTER — Ambulatory Visit: Payer: Medicare (Managed Care) | Attending: Internal Medicine | Admitting: Internal Medicine

## 2024-11-12 VITALS — BP 125/50 | HR 59 | Ht 60.0 in | Wt 142.2 lb

## 2024-11-12 DIAGNOSIS — I10 Essential (primary) hypertension: Secondary | ICD-10-CM

## 2024-11-12 DIAGNOSIS — I5032 Chronic diastolic (congestive) heart failure: Secondary | ICD-10-CM

## 2024-11-12 DIAGNOSIS — I480 Paroxysmal atrial fibrillation: Secondary | ICD-10-CM | POA: Diagnosis not present

## 2024-11-12 NOTE — Progress Notes (Signed)
 " Cardiology Office Note:  .    Date:  11/12/2024  ID:  Louretta KATHEE Landsman, DOB [birth year], MRN [id number] PCP: Howell Lunger, DO  [facility] Providers Cardiologist:  None     CC: Valve disease and AF follow up.  History of Present Illness: .    KIRSTY MONJARAZ is a 89 y.o. female  with hypertension, atrial fibrillation, and heart failure who presents for a cardiovascular follow-up.  She experiences shortness of breath during exertion but is comfortable at rest, particularly when sitting. No heart palpitations or irregular heartbeats are noted at this time. There is no increase in swelling as reported by the patient.  Her medical history includes hypertension, atrial fibrillation managed with amiodarone  100 mg daily, and heart failure with preserved ejection fraction. She also has a history of stroke and deep vein thrombosis. Her current medications include Lasix  40 mg daily, metoprolol  25 mg daily, potassium 20 mEq daily, and Eliquis  for anticoagulation.  An echocardiogram performed in 2024 showed mild tricuspid regurgitation and mild aortic regurgitation.  Relevant histories: .  Social- her son is seen at the practice (AT), former [facility] PT 2024: Established with me; TR improved ROS: As per HPI.   Studies Reviewed: .   Cardiac Studies & Procedures   ______________________________________________________________________________________________     ECHOCARDIOGRAM  ECHOCARDIOGRAM COMPLETE 09/17/2023  Narrative ECHOCARDIOGRAM REPORT    Patient Name:   ARLYSS WEATHERSBY Date of Exam: 09/17/2023 Medical Rec #:  [id number]        Height:       60.0 in Accession #:    [id number]       Weight:       131.0 lb Date of Birth:  [birth year]       BSA:          1.559 m Patient Age:    89 years         BP:           114/84 mmHg Patient Gender: F                HR:           52 bpm. Exam Location:  [address]  Procedure: 2D Echo, Cardiac Doppler and Color  Doppler  Indications:    I35.1 Aortic Valve Insufficiency  History:        Patient has prior history of Echocardiogram examinations, most recent 09/17/2022. Stroke; Risk Factors:Hypertension and Diabetes.  Sonographer:    Carl Coma RDCS Referring Phys: [id number] [facility] A Alexzandrea Normington  IMPRESSIONS   1. Left ventricular ejection fraction, by estimation, is >75%. The left ventricle has hyperdynamic function. The left ventricle has no regional wall motion abnormalities. Left ventricular diastolic parameters are consistent with Grade I diastolic dysfunction (impaired relaxation). 2. Right ventricular systolic function is normal. The right ventricular size is normal. 3. Left atrial size was moderately dilated. 4. The mitral valve is grossly normal. Trivial mitral valve regurgitation. 5. The aortic valve is tricuspid. Aortic valve regurgitation is mild. Aortic valve sclerosis is present, with no evidence of aortic valve stenosis. 6. The inferior vena cava is normal in size with greater than 50% respiratory variability, suggesting right atrial pressure of 3 mmHg.  Comparison(s): Changes from prior study are noted. 09/17/2022: LVEF 55-60%, mild AI.  FINDINGS Left Ventricle: Left ventricular ejection fraction, by estimation, is >75%. The left ventricle has hyperdynamic function. The left ventricle has no regional wall motion abnormalities. The left ventricular internal cavity size was normal in  size. There is no left ventricular hypertrophy. Left ventricular diastolic parameters are consistent with Grade I diastolic dysfunction (impaired relaxation). Indeterminate filling pressures.  Right Ventricle: The right ventricular size is normal. No increase in right ventricular wall thickness. Right ventricular systolic function is normal.  Left Atrium: Left atrial size was moderately dilated.  Right Atrium: Right atrial size was normal in size.  Pericardium: There is no evidence of  pericardial effusion.  Mitral Valve: The mitral valve is grossly normal. Trivial mitral valve regurgitation.  Tricuspid Valve: The tricuspid valve is grossly normal. Tricuspid valve regurgitation is mild.  Aortic Valve: The aortic valve is tricuspid. Aortic valve regurgitation is mild. Aortic regurgitation PHT measures 609 msec. Aortic valve sclerosis is present, with no evidence of aortic valve stenosis.  Pulmonic Valve: The pulmonic valve was normal in structure. Pulmonic valve regurgitation is not visualized.  Aorta: The aortic root and ascending aorta are structurally normal, with no evidence of dilitation.  Venous: The inferior vena cava is normal in size with greater than 50% respiratory variability, suggesting right atrial pressure of 3 mmHg.  IAS/Shunts: No atrial level shunt detected by color flow Doppler.   LEFT VENTRICLE PLAX 2D LVIDd:         4.20 cm   Diastology LVIDs:         2.30 cm   LV e' medial:    5.55 cm/s LV PW:         0.90 cm   LV E/e' medial:  15.3 LV IVS:        0.90 cm   LV e' lateral:   5.33 cm/s LVOT diam:     2.00 cm   LV E/e' lateral: 15.9 LV SV:         91 LV SV Index:   59 LVOT Area:     3.14 cm   RIGHT VENTRICLE             IVC RV Basal diam:  3.50 cm     IVC diam: 1.30 cm RV S prime:     15.00 cm/s TAPSE (M-mode): 2.5 cm  LEFT ATRIUM             Index        RIGHT ATRIUM          Index LA diam:        5.10 cm 3.27 cm/m   RA Area:     9.93 cm LA Vol (A2C):   58.1 ml 37.26 ml/m  RA Volume:   19.10 ml 12.25 ml/m LA Vol (A4C):   67.1 ml 43.03 ml/m LA Biplane Vol: 62.1 ml 39.82 ml/m AORTIC VALVE LVOT Vmax:   118.00 cm/s LVOT Vmean:  74.200 cm/s LVOT VTI:    0.291 m AI PHT:      609 msec  AORTA Ao Root diam: 3.10 cm Ao Asc diam:  3.70 cm  MITRAL VALVE MV Area (PHT): 3.27 cm    SHUNTS MV Decel Time: 232 msec    Systemic VTI:  0.29 m MV E velocity: 84.90 cm/s  Systemic Diam: 2.00 cm MV A velocity: 77.80 cm/s MV E/A ratio:   1.09  Vinie Maxcy MD Electronically signed by Vinie Maxcy MD Signature Date/Time: 09/17/2023/3:43:41 PM    Final   TEE  ECHO TEE 09/24/2022  Narrative TRANSESOPHOGEAL ECHO REPORT    Patient Name:   TRENIKA HUDSON Date of Exam: 09/24/2022 Medical Rec #:  [id number]        Height:  60.0 in Accession #:    [id number]       Weight:       158.5 lb Date of Birth:  Feb 28, 1930       BSA:          1.691 m Patient Age:    89 years         BP:           111/68 mmHg Patient Gender: F                HR:           118 bpm. Exam Location:  Inpatient  Procedure: Transesophageal Echo, Cardiac Doppler and Color Doppler  Indications:     atrial fibrillation  History:         Patient has prior history of Echocardiogram examinations, most recent 09/17/2022. CHF; Risk Factors:Hypertension, Dyslipidemia and Diabetes.  Sonographer:     Tinnie Barefoot RDCS Referring Phys:  [id number] SALENA NEGRI Diagnosing Phys: Salena Negri MD  PROCEDURE: After discussion of the risks and benefits of a TEE, an informed consent was obtained from the patient. The transesophogeal probe was passed without difficulty through the esophogus of the patient. Imaged were obtained with the patient in a left lateral decubitus position. Sedation performed by performing physician. Patients was under conscious sedation during this procedure., 2.5mg  of Versed . The patient developed no complications during the procedure.  IMPRESSIONS   1. Left ventricular ejection fraction, by estimation, is 55 to 60%. The left ventricle has normal function. The left ventricle has no regional wall motion abnormalities. Left ventricular diastolic parameters are indeterminate. 2. Right ventricular systolic function is low normal. The right ventricular size is normal. Mildly increased right ventricular wall thickness. 3. Left atrial size was moderately dilated. No left atrial/left atrial appendage thrombus was detected. 4. Right atrial  size was mildly dilated. 5. There is no evidence of cardiac tamponade. 6. The mitral valve is degenerative. Mild mitral valve regurgitation. 7. The aortic valve is tricuspid. There is mild calcification of the aortic valve. There is mild thickening of the aortic valve. Aortic valve regurgitation is mild. 8. Agitated saline contrast bubble study was negative, with no evidence of any interatrial shunt.  FINDINGS Left Ventricle: Left ventricular ejection fraction, by estimation, is 55 to 60%. The left ventricle has normal function. The left ventricle has no regional wall motion abnormalities. The left ventricular internal cavity size was normal in size. There is borderline concentric left ventricular hypertrophy. Left ventricular diastolic parameters are indeterminate.  Right Ventricle: The right ventricular size is normal. Mildly increased right ventricular wall thickness. Right ventricular systolic function is low normal.  Left Atrium: Left atrial size was moderately dilated. No left atrial/left atrial appendage thrombus was detected.  Right Atrium: Right atrial size was mildly dilated.  Pericardium: Trivial pericardial effusion is present. The pericardial effusion is circumferential. There is no evidence of cardiac tamponade.  Mitral Valve: The mitral valve is degenerative in appearance. Mild mitral valve regurgitation. There is no evidence of mitral valve vegetation.  Tricuspid Valve: The tricuspid valve is normal in structure. Tricuspid valve regurgitation is mild. There is no evidence of tricuspid valve vegetation.  Aortic Valve: The aortic valve is tricuspid. There is mild calcification of the aortic valve. There is mild thickening of the aortic valve. There is mild aortic valve annular calcification. Aortic valve regurgitation is mild. There is no evidence of aortic valve vegetation.  Pulmonic Valve: The pulmonic valve was normal in structure. Pulmonic valve  regurgitation is trivial.  There is no evidence of pulmonic valve vegetation.  Aorta: The aortic root is normal in size and structure.  Venous: The left upper pulmonary vein, left lower pulmonary vein, right upper pulmonary vein and right lower pulmonary vein are normal. The inferior vena cava was not well visualized.  IAS/Shunts: No atrial level shunt detected by color flow Doppler. Agitated saline contrast was given intravenously to evaluate for intracardiac shunting. Agitated saline contrast bubble study was negative, with no evidence of any interatrial shunt.  Salena Negri MD Electronically signed by Salena Negri MD Signature Date/Time: 09/24/2022/9:54:04 AM    Final        ______________________________________________________________________________________________       Physical Exam:    VS:  BP (!) 125/50 (BP Location: Left Arm)   Pulse (!) 59   Ht 5' (1.524 m)   Wt 64.5 kg   SpO2 96%   BMI 27.77 kg/m    Wt Readings from Last 3 Encounters:  11/12/24 64.5 kg  10/25/24 63.8 kg  10/11/24 63.3 kg    Gen: No distress, elderly female essential tremor Neck: No JVD Cardiac: No Rubs or Gallops, systolic and diastolic murmurs, regular bradycardia, +2 radial pulses Respiratory: Clear to auscultation bilaterally, norma effort, normal  respiratory rate GI: Soft, nontender, non-distended  MS: No edema;  moves all extremities Integument: Skin feels warm Neuro:  At time of evaluation, alert and oriented to person/place/time/situation  Psych: Normal affect, patient feels well   ASSESSMENT AND PLAN: .    Mild aortic regurgitation Mild tricuspid regurgitation Chronic Heart failure with preserved ejection fraction (NYHA III, Euvolemic) Managed with Lasix   No worsening of symptoms. No fluid overload noted on examination. Higher dose of medication appears effective (was on 20 mg). 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 The visit required a comprehensive assessment and management plan tailored to the patient's unique needs Time was spent addressing not only the acute concerns but also the broader context of the patient's health, including preventive care, chronic disease management, and care coordination as appropriate.  Complex longitudinal is necessary for conditions including: Largely planning for only medical management of HF and AF with minimal symptoms but decreased mobility; discussed with patient and son who are in agreement; patient is about to be 65   Stanly Leavens, MD FASE [facility] Cardiologist [facility]  [address] [place], [address] [place], [place] [address] [phone]  4:47 PM  "

## 2024-11-12 NOTE — Patient Instructions (Signed)
 Medication Instructions:  Your physician recommends that you continue on your current medications as directed. Please refer to the Current Medication list given to you today.  *If you need a refill on your cardiac medications before your next appointment, please call your pharmacy*  Lab Work: CBC and CMP at [facility]  If you have labs (blood work) drawn today and your tests are completely normal, you will receive your results only by: MyChart Message (if you have MyChart) OR A paper copy in the mail If you have any lab test that is abnormal or we need to change your treatment, we will call you to review the results.  Testing/Procedures: NONE  Follow-Up: At [facility], you and your health needs are our priority.  As part of our continuing mission to provide you with exceptional heart care, our providers are all part of one team.  This team includes your primary Cardiologist (physician) and Advanced Practice Providers or APPs (Physician Assistants and Nurse Practitioners) who all work together to provide you with the care you need, when you need it.  Your next appointment:   1 year(s)  Provider:   One of our Advanced Practice Providers (APPs): Morse Clause, PA-C  Lamarr Satterfield, NP Miriam Shams, NP  Olivia Pavy, PA-C Josefa Beauvais, NP  Leontine Salen, PA-C Orren Fabry, PA-C  [place], PA-C Ernest Dick, NP  Damien Braver, NP Jon Hails, PA-C  Waddell Donath, PA-C    Dayna Dunn, PA-C  Scott Weaver, PA-C Lum Louis, NP Katlyn West, NP Callie Goodrich, PA-C  Xika Zhao, NP Sheng Haley, PA-C    Kathleen Johnson, PA-C

## 2024-11-13 LAB — CBC
Hematocrit: 35.5 % (ref 34.0–46.6)
Hemoglobin: 10.5 g/dL — ABNORMAL LOW (ref 11.1–15.9)
MCH: 23.5 pg — ABNORMAL LOW (ref 26.6–33.0)
MCHC: 29.6 g/dL — ABNORMAL LOW (ref 31.5–35.7)
MCV: 79 fL (ref 79–97)
Platelets: 173 x10E3/uL (ref 150–450)
RBC: 4.47 x10E6/uL (ref 3.77–5.28)
RDW: 15.5 % — ABNORMAL HIGH (ref 11.7–15.4)
WBC: 5.1 x10E3/uL (ref 3.4–10.8)

## 2024-11-13 LAB — COMPREHENSIVE METABOLIC PANEL WITH GFR
ALT: 17 IU/L (ref 0–32)
AST: 30 IU/L (ref 0–40)
Albumin: 4 g/dL (ref 3.6–4.6)
Alkaline Phosphatase: 111 IU/L (ref 48–129)
BUN/Creatinine Ratio: 22 (ref 12–28)
BUN: 24 mg/dL (ref 10–36)
Bilirubin Total: 0.3 mg/dL (ref 0.0–1.2)
CO2: 28 mmol/L (ref 20–29)
Calcium: 8.8 mg/dL (ref 8.7–10.3)
Chloride: 101 mmol/L (ref 96–106)
Creatinine, Ser: 1.09 mg/dL — AB (ref 0.57–1.00)
Globulin, Total: 2.5 g/dL (ref 1.5–4.5)
Glucose: 96 mg/dL (ref 70–99)
Potassium: 4.6 mmol/L (ref 3.5–5.2)
Sodium: 140 mmol/L (ref 134–144)
Total Protein: 6.5 g/dL (ref 6.0–8.5)
eGFR: 48 mL/min/1.73 — AB

## 2024-11-16 ENCOUNTER — Ambulatory Visit: Payer: Self-pay

## 2024-11-16 NOTE — Telephone Encounter (Signed)
-----   Message from Stanly Leavens, MD sent at 11/13/2024 10:26 AM EST ----- Regarding: FW: TSH pending- anemia is stable and LFTs are ok. No changes to AF therapy at this time

## 2024-11-16 NOTE — Telephone Encounter (Signed)
 Called [facility] had TSH added to testing.

## 2024-12-08 LAB — TSH: TSH: 1.31 u[IU]/mL (ref 0.450–4.500)

## 2024-12-08 LAB — SPECIMEN STATUS REPORT

## 2024-12-23 ENCOUNTER — Other Ambulatory Visit: Payer: Self-pay | Admitting: Student

## 2024-12-23 ENCOUNTER — Other Ambulatory Visit: Payer: Self-pay

## 2024-12-23 DIAGNOSIS — G40109 Localization-related (focal) (partial) symptomatic epilepsy and epileptic syndromes with simple partial seizures, not intractable, without status epilepticus: Secondary | ICD-10-CM

## 2024-12-23 DIAGNOSIS — G479 Sleep disorder, unspecified: Secondary | ICD-10-CM

## 2024-12-23 MED ORDER — LEVETIRACETAM 500 MG PO TABS: 500.0000 mg | ORAL_TABLET | Freq: Two times a day (BID) | ORAL | 0 refills | Status: AC

## 2024-12-23 NOTE — Telephone Encounter (Signed)
 Patient's son calls nurse line requesting refill on Keppra .   Advised that we have not refilled this prescription for several years. Son reports that cardiology office advised that they needed to get refill from PCP office.   Patient no longer has neurologist.   Forwarding request to Dr. Howell.   Chiquita JAYSON English, RN

## 2024-12-24 ENCOUNTER — Ambulatory Visit: Payer: Self-pay | Admitting: Student

## 2025-01-03 ENCOUNTER — Ambulatory Visit: Payer: Medicare (Managed Care) | Admitting: Student

## 2025-02-14 ENCOUNTER — Ambulatory Visit: Payer: Medicare (Managed Care)
